# Patient Record
Sex: Female | Born: 1950 | Race: Black or African American | Hispanic: No | Smoking: Former smoker
Health system: Southern US, Community
[De-identification: ages and names within clinical notes are randomized; demographics above are authoritative.]

## PROBLEM LIST (undated history)

## (undated) DIAGNOSIS — J189 Pneumonia, unspecified organism: Secondary | ICD-10-CM

## (undated) DIAGNOSIS — N186 End stage renal disease: Secondary | ICD-10-CM

## (undated) DIAGNOSIS — Z9289 Personal history of other medical treatment: Secondary | ICD-10-CM

## (undated) DIAGNOSIS — D649 Anemia, unspecified: Secondary | ICD-10-CM

## (undated) DIAGNOSIS — N184 Chronic kidney disease, stage 4 (severe): Secondary | ICD-10-CM

## (undated) DIAGNOSIS — K219 Gastro-esophageal reflux disease without esophagitis: Secondary | ICD-10-CM

## (undated) DIAGNOSIS — J449 Chronic obstructive pulmonary disease, unspecified: Secondary | ICD-10-CM

## (undated) DIAGNOSIS — I10 Essential (primary) hypertension: Secondary | ICD-10-CM

## (undated) DIAGNOSIS — M069 Rheumatoid arthritis, unspecified: Secondary | ICD-10-CM

## (undated) HISTORY — PX: VOLVULUS REDUCTION: SHX425

---

## 2017-12-12 DIAGNOSIS — R7989 Other specified abnormal findings of blood chemistry: Secondary | ICD-10-CM | POA: Diagnosis not present

## 2017-12-12 DIAGNOSIS — D649 Anemia, unspecified: Secondary | ICD-10-CM | POA: Diagnosis not present

## 2017-12-12 DIAGNOSIS — Z6822 Body mass index (BMI) 22.0-22.9, adult: Secondary | ICD-10-CM | POA: Diagnosis not present

## 2017-12-12 DIAGNOSIS — M47812 Spondylosis without myelopathy or radiculopathy, cervical region: Secondary | ICD-10-CM | POA: Diagnosis not present

## 2017-12-12 DIAGNOSIS — D691 Qualitative platelet defects: Secondary | ICD-10-CM | POA: Diagnosis not present

## 2017-12-12 DIAGNOSIS — I10 Essential (primary) hypertension: Secondary | ICD-10-CM | POA: Diagnosis not present

## 2017-12-12 DIAGNOSIS — E782 Mixed hyperlipidemia: Secondary | ICD-10-CM | POA: Diagnosis not present

## 2017-12-12 DIAGNOSIS — E611 Iron deficiency: Secondary | ICD-10-CM | POA: Diagnosis not present

## 2017-12-29 DIAGNOSIS — Z6823 Body mass index (BMI) 23.0-23.9, adult: Secondary | ICD-10-CM | POA: Diagnosis not present

## 2017-12-29 DIAGNOSIS — M47812 Spondylosis without myelopathy or radiculopathy, cervical region: Secondary | ICD-10-CM | POA: Diagnosis not present

## 2017-12-29 DIAGNOSIS — I10 Essential (primary) hypertension: Secondary | ICD-10-CM | POA: Diagnosis not present

## 2017-12-29 DIAGNOSIS — M542 Cervicalgia: Secondary | ICD-10-CM | POA: Diagnosis not present

## 2018-01-06 DIAGNOSIS — K8020 Calculus of gallbladder without cholecystitis without obstruction: Secondary | ICD-10-CM | POA: Diagnosis not present

## 2018-01-06 DIAGNOSIS — R945 Abnormal results of liver function studies: Secondary | ICD-10-CM | POA: Diagnosis not present

## 2018-01-06 DIAGNOSIS — N289 Disorder of kidney and ureter, unspecified: Secondary | ICD-10-CM | POA: Diagnosis not present

## 2018-01-12 DIAGNOSIS — E782 Mixed hyperlipidemia: Secondary | ICD-10-CM | POA: Diagnosis not present

## 2018-01-12 DIAGNOSIS — D649 Anemia, unspecified: Secondary | ICD-10-CM | POA: Diagnosis not present

## 2018-01-12 DIAGNOSIS — I10 Essential (primary) hypertension: Secondary | ICD-10-CM | POA: Diagnosis not present

## 2018-01-13 DIAGNOSIS — N281 Cyst of kidney, acquired: Secondary | ICD-10-CM | POA: Diagnosis not present

## 2018-01-13 DIAGNOSIS — N200 Calculus of kidney: Secondary | ICD-10-CM | POA: Diagnosis not present

## 2018-01-13 DIAGNOSIS — N289 Disorder of kidney and ureter, unspecified: Secondary | ICD-10-CM | POA: Diagnosis not present

## 2018-06-14 DIAGNOSIS — I10 Essential (primary) hypertension: Secondary | ICD-10-CM | POA: Diagnosis not present

## 2018-06-14 DIAGNOSIS — N281 Cyst of kidney, acquired: Secondary | ICD-10-CM | POA: Diagnosis not present

## 2018-06-14 DIAGNOSIS — Z6822 Body mass index (BMI) 22.0-22.9, adult: Secondary | ICD-10-CM | POA: Diagnosis not present

## 2018-06-14 DIAGNOSIS — D649 Anemia, unspecified: Secondary | ICD-10-CM | POA: Diagnosis not present

## 2018-06-14 DIAGNOSIS — Z0001 Encounter for general adult medical examination with abnormal findings: Secondary | ICD-10-CM | POA: Diagnosis not present

## 2018-06-14 DIAGNOSIS — Z1212 Encounter for screening for malignant neoplasm of rectum: Secondary | ICD-10-CM | POA: Diagnosis not present

## 2018-06-14 DIAGNOSIS — M47812 Spondylosis without myelopathy or radiculopathy, cervical region: Secondary | ICD-10-CM | POA: Diagnosis not present

## 2018-06-29 DIAGNOSIS — R921 Mammographic calcification found on diagnostic imaging of breast: Secondary | ICD-10-CM | POA: Diagnosis not present

## 2018-06-29 DIAGNOSIS — Z1231 Encounter for screening mammogram for malignant neoplasm of breast: Secondary | ICD-10-CM | POA: Diagnosis not present

## 2018-08-09 DIAGNOSIS — R921 Mammographic calcification found on diagnostic imaging of breast: Secondary | ICD-10-CM | POA: Diagnosis not present

## 2018-08-23 DIAGNOSIS — E7800 Pure hypercholesterolemia, unspecified: Secondary | ICD-10-CM | POA: Diagnosis not present

## 2018-11-01 DIAGNOSIS — E860 Dehydration: Secondary | ICD-10-CM | POA: Diagnosis not present

## 2018-11-01 DIAGNOSIS — A045 Campylobacter enteritis: Secondary | ICD-10-CM | POA: Diagnosis not present

## 2018-11-01 DIAGNOSIS — R197 Diarrhea, unspecified: Secondary | ICD-10-CM | POA: Diagnosis not present

## 2018-11-01 DIAGNOSIS — N200 Calculus of kidney: Secondary | ICD-10-CM | POA: Diagnosis not present

## 2018-11-01 DIAGNOSIS — D649 Anemia, unspecified: Secondary | ICD-10-CM | POA: Diagnosis not present

## 2018-11-01 DIAGNOSIS — D473 Essential (hemorrhagic) thrombocythemia: Secondary | ICD-10-CM | POA: Diagnosis not present

## 2018-11-01 DIAGNOSIS — M545 Low back pain: Secondary | ICD-10-CM | POA: Diagnosis not present

## 2018-11-01 DIAGNOSIS — D72829 Elevated white blood cell count, unspecified: Secondary | ICD-10-CM | POA: Diagnosis not present

## 2018-11-01 DIAGNOSIS — A0472 Enterocolitis due to Clostridium difficile, not specified as recurrent: Secondary | ICD-10-CM | POA: Diagnosis not present

## 2018-11-01 DIAGNOSIS — Z87891 Personal history of nicotine dependence: Secondary | ICD-10-CM | POA: Diagnosis not present

## 2018-11-01 DIAGNOSIS — N281 Cyst of kidney, acquired: Secondary | ICD-10-CM | POA: Diagnosis not present

## 2018-11-01 DIAGNOSIS — M19041 Primary osteoarthritis, right hand: Secondary | ICD-10-CM | POA: Diagnosis not present

## 2018-11-01 DIAGNOSIS — I10 Essential (primary) hypertension: Secondary | ICD-10-CM | POA: Diagnosis not present

## 2018-11-01 DIAGNOSIS — M19042 Primary osteoarthritis, left hand: Secondary | ICD-10-CM | POA: Diagnosis not present

## 2018-11-01 DIAGNOSIS — R748 Abnormal levels of other serum enzymes: Secondary | ICD-10-CM | POA: Diagnosis not present

## 2018-11-01 DIAGNOSIS — E785 Hyperlipidemia, unspecified: Secondary | ICD-10-CM | POA: Diagnosis not present

## 2018-11-02 DIAGNOSIS — A0472 Enterocolitis due to Clostridium difficile, not specified as recurrent: Secondary | ICD-10-CM | POA: Diagnosis not present

## 2018-11-02 DIAGNOSIS — D72829 Elevated white blood cell count, unspecified: Secondary | ICD-10-CM | POA: Diagnosis not present

## 2018-11-03 DIAGNOSIS — A0472 Enterocolitis due to Clostridium difficile, not specified as recurrent: Secondary | ICD-10-CM | POA: Diagnosis not present

## 2018-11-03 DIAGNOSIS — D72829 Elevated white blood cell count, unspecified: Secondary | ICD-10-CM | POA: Diagnosis not present

## 2018-11-15 DIAGNOSIS — D638 Anemia in other chronic diseases classified elsewhere: Secondary | ICD-10-CM | POA: Diagnosis not present

## 2018-11-15 DIAGNOSIS — A0472 Enterocolitis due to Clostridium difficile, not specified as recurrent: Secondary | ICD-10-CM | POA: Diagnosis not present

## 2018-11-15 DIAGNOSIS — Z6820 Body mass index (BMI) 20.0-20.9, adult: Secondary | ICD-10-CM | POA: Diagnosis not present

## 2018-11-24 DIAGNOSIS — R7989 Other specified abnormal findings of blood chemistry: Secondary | ICD-10-CM | POA: Insufficient documentation

## 2018-11-24 DIAGNOSIS — M1990 Unspecified osteoarthritis, unspecified site: Secondary | ICD-10-CM | POA: Diagnosis not present

## 2018-11-24 DIAGNOSIS — D649 Anemia, unspecified: Secondary | ICD-10-CM | POA: Diagnosis not present

## 2018-11-24 DIAGNOSIS — D473 Essential (hemorrhagic) thrombocythemia: Secondary | ICD-10-CM | POA: Diagnosis not present

## 2018-11-24 DIAGNOSIS — D75839 Thrombocytosis, unspecified: Secondary | ICD-10-CM | POA: Insufficient documentation

## 2018-12-03 DIAGNOSIS — A0472 Enterocolitis due to Clostridium difficile, not specified as recurrent: Secondary | ICD-10-CM | POA: Diagnosis not present

## 2018-12-03 DIAGNOSIS — D638 Anemia in other chronic diseases classified elsewhere: Secondary | ICD-10-CM | POA: Diagnosis not present

## 2018-12-08 DIAGNOSIS — D649 Anemia, unspecified: Secondary | ICD-10-CM | POA: Diagnosis not present

## 2018-12-08 DIAGNOSIS — D473 Essential (hemorrhagic) thrombocythemia: Secondary | ICD-10-CM | POA: Diagnosis not present

## 2018-12-08 DIAGNOSIS — R7989 Other specified abnormal findings of blood chemistry: Secondary | ICD-10-CM | POA: Diagnosis not present

## 2018-12-14 DIAGNOSIS — I10 Essential (primary) hypertension: Secondary | ICD-10-CM | POA: Diagnosis not present

## 2018-12-14 DIAGNOSIS — Z1389 Encounter for screening for other disorder: Secondary | ICD-10-CM | POA: Diagnosis not present

## 2018-12-14 DIAGNOSIS — D649 Anemia, unspecified: Secondary | ICD-10-CM | POA: Diagnosis not present

## 2018-12-14 DIAGNOSIS — M47812 Spondylosis without myelopathy or radiculopathy, cervical region: Secondary | ICD-10-CM | POA: Diagnosis not present

## 2018-12-14 DIAGNOSIS — N281 Cyst of kidney, acquired: Secondary | ICD-10-CM | POA: Diagnosis not present

## 2018-12-14 DIAGNOSIS — Z6820 Body mass index (BMI) 20.0-20.9, adult: Secondary | ICD-10-CM | POA: Diagnosis not present

## 2018-12-19 DIAGNOSIS — D473 Essential (hemorrhagic) thrombocythemia: Secondary | ICD-10-CM | POA: Diagnosis not present

## 2018-12-19 DIAGNOSIS — D72829 Elevated white blood cell count, unspecified: Secondary | ICD-10-CM | POA: Diagnosis not present

## 2019-01-03 DIAGNOSIS — D473 Essential (hemorrhagic) thrombocythemia: Secondary | ICD-10-CM | POA: Diagnosis not present

## 2019-01-03 DIAGNOSIS — R7989 Other specified abnormal findings of blood chemistry: Secondary | ICD-10-CM | POA: Diagnosis not present

## 2019-01-03 DIAGNOSIS — M1990 Unspecified osteoarthritis, unspecified site: Secondary | ICD-10-CM | POA: Diagnosis not present

## 2019-01-15 DIAGNOSIS — M2550 Pain in unspecified joint: Secondary | ICD-10-CM | POA: Diagnosis not present

## 2019-01-15 DIAGNOSIS — M064 Inflammatory polyarthropathy: Secondary | ICD-10-CM | POA: Diagnosis not present

## 2019-01-15 DIAGNOSIS — M542 Cervicalgia: Secondary | ICD-10-CM | POA: Diagnosis not present

## 2019-01-15 DIAGNOSIS — Z6823 Body mass index (BMI) 23.0-23.9, adult: Secondary | ICD-10-CM | POA: Diagnosis not present

## 2019-01-15 DIAGNOSIS — R5383 Other fatigue: Secondary | ICD-10-CM | POA: Diagnosis not present

## 2019-02-01 DIAGNOSIS — R197 Diarrhea, unspecified: Secondary | ICD-10-CM | POA: Diagnosis not present

## 2019-02-01 DIAGNOSIS — Z23 Encounter for immunization: Secondary | ICD-10-CM | POA: Diagnosis not present

## 2019-02-02 DIAGNOSIS — R197 Diarrhea, unspecified: Secondary | ICD-10-CM | POA: Diagnosis not present

## 2019-02-12 DIAGNOSIS — R7989 Other specified abnormal findings of blood chemistry: Secondary | ICD-10-CM | POA: Diagnosis not present

## 2019-02-12 DIAGNOSIS — D649 Anemia, unspecified: Secondary | ICD-10-CM | POA: Diagnosis not present

## 2019-02-12 DIAGNOSIS — D473 Essential (hemorrhagic) thrombocythemia: Secondary | ICD-10-CM | POA: Diagnosis not present

## 2019-02-13 DIAGNOSIS — M150 Primary generalized (osteo)arthritis: Secondary | ICD-10-CM | POA: Diagnosis not present

## 2019-02-13 DIAGNOSIS — M0609 Rheumatoid arthritis without rheumatoid factor, multiple sites: Secondary | ICD-10-CM | POA: Diagnosis not present

## 2019-02-13 DIAGNOSIS — M5030 Other cervical disc degeneration, unspecified cervical region: Secondary | ICD-10-CM | POA: Diagnosis not present

## 2019-02-16 DIAGNOSIS — M1990 Unspecified osteoarthritis, unspecified site: Secondary | ICD-10-CM | POA: Diagnosis not present

## 2019-02-16 DIAGNOSIS — K2100 Gastro-esophageal reflux disease with esophagitis, without bleeding: Secondary | ICD-10-CM | POA: Diagnosis not present

## 2019-02-16 DIAGNOSIS — R7989 Other specified abnormal findings of blood chemistry: Secondary | ICD-10-CM | POA: Diagnosis not present

## 2019-02-16 DIAGNOSIS — D473 Essential (hemorrhagic) thrombocythemia: Secondary | ICD-10-CM | POA: Diagnosis not present

## 2019-02-22 DIAGNOSIS — K2100 Gastro-esophageal reflux disease with esophagitis, without bleeding: Secondary | ICD-10-CM | POA: Insufficient documentation

## 2019-04-02 DIAGNOSIS — I10 Essential (primary) hypertension: Secondary | ICD-10-CM | POA: Diagnosis not present

## 2019-04-02 DIAGNOSIS — E7800 Pure hypercholesterolemia, unspecified: Secondary | ICD-10-CM | POA: Diagnosis not present

## 2019-04-16 DIAGNOSIS — I10 Essential (primary) hypertension: Secondary | ICD-10-CM | POA: Diagnosis not present

## 2019-04-16 DIAGNOSIS — D638 Anemia in other chronic diseases classified elsewhere: Secondary | ICD-10-CM | POA: Diagnosis not present

## 2019-04-16 DIAGNOSIS — E782 Mixed hyperlipidemia: Secondary | ICD-10-CM | POA: Diagnosis not present

## 2019-04-16 DIAGNOSIS — E7800 Pure hypercholesterolemia, unspecified: Secondary | ICD-10-CM | POA: Diagnosis not present

## 2019-04-18 DIAGNOSIS — M47812 Spondylosis without myelopathy or radiculopathy, cervical region: Secondary | ICD-10-CM | POA: Diagnosis not present

## 2019-04-18 DIAGNOSIS — Z23 Encounter for immunization: Secondary | ICD-10-CM | POA: Diagnosis not present

## 2019-04-18 DIAGNOSIS — N281 Cyst of kidney, acquired: Secondary | ICD-10-CM | POA: Diagnosis not present

## 2019-04-18 DIAGNOSIS — I10 Essential (primary) hypertension: Secondary | ICD-10-CM | POA: Diagnosis not present

## 2019-04-18 DIAGNOSIS — D649 Anemia, unspecified: Secondary | ICD-10-CM | POA: Diagnosis not present

## 2019-05-03 DIAGNOSIS — E7800 Pure hypercholesterolemia, unspecified: Secondary | ICD-10-CM | POA: Diagnosis not present

## 2019-05-03 DIAGNOSIS — I10 Essential (primary) hypertension: Secondary | ICD-10-CM | POA: Diagnosis not present

## 2019-06-01 DIAGNOSIS — D649 Anemia, unspecified: Secondary | ICD-10-CM | POA: Diagnosis not present

## 2019-06-01 DIAGNOSIS — M47812 Spondylosis without myelopathy or radiculopathy, cervical region: Secondary | ICD-10-CM | POA: Diagnosis not present

## 2019-06-01 DIAGNOSIS — I10 Essential (primary) hypertension: Secondary | ICD-10-CM | POA: Diagnosis not present

## 2019-06-13 DIAGNOSIS — D473 Essential (hemorrhagic) thrombocythemia: Secondary | ICD-10-CM | POA: Diagnosis not present

## 2019-06-13 DIAGNOSIS — R7989 Other specified abnormal findings of blood chemistry: Secondary | ICD-10-CM | POA: Diagnosis not present

## 2019-06-14 DIAGNOSIS — D649 Anemia, unspecified: Secondary | ICD-10-CM | POA: Diagnosis not present

## 2019-06-14 DIAGNOSIS — I10 Essential (primary) hypertension: Secondary | ICD-10-CM | POA: Diagnosis not present

## 2019-06-14 DIAGNOSIS — M47812 Spondylosis without myelopathy or radiculopathy, cervical region: Secondary | ICD-10-CM | POA: Diagnosis not present

## 2019-06-20 DIAGNOSIS — K2100 Gastro-esophageal reflux disease with esophagitis, without bleeding: Secondary | ICD-10-CM | POA: Diagnosis not present

## 2019-06-20 DIAGNOSIS — R7989 Other specified abnormal findings of blood chemistry: Secondary | ICD-10-CM | POA: Diagnosis not present

## 2019-06-20 DIAGNOSIS — M1990 Unspecified osteoarthritis, unspecified site: Secondary | ICD-10-CM | POA: Diagnosis not present

## 2019-06-20 DIAGNOSIS — D473 Essential (hemorrhagic) thrombocythemia: Secondary | ICD-10-CM | POA: Diagnosis not present

## 2019-08-01 DIAGNOSIS — I10 Essential (primary) hypertension: Secondary | ICD-10-CM | POA: Diagnosis not present

## 2019-08-01 DIAGNOSIS — E7849 Other hyperlipidemia: Secondary | ICD-10-CM | POA: Diagnosis not present

## 2019-08-09 DIAGNOSIS — D638 Anemia in other chronic diseases classified elsewhere: Secondary | ICD-10-CM | POA: Diagnosis not present

## 2019-08-09 DIAGNOSIS — N289 Disorder of kidney and ureter, unspecified: Secondary | ICD-10-CM | POA: Diagnosis not present

## 2019-08-09 DIAGNOSIS — I10 Essential (primary) hypertension: Secondary | ICD-10-CM | POA: Diagnosis not present

## 2019-08-09 DIAGNOSIS — E782 Mixed hyperlipidemia: Secondary | ICD-10-CM | POA: Diagnosis not present

## 2019-08-09 DIAGNOSIS — E7800 Pure hypercholesterolemia, unspecified: Secondary | ICD-10-CM | POA: Diagnosis not present

## 2019-08-13 DIAGNOSIS — Z0001 Encounter for general adult medical examination with abnormal findings: Secondary | ICD-10-CM | POA: Diagnosis not present

## 2019-08-13 DIAGNOSIS — N281 Cyst of kidney, acquired: Secondary | ICD-10-CM | POA: Diagnosis not present

## 2019-08-13 DIAGNOSIS — D649 Anemia, unspecified: Secondary | ICD-10-CM | POA: Diagnosis not present

## 2019-08-13 DIAGNOSIS — Z23 Encounter for immunization: Secondary | ICD-10-CM | POA: Diagnosis not present

## 2019-08-13 DIAGNOSIS — Z1212 Encounter for screening for malignant neoplasm of rectum: Secondary | ICD-10-CM | POA: Diagnosis not present

## 2019-08-13 DIAGNOSIS — I10 Essential (primary) hypertension: Secondary | ICD-10-CM | POA: Diagnosis not present

## 2019-08-13 DIAGNOSIS — Z6820 Body mass index (BMI) 20.0-20.9, adult: Secondary | ICD-10-CM | POA: Diagnosis not present

## 2019-08-13 DIAGNOSIS — M47812 Spondylosis without myelopathy or radiculopathy, cervical region: Secondary | ICD-10-CM | POA: Diagnosis not present

## 2019-08-31 DIAGNOSIS — I10 Essential (primary) hypertension: Secondary | ICD-10-CM | POA: Diagnosis not present

## 2019-08-31 DIAGNOSIS — E7849 Other hyperlipidemia: Secondary | ICD-10-CM | POA: Diagnosis not present

## 2019-09-05 DIAGNOSIS — M150 Primary generalized (osteo)arthritis: Secondary | ICD-10-CM | POA: Diagnosis not present

## 2019-09-05 DIAGNOSIS — M0609 Rheumatoid arthritis without rheumatoid factor, multiple sites: Secondary | ICD-10-CM | POA: Diagnosis not present

## 2019-09-05 DIAGNOSIS — M2550 Pain in unspecified joint: Secondary | ICD-10-CM | POA: Diagnosis not present

## 2019-09-05 DIAGNOSIS — Z6821 Body mass index (BMI) 21.0-21.9, adult: Secondary | ICD-10-CM | POA: Diagnosis not present

## 2019-09-05 DIAGNOSIS — D473 Essential (hemorrhagic) thrombocythemia: Secondary | ICD-10-CM | POA: Diagnosis not present

## 2019-10-22 DIAGNOSIS — Z1231 Encounter for screening mammogram for malignant neoplasm of breast: Secondary | ICD-10-CM | POA: Diagnosis not present

## 2019-10-31 DIAGNOSIS — E7849 Other hyperlipidemia: Secondary | ICD-10-CM | POA: Diagnosis not present

## 2019-10-31 DIAGNOSIS — N281 Cyst of kidney, acquired: Secondary | ICD-10-CM | POA: Diagnosis not present

## 2019-10-31 DIAGNOSIS — I10 Essential (primary) hypertension: Secondary | ICD-10-CM | POA: Diagnosis not present

## 2019-11-07 DIAGNOSIS — R921 Mammographic calcification found on diagnostic imaging of breast: Secondary | ICD-10-CM | POA: Diagnosis not present

## 2019-12-06 DIAGNOSIS — E7800 Pure hypercholesterolemia, unspecified: Secondary | ICD-10-CM | POA: Diagnosis not present

## 2019-12-06 DIAGNOSIS — E782 Mixed hyperlipidemia: Secondary | ICD-10-CM | POA: Diagnosis not present

## 2019-12-06 DIAGNOSIS — D638 Anemia in other chronic diseases classified elsewhere: Secondary | ICD-10-CM | POA: Diagnosis not present

## 2019-12-06 DIAGNOSIS — I10 Essential (primary) hypertension: Secondary | ICD-10-CM | POA: Diagnosis not present

## 2019-12-10 DIAGNOSIS — M85851 Other specified disorders of bone density and structure, right thigh: Secondary | ICD-10-CM | POA: Diagnosis not present

## 2019-12-10 DIAGNOSIS — M85852 Other specified disorders of bone density and structure, left thigh: Secondary | ICD-10-CM | POA: Diagnosis not present

## 2019-12-10 DIAGNOSIS — M810 Age-related osteoporosis without current pathological fracture: Secondary | ICD-10-CM | POA: Diagnosis not present

## 2019-12-11 DIAGNOSIS — M150 Primary generalized (osteo)arthritis: Secondary | ICD-10-CM | POA: Diagnosis not present

## 2019-12-11 DIAGNOSIS — Z79899 Other long term (current) drug therapy: Secondary | ICD-10-CM | POA: Diagnosis not present

## 2019-12-11 DIAGNOSIS — Z6823 Body mass index (BMI) 23.0-23.9, adult: Secondary | ICD-10-CM | POA: Diagnosis not present

## 2019-12-11 DIAGNOSIS — M2550 Pain in unspecified joint: Secondary | ICD-10-CM | POA: Diagnosis not present

## 2019-12-11 DIAGNOSIS — M0609 Rheumatoid arthritis without rheumatoid factor, multiple sites: Secondary | ICD-10-CM | POA: Diagnosis not present

## 2019-12-12 DIAGNOSIS — D649 Anemia, unspecified: Secondary | ICD-10-CM | POA: Diagnosis not present

## 2019-12-12 DIAGNOSIS — M47812 Spondylosis without myelopathy or radiculopathy, cervical region: Secondary | ICD-10-CM | POA: Diagnosis not present

## 2019-12-12 DIAGNOSIS — I10 Essential (primary) hypertension: Secondary | ICD-10-CM | POA: Diagnosis not present

## 2019-12-12 DIAGNOSIS — M069 Rheumatoid arthritis, unspecified: Secondary | ICD-10-CM | POA: Diagnosis not present

## 2019-12-12 DIAGNOSIS — Z6821 Body mass index (BMI) 21.0-21.9, adult: Secondary | ICD-10-CM | POA: Diagnosis not present

## 2019-12-12 DIAGNOSIS — N281 Cyst of kidney, acquired: Secondary | ICD-10-CM | POA: Diagnosis not present

## 2019-12-18 DIAGNOSIS — R928 Other abnormal and inconclusive findings on diagnostic imaging of breast: Secondary | ICD-10-CM | POA: Insufficient documentation

## 2019-12-18 DIAGNOSIS — R921 Mammographic calcification found on diagnostic imaging of breast: Secondary | ICD-10-CM | POA: Diagnosis not present

## 2019-12-18 DIAGNOSIS — R920 Mammographic microcalcification found on diagnostic imaging of breast: Secondary | ICD-10-CM | POA: Diagnosis not present

## 2019-12-18 DIAGNOSIS — R7989 Other specified abnormal findings of blood chemistry: Secondary | ICD-10-CM | POA: Diagnosis not present

## 2019-12-18 DIAGNOSIS — D649 Anemia, unspecified: Secondary | ICD-10-CM | POA: Diagnosis not present

## 2019-12-18 DIAGNOSIS — D473 Essential (hemorrhagic) thrombocythemia: Secondary | ICD-10-CM | POA: Diagnosis not present

## 2019-12-24 ENCOUNTER — Other Ambulatory Visit: Payer: Self-pay | Admitting: Oncology

## 2019-12-24 DIAGNOSIS — R928 Other abnormal and inconclusive findings on diagnostic imaging of breast: Secondary | ICD-10-CM

## 2020-01-08 DIAGNOSIS — R920 Mammographic microcalcification found on diagnostic imaging of breast: Secondary | ICD-10-CM | POA: Insufficient documentation

## 2020-01-09 ENCOUNTER — Other Ambulatory Visit: Payer: Self-pay

## 2020-01-09 ENCOUNTER — Ambulatory Visit
Admission: RE | Admit: 2020-01-09 | Discharge: 2020-01-09 | Disposition: A | Discharge status: Home / Self Care | Payer: Medicare Other | Source: Ambulatory Visit | Attending: Oncology | Admitting: Oncology

## 2020-01-09 DIAGNOSIS — R928 Other abnormal and inconclusive findings on diagnostic imaging of breast: Secondary | ICD-10-CM

## 2020-01-09 DIAGNOSIS — R921 Mammographic calcification found on diagnostic imaging of breast: Secondary | ICD-10-CM | POA: Diagnosis not present

## 2020-01-09 DIAGNOSIS — D0512 Intraductal carcinoma in situ of left breast: Secondary | ICD-10-CM | POA: Diagnosis not present

## 2020-01-14 DIAGNOSIS — D0512 Intraductal carcinoma in situ of left breast: Secondary | ICD-10-CM | POA: Diagnosis not present

## 2020-01-14 DIAGNOSIS — Z7189 Other specified counseling: Secondary | ICD-10-CM | POA: Diagnosis not present

## 2020-01-14 DIAGNOSIS — M8589 Other specified disorders of bone density and structure, multiple sites: Secondary | ICD-10-CM | POA: Diagnosis not present

## 2020-01-14 DIAGNOSIS — D473 Essential (hemorrhagic) thrombocythemia: Secondary | ICD-10-CM | POA: Diagnosis not present

## 2020-01-14 DIAGNOSIS — R928 Other abnormal and inconclusive findings on diagnostic imaging of breast: Secondary | ICD-10-CM | POA: Diagnosis not present

## 2020-01-23 DIAGNOSIS — Z7189 Other specified counseling: Secondary | ICD-10-CM | POA: Insufficient documentation

## 2020-01-23 DIAGNOSIS — M8589 Other specified disorders of bone density and structure, multiple sites: Secondary | ICD-10-CM | POA: Insufficient documentation

## 2020-01-31 DIAGNOSIS — E7849 Other hyperlipidemia: Secondary | ICD-10-CM | POA: Diagnosis not present

## 2020-01-31 DIAGNOSIS — N281 Cyst of kidney, acquired: Secondary | ICD-10-CM | POA: Diagnosis not present

## 2020-01-31 DIAGNOSIS — I10 Essential (primary) hypertension: Secondary | ICD-10-CM | POA: Diagnosis not present

## 2020-02-01 DIAGNOSIS — D0512 Intraductal carcinoma in situ of left breast: Secondary | ICD-10-CM | POA: Diagnosis not present

## 2020-02-01 DIAGNOSIS — Z7189 Other specified counseling: Secondary | ICD-10-CM | POA: Diagnosis not present

## 2020-02-05 DIAGNOSIS — D0512 Intraductal carcinoma in situ of left breast: Secondary | ICD-10-CM | POA: Diagnosis not present

## 2020-02-12 DIAGNOSIS — D0512 Intraductal carcinoma in situ of left breast: Secondary | ICD-10-CM | POA: Diagnosis not present

## 2020-02-25 DIAGNOSIS — Z01818 Encounter for other preprocedural examination: Secondary | ICD-10-CM | POA: Diagnosis not present

## 2020-02-27 DIAGNOSIS — Z780 Asymptomatic menopausal state: Secondary | ICD-10-CM | POA: Diagnosis not present

## 2020-02-27 DIAGNOSIS — Z7982 Long term (current) use of aspirin: Secondary | ICD-10-CM | POA: Diagnosis not present

## 2020-02-27 DIAGNOSIS — E785 Hyperlipidemia, unspecified: Secondary | ICD-10-CM | POA: Diagnosis not present

## 2020-02-27 DIAGNOSIS — Z87891 Personal history of nicotine dependence: Secondary | ICD-10-CM | POA: Diagnosis not present

## 2020-02-27 DIAGNOSIS — N62 Hypertrophy of breast: Secondary | ICD-10-CM | POA: Diagnosis not present

## 2020-02-27 DIAGNOSIS — N6320 Unspecified lump in the left breast, unspecified quadrant: Secondary | ICD-10-CM | POA: Diagnosis not present

## 2020-02-27 DIAGNOSIS — I10 Essential (primary) hypertension: Secondary | ICD-10-CM | POA: Diagnosis not present

## 2020-02-27 DIAGNOSIS — Z170 Estrogen receptor positive status [ER+]: Secondary | ICD-10-CM | POA: Diagnosis not present

## 2020-02-27 DIAGNOSIS — Z7952 Long term (current) use of systemic steroids: Secondary | ICD-10-CM | POA: Diagnosis not present

## 2020-02-27 DIAGNOSIS — D0512 Intraductal carcinoma in situ of left breast: Secondary | ICD-10-CM | POA: Diagnosis not present

## 2020-02-27 DIAGNOSIS — Z803 Family history of malignant neoplasm of breast: Secondary | ICD-10-CM | POA: Diagnosis not present

## 2020-03-01 DIAGNOSIS — N281 Cyst of kidney, acquired: Secondary | ICD-10-CM | POA: Diagnosis not present

## 2020-03-01 DIAGNOSIS — I10 Essential (primary) hypertension: Secondary | ICD-10-CM | POA: Diagnosis not present

## 2020-03-01 DIAGNOSIS — E7849 Other hyperlipidemia: Secondary | ICD-10-CM | POA: Diagnosis not present

## 2020-03-17 DIAGNOSIS — Z01818 Encounter for other preprocedural examination: Secondary | ICD-10-CM | POA: Diagnosis not present

## 2020-03-17 DIAGNOSIS — D0512 Intraductal carcinoma in situ of left breast: Secondary | ICD-10-CM | POA: Diagnosis not present

## 2020-03-19 DIAGNOSIS — Z9889 Other specified postprocedural states: Secondary | ICD-10-CM | POA: Diagnosis not present

## 2020-03-19 DIAGNOSIS — R920 Mammographic microcalcification found on diagnostic imaging of breast: Secondary | ICD-10-CM | POA: Diagnosis not present

## 2020-03-19 DIAGNOSIS — I10 Essential (primary) hypertension: Secondary | ICD-10-CM | POA: Diagnosis not present

## 2020-03-19 DIAGNOSIS — Z87891 Personal history of nicotine dependence: Secondary | ICD-10-CM | POA: Diagnosis not present

## 2020-03-19 DIAGNOSIS — M069 Rheumatoid arthritis, unspecified: Secondary | ICD-10-CM | POA: Diagnosis not present

## 2020-03-19 DIAGNOSIS — D0512 Intraductal carcinoma in situ of left breast: Secondary | ICD-10-CM | POA: Diagnosis not present

## 2020-03-19 DIAGNOSIS — E785 Hyperlipidemia, unspecified: Secondary | ICD-10-CM | POA: Diagnosis not present

## 2020-03-19 DIAGNOSIS — Z79899 Other long term (current) drug therapy: Secondary | ICD-10-CM | POA: Diagnosis not present

## 2020-03-19 DIAGNOSIS — Z7952 Long term (current) use of systemic steroids: Secondary | ICD-10-CM | POA: Diagnosis not present

## 2020-03-19 DIAGNOSIS — Z9012 Acquired absence of left breast and nipple: Secondary | ICD-10-CM | POA: Diagnosis not present

## 2020-03-19 DIAGNOSIS — D649 Anemia, unspecified: Secondary | ICD-10-CM | POA: Diagnosis not present

## 2020-03-19 DIAGNOSIS — N61 Mastitis without abscess: Secondary | ICD-10-CM | POA: Diagnosis not present

## 2020-03-19 DIAGNOSIS — R921 Mammographic calcification found on diagnostic imaging of breast: Secondary | ICD-10-CM | POA: Diagnosis not present

## 2020-04-04 DIAGNOSIS — Z9889 Other specified postprocedural states: Secondary | ICD-10-CM | POA: Diagnosis not present

## 2020-04-04 DIAGNOSIS — C50512 Malignant neoplasm of lower-outer quadrant of left female breast: Secondary | ICD-10-CM | POA: Diagnosis not present

## 2020-04-04 DIAGNOSIS — R920 Mammographic microcalcification found on diagnostic imaging of breast: Secondary | ICD-10-CM | POA: Diagnosis not present

## 2020-04-04 DIAGNOSIS — Z170 Estrogen receptor positive status [ER+]: Secondary | ICD-10-CM | POA: Diagnosis not present

## 2020-04-04 DIAGNOSIS — D499 Neoplasm of unspecified behavior of unspecified site: Secondary | ICD-10-CM | POA: Diagnosis not present

## 2020-04-04 DIAGNOSIS — R928 Other abnormal and inconclusive findings on diagnostic imaging of breast: Secondary | ICD-10-CM | POA: Diagnosis not present

## 2020-04-04 DIAGNOSIS — Z7189 Other specified counseling: Secondary | ICD-10-CM | POA: Diagnosis not present

## 2020-04-04 DIAGNOSIS — D0512 Intraductal carcinoma in situ of left breast: Secondary | ICD-10-CM | POA: Diagnosis not present

## 2020-04-14 DIAGNOSIS — D0512 Intraductal carcinoma in situ of left breast: Secondary | ICD-10-CM | POA: Diagnosis not present

## 2020-04-14 DIAGNOSIS — Z510 Encounter for antineoplastic radiation therapy: Secondary | ICD-10-CM | POA: Diagnosis not present

## 2020-04-16 DIAGNOSIS — D499 Neoplasm of unspecified behavior of unspecified site: Secondary | ICD-10-CM | POA: Insufficient documentation

## 2020-04-16 DIAGNOSIS — Z9889 Other specified postprocedural states: Secondary | ICD-10-CM | POA: Insufficient documentation

## 2020-04-17 DIAGNOSIS — Z510 Encounter for antineoplastic radiation therapy: Secondary | ICD-10-CM | POA: Diagnosis not present

## 2020-04-17 DIAGNOSIS — D0512 Intraductal carcinoma in situ of left breast: Secondary | ICD-10-CM | POA: Diagnosis not present

## 2020-04-21 DIAGNOSIS — D0512 Intraductal carcinoma in situ of left breast: Secondary | ICD-10-CM | POA: Diagnosis not present

## 2020-04-21 DIAGNOSIS — Z510 Encounter for antineoplastic radiation therapy: Secondary | ICD-10-CM | POA: Diagnosis not present

## 2020-04-22 DIAGNOSIS — D0512 Intraductal carcinoma in situ of left breast: Secondary | ICD-10-CM | POA: Diagnosis not present

## 2020-04-22 DIAGNOSIS — Z510 Encounter for antineoplastic radiation therapy: Secondary | ICD-10-CM | POA: Diagnosis not present

## 2020-04-23 DIAGNOSIS — Z510 Encounter for antineoplastic radiation therapy: Secondary | ICD-10-CM | POA: Diagnosis not present

## 2020-04-23 DIAGNOSIS — D0512 Intraductal carcinoma in situ of left breast: Secondary | ICD-10-CM | POA: Diagnosis not present

## 2020-04-24 DIAGNOSIS — D0512 Intraductal carcinoma in situ of left breast: Secondary | ICD-10-CM | POA: Diagnosis not present

## 2020-04-24 DIAGNOSIS — Z510 Encounter for antineoplastic radiation therapy: Secondary | ICD-10-CM | POA: Diagnosis not present

## 2020-04-28 DIAGNOSIS — Z510 Encounter for antineoplastic radiation therapy: Secondary | ICD-10-CM | POA: Diagnosis not present

## 2020-04-28 DIAGNOSIS — D0512 Intraductal carcinoma in situ of left breast: Secondary | ICD-10-CM | POA: Diagnosis not present

## 2020-04-29 DIAGNOSIS — D0512 Intraductal carcinoma in situ of left breast: Secondary | ICD-10-CM | POA: Diagnosis not present

## 2020-04-29 DIAGNOSIS — Z510 Encounter for antineoplastic radiation therapy: Secondary | ICD-10-CM | POA: Diagnosis not present

## 2020-04-30 DIAGNOSIS — D0512 Intraductal carcinoma in situ of left breast: Secondary | ICD-10-CM | POA: Diagnosis not present

## 2020-04-30 DIAGNOSIS — Z510 Encounter for antineoplastic radiation therapy: Secondary | ICD-10-CM | POA: Diagnosis not present

## 2020-05-01 DIAGNOSIS — Z510 Encounter for antineoplastic radiation therapy: Secondary | ICD-10-CM | POA: Diagnosis not present

## 2020-05-01 DIAGNOSIS — D0512 Intraductal carcinoma in situ of left breast: Secondary | ICD-10-CM | POA: Diagnosis not present

## 2020-05-02 DIAGNOSIS — N281 Cyst of kidney, acquired: Secondary | ICD-10-CM | POA: Diagnosis not present

## 2020-05-02 DIAGNOSIS — E7849 Other hyperlipidemia: Secondary | ICD-10-CM | POA: Diagnosis not present

## 2020-05-02 DIAGNOSIS — I10 Essential (primary) hypertension: Secondary | ICD-10-CM | POA: Diagnosis not present

## 2020-05-05 DIAGNOSIS — Z510 Encounter for antineoplastic radiation therapy: Secondary | ICD-10-CM | POA: Diagnosis not present

## 2020-05-05 DIAGNOSIS — D0512 Intraductal carcinoma in situ of left breast: Secondary | ICD-10-CM | POA: Diagnosis not present

## 2020-05-06 DIAGNOSIS — D0512 Intraductal carcinoma in situ of left breast: Secondary | ICD-10-CM | POA: Diagnosis not present

## 2020-05-06 DIAGNOSIS — Z510 Encounter for antineoplastic radiation therapy: Secondary | ICD-10-CM | POA: Diagnosis not present

## 2020-05-07 DIAGNOSIS — D0512 Intraductal carcinoma in situ of left breast: Secondary | ICD-10-CM | POA: Diagnosis not present

## 2020-05-07 DIAGNOSIS — Z510 Encounter for antineoplastic radiation therapy: Secondary | ICD-10-CM | POA: Diagnosis not present

## 2020-05-08 DIAGNOSIS — D0512 Intraductal carcinoma in situ of left breast: Secondary | ICD-10-CM | POA: Diagnosis not present

## 2020-05-08 DIAGNOSIS — Z510 Encounter for antineoplastic radiation therapy: Secondary | ICD-10-CM | POA: Diagnosis not present

## 2020-05-09 DIAGNOSIS — I10 Essential (primary) hypertension: Secondary | ICD-10-CM | POA: Diagnosis not present

## 2020-05-09 DIAGNOSIS — D638 Anemia in other chronic diseases classified elsewhere: Secondary | ICD-10-CM | POA: Diagnosis not present

## 2020-05-09 DIAGNOSIS — D0512 Intraductal carcinoma in situ of left breast: Secondary | ICD-10-CM | POA: Diagnosis not present

## 2020-05-09 DIAGNOSIS — E782 Mixed hyperlipidemia: Secondary | ICD-10-CM | POA: Diagnosis not present

## 2020-05-09 DIAGNOSIS — M069 Rheumatoid arthritis, unspecified: Secondary | ICD-10-CM | POA: Diagnosis not present

## 2020-05-09 DIAGNOSIS — Z1329 Encounter for screening for other suspected endocrine disorder: Secondary | ICD-10-CM | POA: Diagnosis not present

## 2020-05-09 DIAGNOSIS — E7800 Pure hypercholesterolemia, unspecified: Secondary | ICD-10-CM | POA: Diagnosis not present

## 2020-05-09 DIAGNOSIS — Z1159 Encounter for screening for other viral diseases: Secondary | ICD-10-CM | POA: Diagnosis not present

## 2020-05-09 DIAGNOSIS — Z510 Encounter for antineoplastic radiation therapy: Secondary | ICD-10-CM | POA: Diagnosis not present

## 2020-05-12 DIAGNOSIS — Z510 Encounter for antineoplastic radiation therapy: Secondary | ICD-10-CM | POA: Diagnosis not present

## 2020-05-12 DIAGNOSIS — D0512 Intraductal carcinoma in situ of left breast: Secondary | ICD-10-CM | POA: Diagnosis not present

## 2020-05-13 DIAGNOSIS — Z510 Encounter for antineoplastic radiation therapy: Secondary | ICD-10-CM | POA: Diagnosis not present

## 2020-05-13 DIAGNOSIS — D0512 Intraductal carcinoma in situ of left breast: Secondary | ICD-10-CM | POA: Diagnosis not present

## 2020-05-14 DIAGNOSIS — I10 Essential (primary) hypertension: Secondary | ICD-10-CM | POA: Diagnosis not present

## 2020-05-14 DIAGNOSIS — D0512 Intraductal carcinoma in situ of left breast: Secondary | ICD-10-CM | POA: Diagnosis not present

## 2020-05-14 DIAGNOSIS — Z23 Encounter for immunization: Secondary | ICD-10-CM | POA: Diagnosis not present

## 2020-05-14 DIAGNOSIS — E782 Mixed hyperlipidemia: Secondary | ICD-10-CM | POA: Diagnosis not present

## 2020-05-14 DIAGNOSIS — N281 Cyst of kidney, acquired: Secondary | ICD-10-CM | POA: Diagnosis not present

## 2020-05-14 DIAGNOSIS — I700 Atherosclerosis of aorta: Secondary | ICD-10-CM | POA: Diagnosis not present

## 2020-05-14 DIAGNOSIS — M47812 Spondylosis without myelopathy or radiculopathy, cervical region: Secondary | ICD-10-CM | POA: Diagnosis not present

## 2020-05-14 DIAGNOSIS — M069 Rheumatoid arthritis, unspecified: Secondary | ICD-10-CM | POA: Diagnosis not present

## 2020-05-15 DIAGNOSIS — D0512 Intraductal carcinoma in situ of left breast: Secondary | ICD-10-CM | POA: Diagnosis not present

## 2020-05-15 DIAGNOSIS — Z510 Encounter for antineoplastic radiation therapy: Secondary | ICD-10-CM | POA: Diagnosis not present

## 2020-05-16 DIAGNOSIS — D0512 Intraductal carcinoma in situ of left breast: Secondary | ICD-10-CM | POA: Diagnosis not present

## 2020-05-16 DIAGNOSIS — Z510 Encounter for antineoplastic radiation therapy: Secondary | ICD-10-CM | POA: Diagnosis not present

## 2020-05-21 DIAGNOSIS — D0512 Intraductal carcinoma in situ of left breast: Secondary | ICD-10-CM | POA: Diagnosis not present

## 2020-05-21 DIAGNOSIS — Z510 Encounter for antineoplastic radiation therapy: Secondary | ICD-10-CM | POA: Diagnosis not present

## 2020-05-22 DIAGNOSIS — D0512 Intraductal carcinoma in situ of left breast: Secondary | ICD-10-CM | POA: Diagnosis not present

## 2020-05-22 DIAGNOSIS — Z510 Encounter for antineoplastic radiation therapy: Secondary | ICD-10-CM | POA: Diagnosis not present

## 2020-05-23 DIAGNOSIS — D0512 Intraductal carcinoma in situ of left breast: Secondary | ICD-10-CM | POA: Diagnosis not present

## 2020-05-23 DIAGNOSIS — Z510 Encounter for antineoplastic radiation therapy: Secondary | ICD-10-CM | POA: Diagnosis not present

## 2020-05-26 DIAGNOSIS — D0512 Intraductal carcinoma in situ of left breast: Secondary | ICD-10-CM | POA: Diagnosis not present

## 2020-05-26 DIAGNOSIS — Z510 Encounter for antineoplastic radiation therapy: Secondary | ICD-10-CM | POA: Diagnosis not present

## 2020-05-27 DIAGNOSIS — D0512 Intraductal carcinoma in situ of left breast: Secondary | ICD-10-CM | POA: Diagnosis not present

## 2020-05-27 DIAGNOSIS — Z510 Encounter for antineoplastic radiation therapy: Secondary | ICD-10-CM | POA: Diagnosis not present

## 2020-05-31 DIAGNOSIS — I10 Essential (primary) hypertension: Secondary | ICD-10-CM | POA: Diagnosis not present

## 2020-05-31 DIAGNOSIS — E7849 Other hyperlipidemia: Secondary | ICD-10-CM | POA: Diagnosis not present

## 2020-05-31 DIAGNOSIS — N281 Cyst of kidney, acquired: Secondary | ICD-10-CM | POA: Diagnosis not present

## 2020-06-02 DIAGNOSIS — Z9889 Other specified postprocedural states: Secondary | ICD-10-CM | POA: Diagnosis not present

## 2020-06-02 DIAGNOSIS — Z923 Personal history of irradiation: Secondary | ICD-10-CM | POA: Diagnosis not present

## 2020-06-02 DIAGNOSIS — Z5181 Encounter for therapeutic drug level monitoring: Secondary | ICD-10-CM | POA: Diagnosis not present

## 2020-06-02 DIAGNOSIS — Z79811 Long term (current) use of aromatase inhibitors: Secondary | ICD-10-CM | POA: Diagnosis not present

## 2020-06-02 DIAGNOSIS — D0512 Intraductal carcinoma in situ of left breast: Secondary | ICD-10-CM | POA: Diagnosis not present

## 2020-06-05 DIAGNOSIS — M5030 Other cervical disc degeneration, unspecified cervical region: Secondary | ICD-10-CM | POA: Diagnosis not present

## 2020-06-05 DIAGNOSIS — M150 Primary generalized (osteo)arthritis: Secondary | ICD-10-CM | POA: Diagnosis not present

## 2020-06-05 DIAGNOSIS — Z79899 Other long term (current) drug therapy: Secondary | ICD-10-CM | POA: Diagnosis not present

## 2020-06-05 DIAGNOSIS — M2550 Pain in unspecified joint: Secondary | ICD-10-CM | POA: Diagnosis not present

## 2020-06-05 DIAGNOSIS — Z6824 Body mass index (BMI) 24.0-24.9, adult: Secondary | ICD-10-CM | POA: Diagnosis not present

## 2020-06-05 DIAGNOSIS — M0609 Rheumatoid arthritis without rheumatoid factor, multiple sites: Secondary | ICD-10-CM | POA: Diagnosis not present

## 2020-06-12 DIAGNOSIS — Z923 Personal history of irradiation: Secondary | ICD-10-CM | POA: Insufficient documentation

## 2020-06-12 DIAGNOSIS — Z5181 Encounter for therapeutic drug level monitoring: Secondary | ICD-10-CM | POA: Insufficient documentation

## 2020-06-24 DIAGNOSIS — D0512 Intraductal carcinoma in situ of left breast: Secondary | ICD-10-CM | POA: Diagnosis not present

## 2020-06-24 DIAGNOSIS — Z510 Encounter for antineoplastic radiation therapy: Secondary | ICD-10-CM | POA: Diagnosis not present

## 2020-06-30 DIAGNOSIS — N281 Cyst of kidney, acquired: Secondary | ICD-10-CM | POA: Diagnosis not present

## 2020-06-30 DIAGNOSIS — E7849 Other hyperlipidemia: Secondary | ICD-10-CM | POA: Diagnosis not present

## 2020-06-30 DIAGNOSIS — I10 Essential (primary) hypertension: Secondary | ICD-10-CM | POA: Diagnosis not present

## 2020-07-14 DIAGNOSIS — Z9889 Other specified postprocedural states: Secondary | ICD-10-CM | POA: Diagnosis not present

## 2020-07-14 DIAGNOSIS — D0512 Intraductal carcinoma in situ of left breast: Secondary | ICD-10-CM | POA: Diagnosis not present

## 2020-07-14 DIAGNOSIS — Z5320 Procedure and treatment not carried out because of patient's decision for unspecified reasons: Secondary | ICD-10-CM | POA: Diagnosis not present

## 2020-07-14 DIAGNOSIS — Z79811 Long term (current) use of aromatase inhibitors: Secondary | ICD-10-CM | POA: Diagnosis not present

## 2020-07-14 DIAGNOSIS — M8589 Other specified disorders of bone density and structure, multiple sites: Secondary | ICD-10-CM | POA: Diagnosis not present

## 2020-07-14 DIAGNOSIS — Z5181 Encounter for therapeutic drug level monitoring: Secondary | ICD-10-CM | POA: Diagnosis not present

## 2020-07-14 DIAGNOSIS — Z923 Personal history of irradiation: Secondary | ICD-10-CM | POA: Diagnosis not present

## 2020-07-28 DIAGNOSIS — Z5320 Procedure and treatment not carried out because of patient's decision for unspecified reasons: Secondary | ICD-10-CM | POA: Insufficient documentation

## 2020-07-30 DIAGNOSIS — E7849 Other hyperlipidemia: Secondary | ICD-10-CM | POA: Diagnosis not present

## 2020-07-30 DIAGNOSIS — I10 Essential (primary) hypertension: Secondary | ICD-10-CM | POA: Diagnosis not present

## 2020-07-30 DIAGNOSIS — N281 Cyst of kidney, acquired: Secondary | ICD-10-CM | POA: Diagnosis not present

## 2020-09-15 DIAGNOSIS — J449 Chronic obstructive pulmonary disease, unspecified: Secondary | ICD-10-CM | POA: Diagnosis not present

## 2020-09-15 DIAGNOSIS — E7849 Other hyperlipidemia: Secondary | ICD-10-CM | POA: Diagnosis not present

## 2020-09-15 DIAGNOSIS — M47812 Spondylosis without myelopathy or radiculopathy, cervical region: Secondary | ICD-10-CM | POA: Diagnosis not present

## 2020-09-15 DIAGNOSIS — Z9189 Other specified personal risk factors, not elsewhere classified: Secondary | ICD-10-CM | POA: Diagnosis not present

## 2020-09-15 DIAGNOSIS — N281 Cyst of kidney, acquired: Secondary | ICD-10-CM | POA: Diagnosis not present

## 2020-09-15 DIAGNOSIS — Z0001 Encounter for general adult medical examination with abnormal findings: Secondary | ICD-10-CM | POA: Diagnosis not present

## 2020-09-15 DIAGNOSIS — I10 Essential (primary) hypertension: Secondary | ICD-10-CM | POA: Diagnosis not present

## 2020-09-15 DIAGNOSIS — M069 Rheumatoid arthritis, unspecified: Secondary | ICD-10-CM | POA: Diagnosis not present

## 2020-09-15 DIAGNOSIS — Z23 Encounter for immunization: Secondary | ICD-10-CM | POA: Diagnosis not present

## 2020-09-15 DIAGNOSIS — D0512 Intraductal carcinoma in situ of left breast: Secondary | ICD-10-CM | POA: Diagnosis not present

## 2020-09-15 DIAGNOSIS — D649 Anemia, unspecified: Secondary | ICD-10-CM | POA: Diagnosis not present

## 2020-09-23 DIAGNOSIS — Z923 Personal history of irradiation: Secondary | ICD-10-CM | POA: Diagnosis not present

## 2020-09-23 DIAGNOSIS — D0512 Intraductal carcinoma in situ of left breast: Secondary | ICD-10-CM | POA: Diagnosis not present

## 2020-09-23 DIAGNOSIS — Z79811 Long term (current) use of aromatase inhibitors: Secondary | ICD-10-CM | POA: Diagnosis not present

## 2020-09-29 DIAGNOSIS — N281 Cyst of kidney, acquired: Secondary | ICD-10-CM | POA: Diagnosis not present

## 2020-09-29 DIAGNOSIS — E7849 Other hyperlipidemia: Secondary | ICD-10-CM | POA: Diagnosis not present

## 2020-09-29 DIAGNOSIS — I10 Essential (primary) hypertension: Secondary | ICD-10-CM | POA: Diagnosis not present

## 2020-10-06 DIAGNOSIS — I10 Essential (primary) hypertension: Secondary | ICD-10-CM | POA: Diagnosis not present

## 2020-10-30 DIAGNOSIS — I10 Essential (primary) hypertension: Secondary | ICD-10-CM | POA: Diagnosis not present

## 2020-10-30 DIAGNOSIS — E7849 Other hyperlipidemia: Secondary | ICD-10-CM | POA: Diagnosis not present

## 2020-10-30 DIAGNOSIS — N281 Cyst of kidney, acquired: Secondary | ICD-10-CM | POA: Diagnosis not present

## 2020-11-30 DIAGNOSIS — E7849 Other hyperlipidemia: Secondary | ICD-10-CM | POA: Diagnosis not present

## 2020-11-30 DIAGNOSIS — N281 Cyst of kidney, acquired: Secondary | ICD-10-CM | POA: Diagnosis not present

## 2020-11-30 DIAGNOSIS — I10 Essential (primary) hypertension: Secondary | ICD-10-CM | POA: Diagnosis not present

## 2020-12-04 DIAGNOSIS — Z79899 Other long term (current) drug therapy: Secondary | ICD-10-CM | POA: Diagnosis not present

## 2020-12-04 DIAGNOSIS — M0609 Rheumatoid arthritis without rheumatoid factor, multiple sites: Secondary | ICD-10-CM | POA: Diagnosis not present

## 2020-12-04 DIAGNOSIS — M2550 Pain in unspecified joint: Secondary | ICD-10-CM | POA: Diagnosis not present

## 2020-12-04 DIAGNOSIS — M5030 Other cervical disc degeneration, unspecified cervical region: Secondary | ICD-10-CM | POA: Diagnosis not present

## 2020-12-04 DIAGNOSIS — M150 Primary generalized (osteo)arthritis: Secondary | ICD-10-CM | POA: Diagnosis not present

## 2020-12-04 DIAGNOSIS — R5383 Other fatigue: Secondary | ICD-10-CM | POA: Diagnosis not present

## 2020-12-04 DIAGNOSIS — Z6821 Body mass index (BMI) 21.0-21.9, adult: Secondary | ICD-10-CM | POA: Diagnosis not present

## 2020-12-10 DIAGNOSIS — Z853 Personal history of malignant neoplasm of breast: Secondary | ICD-10-CM | POA: Diagnosis not present

## 2020-12-10 DIAGNOSIS — R922 Inconclusive mammogram: Secondary | ICD-10-CM | POA: Diagnosis not present

## 2021-01-13 DIAGNOSIS — D649 Anemia, unspecified: Secondary | ICD-10-CM | POA: Diagnosis not present

## 2021-01-13 DIAGNOSIS — I10 Essential (primary) hypertension: Secondary | ICD-10-CM | POA: Diagnosis not present

## 2021-01-13 DIAGNOSIS — E782 Mixed hyperlipidemia: Secondary | ICD-10-CM | POA: Diagnosis not present

## 2021-01-13 DIAGNOSIS — E7800 Pure hypercholesterolemia, unspecified: Secondary | ICD-10-CM | POA: Diagnosis not present

## 2021-01-13 DIAGNOSIS — D519 Vitamin B12 deficiency anemia, unspecified: Secondary | ICD-10-CM | POA: Diagnosis not present

## 2021-01-13 DIAGNOSIS — J449 Chronic obstructive pulmonary disease, unspecified: Secondary | ICD-10-CM | POA: Diagnosis not present

## 2021-01-13 DIAGNOSIS — D529 Folate deficiency anemia, unspecified: Secondary | ICD-10-CM | POA: Diagnosis not present

## 2021-01-16 DIAGNOSIS — Z23 Encounter for immunization: Secondary | ICD-10-CM | POA: Diagnosis not present

## 2021-01-16 DIAGNOSIS — E7849 Other hyperlipidemia: Secondary | ICD-10-CM | POA: Diagnosis not present

## 2021-01-16 DIAGNOSIS — I700 Atherosclerosis of aorta: Secondary | ICD-10-CM | POA: Diagnosis not present

## 2021-01-16 DIAGNOSIS — N281 Cyst of kidney, acquired: Secondary | ICD-10-CM | POA: Diagnosis not present

## 2021-01-16 DIAGNOSIS — M069 Rheumatoid arthritis, unspecified: Secondary | ICD-10-CM | POA: Diagnosis not present

## 2021-01-16 DIAGNOSIS — D0512 Intraductal carcinoma in situ of left breast: Secondary | ICD-10-CM | POA: Diagnosis not present

## 2021-01-16 DIAGNOSIS — J449 Chronic obstructive pulmonary disease, unspecified: Secondary | ICD-10-CM | POA: Diagnosis not present

## 2021-01-16 DIAGNOSIS — I10 Essential (primary) hypertension: Secondary | ICD-10-CM | POA: Diagnosis not present

## 2021-02-27 DIAGNOSIS — Z122 Encounter for screening for malignant neoplasm of respiratory organs: Secondary | ICD-10-CM | POA: Diagnosis not present

## 2021-02-27 DIAGNOSIS — Z87891 Personal history of nicotine dependence: Secondary | ICD-10-CM | POA: Diagnosis not present

## 2021-04-01 DIAGNOSIS — I10 Essential (primary) hypertension: Secondary | ICD-10-CM | POA: Diagnosis not present

## 2021-04-01 DIAGNOSIS — E7849 Other hyperlipidemia: Secondary | ICD-10-CM | POA: Diagnosis not present

## 2021-04-01 DIAGNOSIS — N281 Cyst of kidney, acquired: Secondary | ICD-10-CM | POA: Diagnosis not present

## 2021-05-13 DIAGNOSIS — E7849 Other hyperlipidemia: Secondary | ICD-10-CM | POA: Diagnosis not present

## 2021-05-13 DIAGNOSIS — D638 Anemia in other chronic diseases classified elsewhere: Secondary | ICD-10-CM | POA: Diagnosis not present

## 2021-05-13 DIAGNOSIS — I10 Essential (primary) hypertension: Secondary | ICD-10-CM | POA: Diagnosis not present

## 2021-05-13 DIAGNOSIS — Z1329 Encounter for screening for other suspected endocrine disorder: Secondary | ICD-10-CM | POA: Diagnosis not present

## 2021-05-13 DIAGNOSIS — E782 Mixed hyperlipidemia: Secondary | ICD-10-CM | POA: Diagnosis not present

## 2021-05-13 DIAGNOSIS — J449 Chronic obstructive pulmonary disease, unspecified: Secondary | ICD-10-CM | POA: Diagnosis not present

## 2021-05-18 DIAGNOSIS — M069 Rheumatoid arthritis, unspecified: Secondary | ICD-10-CM | POA: Diagnosis not present

## 2021-05-18 DIAGNOSIS — Z7189 Other specified counseling: Secondary | ICD-10-CM | POA: Diagnosis not present

## 2021-05-18 DIAGNOSIS — N281 Cyst of kidney, acquired: Secondary | ICD-10-CM | POA: Diagnosis not present

## 2021-05-18 DIAGNOSIS — M47812 Spondylosis without myelopathy or radiculopathy, cervical region: Secondary | ICD-10-CM | POA: Diagnosis not present

## 2021-05-18 DIAGNOSIS — D0512 Intraductal carcinoma in situ of left breast: Secondary | ICD-10-CM | POA: Diagnosis not present

## 2021-05-18 DIAGNOSIS — D649 Anemia, unspecified: Secondary | ICD-10-CM | POA: Diagnosis not present

## 2021-05-18 DIAGNOSIS — I10 Essential (primary) hypertension: Secondary | ICD-10-CM | POA: Diagnosis not present

## 2021-05-18 DIAGNOSIS — Z9189 Other specified personal risk factors, not elsewhere classified: Secondary | ICD-10-CM | POA: Diagnosis not present

## 2021-05-18 DIAGNOSIS — J449 Chronic obstructive pulmonary disease, unspecified: Secondary | ICD-10-CM | POA: Diagnosis not present

## 2021-05-18 DIAGNOSIS — I700 Atherosclerosis of aorta: Secondary | ICD-10-CM | POA: Diagnosis not present

## 2021-05-27 DIAGNOSIS — Z23 Encounter for immunization: Secondary | ICD-10-CM | POA: Diagnosis not present

## 2021-06-30 DIAGNOSIS — E7849 Other hyperlipidemia: Secondary | ICD-10-CM | POA: Diagnosis not present

## 2021-06-30 DIAGNOSIS — I10 Essential (primary) hypertension: Secondary | ICD-10-CM | POA: Diagnosis not present

## 2021-08-04 DIAGNOSIS — Z681 Body mass index (BMI) 19 or less, adult: Secondary | ICD-10-CM | POA: Diagnosis not present

## 2021-08-04 DIAGNOSIS — M1991 Primary osteoarthritis, unspecified site: Secondary | ICD-10-CM | POA: Diagnosis not present

## 2021-08-04 DIAGNOSIS — Z79899 Other long term (current) drug therapy: Secondary | ICD-10-CM | POA: Diagnosis not present

## 2021-08-04 DIAGNOSIS — R634 Abnormal weight loss: Secondary | ICD-10-CM | POA: Diagnosis not present

## 2021-08-04 DIAGNOSIS — R5383 Other fatigue: Secondary | ICD-10-CM | POA: Diagnosis not present

## 2021-08-04 DIAGNOSIS — M0609 Rheumatoid arthritis without rheumatoid factor, multiple sites: Secondary | ICD-10-CM | POA: Diagnosis not present

## 2021-08-30 DIAGNOSIS — I10 Essential (primary) hypertension: Secondary | ICD-10-CM | POA: Diagnosis not present

## 2021-08-30 DIAGNOSIS — E7849 Other hyperlipidemia: Secondary | ICD-10-CM | POA: Diagnosis not present

## 2021-08-30 DIAGNOSIS — N281 Cyst of kidney, acquired: Secondary | ICD-10-CM | POA: Diagnosis not present

## 2021-09-30 DIAGNOSIS — I10 Essential (primary) hypertension: Secondary | ICD-10-CM | POA: Diagnosis not present

## 2021-09-30 DIAGNOSIS — E7849 Other hyperlipidemia: Secondary | ICD-10-CM | POA: Diagnosis not present

## 2021-10-30 DIAGNOSIS — E782 Mixed hyperlipidemia: Secondary | ICD-10-CM | POA: Diagnosis not present

## 2021-10-30 DIAGNOSIS — D0512 Intraductal carcinoma in situ of left breast: Secondary | ICD-10-CM | POA: Diagnosis not present

## 2021-10-30 DIAGNOSIS — M069 Rheumatoid arthritis, unspecified: Secondary | ICD-10-CM | POA: Diagnosis not present

## 2021-10-30 DIAGNOSIS — I10 Essential (primary) hypertension: Secondary | ICD-10-CM | POA: Diagnosis not present

## 2021-12-31 DIAGNOSIS — E782 Mixed hyperlipidemia: Secondary | ICD-10-CM | POA: Diagnosis not present

## 2021-12-31 DIAGNOSIS — J449 Chronic obstructive pulmonary disease, unspecified: Secondary | ICD-10-CM | POA: Diagnosis not present

## 2022-01-12 DIAGNOSIS — Z1329 Encounter for screening for other suspected endocrine disorder: Secondary | ICD-10-CM | POA: Diagnosis not present

## 2022-01-12 DIAGNOSIS — Z131 Encounter for screening for diabetes mellitus: Secondary | ICD-10-CM | POA: Diagnosis not present

## 2022-01-12 DIAGNOSIS — D529 Folate deficiency anemia, unspecified: Secondary | ICD-10-CM | POA: Diagnosis not present

## 2022-01-12 DIAGNOSIS — E7849 Other hyperlipidemia: Secondary | ICD-10-CM | POA: Diagnosis not present

## 2022-01-12 DIAGNOSIS — I10 Essential (primary) hypertension: Secondary | ICD-10-CM | POA: Diagnosis not present

## 2022-01-18 DIAGNOSIS — I10 Essential (primary) hypertension: Secondary | ICD-10-CM | POA: Diagnosis not present

## 2022-01-18 DIAGNOSIS — D649 Anemia, unspecified: Secondary | ICD-10-CM | POA: Diagnosis not present

## 2022-01-18 DIAGNOSIS — I700 Atherosclerosis of aorta: Secondary | ICD-10-CM | POA: Diagnosis not present

## 2022-01-18 DIAGNOSIS — M069 Rheumatoid arthritis, unspecified: Secondary | ICD-10-CM | POA: Diagnosis not present

## 2022-01-18 DIAGNOSIS — Z23 Encounter for immunization: Secondary | ICD-10-CM | POA: Diagnosis not present

## 2022-01-18 DIAGNOSIS — D0512 Intraductal carcinoma in situ of left breast: Secondary | ICD-10-CM | POA: Diagnosis not present

## 2022-01-18 DIAGNOSIS — E7849 Other hyperlipidemia: Secondary | ICD-10-CM | POA: Diagnosis not present

## 2022-01-18 DIAGNOSIS — Z681 Body mass index (BMI) 19 or less, adult: Secondary | ICD-10-CM | POA: Diagnosis not present

## 2022-01-18 DIAGNOSIS — M47812 Spondylosis without myelopathy or radiculopathy, cervical region: Secondary | ICD-10-CM | POA: Diagnosis not present

## 2022-01-18 DIAGNOSIS — J449 Chronic obstructive pulmonary disease, unspecified: Secondary | ICD-10-CM | POA: Diagnosis not present

## 2022-01-18 DIAGNOSIS — N281 Cyst of kidney, acquired: Secondary | ICD-10-CM | POA: Diagnosis not present

## 2022-02-01 DIAGNOSIS — N289 Disorder of kidney and ureter, unspecified: Secondary | ICD-10-CM | POA: Diagnosis not present

## 2022-02-10 DIAGNOSIS — Z780 Asymptomatic menopausal state: Secondary | ICD-10-CM | POA: Diagnosis not present

## 2022-02-10 DIAGNOSIS — Z853 Personal history of malignant neoplasm of breast: Secondary | ICD-10-CM | POA: Diagnosis not present

## 2022-02-11 DIAGNOSIS — I10 Essential (primary) hypertension: Secondary | ICD-10-CM | POA: Diagnosis not present

## 2022-02-22 DIAGNOSIS — N19 Unspecified kidney failure: Secondary | ICD-10-CM | POA: Diagnosis not present

## 2022-02-22 DIAGNOSIS — N281 Cyst of kidney, acquired: Secondary | ICD-10-CM | POA: Diagnosis not present

## 2022-02-25 DIAGNOSIS — I10 Essential (primary) hypertension: Secondary | ICD-10-CM | POA: Diagnosis not present

## 2022-03-19 DIAGNOSIS — I10 Essential (primary) hypertension: Secondary | ICD-10-CM | POA: Diagnosis not present

## 2022-03-19 DIAGNOSIS — R899 Unspecified abnormal finding in specimens from other organs, systems and tissues: Secondary | ICD-10-CM | POA: Diagnosis not present

## 2022-03-30 DIAGNOSIS — I10 Essential (primary) hypertension: Secondary | ICD-10-CM | POA: Diagnosis not present

## 2022-03-30 DIAGNOSIS — N19 Unspecified kidney failure: Secondary | ICD-10-CM | POA: Diagnosis not present

## 2022-04-01 DIAGNOSIS — J449 Chronic obstructive pulmonary disease, unspecified: Secondary | ICD-10-CM | POA: Diagnosis not present

## 2022-04-01 DIAGNOSIS — E782 Mixed hyperlipidemia: Secondary | ICD-10-CM | POA: Diagnosis not present

## 2022-04-17 DIAGNOSIS — N270 Small kidney, unilateral: Secondary | ICD-10-CM | POA: Diagnosis not present

## 2022-04-17 DIAGNOSIS — D638 Anemia in other chronic diseases classified elsewhere: Secondary | ICD-10-CM | POA: Diagnosis not present

## 2022-04-17 DIAGNOSIS — I160 Hypertensive urgency: Secondary | ICD-10-CM | POA: Diagnosis not present

## 2022-04-17 DIAGNOSIS — N170 Acute kidney failure with tubular necrosis: Secondary | ICD-10-CM | POA: Diagnosis not present

## 2022-04-17 DIAGNOSIS — E876 Hypokalemia: Secondary | ICD-10-CM | POA: Diagnosis not present

## 2022-04-17 DIAGNOSIS — N281 Cyst of kidney, acquired: Secondary | ICD-10-CM | POA: Diagnosis not present

## 2022-04-17 DIAGNOSIS — R809 Proteinuria, unspecified: Secondary | ICD-10-CM | POA: Diagnosis not present

## 2022-05-02 DIAGNOSIS — E782 Mixed hyperlipidemia: Secondary | ICD-10-CM | POA: Diagnosis not present

## 2022-05-02 DIAGNOSIS — J449 Chronic obstructive pulmonary disease, unspecified: Secondary | ICD-10-CM | POA: Diagnosis not present

## 2022-05-03 DIAGNOSIS — K279 Peptic ulcer, site unspecified, unspecified as acute or chronic, without hemorrhage or perforation: Secondary | ICD-10-CM

## 2022-05-03 HISTORY — DX: Peptic ulcer, site unspecified, unspecified as acute or chronic, without hemorrhage or perforation: K27.9

## 2022-05-07 DIAGNOSIS — R609 Edema, unspecified: Secondary | ICD-10-CM | POA: Diagnosis not present

## 2022-05-07 DIAGNOSIS — R52 Pain, unspecified: Secondary | ICD-10-CM | POA: Diagnosis not present

## 2022-05-07 DIAGNOSIS — K2100 Gastro-esophageal reflux disease with esophagitis, without bleeding: Secondary | ICD-10-CM | POA: Diagnosis not present

## 2022-05-07 DIAGNOSIS — N179 Acute kidney failure, unspecified: Secondary | ICD-10-CM | POA: Insufficient documentation

## 2022-05-07 DIAGNOSIS — E782 Mixed hyperlipidemia: Secondary | ICD-10-CM | POA: Insufficient documentation

## 2022-05-07 DIAGNOSIS — J189 Pneumonia, unspecified organism: Secondary | ICD-10-CM

## 2022-05-07 DIAGNOSIS — J4489 Other specified chronic obstructive pulmonary disease: Secondary | ICD-10-CM | POA: Diagnosis not present

## 2022-05-07 DIAGNOSIS — J440 Chronic obstructive pulmonary disease with acute lower respiratory infection: Secondary | ICD-10-CM | POA: Diagnosis not present

## 2022-05-07 DIAGNOSIS — N184 Chronic kidney disease, stage 4 (severe): Secondary | ICD-10-CM | POA: Diagnosis not present

## 2022-05-07 DIAGNOSIS — J441 Chronic obstructive pulmonary disease with (acute) exacerbation: Secondary | ICD-10-CM | POA: Diagnosis present

## 2022-05-07 DIAGNOSIS — M069 Rheumatoid arthritis, unspecified: Secondary | ICD-10-CM | POA: Diagnosis not present

## 2022-05-07 DIAGNOSIS — J90 Pleural effusion, not elsewhere classified: Secondary | ICD-10-CM | POA: Diagnosis not present

## 2022-05-07 DIAGNOSIS — R069 Unspecified abnormalities of breathing: Secondary | ICD-10-CM | POA: Diagnosis not present

## 2022-05-07 DIAGNOSIS — R059 Cough, unspecified: Secondary | ICD-10-CM | POA: Diagnosis not present

## 2022-05-07 DIAGNOSIS — M06041 Rheumatoid arthritis without rheumatoid factor, right hand: Secondary | ICD-10-CM | POA: Diagnosis not present

## 2022-05-07 DIAGNOSIS — Z87891 Personal history of nicotine dependence: Secondary | ICD-10-CM | POA: Diagnosis not present

## 2022-05-07 DIAGNOSIS — Z79811 Long term (current) use of aromatase inhibitors: Secondary | ICD-10-CM | POA: Diagnosis not present

## 2022-05-07 DIAGNOSIS — I10 Essential (primary) hypertension: Secondary | ICD-10-CM | POA: Diagnosis not present

## 2022-05-07 DIAGNOSIS — I129 Hypertensive chronic kidney disease with stage 1 through stage 4 chronic kidney disease, or unspecified chronic kidney disease: Secondary | ICD-10-CM | POA: Diagnosis not present

## 2022-05-07 DIAGNOSIS — Z20822 Contact with and (suspected) exposure to covid-19: Secondary | ICD-10-CM | POA: Diagnosis not present

## 2022-05-07 DIAGNOSIS — R0600 Dyspnea, unspecified: Secondary | ICD-10-CM | POA: Diagnosis not present

## 2022-05-07 DIAGNOSIS — F331 Major depressive disorder, recurrent, moderate: Secondary | ICD-10-CM | POA: Insufficient documentation

## 2022-05-07 DIAGNOSIS — D631 Anemia in chronic kidney disease: Secondary | ICD-10-CM | POA: Diagnosis not present

## 2022-05-14 DIAGNOSIS — E559 Vitamin D deficiency, unspecified: Secondary | ICD-10-CM | POA: Insufficient documentation

## 2022-05-14 DIAGNOSIS — N2889 Other specified disorders of kidney and ureter: Secondary | ICD-10-CM | POA: Insufficient documentation

## 2022-05-14 DIAGNOSIS — N281 Cyst of kidney, acquired: Secondary | ICD-10-CM | POA: Insufficient documentation

## 2022-05-16 ENCOUNTER — Other Ambulatory Visit (HOSPITAL_COMMUNITY): Payer: Self-pay | Admitting: *Deleted

## 2022-05-16 ENCOUNTER — Inpatient Hospital Stay (HOSPITAL_COMMUNITY)
Admission: EM | Admit: 2022-05-16 | Discharge: 2022-06-05 | DRG: 981 | Disposition: A | Discharge status: Skilled Nursing Facility | Payer: Medicare Other | Attending: Internal Medicine | Admitting: Internal Medicine

## 2022-05-16 ENCOUNTER — Encounter (HOSPITAL_COMMUNITY): Payer: Self-pay | Admitting: Internal Medicine

## 2022-05-16 ENCOUNTER — Other Ambulatory Visit: Payer: Self-pay

## 2022-05-16 ENCOUNTER — Emergency Department (HOSPITAL_COMMUNITY): Payer: Medicare Other

## 2022-05-16 ENCOUNTER — Other Ambulatory Visit (HOSPITAL_COMMUNITY): Payer: Medicare Other

## 2022-05-16 DIAGNOSIS — N179 Acute kidney failure, unspecified: Secondary | ICD-10-CM | POA: Diagnosis present

## 2022-05-16 DIAGNOSIS — J90 Pleural effusion, not elsewhere classified: Secondary | ICD-10-CM | POA: Diagnosis not present

## 2022-05-16 DIAGNOSIS — E876 Hypokalemia: Secondary | ICD-10-CM | POA: Diagnosis not present

## 2022-05-16 DIAGNOSIS — N189 Chronic kidney disease, unspecified: Secondary | ICD-10-CM | POA: Diagnosis not present

## 2022-05-16 DIAGNOSIS — N390 Urinary tract infection, site not specified: Secondary | ICD-10-CM | POA: Diagnosis not present

## 2022-05-16 DIAGNOSIS — K631 Perforation of intestine (nontraumatic): Secondary | ICD-10-CM | POA: Diagnosis not present

## 2022-05-16 DIAGNOSIS — K668 Other specified disorders of peritoneum: Secondary | ICD-10-CM | POA: Insufficient documentation

## 2022-05-16 DIAGNOSIS — R0602 Shortness of breath: Secondary | ICD-10-CM | POA: Diagnosis not present

## 2022-05-16 DIAGNOSIS — N186 End stage renal disease: Secondary | ICD-10-CM | POA: Diagnosis present

## 2022-05-16 DIAGNOSIS — R195 Other fecal abnormalities: Secondary | ICD-10-CM | POA: Diagnosis present

## 2022-05-16 DIAGNOSIS — E871 Hypo-osmolality and hyponatremia: Secondary | ICD-10-CM | POA: Diagnosis not present

## 2022-05-16 DIAGNOSIS — K2970 Gastritis, unspecified, without bleeding: Secondary | ICD-10-CM | POA: Diagnosis not present

## 2022-05-16 DIAGNOSIS — I745 Embolism and thrombosis of iliac artery: Secondary | ICD-10-CM | POA: Diagnosis not present

## 2022-05-16 DIAGNOSIS — K265 Chronic or unspecified duodenal ulcer with perforation: Secondary | ICD-10-CM | POA: Diagnosis not present

## 2022-05-16 DIAGNOSIS — K259 Gastric ulcer, unspecified as acute or chronic, without hemorrhage or perforation: Secondary | ICD-10-CM | POA: Diagnosis not present

## 2022-05-16 DIAGNOSIS — N1832 Chronic kidney disease, stage 3b: Secondary | ICD-10-CM | POA: Diagnosis present

## 2022-05-16 DIAGNOSIS — Z87891 Personal history of nicotine dependence: Secondary | ICD-10-CM

## 2022-05-16 DIAGNOSIS — I959 Hypotension, unspecified: Secondary | ICD-10-CM | POA: Diagnosis not present

## 2022-05-16 DIAGNOSIS — E8729 Other acidosis: Secondary | ICD-10-CM | POA: Diagnosis not present

## 2022-05-16 DIAGNOSIS — K921 Melena: Secondary | ICD-10-CM | POA: Diagnosis not present

## 2022-05-16 DIAGNOSIS — D509 Iron deficiency anemia, unspecified: Secondary | ICD-10-CM | POA: Diagnosis present

## 2022-05-16 DIAGNOSIS — J440 Chronic obstructive pulmonary disease with acute lower respiratory infection: Secondary | ICD-10-CM | POA: Diagnosis present

## 2022-05-16 DIAGNOSIS — Y95 Nosocomial condition: Secondary | ICD-10-CM | POA: Diagnosis present

## 2022-05-16 DIAGNOSIS — T380X5A Adverse effect of glucocorticoids and synthetic analogues, initial encounter: Secondary | ICD-10-CM | POA: Diagnosis not present

## 2022-05-16 DIAGNOSIS — M069 Rheumatoid arthritis, unspecified: Secondary | ICD-10-CM | POA: Diagnosis not present

## 2022-05-16 DIAGNOSIS — A419 Sepsis, unspecified organism: Secondary | ICD-10-CM | POA: Insufficient documentation

## 2022-05-16 DIAGNOSIS — R188 Other ascites: Secondary | ICD-10-CM | POA: Diagnosis not present

## 2022-05-16 DIAGNOSIS — K6389 Other specified diseases of intestine: Secondary | ICD-10-CM | POA: Diagnosis not present

## 2022-05-16 DIAGNOSIS — K659 Peritonitis, unspecified: Secondary | ICD-10-CM | POA: Insufficient documentation

## 2022-05-16 DIAGNOSIS — R2689 Other abnormalities of gait and mobility: Secondary | ICD-10-CM | POA: Diagnosis not present

## 2022-05-16 DIAGNOSIS — E875 Hyperkalemia: Secondary | ICD-10-CM | POA: Diagnosis not present

## 2022-05-16 DIAGNOSIS — Z1611 Resistance to penicillins: Secondary | ICD-10-CM | POA: Diagnosis not present

## 2022-05-16 DIAGNOSIS — K266 Chronic or unspecified duodenal ulcer with both hemorrhage and perforation: Secondary | ICD-10-CM | POA: Diagnosis not present

## 2022-05-16 DIAGNOSIS — D631 Anemia in chronic kidney disease: Secondary | ICD-10-CM | POA: Diagnosis not present

## 2022-05-16 DIAGNOSIS — J9811 Atelectasis: Secondary | ICD-10-CM | POA: Diagnosis not present

## 2022-05-16 DIAGNOSIS — R6889 Other general symptoms and signs: Secondary | ICD-10-CM | POA: Diagnosis not present

## 2022-05-16 DIAGNOSIS — R7989 Other specified abnormal findings of blood chemistry: Secondary | ICD-10-CM | POA: Diagnosis present

## 2022-05-16 DIAGNOSIS — I701 Atherosclerosis of renal artery: Secondary | ICD-10-CM | POA: Diagnosis not present

## 2022-05-16 DIAGNOSIS — R109 Unspecified abdominal pain: Secondary | ICD-10-CM | POA: Diagnosis not present

## 2022-05-16 DIAGNOSIS — D62 Acute posthemorrhagic anemia: Secondary | ICD-10-CM

## 2022-05-16 DIAGNOSIS — K209 Esophagitis, unspecified without bleeding: Secondary | ICD-10-CM | POA: Diagnosis present

## 2022-05-16 DIAGNOSIS — I10 Essential (primary) hypertension: Secondary | ICD-10-CM | POA: Diagnosis not present

## 2022-05-16 DIAGNOSIS — I953 Hypotension of hemodialysis: Secondary | ICD-10-CM | POA: Diagnosis not present

## 2022-05-16 DIAGNOSIS — Z992 Dependence on renal dialysis: Secondary | ICD-10-CM

## 2022-05-16 DIAGNOSIS — R680 Hypothermia, not associated with low environmental temperature: Secondary | ICD-10-CM | POA: Diagnosis present

## 2022-05-16 DIAGNOSIS — R0689 Other abnormalities of breathing: Secondary | ICD-10-CM | POA: Diagnosis not present

## 2022-05-16 DIAGNOSIS — Z4901 Encounter for fitting and adjustment of extracorporeal dialysis catheter: Secondary | ICD-10-CM | POA: Diagnosis not present

## 2022-05-16 DIAGNOSIS — Z79620 Long term (current) use of immunosuppressive biologic: Secondary | ICD-10-CM

## 2022-05-16 DIAGNOSIS — E8809 Other disorders of plasma-protein metabolism, not elsewhere classified: Secondary | ICD-10-CM | POA: Diagnosis present

## 2022-05-16 DIAGNOSIS — D649 Anemia, unspecified: Secondary | ICD-10-CM | POA: Diagnosis not present

## 2022-05-16 DIAGNOSIS — I120 Hypertensive chronic kidney disease with stage 5 chronic kidney disease or end stage renal disease: Secondary | ICD-10-CM | POA: Diagnosis not present

## 2022-05-16 DIAGNOSIS — E46 Unspecified protein-calorie malnutrition: Secondary | ICD-10-CM | POA: Diagnosis not present

## 2022-05-16 DIAGNOSIS — R9431 Abnormal electrocardiogram [ECG] [EKG]: Secondary | ICD-10-CM | POA: Diagnosis present

## 2022-05-16 DIAGNOSIS — K922 Gastrointestinal hemorrhage, unspecified: Secondary | ICD-10-CM

## 2022-05-16 DIAGNOSIS — D500 Iron deficiency anemia secondary to blood loss (chronic): Secondary | ICD-10-CM | POA: Diagnosis not present

## 2022-05-16 DIAGNOSIS — J441 Chronic obstructive pulmonary disease with (acute) exacerbation: Secondary | ICD-10-CM | POA: Diagnosis present

## 2022-05-16 DIAGNOSIS — I272 Pulmonary hypertension, unspecified: Secondary | ICD-10-CM | POA: Diagnosis not present

## 2022-05-16 DIAGNOSIS — R197 Diarrhea, unspecified: Secondary | ICD-10-CM | POA: Diagnosis present

## 2022-05-16 DIAGNOSIS — J449 Chronic obstructive pulmonary disease, unspecified: Secondary | ICD-10-CM | POA: Diagnosis not present

## 2022-05-16 DIAGNOSIS — R918 Other nonspecific abnormal finding of lung field: Secondary | ICD-10-CM | POA: Diagnosis not present

## 2022-05-16 DIAGNOSIS — R062 Wheezing: Secondary | ICD-10-CM | POA: Diagnosis not present

## 2022-05-16 DIAGNOSIS — Z1152 Encounter for screening for COVID-19: Secondary | ICD-10-CM

## 2022-05-16 DIAGNOSIS — T68XXXA Hypothermia, initial encounter: Secondary | ICD-10-CM | POA: Diagnosis not present

## 2022-05-16 DIAGNOSIS — K279 Peptic ulcer, site unspecified, unspecified as acute or chronic, without hemorrhage or perforation: Secondary | ICD-10-CM | POA: Diagnosis not present

## 2022-05-16 DIAGNOSIS — K269 Duodenal ulcer, unspecified as acute or chronic, without hemorrhage or perforation: Secondary | ICD-10-CM | POA: Insufficient documentation

## 2022-05-16 DIAGNOSIS — Z743 Need for continuous supervision: Secondary | ICD-10-CM | POA: Diagnosis not present

## 2022-05-16 DIAGNOSIS — D696 Thrombocytopenia, unspecified: Secondary | ICD-10-CM | POA: Diagnosis not present

## 2022-05-16 DIAGNOSIS — Z9889 Other specified postprocedural states: Secondary | ICD-10-CM | POA: Diagnosis not present

## 2022-05-16 DIAGNOSIS — Z7401 Bed confinement status: Secondary | ICD-10-CM | POA: Diagnosis not present

## 2022-05-16 DIAGNOSIS — D72823 Leukemoid reaction: Secondary | ICD-10-CM | POA: Diagnosis not present

## 2022-05-16 DIAGNOSIS — Z1621 Resistance to vancomycin: Secondary | ICD-10-CM | POA: Diagnosis not present

## 2022-05-16 DIAGNOSIS — M6281 Muscle weakness (generalized): Secondary | ICD-10-CM | POA: Diagnosis not present

## 2022-05-16 DIAGNOSIS — Z79899 Other long term (current) drug therapy: Secondary | ICD-10-CM

## 2022-05-16 DIAGNOSIS — K264 Chronic or unspecified duodenal ulcer with hemorrhage: Secondary | ICD-10-CM | POA: Diagnosis not present

## 2022-05-16 DIAGNOSIS — M898X9 Other specified disorders of bone, unspecified site: Secondary | ICD-10-CM | POA: Diagnosis present

## 2022-05-16 DIAGNOSIS — A4181 Sepsis due to Enterococcus: Secondary | ICD-10-CM | POA: Diagnosis not present

## 2022-05-16 DIAGNOSIS — E8779 Other fluid overload: Secondary | ICD-10-CM | POA: Diagnosis not present

## 2022-05-16 DIAGNOSIS — K625 Hemorrhage of anus and rectum: Secondary | ICD-10-CM | POA: Diagnosis not present

## 2022-05-16 DIAGNOSIS — N185 Chronic kidney disease, stage 5: Secondary | ICD-10-CM | POA: Diagnosis not present

## 2022-05-16 DIAGNOSIS — Z48815 Encounter for surgical aftercare following surgery on the digestive system: Secondary | ICD-10-CM | POA: Diagnosis not present

## 2022-05-16 DIAGNOSIS — N200 Calculus of kidney: Secondary | ICD-10-CM | POA: Diagnosis not present

## 2022-05-16 DIAGNOSIS — J189 Pneumonia, unspecified organism: Principal | ICD-10-CM | POA: Diagnosis present

## 2022-05-16 DIAGNOSIS — J9690 Respiratory failure, unspecified, unspecified whether with hypoxia or hypercapnia: Secondary | ICD-10-CM | POA: Diagnosis not present

## 2022-05-16 DIAGNOSIS — T68XXXD Hypothermia, subsequent encounter: Secondary | ICD-10-CM | POA: Diagnosis not present

## 2022-05-16 DIAGNOSIS — I5A Non-ischemic myocardial injury (non-traumatic): Secondary | ICD-10-CM | POA: Diagnosis present

## 2022-05-16 DIAGNOSIS — E872 Acidosis, unspecified: Secondary | ICD-10-CM | POA: Diagnosis present

## 2022-05-16 DIAGNOSIS — R079 Chest pain, unspecified: Secondary | ICD-10-CM | POA: Diagnosis not present

## 2022-05-16 DIAGNOSIS — K8020 Calculus of gallbladder without cholecystitis without obstruction: Secondary | ICD-10-CM | POA: Diagnosis not present

## 2022-05-16 DIAGNOSIS — N281 Cyst of kidney, acquired: Secondary | ICD-10-CM | POA: Diagnosis not present

## 2022-05-16 HISTORY — DX: Chronic kidney disease, stage 4 (severe): N18.4

## 2022-05-16 HISTORY — DX: Rheumatoid arthritis, unspecified: M06.9

## 2022-05-16 HISTORY — DX: Chronic obstructive pulmonary disease, unspecified: J44.9

## 2022-05-16 LAB — BLOOD GAS, VENOUS
Acid-base deficit: 15.1 mmol/L — ABNORMAL HIGH (ref 0.0–2.0)
Bicarbonate: 9.9 mmol/L — ABNORMAL LOW (ref 20.0–28.0)
Drawn by: 59595
FIO2: 21 %
O2 Saturation: 92.4 %
Patient temperature: 35.3
pCO2, Ven: 20 mmHg — ABNORMAL LOW (ref 44–60)
pH, Ven: 7.28 (ref 7.25–7.43)
pO2, Ven: 58 mmHg — ABNORMAL HIGH (ref 32–45)

## 2022-05-16 LAB — CBC WITH DIFFERENTIAL/PLATELET
Abs Immature Granulocytes: 0.62 10*3/uL — ABNORMAL HIGH (ref 0.00–0.07)
Basophils Absolute: 0.1 10*3/uL (ref 0.0–0.1)
Basophils Relative: 0 %
Eosinophils Absolute: 0 10*3/uL (ref 0.0–0.5)
Eosinophils Relative: 0 %
HCT: 30.6 % — ABNORMAL LOW (ref 36.0–46.0)
Hemoglobin: 10.1 g/dL — ABNORMAL LOW (ref 12.0–15.0)
Immature Granulocytes: 4 %
Lymphocytes Relative: 9 %
Lymphs Abs: 1.4 10*3/uL (ref 0.7–4.0)
MCH: 26.1 pg (ref 26.0–34.0)
MCHC: 33 g/dL (ref 30.0–36.0)
MCV: 79.1 fL — ABNORMAL LOW (ref 80.0–100.0)
Monocytes Absolute: 0.6 10*3/uL (ref 0.1–1.0)
Monocytes Relative: 4 %
Neutro Abs: 12.6 10*3/uL — ABNORMAL HIGH (ref 1.7–7.7)
Neutrophils Relative %: 83 %
Platelets: 254 10*3/uL (ref 150–400)
RBC: 3.87 MIL/uL (ref 3.87–5.11)
RDW: 18.3 % — ABNORMAL HIGH (ref 11.5–15.5)
WBC: 15.3 10*3/uL — ABNORMAL HIGH (ref 4.0–10.5)
nRBC: 0 % (ref 0.0–0.2)

## 2022-05-16 LAB — COMPREHENSIVE METABOLIC PANEL
ALT: 28 U/L (ref 0–44)
AST: 23 U/L (ref 15–41)
Albumin: 2.8 g/dL — ABNORMAL LOW (ref 3.5–5.0)
Alkaline Phosphatase: 163 U/L — ABNORMAL HIGH (ref 38–126)
Anion gap: 16 — ABNORMAL HIGH (ref 5–15)
BUN: 75 mg/dL — ABNORMAL HIGH (ref 8–23)
CO2: 11 mmol/L — ABNORMAL LOW (ref 22–32)
Calcium: 7.4 mg/dL — ABNORMAL LOW (ref 8.9–10.3)
Chloride: 114 mmol/L — ABNORMAL HIGH (ref 98–111)
Creatinine, Ser: 7.5 mg/dL — ABNORMAL HIGH (ref 0.44–1.00)
GFR, Estimated: 5 mL/min — ABNORMAL LOW (ref >60)
Glucose, Bld: 86 mg/dL (ref 70–99)
Potassium: 3.8 mmol/L (ref 3.5–5.1)
Sodium: 141 mmol/L (ref 135–145)
Total Bilirubin: 0.9 mg/dL (ref 0.3–1.2)
Total Protein: 6.4 g/dL — ABNORMAL LOW (ref 6.5–8.1)

## 2022-05-16 LAB — CBG MONITORING, ED: Glucose-Capillary: 91 mg/dL (ref 70–99)

## 2022-05-16 LAB — PROCALCITONIN: Procalcitonin: 0.43 ng/mL

## 2022-05-16 LAB — RESP PANEL BY RT-PCR (RSV, FLU A&B, COVID)  RVPGX2
Influenza A by PCR: NEGATIVE
Influenza B by PCR: NEGATIVE
Resp Syncytial Virus by PCR: NEGATIVE
SARS Coronavirus 2 by RT PCR: NEGATIVE

## 2022-05-16 LAB — IRON AND TIBC
Iron: 73 ug/dL (ref 28–170)
Saturation Ratios: 40 % — ABNORMAL HIGH (ref 10.4–31.8)
TIBC: 181 ug/dL — ABNORMAL LOW (ref 250–450)
UIBC: 108 ug/dL

## 2022-05-16 LAB — C DIFFICILE QUICK SCREEN W PCR REFLEX
C Diff antigen: NEGATIVE
C Diff interpretation: NOT DETECTED
C Diff toxin: NEGATIVE

## 2022-05-16 LAB — MRSA NEXT GEN BY PCR, NASAL: MRSA by PCR Next Gen: NOT DETECTED

## 2022-05-16 LAB — BRAIN NATRIURETIC PEPTIDE: B Natriuretic Peptide: 760 pg/mL — ABNORMAL HIGH (ref 0.0–100.0)

## 2022-05-16 LAB — FERRITIN: Ferritin: 170 ng/mL (ref 11–307)

## 2022-05-16 LAB — LACTIC ACID, PLASMA: Lactic Acid, Venous: 0.5 mmol/L (ref 0.5–1.9)

## 2022-05-16 LAB — PHOSPHORUS: Phosphorus: 11.9 mg/dL — ABNORMAL HIGH (ref 2.5–4.6)

## 2022-05-16 LAB — TROPONIN I (HIGH SENSITIVITY)
Troponin I (High Sensitivity): 33 ng/L — ABNORMAL HIGH (ref <18)
Troponin I (High Sensitivity): 29 ng/L — ABNORMAL HIGH (ref <18)

## 2022-05-16 LAB — MAGNESIUM: Magnesium: 2.6 mg/dL — ABNORMAL HIGH (ref 1.7–2.4)

## 2022-05-16 MED ORDER — ACETAMINOPHEN 325 MG PO TABS: 650.0000 mg | ORAL_TABLET | Freq: Four times a day (QID) | ORAL | Status: DC | PRN

## 2022-05-16 MED ORDER — SODIUM CHLORIDE 0.9 % IV SOLN
1.0000 g | INTRAVENOUS | Status: DC
  Administered 2022-05-17 – 2022-05-20 (×4): 1 g via INTRAVENOUS
  Filled 2022-05-16 (×5): qty 10

## 2022-05-16 MED ORDER — DM-GUAIFENESIN ER 30-600 MG PO TB12
1.0000 | ORAL_TABLET | Freq: Two times a day (BID) | ORAL | Status: DC
  Administered 2022-05-16 – 2022-05-27 (×22): 1 via ORAL
  Filled 2022-05-16 (×26): qty 1

## 2022-05-16 MED ORDER — OXYCODONE HCL 5 MG PO TABS
5.0000 mg | ORAL_TABLET | Freq: Three times a day (TID) | ORAL | Status: DC | PRN
  Administered 2022-05-16 – 2022-05-26 (×4): 5 mg via ORAL
  Filled 2022-05-16 (×4): qty 1

## 2022-05-16 MED ORDER — SODIUM CHLORIDE 0.9 % IV SOLN
2.0000 g | Freq: Once | INTRAVENOUS | Status: AC
  Administered 2022-05-16: 2 g via INTRAVENOUS
  Filled 2022-05-16: qty 12.5

## 2022-05-16 MED ORDER — PROCHLORPERAZINE EDISYLATE 10 MG/2ML IJ SOLN
10.0000 mg | Freq: Four times a day (QID) | INTRAMUSCULAR | Status: DC | PRN
  Administered 2022-05-16 – 2022-05-25 (×7): 10 mg via INTRAVENOUS
  Filled 2022-05-16 (×7): qty 2

## 2022-05-16 MED ORDER — VANCOMYCIN VARIABLE DOSE PER UNSTABLE RENAL FUNCTION (PHARMACIST DOSING): Status: DC

## 2022-05-16 MED ORDER — VANCOMYCIN HCL 1250 MG/250ML IV SOLN
1250.0000 mg | Freq: Once | INTRAVENOUS | Status: AC
  Administered 2022-05-16: 1250 mg via INTRAVENOUS
  Filled 2022-05-16: qty 250

## 2022-05-16 MED ORDER — AMLODIPINE BESYLATE 5 MG PO TABS
10.0000 mg | ORAL_TABLET | Freq: Every day | ORAL | Status: DC
  Administered 2022-05-16 – 2022-05-27 (×10): 10 mg via ORAL
  Filled 2022-05-16 (×12): qty 2

## 2022-05-16 MED ORDER — IPRATROPIUM-ALBUTEROL 0.5-2.5 (3) MG/3ML IN SOLN: 3.0000 mL | RESPIRATORY_TRACT | Status: DC | PRN

## 2022-05-16 MED ORDER — HEPARIN SODIUM (PORCINE) 5000 UNIT/ML IJ SOLN
5000.0000 [IU] | Freq: Three times a day (TID) | INTRAMUSCULAR | Status: DC
  Administered 2022-05-16 – 2022-05-18 (×7): 5000 [IU] via SUBCUTANEOUS
  Filled 2022-05-16 (×7): qty 1

## 2022-05-16 MED ORDER — ACETAMINOPHEN 650 MG RE SUPP: 650.0000 mg | Freq: Four times a day (QID) | RECTAL | Status: DC | PRN

## 2022-05-16 MED ORDER — HYDRALAZINE HCL 20 MG/ML IJ SOLN
20.0000 mg | Freq: Four times a day (QID) | INTRAMUSCULAR | Status: DC | PRN
  Administered 2022-05-16 – 2022-05-18 (×3): 20 mg via INTRAVENOUS
  Filled 2022-05-16 (×3): qty 1

## 2022-05-16 MED ORDER — ENSURE ENLIVE PO LIQD
237.0000 mL | Freq: Two times a day (BID) | ORAL | Status: DC
  Administered 2022-05-16 – 2022-05-23 (×4): 237 mL via ORAL
  Filled 2022-05-16 (×6): qty 237

## 2022-05-16 NOTE — H&P (Signed)
History and Physical    Patient: Sheri Cooper WNU:[id number] DOB: 10-06-1950 DOA: 05/16/2022 DOS: the patient was seen and examined on 05/16/2022 PCP: [facility]  Patient coming from: Home  Chief Complaint:  Chief Complaint  Patient presents with   Shortness of Breath   HPI: Sheri Cooper is a 72 y.o. female with medical history significant of COPD, rheumatoid arthritis, stage IV CKD who presents to the emergency department due to ongoing and worsening shortness of breath which has been ongoing since being discharged from [facility] on 1/10.  Patient was admitted and discharged from [facility] from 1/5 through 05/12/2022 due to community-acquired pneumonia and COPD exacerbation as well as acute kidney injury.  She received antibiotics (ceftriaxone and azithromycin) for pneumonia and she did receive nebulizer treatment and was transferred to [facility] on oxygen prior to being discharged.  Patient was supposed to follow-up with a neurologist [facility]) after the discharge, but she has not been able to do these due to ongoing shortness of breath.  Patient states that she has been at her brother's house since discharge and that the house was very cold.  She states that she has been retaining fluids since she left the hospital and that shortness of breath has worsened.  EMS was activated and patient was taken to the ED for further evaluation and management.  ED Course:  In the emergency department, temperature was 95.5 F, BP was 158/57, but other vital signs were within normal range.  Workup in the ED showed WBC 15.3 with a left shift, hemoglobin 10.0, hematocrit 30.6, MCV 79.1, platelets 254.  BMP showed sodium 141, potassium 3.8, chloride 114, bicarb 11, glucose 86, BUN 75, creatinine 7.50, albumin 2.8, ALP 163, EGFR 5, anion gap 16, BNP 761, troponin x 1-33, lactic acid was normal.  Influenza A, B, SARS coronavirus 2, RSV was negative. Chest x-ray showed persistent  right-sided effusion with slight increase in basilar infiltrate on the right.  CT chest CT chest without contrast was suggestive of multilobar bilateral bronchopneumonia with bilateral parapneumonic pleural effusions Patient was treated with IV cefepime, vancomycin.  Hospitalist was asked to admit patient for further evaluation and management.  Review of Systems: Review of systems as noted in the HPI. All other systems reviewed and are negative.   Social History:  reports that she has quit smoking. Her smoking use included cigarettes. She does not have any smokeless tobacco history on file. She reports that she does not use drugs. No history on file for alcohol use.   Family history: No pertinent family history    Prior to Admission medications   Not on File    Physical Exam: BP (!) 163/49   Pulse 65   Temp (!) 95.5 F (35.3 C) (Rectal)   Resp 15   Ht '4\' 11"'$  (1.499 m)   Wt 57.2 kg   SpO2 93%   BMI 25.45 kg/m   General: 72 y.o. year-old female ill appearing, but in no acute distress.  Alert and oriented x3. HEENT: NCAT, EOMI Neck: Supple, trachea medial Cardiovascular: Regular rate and rhythm with no rubs or gallops.  No thyromegaly or JVD noted. +2 lower extremity edema. 2/4 pulses in all 4 extremities. Respiratory: Decreased breath sounds bilaterally on auscultation Abdomen: Soft, nontender nondistended with normal bowel sounds x4 quadrants. Muskuloskeletal: No cyanosis or clubbing  noted bilaterally Neuro: CN II-XII intact, sensation, reflexes intact Skin: No ulcerative lesions noted or rashes Psychiatry: Judgement and insight appear normal. Mood is  appropriate for condition and setting          Labs on Admission:  Basic Metabolic Panel: Recent Labs  Lab 05/16/22 0309  NA 141  K 3.8  CL 114*  CO2 11*  GLUCOSE 86  BUN 75*  CREATININE 7.50*  CALCIUM 7.4*   Liver Function Tests: Recent Labs  Lab 05/16/22 0309  AST 23  ALT 28  ALKPHOS 163*  BILITOT 0.9   PROT 6.4*  ALBUMIN 2.8*   No results for input(s): "LIPASE", "AMYLASE" in the last 168 hours. No results for input(s): "AMMONIA" in the last 168 hours. CBC: Recent Labs  Lab 05/16/22 0309  WBC 15.3*  NEUTROABS 12.6*  HGB 10.1*  HCT 30.6*  MCV 79.1*  PLT 254   Cardiac Enzymes: No results for input(s): "CKTOTAL", "CKMB", "CKMBINDEX", "TROPONINI" in the last 168 hours.  BNP (last 3 results) Recent Labs    05/16/22 0309  BNP 760.0*    ProBNP (last 3 results) No results for input(s): "PROBNP" in the last 8760 hours.  CBG: Recent Labs  Lab 05/16/22 0322  GLUCAP 91    Radiological Exams on Admission: CT Chest Wo Contrast  Result Date: 05/16/2022 CLINICAL DATA:  72 year old female with history of suspected pneumonia. EXAM: CT CHEST WITHOUT CONTRAST TECHNIQUE: Multidetector CT imaging of the chest was performed following the standard protocol without IV contrast. RADIATION DOSE REDUCTION: This exam was performed according to the departmental dose-optimization program which includes automated exposure control, adjustment of the mA and/or kV according to patient size and/or use of iterative reconstruction technique. COMPARISON:  Chest x-ray 05/16/2022. FINDINGS: Cardiovascular: Heart size is normal. There is no significant pericardial fluid, thickening or pericardial calcification. Aortic atherosclerosis. No definite coronary artery calcifications. Mild calcifications of the aortic valve. Mediastinum/Nodes: No pathologically enlarged mediastinal or hilar lymph nodes. Please note that accurate exclusion of hilar adenopathy is limited on noncontrast CT scans. Esophagus is unremarkable in appearance. No axillary lymphadenopathy. Lungs/Pleura: Moderate to large right and small to moderate left pleural effusions lying dependently associated with areas of passive subsegmental atelectasis in the lower lobes of the lungs bilaterally. In addition, there are mid to upper lung predominant patchy  areas of ground-glass attenuation and nodular appearing consolidative airspace disease, likely to reflect multilobar bilateral bronchopneumonia. The largest of these nodular appearing regions in the apex of the left upper lobe (axial image 32 of series 4) measuring 1.3 x 1.2 cm. Upper Abdomen: Aortic atherosclerosis. Tiny partially calcified gallstones lying dependently in the fundus of the gallbladder. Small volume of ascites. Multiple renal lesions bilaterally, incompletely characterized on today's noncontrast CT examination, most notable for an exophytic intermediate attenuation lesion (25 HU) measuring 1.9 cm in the anterior aspect of the interpolar region of the left kidney. A cluster of nonobstructive calculi is also noted in the posterior aspect of the upper pole collecting system of the right kidney measuring up to 3 mm. Musculoskeletal: There are no aggressive appearing lytic or blastic lesions noted in the visualized portions of the skeleton. IMPRESSION: 1. The appearance the chest is most suggestive of multilobar bilateral bronchopneumonia with bilateral parapneumonic pleural effusions, as above. 2. Given the nodular appearance of some of the areas of apparent airspace consolidation, follow-up noncontrast chest CT is recommended in 3 months to ensure resolution of the nodular areas following treatment for the patient's acute illness, to exclude underlying neoplasm. 3. Multiple renal lesions bilaterally, incompletely characterized on today's noncontrast CT examination, including an indeterminate 1.9 cm lesion in the anterior aspect  of the interpolar region of the left kidney. Follow-up nonemergent outpatient abdominal MRI with and without IV gadolinium is recommended in the near future to definitively evaluate this lesion and exclude neoplasm. 4. Cholelithiasis. 5. Nonobstructive calculi in the right renal collecting system measuring up to 3 mm. 6. Ascites. 7. Aortic atherosclerosis. Aortic Atherosclerosis  (ICD10-I70.0). Electronically Signed   By: Vinnie Langton M.D.   On: 05/16/2022 05:23   DG Chest Port 1 View  Result Date: 05/16/2022 CLINICAL DATA:  Shortness of breath EXAM: PORTABLE CHEST 1 VIEW COMPARISON:  05/07/2022 FINDINGS: Cardiac shadow is enlarged but stable. Small pleural effusion on the right is again noted. Some increased basilar density is noted on the right when compared with the prior study. Postsurgical changes in the left breast are seen. No bony abnormality is noted. IMPRESSION: Persistent right-sided effusion with slight increase in basilar infiltrate on the right. Electronically Signed   By: Inez Catalina M.D.   On: 05/16/2022 03:45    EKG: I independently viewed the EKG done and my findings are as followed: Normal sinus rhythm with rate of 70 bpm with prolonged QTc of 580 ms  Assessment/Plan Present on Admission:  HCAP (healthcare-associated pneumonia)  Principal Problem:   HCAP (healthcare-associated pneumonia) Active Problems:   Hypothermia   Acute kidney injury superimposed on chronic kidney disease (HCC)   Elevated brain natriuretic peptide (BNP) level   Elevated troponin   Increased anion gap metabolic acidosis   Prolonged QT interval   Hypoalbuminemia due to protein-calorie malnutrition (HCC)   Iron deficiency anemia   COPD (chronic obstructive pulmonary disease) ([place])  HCAP CT chest without contrast was suggestive of multilobar bilateral bronchopneumonia with bilateral parapneumonic pleural effusions Patient was recently admitted and treated for pneumonia at [facility] PORT/PSI of 132 points  indicating 27.0-29.2% mortality Patient was started on cefepime and vancomycin, we shall continue same at this time with plan to de-escalate/discontinue based on blood culture, sputum culture, urine Legionella, strep pneumo and procalcitonin Continue Tylenol as needed Continue Mucinex, incentive spirometry, flutter valve   Hypothermia Temperature 95.9F, patient states t  she was staying with her brother since being discharged from the hospital and that the house was cold Continue Bair hugger Due to acute illness of patient, TSH may not be accurate at this time, however this could be done 3 to 4 weeks after patient has recovered from current illness.  Acute kidney injury on CKD 5 BUN 75, creatinine 7.50.  This was 52/6.69 when discharged from [facility] on 1/10 Nephrology will be consulted and we shall await further recommendations  Anion gap metabolic acidosis Anion gap 16, this may be due to patient's current kidney status Nephrology consult was already placed and we shall await further recommendations  Elevated BNP, rule out CHF BNP 760, kidney status may be a contributing factor to the elevated BNP, but it would still be reasonable to rule out CHF Continue total input/output, daily weights and fluid restriction Lasix will be temporarily held pending nephrology recommendation  Continue Cardiac diet  Echocardiogram in the morning   Elevated troponin possibly secondary to type II demand ischemia Troponin x 1 - 33, denies any chest pain, continue to trend troponin  Incidental finding of multiple renal lesions CT angiography of chest showed multiple renal lesions bilaterally, incompletely characterized on today's noncontrast CT examination, including an indeterminate 1.9 cm lesion in the anterior aspect of the interpolar region of the left kidney.  Follow-up nonemergent outpatient abdominal MRI with and without IV gadolinium is  recommended in the near future to definitively evaluate this lesion and exclude neoplasm  Prolonged QT interval QTc 567m Avoid QT prolonging drugs Magnesium level will be checked Repeat EKG in the morning  Iron deficiency anemia Iron studies will be done  Hypoalbuminemia possibly secondary to moderate protein calorie malnutrition Albumin 2.8, protein supplement to be provided  COPD (not in acute exacerbation) Continue DuoNebs  as needed   DVT prophylaxis: Heparin subcu  Code Status: Full code  Family Communication: None at bedside  Consults: Nephrology  Severity of Illness: The appropriate patient status for this patient is INPATIENT. Inpatient status is judged to be reasonable and necessary in order to provide the required intensity of service to ensure the patient's safety. The patient's presenting symptoms, physical exam findings, and initial radiographic and laboratory data in the context of their chronic comorbidities is felt to place them at high risk for further clinical deterioration. Furthermore, it is not anticipated that the patient will be medically stable for discharge from the hospital within 2 midnights of admission.   * I certify that at the point of admission it is my clinical judgment that the patient will require inpatient hospital care spanning beyond 2 midnights from the point of admission due to high intensity of service, high risk for further deterioration and high frequency of surveillance required.*  Author: OBernadette Hoit DO 05/16/2022 7:03 AM  For on call review [email or web address]

## 2022-05-16 NOTE — ED Notes (Signed)
Bair hugger lowered to low setting from high.

## 2022-05-16 NOTE — Progress Notes (Signed)
Pharmacy Antibiotic Note  Sheri Cooper is a 72 y.o. female admitted on 05/16/2022 with SOB/HCAP and AKI.  Pharmacy has been consulted for Vancomycin and Cefepime dosing.  Vancomycin 1250 mg IV given in ED at 0545    Plan: F/U renal function and redose Vancomycin as indicated.  Will need to check level in 3-4 days if SCr does not significantly improve Cefepime 1 g IV q24h  Height: '4\' 11"'$  (149.9 cm) Weight: 57.2 kg (126 lb) IBW/kg (Calculated) : 43.2  Temp (24hrs), Avg:95.5 F (35.3 C), Min:95.5 F (35.3 C), Max:95.5 F (35.3 C)  Recent Labs  Lab 05/16/22 0309 05/16/22 0333  WBC 15.3*  --   CREATININE 7.50*  --   LATICACIDVEN  --  0.5    Estimated Creatinine Clearance: 5.2 mL/min (A) (by C-G formula based on SCr of 7.5 mg/dL (H)).    No Known Allergies    Sheri Cooper 05/16/2022 6:40 AM

## 2022-05-16 NOTE — Progress Notes (Addendum)
Patient seen and evaluated, chart reviewed, please see EMR for updated orders. Please see full H&P dictated by admitting physician Dr. Josephine Cables for same date of service.    Brief Summary:-  72 y.o. female with medical history significant of COPD, rheumatoid arthritis, stage V CKD  Admitted on 05/16/2022 with healthcare associated pneumonia and worsening renal function with anion gap metabolic acidosis -Patient was recently discharged from [facility] on 05/12/2022 after treatment for COPD  A/p 1)HCAP--- recent hospitalization at [facility] as outlined above -COVID, flu and RSV negative -Leukocytosis and elevated procalcitonin noted -c/n Vanc /cefepime combo -.  Bronchodilators and mucolytics as ordered  2) hypothermia--patient believes initiation Varimate she will stay with her brother and did not have much heat in the house -Weaned off [employer]  3)-CKD 5 -Review of records in [place] shows that her current renal function is close to recent baseline -Anion gap metabolic acidosis noted -Nephrology consult requested - patient sees Dr. Theador Hawthorne as outpatient -Probably needs to have an evaluation to prepare for hemodialysis in the near future  renally adjust medications, avoid nephrotoxic agents / dehydration  / hypotension  4) COPD--continue bronchodilators -Hold off on steroids  5) chronic anemia----multifactorial suspect partly related to underlying CKD 5 -Labs pending -No evidence of ongoing blood loss  6)Incidental finding of multiple renal lesions CT angiography of chest showed multiple renal lesions bilaterally, incompletely characterized on today's noncontrast CT examination, including an indeterminate 1.9 cm lesion in the anterior aspect of the interpolar region of the left kidney.  Follow-up nonemergent outpatient abdominal MRI with and without IV gadolinium is recommended in the near future to definitively evaluate this lesion and exclude neoplasm  7)Elevated  BNP--echo from 05/10/2022 at [facility] available in [place] reveals-  The left ventricle is normal in size with normal wall thickness. The left ventricular systolic function is normal, LVEF is visually estimated at 60-65%.  There is normal left ventricular diastolic function.  -There is mild to moderate pulmonary hypertension no significant valvular abnormalities  8)Loose Stools -x 3 days.... Recent hospitalizations at [facility] and recent exposure to antibiotics -Check stool for C. difficile and GI pathogen - Total care time 83 minutes  Roxan Hockey, MD

## 2022-05-16 NOTE — TOC Progression Note (Signed)
Transition of Care [facility]) - Progression Note    Patient Details  Name: THAILY HACKWORTH MRN: [id number] Date of Birth: Feb 25, 1951  Transition of Care [facility]) CM/SW Contact  Salome Arnt, [place] Phone Number: 05/16/2022, 11:25 AM  Clinical Narrative:   Transition of Care (TOC) Screening Note   Patient Details  Name: GEORGIANNA BAND Date of Birth: Apr 14, 1951   Transition of Care [facility]) CM/SW Contact:    Salome Arnt, [place] Phone Number: 05/16/2022, 11:25 AM    Transition of Care Department [facility]) has reviewed patient and no TOC needs have been identified at this time. We will continue to monitor patient advancement through interdisciplinary progression rounds. If new patient transition needs arise, please place a TOC consult.            Expected Discharge Plan and Services                                               Social Determinants of Health (SDOH) Interventions SDOH Screenings   Tobacco Use: Medium Risk (05/16/2022)    Readmission Risk Interventions     No data to display

## 2022-05-16 NOTE — ED Provider Notes (Signed)
[facility] EMERGENCY DEPARTMENT Provider Note   CSN: [id number] Arrival date & time: 05/16/22  0300     History  Chief Complaint  Patient presents with   Shortness of Breath    Sheri Cooper is a 72 y.o. female.  Patient presents to the emergency department for evaluation of shortness of breath.  Patient reports that this has been ongoing for some weeks.  She reports that she was hospitalized at [facility] last week and treated for pneumonia and renal failure.  She has been retaining fluid.  She has a cough.       Home Medications Prior to Admission medications   Not on File      Allergies    Patient has no allergy information on record.    Review of Systems   Review of Systems  Physical Exam Updated Vital Signs BP (!) 158/57   Pulse 62   Temp (!) 95.5 F (35.3 C) (Rectal)   Resp 18   Ht '4\' 11"'$  (1.499 m)   Wt 57.2 kg   SpO2 94%   BMI 25.45 kg/m  Physical Exam Vitals and nursing note reviewed.  Constitutional:      General: She is not in acute distress.    Appearance: She is well-developed.  HENT:     Head: Normocephalic and atraumatic.     Mouth/Throat:     Mouth: Mucous membranes are moist.  Eyes:     General: Vision grossly intact. Gaze aligned appropriately.     Extraocular Movements: Extraocular movements intact.     Conjunctiva/sclera: Conjunctivae normal.  Cardiovascular:     Rate and Rhythm: Normal rate and regular rhythm.     Pulses: Normal pulses.     Heart sounds: Normal heart sounds, S1 normal and S2 normal. No murmur heard.    No friction rub. No gallop.  Pulmonary:     Effort: Tachypnea and accessory muscle usage present.     Breath sounds: Decreased breath sounds present.  Abdominal:     General: Bowel sounds are normal.     Palpations: Abdomen is soft.     Tenderness: There is no abdominal tenderness. There is no guarding or rebound.     Hernia: No hernia is present.  Musculoskeletal:        General: No swelling.      Cervical back: Full passive range of motion without pain, normal range of motion and neck supple. No spinous process tenderness or muscular tenderness. Normal range of motion.     Right lower leg: Edema present.     Left lower leg: Edema present.  Skin:    General: Skin is warm and dry.     Capillary Refill: Capillary refill takes less than 2 seconds.     Findings: No ecchymosis, erythema, rash or wound.  Neurological:     General: No focal deficit present.     Mental Status: She is alert and oriented to person, place, and time.     GCS: GCS eye subscore is 4. GCS verbal subscore is 5. GCS motor subscore is 6.     Cranial Nerves: Cranial nerves 2-12 are intact.     Sensory: Sensation is intact.     Motor: Motor function is intact.     Coordination: Coordination is intact.  Psychiatric:        Attention and Perception: Attention normal.        Mood and Affect: Mood normal.        Speech: Speech  normal.        Behavior: Behavior normal.     ED Results / Procedures / Treatments   Labs (all labs ordered are listed, but only abnormal results are displayed) Labs Reviewed  CBC WITH DIFFERENTIAL/PLATELET - Abnormal; Notable for the following components:      Result Value   WBC 15.3 (*)    Hemoglobin 10.1 (*)    HCT 30.6 (*)    MCV 79.1 (*)    RDW 18.3 (*)    Neutro Abs 12.6 (*)    Abs Immature Granulocytes 0.62 (*)    All other components within normal limits  COMPREHENSIVE METABOLIC PANEL - Abnormal; Notable for the following components:   Chloride 114 (*)    CO2 11 (*)    BUN 75 (*)    Creatinine, Ser 7.50 (*)    Calcium 7.4 (*)    Total Protein 6.4 (*)    Albumin 2.8 (*)    Alkaline Phosphatase 163 (*)    GFR, Estimated 5 (*)    Anion gap 16 (*)    All other components within normal limits  BRAIN NATRIURETIC PEPTIDE - Abnormal; Notable for the following components:   B Natriuretic Peptide 760.0 (*)    All other components within normal limits  BLOOD GAS, VENOUS -  Abnormal; Notable for the following components:   pCO2, Ven 20 (*)    pO2, Ven 58 (*)    Bicarbonate 9.9 (*)    Acid-base deficit 15.1 (*)    All other components within normal limits  TROPONIN I (HIGH SENSITIVITY) - Abnormal; Notable for the following components:   Troponin I (High Sensitivity) 33 (*)    All other components within normal limits  RESP PANEL BY RT-PCR (RSV, FLU A&B, COVID)  [id number]  LACTIC ACID, PLASMA  CBG MONITORING, ED    EKG EKG Interpretation  Date/Time:  Sunday May 16 2022 03:12:11 EST Ventricular Rate:  70 PR Interval:  108 QRS Duration: 101 QT Interval:  537 QTC Calculation: 580 R Axis:   50 Text Interpretation: Sinus rhythm Short PR interval Low voltage, extremity and precordial leads Nonspecific T abnormalities, lateral leads Prolonged QT interval Confirmed by Orpah Greek ([id number]) on 05/16/2022 3:17:34 AM  Radiology DG Chest Port 1 View  Result Date: 05/16/2022 CLINICAL DATA:  Shortness of breath EXAM: PORTABLE CHEST 1 VIEW COMPARISON:  05/07/2022 FINDINGS: Cardiac shadow is enlarged but stable. Small pleural effusion on the right is again noted. Some increased basilar density is noted on the right when compared with the prior study. Postsurgical changes in the left breast are seen. No bony abnormality is noted. IMPRESSION: Persistent right-sided effusion with slight increase in basilar infiltrate on the right. Electronically Signed   By: Inez Catalina M.D.   On: 05/16/2022 03:45    Procedures Procedures    Medications Ordered in ED Medications - No data to display  ED Course/ Medical Decision Making/ A&P                             Medical Decision Making Amount and/or Complexity of Data Reviewed External Data Reviewed: labs, radiology, ECG and notes. Labs: ordered. Decision-making details documented in ED Course. Radiology: ordered and independent interpretation performed. Decision-making details documented in ED  Course. ECG/medicine tests: ordered and independent interpretation performed. Decision-making details documented in ED Course.  Risk Prescription drug management. Decision regarding hospitalization.  Critical Care Total time providing critical care: 33  minutes   Patient presents to the emergency department for evaluation of shortness of breath.  Patient reports that she was hospitalized earlier in the week.  These records were reviewed.  She was admitted to [facility] for treatment of pneumonia.  She was treated with Rocephin and Zithromax.  During that visit, it was noted that her creatinine was elevated above her baseline.  She was ultimately discharged and was to follow-up with nephrology in the office this week but it did not happen.  Patient tachypneic with increase work of breathing at arrival.  Chest x-ray with sided pleural effusion and infiltrate.  Will treat worse today than it was during her most recent hospitalization.  Patient with a slight anion gap acidosis, likely secondary to her renal failure.  She does not have a lactic acidosis or respiratory acidosis.  Patient will require hospitalization for more broad-spectrum antibiotics and monitoring of her kidney function.        Final Clinical Impression(s) / ED Diagnoses Final diagnoses:  Pneumonia of right lower lobe due to infectious organism    Rx / DC Orders ED Discharge Orders     None         Avyana Puffenbarger, Gwenyth Allegra, MD 05/16/22 0425

## 2022-05-16 NOTE — ED Notes (Signed)
Bair hugger on pt.

## 2022-05-16 NOTE — ED Notes (Signed)
Pt transported to CT

## 2022-05-16 NOTE — ED Triage Notes (Addendum)
Pt BIB [facility] from home c/o SOB. Per EMS husband reported that pt has been retaining fluid and was recently dx with COPD and anxiety. Was admitted at [facility] 1/5-1/10 with Pneumonia  and was told to follow up with [facility] due to elevated creatinine (7.91) but did not go to appointment

## 2022-05-17 DIAGNOSIS — J189 Pneumonia, unspecified organism: Secondary | ICD-10-CM | POA: Diagnosis not present

## 2022-05-17 LAB — COMPREHENSIVE METABOLIC PANEL
ALT: 38 U/L (ref 0–44)
AST: 38 U/L (ref 15–41)
Albumin: 2.6 g/dL — ABNORMAL LOW (ref 3.5–5.0)
Alkaline Phosphatase: 260 U/L — ABNORMAL HIGH (ref 38–126)
Anion gap: 20 — ABNORMAL HIGH (ref 5–15)
BUN: 108 mg/dL — ABNORMAL HIGH (ref 8–23)
CO2: 9 mmol/L — ABNORMAL LOW (ref 22–32)
Calcium: 7.2 mg/dL — ABNORMAL LOW (ref 8.9–10.3)
Chloride: 112 mmol/L — ABNORMAL HIGH (ref 98–111)
Creatinine, Ser: 7.51 mg/dL — ABNORMAL HIGH (ref 0.44–1.00)
GFR, Estimated: 5 mL/min — ABNORMAL LOW (ref >60)
Glucose, Bld: 62 mg/dL — ABNORMAL LOW (ref 70–99)
Potassium: 4.2 mmol/L (ref 3.5–5.1)
Sodium: 141 mmol/L (ref 135–145)
Total Bilirubin: 0.8 mg/dL (ref 0.3–1.2)
Total Protein: 5.8 g/dL — ABNORMAL LOW (ref 6.5–8.1)

## 2022-05-17 LAB — GASTROINTESTINAL PANEL BY PCR, STOOL (REPLACES STOOL CULTURE)

## 2022-05-17 LAB — CBC
HCT: 28.2 % — ABNORMAL LOW (ref 36.0–46.0)
Hemoglobin: 9.1 g/dL — ABNORMAL LOW (ref 12.0–15.0)
MCH: 26.5 pg (ref 26.0–34.0)
MCHC: 32.3 g/dL (ref 30.0–36.0)
MCV: 82 fL (ref 80.0–100.0)
Platelets: 200 10*3/uL (ref 150–400)
RBC: 3.44 MIL/uL — ABNORMAL LOW (ref 3.87–5.11)
RDW: 18.7 % — ABNORMAL HIGH (ref 11.5–15.5)
WBC: 19.8 10*3/uL — ABNORMAL HIGH (ref 4.0–10.5)
nRBC: 0.1 % (ref 0.0–0.2)

## 2022-05-17 MED ORDER — SODIUM BICARBONATE 650 MG PO TABS
1300.0000 mg | ORAL_TABLET | Freq: Two times a day (BID) | ORAL | Status: DC
  Administered 2022-05-17 – 2022-05-26 (×18): 1300 mg via ORAL
  Filled 2022-05-17 (×21): qty 2

## 2022-05-17 MED ORDER — CHLORHEXIDINE GLUCONATE CLOTH 2 % EX PADS
6.0000 | MEDICATED_PAD | Freq: Every day | CUTANEOUS | Status: DC
  Administered 2022-05-17 – 2022-05-26 (×10): 6 via TOPICAL

## 2022-05-17 NOTE — Progress Notes (Signed)
PROGRESS NOTE     Sheri Cooper, is a 72 y.o. female, DOB - August 03, 1950, KGY:[id number]  Admit date - 05/16/2022   Admitting Physician Bernadette Hoit, DO  Outpatient Primary MD for the patient is [facility]  LOS - 1  Chief Complaint  Patient presents with   Shortness of Breath      Brief Summary:-  72 y.o. female with medical history significant of COPD, rheumatoid arthritis, stage V CKD  Admitted on 05/16/2022 with healthcare associated pneumonia and worsening renal function with anion gap metabolic acidosis -Patient was recently discharged from [facility] on 05/12/2022 after treatment for COPD   A/p 1)HCAP--- recent hospitalization at [facility] as outlined above -COVID, flu and RSV negative -Leukocytosis and elevated procalcitonin noted -c/n Vanc /cefepime combo -.  Bronchodilators and mucolytics as ordered   2) hypothermia--patient believes initiation Varimate she will stay with her brother and did not have much heat in the house -Weaned off [employer]   3)-CKD 5--very poor urine output, significant metabolic acidosis noted -Review of records in [place] shows that her current renal function is close to recent baseline -Anion gap metabolic acidosis noted -Nephrology consult requested - patient sees Dr. Theador Hawthorne as outpatient---discussed with Dr. Lars Masson is concerned that patient may need hemodialysis this admission -Care Everywhere records from [facility] reviewed - renally adjust medications, avoid nephrotoxic agents / dehydration  / hypotension   4) COPD--continue bronchodilators -Hold off on steroids   5) chronic anemia----multifactorial suspect partly related to underlying CKD 5 -Labs pending -No evidence of ongoing blood loss   6)Incidental finding of multiple renal lesions CT angiography of chest showed multiple renal lesions bilaterally, incompletely characterized on today's noncontrast CT examination, including  an indeterminate 1.9 cm lesion in the anterior aspect of the interpolar region of the left kidney.  Follow-up nonemergent outpatient abdominal MRI with and without IV gadolinium is recommended in the near future to definitively evaluate this lesion and exclude neoplasm   7)Elevated BNP--echo from 05/10/2022 at [facility] available in [place] reveals-  The left ventricle is normal in size with normal wall thickness. The left ventricular systolic function is normal, LVEF is visually estimated at 60-65%.  There is normal left ventricular diastolic function.  -There is mild to moderate pulmonary hypertension no significant valvular abnormalities   8)Loose Stools -x 3 days.... Recent hospitalizations at [facility] and recent exposure to antibiotics -Check stool for C. difficile and GI pathogen  Status is: Inpatient   Disposition: The patient is from: Home              Anticipated d/c is to: Home              Anticipated d/c date is: 2 days              Patient currently is not medically stable to d/c. Barriers: Not Clinically Stable-   Code Status :  -  Code Status: Full Code   Family Communication:    NA (patient is alert, awake and coherent)   DVT Prophylaxis  :   - SCDs   heparin injection 5,000 Units Start: 05/16/22 0645 SCDs Start: 05/16/22 0644   Lab Results  Component Value Date   PLT 200 05/17/2022    Inpatient Medications  Scheduled Meds:  amLODipine  10 mg Oral Daily   Chlorhexidine Gluconate Cloth  6 each Topical Daily   dextromethorphan-guaiFENesin  1 tablet Oral BID   feeding supplement  237 mL Oral BID  BM   heparin  5,000 Units Subcutaneous Q8H   sodium bicarbonate  1,300 mg Oral BID   Continuous Infusions:  ceFEPime (MAXIPIME) IV Stopped (05/17/22 1045)   PRN Meds:.acetaminophen **OR** acetaminophen, hydrALAZINE, ipratropium-albuterol, oxyCODONE, prochlorperazine   Anti-infectives (From admission, onward)    Start     Dose/Rate Route Frequency  Ordered Stop   05/17/22 0800  ceFEPIme (MAXIPIME) 1 g in sodium chloride 0.9 % 100 mL IVPB        1 g 200 mL/hr over 30 Minutes Intravenous Every 24 hours 05/16/22 0641     05/16/22 0641  vancomycin variable dose per unstable renal function (pharmacist dosing)  Status:  Discontinued         Does not apply See admin instructions 05/16/22 0641 05/17/22 1605   05/16/22 0445  vancomycin (VANCOREADY) IVPB 1250 mg/250 mL        1,250 mg 166.7 mL/hr over 90 Minutes Intravenous  Once 05/16/22 0444 05/16/22 0732   05/16/22 0430  ceFEPIme (MAXIPIME) 2 g in sodium chloride 0.9 % 100 mL IVPB        2 g 200 mL/hr over 30 Minutes Intravenous  Once 05/16/22 0426 05/16/22 0534         Subjective: Sheri Cooper today has no fevers, no emesis,  No chest pain,   - Very poor urine output   Objective: Vitals:   05/17/22 1500 05/17/22 1600 05/17/22 1700 05/17/22 1800  BP:  (!) 166/36 (!) 156/36 (!) 170/44  Pulse: 82 79 78 76  Resp: '12 12 12 11  '$ Temp:      TempSrc:      SpO2: 97% 97% 96% 96%  Weight:      Height:        Intake/Output Summary (Last 24 hours) at 05/17/2022 1923 Last data filed at 05/17/2022 1423 Gross per 24 hour  Intake 280 ml  Output 300 ml  Net -20 ml   Filed Weights   05/16/22 0310 05/16/22 1215 05/17/22 0457  Weight: 57.2 kg 56.2 kg 56.4 kg    Physical Exam  Gen:- Awake Alert,  in no apparent distress  HEENT:- [place].AT, No sclera icterus Neck-Supple Neck,No JVD,.  Lungs-  CTAB , fair symmetrical air movement CV- S1, S2 normal, regular  Abd-  +ve B.Sounds, Abd Soft, No tenderness,    Extremity/Skin:-   pedal pulses present  Psych-affect is appropriate, oriented x3 Neuro-no new focal deficits, no tremors  Data Reviewed: I have personally reviewed following labs and imaging studies  CBC: Recent Labs  Lab 05/16/22 0309 05/17/22 0757  WBC 15.3* 19.8*  NEUTROABS 12.6*  --   HGB 10.1* 9.1*  HCT 30.6* 28.2*  MCV 79.1* 82.0  PLT 254 366   Basic Metabolic  Panel: Recent Labs  Lab 05/16/22 0309 05/16/22 0711 05/17/22 0402  NA 141  --  141  K 3.8  --  4.2  CL 114*  --  112*  CO2 11*  --  9*  GLUCOSE 86  --  62*  BUN 75*  --  108*  CREATININE 7.50*  --  7.51*  CALCIUM 7.4*  --  7.2*  MG  --  2.6*  --   PHOS  --  11.9*  --    GFR: Estimated Creatinine Clearance: 5.2 mL/min (A) (by C-G formula based on SCr of 7.51 mg/dL (H)). Liver Function Tests: Recent Labs  Lab 05/16/22 0309 05/17/22 0402  AST 23 38  ALT 28 38  ALKPHOS 163* 260*  BILITOT 0.9  0.8  PROT 6.4* 5.8*  ALBUMIN 2.8* 2.6*   Recent Results (from the past 240 hour(s))  Resp panel by RT-PCR (RSV, Flu A&B, Covid) Anterior Nasal Swab     Status: None   Collection Time: 05/16/22  3:33 AM   Specimen: Anterior Nasal Swab  Result Value Ref Range Status   SARS Coronavirus 2 by RT PCR NEGATIVE NEGATIVE Final    Comment: (NOTE) SARS-CoV-2 target nucleic acids are NOT DETECTED.  The SARS-CoV-2 RNA is generally detectable in upper respiratory specimens during the acute phase of infection. The lowest concentration of SARS-CoV-2 viral copies this assay can detect is 138 copies/mL. A negative result does not preclude SARS-Cov-2 infection and should not be used as the sole basis for treatment or other patient management decisions. A negative result may occur with  improper specimen collection/handling, submission of specimen other than nasopharyngeal swab, presence of viral mutation(s) within the areas targeted by this assay, and inadequate number of viral copies(<138 copies/mL). A negative result must be combined with clinical observations, patient history, and epidemiological information. The expected result is Negative.  Fact Sheet for Patients:  EntrepreneurPulse.com.au  Fact Sheet for Healthcare Providers:  IncredibleEmployment.be  This test is no t yet approved or cleared by the [place] FDA and  has been authorized for  detection and/or diagnosis of SARS-CoV-2 by FDA under an Emergency Use Authorization (EUA). This EUA will remain  in effect (meaning this test can be used) for the duration of the COVID-19 declaration under Section 564(b)(1) of the Act, 21 U.S.C.section 360bbb-3(b)(1), unless the authorization is terminated  or revoked sooner.       Influenza A by PCR NEGATIVE NEGATIVE Final   Influenza B by PCR NEGATIVE NEGATIVE Final    Comment: (NOTE) The Xpert Xpress SARS-CoV-2/FLU/RSV plus assay is intended as an aid in the diagnosis of influenza from Nasopharyngeal swab specimens and should not be used as a sole basis for treatment. Nasal washings and aspirates are unacceptable for Xpert Xpress SARS-CoV-2/FLU/RSV testing.  Fact Sheet for Patients: EntrepreneurPulse.com.au  Fact Sheet for Healthcare Providers: IncredibleEmployment.be  This test is not yet approved or cleared by the [place] FDA and has been authorized for detection and/or diagnosis of SARS-CoV-2 by FDA under an Emergency Use Authorization (EUA). This EUA will remain in effect (meaning this test can be used) for the duration of the COVID-19 declaration under Section 564(b)(1) of the Act, 21 U.S.C. section 360bbb-3(b)(1), unless the authorization is terminated or revoked.     Resp Syncytial Virus by PCR NEGATIVE NEGATIVE Final    Comment: (NOTE) Fact Sheet for Patients: EntrepreneurPulse.com.au  Fact Sheet for Healthcare Providers: IncredibleEmployment.be  This test is not yet approved or cleared by the [place] FDA and has been authorized for detection and/or diagnosis of SARS-CoV-2 by FDA under an Emergency Use Authorization (EUA). This EUA will remain in effect (meaning this test can be used) for the duration of the COVID-19 declaration under Section 564(b)(1) of the Act, 21 U.S.C. section 360bbb-3(b)(1), unless the authorization is  terminated or revoked.  Performed at [facility], [address] [place], [place] [address]   Culture, blood (routine x 2) Call MD if unable to obtain prior to antibiotics being given     Status: None (Preliminary result)   Collection Time: 05/16/22  6:44 AM   Specimen: BLOOD RIGHT FOREARM  Result Value Ref Range Status   Specimen Description   Final    BLOOD RIGHT FOREARM BOTTLES DRAWN AEROBIC AND ANAEROBIC  Special Requests Blood Culture adequate volume  Final   Culture   Final    NO GROWTH < 24 HOURS Performed at [facility], [address], [place], [place] [address]    Report Status PENDING  Incomplete  Culture, blood (routine x 2) Call MD if unable to obtain prior to antibiotics being given     Status: None (Preliminary result)   Collection Time: 05/16/22  7:11 AM   Specimen: BLOOD LEFT ARM  Result Value Ref Range Status   Specimen Description BLOOD LEFT ARM BOTTLES DRAWN AEROBIC AND ANAEROBIC  Final   Special Requests Blood Culture adequate volume  Final   Culture   Final    NO GROWTH < 24 HOURS Performed at [facility], [address], [place], [place] [address]    Report Status PENDING  Incomplete  MRSA Next Gen by PCR, Nasal     Status: None   Collection Time: 05/16/22 12:10 PM   Specimen: Nasal Mucosa; Nasal Swab  Result Value Ref Range Status   MRSA by PCR Next Gen NOT DETECTED NOT DETECTED Final    Comment: (NOTE) The GeneXpert MRSA Assay (FDA approved for NASAL specimens only), is one component of a comprehensive MRSA colonization surveillance program. It is not intended to diagnose MRSA infection nor to guide or monitor treatment for MRSA infections. Test performance is not FDA approved in patients less than 63 years old. Performed at [facility], [address], [place], [place] [address]   C Difficile Quick Screen w PCR reflex     Status: None   Collection Time: 05/16/22 12:10 PM   Specimen: STOOL  Result Value Ref Range Status   C Diff antigen NEGATIVE  NEGATIVE Final   C Diff toxin NEGATIVE NEGATIVE Final   C Diff interpretation No C. difficile detected.  Final    Comment: Performed at [facility], [address] [place], [place] [address]      Radiology Studies: CT Chest Wo Contrast  Result Date: 05/16/2022 CLINICAL DATA:  72 year old female with history of suspected pneumonia. EXAM: CT CHEST WITHOUT CONTRAST TECHNIQUE: Multidetector CT imaging of the chest was performed following the standard protocol without IV contrast. RADIATION DOSE REDUCTION: This exam was performed according to the departmental dose-optimization program which includes automated exposure control, adjustment of the mA and/or kV according to patient size and/or use of iterative reconstruction technique. COMPARISON:  Chest x-ray 05/16/2022. FINDINGS: Cardiovascular: Heart size is normal. There is no significant pericardial fluid, thickening or pericardial calcification. Aortic atherosclerosis. No definite coronary artery calcifications. Mild calcifications of the aortic valve. Mediastinum/Nodes: No pathologically enlarged mediastinal or hilar lymph nodes. Please note that accurate exclusion of hilar adenopathy is limited on noncontrast CT scans. Esophagus is unremarkable in appearance. No axillary lymphadenopathy. Lungs/Pleura: Moderate to large right and small to moderate left pleural effusions lying dependently associated with areas of passive subsegmental atelectasis in the lower lobes of the lungs bilaterally. In addition, there are mid to upper lung predominant patchy areas of ground-glass attenuation and nodular appearing consolidative airspace disease, likely to reflect multilobar bilateral bronchopneumonia. The largest of these nodular appearing regions in the apex of the left upper lobe (axial image 32 of series 4) measuring 1.3 x 1.2 cm. Upper Abdomen: Aortic atherosclerosis. Tiny partially calcified gallstones lying dependently in the fundus of the gallbladder. Small  volume of ascites. Multiple renal lesions bilaterally, incompletely characterized on today's noncontrast CT examination, most notable for an exophytic intermediate attenuation lesion (25 HU) measuring 1.9 cm  in the anterior aspect of the interpolar region of the left kidney. A cluster of nonobstructive calculi is also noted in the posterior aspect of the upper pole collecting system of the right kidney measuring up to 3 mm. Musculoskeletal: There are no aggressive appearing lytic or blastic lesions noted in the visualized portions of the skeleton. IMPRESSION: 1. The appearance the chest is most suggestive of multilobar bilateral bronchopneumonia with bilateral parapneumonic pleural effusions, as above. 2. Given the nodular appearance of some of the areas of apparent airspace consolidation, follow-up noncontrast chest CT is recommended in 3 months to ensure resolution of the nodular areas following treatment for the patient's acute illness, to exclude underlying neoplasm. 3. Multiple renal lesions bilaterally, incompletely characterized on today's noncontrast CT examination, including an indeterminate 1.9 cm lesion in the anterior aspect of the interpolar region of the left kidney. Follow-up nonemergent outpatient abdominal MRI with and without IV gadolinium is recommended in the near future to definitively evaluate this lesion and exclude neoplasm. 4. Cholelithiasis. 5. Nonobstructive calculi in the right renal collecting system measuring up to 3 mm. 6. Ascites. 7. Aortic atherosclerosis. Aortic Atherosclerosis (ICD10-I70.0). Electronically Signed   By: Vinnie Langton M.D.   On: 05/16/2022 05:23   DG Chest Port 1 View  Result Date: 05/16/2022 CLINICAL DATA:  Shortness of breath EXAM: PORTABLE CHEST 1 VIEW COMPARISON:  05/07/2022 FINDINGS: Cardiac shadow is enlarged but stable. Small pleural effusion on the right is again noted. Some increased basilar density is noted on the right when compared with the prior  study. Postsurgical changes in the left breast are seen. No bony abnormality is noted. IMPRESSION: Persistent right-sided effusion with slight increase in basilar infiltrate on the right. Electronically Signed   By: Inez Catalina M.D.   On: 05/16/2022 03:45     Scheduled Meds:  amLODipine  10 mg Oral Daily   Chlorhexidine Gluconate Cloth  6 each Topical Daily   dextromethorphan-guaiFENesin  1 tablet Oral BID   feeding supplement  237 mL Oral BID BM   heparin  5,000 Units Subcutaneous Q8H   sodium bicarbonate  1,300 mg Oral BID   Continuous Infusions:  ceFEPime (MAXIPIME) IV Stopped (05/17/22 1045)     LOS: 1 day    Roxan Hockey M.D on 05/17/2022 at 7:23 PM  Go to [email or web address] - for contact info  [facility] - Office  [phone]  If 7PM-7AM, please contact night-coverage [email or web address] 05/17/2022, 7:23 PM

## 2022-05-17 NOTE — Progress Notes (Signed)
Unable to obtain urine sample as a Nun's hat was placed in the [facility] to just get a urine sample, yet patient had a small BM so that got mixed within. Will try to obtain again when patient needs to urinate again.

## 2022-05-17 NOTE — Plan of Care (Signed)
  Problem: Coping: °Goal: Level of anxiety will decrease °Outcome: Progressing °  °

## 2022-05-18 ENCOUNTER — Inpatient Hospital Stay (HOSPITAL_COMMUNITY): Payer: Medicare Other

## 2022-05-18 DIAGNOSIS — J189 Pneumonia, unspecified organism: Secondary | ICD-10-CM | POA: Diagnosis not present

## 2022-05-18 DIAGNOSIS — N185 Chronic kidney disease, stage 5: Secondary | ICD-10-CM | POA: Diagnosis not present

## 2022-05-18 LAB — RENAL FUNCTION PANEL
Albumin: 2.4 g/dL — ABNORMAL LOW (ref 3.5–5.0)
Anion gap: 22 — ABNORMAL HIGH (ref 5–15)
BUN: 118 mg/dL — ABNORMAL HIGH (ref 8–23)
CO2: 8 mmol/L — ABNORMAL LOW (ref 22–32)
Calcium: 7.2 mg/dL — ABNORMAL LOW (ref 8.9–10.3)
Chloride: 112 mmol/L — ABNORMAL HIGH (ref 98–111)
Creatinine, Ser: 8.12 mg/dL — ABNORMAL HIGH (ref 0.44–1.00)
GFR, Estimated: 5 mL/min — ABNORMAL LOW (ref >60)
Glucose, Bld: 57 mg/dL — ABNORMAL LOW (ref 70–99)
Phosphorus: 12.8 mg/dL — ABNORMAL HIGH (ref 2.5–4.6)
Potassium: 3.9 mmol/L (ref 3.5–5.1)
Sodium: 142 mmol/L (ref 135–145)

## 2022-05-18 LAB — CBC
HCT: 26.2 % — ABNORMAL LOW (ref 36.0–46.0)
Hemoglobin: 8.5 g/dL — ABNORMAL LOW (ref 12.0–15.0)
MCH: 26.4 pg (ref 26.0–34.0)
MCHC: 32.4 g/dL (ref 30.0–36.0)
MCV: 81.4 fL (ref 80.0–100.0)
Platelets: 157 10*3/uL (ref 150–400)
RBC: 3.22 MIL/uL — ABNORMAL LOW (ref 3.87–5.11)
RDW: 18.8 % — ABNORMAL HIGH (ref 11.5–15.5)
WBC: 20.2 10*3/uL — ABNORMAL HIGH (ref 4.0–10.5)
nRBC: 0 % (ref 0.0–0.2)

## 2022-05-18 LAB — URINALYSIS, ROUTINE W REFLEX MICROSCOPIC
Bilirubin Urine: NEGATIVE
Glucose, UA: NEGATIVE mg/dL
Ketones, ur: 5 mg/dL — AB
Nitrite: NEGATIVE
Protein, ur: >=300 mg/dL — AB
RBC / HPF: >50 RBC/hpf — ABNORMAL HIGH (ref 0–5)
Specific Gravity, Urine: 1.014 (ref 1.005–1.030)
Trans Epithel, UA: 2
WBC, UA: >50 WBC/hpf — ABNORMAL HIGH (ref 0–5)
pH: 5 (ref 5.0–8.0)

## 2022-05-18 LAB — GLUCOSE, CAPILLARY
Glucose-Capillary: 64 mg/dL — ABNORMAL LOW (ref 70–99)
Glucose-Capillary: 66 mg/dL — ABNORMAL LOW (ref 70–99)
Glucose-Capillary: 83 mg/dL (ref 70–99)

## 2022-05-18 LAB — PROTEIN / CREATININE RATIO, URINE
Creatinine, Urine: 103.99 mg/dL
Protein Creatinine Ratio: 7.81 mg/mg{Cre} — ABNORMAL HIGH (ref 0.00–0.15)
Total Protein, Urine: 812 mg/dL

## 2022-05-18 LAB — HEPATITIS B CORE ANTIBODY, TOTAL: Hep B Core Total Ab: NONREACTIVE

## 2022-05-18 LAB — SEDIMENTATION RATE: Sed Rate: 76 mm/hr — ABNORMAL HIGH (ref 0–22)

## 2022-05-18 LAB — HEPATITIS B SURFACE ANTIGEN: Hepatitis B Surface Ag: NONREACTIVE

## 2022-05-18 MED ORDER — FENTANYL CITRATE (PF) 100 MCG/2ML IJ SOLN
INTRAMUSCULAR | Status: AC
  Filled 2022-05-18: qty 2

## 2022-05-18 MED ORDER — HEPARIN SODIUM (PORCINE) 1000 UNIT/ML DIALYSIS
1000.0000 [IU] | INTRAMUSCULAR | Status: DC | PRN
  Administered 2022-05-29: 1000 [IU]
  Filled 2022-05-18: qty 1

## 2022-05-18 MED ORDER — HEPARIN SODIUM (PORCINE) 5000 UNIT/ML IJ SOLN
5000.0000 [IU] | Freq: Three times a day (TID) | INTRAMUSCULAR | Status: DC
  Administered 2022-05-20: 5000 [IU] via SUBCUTANEOUS
  Filled 2022-05-18: qty 1

## 2022-05-18 MED ORDER — LIDOCAINE HCL (CARDIAC) PF 100 MG/5ML IV SOSY
PREFILLED_SYRINGE | INTRAVENOUS | Status: AC
  Filled 2022-05-18: qty 5

## 2022-05-18 MED ORDER — LIDOCAINE HCL (PF) 1 % IJ SOLN
INTRAMUSCULAR | Status: AC
  Filled 2022-05-18: qty 5

## 2022-05-18 MED ORDER — DARBEPOETIN ALFA 150 MCG/0.3ML IJ SOSY
150.0000 ug | PREFILLED_SYRINGE | INTRAMUSCULAR | Status: DC
  Administered 2022-05-18: 150 ug via SUBCUTANEOUS
  Filled 2022-05-18 (×2): qty 0.3

## 2022-05-18 MED ORDER — HYDRALAZINE HCL 25 MG PO TABS
50.0000 mg | ORAL_TABLET | Freq: Three times a day (TID) | ORAL | Status: DC
  Administered 2022-05-19 – 2022-05-27 (×21): 50 mg via ORAL
  Filled 2022-05-18 (×25): qty 2

## 2022-05-18 MED ORDER — FUROSEMIDE 10 MG/ML IJ SOLN
80.0000 mg | Freq: Two times a day (BID) | INTRAMUSCULAR | Status: DC
  Administered 2022-05-18 – 2022-05-20 (×5): 80 mg via INTRAVENOUS
  Filled 2022-05-18 (×5): qty 8

## 2022-05-18 MED ORDER — LOPERAMIDE HCL 2 MG PO CAPS
2.0000 mg | ORAL_CAPSULE | Freq: Four times a day (QID) | ORAL | Status: DC | PRN
  Administered 2022-05-19 – 2022-05-21 (×2): 2 mg via ORAL
  Filled 2022-05-18 (×2): qty 1

## 2022-05-18 MED ORDER — ALTEPLASE 2 MG IJ SOLR: 2.0000 mg | Freq: Once | INTRAMUSCULAR | Status: DC | PRN

## 2022-05-18 MED ORDER — CHLORHEXIDINE GLUCONATE CLOTH 2 % EX PADS
6.0000 | MEDICATED_PAD | Freq: Every day | CUTANEOUS | Status: DC
  Administered 2022-05-18 – 2022-05-27 (×10): 6 via TOPICAL

## 2022-05-18 MED ORDER — LABETALOL HCL 5 MG/ML IV SOLN
10.0000 mg | INTRAVENOUS | Status: DC | PRN
  Administered 2022-05-18 – 2022-06-04 (×11): 10 mg via INTRAVENOUS
  Filled 2022-05-18 (×11): qty 4

## 2022-05-18 MED ORDER — SODIUM CHLORIDE 0.9 % IV SOLN
1000.0000 mg | Freq: Every day | INTRAVENOUS | Status: AC
  Administered 2022-05-18 – 2022-05-20 (×3): 1000 mg via INTRAVENOUS
  Filled 2022-05-18 (×3): qty 16

## 2022-05-18 MED ORDER — PANTOPRAZOLE SODIUM 40 MG PO TBEC
40.0000 mg | DELAYED_RELEASE_TABLET | Freq: Every day | ORAL | Status: DC
  Administered 2022-05-18 – 2022-05-21 (×4): 40 mg via ORAL
  Filled 2022-05-18 (×6): qty 1

## 2022-05-18 NOTE — Progress Notes (Signed)
PROGRESS NOTE     Sheri Cooper, is a 72 y.o. female, DOB - 10/15/1950, TTS:[id number]  Admit date - 05/16/2022   Admitting Physician Bernadette Hoit, DO  Outpatient Primary MD for the patient is [facility]  LOS - 2  Chief Complaint  Patient presents with   Shortness of Breath      Brief Summary:-  72 y.o. female with medical history significant of COPD, rheumatoid arthritis, stage V CKD  Admitted on 05/16/2022 with healthcare associated pneumonia and worsening renal function with anion gap metabolic acidosis -Patient was recently discharged from [facility] on 05/12/2022 after treatment for COPD   A/p 1)HCAP--- recent hospitalization at [facility] as outlined above -COVID, flu and RSV negative -Leukocytosis and elevated procalcitonin noted WBC 15.3 >> 19.8 >>20.2 -c/n Vanc /cefepime combo -.  Bronchodilators and mucolytics as ordered   2) Social/Ethics--plan of care and advanced directive discussed with patient and patient's brother Trilby Drummer -Patient is a full code -No limitations to treatment   3)-CKD 5--very poor urine output, significant metabolic acidosis noted, becoming more sleepy -Review of records in [place] shows that her current renal function is close to recent baseline -Anion gap metabolic acidosis noted 0/92/33 - patient sees Dr. Theador Hawthorne as outpatient---discussed with Dr. Lars Masson is concerned that patient may need hemodialysis this admission -Care Everywhere records from [facility] reviewed - 05/18/22 Discussed with Dr. Vanetta Mulders from [facility], official nephrology consult appreciated -Right IJ HD catheter placed -Initiated on hemodialysis on 05/18/2022 -Plans for IR for renal biopsy on 05/19/2022 at [facility] -- renally adjust medications, avoid nephrotoxic agents / dehydration  / hypotension   4)COPD--continue bronchodilators -Hold off on steroids   5) chronic Anemia----multifactorial suspect partly  related to underlying CKD 5 --Ferritin and serum iron are not low -No evidence of ongoing blood loss   6)Incidental finding of multiple renal lesions CT angiography of chest showed multiple renal lesions bilaterally, incompletely characterized on today's noncontrast CT examination, including an indeterminate 1.9 cm lesion in the anterior aspect of the interpolar region of the left kidney.  Follow-up nonemergent outpatient abdominal MRI with and without IV gadolinium is recommended in the near future to definitively evaluate this lesion and exclude neoplasm   7)Elevated BNP--echo from 05/10/2022 at [facility] available in [place] reveals-  The left ventricle is normal in size with normal wall thickness. The left ventricular systolic function is normal, LVEF is visually estimated at 60-65%.  There is normal left ventricular diastolic function.  -There is mild to moderate pulmonary hypertension no significant valvular abnormalities -Use hemodialysis to address volume status   8)Loose Stools -x 3 days.... Recent hospitalizations at [facility] and recent exposure to antibiotics - stool for C. difficile and GI pathogen--negative -Diarrhea improving -Use Imodium as needed  Procedures:- -Right IJ HD catheter placed on 05/18/22 -Initiated on hemodialysis on 05/18/2022 -Plans for  IR for renal biopsy on 05/19/2022 at [facility]  Status is: Inpatient   Disposition: The patient is from: Home              Anticipated d/c is to: Home Vs SNF--patient will need to be clipped and patient will need outpatient hemodialysis chair prior to discharge              Anticipated d/c date is: 2 days              Patient currently is not medically stable to d/c. Barriers: Not Clinically Stable-   Code Status :  -  Code Status: Full Code   Family Communication:    NA (patient is alert, awake and coherent)   DVT Prophylaxis  :   - SCDs   heparin injection 5,000 Units Start: 05/20/22 0600 SCDs Start:  05/16/22 0644   Lab Results  Component Value Date   PLT 157 05/18/2022    Inpatient Medications  Scheduled Meds:  amLODipine  10 mg Oral Daily   Chlorhexidine Gluconate Cloth  6 each Topical Daily   Chlorhexidine Gluconate Cloth  6 each Topical Q0600   darbepoetin (ARANESP) injection - DIALYSIS  150 mcg Subcutaneous Q Tue-1800   dextromethorphan-guaiFENesin  1 tablet Oral BID   feeding supplement  237 mL Oral BID BM   furosemide  80 mg Intravenous Q12H   [START ON 05/20/2022] heparin  5,000 Units Subcutaneous Q8H   [START ON 05/19/2022] hydrALAZINE  50 mg Oral TID   pantoprazole  40 mg Oral Daily   sodium bicarbonate  1,300 mg Oral BID   Continuous Infusions:  ceFEPime (MAXIPIME) IV 1 g (05/18/22 0944)   methylPREDNISolone (SOLU-MEDROL) injection 1,000 mg (05/18/22 1226)   PRN Meds:.acetaminophen **OR** acetaminophen, alteplase, heparin, ipratropium-albuterol, labetalol, oxyCODONE, prochlorperazine   Anti-infectives (From admission, onward)    Start     Dose/Rate Route Frequency Ordered Stop   05/17/22 0800  ceFEPIme (MAXIPIME) 1 g in sodium chloride 0.9 % 100 mL IVPB        1 g 200 mL/hr over 30 Minutes Intravenous Every 24 hours 05/16/22 0641     05/16/22 0641  vancomycin variable dose per unstable renal function (pharmacist dosing)  Status:  Discontinued         Does not apply See admin instructions 05/16/22 0641 05/17/22 1605   05/16/22 0445  vancomycin (VANCOREADY) IVPB 1250 mg/250 mL        1,250 mg 166.7 mL/hr over 90 Minutes Intravenous  Once 05/16/22 0444 05/16/22 0732   05/16/22 0430  ceFEPIme (MAXIPIME) 2 g in sodium chloride 0.9 % 100 mL IVPB        2 g 200 mL/hr over 30 Minutes Intravenous  Once 05/16/22 0426 05/16/22 0534         Subjective: Sheri Cooper today has no fevers, no emesis,  No chest pain,   - Very poor urine output -Becoming somewhat sleepy -Tolerated right IJ HD catheter placement well   Objective: Vitals:   05/18/22 1700  05/18/22 1800 05/18/22 1815 05/18/22 1830  BP: (!) 164/33 (!) 163/33 (!) 170/38 (!) 153/40  Pulse: 78 79    Resp: '11 12 13 12  '$ Temp:  97.8 F (36.6 C)    TempSrc:  Oral    SpO2: 97% 97%    Weight:  56.2 kg    Height:        Intake/Output Summary (Last 24 hours) at 05/18/2022 1845 Last data filed at 05/18/2022 0000 Gross per 24 hour  Intake --  Output 90 ml  Net -90 ml   Filed Weights   05/17/22 0457 05/18/22 0500 05/18/22 1800  Weight: 56.4 kg 55.9 kg 56.2 kg    Physical Exam  Gen:-Somewhat sleepy, in no apparent distress  HEENT:- [place].AT, No sclera icterus Neck-Supple Neck, right IJ hemodialysis catheter Lungs-slightly diminished in bases, no wheezing CV- S1, S2 normal, regular  Abd-  +ve B.Sounds, Abd Soft, No tenderness,    Extremity/Skin:-  pitting edema,  pedal pulses present  Psych-affect is appropriate, oriented x3, somewhat sleepy Neuro-generalized weakness no new focal deficits, no tremors  Data Reviewed:  I have personally reviewed following labs and imaging studies  CBC: Recent Labs  Lab 05/16/22 0309 05/17/22 0757 05/18/22 0300  WBC 15.3* 19.8* 20.2*  NEUTROABS 12.6*  --   --   HGB 10.1* 9.1* 8.5*  HCT 30.6* 28.2* 26.2*  MCV 79.1* 82.0 81.4  PLT 254 200 403   Basic Metabolic Panel: Recent Labs  Lab 05/16/22 0309 05/16/22 0711 05/17/22 0402 05/18/22 0300  NA 141  --  141 142  K 3.8  --  4.2 3.9  CL 114*  --  112* 112*  CO2 11*  --  9* 8*  GLUCOSE 86  --  62* 57*  BUN 75*  --  108* 118*  CREATININE 7.50*  --  7.51* 8.12*  CALCIUM 7.4*  --  7.2* 7.2*  MG  --  2.6*  --   --   PHOS  --  11.9*  --  12.8*   GFR: Estimated Creatinine Clearance: 4.8 mL/min (A) (by C-G formula based on SCr of 8.12 mg/dL (H)). Liver Function Tests: Recent Labs  Lab 05/16/22 0309 05/17/22 0402 05/18/22 0300  AST 23 38  --   ALT 28 38  --   ALKPHOS 163* 260*  --   BILITOT 0.9 0.8  --   PROT 6.4* 5.8*  --   ALBUMIN 2.8* 2.6* 2.4*   Recent Results (from the  past 240 hour(s))  Resp panel by RT-PCR (RSV, Flu A&B, Covid) Anterior Nasal Swab     Status: None   Collection Time: 05/16/22  3:33 AM   Specimen: Anterior Nasal Swab  Result Value Ref Range Status   SARS Coronavirus 2 by RT PCR NEGATIVE NEGATIVE Final    Comment: (NOTE) SARS-CoV-2 target nucleic acids are NOT DETECTED.  The SARS-CoV-2 RNA is generally detectable in upper respiratory specimens during the acute phase of infection. The lowest concentration of SARS-CoV-2 viral copies this assay can detect is 138 copies/mL. A negative result does not preclude SARS-Cov-2 infection and should not be used as the sole basis for treatment or other patient management decisions. A negative result may occur with  improper specimen collection/handling, submission of specimen other than nasopharyngeal swab, presence of viral mutation(s) within the areas targeted by this assay, and inadequate number of viral copies(<138 copies/mL). A negative result must be combined with clinical observations, patient history, and epidemiological information. The expected result is Negative.  Fact Sheet for Patients:  EntrepreneurPulse.com.au  Fact Sheet for Healthcare Providers:  IncredibleEmployment.be  This test is no t yet approved or cleared by the [place] FDA and  has been authorized for detection and/or diagnosis of SARS-CoV-2 by FDA under an Emergency Use Authorization (EUA). This EUA will remain  in effect (meaning this test can be used) for the duration of the COVID-19 declaration under Section 564(b)(1) of the Act, 21 U.S.C.section 360bbb-3(b)(1), unless the authorization is terminated  or revoked sooner.       Influenza A by PCR NEGATIVE NEGATIVE Final   Influenza B by PCR NEGATIVE NEGATIVE Final    Comment: (NOTE) The Xpert Xpress SARS-CoV-2/FLU/RSV plus assay is intended as an aid in the diagnosis of influenza from Nasopharyngeal swab specimens  and should not be used as a sole basis for treatment. Nasal washings and aspirates are unacceptable for Xpert Xpress SARS-CoV-2/FLU/RSV testing.  Fact Sheet for Patients: EntrepreneurPulse.com.au  Fact Sheet for Healthcare Providers: IncredibleEmployment.be  This test is not yet approved or cleared by the [place] FDA and has been authorized for detection  and/or diagnosis of SARS-CoV-2 by FDA under an Emergency Use Authorization (EUA). This EUA will remain in effect (meaning this test can be used) for the duration of the COVID-19 declaration under Section 564(b)(1) of the Act, 21 U.S.C. section 360bbb-3(b)(1), unless the authorization is terminated or revoked.     Resp Syncytial Virus by PCR NEGATIVE NEGATIVE Final    Comment: (NOTE) Fact Sheet for Patients: EntrepreneurPulse.com.au  Fact Sheet for Healthcare Providers: IncredibleEmployment.be  This test is not yet approved or cleared by the [place] FDA and has been authorized for detection and/or diagnosis of SARS-CoV-2 by FDA under an Emergency Use Authorization (EUA). This EUA will remain in effect (meaning this test can be used) for the duration of the COVID-19 declaration under Section 564(b)(1) of the Act, 21 U.S.C. section 360bbb-3(b)(1), unless the authorization is terminated or revoked.  Performed at [facility], [address], [place], [place] [address]   Culture, blood (routine x 2) Call MD if unable to obtain prior to antibiotics being given     Status: None (Preliminary result)   Collection Time: 05/16/22  6:44 AM   Specimen: BLOOD RIGHT FOREARM  Result Value Ref Range Status   Specimen Description   Final    BLOOD RIGHT FOREARM BOTTLES DRAWN AEROBIC AND ANAEROBIC   Special Requests Blood Culture adequate volume  Final   Culture   Final    NO GROWTH 2 DAYS Performed at [facility], [address], [place], [place] [address]     Report Status PENDING  Incomplete  Culture, blood (routine x 2) Call MD if unable to obtain prior to antibiotics being given     Status: None (Preliminary result)   Collection Time: 05/16/22  7:11 AM   Specimen: BLOOD LEFT ARM  Result Value Ref Range Status   Specimen Description BLOOD LEFT ARM BOTTLES DRAWN AEROBIC AND ANAEROBIC  Final   Special Requests Blood Culture adequate volume  Final   Culture   Final    NO GROWTH 2 DAYS Performed at [facility], [address], [place], [place] [address]    Report Status PENDING  Incomplete  MRSA Next Gen by PCR, Nasal     Status: None   Collection Time: 05/16/22 12:10 PM   Specimen: Nasal Mucosa; Nasal Swab  Result Value Ref Range Status   MRSA by PCR Next Gen NOT DETECTED NOT DETECTED Final    Comment: (NOTE) The GeneXpert MRSA Assay (FDA approved for NASAL specimens only), is one component of a comprehensive MRSA colonization surveillance program. It is not intended to diagnose MRSA infection nor to guide or monitor treatment for MRSA infections. Test performance is not FDA approved in patients less than 73 years old. Performed at [facility], [address] [place], [place] [address]   C Difficile Quick Screen w PCR reflex     Status: None   Collection Time: 05/16/22 12:10 PM   Specimen: STOOL  Result Value Ref Range Status   C Diff antigen NEGATIVE NEGATIVE Final   C Diff toxin NEGATIVE NEGATIVE Final   C Diff interpretation No C. difficile detected.  Final    Comment: Performed at [facility], [address], [place],  [address]  Stool culture     Status: None (Preliminary result)   Collection Time: 05/16/22 12:10 PM   Specimen: Stool  Result Value Ref Range Status   Salmonella/Shigella Screen Final report  Final   Campylobacter Culture PENDING  Incomplete   E coli, Shiga toxin Assay  Negative Negative Final    Comment: (NOTE) Performed At: [facility] [place], [place] [id number] [place] Farmer MD ZD:[id number]   Gastrointestinal Panel by PCR , Stool     Status: None   Collection Time: 05/16/22 12:10 PM   Specimen: Stool  Result Value Ref Range Status   Campylobacter species NOT DETECTED NOT DETECTED Final   Plesimonas shigelloides NOT DETECTED NOT DETECTED Final   Salmonella species NOT DETECTED NOT DETECTED Final   Yersinia enterocolitica NOT DETECTED NOT DETECTED Final   Vibrio species NOT DETECTED NOT DETECTED Final   Vibrio cholerae NOT DETECTED NOT DETECTED Final   Enteroaggregative E coli (EAEC) NOT DETECTED NOT DETECTED Final   Enteropathogenic E coli (EPEC) NOT DETECTED NOT DETECTED Final   Enterotoxigenic E coli (ETEC) NOT DETECTED NOT DETECTED Final   Shiga like toxin producing E coli (STEC) NOT DETECTED NOT DETECTED Final   Shigella/Enteroinvasive E coli (EIEC) NOT DETECTED NOT DETECTED Final   Cryptosporidium NOT DETECTED NOT DETECTED Final   Cyclospora cayetanensis NOT DETECTED NOT DETECTED Final   Entamoeba histolytica NOT DETECTED NOT DETECTED Final   Giardia lamblia NOT DETECTED NOT DETECTED Final   Adenovirus F40/41 NOT DETECTED NOT DETECTED Final   Astrovirus NOT DETECTED NOT DETECTED Final   Norovirus GI/GII NOT DETECTED NOT DETECTED Final   Rotavirus A NOT DETECTED NOT DETECTED Final   Sapovirus (I, II, IV, and V) NOT DETECTED NOT DETECTED Final    Comment: Performed at [facility], [address] [place], [place] [address]  STOOL CULTURE REFLEX - RSASHR     Status: None   Collection Time: 05/16/22 12:10 PM  Result Value Ref Range Status   Stool Culture result 1 (RSASHR) Comment  Final    Comment: (NOTE) No Salmonella or Shigella recovered. Performed At: [facility] [place], [place] [id number] Rush Farmer MD JJ:[id number]      Radiology Studies: DG Chest Port 1 View  Result Date: 05/18/2022 CLINICAL DATA:  Central line placement. EXAM: PORTABLE CHEST 1 VIEW COMPARISON:  CT chest and chest x-ray from  2 days ago. FINDINGS: New right internal jugular central venous catheter with tip at the cavoatrial junction. The heart size and mediastinal contours are within normal limits. Mild diffuse interstitial thickening similar. Decreasing small right and unchanged small left pleural effusions. Increasing asymmetric density in the peripheral left upper lobe. Similar atelectasis in the peripheral right middle lobe. Improving right lower lobe atelectasis. No pneumothorax. No acute osseous abnormality. IMPRESSION: 1. New right internal jugular central venous catheter with tip at the cavoatrial junction. No pneumothorax. 2. Similar diffuse interstitial thickening with increasing asymmetric density in the peripheral left upper lobe. Differential considerations include infection or pulmonary edema. 3. Decreased small right and unchanged small left pleural effusions. Electronically Signed   By: Titus Dubin M.D.   On: 05/18/2022 14:06   US RENAL  Result Date: 05/18/2022 CLINICAL DATA:  Chronic kidney disease EXAM: RENAL / URINARY TRACT ULTRASOUND COMPLETE COMPARISON:  Abdomen CT report 01/13/2018 FINDINGS: Right Kidney: Renal measurements: 8.9 x 4.7 x 4.4 = volume: 97 mL. Kidney appears echogenic. No collecting system dilatation. There is a simple cyst along the midportion of the kidney which is anechoic with through transmission measuring 17 mm. Left Kidney: Renal measurements: 7.5 x 3.9 x 3.2 = volume: 49 mL. Small echogenic appearing kidney. No collecting system dilatation. Anechoic rounded structure seen in the mid kidney measuring 2 cm. Through transmission is smoothly marginated. Bladder:  Appears normal for degree of bladder distention. Other: Scattered ascites.  Probable right-sided pleural effusion. IMPRESSION: Echogenic small appearing kidneys. No collecting system dilatation. Bilateral simple appearing renal cysts. Ascites.  Right pleural effusion Electronically Signed   By: Jill Side M.D.   On: 05/18/2022  11:07    Scheduled Meds:  amLODipine  10 mg Oral Daily   Chlorhexidine Gluconate Cloth  6 each Topical Daily   Chlorhexidine Gluconate Cloth  6 each Topical Q0600   darbepoetin (ARANESP) injection - DIALYSIS  150 mcg Subcutaneous Q Tue-1800   dextromethorphan-guaiFENesin  1 tablet Oral BID   feeding supplement  237 mL Oral BID BM   furosemide  80 mg Intravenous Q12H   [START ON 05/20/2022] heparin  5,000 Units Subcutaneous Q8H   [START ON 05/19/2022] hydrALAZINE  50 mg Oral TID   pantoprazole  40 mg Oral Daily   sodium bicarbonate  1,300 mg Oral BID   Continuous Infusions:  ceFEPime (MAXIPIME) IV 1 g (05/18/22 0944)   methylPREDNISolone (SOLU-MEDROL) injection 1,000 mg (05/18/22 1226)    LOS: 2 days   Roxan Hockey M.D on 05/18/2022 at 6:45 PM  Go to [email or web address] - for contact info  [facility] - Office  [phone]  If 7PM-7AM, please contact night-coverage [email or web address] 05/18/2022, 6:45 PM

## 2022-05-18 NOTE — Progress Notes (Signed)
    Pt is scheduled for random renal biopsy in IR 1/17.  See orders.  To be at [facility] by 830 am via ambulance She will return to [facility] after procedure.  BP 177/43 Will need under 142 systolic--- Dr Moshe Cipro aware  Orders in place Spoke to RN Alroy Dust

## 2022-05-18 NOTE — Procedures (Signed)
Procedure Note  05/18/2022   Preoperative Diagnosis: Chronic Kidney Disease requiring hemodialysis   Postoperative Diagnosis: Same   Procedure(s) Performed: Right IJ Hemodialysis catheter placement using ultrasound guidance   Surgeon: Graciella Freer, DO   Assistants: None   Anesthesia: 1% lidocaine    Complications: None    Indications: Sheri Cooper is a 72 y.o. who was admitted to the hospital for pneumonia and found to have worsening chronic kidney disease, requiring hemodialysis. I discussed the risk and benefits of placement of the right internal jugular hemodialysis catheter placement using ultrasound guidance, including but not limited to bleeding, infection, and risk of pneumothorax.  She has given verbal consent for the procedure.    Procedure: The patient placed supine. The right chest and neck was prepped and draped in the usual sterile fashion.  Wearing full gown and gloves, I performed the procedure.  Timeout was performed prior to the beginning of the procedure.  One percent lidocaine was used for local anesthesia. An ultrasound was utilized to assess the jugular vein.  The needle with syringe was advanced into the jugular vein with dark venous return, and a wire was placed using the Seldinger technique without difficulty.  Ectopia was not noted.  The skin was knicked and a dilator was placed, and the three lumen hemodialysis catheter was placed over the wire with continued control of the wire.  There was good draw back of blood from all three lumens and each flushed easily with saline.  The catheter was secured in 2 points with 3-0 nylon and a biopatch and dressing was placed.  The line was then heparin locked with 1000 units/mL of heparin (1.3 cc in each line).  The patient tolerated the procedure well, and the CXR was ordered to confirm position of the central line.   Graciella Freer, DO [facility] [address] [place], [place]  [id number] [phone] (office)

## 2022-05-18 NOTE — Progress Notes (Addendum)
Hypoglycemic Event  CBG: 64   Treatment: 4 oz juice/soda  Symptoms: None  Follow-up CBG: [id number] CBG Result:66   Possible Reasons for Event: Inadequate meal intake  Comments/MD notified:Dr. Adefeso notified  Given another 4 oz of soda Rechecked CBG after 15 minutes= 83  Sheri Cooper

## 2022-05-18 NOTE — Consult Note (Addendum)
[facility] Renal Consultation Note  Requesting MD: Emokpae Indication for Consultation: advanced ckd-  may need to start dialysis   HPI:  Sheri Cooper is a 72 y.o. female with past medical history significant for HTN, COPD, RA on humira-  had developed rapidly progressive AKI from April of 23 to November of 23-  saw Dr. Theador Hawthorne in December of 23-  had high level proteinuria-   work up was started but I do not have access to any of those results.  Then pt was admitted to [facility] from 1/5-1/10 wit COPD exacerbation/PNA-  her kidney function worsened.  She then presented to [facility] on 11/14 with continued SOB-  cxr with pleural effusion and again PNA.  Urine with hematuria and proteinuria-  no imaging done that I can see.  BP has been high -  UOP last 24 hours 390  Creatinine, Ser  Date/Time Value Ref Range Status  05/18/2022 03:00 AM 8.12 (H) 0.44 - 1.00 mg/dL Final  05/17/2022 04:02 AM 7.51 (H) 0.44 - 1.00 mg/dL Final  05/16/2022 03:09 AM 7.50 (H) 0.44 - 1.00 mg/dL Final  05/10/22 6.95 05/08/22 6.77 11/23 3.7 01/2022 1.8 08/2021 1.06 11/21 0.95   PMHx:   Past Medical History:  Diagnosis Date   Chronic kidney disease (CKD), stage IV (severe) (HCC)    COPD (chronic obstructive pulmonary disease) (HCC)    Rheumatoid arthritis (HCC)       Family Hx: No family history on file.  Social History:  reports that she has quit smoking. Her smoking use included cigarettes. She does not have any smokeless tobacco history on file. She reports that she does not use drugs. No history on file for alcohol use.  Allergies: No Known Allergies  Medications: Prior to Admission medications   Medication Sig Start Date End Date Taking? Authorizing Provider  ABRYSVO 120 MCG/0.5ML injection Inject 0.5 mLs into the muscle once. 01/18/22  Yes [provider]  amLODipine (NORVASC) 10 MG tablet Take 10 mg by mouth daily.   Yes [provider]  carvedilol (COREG)  25 MG tablet Take 25 mg by mouth 2 (two) times daily with a meal. 04/17/22  Yes [provider]  hydrALAZINE (APRESOLINE) 100 MG tablet Take 100 mg by mouth 2 (two) times daily. 04/17/22  Yes [provider]  hydrochlorothiazide (HYDRODIURIL) 25 MG tablet Take 25 mg by mouth daily. 01/06/22  Yes [provider]  rosuvastatin (CRESTOR) 20 MG tablet Take 20 mg by mouth daily.   Yes [provider]  sertraline (ZOLOFT) 50 MG tablet Take 1 tablet by mouth daily. 05/12/22 06/11/22 Yes [provider]  Adalimumab 20 MG/0.4ML PSKT Inject into the skin.    [provider]  metoprolol succinate (TOPROL-XL) 50 MG 24 hr tablet Take 50 mg by mouth daily. Patient not taking: Reported on 05/17/2022 03/31/22   [provider]    I have reviewed the patient's current medications.  Labs:  Results for orders placed or performed during the hospital encounter of 05/16/22 (from the past 48 hour(s))  MRSA Next Gen by PCR, Nasal     Status: None   Collection Time: 05/16/22 12:10 PM   Specimen: Nasal Mucosa; Nasal Swab  Result Value Ref Range   MRSA by PCR Next Gen NOT DETECTED NOT DETECTED    Comment: (NOTE) The GeneXpert MRSA Assay (FDA approved for NASAL specimens only), is one component of a comprehensive MRSA colonization surveillance program. It is not intended to diagnose  MRSA infection nor to guide or monitor treatment for MRSA infections. Test performance is not FDA approved in patients less than 50 years old. Performed at [facility], [address], [place], [place] [address]   C Difficile Quick Screen w PCR reflex     Status: None   Collection Time: 05/16/22 12:10 PM   Specimen: STOOL  Result Value Ref Range   C Diff antigen NEGATIVE NEGATIVE   C Diff toxin NEGATIVE NEGATIVE   C Diff interpretation No C. difficile detected.     Comment: Performed at [facility], [address], [place], [place] [address]  Gastrointestinal Panel by PCR ,  Stool     Status: None   Collection Time: 05/16/22 12:10 PM   Specimen: Stool  Result Value Ref Range   Campylobacter species NOT DETECTED NOT DETECTED   Plesimonas shigelloides NOT DETECTED NOT DETECTED   Salmonella species NOT DETECTED NOT DETECTED   Yersinia enterocolitica NOT DETECTED NOT DETECTED   Vibrio species NOT DETECTED NOT DETECTED   Vibrio cholerae NOT DETECTED NOT DETECTED   Enteroaggregative E coli (EAEC) NOT DETECTED NOT DETECTED   Enteropathogenic E coli (EPEC) NOT DETECTED NOT DETECTED   Enterotoxigenic E coli (ETEC) NOT DETECTED NOT DETECTED   Shiga like toxin producing E coli (STEC) NOT DETECTED NOT DETECTED   Shigella/Enteroinvasive E coli (EIEC) NOT DETECTED NOT DETECTED   Cryptosporidium NOT DETECTED NOT DETECTED   Cyclospora cayetanensis NOT DETECTED NOT DETECTED   Entamoeba histolytica NOT DETECTED NOT DETECTED   Giardia lamblia NOT DETECTED NOT DETECTED   Adenovirus F40/41 NOT DETECTED NOT DETECTED   Astrovirus NOT DETECTED NOT DETECTED   Norovirus GI/GII NOT DETECTED NOT DETECTED   Rotavirus A NOT DETECTED NOT DETECTED   Sapovirus (I, II, IV, and V) NOT DETECTED NOT DETECTED    Comment: Performed at [facility], [address] [place], [place] [address]  Comprehensive metabolic panel     Status: Abnormal   Collection Time: 05/17/22  4:02 AM  Result Value Ref Range   Sodium 141 135 - 145 mmol/L   Potassium 4.2 3.5 - 5.1 mmol/L   Chloride 112 (H) 98 - 111 mmol/L   CO2 9 (L) 22 - 32 mmol/L   Glucose, Bld 62 (L) 70 - 99 mg/dL    Comment: Glucose reference range applies only to samples taken after fasting for at least 8 hours.   BUN 108 (H) 8 - 23 mg/dL    Comment: RESULTS CONFIRMED BY MANUAL DILUTION   Creatinine, Ser 7.51 (H) 0.44 - 1.00 mg/dL   Calcium 7.2 (L) 8.9 - 10.3 mg/dL   Total Protein 5.8 (L) 6.5 - 8.1 g/dL   Albumin 2.6 (L) 3.5 - 5.0 g/dL   AST 38 15 - 41 U/L   ALT 38 0 - 44 U/L   Alkaline Phosphatase 260 (H) 38 - 126 U/L    Total Bilirubin 0.8 0.3 - 1.2 mg/dL   GFR, Estimated 5 (L) >60 mL/min    Comment: (NOTE) Calculated using the CKD-EPI Creatinine Equation (2021)    Anion gap 20 (H) 5 - 15    Comment: Performed at [facility], [address], [place], [place] [address]  CBC     Status: Abnormal   Collection Time: 05/17/22  7:57 AM  Result Value Ref Range   WBC 19.8 (H) 4.0 - 10.5 K/uL   RBC 3.44 (L) 3.87 - 5.11 MIL/uL   Hemoglobin 9.1 (L) 12.0 - 15.0 g/dL   HCT  28.2 (L) 36.0 - 46.0 %   MCV 82.0 80.0 - 100.0 fL   MCH 26.5 26.0 - 34.0 pg   MCHC 32.3 30.0 - 36.0 g/dL   RDW 18.7 (H) 11.5 - 15.5 %   Platelets 200 150 - 400 K/uL   nRBC 0.1 0.0 - 0.2 %    Comment: Performed at [facility], [address], [place], [place] [address]  Urinalysis, Routine w reflex microscopic Urine, Clean Catch     Status: Abnormal   Collection Time: 05/17/22 11:40 PM  Result Value Ref Range   Color, Urine YELLOW YELLOW   APPearance CLOUDY (A) CLEAR   Specific Gravity, Urine 1.014 1.005 - 1.030   pH 5.0 5.0 - 8.0   Glucose, UA NEGATIVE NEGATIVE mg/dL   Hgb urine dipstick SMALL (A) NEGATIVE   Bilirubin Urine NEGATIVE NEGATIVE   Ketones, ur 5 (A) NEGATIVE mg/dL   Protein, ur >=300 (A) NEGATIVE mg/dL   Nitrite NEGATIVE NEGATIVE   Leukocytes,Ua LARGE (A) NEGATIVE   RBC / HPF >50 (H) 0 - 5 RBC/hpf   WBC, UA >50 (H) 0 - 5 WBC/hpf   Bacteria, UA RARE (A) NONE SEEN   Squamous Epithelial / HPF 21-50 0 - 5 /HPF   Trans Epithel, UA 2    WBC Clumps PRESENT    Budding Yeast PRESENT     Comment: Performed at [facility], [address], [place], [place] [address]  Renal function panel     Status: Abnormal   Collection Time: 05/18/22  3:00 AM  Result Value Ref Range   Sodium 142 135 - 145 mmol/L   Potassium 3.9 3.5 - 5.1 mmol/L   Chloride 112 (H) 98 - 111 mmol/L   CO2 8 (L) 22 - 32 mmol/L   Glucose, Bld 57 (L) 70 - 99 mg/dL    Comment: Glucose reference range applies only to samples taken after fasting for at least 8 hours.    BUN 118 (H) 8 - 23 mg/dL    Comment: RESULTS CONFIRMED BY MANUAL DILUTION   Creatinine, Ser 8.12 (H) 0.44 - 1.00 mg/dL   Calcium 7.2 (L) 8.9 - 10.3 mg/dL   Phosphorus 12.8 (H) 2.5 - 4.6 mg/dL    Comment: RESULTS CONFIRMED BY MANUAL DILUTION   Albumin 2.4 (L) 3.5 - 5.0 g/dL   GFR, Estimated 5 (L) >60 mL/min    Comment: (NOTE) Calculated using the CKD-EPI Creatinine Equation (2021)    Anion gap 22 (H) 5 - 15    Comment: Electrolytes repeated to confirm. Performed at [facility], [address], [place], [place] [address]   CBC     Status: Abnormal   Collection Time: 05/18/22  3:00 AM  Result Value Ref Range   WBC 20.2 (H) 4.0 - 10.5 K/uL   RBC 3.22 (L) 3.87 - 5.11 MIL/uL   Hemoglobin 8.5 (L) 12.0 - 15.0 g/dL   HCT 26.2 (L) 36.0 - 46.0 %   MCV 81.4 80.0 - 100.0 fL   MCH 26.4 26.0 - 34.0 pg   MCHC 32.4 30.0 - 36.0 g/dL   RDW 18.8 (H) 11.5 - 15.5 %   Platelets 157 150 - 400 K/uL   nRBC 0.0 0.0 - 0.2 %    Comment: Performed at [facility], [address] [place], [place] [address]  Glucose, capillary     Status: Abnormal   Collection Time: 05/18/22  6:04 AM  Result Value Ref Range   Glucose-Capillary 64 (L) 70 - 99 mg/dL  Comment: Glucose reference range applies only to samples taken after fasting for at least 8 hours.  Glucose, capillary     Status: Abnormal   Collection Time: 05/18/22  6:21 AM  Result Value Ref Range   Glucose-Capillary 66 (L) 70 - 99 mg/dL    Comment: Glucose reference range applies only to samples taken after fasting for at least 8 hours.  Glucose, capillary     Status: None   Collection Time: 05/18/22  6:49 AM  Result Value Ref Range   Glucose-Capillary 83 70 - 99 mg/dL    Comment: Glucose reference range applies only to samples taken after fasting for at least 8 hours.     ROS:  Review of systems not obtained due to patient factors. She is very pretty somnolent   Physical Exam: Vitals:   05/18/22 0500 05/18/22 0600  BP: (!) 162/42 (!)  161/31  Pulse: 79 78  Resp: (!) 9 (!) 9  Temp: 97.6 F (36.4 C)   SpO2: 96% 97%     General: small in stature-  somnolent but did perk up later HEENT: PERRLA, EOMI, mucous membranes moist Neck: positive for JVD Heart: RRR Lungs: dec BS at bases Abdomen: soft , non tender Extremities: pitting edema  Skin: warm and dry Neuro: globally slow-  probably due to metabolic derangements  Assessment/Plan: 72 year old female with COPD and RA -  has had a rapid decline of kidney function now 2 months ago-  difficult to tell what work up has been done-  has proteinuria and hematuria-  has had pulmonary issues that have kept her in the hospital the last 11 days 1.Renal- normal kidney function in April of 23-  very abnormal in November of 23 and has just progressively worsened.  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 Theador Hawthorne so dont need to re create the wheel ( I did get to talk to him -  he never had a chance to order anything ) Unfortunately given her scenario today we may be too late to the game to do anything.  Will order the serologies and start her on steroids and look to get a kidney biopsy BUT also likely need to support her with dialysis at this time-  not sure how much patient understands but did understand we would be starting her on dialysis -  she says her brother Trilby Drummer helps her to make decisions but I dont see his number in the chart.  Addendum-  I was able to talk with Trilby Drummer-  told him about dialysis and biopsy as well as risks and what information we hope to obtain from these maneuvers  2. Hypertension/volume  - hypertensive and volume overloaded -  will challenge with lasix until can get dialysis  3. Metabolic acidosis-  due to her AKI/CKD-  on oral bicarb but quite severe, will need dialysis to support 4. Anemia  - not surprising given hosp and scenario-  will give  ESA   Louis Meckel 05/18/2022, 8:44 AM

## 2022-05-18 NOTE — TOC Initial Note (Signed)
Transition of Care [facility]) - Initial/Assessment Note    Patient Details  Name: Sheri Cooper MRN: [id number] Date of Birth: 13-Oct-1950  [facility]) CM/SW Contact:    Boneta Lucks, RN Phone Number: 05/18/2022, 11:54 AM  Clinical Narrative:   Patient getting temporary HD access today. Patient lives [place] and has been seen by Dr Theador Hawthorne. CM spoke with her brother. He states she has a car and drives. He understands she may need help with transportation. His wife has dementia, so he really want her to stay in [facility] for Dialysis. TOC starting the CLIP paper work for [employer] in [place].  Levada Dy dialysis RN updated.                 Expected Discharge Plan: Home/Self Care Barriers to Discharge: Continued Medical Work up   Patient Goals and CMS Choice Patient states their goals for this hospitalization and ongoing recovery are:: to get better CMS Medicare.gov Compare Post Acute Care list provided to:: Patient Represenative (must comment) Choice offered to / list presented to : Sibling     Expected Discharge Plan and Services     Post Acute Care Choice: Dialysis Living arrangements for the past 2 months: Apartment                    Prior Living Arrangements/Services Living arrangements for the past 2 months: Apartment Lives with:: Self Patient language and need for interpreter reviewed:: Yes        Need for Family Participation in Patient Care: Yes (Comment) Care giver support system in place?: Yes (comment)   Criminal Activity/Legal Involvement Pertinent to Current Situation/Hospitalization: No - Comment as needed  Activities of Daily Living Home Assistive Devices/Equipment: None ADL Screening (condition at time of admission) Patient's cognitive ability adequate to safely complete daily activities?: Yes Is the patient deaf or have difficulty hearing?: No Does the patient have difficulty seeing, even when wearing glasses/contacts?: No Does the patient have  difficulty concentrating, remembering, or making decisions?: No Patient able to express need for assistance with ADLs?: Yes Does the patient have difficulty dressing or bathing?: No Independently performs ADLs?: Yes (appropriate for developmental age) Does the patient have difficulty walking or climbing stairs?: No Weakness of Legs: None Weakness of Arms/Hands: None   Emotional Assessment       Orientation: : Oriented to Self, Oriented to Place, Oriented to Situation Alcohol / Substance Use: Not Applicable Psych Involvement: No (comment)  Admission diagnosis:  HCAP (healthcare-associated pneumonia) [J18.9] Pneumonia of right lower lobe due to infectious organism [J18.9] Patient Active Problem List   Diagnosis Date Noted   HCAP (healthcare-associated pneumonia) 05/16/2022   Hypothermia 05/16/2022   Acute kidney injury superimposed on chronic kidney disease ([place]) 05/16/2022   Elevated brain natriuretic peptide (BNP) level 05/16/2022   Elevated troponin 05/16/2022   Increased anion gap metabolic acidosis 99/37/1696   Prolonged QT interval 05/16/2022   Hypoalbuminemia due to protein-calorie malnutrition ([place]) 05/16/2022   Iron deficiency anemia 05/16/2022   COPD (chronic obstructive pulmonary disease) ([place]) 05/16/2022   CKD (chronic kidney disease), stage V ([place]) 05/16/2022   PCP:  [facility]:  No Pharmacies Listed   Social Determinants of Health (SDOH) Social History: SDOH Screenings   Tobacco Use: Medium Risk (05/16/2022)   SDOH Interventions:   Readmission Risk Interventions    05/18/2022   11:51 AM  Readmission Risk Prevention Plan  Transportation Screening Complete  PCP or Specialist Appt within 5-7 Days Not Complete  Home Care Screening Complete  Medication Review (RN CM) Complete

## 2022-05-18 NOTE — Procedures (Signed)
   INITIAL HEMODIALYSIS TREATMENT NOTE (HD#1):  Uneventful first ever HD session completed using newly inserted RIJ non-tunneled catheter.  Cath tolerated prescribed flows with stable pressures. Goal met: 500 ml removed without interruption in UF.  All blood was returned.  Hand-off given to Carlena Hurl, RN.   Rockwell Alexandria, RN AP KDU

## 2022-05-18 NOTE — Consult Note (Addendum)
[facility] Consult  Reason for Consult: Hemodialysis catheter insertion Referring Physician: Dr. Denton Brick  Chief Complaint   Shortness of Breath     HPI: Sheri Cooper is a 72 y.o. female who was admitted with shortness of breath and pneumonia.  She has a history of chronic kidney disease that has progressively worsened over the last year.  She has had poor urine output.  Per nephrology, recommendation is for the patient to be started on hemodialysis.  General surgery is consulted for hemodialysis catheter placement.  She denies any history of vascular surgeries.  She denies use of blood thinning medications.  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  Her past medical history significant for chronic kidney disease, COPD, and rheumatoid arthritis.  She feels that her shortness of breath is slightly improved.  Denies fevers and chills.  Past Medical History:  Diagnosis Date   Chronic kidney disease (CKD), stage IV (severe) (HCC)    COPD (chronic obstructive pulmonary disease) (HCC)    Rheumatoid arthritis ([place])     No family history on file.  Social History   Tobacco Use   Smoking status: Former    Types: Cigarettes  Substance Use Topics   Drug use: Never    Medications: I have reviewed the patient's current medications.  No Known Allergies   ROS:  Pertinent items are noted in HPI.  Blood pressure (!) 174/35, pulse 85, temperature 97.7 F (36.5 C), temperature source Oral, resp. rate 12, height '4\' 11"'$  (1.499 m), weight 55.9 kg, SpO2 97 %. Physical Exam Vitals reviewed.  Constitutional:      Appearance: She is well-developed.  HENT:     Head: Normocephalic and atraumatic.  Eyes:     Extraocular Movements: Extraocular movements intact.     Pupils: Pupils are equal, round, and reactive to light.  Cardiovascular:     Rate and Rhythm: Normal rate.  Pulmonary:      Effort: Pulmonary effort is normal.  Abdominal:     Palpations: Abdomen is soft.     Tenderness: There is no abdominal tenderness.  Musculoskeletal:     Cervical back: Normal range of motion.  Lymphadenopathy:     Cervical: No cervical adenopathy.  Skin:    General: Skin is warm and dry.  Neurological:     General: No focal deficit present.     Mental Status: She is alert and oriented to person, place, and time.  Psychiatric:        Mood and Affect: Mood normal.        Behavior: Behavior normal.     Results: Results for orders placed or performed during the hospital encounter of 05/16/22 (from the past 48 hour(s))  Comprehensive metabolic panel     Status: Abnormal   Collection Time: 05/17/22  4:02 AM  Result Value Ref Range   Sodium 141 135 - 145 mmol/L   Potassium 4.2 3.5 - 5.1 mmol/L   Chloride 112 (H) 98 - 111 mmol/L   CO2 9 (L) 22 - 32 mmol/L   Glucose, Bld 62 (L) 70 - 99 mg/dL    Comment: Glucose reference range applies only to samples taken after fasting for at least 8 hours.   BUN 108 (H) 8 - 23 mg/dL    Comment: RESULTS CONFIRMED BY MANUAL DILUTION   Creatinine, Ser 7.51 (H) 0.44 - 1.00 mg/dL   Calcium 7.2 (L) 8.9 - 10.3  mg/dL   Total Protein 5.8 (L) 6.5 - 8.1 g/dL   Albumin 2.6 (L) 3.5 - 5.0 g/dL   AST 38 15 - 41 U/L   ALT 38 0 - 44 U/L   Alkaline Phosphatase 260 (H) 38 - 126 U/L   Total Bilirubin 0.8 0.3 - 1.2 mg/dL   GFR, Estimated 5 (L) >60 mL/min    Comment: (NOTE) Calculated using the CKD-EPI Creatinine Equation (2021)    Anion gap 20 (H) 5 - 15    Comment: Performed at [facility], [address], [place], [place] [address]  CBC     Status: Abnormal   Collection Time: 05/17/22  7:57 AM  Result Value Ref Range   WBC 19.8 (H) 4.0 - 10.5 K/uL   RBC 3.44 (L) 3.87 - 5.11 MIL/uL   Hemoglobin 9.1 (L) 12.0 - 15.0 g/dL   HCT 28.2 (L) 36.0 - 46.0 %   MCV 82.0 80.0 - 100.0 fL   MCH 26.5 26.0 - 34.0 pg   MCHC 32.3 30.0 - 36.0 g/dL   RDW 18.7 (H) 11.5 - 15.5  %   Platelets 200 150 - 400 K/uL   nRBC 0.1 0.0 - 0.2 %    Comment: Performed at [facility], [address] [place], [place] [address]  Urinalysis, Routine w reflex microscopic Urine, Clean Catch     Status: Abnormal   Collection Time: 05/17/22 11:40 PM  Result Value Ref Range   Color, Urine YELLOW YELLOW   APPearance CLOUDY (A) CLEAR   Specific Gravity, Urine 1.014 1.005 - 1.030   pH 5.0 5.0 - 8.0   Glucose, UA NEGATIVE NEGATIVE mg/dL   Hgb urine dipstick SMALL (A) NEGATIVE   Bilirubin Urine NEGATIVE NEGATIVE   Ketones, ur 5 (A) NEGATIVE mg/dL   Protein, ur >=300 (A) NEGATIVE mg/dL   Nitrite NEGATIVE NEGATIVE   Leukocytes,Ua LARGE (A) NEGATIVE   RBC / HPF >50 (H) 0 - 5 RBC/hpf   WBC, UA >50 (H) 0 - 5 WBC/hpf   Bacteria, UA RARE (A) NONE SEEN   Squamous Epithelial / HPF 21-50 0 - 5 /HPF   Trans Epithel, UA 2    WBC Clumps PRESENT    Budding Yeast PRESENT     Comment: Performed at [facility], [address] [place], [place] [address]  Renal function panel     Status: Abnormal   Collection Time: 05/18/22  3:00 AM  Result Value Ref Range   Sodium 142 135 - 145 mmol/L   Potassium 3.9 3.5 - 5.1 mmol/L   Chloride 112 (H) 98 - 111 mmol/L   CO2 8 (L) 22 - 32 mmol/L   Glucose, Bld 57 (L) 70 - 99 mg/dL    Comment: Glucose reference range applies only to samples taken after fasting for at least 8 hours.   BUN 118 (H) 8 - 23 mg/dL    Comment: RESULTS CONFIRMED BY MANUAL DILUTION   Creatinine, Ser 8.12 (H) 0.44 - 1.00 mg/dL   Calcium 7.2 (L) 8.9 - 10.3 mg/dL   Phosphorus 12.8 (H) 2.5 - 4.6 mg/dL    Comment: RESULTS CONFIRMED BY MANUAL DILUTION   Albumin 2.4 (L) 3.5 - 5.0 g/dL   GFR, Estimated 5 (L) >60 mL/min    Comment: (NOTE) Calculated using the CKD-EPI Creatinine Equation (2021)    Anion gap 22 (H) 5 - 15    Comment: Electrolytes repeated to confirm. Performed at [facility], [address] [place], [place] [address]  CBC     Status: Abnormal   Collection Time:  05/18/22  3:00 AM  Result Value Ref Range   WBC 20.2 (H) 4.0 - 10.5 K/uL   RBC 3.22 (L) 3.87 - 5.11 MIL/uL   Hemoglobin 8.5 (L) 12.0 - 15.0 g/dL   HCT 26.2 (L) 36.0 - 46.0 %   MCV 81.4 80.0 - 100.0 fL   MCH 26.4 26.0 - 34.0 pg   MCHC 32.4 30.0 - 36.0 g/dL   RDW 18.8 (H) 11.5 - 15.5 %   Platelets 157 150 - 400 K/uL   nRBC 0.0 0.0 - 0.2 %    Comment: Performed at [facility], [address], [place], [place] [address]  Glucose, capillary     Status: Abnormal   Collection Time: 05/18/22  6:04 AM  Result Value Ref Range   Glucose-Capillary 64 (L) 70 - 99 mg/dL    Comment: Glucose reference range applies only to samples taken after fasting for at least 8 hours.  Glucose, capillary     Status: Abnormal   Collection Time: 05/18/22  6:21 AM  Result Value Ref Range   Glucose-Capillary 66 (L) 70 - 99 mg/dL    Comment: Glucose reference range applies only to samples taken after fasting for at least 8 hours.  Glucose, capillary     Status: None   Collection Time: 05/18/22  6:49 AM  Result Value Ref Range   Glucose-Capillary 83 70 - 99 mg/dL    Comment: Glucose reference range applies only to samples taken after fasting for at least 8 hours.  Sedimentation rate     Status: Abnormal   Collection Time: 05/18/22  9:59 AM  Result Value Ref Range   Sed Rate 76 (H) 0 - 22 mm/hr    Comment: Performed at [facility], [address], [place], [place] [address]    US RENAL  Result Date: 05/18/2022 CLINICAL DATA:  Chronic kidney disease EXAM: RENAL / URINARY TRACT ULTRASOUND COMPLETE COMPARISON:  Abdomen CT report 01/13/2018 FINDINGS: Right Kidney: Renal measurements: 8.9 x 4.7 x 4.4 = volume: 97 mL. Kidney appears echogenic. No collecting system dilatation. There is a simple cyst along the midportion of the kidney which is anechoic with through transmission measuring 17 mm. Left Kidney: Renal measurements: 7.5 x 3.9 x 3.2 = volume: 49 mL. Small echogenic appearing kidney. No collecting system  dilatation. Anechoic rounded structure seen in the mid kidney measuring 2 cm. Through transmission is smoothly marginated. Bladder: Appears normal for degree of bladder distention. Other: Scattered ascites.  Probable right-sided pleural effusion. IMPRESSION: Echogenic small appearing kidneys. No collecting system dilatation. Bilateral simple appearing renal cysts. Ascites.  Right pleural effusion Electronically Signed   By: Jill Side M.D.   On: 05/18/2022 11:07     Assessment & Plan:  Sheri Cooper is a 72 y.o. female who was admitted with pneumonia and noted to have chronic kidney disease requiring hemodialysis.  General surgery consulted for hemodialysis catheter placement.  Imaging and blood work evaluated by myself.  -The risk and benefits of right IJ hemodialysis catheter placement were discussed including but not limited to bleeding, infection, injury to surrounding structures, pneumothorax, and need for additional procedures.  After careful consideration, Sheri Cooper has decided to proceed with catheter placement.  -Please refer to separate dictation for details of the procedure -Chest x-ray confirms placement of right IJ hemodialysis catheter -Okay for dialysis per nephrology -Care per primary team  All questions were answered to the satisfaction  of the patient.  -- Graciella Freer, DO [facility] [address] [place], [place] [address] [phone] (office)

## 2022-05-19 ENCOUNTER — Ambulatory Visit (HOSPITAL_COMMUNITY)
Admit: 2022-05-19 | Discharge: 2022-05-19 | Disposition: A | Discharge status: Home / Self Care | Payer: Medicare Other | Attending: Nephrology | Admitting: Nephrology

## 2022-05-19 ENCOUNTER — Encounter (HOSPITAL_COMMUNITY): Payer: Self-pay | Admitting: Internal Medicine

## 2022-05-19 DIAGNOSIS — N186 End stage renal disease: Secondary | ICD-10-CM | POA: Insufficient documentation

## 2022-05-19 DIAGNOSIS — N179 Acute kidney failure, unspecified: Secondary | ICD-10-CM | POA: Diagnosis not present

## 2022-05-19 DIAGNOSIS — J189 Pneumonia, unspecified organism: Secondary | ICD-10-CM | POA: Diagnosis not present

## 2022-05-19 DIAGNOSIS — J449 Chronic obstructive pulmonary disease, unspecified: Secondary | ICD-10-CM | POA: Diagnosis not present

## 2022-05-19 DIAGNOSIS — R7989 Other specified abnormal findings of blood chemistry: Secondary | ICD-10-CM | POA: Diagnosis not present

## 2022-05-19 DIAGNOSIS — T68XXXD Hypothermia, subsequent encounter: Secondary | ICD-10-CM

## 2022-05-19 LAB — RENAL FUNCTION PANEL
Albumin: 2.2 g/dL — ABNORMAL LOW (ref 3.5–5.0)
Anion gap: 17 — ABNORMAL HIGH (ref 5–15)
BUN: 85 mg/dL — ABNORMAL HIGH (ref 8–23)
CO2: 17 mmol/L — ABNORMAL LOW (ref 22–32)
Calcium: 7 mg/dL — ABNORMAL LOW (ref 8.9–10.3)
Chloride: 105 mmol/L (ref 98–111)
Creatinine, Ser: 5.57 mg/dL — ABNORMAL HIGH (ref 0.44–1.00)
GFR, Estimated: 8 mL/min — ABNORMAL LOW (ref >60)
Glucose, Bld: 100 mg/dL — ABNORMAL HIGH (ref 70–99)
Phosphorus: 8.4 mg/dL — ABNORMAL HIGH (ref 2.5–4.6)
Potassium: 3.4 mmol/L — ABNORMAL LOW (ref 3.5–5.1)
Sodium: 139 mmol/L (ref 135–145)

## 2022-05-19 LAB — ANA: Anti Nuclear Antibody (ANA): NEGATIVE

## 2022-05-19 LAB — CBC
HCT: 21.3 % — ABNORMAL LOW (ref 36.0–46.0)
Hemoglobin: 7.1 g/dL — ABNORMAL LOW (ref 12.0–15.0)
MCH: 26 pg (ref 26.0–34.0)
MCHC: 33.3 g/dL (ref 30.0–36.0)
MCV: 78 fL — ABNORMAL LOW (ref 80.0–100.0)
Platelets: 135 10*3/uL — ABNORMAL LOW (ref 150–400)
RBC: 2.73 MIL/uL — ABNORMAL LOW (ref 3.87–5.11)
RDW: 17.9 % — ABNORMAL HIGH (ref 11.5–15.5)
WBC: 8.4 10*3/uL (ref 4.0–10.5)
nRBC: 0 % (ref 0.0–0.2)

## 2022-05-19 LAB — ANCA PROFILE
Anti-MPO Antibodies: <0.2 U (ref 0.0–0.9)
Anti-PR3 Antibodies: <0.2 U (ref 0.0–0.9)
Atypical P-ANCA titer: <1:20 {titer}
C-ANCA: <1:20 {titer}
P-ANCA: <1:20 {titer}

## 2022-05-19 LAB — C4 COMPLEMENT: Complement C4, Body Fluid: 33 mg/dL (ref 12–38)

## 2022-05-19 LAB — RAPID HIV SCREEN (HIV 1/2 AB+AG)
HIV 1/2 Antibodies: NONREACTIVE
HIV-1 P24 Antigen - HIV24: NONREACTIVE

## 2022-05-19 LAB — GLOMERULAR BASEMENT MEMBRANE ANTIBODIES: GBM Ab: <0.2 U (ref 0.0–0.9)

## 2022-05-19 LAB — C3 COMPLEMENT: C3 Complement: 115 mg/dL (ref 82–167)

## 2022-05-19 LAB — PROTIME-INR
INR: 1.2 (ref 0.8–1.2)
Prothrombin Time: 15.1 seconds (ref 11.4–15.2)

## 2022-05-19 LAB — HEPATITIS C ANTIBODY: HCV Ab: NONREACTIVE

## 2022-05-19 MED ORDER — MIDAZOLAM HCL 2 MG/2ML IJ SOLN
INTRAMUSCULAR | Status: AC | PRN
  Administered 2022-05-19: .5 mg via INTRAVENOUS

## 2022-05-19 MED ORDER — LIDOCAINE HCL (PF) 1 % IJ SOLN
INTRAMUSCULAR | Status: AC
  Filled 2022-05-19: qty 30

## 2022-05-19 MED ORDER — LIDOCAINE HCL (PF) 1 % IJ SOLN
10.0000 mL | Freq: Once | INTRAMUSCULAR | Status: AC
  Administered 2022-05-19: 10 mL via INTRADERMAL

## 2022-05-19 MED ORDER — FENTANYL CITRATE (PF) 100 MCG/2ML IJ SOLN
INTRAMUSCULAR | Status: AC | PRN
  Administered 2022-05-19: 25 ug via INTRAVENOUS

## 2022-05-19 MED ORDER — FENTANYL CITRATE (PF) 100 MCG/2ML IJ SOLN
INTRAMUSCULAR | Status: AC
  Filled 2022-05-19: qty 4

## 2022-05-19 MED ORDER — MIDAZOLAM HCL 2 MG/2ML IJ SOLN
INTRAMUSCULAR | Status: AC
  Filled 2022-05-19: qty 4

## 2022-05-19 MED ORDER — CALCIUM ACETATE (PHOS BINDER) 667 MG PO CAPS
667.0000 mg | ORAL_CAPSULE | Freq: Three times a day (TID) | ORAL | Status: DC
  Administered 2022-05-19 – 2022-05-27 (×16): 667 mg via ORAL
  Filled 2022-05-19 (×19): qty 1

## 2022-05-19 MED ORDER — GELATIN ABSORBABLE 12-7 MM EX MISC
1.0000 | Freq: Once | CUTANEOUS | Status: AC
  Administered 2022-05-19: 1 via TOPICAL

## 2022-05-19 MED ORDER — CLONIDINE HCL 0.1 MG PO TABS
0.1000 mg | ORAL_TABLET | Freq: Once | ORAL | Status: AC
  Administered 2022-05-19: 0.1 mg via ORAL
  Filled 2022-05-19: qty 1

## 2022-05-19 MED ORDER — GELATIN ABSORBABLE 12-7 MM EX MISC
CUTANEOUS | Status: AC
  Filled 2022-05-19: qty 1

## 2022-05-19 NOTE — Progress Notes (Signed)
PROGRESS NOTE     Sheri Cooper, is a 72 y.o. female, DOB - Dec 21, 1950, KLK:[id number]  Admit date - 05/16/2022   Admitting Physician Sheri Hoit, DO  Outpatient Primary MD for the patient is Practice, [facility]  LOS - 3  Chief Complaint  Patient presents with   Shortness of Breath      Brief Summary:-  72 y.o. female with medical history significant of COPD, rheumatoid arthritis, stage V CKD  Admitted on 05/16/2022 with healthcare associated pneumonia and worsening renal function with anion gap metabolic acidosis -Patient was recently discharged from [facility] on 05/12/2022 after treatment for COPD   A/p HCAP--- recent hospitalization at [facility] as outlined above -COVID, flu and RSV negative -Leukocytosis and elevated procalcitonin noted WBC 15.3 >> 19.8 >>20.2 -c/n Vanc /cefepime combo -Bronchodilators and mucolytics as ordered   Social/Ethics--plan of care and advanced directive discussed with patient and patient's brother Sheri Cooper -Patient is a full code   AKI on CKD 5--very poor urine output, significant metabolic acidosis noted, becoming more sleepy -Review of records in [place] shows that her current renal function is close to recent baseline -Anion gap metabolic acidosis noted - patient sees Dr. Theador Cooper as outpatient---discussed with Dr. Lars Cooper is concerned that patient may need hemodialysis this admission -Care Everywhere [facility] reviewed -Right IJ HD catheter placed -Initiated on hemodialysis on 05/18/2022 -completed IR renal biopsy on 05/19/2022 at [facility] -- renally adjust medications, avoid nephrotoxic agents / dehydration  / hypotension   COPD--continue bronchodilators -Hold off on steroids   chronic Anemia----multifactorial suspect partly related to underlying CKD 5 --Ferritin and serum iron are not low -No evidence of ongoing blood loss   Incidental finding of multiple renal lesions CT angiography  of chest showed multiple renal lesions bilaterally, incompletely characterized on today's noncontrast CT examination, including an indeterminate 1.9 cm lesion in the anterior aspect of the interpolar region of the left kidney.  Follow-up nonemergent outpatient abdominal MRI with and without IV gadolinium is recommended in the near future to definitively evaluate this lesion and exclude neoplasm   Elevated BNP--echo from 05/10/2022 at [facility] available in [place] reveals-  The left ventricle is normal in size with normal wall thickness. The left ventricular systolic function is normal, LVEF is visually estimated at 60-65%.  There is normal left ventricular diastolic function.  -There is mild to moderate pulmonary hypertension no significant valvular abnormalities -Use hemodialysis to address volume status   Loose Stools -x 3 days.... Recent hospitalizations at [facility] and recent exposure to antibiotics - stool for C. difficile and GI pathogen--negative -Diarrhea improving -Use Imodium as needed  Procedures:- -Right IJ HD catheter placed on 05/18/22 -Initiated on hemodialysis on 05/18/2022 -completed IR renal biopsy on 05/19/2022 at [facility]  Status is: Inpatient   Disposition: The patient is from: Home              Anticipated d/c is to: Home Vs SNF--patient will need to be clipped and patient will need outpatient hemodialysis chair prior to discharge              Anticipated d/c date is: 2 days              Patient currently is not medically stable to d/c. Barriers: Not Clinically Stable-  Code Status :  -  Code Status: Full Code   Family Communication:    NA (patient is alert, awake and coherent)   DVT Prophylaxis  :   -  SCDs   heparin injection 5,000 Units Start: 05/20/22 0600 SCDs Start: 05/16/22 0644   Lab Results  Component Value Date   PLT 135 (L) 05/19/2022   Inpatient Medications  Scheduled Meds:  amLODipine  10 mg Oral Daily   calcium acetate  667 mg Oral  TID WC   Chlorhexidine Gluconate Cloth  6 each Topical Daily   Chlorhexidine Gluconate Cloth  6 each Topical Q0600   darbepoetin (ARANESP) injection - DIALYSIS  150 mcg Subcutaneous Q Tue-1800   dextromethorphan-guaiFENesin  1 tablet Oral BID   feeding supplement  237 mL Oral BID BM   furosemide  80 mg Intravenous Q12H   [START ON 05/20/2022] heparin  5,000 Units Subcutaneous Q8H   hydrALAZINE  50 mg Oral TID   pantoprazole  40 mg Oral Daily   sodium bicarbonate  1,300 mg Oral BID   Continuous Infusions:  ceFEPime (MAXIPIME) IV 1 g (05/19/22 1434)   methylPREDNISolone (SOLU-MEDROL) injection 1,000 mg (05/19/22 1432)   PRN Meds:.acetaminophen **OR** acetaminophen, alteplase, heparin, ipratropium-albuterol, labetalol, loperamide, oxyCODONE, prochlorperazine   Anti-infectives (From admission, onward)    Start     Dose/Rate Route Frequency Ordered Stop   05/17/22 0800  ceFEPIme (MAXIPIME) 1 g in sodium chloride 0.9 % 100 mL IVPB        1 g 200 mL/hr over 30 Minutes Intravenous Every 24 hours 05/16/22 0641     05/16/22 0641  vancomycin variable dose per unstable renal function (pharmacist dosing)  Status:  Discontinued         Does not apply See admin instructions 05/16/22 0641 05/17/22 1605   05/16/22 0445  vancomycin (VANCOREADY) IVPB 1250 mg/250 mL        1,250 mg 166.7 mL/hr over 90 Minutes Intravenous  Once 05/16/22 0444 05/16/22 0732   05/16/22 0430  ceFEPIme (MAXIPIME) 2 g in sodium chloride 0.9 % 100 mL IVPB        2 g 200 mL/hr over 30 Minutes Intravenous  Once 05/16/22 0426 05/16/22 0534         Subjective: Sheri Cooper seen prior to renal biopsy, no specific complaints today; BPs are elevated;   Objective: Vitals:   05/19/22 0500 05/19/22 0501 05/19/22 0747 05/19/22 0837  BP:  (!) 165/56  (!) 177/39  Pulse:   66   Resp:   10   Temp:   (!) 97.2 F (36.2 C)   TempSrc:   Oral   SpO2:   96%   Weight: 53 kg     Height:        Intake/Output Summary (Last 24  hours) at 05/19/2022 1501 Last data filed at 05/19/2022 0336 Gross per 24 hour  Intake 366 ml  Output 500 ml  Net -134 ml   Filed Weights   05/18/22 1800 05/18/22 2015 05/19/22 0500  Weight: 56.2 kg 55.7 kg 53 kg    Physical Exam  Gen:-Somewhat sleepy, but easily arousable, in no apparent distress  HEENT:- [place].AT, No sclera icterus Neck-Supple Neck, right IJ hemodialysis catheter Lungs-slightly diminished in bases, no wheezing CV- S1, S2 normal, regular  Abd-  +ve B.Sounds, Abd Soft, No tenderness,    Extremity/Skin:-  pitting edema,  pedal pulses present  Psych-affect is appropriate, oriented x3, somewhat sleepy Neuro-generalized weakness no new focal deficits, no tremors  Data Reviewed: I have personally reviewed following labs and imaging studies  CBC: Recent Labs  Lab 05/16/22 0309 05/17/22 0757 05/18/22 0300 05/19/22 0433  WBC 15.3* 19.8* 20.2* 8.4  NEUTROABS  12.6*  --   --   --   HGB 10.1* 9.1* 8.5* 7.1*  HCT 30.6* 28.2* 26.2* 21.3*  MCV 79.1* 82.0 81.4 78.0*  PLT 254 200 157 517*   Basic Metabolic Panel: Recent Labs  Lab 05/16/22 0309 05/16/22 0711 05/17/22 0402 05/18/22 0300 05/19/22 0433  NA 141  --  141 142 139  K 3.8  --  4.2 3.9 3.4*  CL 114*  --  112* 112* 105  CO2 11*  --  9* 8* 17*  GLUCOSE 86  --  62* 57* 100*  BUN 75*  --  108* 118* 85*  CREATININE 7.50*  --  7.51* 8.12* 5.57*  CALCIUM 7.4*  --  7.2* 7.2* 7.0*  MG  --  2.6*  --   --   --   PHOS  --  11.9*  --  12.8* 8.4*   GFR: Estimated Creatinine Clearance: 6.8 mL/min (A) (by C-G formula based on SCr of 5.57 mg/dL (H)). Liver Function Tests: Recent Labs  Lab 05/16/22 0309 05/17/22 0402 05/18/22 0300 05/19/22 0433  AST 23 38  --   --   ALT 28 38  --   --   ALKPHOS 163* 260*  --   --   BILITOT 0.9 0.8  --   --   PROT 6.4* 5.8*  --   --   ALBUMIN 2.8* 2.6* 2.4* 2.2*   Recent Results (from the past 240 hour(s))  Resp panel by RT-PCR (RSV, Flu A&B, Covid) Anterior Nasal Swab      Status: None   Collection Time: 05/16/22  3:33 AM   Specimen: Anterior Nasal Swab  Result Value Ref Range Status   SARS Coronavirus 2 by RT PCR NEGATIVE NEGATIVE Final    Comment: (NOTE) SARS-CoV-2 target nucleic acids are NOT DETECTED.  The SARS-CoV-2 RNA is generally detectable in upper respiratory specimens during the acute phase of infection. The lowest concentration of SARS-CoV-2 viral copies this assay can detect is 138 copies/mL. A negative result does not preclude SARS-Cov-2 infection and should not be used as the sole basis for treatment or other patient management decisions. A negative result may occur with  improper specimen collection/handling, submission of specimen other than nasopharyngeal swab, presence of viral mutation(s) within the areas targeted by this assay, and inadequate number of viral copies(<138 copies/mL). A negative result must be combined with clinical observations, patient history, and epidemiological information. The expected result is Negative.  Fact Sheet for Patients:  EntrepreneurPulse.com.au  Fact Sheet for Healthcare Providers:  IncredibleEmployment.be  This test is no t yet approved or cleared by the [place] FDA and  has been authorized for detection and/or diagnosis of SARS-CoV-2 by FDA under an Emergency Use Authorization (EUA). This EUA will remain  in effect (meaning this test can be used) for the duration of the COVID-19 declaration under Section 564(b)(1) of the Act, 21 U.S.C.section 360bbb-3(b)(1), unless the authorization is terminated  or revoked sooner.       Influenza A by PCR NEGATIVE NEGATIVE Final   Influenza B by PCR NEGATIVE NEGATIVE Final    Comment: (NOTE) The Xpert Xpress SARS-CoV-2/FLU/RSV plus assay is intended as an aid in the diagnosis of influenza from Nasopharyngeal swab specimens and should not be used as a sole basis for treatment. Nasal washings and aspirates are  unacceptable for Xpert Xpress SARS-CoV-2/FLU/RSV testing.  Fact Sheet for Patients: EntrepreneurPulse.com.au  Fact Sheet for Healthcare Providers: IncredibleEmployment.be  This test is not yet approved or cleared  by the [place] and has been authorized for detection and/or diagnosis of SARS-CoV-2 by FDA under an Emergency Use Authorization (EUA). This EUA will remain in effect (meaning this test can be used) for the duration of the COVID-19 declaration under Section 564(b)(1) of the Act, 21 U.S.C. section 360bbb-3(b)(1), unless the authorization is terminated or revoked.     Resp Syncytial Virus by PCR NEGATIVE NEGATIVE Final    Comment: (NOTE) Fact Sheet for Patients: EntrepreneurPulse.com.au  Fact Sheet for Healthcare Providers: IncredibleEmployment.be  This test is not yet approved or cleared by the [place] FDA and has been authorized for detection and/or diagnosis of SARS-CoV-2 by FDA under an Emergency Use Authorization (EUA). This EUA will remain in effect (meaning this test can be used) for the duration of the COVID-19 declaration under Section 564(b)(1) of the Act, 21 U.S.C. section 360bbb-3(b)(1), unless the authorization is terminated or revoked.  Performed at [facility], [address], [place], [place] [address]   Culture, blood (routine x 2) Call MD if unable to obtain prior to antibiotics being given     Status: None (Preliminary result)   Collection Time: 05/16/22  6:44 AM   Specimen: BLOOD RIGHT FOREARM  Result Value Ref Range Status   Specimen Description   Final    BLOOD RIGHT FOREARM BOTTLES DRAWN AEROBIC AND ANAEROBIC   Special Requests Blood Culture adequate volume  Final   Culture   Final    NO GROWTH 3 DAYS Performed at [facility], [address], [place], [place] [address]    Report Status PENDING  Incomplete  Culture, blood (routine x 2) Call MD if unable to  obtain prior to antibiotics being given     Status: None (Preliminary result)   Collection Time: 05/16/22  7:11 AM   Specimen: BLOOD LEFT ARM  Result Value Ref Range Status   Specimen Description BLOOD LEFT ARM BOTTLES DRAWN AEROBIC AND ANAEROBIC  Final   Special Requests Blood Culture adequate volume  Final   Culture   Final    NO GROWTH 3 DAYS Performed at [facility], [address] [place], [place] [address]    Report Status PENDING  Incomplete  MRSA Next Gen by PCR, Nasal     Status: None   Collection Time: 05/16/22 12:10 PM   Specimen: Nasal Mucosa; Nasal Swab  Result Value Ref Range Status   MRSA by PCR Next Gen NOT DETECTED NOT DETECTED Final    Comment: (NOTE) The GeneXpert MRSA Assay (FDA approved for NASAL specimens only), is one component of a comprehensive MRSA colonization surveillance program. It is not intended to diagnose MRSA infection nor to guide or monitor treatment for MRSA infections. Test performance is not FDA approved in patients less than 63 years old. Performed at [facility], [address], [place], [place] [address]   C Difficile Quick Screen w PCR reflex     Status: None   Collection Time: 05/16/22 12:10 PM   Specimen: STOOL  Result Value Ref Range Status   C Diff antigen NEGATIVE NEGATIVE Final   C Diff toxin NEGATIVE NEGATIVE Final   C Diff interpretation No C. difficile detected.  Final    Comment: Performed at [facility], [address] [place], [place] [address]  Stool culture     Status: None (Preliminary result)   Collection Time: 05/16/22 12:10 PM   Specimen: Stool  Result Value Ref Range Status   Salmonella/Shigella Screen Final report  Final   Campylobacter  Culture PENDING  Incomplete   E coli, Shiga toxin Assay Negative Negative Final    Comment: (NOTE) Performed At: [facility] [place], [place] [id number] Rush Farmer MD [id number]   Gastrointestinal Panel by PCR , Stool     Status: None    Collection Time: 05/16/22 12:10 PM   Specimen: Stool  Result Value Ref Range Status   Campylobacter species NOT DETECTED NOT DETECTED Final   Plesimonas shigelloides NOT DETECTED NOT DETECTED Final   Salmonella species NOT DETECTED NOT DETECTED Final   Yersinia enterocolitica NOT DETECTED NOT DETECTED Final   Vibrio species NOT DETECTED NOT DETECTED Final   Vibrio cholerae NOT DETECTED NOT DETECTED Final   Enteroaggregative E coli (EAEC) NOT DETECTED NOT DETECTED Final   Enteropathogenic E coli (EPEC) NOT DETECTED NOT DETECTED Final   Enterotoxigenic E coli (ETEC) NOT DETECTED NOT DETECTED Final   Shiga like toxin producing E coli (STEC) NOT DETECTED NOT DETECTED Final   Shigella/Enteroinvasive E coli (EIEC) NOT DETECTED NOT DETECTED Final   Cryptosporidium NOT DETECTED NOT DETECTED Final   Cyclospora cayetanensis NOT DETECTED NOT DETECTED Final   Entamoeba histolytica NOT DETECTED NOT DETECTED Final   Giardia lamblia NOT DETECTED NOT DETECTED Final   Adenovirus F40/41 NOT DETECTED NOT DETECTED Final   Astrovirus NOT DETECTED NOT DETECTED Final   Norovirus GI/GII NOT DETECTED NOT DETECTED Final   Rotavirus A NOT DETECTED NOT DETECTED Final   Sapovirus (I, II, IV, and V) NOT DETECTED NOT DETECTED Final    Comment: Performed at [facility], [address] [place], [place] [address]  STOOL CULTURE REFLEX - RSASHR     Status: None   Collection Time: 05/16/22 12:10 PM  Result Value Ref Range Status   Stool Culture result 1 (RSASHR) Comment  Final    Comment: (NOTE) No Salmonella or Shigella recovered. Performed At: [facility] [place], [place] [id number] Rush Farmer MD HO:[id number]      Radiology Studies: US BIOPSY (KIDNEY)  Result Date: 05/19/2022 INDICATION: 72 year old female with history of end-stage renal disease. EXAM: ULTRASOUND GUIDED RENAL BIOPSY COMPARISON:  None Available. MEDICATIONS: None. ANESTHESIA/SEDATION: Fentanyl 25 mcg IV;  Versed 0.5 mg IV Total Moderate Sedation time: 10 minutes; The patient was continuously monitored during the procedure by the interventional radiology nurse under my direct supervision. COMPLICATIONS: None immediate. PROCEDURE: Informed written consent was obtained from the patient after a discussion of the risks, benefits and alternatives to treatment. The patient understands and consents the procedure. A timeout was performed prior to the initiation of the procedure. Ultrasound scanning was performed of the bilateral flanks. The inferior pole of the left kidney was selected for biopsy due to location and sonographic window. The procedure was planned. The operative site was prepped and draped in the usual sterile fashion. The overlying soft tissues were anesthetized with 1% lidocaine with epinephrine. A 17 gauge coaxial introducer needle was advanced into the inferior cortex of the left kidney and 2 core biopsies were obtained under direct ultrasound guidance with a 16 gauge core biopsy device. Images were saved for documentation purposes. The biopsy device was removed and hemostasis was obtained with manual compression after injection of Gel-Foam slurry along the needle track under ultrasound guidance. Post procedural scanning was negative for significant post procedural hemorrhage or additional complication. A dressing was placed. The patient tolerated the procedure well without immediate post procedural complication. IMPRESSION: Technically successful ultrasound guided left renal biopsy. Ruthann Cancer, MD [facility] Radiology Electronically Signed   By: Ruthann Cancer M.D.   On: 05/19/2022 14:19   DG Chest Port 1 View  Result Date: 05/18/2022 CLINICAL DATA:  Central line placement. EXAM: PORTABLE CHEST 1 VIEW COMPARISON:  CT chest and chest x-ray from 2 days ago. FINDINGS: New right internal jugular central venous catheter with tip at the cavoatrial junction.  The heart size and mediastinal contours are within normal limits. Mild diffuse interstitial thickening similar. Decreasing small right and unchanged small left pleural effusions. Increasing asymmetric density in the peripheral left upper lobe. Similar atelectasis in the peripheral right middle lobe. Improving right lower lobe atelectasis. No pneumothorax. No acute osseous abnormality. IMPRESSION: 1. New right internal jugular central venous catheter with tip at the cavoatrial junction. No pneumothorax. 2. Similar diffuse interstitial thickening with increasing asymmetric density in the peripheral left upper lobe. Differential considerations include infection or pulmonary edema. 3. Decreased small right and unchanged small left pleural effusions. Electronically Signed   By: Titus Dubin M.D.   On: 05/18/2022 14:06   US RENAL  Result Date: 05/18/2022 CLINICAL DATA:  Chronic kidney disease EXAM: RENAL / URINARY TRACT ULTRASOUND COMPLETE COMPARISON:  Abdomen CT report 01/13/2018 FINDINGS: Right Kidney: Renal measurements: 8.9 x 4.7 x 4.4 = volume: 97 mL. Kidney appears echogenic. No collecting system dilatation. There is a simple cyst along the midportion of the kidney which is anechoic with through transmission measuring 17 mm. Left Kidney: Renal measurements: 7.5 x 3.9 x 3.2 = volume: 49 mL. Small echogenic appearing kidney. No collecting system dilatation. Anechoic rounded structure seen in the mid kidney measuring 2 cm. Through transmission is smoothly marginated. Bladder: Appears normal for degree of bladder distention. Other: Scattered ascites.  Probable right-sided pleural effusion. IMPRESSION: Echogenic small appearing kidneys. No collecting system dilatation. Bilateral simple appearing renal cysts. Ascites.  Right pleural effusion Electronically Signed   By: Jill Side M.D.   On: 05/18/2022 11:07    Scheduled Meds:  amLODipine  10 mg Oral Daily   calcium acetate  667 mg Oral TID WC   Chlorhexidine  Gluconate Cloth  6 each Topical Daily   Chlorhexidine Gluconate Cloth  6 each Topical Q0600   darbepoetin (ARANESP) injection - DIALYSIS  150 mcg Subcutaneous Q Tue-1800   dextromethorphan-guaiFENesin  1 tablet Oral BID   feeding supplement  237 mL Oral BID BM   furosemide  80 mg Intravenous Q12H   [START ON 05/20/2022] heparin  5,000 Units Subcutaneous Q8H   hydrALAZINE  50 mg Oral TID   pantoprazole  40 mg Oral Daily   sodium bicarbonate  1,300 mg Oral BID   Continuous Infusions:  ceFEPime (MAXIPIME) IV 1 g (05/19/22 1434)   methylPREDNISolone (SOLU-MEDROL) injection 1,000 mg (05/19/22 1432)    LOS: 3 days   Irwin Brakeman M.D on 05/19/2022 at 3:01 PM  Go to [email or web address] - for contact info  [facility] - Office  [phone]  If 7PM-7AM, please contact night-coverage [email or web address] 05/19/2022, 3:01 PM

## 2022-05-19 NOTE — Progress Notes (Signed)
[facility] Progress Note    Assessment/ Plan:   AKI -worsening since April '23 with hematuria and proteinuria. Renal biopsy planned for today, BP maybe a limiting factor, clonidine 0.'1mg'$  x 1 dose ordered and hopefully with sedation BP will be at goal. If not, will need to revisit biopsy down the road. Purpose of biopsy is not only diagnostic but to see if there is any reversibility given the length of time this is happening -serologies pending. Will check FLC, Hep C, HIV, SPEP. C3 & C4 WNL -empiric steroids started 1/16 (solumedrol 1g x 3 days), will transition to prednisone '60mg'$  daily thereafter -started HD given severity of AKI on 1/16, appreciate assistance from surgery with RIJ temp line placement on 1/16 -plan for HD#2 tomorrow -Avoid nephrotoxic medications including NSAIDs and iodinated intravenous contrast exposure unless the latter is absolutely indicated.  Preferred narcotic agents for pain control are hydromorphone, fentanyl, and methadone. Morphine should not be used. Avoid Baclofen and avoid oral sodium phosphate and magnesium citrate based laxatives / bowel preps. Continue strict Input and Output monitoring. 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 EMS at bedside, taking her to [facility] for renal biopsy. BP 177/38, clonidine 0.'1mg'$  x 1 dose ordered. Patient reports that she did well with HD yesterday. She reports that her breathing is much better. Net uf 0.5L   Objective:   BP (!) 177/39   Pulse 66   Temp (!) 97.2 F (36.2 C) (Oral)   Resp 10   Ht '4\' 11"'$  (1.499 m)   Wt 53 kg   SpO2 96%   BMI 23.60 kg/m    Intake/Output Summary (Last 24 hours) at 05/19/2022 0936 Last data filed at 05/19/2022 1610 Gross per 24 hour  Intake 366 ml  Output 500 ml  Net -134 ml   Weight change: 0.3 kg  Physical Exam: Gen: NAD CVS: RRR Resp: decreased breath sounds bibasilar, bl chest expansion Abd: soft, nt/nd Ext: 1+ pitting edema Neuro: awake, alert  Imaging: DG Chest Port 1 View  Result Date: 05/18/2022 CLINICAL DATA:  Central line placement. EXAM: PORTABLE CHEST 1 VIEW COMPARISON:  CT chest and chest x-ray from 2 days ago. FINDINGS: New right internal jugular central venous catheter with tip at the cavoatrial junction. The heart size and mediastinal contours are within normal limits. Mild diffuse interstitial thickening similar. Decreasing small right and unchanged small left pleural effusions. Increasing asymmetric density in the peripheral left upper lobe. Similar atelectasis in the peripheral right middle lobe. Improving right lower lobe atelectasis. No pneumothorax. No acute osseous abnormality. IMPRESSION: 1. New right internal jugular central venous catheter with tip at the cavoatrial junction. No pneumothorax. 2. Similar diffuse interstitial thickening with increasing asymmetric density in the peripheral left upper lobe. Differential considerations include infection or pulmonary edema. 3. Decreased small right and unchanged small left pleural effusions. Electronically Signed   By: Titus Dubin M.D.   On: 05/18/2022 14:06   US RENAL  Result Date: 05/18/2022 CLINICAL DATA:  Chronic kidney disease EXAM: RENAL / URINARY TRACT ULTRASOUND COMPLETE COMPARISON:  Abdomen CT report 01/13/2018 FINDINGS: Right Kidney: Renal measurements: 8.9 x 4.7 x 4.4 =  volume: 97 mL. Kidney appears echogenic. No collecting system dilatation. There is a simple cyst along the midportion of the kidney which is anechoic with through transmission measuring 17 mm. Left Kidney: Renal measurements: 7.5 x 3.9 x 3.2 = volume: 49 mL.  Small echogenic appearing kidney. No collecting system dilatation. Anechoic rounded structure seen in the mid kidney measuring 2 cm. Through transmission is smoothly marginated. Bladder: Appears normal for degree of bladder distention. Other: Scattered ascites.  Probable right-sided pleural effusion. IMPRESSION: Echogenic small appearing kidneys. No collecting system dilatation. Bilateral simple appearing renal cysts. Ascites.  Right pleural effusion Electronically Signed   By: Jill Side M.D.   On: 05/18/2022 11:07    Labs: BMET Recent Labs  Lab 05/16/22 0309 05/16/22 0711 05/17/22 0402 05/18/22 0300 05/19/22 0433  NA 141  --  141 142 139  K 3.8  --  4.2 3.9 3.4*  CL 114*  --  112* 112* 105  CO2 11*  --  9* 8* 17*  GLUCOSE 86  --  62* 57* 100*  BUN 75*  --  108* 118* 85*  CREATININE 7.50*  --  7.51* 8.12* 5.57*  CALCIUM 7.4*  --  7.2* 7.2* 7.0*  PHOS  --  11.9*  --  12.8* 8.4*   CBC Recent Labs  Lab 05/16/22 0309 05/17/22 0757 05/18/22 0300 05/19/22 0433  WBC 15.3* 19.8* 20.2* 8.4  NEUTROABS 12.6*  --   --   --   HGB 10.1* 9.1* 8.5* 7.1*  HCT 30.6* 28.2* 26.2* 21.3*  MCV 79.1* 82.0 81.4 78.0*  PLT 254 200 157 135*    Medications:     amLODipine  10 mg Oral Daily   Chlorhexidine Gluconate Cloth  6 each Topical Daily   Chlorhexidine Gluconate Cloth  6 each Topical Q0600   darbepoetin (ARANESP) injection - DIALYSIS  150 mcg Subcutaneous Q Tue-1800   dextromethorphan-guaiFENesin  1 tablet Oral BID   feeding supplement  237 mL Oral BID BM   furosemide  80 mg Intravenous Q12H   [START ON 05/20/2022] heparin  5,000 Units Subcutaneous Q8H   hydrALAZINE  50 mg Oral TID   pantoprazole  40 mg Oral Daily   sodium bicarbonate  1,300 mg Oral BID      Gean Quint, MD [facility] 05/19/2022, 9:36 AM

## 2022-05-19 NOTE — Progress Notes (Signed)
Pharmacy Antibiotic Note  Sheri Cooper is a 72 y.o. female admitted on 05/16/2022 with SOB/HCAP and AKI.  Pharmacy has been consulted for  Cefepime dosing.   Patient on D#4 of cefepime for HCAP. AF, WBC WNL. HD catheter placed. Renal bx today  Plan: Continue Cefepime 1 g IV q24h F/U cxs and clinical progress Monitor V/S, labs  Height: '4\' 11"'$  (149.9 cm) Weight: 53 kg (116 lb 13.5 oz) IBW/kg (Calculated) : 43.2  Temp (24hrs), Avg:97.6 F (36.4 C), Min:97.2 F (36.2 C), Max:98.1 F (36.7 C)  Recent Labs  Lab 05/16/22 0309 05/16/22 0333 05/17/22 0402 05/17/22 0757 05/18/22 0300 05/19/22 0433  WBC 15.3*  --   --  19.8* 20.2* 8.4  CREATININE 7.50*  --  7.51*  --  8.12* 5.57*  LATICACIDVEN  --  0.5  --   --   --   --      Estimated Creatinine Clearance: 6.8 mL/min (A) (by C-G formula based on SCr of 5.57 mg/dL (H)).    No Known Allergies ABX: Vanco 1/14 >> 1/15 Cefepime 1/14 >>  Cultures: 1/14 Bcx: ngtd 1/14 MRSA PCR: neg  Isac Sarna, BS Pharm D, BCPS Clinical Pharmacist 05/19/2022 10:55 AM

## 2022-05-19 NOTE — Sedation Documentation (Signed)
Patient placed in prone position for procedure.

## 2022-05-19 NOTE — Consult Note (Signed)
Chief Complaint: Patient was seen in consultation today for random renal biopsy Chief Complaint  Patient presents with   Shortness of Breath   at the request of Dr Shirl Harris  Supervising Physician: Ruthann Cancer  Patient Status: AP IP  History of Present Illness: Sheri Cooper is a 71 y.o. female   HTN: COPD; RA Rapid progressive AKI Saw Dr Theador Hawthorne Dec 2023-- proteinuria; work up never able to move ahead Admitted to [facility] for COPD exacerbation and RSV PNA Jan 5-10, 2024 Renal function worsened Adm to AP with SOB 05/16/21  Renal following pt Started dialysis yesterday Dr Moshe Cipro note 1/16:  rapid decline of kidney function now 2 months ago-  difficult to tell what work up has been done-  has proteinuria and hematuria-  has had pulmonary issues that have kept her in the hospital the last 11 days 1.Renal- normal kidney function in April of 23-  very abnormal in November of 23 and has just progressively worsened.  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  Renal biopsy requested per Dr Moshe Cipro   Past Medical History:  Diagnosis Date   Chronic kidney disease (CKD), stage IV (severe) (HCC)    COPD (chronic obstructive pulmonary disease) (HCC)    Rheumatoid arthritis ([place])     Allergies: Patient has no known allergies.  Medications: Prior to Admission medications   Medication Sig Start Date End Date Taking? Authorizing Provider  ABRYSVO 120 MCG/0.5ML injection Inject 0.5 mLs into the muscle once. 01/18/22  Yes [provider]  amLODipine (NORVASC) 10 MG tablet Take 10 mg by mouth daily.   Yes [provider]  carvedilol (COREG) 25 MG tablet Take 25 mg by mouth 2 (two) times daily with a meal. 04/17/22  Yes [provider]  hydrALAZINE (APRESOLINE) 100 MG tablet Take 100 mg by mouth 2 (two) times daily. 04/17/22  Yes [provider]  hydrochlorothiazide (HYDRODIURIL) 25 MG tablet Take 25 mg by mouth daily. 01/06/22  Yes [provider]  rosuvastatin (CRESTOR) 20 MG tablet Take 20 mg by mouth daily.   Yes [provider]  sertraline (ZOLOFT) 50 MG tablet Take 1 tablet by mouth daily. 05/12/22 06/11/22 Yes [provider]  Adalimumab 20 MG/0.4ML PSKT Inject into the skin.    [provider]  metoprolol succinate (TOPROL-XL) 50 MG 24 hr tablet Take 50 mg by mouth daily. Patient not taking: Reported on 05/17/2022 03/31/22   [provider]     No family history on file.  Social History   Socioeconomic History   Marital status: Unknown    Spouse name: Not on file   Number of children: Not on file   Years of education: Not on file   Highest education level: Not on file  Occupational History   Not on file  Tobacco Use   Smoking status: Former    Types: Cigarettes   Smokeless tobacco: Not on file  Substance and Sexual Activity   Alcohol use: Not on file   Drug use: Never   Sexual activity: Not on file  Other Topics Concern   Not on file  Social History Narrative   Not on file   Social Determinants of Health   Financial Resource Strain: Not  on file  Food Insecurity: Not on file  Transportation Needs: Not on file  Physical Activity: Not on file  Stress: Not on file  Social Connections: Not on file    Review of Systems: A 12 point ROS discussed and pertinent positives are indicated in the HPI above.  All other systems are negative.  Review of Systems  Constitutional:  Positive for activity change, appetite change and fatigue.  Respiratory:  Positive for shortness of breath. Negative for cough.   Cardiovascular:  Negative for chest pain.  Neurological:  Positive for weakness.  Psychiatric/Behavioral:  Negative for behavioral problems  and confusion.     Vital Signs: BP (!) 165/56   Pulse 66   Temp (!) 97.2 F (36.2 C) (Oral)   Resp 10   Ht '4\' 11"'$  (1.499 m)   Wt 116 lb 13.5 oz (53 kg)   SpO2 96%   BMI 23.60 kg/m    Physical Exam Vitals reviewed.  Cardiovascular:     Rate and Rhythm: Normal rate and regular rhythm.  Pulmonary:     Effort: Pulmonary effort is normal.  Skin:    General: Skin is warm.  Neurological:     Mental Status: She is alert and oriented to person, place, and time.  Psychiatric:        Behavior: Behavior normal.     Imaging: DG Chest Port 1 View  Result Date: 05/18/2022 CLINICAL DATA:  Central line placement. EXAM: PORTABLE CHEST 1 VIEW COMPARISON:  CT chest and chest x-ray from 2 days ago. FINDINGS: New right internal jugular central venous catheter with tip at the cavoatrial junction. The heart size and mediastinal contours are within normal limits. Mild diffuse interstitial thickening similar. Decreasing small right and unchanged small left pleural effusions. Increasing asymmetric density in the peripheral left upper lobe. Similar atelectasis in the peripheral right middle lobe. Improving right lower lobe atelectasis. No pneumothorax. No acute osseous abnormality. IMPRESSION: 1. New right internal jugular central venous catheter with tip at the cavoatrial junction. No pneumothorax. 2. Similar diffuse interstitial thickening with increasing asymmetric density in the peripheral left upper lobe. Differential considerations include infection or pulmonary edema. 3. Decreased small right and unchanged small left pleural effusions. Electronically Signed   By: Titus Dubin M.D.   On: 05/18/2022 14:06   US RENAL  Result Date: 05/18/2022 CLINICAL DATA:  Chronic kidney disease EXAM: RENAL / URINARY TRACT ULTRASOUND COMPLETE COMPARISON:  Abdomen CT report 01/13/2018 FINDINGS: Right Kidney: Renal measurements: 8.9 x 4.7 x 4.4 = volume: 97 mL. Kidney appears echogenic. No collecting system  dilatation. There is a simple cyst along the midportion of the kidney which is anechoic with through transmission measuring 17 mm. Left Kidney: Renal measurements: 7.5 x 3.9 x 3.2 = volume: 49 mL. Small echogenic appearing kidney. No collecting system dilatation. Anechoic rounded structure seen in the mid kidney measuring 2 cm. Through transmission is smoothly marginated. Bladder: Appears normal for degree of bladder distention. Other: Scattered ascites.  Probable right-sided pleural effusion. IMPRESSION: Echogenic small appearing kidneys. No collecting system dilatation. Bilateral simple appearing renal cysts. Ascites.  Right pleural effusion Electronically Signed   By: Jill Side M.D.   On: 05/18/2022 11:07   CT Chest Wo Contrast  Result Date: 05/16/2022 CLINICAL DATA:  72 year old female with history of suspected pneumonia. EXAM: CT CHEST WITHOUT CONTRAST TECHNIQUE: Multidetector CT imaging of the chest was performed following the standard protocol without IV contrast. RADIATION DOSE REDUCTION: This exam was performed according to the  departmental dose-optimization program which includes automated exposure control, adjustment of the mA and/or kV according to patient size and/or use of iterative reconstruction technique. COMPARISON:  Chest x-ray 05/16/2022. FINDINGS: Cardiovascular: Heart size is normal. There is no significant pericardial fluid, thickening or pericardial calcification. Aortic atherosclerosis. No definite coronary artery calcifications. Mild calcifications of the aortic valve. Mediastinum/Nodes: No pathologically enlarged mediastinal or hilar lymph nodes. Please note that accurate exclusion of hilar adenopathy is limited on noncontrast CT scans. Esophagus is unremarkable in appearance. No axillary lymphadenopathy. Lungs/Pleura: Moderate to large right and small to moderate left pleural effusions lying dependently associated with areas of passive subsegmental atelectasis in the lower lobes of  the lungs bilaterally. In addition, there are mid to upper lung predominant patchy areas of ground-glass attenuation and nodular appearing consolidative airspace disease, likely to reflect multilobar bilateral bronchopneumonia. The largest of these nodular appearing regions in the apex of the left upper lobe (axial image 32 of series 4) measuring 1.3 x 1.2 cm. Upper Abdomen: Aortic atherosclerosis. Tiny partially calcified gallstones lying dependently in the fundus of the gallbladder. Small volume of ascites. Multiple renal lesions bilaterally, incompletely characterized on today's noncontrast CT examination, most notable for an exophytic intermediate attenuation lesion (25 HU) measuring 1.9 cm in the anterior aspect of the interpolar region of the left kidney. A cluster of nonobstructive calculi is also noted in the posterior aspect of the upper pole collecting system of the right kidney measuring up to 3 mm. Musculoskeletal: There are no aggressive appearing lytic or blastic lesions noted in the visualized portions of the skeleton. IMPRESSION: 1. The appearance the chest is most suggestive of multilobar bilateral bronchopneumonia with bilateral parapneumonic pleural effusions, as above. 2. Given the nodular appearance of some of the areas of apparent airspace consolidation, follow-up noncontrast chest CT is recommended in 3 months to ensure resolution of the nodular areas following treatment for the patient's acute illness, to exclude underlying neoplasm. 3. Multiple renal lesions bilaterally, incompletely characterized on today's noncontrast CT examination, including an indeterminate 1.9 cm lesion in the anterior aspect of the interpolar region of the left kidney. Follow-up nonemergent outpatient abdominal MRI with and without IV gadolinium is recommended in the near future to definitively evaluate this lesion and exclude neoplasm. 4. Cholelithiasis. 5. Nonobstructive calculi in the right renal collecting system  measuring up to 3 mm. 6. Ascites. 7. Aortic atherosclerosis. Aortic Atherosclerosis (ICD10-I70.0). Electronically Signed   By: Vinnie Langton M.D.   On: 05/16/2022 05:23   DG Chest Port 1 View  Result Date: 05/16/2022 CLINICAL DATA:  Shortness of breath EXAM: PORTABLE CHEST 1 VIEW COMPARISON:  05/07/2022 FINDINGS: Cardiac shadow is enlarged but stable. Small pleural effusion on the right is again noted. Some increased basilar density is noted on the right when compared with the prior study. Postsurgical changes in the left breast are seen. No bony abnormality is noted. IMPRESSION: Persistent right-sided effusion with slight increase in basilar infiltrate on the right. Electronically Signed   By: Inez Catalina M.D.   On: 05/16/2022 03:45    Labs:  CBC: Recent Labs    05/16/22 0309 05/17/22 0757 05/18/22 0300 05/19/22 0433  WBC 15.3* 19.8* 20.2* 8.4  HGB 10.1* 9.1* 8.5* 7.1*  HCT 30.6* 28.2* 26.2* 21.3*  PLT 254 200 157 135*    COAGS: Recent Labs    05/19/22 0433  INR 1.2    BMP: Recent Labs    05/16/22 0309 05/17/22 0402 05/18/22 0300 05/19/22 0433  NA 141 141  142 139  K 3.8 4.2 3.9 3.4*  CL 114* 112* 112* 105  CO2 11* 9* 8* 17*  GLUCOSE 86 62* 57* 100*  BUN 75* 108* 118* 85*  CALCIUM 7.4* 7.2* 7.2* 7.0*  CREATININE 7.50* 7.51* 8.12* 5.57*  GFRNONAA 5* 5* 5* 8*    LIVER FUNCTION TESTS: Recent Labs    05/16/22 0309 05/17/22 0402 05/18/22 0300 05/19/22 0433  BILITOT 0.9 0.8  --   --   AST 23 38  --   --   ALT 28 38  --   --   ALKPHOS 163* 260*  --   --   PROT 6.4* 5.8*  --   --   ALBUMIN 2.8* 2.6* 2.4* 2.2*    TUMOR MARKERS: No results for input(s): "AFPTM", "CEA", "CA199", "CHROMGRNA" in the last 8760 hours.  Assessment and Plan:  Rapid progression acute renal injury Random renal biopsy-- scheduled today at [facility] rad BP this am came down to 149/53 after Labetolol--- but now back to 165/39 now Discussing with Dr Moshe Cipro med needs--- IR needs BP  to be under 355 systolic to safely proceed. Risks and benefits of random renal biopsy was discussed with the patient and/or patient's family including, but not limited to bleeding, infection, damage to adjacent structures or low yield requiring additional tests.  All of the questions were answered and there is agreement to proceed.  Consent signed and in chart.  Thank you for this interesting consult.  I greatly enjoyed meeting Sheri Cooper and look forward to participating in their care.  A copy of this report was sent to the requesting provider on this date.  Electronically Signed: Lavonia Drafts, PA-C 05/19/2022, 7:51 AM   I spent a total of 20 Minutes    in face to face in clinical consultation, greater than 50% of which was counseling/coordinating care for random renal biopsy

## 2022-05-19 NOTE — Progress Notes (Signed)
[facility]  Unable to evaluate patient's catheter secondary to her being at [facility] for renal biopsy.  Per nursing staff, no issues with the patient's right IJ HD catheter.  HD ran yesterday without issue.  Please call with any questions or concerns related to the catheter.  Graciella Freer, DO [facility] [address] [place], [place] [address] [phone] (office)

## 2022-05-19 NOTE — Progress Notes (Signed)
Pt transporting to [facility] for Renal biopsy via carelink. Orders stated BP 150 or below. Pt has been hypertensive at 168/39. Given labetalol at 07:36 and BP slowly came down   0745: 166/36 0750: 165/39 0755: 163/38 0800: 159/46 0810 162/37 0815 152/36 0825 168/40 0830 177/39 (departed AP with Care link)  Clonidine 0.'1mg'$  given at 0837 Dr. Candiss Norse put in order for clonidine and stated that with the medication plus the sedation for the procedure BP should come down to 150 or below. " Worst case scenario, we can't do it and she comes back here"  Georgina Peer

## 2022-05-19 NOTE — Procedures (Signed)
Interventional Radiology Procedure Note  Procedure: Ultrasound guided renal biopsy  Findings: Please refer to procedural dictation for full description. 16 ga core x2 from left inferior pole.  Gelfoam slurry needle track embolization.  Complications: None immediate  Estimated Blood Loss: < 5 mL  Recommendations: Strict 3 hour bedrest. Follow up Pathology results.   Ruthann Cancer, MD

## 2022-05-19 NOTE — TOC Progression Note (Signed)
Transition of Care [facility]) - Progression Note    Patient Details  Name: Sheri Cooper MRN: [id number] Date of Birth: Sep 15, 1950  [facility]) CM/SW Contact  Boneta Lucks, RN Phone Number: 05/19/2022, 3:20 PM  Clinical Narrative:   Sheri Cooper is holding a chair for her MWF, second shift 10:45 - 12:15. MD unsure of date of discharge. Biopsy result pending, still has temporary access. TOC to follow.   Expected Discharge Plan: Home/Self Care Barriers to Discharge: Continued Medical Work up  Expected Discharge Plan and Services     Post Acute Care Choice: Dialysis Living arrangements for the past 2 months: Apartment                    Social Determinants of Health (SDOH) Interventions SDOH Screenings   Tobacco Use: Medium Risk (05/16/2022)    Readmission Risk Interventions    05/18/2022   11:51 AM  Readmission Risk Prevention Plan  Transportation Screening Complete  PCP or Specialist Appt within 5-7 Days Not Complete  Home Care Screening Complete  Medication Review (RN CM) Complete

## 2022-05-20 DIAGNOSIS — R7989 Other specified abnormal findings of blood chemistry: Secondary | ICD-10-CM | POA: Diagnosis not present

## 2022-05-20 DIAGNOSIS — T68XXXD Hypothermia, subsequent encounter: Secondary | ICD-10-CM | POA: Diagnosis not present

## 2022-05-20 DIAGNOSIS — J449 Chronic obstructive pulmonary disease, unspecified: Secondary | ICD-10-CM | POA: Diagnosis not present

## 2022-05-20 DIAGNOSIS — J189 Pneumonia, unspecified organism: Secondary | ICD-10-CM | POA: Diagnosis not present

## 2022-05-20 LAB — RENAL FUNCTION PANEL
Albumin: 2.1 g/dL — ABNORMAL LOW (ref 3.5–5.0)
Anion gap: 17 — ABNORMAL HIGH (ref 5–15)
BUN: 113 mg/dL — ABNORMAL HIGH (ref 8–23)
CO2: 15 mmol/L — ABNORMAL LOW (ref 22–32)
Calcium: 6.8 mg/dL — ABNORMAL LOW (ref 8.9–10.3)
Chloride: 106 mmol/L (ref 98–111)
Creatinine, Ser: 5.92 mg/dL — ABNORMAL HIGH (ref 0.44–1.00)
GFR, Estimated: 7 mL/min — ABNORMAL LOW (ref >60)
Glucose, Bld: 141 mg/dL — ABNORMAL HIGH (ref 70–99)
Phosphorus: 8.8 mg/dL — ABNORMAL HIGH (ref 2.5–4.6)
Potassium: 3.2 mmol/L — ABNORMAL LOW (ref 3.5–5.1)
Sodium: 138 mmol/L (ref 135–145)

## 2022-05-20 LAB — CBC
HCT: 18.7 % — ABNORMAL LOW (ref 36.0–46.0)
Hemoglobin: 6.3 g/dL — CL (ref 12.0–15.0)
MCH: 26.1 pg (ref 26.0–34.0)
MCHC: 33.7 g/dL (ref 30.0–36.0)
MCV: 77.6 fL — ABNORMAL LOW (ref 80.0–100.0)
Platelets: 129 10*3/uL — ABNORMAL LOW (ref 150–400)
RBC: 2.41 MIL/uL — ABNORMAL LOW (ref 3.87–5.11)
RDW: 18.2 % — ABNORMAL HIGH (ref 11.5–15.5)
WBC: 13.1 10*3/uL — ABNORMAL HIGH (ref 4.0–10.5)
nRBC: 0.2 % (ref 0.0–0.2)

## 2022-05-20 LAB — STOOL CULTURE: E coli, Shiga toxin Assay: NEGATIVE

## 2022-05-20 LAB — STOOL CULTURE REFLEX - CMPCXR

## 2022-05-20 LAB — KAPPA/LAMBDA LIGHT CHAINS
Kappa free light chain: 49.1 mg/L — ABNORMAL HIGH (ref 3.3–19.4)
Kappa, lambda light chain ratio: 1.52 (ref 0.26–1.65)
Lambda free light chains: 32.2 mg/L — ABNORMAL HIGH (ref 5.7–26.3)

## 2022-05-20 LAB — MAGNESIUM: Magnesium: 2.2 mg/dL (ref 1.7–2.4)

## 2022-05-20 LAB — HEMOGLOBIN AND HEMATOCRIT, BLOOD
HCT: 21.9 % — ABNORMAL LOW (ref 36.0–46.0)
Hemoglobin: 7.6 g/dL — ABNORMAL LOW (ref 12.0–15.0)

## 2022-05-20 LAB — PREPARE RBC (CROSSMATCH)

## 2022-05-20 LAB — ABO/RH: ABO/RH(D): O POS

## 2022-05-20 LAB — STOOL CULTURE REFLEX - RSASHR

## 2022-05-20 LAB — HEPATITIS B SURFACE ANTIBODY, QUANTITATIVE: Hep B S AB Quant (Post): <3.1 m[IU]/mL — ABNORMAL LOW (ref >9.9)

## 2022-05-20 LAB — OCCULT BLOOD X 1 CARD TO LAB, STOOL: Fecal Occult Bld: POSITIVE — AB

## 2022-05-20 MED ORDER — SODIUM CHLORIDE 0.9% IV SOLUTION: Freq: Once | INTRAVENOUS | Status: AC

## 2022-05-20 MED ORDER — HEPARIN SODIUM (PORCINE) 1000 UNIT/ML IJ SOLN
INTRAMUSCULAR | Status: AC
  Administered 2022-05-20: 2600 [IU]
  Filled 2022-05-20: qty 4

## 2022-05-20 NOTE — TOC Progression Note (Signed)
Transition of Care [facility]) - Progression Note    Patient Details  Name: Sheri Cooper MRN: [id number] Date of Birth: [birth year]  Transition of Care [facility]) CM/SW Contact  Boneta Lucks, RN Phone Number: 05/20/2022, 10:50 AM  Clinical Narrative:   Javier Docker called with chair time.   TTS , starts 1/23, arrive at 10:30 am for paper work, needs to bring ID, Ins Card, SS card, normal treatment time will be 11:30am   Added to AVS and explained to family. TOC will follow for DC.   Expected Discharge Plan: Home/Self Care Barriers to Discharge: Continued Medical Work up  Expected Discharge Plan and Services     Post Acute Care Choice: Dialysis Living arrangements for the past 2 months: Apartment                   Social Determinants of Health (SDOH) Interventions SDOH Screenings   Food Insecurity: No Food Insecurity (05/19/2022)  Housing: Low Risk  (05/19/2022)  Transportation Needs: No Transportation Needs (05/19/2022)  Utilities: Not At Risk (05/19/2022)  Tobacco Use: Medium Risk (05/19/2022)    Readmission Risk Interventions    05/18/2022   11:51 AM  Readmission Risk Prevention Plan  Transportation Screening Complete  PCP or Specialist Appt within 5-7 Days Not Complete  Home Care Screening Complete  Medication Review (RN CM) Complete

## 2022-05-20 NOTE — Procedures (Signed)
   HEMODIALYSIS TREATMENT NOTE (HD#2):   Uneventful 2.5 hour heparin-free treatment completed using RIJ catheter.  Cath tolerated prescribed flow with stable pressures.  UF was limited by hypotension; will try Albumin tomorrow.  Net UF 600 ml. One unit pRBCs was transfused.  All blood was returned.   Post HD v/s:  t 97.6,  153/53 (82),  p71,  r11,  spO2 98% (room air), weight 50.1kg     Rockwell Alexandria, RN AP [facility]

## 2022-05-20 NOTE — Progress Notes (Signed)
Patient with lower hemoglobin of 6.3. Order to give 1 unit of blood with dialysis. Dialysis nurse aware. Patient with a large bloody stool. Occult stool card collected. Result positive. Dr. Wynetta Emery made aware.

## 2022-05-20 NOTE — Progress Notes (Signed)
[facility] Progress Note    Assessment/ Plan:   AKI -worsening since April '23 with hematuria and proteinuria. S/p Renal biopsy 1/17. Purpose of biopsy is not only diagnostic but to see if there is any reversibility given the length of time this is happening. Biopsy report pending -ANCA, ANA, anti-GBM neg, C3/C4 WNL. HIV, Hep B neg. SPEP, FLC pending -empiric steroids started 1/16 (solumedrol 1g x 3 days)--3rd dose today, will transition to prednisone '60mg'$  daily starting 1/19 -started HD given severity of AKI on 1/16, appreciate assistance from surgery with RIJ temp line placement on 1/16, will need this to be converted to [facility], will consult IR -plan for HD#2 today, #3 tentatively planned for tomorrow -will need outpatient placement -Avoid nephrotoxic medications including NSAIDs and iodinated intravenous contrast exposure unless the latter is absolutely indicated.  Preferred narcotic agents for pain control are hydromorphone, fentanyl, and methadone. Morphine should not be used. Avoid Baclofen and avoid oral sodium phosphate and magnesium citrate based laxatives / bowel preps. Continue strict Input and Output monitoring. 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 S/p renal biopsy yesterday with IR which she reports that she tolerated. Does report that her breathing is better as compared to yesterday. No UOP recorded.   Objective:   BP (!) 167/37   Pulse 70   Temp 98.3 F (36.8 C) (Oral)   Resp 10   Ht 4'  11" (1.499 m)   Wt 50.7 kg   SpO2 97%   BMI 22.58 kg/m   Intake/Output Summary (Last 24 hours) at 05/20/2022 0943 Last data filed at 05/20/2022 5366 Gross per 24 hour  Intake 264.68 ml  Output --  Net 264.68 ml   Weight change: -5.5 kg  Physical Exam: Gen: NAD, sitting up in bed CVS: RRR Resp: decreased breath sounds bibasilar, bl chest expansion Abd: soft, nt/nd Ext: trace pitting edema b/l LEs Neuro: awake, alert Dialysis access: RIJ temp HD line  Imaging: US BIOPSY (KIDNEY)  Result Date: 05/19/2022 INDICATION: 71 year old female with history of end-stage renal disease. EXAM: ULTRASOUND GUIDED RENAL BIOPSY COMPARISON:  None Available. MEDICATIONS: None. ANESTHESIA/SEDATION: Fentanyl 25 mcg IV; Versed 0.5 mg IV Total Moderate Sedation time: 10 minutes; The patient was continuously monitored during the procedure by the interventional radiology nurse under my direct supervision. COMPLICATIONS: None immediate. PROCEDURE: Informed written consent was obtained from the patient after a discussion of the risks, benefits and alternatives to treatment. The patient understands and consents the procedure. A timeout was performed prior to the initiation of the procedure. Ultrasound scanning was performed of the bilateral flanks. The inferior pole of the left kidney was selected for biopsy due to location and sonographic window. The procedure was planned. The operative site was prepped and draped in the usual sterile fashion. The overlying soft tissues were anesthetized with 1% lidocaine with epinephrine. A 17 gauge coaxial introducer needle was advanced into the inferior cortex of the left kidney and 2 core biopsies were obtained under direct ultrasound guidance with a 16 gauge core biopsy device.  Images were saved for documentation purposes. The biopsy device was removed and hemostasis was obtained with manual compression after injection of Gel-Foam slurry along the needle track under ultrasound  guidance. Post procedural scanning was negative for significant post procedural hemorrhage or additional complication. A dressing was placed. The patient tolerated the procedure well without immediate post procedural complication. IMPRESSION: Technically successful ultrasound guided left renal biopsy. Ruthann Cancer, MD [facility] Radiology Electronically Signed   By: Ruthann Cancer M.D.   On: 05/19/2022 14:19   DG Chest Port 1 View  Result Date: 05/18/2022 CLINICAL DATA:  Central line placement. EXAM: PORTABLE CHEST 1 VIEW COMPARISON:  CT chest and chest x-ray from 2 days ago. FINDINGS: New right internal jugular central venous catheter with tip at the cavoatrial junction. The heart size and mediastinal contours are within normal limits. Mild diffuse interstitial thickening similar. Decreasing small right and unchanged small left pleural effusions. Increasing asymmetric density in the peripheral left upper lobe. Similar atelectasis in the peripheral right middle lobe. Improving right lower lobe atelectasis. No pneumothorax. No acute osseous abnormality. IMPRESSION: 1. New right internal jugular central venous catheter with tip at the cavoatrial junction. No pneumothorax. 2. Similar diffuse interstitial thickening with increasing asymmetric density in the peripheral left upper lobe. Differential considerations include infection or pulmonary edema. 3. Decreased small right and unchanged small left pleural effusions. Electronically Signed   By: Titus Dubin M.D.   On: 05/18/2022 14:06   US RENAL  Result Date: 05/18/2022 CLINICAL DATA:  Chronic kidney disease EXAM: RENAL / URINARY TRACT ULTRASOUND COMPLETE COMPARISON:  Abdomen CT report 01/13/2018 FINDINGS: Right Kidney: Renal measurements: 8.9 x 4.7 x 4.4 = volume: 97 mL. Kidney appears echogenic. No collecting system dilatation. There is a simple cyst along the midportion of the kidney which is anechoic with  through transmission measuring 17 mm. Left Kidney: Renal measurements: 7.5 x 3.9 x 3.2 = volume: 49 mL. Small echogenic appearing kidney. No collecting system dilatation. Anechoic rounded structure seen in the mid kidney measuring 2 cm. Through transmission is smoothly marginated. Bladder: Appears normal for degree of bladder distention. Other: Scattered ascites.  Probable right-sided pleural effusion. IMPRESSION: Echogenic small appearing kidneys. No collecting system dilatation. Bilateral simple appearing renal cysts. Ascites.  Right pleural effusion Electronically Signed   By: Jill Side M.D.   On: 05/18/2022 11:07    Labs: BMET Recent Labs  Lab 05/16/22 0309 05/16/22 0711 05/17/22 0402 05/18/22 0300 05/19/22 0433 05/20/22 0501  NA 141  --  141 142 139 138  K 3.8  --  4.2 3.9 3.4* 3.2*  CL 114*  --  112* 112* 105 106  CO2 11*  --  9* 8* 17* 15*  GLUCOSE 86  --  62* 57* 100* 141*  BUN 75*  --  108* 118* 85* 113*  CREATININE 7.50*  --  7.51* 8.12* 5.57* 5.92*  CALCIUM 7.4*  --  7.2* 7.2* 7.0* 6.8*  PHOS  --  11.9*  --  12.8* 8.4* 8.8*   CBC Recent Labs  Lab 05/16/22 0309 05/17/22 0757 05/18/22 0300 05/19/22 0433 05/20/22 0501  WBC 15.3* 19.8* 20.2* 8.4 13.1*  NEUTROABS 12.6*  --   --   --   --   HGB 10.1* 9.1* 8.5* 7.1* 6.3*  HCT 30.6* 28.2* 26.2* 21.3* 18.7*  MCV 79.1* 82.0 81.4 78.0* 77.6*  PLT 254 200 157 135* 129*    Medications:     sodium chloride  Intravenous Once   amLODipine  10 mg Oral Daily   calcium acetate  667 mg Oral TID WC   Chlorhexidine Gluconate Cloth  6 each Topical Daily   Chlorhexidine Gluconate Cloth  6 each Topical Q0600   darbepoetin (ARANESP) injection - DIALYSIS  150 mcg Subcutaneous Q Tue-1800   dextromethorphan-guaiFENesin  1 tablet Oral BID   feeding supplement  237 mL Oral BID BM   furosemide  80 mg Intravenous Q12H   heparin  5,000 Units Subcutaneous Q8H   hydrALAZINE  50 mg Oral TID   pantoprazole  40 mg Oral Daily   sodium  bicarbonate  1,300 mg Oral BID      Gean Quint, MD [facility] 05/20/2022, 9:43 AM

## 2022-05-20 NOTE — Progress Notes (Signed)
[facility] has been called and patient has been scheduled for a round trip transport to [facility] on Monday 1/22 for insertion of a tunneled dialysis catheter. [facility] wants patient there by 0830. [facility] said they will dispatch out around 0730 so ETA to [facility] will be around 0800. Consent has been obtained and is placed in a packet for [facility] to take with patient.

## 2022-05-20 NOTE — Consult Note (Signed)
Chief Complaint: Patient was seen in consultation today for tunneled dialysis catheter placement Chief Complaint  Patient presents with   Shortness of Breath   at the request of Dr Loyal Gambler  Supervising Physician: Corrie Mckusick  Patient Status: [facility] IP  History of Present Illness: Sheri Cooper is a 72 y.o. female   Known to IR Random renal biopsy 05/19/22 Rapid progressive renal decline Proteinuria  Non tunneled Rt IJ dialysis catheter placed in OR at [facility] 05/18/22 On IP dialysis now in ICU room  Need for ongoing dialysis per Nephrology Request made for tunneled dialysis catheter placement vs conversion of temp Pt is scheduled for procedure 1/22 in IR at [facility]   Past Medical History:  Diagnosis Date   Chronic kidney disease (CKD), stage IV (severe) (HCC)    COPD (chronic obstructive pulmonary disease) ([place])    Rheumatoid arthritis ([place])     History reviewed. No pertinent surgical history.  Allergies: Patient has no known allergies.  Medications: Prior to Admission medications   Medication Sig Start Date End Date Taking? Authorizing Provider  ABRYSVO 120 MCG/0.5ML injection Inject 0.5 mLs into the muscle once. 01/18/22  Yes [provider]  amLODipine (NORVASC) 10 MG tablet Take 10 mg by mouth daily.   Yes [provider]  carvedilol (COREG) 25 MG tablet Take 25 mg by mouth 2 (two) times daily with a meal. 04/17/22  Yes [provider]  hydrALAZINE (APRESOLINE) 100 MG tablet Take 100 mg by mouth 2 (two) times daily. 04/17/22  Yes [provider]  hydrochlorothiazide (HYDRODIURIL) 25 MG tablet Take 25 mg by mouth daily. 01/06/22  Yes [provider]  rosuvastatin (CRESTOR) 20 MG tablet Take 20 mg by mouth daily.   Yes [provider]  sertraline (ZOLOFT) 50 MG tablet Take 1 tablet by mouth daily. 05/12/22 06/11/22 Yes [provider]  Adalimumab 20 MG/0.4ML PSKT Inject into the skin.    [provider]  metoprolol succinate (TOPROL-XL) 50 MG 24 hr tablet Take 50 mg by mouth daily. Patient not taking: Reported on 05/17/2022 03/31/22   [provider]     History reviewed. No pertinent family history.  Social History   Socioeconomic History   Marital status: Unknown    Spouse name: Not on file   Number of children: Not on file   Years of education: Not on file   Highest education level: Not on file  Occupational History   Not on file  Tobacco Use   Smoking status: Former    Types: Cigarettes   Smokeless tobacco: Never  Substance and Sexual Activity   Alcohol use: Not on file   Drug use: Never   Sexual activity: Not on file  Other Topics Concern   Not on file  Social History Narrative   Not on file   Social Determinants of Health   Financial Resource Strain: Not on file  Food Insecurity: No Food Insecurity (05/19/2022)   Hunger Vital Sign    Worried About Running Out of Food in the Last Year: Never true    Ran Out of Food in the Last Year: Never true  Transportation Needs: No Transportation Needs (05/19/2022)   PRAPARE - Hydrologist (Medical): No    Lack of Transportation (Non-Medical): No  Physical Activity: Not on file  Stress: Not on file  Social Connections: Not on file    Review of Systems: A 12 point ROS discussed and pertinent positives are  indicated in the HPI above.  All other systems are negative.  Review of Systems  Constitutional:  Positive for activity change and fatigue.  Respiratory:  Positive for shortness of breath.   Cardiovascular:  Positive for leg swelling.  Neurological:  Positive for weakness.  Psychiatric/Behavioral:  Negative for behavioral problems and confusion.     Vital Signs: BP (!) 95/35   Pulse 85   Temp (!) 97.5 F (36.4 C) (Oral)   Resp 11   Ht '4\' 11"'$  (1.499 m)   Wt 112 lb 3.4 oz (50.9 kg)   SpO2 99%   BMI 22.66 kg/m   Physical Exam Vitals reviewed.  HENT:     Mouth/Throat:      Mouth: Mucous membranes are moist.  Cardiovascular:     Rate and Rhythm: Normal rate and regular rhythm.  Pulmonary:     Breath sounds: Wheezing present.  Musculoskeletal:        General: Normal range of motion.     Right lower leg: Edema present.     Left lower leg: Edema present.     Comments: Pitting edema all extremities  Skin:    General: Skin is warm.  Neurological:     Mental Status: She is alert and oriented to person, place, and time.  Psychiatric:        Behavior: Behavior normal.     Imaging: US BIOPSY (KIDNEY)  Result Date: 05/19/2022 INDICATION: 72 year old female with history of end-stage renal disease. EXAM: ULTRASOUND GUIDED RENAL BIOPSY COMPARISON:  None Available. MEDICATIONS: None. ANESTHESIA/SEDATION: Fentanyl 25 mcg IV; Versed 0.5 mg IV Total Moderate Sedation time: 10 minutes; The patient was continuously monitored during the procedure by the interventional radiology nurse under my direct supervision. COMPLICATIONS: None immediate. PROCEDURE: Informed written consent was obtained from the patient after a discussion of the risks, benefits and alternatives to treatment. The patient understands and consents the procedure. A timeout was performed prior to the initiation of the procedure. Ultrasound scanning was performed of the bilateral flanks. The inferior pole of the left kidney was selected for biopsy due to location and sonographic window. The procedure was planned. The operative site was prepped and draped in the usual sterile fashion. The overlying soft tissues were anesthetized with 1% lidocaine with epinephrine. A 17 gauge coaxial introducer needle was advanced into the inferior cortex of the left kidney and 2 core biopsies were obtained under direct ultrasound guidance with a 16 gauge core biopsy device. Images were saved for documentation purposes. The biopsy device was removed and hemostasis was obtained with manual compression after injection of Gel-Foam slurry  along the needle track under ultrasound guidance. Post procedural scanning was negative for significant post procedural hemorrhage or additional complication. A dressing was placed. The patient tolerated the procedure well without immediate post procedural complication. IMPRESSION: Technically successful ultrasound guided left renal biopsy. Ruthann Cancer, MD [facility] Electronically Signed   By: Ruthann Cancer M.D.   On: 05/19/2022 14:19   DG Chest Port 1 View  Result Date: 05/18/2022 CLINICAL DATA:  Central line placement. EXAM: PORTABLE CHEST 1 VIEW COMPARISON:  CT chest and chest x-ray from 2 days ago. FINDINGS: New right internal jugular central venous catheter with tip at the cavoatrial junction. The heart size and mediastinal contours are within normal limits. Mild diffuse interstitial thickening similar. Decreasing small right and unchanged small left pleural effusions. Increasing asymmetric density in the peripheral left upper lobe. Similar atelectasis in the peripheral right  middle lobe. Improving right lower lobe atelectasis. No pneumothorax. No acute osseous abnormality. IMPRESSION: 1. New right internal jugular central venous catheter with tip at the cavoatrial junction. No pneumothorax. 2. Similar diffuse interstitial thickening with increasing asymmetric density in the peripheral left upper lobe. Differential considerations include infection or pulmonary edema. 3. Decreased small right and unchanged small left pleural effusions. Electronically Signed   By: Titus Dubin M.D.   On: 05/18/2022 14:06   US RENAL  Result Date: 05/18/2022 CLINICAL DATA:  Chronic kidney disease EXAM: RENAL / URINARY TRACT ULTRASOUND COMPLETE COMPARISON:  Abdomen CT report 01/13/2018 FINDINGS: Right Kidney: Renal measurements: 8.9 x 4.7 x 4.4 = volume: 97 mL. Kidney appears echogenic. No collecting system dilatation. There is a simple cyst along the  midportion of the kidney which is anechoic with through transmission measuring 17 mm. Left Kidney: Renal measurements: 7.5 x 3.9 x 3.2 = volume: 49 mL. Small echogenic appearing kidney. No collecting system dilatation. Anechoic rounded structure seen in the mid kidney measuring 2 cm. Through transmission is smoothly marginated. Bladder: Appears normal for degree of bladder distention. Other: Scattered ascites.  Probable right-sided pleural effusion. IMPRESSION: Echogenic small appearing kidneys. No collecting system dilatation. Bilateral simple appearing renal cysts. Ascites.  Right pleural effusion Electronically Signed   By: Jill Side M.D.   On: 05/18/2022 11:07   CT Chest Wo Contrast  Result Date: 05/16/2022 CLINICAL DATA:  72 year old female with history of suspected pneumonia. EXAM: CT CHEST WITHOUT CONTRAST TECHNIQUE: Multidetector CT imaging of the chest was performed following the standard protocol without IV contrast. RADIATION DOSE REDUCTION: This exam was performed according to the departmental dose-optimization program which includes automated exposure control, adjustment of the mA and/or kV according to patient size and/or use of iterative reconstruction technique. COMPARISON:  Chest x-ray 05/16/2022. FINDINGS: Cardiovascular: Heart size is normal. There is no significant pericardial fluid, thickening or pericardial calcification. Aortic atherosclerosis. No definite coronary artery calcifications. Mild calcifications of the aortic valve. Mediastinum/Nodes: No pathologically enlarged mediastinal or hilar lymph nodes. Please note that accurate exclusion of hilar adenopathy is limited on noncontrast CT scans. Esophagus is unremarkable in appearance. No axillary lymphadenopathy. Lungs/Pleura: Moderate to large right and small to moderate left pleural effusions lying dependently associated with areas of passive subsegmental atelectasis in the lower lobes of the lungs bilaterally. In addition, there  are mid to upper lung predominant patchy areas of ground-glass attenuation and nodular appearing consolidative airspace disease, likely to reflect multilobar bilateral bronchopneumonia. The largest of these nodular appearing regions in the apex of the left upper lobe (axial image 32 of series 4) measuring 1.3 x 1.2 cm. Upper Abdomen: Aortic atherosclerosis. Tiny partially calcified gallstones lying dependently in the fundus of the gallbladder. Small volume of ascites. Multiple renal lesions bilaterally, incompletely characterized on today's noncontrast CT examination, most notable for an exophytic intermediate attenuation lesion (25 HU) measuring 1.9 cm in the anterior aspect of the interpolar region of the left kidney. A cluster of nonobstructive calculi is also noted in the posterior aspect of the upper pole collecting system of the right kidney measuring up to 3 mm. Musculoskeletal: There are no aggressive appearing lytic or blastic lesions noted in the visualized portions of the skeleton. IMPRESSION: 1. The appearance the chest is most suggestive of multilobar bilateral bronchopneumonia with bilateral parapneumonic pleural effusions, as above. 2. Given the nodular appearance of some of the areas of apparent airspace consolidation, follow-up noncontrast chest CT is recommended in 3 months to ensure resolution  of the nodular areas following treatment for the patient's acute illness, to exclude underlying neoplasm. 3. Multiple renal lesions bilaterally, incompletely characterized on today's noncontrast CT examination, including an indeterminate 1.9 cm lesion in the anterior aspect of the interpolar region of the left kidney. Follow-up nonemergent outpatient abdominal MRI with and without IV gadolinium is recommended in the near future to definitively evaluate this lesion and exclude neoplasm. 4. Cholelithiasis. 5. Nonobstructive calculi in the right renal collecting system measuring up to 3 mm. 6. Ascites. 7.  Aortic atherosclerosis. Aortic Atherosclerosis (ICD10-I70.0). Electronically Signed   By: Vinnie Langton M.D.   On: 05/16/2022 05:23   DG Chest Port 1 View  Result Date: 05/16/2022 CLINICAL DATA:  Shortness of breath EXAM: PORTABLE CHEST 1 VIEW COMPARISON:  05/07/2022 FINDINGS: Cardiac shadow is enlarged but stable. Small pleural effusion on the right is again noted. Some increased basilar density is noted on the right when compared with the prior study. Postsurgical changes in the left breast are seen. No bony abnormality is noted. IMPRESSION: Persistent right-sided effusion with slight increase in basilar infiltrate on the right. Electronically Signed   By: Inez Catalina M.D.   On: 05/16/2022 03:45    Labs:  CBC: Recent Labs    05/17/22 0757 05/18/22 0300 05/19/22 0433 05/20/22 0501  WBC 19.8* 20.2* 8.4 13.1*  HGB 9.1* 8.5* 7.1* 6.3*  HCT 28.2* 26.2* 21.3* 18.7*  PLT 200 157 135* 129*    COAGS: Recent Labs    05/19/22 0433  INR 1.2    BMP: Recent Labs    05/17/22 0402 05/18/22 0300 05/19/22 0433 05/20/22 0501  NA 141 142 139 138  K 4.2 3.9 3.4* 3.2*  CL 112* 112* 105 106  CO2 9* 8* 17* 15*  GLUCOSE 62* 57* 100* 141*  BUN 108* 118* 85* 113*  CALCIUM 7.2* 7.2* 7.0* 6.8*  CREATININE 7.51* 8.12* 5.57* 5.92*  GFRNONAA 5* 5* 8* 7*    LIVER FUNCTION TESTS: Recent Labs    05/16/22 0309 05/17/22 0402 05/18/22 0300 05/19/22 0433 05/20/22 0501  BILITOT 0.9 0.8  --   --   --   AST 23 38  --   --   --   ALT 28 38  --   --   --   ALKPHOS 163* 260*  --   --   --   PROT 6.4* 5.8*  --   --   --   ALBUMIN 2.8* 2.6* 2.4* 2.2* 2.1*    TUMOR MARKERS: No results for input(s): "AFPTM", "CEA", "CA199", "CHROMGRNA" in the last 8760 hours.  Assessment and Plan:  Worsening renal function On dialysis now using non tunneled access Rt IJ placed by Surgery [facility] 1/16 Scheduled now for tunneled access placement vs conversion Planned at [facility] IR 1/22 Pt to be at [facility] by 830 am  via ambulance To return to [facility] after procedure Risks and benefits discussed with the patient including, but not limited to bleeding, infection, vascular injury, pneumothorax which may require chest tube placement, air embolism or even death  All of the patient's questions were answered, patient is agreeable to proceed. Consent signed and in chart.  Thank you for this interesting consult.  I greatly enjoyed meeting Helyne S Pursifull and look forward to participating in their care.  A copy of this report was sent to the requesting provider on this date.  Electronically Signed: Lavonia Drafts, PA-C 05/20/2022, 1:58 PM   I spent a total of 20 Minutes  in face to face in clinical consultation, greater than 50% of which was counseling/coordinating care for tunneled HD catheter placement

## 2022-05-20 NOTE — Progress Notes (Addendum)
PROGRESS NOTE     Sheri Cooper, is a 72 y.o. female, DOB - Nov 22, 1950, GGY:[id number]  Admit date - 05/16/2022   Admitting Physician Bernadette Hoit, DO  Outpatient Primary MD for the patient is [facility]  LOS - 4  Chief Complaint  Patient presents with   Shortness of Breath      Brief Summary:-  72 y.o. female with medical history significant of COPD, rheumatoid arthritis, stage V CKD  Admitted on 05/16/2022 with healthcare associated pneumonia and worsening renal function with anion gap metabolic acidosis -Patient was recently discharged from [facility] on 05/12/2022 after treatment for COPD.  Pt was started on hemodialysis on 05/18/22 after temporary central line placement and had renal biopsy done on 05/19/22.     A/p HCAP--- recent hospitalization at [facility] as outlined above -COVID, flu and RSV negative -Leukocytosis and elevated procalcitonin noted WBC 15.3 >> 19.8 >>20.2 -completed last dose cefepime 05/20/21 -Bronchodilators and mucolytics as ordered   Social/Ethics--plan of care and advanced directive discussed with patient and patient's brother Trilby Drummer -Patient is a full code   AKI on CKD 5--very poor urine output, significant metabolic acidosis noted, becoming more sleepy -Review of records in [place] shows that her current renal function is close to recent baseline -Anion gap metabolic acidosis noted - patient sees Dr. Theador Hawthorne as outpatient---discussed with Dr. Lars Masson is concerned that patient may need hemodialysis this admission -Care Everywhere [facility] reviewed -Right IJ HD catheter placed by Dr. Okey Dupre; IR for [facility] being arranged by nephrology -Initiated on hemodialysis on 05/18/2022 -completed IR renal biopsy on 05/19/2022 at [facility] -- renally adjust medications, avoid nephrotoxic agents / dehydration  / hypotension   COPD -continue bronchodilators -Pt is on steroids per nephrology for AKI    Anemia in CKD -Ferritin and serum iron are not low -Hg down to 6.3, transfuse 1 unit PRBC with HD today  -recheck Hg in AM   Guaiac Positive Stools -large bloody BM noted by RN guaiac positive -DC subcutaneous heparin -transfusing PRBC with HD and stabilizing  -will ask GI team to eval 1/19 -continue protonix 40 mg daily    Incidental finding of multiple renal lesions CT angiography of chest showed multiple renal lesions bilaterally, incompletely characterized on today's noncontrast CT examination, including an indeterminate 1.9 cm lesion in the anterior aspect of the interpolar region of the left kidney.  Follow-up nonemergent outpatient abdominal MRI with and without IV gadolinium is recommended in the near future to definitively evaluate this lesion and exclude neoplasm   Elevated BNP--echo from 05/10/2022 at [facility] available in [place] reveals-  The left ventricle is normal in size with normal wall thickness. The left ventricular systolic function is normal, LVEF is visually estimated at 60-65%.  There is normal left ventricular diastolic function.  -There is mild to moderate pulmonary hypertension no significant valvular abnormalities -hemodialysis to manage volume status   Loose Stools -x 3 days.... Recent hospitalizations at [facility] and recent exposure to antibiotics - stool for C. difficile and GI pathogen--negative - diarrhea resolved   Procedures:- -Right IJ HD catheter placed on 05/18/22 -Initiated on hemodialysis on 05/18/2022 -completed IR renal biopsy on 05/19/2022 at [facility]  Status is: Inpatient   Disposition: The patient is from: Home              Anticipated d/c is to: Home Vs SNF--clipped to an outpatient HD center  Anticipated d/c date is: 2 days              Patient currently is not medically stable to d/c. Barriers: Not Clinically Stable-  Code Status :  -  Code Status: Full Code   Family Communication:    NA (patient is alert,  awake and coherent)   DVT Prophylaxis  :   - SCDs   SCDs Start: 05/16/22 0644   Lab Results  Component Value Date   PLT 129 (L) 05/20/2022   Inpatient Medications  Scheduled Meds:  sodium chloride   Intravenous Once   amLODipine  10 mg Oral Daily   calcium acetate  667 mg Oral TID WC   Chlorhexidine Gluconate Cloth  6 each Topical Daily   Chlorhexidine Gluconate Cloth  6 each Topical Q0600   darbepoetin (ARANESP) injection - DIALYSIS  150 mcg Subcutaneous Q Tue-1800   dextromethorphan-guaiFENesin  1 tablet Oral BID   feeding supplement  237 mL Oral BID BM   hydrALAZINE  50 mg Oral TID   pantoprazole  40 mg Oral Daily   sodium bicarbonate  1,300 mg Oral BID   Continuous Infusions:   PRN Meds:.acetaminophen **OR** acetaminophen, alteplase, heparin, ipratropium-albuterol, labetalol, loperamide, oxyCODONE, prochlorperazine   Anti-infectives (From admission, onward)    Start     Dose/Rate Route Frequency Ordered Stop   05/17/22 0800  ceFEPIme (MAXIPIME) 1 g in sodium chloride 0.9 % 100 mL IVPB  Status:  Discontinued        1 g 200 mL/hr over 30 Minutes Intravenous Every 24 hours 05/16/22 0641 05/20/22 1012   05/16/22 0641  vancomycin variable dose per unstable renal function (pharmacist dosing)  Status:  Discontinued         Does not apply See admin instructions 05/16/22 0641 05/17/22 1605   05/16/22 0445  vancomycin (VANCOREADY) IVPB 1250 mg/250 mL        1,250 mg 166.7 mL/hr over 90 Minutes Intravenous  Once 05/16/22 0444 05/16/22 0732   05/16/22 0430  ceFEPIme (MAXIPIME) 2 g in sodium chloride 0.9 % 100 mL IVPB        2 g 200 mL/hr over 30 Minutes Intravenous  Once 05/16/22 0426 05/16/22 0534       Subjective: Sheri Cooper reports feeling tired and weak today, no CP, no SOB.  Tolerated renal biopsy yesterday.     Objective: Vitals:   05/20/22 1130 05/20/22 1200 05/20/22 1230 05/20/22 1245  BP:  (!) 153/47 (!) 152/45 (!) 149/37  Pulse: 78 71 69 66  Resp: (!)  8 (!) '9 10 10  '$ Temp:   97.8 F (36.6 C)   TempSrc:   Oral   SpO2: 98% 98% 98% 99%  Weight:   50.9 kg   Height:        Intake/Output Summary (Last 24 hours) at 05/20/2022 1258 Last data filed at 05/20/2022 0942 Gross per 24 hour  Intake 264.68 ml  Output --  Net 264.68 ml   Filed Weights   05/19/22 0500 05/20/22 0500 05/20/22 1230  Weight: 53 kg 50.7 kg 50.9 kg    Physical Exam  Gen:-somnolent but arousable, in no apparent distress  HEENT: NCAT; pERRL Neck-Supple Neck, right IJ hemodialysis catheter in place;  Lungs-BBS fairly clear  CV- normal s1, s2 sounds;   Abd-  normal BS, ND/NT, no HSM   Extremity/Skin:-  1+ edema,  normal pedal pulses present  Psych-flat affect today Neuro-generalized weakness no new focal deficits, no tremors  Data Reviewed:  I have personally reviewed following labs and imaging studies  CBC: Recent Labs  Lab 05/16/22 0309 05/17/22 0757 05/18/22 0300 05/19/22 0433 05/20/22 0501  WBC 15.3* 19.8* 20.2* 8.4 13.1*  NEUTROABS 12.6*  --   --   --   --   HGB 10.1* 9.1* 8.5* 7.1* 6.3*  HCT 30.6* 28.2* 26.2* 21.3* 18.7*  MCV 79.1* 82.0 81.4 78.0* 77.6*  PLT 254 200 157 135* 865*   Basic Metabolic Panel: Recent Labs  Lab 05/16/22 0309 05/16/22 0711 05/17/22 0402 05/18/22 0300 05/19/22 0433 05/20/22 0501  NA 141  --  141 142 139 138  K 3.8  --  4.2 3.9 3.4* 3.2*  CL 114*  --  112* 112* 105 106  CO2 11*  --  9* 8* 17* 15*  GLUCOSE 86  --  62* 57* 100* 141*  BUN 75*  --  108* 118* 85* 113*  CREATININE 7.50*  --  7.51* 8.12* 5.57* 5.92*  CALCIUM 7.4*  --  7.2* 7.2* 7.0* 6.8*  MG  --  2.6*  --   --   --  2.2  PHOS  --  11.9*  --  12.8* 8.4* 8.8*   GFR: Estimated Creatinine Clearance: 5.9 mL/min (A) (by C-G formula based on SCr of 5.92 mg/dL (H)). Liver Function Tests: Recent Labs  Lab 05/16/22 0309 05/17/22 0402 05/18/22 0300 05/19/22 0433 05/20/22 0501  AST 23 38  --   --   --   ALT 28 38  --   --   --   ALKPHOS 163* 260*  --    --   --   BILITOT 0.9 0.8  --   --   --   PROT 6.4* 5.8*  --   --   --   ALBUMIN 2.8* 2.6* 2.4* 2.2* 2.1*   Recent Results (from the past 240 hour(s))  Resp panel by RT-PCR (RSV, Flu A&B, Covid) Anterior Nasal Swab     Status: None   Collection Time: 05/16/22  3:33 AM   Specimen: Anterior Nasal Swab  Result Value Ref Range Status   SARS Coronavirus 2 by RT PCR NEGATIVE NEGATIVE Final    Comment: (NOTE) SARS-CoV-2 target nucleic acids are NOT DETECTED.  The SARS-CoV-2 RNA is generally detectable in upper respiratory specimens during the acute phase of infection. The lowest concentration of SARS-CoV-2 viral copies this assay can detect is 138 copies/mL. A negative result does not preclude SARS-Cov-2 infection and should not be used as the sole basis for treatment or other patient management decisions. A negative result may occur with  improper specimen collection/handling, submission of specimen other than nasopharyngeal swab, presence of viral mutation(s) within the areas targeted by this assay, and inadequate number of viral copies(<138 copies/mL). A negative result must be combined with clinical observations, patient history, and epidemiological information. The expected result is Negative.  Fact Sheet for Patients:  EntrepreneurPulse.com.au  Fact Sheet for Healthcare Providers:  IncredibleEmployment.be  This test is no t yet approved or cleared by the [place] FDA and  has been authorized for detection and/or diagnosis of SARS-CoV-2 by FDA under an Emergency Use Authorization (EUA). This EUA will remain  in effect (meaning this test can be used) for the duration of the COVID-19 declaration under Section 564(b)(1) of the Act, 21 U.S.C.section 360bbb-3(b)(1), unless the authorization is terminated  or revoked sooner.       Influenza A by PCR NEGATIVE NEGATIVE Final   Influenza B by PCR NEGATIVE NEGATIVE  Final    Comment: (NOTE) The  Xpert Xpress SARS-CoV-2/FLU/RSV plus assay is intended as an aid in the diagnosis of influenza from Nasopharyngeal swab specimens and should not be used as a sole basis for treatment. Nasal washings and aspirates are unacceptable for Xpert Xpress SARS-CoV-2/FLU/RSV testing.  Fact Sheet for Patients: EntrepreneurPulse.com.au  Fact Sheet for Healthcare Providers: IncredibleEmployment.be  This test is not yet approved or cleared by the [place] FDA and has been authorized for detection and/or diagnosis of SARS-CoV-2 by FDA under an Emergency Use Authorization (EUA). This EUA will remain in effect (meaning this test can be used) for the duration of the COVID-19 declaration under Section 564(b)(1) of the Act, 21 U.S.C. section 360bbb-3(b)(1), unless the authorization is terminated or revoked.     Resp Syncytial Virus by PCR NEGATIVE NEGATIVE Final    Comment: (NOTE) Fact Sheet for Patients: EntrepreneurPulse.com.au  Fact Sheet for Healthcare Providers: IncredibleEmployment.be  This test is not yet approved or cleared by the [place] FDA and has been authorized for detection and/or diagnosis of SARS-CoV-2 by FDA under an Emergency Use Authorization (EUA). This EUA will remain in effect (meaning this test can be used) for the duration of the COVID-19 declaration under Section 564(b)(1) of the Act, 21 U.S.C. section 360bbb-3(b)(1), unless the authorization is terminated or revoked.  Performed at [facility], [address] [place], [place] [address]   Culture, blood (routine x 2) Call MD if unable to obtain prior to antibiotics being given     Status: None (Preliminary result)   Collection Time: 05/16/22  6:44 AM   Specimen: BLOOD RIGHT FOREARM  Result Value Ref Range Status   Specimen Description   Final    BLOOD RIGHT FOREARM BOTTLES DRAWN AEROBIC AND ANAEROBIC   Special Requests Blood Culture  adequate volume  Final   Culture   Final    NO GROWTH 4 DAYS Performed at [facility], [address], [place], [place] [address]    Report Status PENDING  Incomplete  Culture, blood (routine x 2) Call MD if unable to obtain prior to antibiotics being given     Status: None (Preliminary result)   Collection Time: 05/16/22  7:11 AM   Specimen: BLOOD LEFT ARM  Result Value Ref Range Status   Specimen Description BLOOD LEFT ARM BOTTLES DRAWN AEROBIC AND ANAEROBIC  Final   Special Requests Blood Culture adequate volume  Final   Culture   Final    NO GROWTH 4 DAYS Performed at [facility], [address] [place], [place] [address]    Report Status PENDING  Incomplete  MRSA Next Gen by PCR, Nasal     Status: None   Collection Time: 05/16/22 12:10 PM   Specimen: Nasal Mucosa; Nasal Swab  Result Value Ref Range Status   MRSA by PCR Next Gen NOT DETECTED NOT DETECTED Final    Comment: (NOTE) The GeneXpert MRSA Assay (FDA approved for NASAL specimens only), is one component of a comprehensive MRSA colonization surveillance program. It is not intended to diagnose MRSA infection nor to guide or monitor treatment for MRSA infections. Test performance is not FDA approved in patients less than 74 years old. Performed at [facility], [address], [place], [place] [address]   C Difficile Quick Screen w PCR reflex     Status: None   Collection Time: 05/16/22 12:10 PM   Specimen: STOOL  Result Value Ref Range Status   C Diff antigen NEGATIVE NEGATIVE Final  C Diff toxin NEGATIVE NEGATIVE Final   C Diff interpretation No C. difficile detected.  Final    Comment: Performed at [facility], [address] [place], [place] [address]  Stool culture     Status: None (Preliminary result)   Collection Time: 05/16/22 12:10 PM   Specimen: Stool  Result Value Ref Range Status   Salmonella/Shigella Screen Final report  Final   Campylobacter Culture PENDING  Incomplete   E coli, Shiga toxin Assay  Negative Negative Final    Comment: (NOTE) Performed At: [facility] [address] [place], [place] [id number] Rush Farmer MD QI:[id number]   Gastrointestinal Panel by PCR , Stool     Status: None   Collection Time: 05/16/22 12:10 PM   Specimen: Stool  Result Value Ref Range Status   Campylobacter species NOT DETECTED NOT DETECTED Final   Plesimonas shigelloides NOT DETECTED NOT DETECTED Final   Salmonella species NOT DETECTED NOT DETECTED Final   Yersinia enterocolitica NOT DETECTED NOT DETECTED Final   Vibrio species NOT DETECTED NOT DETECTED Final   Vibrio cholerae NOT DETECTED NOT DETECTED Final   Enteroaggregative E coli (EAEC) NOT DETECTED NOT DETECTED Final   Enteropathogenic E coli (EPEC) NOT DETECTED NOT DETECTED Final   Enterotoxigenic E coli (ETEC) NOT DETECTED NOT DETECTED Final   Shiga like toxin producing E coli (STEC) NOT DETECTED NOT DETECTED Final   Shigella/Enteroinvasive E coli (EIEC) NOT DETECTED NOT DETECTED Final   Cryptosporidium NOT DETECTED NOT DETECTED Final   Cyclospora cayetanensis NOT DETECTED NOT DETECTED Final   Entamoeba histolytica NOT DETECTED NOT DETECTED Final   Giardia lamblia NOT DETECTED NOT DETECTED Final   Adenovirus F40/41 NOT DETECTED NOT DETECTED Final   Astrovirus NOT DETECTED NOT DETECTED Final   Norovirus GI/GII NOT DETECTED NOT DETECTED Final   Rotavirus A NOT DETECTED NOT DETECTED Final   Sapovirus (I, II, IV, and V) NOT DETECTED NOT DETECTED Final    Comment: Performed at [facility], [address] [place], [place] [address]  STOOL CULTURE REFLEX - RSASHR     Status: None   Collection Time: 05/16/22 12:10 PM  Result Value Ref Range Status   Stool Culture result 1 (RSASHR) Comment  Final    Comment: (NOTE) No Salmonella or Shigella recovered. Performed At: [facility] [place], [place] [id number] Rush Farmer MD ZY:[id number]      Radiology Studies: US BIOPSY  (KIDNEY)  Result Date: 05/19/2022 INDICATION: 72 year old female with history of end-stage renal disease. EXAM: ULTRASOUND GUIDED RENAL BIOPSY COMPARISON:  None Available. MEDICATIONS: None. ANESTHESIA/SEDATION: Fentanyl 25 mcg IV; Versed 0.5 mg IV Total Moderate Sedation time: 10 minutes; The patient was continuously monitored during the procedure by the interventional radiology nurse under my direct supervision. COMPLICATIONS: None immediate. PROCEDURE: Informed written consent was obtained from the patient after a discussion of the risks, benefits and alternatives to treatment. The patient understands and consents the procedure. A timeout was performed prior to the initiation of the procedure. Ultrasound scanning was performed of the bilateral flanks. The inferior pole of the left kidney was selected for biopsy due to location and sonographic window. The procedure was planned. The operative site was prepped and draped in the usual sterile fashion. The overlying soft tissues were anesthetized with 1% lidocaine with epinephrine. A 17 gauge coaxial introducer needle was advanced into the inferior cortex of the left kidney and 2 core biopsies were obtained under direct ultrasound guidance with a 16 gauge core biopsy  device. Images were saved for documentation purposes. The biopsy device was removed and hemostasis was obtained with manual compression after injection of Gel-Foam slurry along the needle track under ultrasound guidance. Post procedural scanning was negative for significant post procedural hemorrhage or additional complication. A dressing was placed. The patient tolerated the procedure well without immediate post procedural complication. IMPRESSION: Technically successful ultrasound guided left renal biopsy. Ruthann Cancer, MD [facility] Radiology Electronically Signed   By: Ruthann Cancer M.D.   On: 05/19/2022 14:19   DG Chest Port 1 View  Result Date:  05/18/2022 CLINICAL DATA:  Central line placement. EXAM: PORTABLE CHEST 1 VIEW COMPARISON:  CT chest and chest x-ray from 2 days ago. FINDINGS: New right internal jugular central venous catheter with tip at the cavoatrial junction. The heart size and mediastinal contours are within normal limits. Mild diffuse interstitial thickening similar. Decreasing small right and unchanged small left pleural effusions. Increasing asymmetric density in the peripheral left upper lobe. Similar atelectasis in the peripheral right middle lobe. Improving right lower lobe atelectasis. No pneumothorax. No acute osseous abnormality. IMPRESSION: 1. New right internal jugular central venous catheter with tip at the cavoatrial junction. No pneumothorax. 2. Similar diffuse interstitial thickening with increasing asymmetric density in the peripheral left upper lobe. Differential considerations include infection or pulmonary edema. 3. Decreased small right and unchanged small left pleural effusions. Electronically Signed   By: Titus Dubin M.D.   On: 05/18/2022 14:06    Scheduled Meds:  sodium chloride   Intravenous Once   amLODipine  10 mg Oral Daily   calcium acetate  667 mg Oral TID WC   Chlorhexidine Gluconate Cloth  6 each Topical Daily   Chlorhexidine Gluconate Cloth  6 each Topical Q0600   darbepoetin (ARANESP) injection - DIALYSIS  150 mcg Subcutaneous Q Tue-1800   dextromethorphan-guaiFENesin  1 tablet Oral BID   feeding supplement  237 mL Oral BID BM   hydrALAZINE  50 mg Oral TID   pantoprazole  40 mg Oral Daily   sodium bicarbonate  1,300 mg Oral BID   Continuous Infusions:    LOS: 4 days   Irwin Brakeman M.D on 05/20/2022 at 12:58 PM  Go to [email or web address] - for contact info  [facility] - Office  [phone]  If 7PM-7AM, please contact night-coverage [email or web address] 05/20/2022, 12:58 PM

## 2022-05-20 NOTE — Progress Notes (Signed)
Per nephro note pt on a lasix challenge. Pt has gotten a total of '320mg'$  IV lasix in 48 hours no uop was documented for day shift 05/19/22 and she hasn't voided this shift. Bladder scan has only 217m Lasix may need to be d/c'd since there does not seem to be a meaningful response.   Total measured uop since admission 3959m

## 2022-05-21 ENCOUNTER — Inpatient Hospital Stay (HOSPITAL_COMMUNITY): Payer: Medicare Other

## 2022-05-21 DIAGNOSIS — D62 Acute posthemorrhagic anemia: Secondary | ICD-10-CM | POA: Diagnosis not present

## 2022-05-21 DIAGNOSIS — N185 Chronic kidney disease, stage 5: Secondary | ICD-10-CM

## 2022-05-21 DIAGNOSIS — J189 Pneumonia, unspecified organism: Secondary | ICD-10-CM | POA: Diagnosis not present

## 2022-05-21 LAB — CBC
HCT: 17.5 % — ABNORMAL LOW (ref 36.0–46.0)
Hemoglobin: 6.1 g/dL — CL (ref 12.0–15.0)
MCH: 27.5 pg (ref 26.0–34.0)
MCHC: 34.9 g/dL (ref 30.0–36.0)
MCV: 78.8 fL — ABNORMAL LOW (ref 80.0–100.0)
Platelets: 109 10*3/uL — ABNORMAL LOW (ref 150–400)
RBC: 2.22 MIL/uL — ABNORMAL LOW (ref 3.87–5.11)
RDW: 16.7 % — ABNORMAL HIGH (ref 11.5–15.5)
WBC: 15.4 10*3/uL — ABNORMAL HIGH (ref 4.0–10.5)
nRBC: 0.6 % — ABNORMAL HIGH (ref 0.0–0.2)

## 2022-05-21 LAB — RENAL FUNCTION PANEL
Albumin: 1.9 g/dL — ABNORMAL LOW (ref 3.5–5.0)
Anion gap: 12 (ref 5–15)
BUN: 78 mg/dL — ABNORMAL HIGH (ref 8–23)
CO2: 22 mmol/L (ref 22–32)
Calcium: 6.8 mg/dL — ABNORMAL LOW (ref 8.9–10.3)
Chloride: 101 mmol/L (ref 98–111)
Creatinine, Ser: 4.03 mg/dL — ABNORMAL HIGH (ref 0.44–1.00)
GFR, Estimated: 11 mL/min — ABNORMAL LOW (ref >60)
Glucose, Bld: 120 mg/dL — ABNORMAL HIGH (ref 70–99)
Phosphorus: 5.2 mg/dL — ABNORMAL HIGH (ref 2.5–4.6)
Potassium: 3 mmol/L — ABNORMAL LOW (ref 3.5–5.1)
Sodium: 135 mmol/L (ref 135–145)

## 2022-05-21 LAB — CULTURE, BLOOD (ROUTINE X 2)
Culture: NO GROWTH
Culture: NO GROWTH
Special Requests: ADEQUATE
Special Requests: ADEQUATE

## 2022-05-21 LAB — PROTEIN ELECTROPHORESIS, SERUM
A/G Ratio: 1.2 (ref 0.7–1.7)
Albumin ELP: 2.4 g/dL — ABNORMAL LOW (ref 2.9–4.4)
Alpha-1-Globulin: 0.3 g/dL (ref 0.0–0.4)
Alpha-2-Globulin: 0.6 g/dL (ref 0.4–1.0)
Beta Globulin: 0.8 g/dL (ref 0.7–1.3)
Gamma Globulin: 0.4 g/dL (ref 0.4–1.8)
Globulin, Total: 2 g/dL — ABNORMAL LOW (ref 2.2–3.9)
Total Protein ELP: 4.4 g/dL — ABNORMAL LOW (ref 6.0–8.5)

## 2022-05-21 LAB — PROCALCITONIN: Procalcitonin: 1.45 ng/mL

## 2022-05-21 LAB — HEMOGLOBIN AND HEMATOCRIT, BLOOD
HCT: 29.8 % — ABNORMAL LOW (ref 36.0–46.0)
Hemoglobin: 10.5 g/dL — ABNORMAL LOW (ref 12.0–15.0)

## 2022-05-21 LAB — PREPARE RBC (CROSSMATCH)

## 2022-05-21 MED ORDER — PEG 3350-KCL-NA BICARB-NACL 420 G PO SOLR
4000.0000 mL | Freq: Once | ORAL | Status: AC
  Administered 2022-05-21: 4000 mL via ORAL

## 2022-05-21 MED ORDER — SODIUM CHLORIDE 0.9% IV SOLUTION: Freq: Once | INTRAVENOUS | Status: DC

## 2022-05-21 MED ORDER — BISACODYL 5 MG PO TBEC
10.0000 mg | DELAYED_RELEASE_TABLET | Freq: Once | ORAL | Status: AC
  Administered 2022-05-21: 10 mg via ORAL
  Filled 2022-05-21: qty 2

## 2022-05-21 MED ORDER — PREDNISONE 20 MG PO TABS
60.0000 mg | ORAL_TABLET | Freq: Every day | ORAL | Status: DC
  Administered 2022-05-23 – 2022-05-27 (×3): 60 mg via ORAL
  Filled 2022-05-21 (×5): qty 3

## 2022-05-21 NOTE — Progress Notes (Addendum)
PROGRESS NOTE   Sheri Cooper, is a 72 y.o. female, DOB - April 21, 1951, LXB:[id number]  Admit date - 05/16/2022   Admitting Physician Bernadette Hoit, DO  Outpatient Primary MD for the patient is [facility]  LOS - 5  Chief Complaint  Patient presents with   Shortness of Breath      Brief Summary:-  72 y.o. female with medical history significant of COPD, rheumatoid arthritis, stage V CKD  Admitted on 05/16/2022 with healthcare associated pneumonia and worsening renal function with anion gap metabolic acidosis -Patient was recently discharged from [facility] on 05/12/2022 after treatment for COPD.  Pt was started on hemodialysis on 05/18/22 after temporary central line placement and had renal biopsy done on 05/19/22.     A/p HCAP--- recent hospitalization at [facility] as outlined above -COVID, flu and RSV negative -Leukocytosis and elevated procalcitonin noted WBC 15.3 >> 19.8 >>20.2 -completed last dose cefepime 05/20/21 -Bronchodilators and mucolytics as ordered   Social/Ethics--plan of care and advanced directive discussed with patient and patient's brother Trilby Drummer -Patient is a full code   AKI on CKD 5--very poor urine output, significant metabolic acidosis noted, becoming more sleepy - now transitioned to hemodialysis  -Review of records in [place] shows that her current renal function is close to recent baseline -Anion gap metabolic acidosis noted - patient sees Dr. Theador Hawthorne as outpatient---discussed with Dr. Lars Masson was concerned that patient would need hemodialysis this admission which has now been started -Care Everywhere [facility] -Right IJ HD catheter placed by Dr. Okey Dupre; IR for [facility] being arranged by nephrology - IR planning to place [facility] on Mon 05/24/22 -Initiated on hemodialysis on 05/18/2022 -completed IR renal biopsy on 05/19/2022 at [facility] -renally adjust medications, avoid nephrotoxic agents /  dehydration  / hypotension   COPD -continue bronchodilators -Pt is on steroids per nephrology for AKI  Leukocytosis  - I think this is a leukemoid reaction from high dose steroids that patient is on - we have not found any evidence of infection at this time  - we are following CBC   Acute blood loss Anemia / Anemia in CKD -Ferritin and serum iron are not low -Hg down to 6.3, transfused 1 unit PRBC with HD on 1/18, had large bloody BM, Hg down to 6.1 1/19, transfuse 2 units PRBC with HD on 1/19  -recheck Hg in AM   Guaiac Positive Stools / GI bleeding -large bloody BM noted by RN guaiac positive -DC'd subcutaneous heparin on 1/18 -transfused 1 unit PRBC with HD on 1/18, today Hg down again to 6.1, transfuse 2 unit PRBC 1/19 with HD  -will ask GI team to eval 1/19 -continue protonix 40 mg daily  -transfuse 2 units PRBC with HD 1/19  Thrombocytopenia - we stopped [place] heparin on 1/18 - check HIT antibody labs - recheck CBC in AM  - "pharmacy on HIT alert" order placed   Incidental finding of multiple renal lesions CT angiography of chest showed multiple renal lesions bilaterally, incompletely characterized on today's noncontrast CT examination, including an indeterminate 1.9 cm lesion in the anterior aspect of the interpolar region of the left kidney.  Follow-up nonemergent outpatient abdominal MRI with and without IV gadolinium is recommended in the near future to definitively evaluate this lesion and exclude neoplasm   Elevated BNP--echo from 05/10/2022 at [facility] available in [place] reveals-  The left ventricle is normal in size with normal wall thickness. The left ventricular systolic function is normal,  LVEF is visually estimated at 60-65%.  There is normal left ventricular diastolic function.  -There is mild to moderate pulmonary hypertension no significant valvular abnormalities -hemodialysis to manage volume status   Loose Stools -x 3 days.... Recent  hospitalizations at [facility] and recent exposure to antibiotics - stool for C. difficile and GI pathogen--negative - diarrhea resolved   Procedures:- -Right IJ HD catheter placed on 05/18/22 -Initiated on hemodialysis on 05/18/2022 -completed IR renal biopsy on 05/19/2022 at [facility]  Status is: Inpatient   Disposition: The patient is from: Home              Anticipated d/c is to: Home Vs SNF--clipped to an outpatient HD center              Anticipated d/c date is: 2 days              Patient currently is not medically stable to d/c. Barriers: Not Clinically Stable-  Code Status :  -  Code Status: Full Code   Family Communication:    NA (patient is alert, awake and coherent)   DVT Prophylaxis  :   - SCDs   SCDs Start: 05/16/22 0644   Lab Results  Component Value Date   PLT 109 (L) 05/21/2022   Inpatient Medications  Scheduled Meds:  sodium chloride   Intravenous Once   amLODipine  10 mg Oral Daily   calcium acetate  667 mg Oral TID WC   Chlorhexidine Gluconate Cloth  6 each Topical Daily   Chlorhexidine Gluconate Cloth  6 each Topical Q0600   darbepoetin (ARANESP) injection - DIALYSIS  150 mcg Subcutaneous Q Tue-1800   dextromethorphan-guaiFENesin  1 tablet Oral BID   feeding supplement  237 mL Oral BID BM   hydrALAZINE  50 mg Oral TID   pantoprazole  40 mg Oral Daily   predniSONE  60 mg Oral Q breakfast   sodium bicarbonate  1,300 mg Oral BID   Continuous Infusions:  PRN Meds:.acetaminophen **OR** acetaminophen, alteplase, heparin, ipratropium-albuterol, labetalol, loperamide, oxyCODONE, prochlorperazine   Anti-infectives (From admission, onward)    Start     Dose/Rate Route Frequency Ordered Stop   05/17/22 0800  ceFEPIme (MAXIPIME) 1 g in sodium chloride 0.9 % 100 mL IVPB  Status:  Discontinued        1 g 200 mL/hr over 30 Minutes Intravenous Every 24 hours 05/16/22 0641 05/20/22 1012   05/16/22 0641  vancomycin variable dose per unstable renal function  (pharmacist dosing)  Status:  Discontinued         Does not apply See admin instructions 05/16/22 0641 05/17/22 1605   05/16/22 0445  vancomycin (VANCOREADY) IVPB 1250 mg/250 mL        1,250 mg 166.7 mL/hr over 90 Minutes Intravenous  Once 05/16/22 0444 05/16/22 0732   05/16/22 0430  ceFEPIme (MAXIPIME) 2 g in sodium chloride 0.9 % 100 mL IVPB        2 g 200 mL/hr over 30 Minutes Intravenous  Once 05/16/22 0426 05/16/22 0534       Subjective: Sheri Cooper continues to feel tired; tolerated HD yesterday with no problem, no flank pain or abdominal pain, no cough, no chest congestion, no SOB, no CP;     Objective: Vitals:   05/21/22 1000 05/21/22 1030 05/21/22 1040 05/21/22 1100  BP: (!) 174/52 (!) 151/43  (!) 138/51  Pulse: 75 77  83  Resp: 19 (!) 80    Temp:  97.6 F (36.4 C)  97.6 F (36.4 C)  TempSrc:  Oral  Oral  SpO2: 95% 97%    Weight:   53.3 kg   Height:        Intake/Output Summary (Last 24 hours) at 05/21/2022 1122 Last data filed at 05/20/2022 1528 Gross per 24 hour  Intake 315 ml  Output 600 ml  Net -285 ml   Filed Weights   05/20/22 1230 05/21/22 0500 05/21/22 1040  Weight: 50.9 kg 53.3 kg 53.3 kg    Physical Exam  Gen: awake, alert, cooperative, NAD; appears more pale today  HEENT: NCAT; pERRL Neck-Supple Neck, right IJ hemodialysis catheter in place; no sign of infection seen Lungs-BBS shallow but clear to auscultation   CV- normal s1, s2 sounds;   Abd-  normal BS, ND/NT, no HSM   Extremity/Skin:-  1+ edema,  normal pedal pulses present  Psych-flat affect unchanged Neuro-generalized weakness no new focal deficits, no tremors  Data Reviewed: I have personally reviewed following labs and imaging studies  CBC: Recent Labs  Lab 05/16/22 0309 05/17/22 0757 05/18/22 0300 05/19/22 0433 05/20/22 0501 05/20/22 2000 05/21/22 0456  WBC 15.3* 19.8* 20.2* 8.4 13.1*  --  15.4*  NEUTROABS 12.6*  --   --   --   --   --   --   HGB 10.1* 9.1* 8.5* 7.1*  6.3* 7.6* 6.1*  HCT 30.6* 28.2* 26.2* 21.3* 18.7* 21.9* 17.5*  MCV 79.1* 82.0 81.4 78.0* 77.6*  --  78.8*  PLT 254 200 157 135* 129*  --  580*   Basic Metabolic Panel: Recent Labs  Lab 05/16/22 0711 05/17/22 0402 05/18/22 0300 05/19/22 0433 05/20/22 0501 05/21/22 0456  NA  --  141 142 139 138 135  K  --  4.2 3.9 3.4* 3.2* 3.0*  CL  --  112* 112* 105 106 101  CO2  --  9* 8* 17* 15* 22  GLUCOSE  --  62* 57* 100* 141* 120*  BUN  --  108* 118* 85* 113* 78*  CREATININE  --  7.51* 8.12* 5.57* 5.92* 4.03*  CALCIUM  --  7.2* 7.2* 7.0* 6.8* 6.8*  MG 2.6*  --   --   --  2.2  --   PHOS 11.9*  --  12.8* 8.4* 8.8* 5.2*   GFR: Estimated Creatinine Clearance: 9.4 mL/min (A) (by C-G formula based on SCr of 4.03 mg/dL (H)). Liver Function Tests: Recent Labs  Lab 05/16/22 0309 05/17/22 0402 05/18/22 0300 05/19/22 0433 05/20/22 0501 05/21/22 0456  AST 23 38  --   --   --   --   ALT 28 38  --   --   --   --   ALKPHOS 163* 260*  --   --   --   --   BILITOT 0.9 0.8  --   --   --   --   PROT 6.4* 5.8*  --   --   --   --   ALBUMIN 2.8* 2.6* 2.4* 2.2* 2.1* 1.9*   Recent Results (from the past 240 hour(s))  Resp panel by RT-PCR (RSV, Flu A&B, Covid) Anterior Nasal Swab     Status: None   Collection Time: 05/16/22  3:33 AM   Specimen: Anterior Nasal Swab  Result Value Ref Range Status   SARS Coronavirus 2 by RT PCR NEGATIVE NEGATIVE Final    Comment: (NOTE) SARS-CoV-2 target nucleic acids are NOT DETECTED.  The SARS-CoV-2 RNA is generally detectable in upper respiratory specimens during the  acute phase of infection. The lowest concentration of SARS-CoV-2 viral copies this assay can detect is 138 copies/mL. A negative result does not preclude SARS-Cov-2 infection and should not be used as the sole basis for treatment or other patient management decisions. A negative result may occur with  improper specimen collection/handling, submission of specimen other than nasopharyngeal swab,  presence of viral mutation(s) within the areas targeted by this assay, and inadequate number of viral copies(<138 copies/mL). A negative result must be combined with clinical observations, patient history, and epidemiological information. The expected result is Negative.  Fact Sheet for Patients:  EntrepreneurPulse.com.au  Fact Sheet for Healthcare Providers:  IncredibleEmployment.be  This test is no t yet approved or cleared by the [place] FDA and  has been authorized for detection and/or diagnosis of SARS-CoV-2 by FDA under an Emergency Use Authorization (EUA). This EUA will remain  in effect (meaning this test can be used) for the duration of the COVID-19 declaration under Section 564(b)(1) of the Act, 21 U.S.C.section 360bbb-3(b)(1), unless the authorization is terminated  or revoked sooner.       Influenza A by PCR NEGATIVE NEGATIVE Final   Influenza B by PCR NEGATIVE NEGATIVE Final    Comment: (NOTE) The Xpert Xpress SARS-CoV-2/FLU/RSV plus assay is intended as an aid in the diagnosis of influenza from Nasopharyngeal swab specimens and should not be used as a sole basis for treatment. Nasal washings and aspirates are unacceptable for Xpert Xpress SARS-CoV-2/FLU/RSV testing.  Fact Sheet for Patients: EntrepreneurPulse.com.au  Fact Sheet for Healthcare Providers: IncredibleEmployment.be  This test is not yet approved or cleared by the [place] FDA and has been authorized for detection and/or diagnosis of SARS-CoV-2 by FDA under an Emergency Use Authorization (EUA). This EUA will remain in effect (meaning this test can be used) for the duration of the COVID-19 declaration under Section 564(b)(1) of the Act, 21 U.S.C. section 360bbb-3(b)(1), unless the authorization is terminated or revoked.     Resp Syncytial Virus by PCR NEGATIVE NEGATIVE Final    Comment: (NOTE) Fact Sheet for  Patients: EntrepreneurPulse.com.au  Fact Sheet for Healthcare Providers: IncredibleEmployment.be  This test is not yet approved or cleared by the [place] FDA and has been authorized for detection and/or diagnosis of SARS-CoV-2 by FDA under an Emergency Use Authorization (EUA). This EUA will remain in effect (meaning this test can be used) for the duration of the COVID-19 declaration under Section 564(b)(1) of the Act, 21 U.S.C. section 360bbb-3(b)(1), unless the authorization is terminated or revoked.  Performed at [facility], [address], [place], [place] [address]   Culture, blood (routine x 2) Call MD if unable to obtain prior to antibiotics being given     Status: None   Collection Time: 05/16/22  6:44 AM   Specimen: BLOOD RIGHT FOREARM  Result Value Ref Range Status   Specimen Description   Final    BLOOD RIGHT FOREARM BOTTLES DRAWN AEROBIC AND ANAEROBIC   Special Requests Blood Culture adequate volume  Final   Culture   Final    NO GROWTH 5 DAYS Performed at [facility], [address], [place], [place] [address]    Report Status 05/21/2022 FINAL  Final  Culture, blood (routine x 2) Call MD if unable to obtain prior to antibiotics being given     Status: None   Collection Time: 05/16/22  7:11 AM   Specimen: BLOOD LEFT ARM  Result Value Ref Range Status   Specimen Description BLOOD LEFT ARM BOTTLES  DRAWN AEROBIC AND ANAEROBIC  Final   Special Requests Blood Culture adequate volume  Final   Culture   Final    NO GROWTH 5 DAYS Performed at [facility], [address], [place], [place] [address]    Report Status 05/21/2022 FINAL  Final  MRSA Next Gen by PCR, Nasal     Status: None   Collection Time: 05/16/22 12:10 PM   Specimen: Nasal Mucosa; Nasal Swab  Result Value Ref Range Status   MRSA by PCR Next Gen NOT DETECTED NOT DETECTED Final    Comment: (NOTE) The GeneXpert MRSA Assay (FDA approved for NASAL specimens only), is  one component of a comprehensive MRSA colonization surveillance program. It is not intended to diagnose MRSA infection nor to guide or monitor treatment for MRSA infections. Test performance is not FDA approved in patients less than 53 years old. Performed at [facility], [address] [place], [place] [address]   C Difficile Quick Screen w PCR reflex     Status: None   Collection Time: 05/16/22 12:10 PM   Specimen: STOOL  Result Value Ref Range Status   C Diff antigen NEGATIVE NEGATIVE Final   C Diff toxin NEGATIVE NEGATIVE Final   C Diff interpretation No C. difficile detected.  Final    Comment: Performed at [facility], [address], [place], [place] [address]  Stool culture     Status: None   Collection Time: 05/16/22 12:10 PM   Specimen: Stool  Result Value Ref Range Status   Salmonella/Shigella Screen Final report  Final   Campylobacter Culture Final report  Final   E coli, Shiga toxin Assay Negative Negative Final    Comment: (NOTE) Performed At: [facility] [address] [place], [place] [id number] Rush Farmer MD WU:[id number]   Gastrointestinal Panel by PCR , Stool     Status: None   Collection Time: 05/16/22 12:10 PM   Specimen: Stool  Result Value Ref Range Status   Campylobacter species NOT DETECTED NOT DETECTED Final   Plesimonas shigelloides NOT DETECTED NOT DETECTED Final   Salmonella species NOT DETECTED NOT DETECTED Final   Yersinia enterocolitica NOT DETECTED NOT DETECTED Final   Vibrio species NOT DETECTED NOT DETECTED Final   Vibrio cholerae NOT DETECTED NOT DETECTED Final   Enteroaggregative E coli (EAEC) NOT DETECTED NOT DETECTED Final   Enteropathogenic E coli (EPEC) NOT DETECTED NOT DETECTED Final   Enterotoxigenic E coli (ETEC) NOT DETECTED NOT DETECTED Final   Shiga like toxin producing E coli (STEC) NOT DETECTED NOT DETECTED Final   Shigella/Enteroinvasive E coli (EIEC) NOT DETECTED NOT DETECTED Final   Cryptosporidium NOT DETECTED  NOT DETECTED Final   Cyclospora cayetanensis NOT DETECTED NOT DETECTED Final   Entamoeba histolytica NOT DETECTED NOT DETECTED Final   Giardia lamblia NOT DETECTED NOT DETECTED Final   Adenovirus F40/41 NOT DETECTED NOT DETECTED Final   Astrovirus NOT DETECTED NOT DETECTED Final   Norovirus GI/GII NOT DETECTED NOT DETECTED Final   Rotavirus A NOT DETECTED NOT DETECTED Final   Sapovirus (I, II, IV, and V) NOT DETECTED NOT DETECTED Final    Comment: Performed at [facility], [address] [place],  [address]  STOOL CULTURE REFLEX - RSASHR     Status: None   Collection Time: 05/16/22 12:10 PM  Result Value Ref Range Status   Stool Culture result 1 (RSASHR) Comment  Final    Comment: (NOTE) No Salmonella or Shigella recovered. Performed At: [place] [facility] [place]  [place], [place] [id number] Rush Farmer MD RA:[id number]   STOOL CULTURE Reflex - CMPCXR     Status: None   Collection Time: 05/16/22 12:10 PM  Result Value Ref Range Status   Stool Culture result 1 (CMPCXR) Comment  Final    Comment: (NOTE) No Campylobacter species isolated. Performed At: [facility] [place], [place] [id number] Rush Farmer MD HT:[id number]      Radiology Studies: DG CHEST PORT 1 VIEW  Result Date: 05/21/2022 CLINICAL DATA:  Leukocytosis EXAM: PORTABLE CHEST 1 VIEW COMPARISON:  05/18/2022 FINDINGS: Central venous line tip in distal SVC. Normal cardiac silhouette. Small RIGHT effusion. Mild venous congestion. No pneumothorax. No focal consolidation. Lymphadenectomy clips in the LEFT axilla. No significant interval change. IMPRESSION: 1. No significant interval change. 2. Small RIGHT effusion. 3. Mild venous congestion. Electronically Signed   By: Suzy Bouchard M.D.   On: 05/21/2022 08:22   US BIOPSY (KIDNEY)  Result Date: 05/19/2022 INDICATION: 72 year old female with history of end-stage renal disease. EXAM: ULTRASOUND GUIDED RENAL BIOPSY  COMPARISON:  None Available. MEDICATIONS: None. ANESTHESIA/SEDATION: Fentanyl 25 mcg IV; Versed 0.5 mg IV Total Moderate Sedation time: 10 minutes; The patient was continuously monitored during the procedure by the interventional radiology nurse under my direct supervision. COMPLICATIONS: None immediate. PROCEDURE: Informed written consent was obtained from the patient after a discussion of the risks, benefits and alternatives to treatment. The patient understands and consents the procedure. A timeout was performed prior to the initiation of the procedure. Ultrasound scanning was performed of the bilateral flanks. The inferior pole of the left kidney was selected for biopsy due to location and sonographic window. The procedure was planned. The operative site was prepped and draped in the usual sterile fashion. The overlying soft tissues were anesthetized with 1% lidocaine with epinephrine. A 17 gauge coaxial introducer needle was advanced into the inferior cortex of the left kidney and 2 core biopsies were obtained under direct ultrasound guidance with a 16 gauge core biopsy device. Images were saved for documentation purposes. The biopsy device was removed and hemostasis was obtained with manual compression after injection of Gel-Foam slurry along the needle track under ultrasound guidance. Post procedural scanning was negative for significant post procedural hemorrhage or additional complication. A dressing was placed. The patient tolerated the procedure well without immediate post procedural complication. IMPRESSION: Technically successful ultrasound guided left renal biopsy. Ruthann Cancer, MD [facility] Electronically Signed   By: Ruthann Cancer M.D.   On: 05/19/2022 14:19    Scheduled Meds:  sodium chloride   Intravenous Once   amLODipine  10 mg Oral Daily   calcium acetate  667 mg Oral TID WC   Chlorhexidine Gluconate Cloth  6 each Topical Daily    Chlorhexidine Gluconate Cloth  6 each Topical Q0600   darbepoetin (ARANESP) injection - DIALYSIS  150 mcg Subcutaneous Q Tue-1800   dextromethorphan-guaiFENesin  1 tablet Oral BID   feeding supplement  237 mL Oral BID BM   hydrALAZINE  50 mg Oral TID   pantoprazole  40 mg Oral Daily   predniSONE  60 mg Oral Q breakfast   sodium bicarbonate  1,300 mg Oral BID   Continuous Infusions:  Critical Care Procedure Note Authorized and Performed by: Murvin Natal MD  Total Critical Care time:  50 mins Due to a high probability of clinically significant, life threatening deterioration, the patient required my highest level of preparedness to intervene emergently and I  personally spent this critical care time directly and personally managing the patient.  This critical care time included obtaining a history; examining the patient, pulse oximetry; ordering and review of studies; arranging urgent treatment with development of a management plan; evaluation of patient's response of treatment; frequent reassessment; and discussions with other providers.  This critical care time was performed to assess and manage the high probability of imminent and life threatening deterioration that could result in multi-organ failure.  It was exclusive of separately billable procedures and treating other patients and teaching time.     LOS: 5 days   Irwin Brakeman M.D on 05/21/2022 at 11:22 AM  Go to [email or web address] - for contact info  [facility] - Office  [phone]  If 7PM-7AM, please contact night-coverage [email or web address] 05/21/2022, 11:22 AM

## 2022-05-21 NOTE — Consult Note (Signed)
Gastroenterology Consult   Referring Provider: No ref. provider found Primary Care Physician:  [facility] Primary Gastroenterologist:  Harvel Quale, MD; previously unassigned  Patient ID: Sheri Cooper; [id number]; May 25, 1950   Admit date: 05/16/2022  LOS: 5 days   Date of Consultation: 05/21/2022  Reason for Consultation:  rectal bleeding, anemia  History of Present Illness   Sheri Cooper is a 72 y.o. year old female with history of COPD, RA, CKD stage 4 who was admitted with pneumonia and worsening renal function on 05/16/22 after recent discharge from OSH on 05/12/22. GI consulted for further evaluation of worsening anemia in the setting of rectal bleeding that began yesterday.    No prior colonoscopy on file.   Has transport set up to receive tunneled dialysis catheter placement on Monday 05/24/22.   Presented to the hospital with anemia, Hgb 10.1. Iron panel normal with elevated iron saturation and normal ferritin. Has had steady decline in Hgb since admission and had large bloody BM yesterday 1/18 and Hgb 6.3, received 1u PRBC. Overnight 1/18 two bloody BM reported by nursing.  Patient denies any abdominal pain, constipation, diarrhea, nausea/vomiting.  Denies any other upper GI symptoms.  Denies being on any blood thinners and denies any chronic NSAID use.  States her last colonoscopy was around about 5 years ago and was performed in [place].  States at some point around this time she also had surgery for twisted colon, unable to provide any further details.  She denies any previous melena or hematochezia at home.   Per nursing patient had 1 very large bright red bloody bowel movement yesterday afternoon it was reported to her this morning that she had to smaller bright red stools overnight.  She does endorse some fatigue however states this has been present for a while given her recent worsening kidney function and her pneumonia.  Has been  tolerating dialysis without any issue and states her plan is to still get permanent dialysis catheter placement on Monday.    Past Medical History:  Diagnosis Date   Chronic kidney disease (CKD), stage IV (severe) (HCC)    COPD (chronic obstructive pulmonary disease) (HCC)    Rheumatoid arthritis ([place])     History reviewed. No pertinent surgical history.  Prior to Admission medications   Medication Sig Start Date End Date Taking? Authorizing Provider  ABRYSVO 120 MCG/0.5ML injection Inject 0.5 mLs into the muscle once. 01/18/22  Yes [provider]  amLODipine (NORVASC) 10 MG tablet Take 10 mg by mouth daily.   Yes [provider]  carvedilol (COREG) 25 MG tablet Take 25 mg by mouth 2 (two) times daily with a meal. 04/17/22  Yes [provider]  hydrALAZINE (APRESOLINE) 100 MG tablet Take 100 mg by mouth 2 (two) times daily. 04/17/22  Yes [provider]  hydrochlorothiazide (HYDRODIURIL) 25 MG tablet Take 25 mg by mouth daily. 01/06/22  Yes [provider]  rosuvastatin (CRESTOR) 20 MG tablet Take 20 mg by mouth daily.   Yes [provider]  sertraline (ZOLOFT) 50 MG tablet Take 1 tablet by mouth daily. 05/12/22 06/11/22 Yes [provider]  Adalimumab 20 MG/0.4ML PSKT Inject into the skin.    [provider]  metoprolol succinate (TOPROL-XL) 50 MG 24 hr tablet Take 50 mg by mouth daily. Patient not taking: Reported on 05/17/2022 03/31/22   [provider]    Current Facility-Administered Medications  Medication Dose Route Frequency Provider Last Rate Last Admin   0.9 %  sodium chloride infusion (Manually program via Guardrails IV Fluids)   Intravenous Once Murlean Iba, MD   Held at 05/21/22 7106   acetaminophen (TYLENOL) tablet 650 mg  650 mg Oral Q6H PRN Adefeso, Oladapo, DO       Or   acetaminophen (TYLENOL) suppository 650 mg  650 mg Rectal Q6H PRN Adefeso, Oladapo, DO       alteplase (CATHFLO  ACTIVASE) injection 2 mg  2 mg Intracatheter Once PRN Corliss Parish, MD       amLODipine (NORVASC) tablet 10 mg  10 mg Oral Daily Emokpae, Courage, MD   10 mg at 05/21/22 0842   calcium acetate (PHOSLO) capsule 667 mg  667 mg Oral TID WC Gean Quint, MD   667 mg at 05/21/22 0842   Chlorhexidine Gluconate Cloth 2 % PADS 6 each  6 each Topical Daily Roxan Hockey, MD   6 each at 05/21/22 0843   Chlorhexidine Gluconate Cloth 2 % PADS 6 each  6 each Topical Q0600 Corliss Parish, MD   6 each at 05/21/22 0547   Darbepoetin Alfa (ARANESP) injection 150 mcg  150 mcg Subcutaneous Q Tue-1800 Corliss Parish, MD   150 mcg at 05/18/22 1828   dextromethorphan-guaiFENesin (Jane Lew DM) 30-600 MG per 12 hr tablet 1 tablet  1 tablet Oral BID Adefeso, Oladapo, DO   1 tablet at 05/21/22 0841   feeding supplement (ENSURE ENLIVE / ENSURE PLUS) liquid 237 mL  237 mL Oral BID BM Adefeso, Oladapo, DO   237 mL at 05/18/22 1449   heparin injection 1,000 Units  1,000 Units Intracatheter PRN Corliss Parish, MD   2,600 Units at 05/20/22 1525   hydrALAZINE (APRESOLINE) tablet 50 mg  50 mg Oral TID Roxan Hockey, MD   50 mg at 05/21/22 0841   ipratropium-albuterol (DUONEB) 0.5-2.5 (3) MG/3ML nebulizer solution 3 mL  3 mL Nebulization Q4H PRN Adefeso, Oladapo, DO       labetalol (NORMODYNE) injection 10 mg  10 mg Intravenous Q4H PRN Denton Brick, Courage, MD   10 mg at 05/21/22 0108   loperamide (IMODIUM) capsule 2 mg  2 mg Oral Q6H PRN Roxan Hockey, MD   2 mg at 05/21/22 0545   oxyCODONE (Oxy IR/ROXICODONE) immediate release tablet 5 mg  5 mg Oral Q8H PRN Emokpae, Courage, MD   5 mg at 05/16/22 1429   pantoprazole (PROTONIX) EC tablet 40 mg  40 mg Oral Daily Corliss Parish, MD   40 mg at 05/21/22 0841   prochlorperazine (COMPAZINE) injection 10 mg  10 mg Intravenous Q6H PRN Adefeso, Oladapo, DO   10 mg at 05/18/22 2694   sodium bicarbonate tablet 1,300 mg  1,300 mg Oral BID Roxan Hockey, MD    1,300 mg at 05/21/22 0841    Allergies as of 05/16/2022   (No Known Allergies)    History reviewed. No pertinent family history.  Social History   Socioeconomic History   Marital status: Unknown    Spouse name: Not on file   Number of children: Not on file   Years of education: Not on file   Highest education level: Not on file  Occupational History   Not on file  Tobacco Use   Smoking status: Former    Types: Cigarettes   Smokeless tobacco: Never  Substance and Sexual Activity   Alcohol use: Not on file   Drug use: Never   Sexual activity: Not on file  Other Topics Concern   Not on file  Social History Narrative  Not on file   Social Determinants of Health   Financial Resource Strain: Not on file  Food Insecurity: No Food Insecurity (05/19/2022)   Hunger Vital Sign    Worried About Running Out of Food in the Last Year: Never true    Ran Out of Food in the Last Year: Never true  Transportation Needs: No Transportation Needs (05/19/2022)   PRAPARE - Hydrologist (Medical): No    Lack of Transportation (Non-Medical): No  Physical Activity: Not on file  Stress: Not on file  Social Connections: Not on file  Intimate Partner Violence: Not At Risk (05/19/2022)   Humiliation, Afraid, Rape, and Kick questionnaire    Fear of Current or Ex-Partner: No    Emotionally Abused: No    Physically Abused: No    Sexually Abused: No     Review of Systems   Gen: Denies any fever, chills, loss of appetite, change in weight or weight loss CV: Denies chest pain, heart palpitations, syncope, edema  Resp: Denies shortness of breath with rest, cough, wheezing, coughing up blood, and pleurisy. GI: see HPI GU : Denies urinary burning, blood in urine, urinary frequency, and urinary incontinence. MS: Denies joint pain, limitation of movement, swelling, cramps, and atrophy.  Derm: Denies rash, itching, dry skin, hives. Psych: Denies depression, anxiety,  memory loss, hallucinations, and confusion. Heme: Denies bruising or bleeding Neuro:  Denies any headaches, dizziness, paresthesias, shaking  Physical Exam   Vital Signs in last 24 hours: Temp:  [97.5 F (36.4 C)-97.9 F (36.6 C)] 97.6 F (36.4 C) (01/19 0400) Pulse Rate:  [55-89] 68 (01/19 0800) Resp:  [7-18] 13 (01/19 0800) BP: (95-179)/(30-56) 165/47 (01/19 0800) SpO2:  [96 %-99 %] 97 % (01/19 0800) Weight:  [50.9 kg-53.3 kg] 53.3 kg (01/19 0500) Last BM Date : 05/21/22  General:   Alert,  Well-developed, well-nourished, pleasant and cooperative in NAD Head:  Normocephalic and atraumatic. Eyes:  Sclera clear, no icterus.   Conjunctiva pink. Ears:  Normal auditory acuity. Mouth:  No deformity or lesions, dentition normal. Lungs:  Clear throughout to auscultation.   No wheezes, crackles, or rhonchi. No acute distress. Heart:  Regular rate and rhythm; no murmurs, clicks, rubs,  or gallops. Abdomen:  Soft, nontender and nondistended. No masses, hepatosplenomegaly or hernias noted. Normal bowel sounds, without guarding, and without rebound.   Rectal: deferred   Msk:  Symmetrical without gross deformities. Normal posture. Extremities:  Without clubbing or edema. Neurologic:  Alert and  oriented x4. Skin:  Intact without significant lesions or rashes. Psych:  Alert and cooperative. Normal mood and affect.  Intake/Output from previous day: 01/18 0701 - 01/19 0700 In: 415 [Blood:315; IV Piggyback:100] Out: 600  Intake/Output this shift: No intake/output data recorded.   Labs/Studies   Recent Labs Recent Labs    05/19/22 0433 05/20/22 0501 05/20/22 2000 05/21/22 0456  WBC 8.4 13.1*  --  15.4*  HGB 7.1* 6.3* 7.6* 6.1*  HCT 21.3* 18.7* 21.9* 17.5*  PLT 135* 129*  --  109*   BMET Recent Labs    05/19/22 0433 05/20/22 0501 05/21/22 0456  NA 139 138 135  K 3.4* 3.2* 3.0*  CL 105 106 101  CO2 17* 15* 22  GLUCOSE 100* 141* 120*  BUN 85* 113* 78*  CREATININE 5.57*  5.92* 4.03*  CALCIUM 7.0* 6.8* 6.8*   LFT Recent Labs    05/19/22 0433 05/20/22 0501 05/21/22 0456  ALBUMIN 2.2* 2.1* 1.9*   PT/INR  Recent Labs    05/19/22 0433  LABPROT 15.1  INR 1.2   Hepatitis Panel Recent Labs    05/18/22 1052 05/19/22 0833  HEPBSAG NON REACTIVE  --   HCVAB  --  NON REACTIVE   C-Diff No results for input(s): "CDIFFTOX" in the last 72 hours.  Radiology/Studies DG CHEST PORT 1 VIEW  Result Date: 05/21/2022 CLINICAL DATA:  Leukocytosis EXAM: PORTABLE CHEST 1 VIEW COMPARISON:  05/18/2022 FINDINGS: Central venous line tip in distal SVC. Normal cardiac silhouette. Small RIGHT effusion. Mild venous congestion. No pneumothorax. No focal consolidation. Lymphadenectomy clips in the LEFT axilla. No significant interval change. IMPRESSION: 1. No significant interval change. 2. Small RIGHT effusion. 3. Mild venous congestion. Electronically Signed   By: Suzy Bouchard M.D.   On: 05/21/2022 08:22   US BIOPSY (KIDNEY)  Result Date: 05/19/2022 INDICATION: 72 year old female with history of end-stage renal disease. EXAM: ULTRASOUND GUIDED RENAL BIOPSY COMPARISON:  None Available. MEDICATIONS: None. ANESTHESIA/SEDATION: Fentanyl 25 mcg IV; Versed 0.5 mg IV Total Moderate Sedation time: 10 minutes; The patient was continuously monitored during the procedure by the interventional radiology nurse under my direct supervision. COMPLICATIONS: None immediate. PROCEDURE: Informed written consent was obtained from the patient after a discussion of the risks, benefits and alternatives to treatment. The patient understands and consents the procedure. A timeout was performed prior to the initiation of the procedure. Ultrasound scanning was performed of the bilateral flanks. The inferior pole of the left kidney was selected for biopsy due to location and sonographic window. The procedure was planned. The operative site was prepped and draped in the usual sterile fashion. The overlying  soft tissues were anesthetized with 1% lidocaine with epinephrine. A 17 gauge coaxial introducer needle was advanced into the inferior cortex of the left kidney and 2 core biopsies were obtained under direct ultrasound guidance with a 16 gauge core biopsy device. Images were saved for documentation purposes. The biopsy device was removed and hemostasis was obtained with manual compression after injection of Gel-Foam slurry along the needle track under ultrasound guidance. Post procedural scanning was negative for significant post procedural hemorrhage or additional complication. A dressing was placed. The patient tolerated the procedure well without immediate post procedural complication. IMPRESSION: Technically successful ultrasound guided left renal biopsy. Ruthann Cancer, MD [facility] Radiology Electronically Signed   By: Ruthann Cancer M.D.   On: 05/19/2022 14:19     Assessment   Sheri Cooper is a 72 y.o. year old female history or COPD, RA, CKD stage 4 who was admitted with pneumonia and worsening renal function fn 05/16/22 after recent discharge from OSH on 05/12/22. Has been treated with antibiotics for pneumonia and temporary dialysis catheter placed for dialysis this admission. GI consulted for further evaluation of worsening anemia in the setting of rectal bleeding.   Anemia, rectal bleeding: Presented with Hgb 10.1. Baseline anemia likely in the setting of CKD however began to have bright red blood per rectum 1/18 and drop in Hgb to 6.3. Received 1u PRBC and repeat Hgb this morning 6.1 with 2 reported bloody BM overnight. Will receive 2u PRBC this morning.  Patient reports remote history of colonoscopy about 5 years prior and also states she has not had a prior history of colon resection due to a twisting her colon.  Denies any complications from this.  Denies any melena.  Denies any anticoagulation or frequent NSAID use.  Denies any upper GI  symptoms.  Discussed possibility of performing  colonoscopy to further assess the etiology of her anemia and rectal bleeding including dieulafoy lesion, AVM, polyps. Patient agreeable to proceed with TCS and possible EGD if TCS negative.   Plan / Recommendations   Agree with 2u PRBC Continue to monitor H/H, transfuse Hgb <7 PPI daily Clear liquid diet NPO midnight Colonoscopy tomorrow and possible EGD if negative with Dr. Jenetta Downer.     05/21/2022, 9:06 AM  Venetia Night, MSN, FNP-BC, AGACNP-BC [facility]

## 2022-05-21 NOTE — Progress Notes (Signed)
Pharmacy Heparin Induced Thrombocytopenia (HIT) Note:  Sheri Cooper is an 72 y.o. female being evaluated for HIT. Heparin was started 05/16/22 for dvt px, and baseline platelets were 254.   HIT labs were ordered on 05/21/22 when platelets dropped to 109.   CALCULATE SCORE:  4Ts (see the HIT Algorithm) Score  Thrombocytopenia 2  Timing 0  Thrombosis 0  Other causes of thrombocytopenia 1  Total 3     Recommendations (A or B) are based on available lab results (HIT antibody and/or SRA) and the HIT algorithm    A. HIT antibody result available HIT antibody sent 05/21/22   B. SRA result availability  SRA not available   Name of MD Contacted: [place] (Discussed with provider) Labs ordered:  SRA not needed at this time Heparin allergy:  No allergy documentation needed. Anticoagulation plans:  off heparin due to bleeding  Erin Hearing PharmD., BCPS Clinical Pharmacist 05/21/2022 12:11 PM

## 2022-05-21 NOTE — Progress Notes (Signed)
Received patient in bed to unit.  Alert and oriented.  Informed consent signed and in chart.   Treatment initiated: 1030 Treatment completed: 1330  Patient tolerated well.  Transported back to the room  Alert, without acute distress.  Hand-off given to patient's nurse.   Access used: catheter Access issues: none  Total UF removed: 2 L Medication(s) given: 2 units PRBC Post HD VS: 150/50 P 76 R 16 O2 sat 96 in room air. Post HD weight: 79 KG   [facility]

## 2022-05-21 NOTE — Progress Notes (Signed)
[facility] Progress Note    Assessment/ Plan:   AKI -worsening since April '23 with hematuria and proteinuria. S/p Renal biopsy 1/17. Purpose of biopsy is not only diagnostic but to see if there is any reversibility given the length of time this is happening. Biopsy report pending -ANCA, ANA, anti-GBM neg, C3/C4 WNL. HIV, Hep B neg. FLC within acceptable limits. SPEP pending -empiric steroids started 1/16 (solumedrol 1g x 3 days), transition to prednisone '60mg'$  daily starting 1/19 -started HD given severity of AKI on 1/16, appreciate assistance from surgery with RIJ temp line placement on 1/16 -plan for HD#3 today, HD tomorrow, will keep on TTS schedule thereafter -will need outpatient placement-[facility] to [facility] TTS. Appreciate IR's assistance, TDC planned for 1/22 -Avoid nephrotoxic medications including NSAIDs and iodinated intravenous contrast exposure unless the latter is absolutely indicated.  Preferred narcotic agents for pain control are hydromorphone, fentanyl, and methadone. Morphine should not be used. Avoid Baclofen and avoid oral sodium phosphate and magnesium citrate based laxatives / bowel preps. Continue strict Input and Output monitoring. Will monitor the patient closely with you and intervene or adjust therapy as indicated by changes in clinical status/labs   HCAP, COPD -abx per primary service  HTN -will UF as tolerated with HD  Anemia of chronic disease -iron replete, aranesp started 1/16 -transfuse prn -FOBT positive, GI consulted.   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 PO4 improving  Metabolic acidosis -managing with HD, will d/c nahco3 as bicarb improves  Thrombocytopenia -worsening plt count. Would recommend d/c'ing heparin but will defer to primary service for further work up and management  Subjective:   Patient seen and examined in ICU. No acute events overnight. UF yesterday limited by hypotension therefore net UF 0.6L. will try  albumin today. Hgb down to 6.1, FOBT positive. To receive 2u PRBC   Objective:   BP (!) 174/52   Pulse 75   Temp 97.6 F (36.4 C) (Oral)   Resp 19   Ht '4\' 11"'$  (1.499 m)   Wt 53.3 kg   SpO2 95%   BMI 23.73 kg/m   Intake/Output Summary (Last 24 hours) at 05/21/2022 1016 Last data filed at 05/20/2022 1528 Gross per 24 hour  Intake 315 ml  Output 600 ml  Net -285 ml   Weight change: 0.2 kg  Physical Exam: Gen: NAD, sitting up in bed CVS: RRR Resp: decreased breath sounds bibasilar, bl chest expansion Abd: soft, nt/nd Ext: trace pitting edema b/l LEs Neuro: awake, alert Dialysis access: RIJ temp HD line  Imaging: DG CHEST PORT 1 VIEW  Result Date: 05/21/2022 CLINICAL DATA:  Leukocytosis EXAM: PORTABLE CHEST 1 VIEW COMPARISON:  05/18/2022 FINDINGS: Central venous line tip in distal SVC. Normal cardiac silhouette. Small RIGHT effusion. Mild venous congestion. No pneumothorax. No focal consolidation. Lymphadenectomy clips in the LEFT axilla. No significant interval change. IMPRESSION: 1. No significant interval change. 2. Small RIGHT effusion. 3. Mild venous congestion. Electronically Signed   By: Suzy Bouchard M.D.   On: 05/21/2022 08:22   US BIOPSY (KIDNEY)  Result Date: 05/19/2022 INDICATION: 72 year old female with history of end-stage renal disease. EXAM: ULTRASOUND GUIDED RENAL BIOPSY COMPARISON:  None Available. MEDICATIONS: None. ANESTHESIA/SEDATION: Fentanyl 25 mcg IV; Versed 0.5 mg IV Total Moderate Sedation time: 10 minutes; The patient was continuously monitored during the procedure by the interventional radiology nurse under my direct supervision. COMPLICATIONS: None immediate. PROCEDURE: Informed written consent was obtained from the patient after a discussion of the risks, benefits and  alternatives to treatment. The patient understands and consents the procedure. A timeout was performed prior to the initiation of the procedure. Ultrasound scanning was performed of the  bilateral flanks. The inferior pole of the left kidney was selected for biopsy due to location and sonographic window. The procedure was planned. The operative site was prepped and draped in the usual sterile fashion. The overlying soft tissues were anesthetized with 1% lidocaine with epinephrine. A 17 gauge coaxial introducer needle was advanced into the inferior cortex of the left kidney and 2 core biopsies were obtained under direct ultrasound guidance with a 16 gauge core biopsy device. Images were saved for documentation purposes. The biopsy device was removed and hemostasis was obtained with manual compression after injection of Gel-Foam slurry along the needle track under ultrasound guidance. Post procedural scanning was negative for significant post procedural hemorrhage or additional complication. A dressing was placed. The patient tolerated the procedure well without immediate post procedural complication. IMPRESSION: Technically successful ultrasound guided left renal biopsy. Ruthann Cancer, MD [facility] Radiology Electronically Signed   By: Ruthann Cancer M.D.   On: 05/19/2022 14:19    Labs: BMET Recent Labs  Lab 05/16/22 0309 05/16/22 0711 05/17/22 0402 05/18/22 0300 05/19/22 0433 05/20/22 0501 05/21/22 0456  NA 141  --  141 142 139 138 135  K 3.8  --  4.2 3.9 3.4* 3.2* 3.0*  CL 114*  --  112* 112* 105 106 101  CO2 11*  --  9* 8* 17* 15* 22  GLUCOSE 86  --  62* 57* 100* 141* 120*  BUN 75*  --  108* 118* 85* 113* 78*  CREATININE 7.50*  --  7.51* 8.12* 5.57* 5.92* 4.03*  CALCIUM 7.4*  --  7.2* 7.2* 7.0* 6.8* 6.8*  PHOS  --  11.9*  --  12.8* 8.4* 8.8* 5.2*   CBC Recent Labs  Lab 05/16/22 0309 05/17/22 0757 05/18/22 0300 05/19/22 0433 05/20/22 0501 05/20/22 2000 05/21/22 0456  WBC 15.3*   < > 20.2* 8.4 13.1*  --  15.4*  NEUTROABS 12.6*  --   --   --   --   --   --   HGB 10.1*   < > 8.5* 7.1* 6.3* 7.6* 6.1*  HCT 30.6*   < >  26.2* 21.3* 18.7* 21.9* 17.5*  MCV 79.1*   < > 81.4 78.0* 77.6*  --  78.8*  PLT 254   < > 157 135* 129*  --  109*   < > = values in this interval not displayed.    Medications:     sodium chloride   Intravenous Once   amLODipine  10 mg Oral Daily   calcium acetate  667 mg Oral TID WC   Chlorhexidine Gluconate Cloth  6 each Topical Daily   Chlorhexidine Gluconate Cloth  6 each Topical Q0600   darbepoetin (ARANESP) injection - DIALYSIS  150 mcg Subcutaneous Q Tue-1800   dextromethorphan-guaiFENesin  1 tablet Oral BID   feeding supplement  237 mL Oral BID BM   hydrALAZINE  50 mg Oral TID   pantoprazole  40 mg Oral Daily   sodium bicarbonate  1,300 mg Oral BID      Gean Quint, MD [facility] 05/21/2022, 10:16 AM

## 2022-05-21 NOTE — Care Management Important Message (Signed)
Important Message  Patient Details  Name: Sheri Cooper MRN: [id number] Date of Birth: March 11, 1951   Medicare Important Message Given:  Yes     Tommy Medal 05/21/2022, 4:30 PM

## 2022-05-21 NOTE — Progress Notes (Signed)
Patient agreeable to colonoscopy/esophagogastroduodenoscopy tomorrow. Patient signed consent form. Consent form placed in chart. Bowel prep started.

## 2022-05-21 NOTE — Progress Notes (Signed)
Interventional Radiology Brief Note:  Patient with hemoglobin 6.1 and WBC 15.4. Currently has a temp catheter in place that is functional.   IR will continue to work towards tunneled dialysis catheter placement based on clinical status.   Brynda Greathouse, MS RD PA-C 9:10 AM

## 2022-05-21 NOTE — Progress Notes (Signed)
Patient's hemoglobin this morning is 6.1. Dr. Wynetta Emery aware. Per night shift, patient had 2 bloody bowel movements during the night. Patient had one bloody bowel movement yesterday 1/18 during day shift. Fecal Occult card was done and it was positive. GI consulted. Patient scheduled for hemodialysis today. 2 units of PRBCs ordered to be given during dialysis.

## 2022-05-21 NOTE — Progress Notes (Signed)
Patient didn't have a urine output for a whole shift, did Bladder scan at 5 AM, 233m in the bladder, patient didn't feel an urge to void, will continue to monitor.

## 2022-05-22 ENCOUNTER — Inpatient Hospital Stay (HOSPITAL_COMMUNITY): Payer: Medicare Other | Admitting: Anesthesiology

## 2022-05-22 ENCOUNTER — Inpatient Hospital Stay (HOSPITAL_COMMUNITY): Payer: Medicare Other

## 2022-05-22 ENCOUNTER — Encounter (HOSPITAL_COMMUNITY)
Admission: EM | Disposition: A | Discharge status: Skilled Nursing Facility | Payer: Self-pay | Source: Home / Self Care | Attending: Internal Medicine

## 2022-05-22 DIAGNOSIS — K625 Hemorrhage of anus and rectum: Secondary | ICD-10-CM | POA: Diagnosis not present

## 2022-05-22 DIAGNOSIS — E876 Hypokalemia: Secondary | ICD-10-CM | POA: Diagnosis not present

## 2022-05-22 DIAGNOSIS — K921 Melena: Secondary | ICD-10-CM

## 2022-05-22 DIAGNOSIS — J189 Pneumonia, unspecified organism: Secondary | ICD-10-CM | POA: Diagnosis not present

## 2022-05-22 DIAGNOSIS — K922 Gastrointestinal hemorrhage, unspecified: Secondary | ICD-10-CM

## 2022-05-22 DIAGNOSIS — K264 Chronic or unspecified duodenal ulcer with hemorrhage: Secondary | ICD-10-CM

## 2022-05-22 DIAGNOSIS — K2970 Gastritis, unspecified, without bleeding: Secondary | ICD-10-CM

## 2022-05-22 DIAGNOSIS — K259 Gastric ulcer, unspecified as acute or chronic, without hemorrhage or perforation: Secondary | ICD-10-CM

## 2022-05-22 DIAGNOSIS — D500 Iron deficiency anemia secondary to blood loss (chronic): Secondary | ICD-10-CM | POA: Diagnosis not present

## 2022-05-22 DIAGNOSIS — K209 Esophagitis, unspecified without bleeding: Secondary | ICD-10-CM

## 2022-05-22 HISTORY — PX: COLONOSCOPY WITH PROPOFOL: SHX5780

## 2022-05-22 HISTORY — PX: ESOPHAGOGASTRODUODENOSCOPY (EGD) WITH PROPOFOL: SHX5813

## 2022-05-22 LAB — CBC
HCT: 27.9 % — ABNORMAL LOW (ref 36.0–46.0)
Hemoglobin: 9.8 g/dL — ABNORMAL LOW (ref 12.0–15.0)
MCH: 28.7 pg (ref 26.0–34.0)
MCHC: 35.1 g/dL (ref 30.0–36.0)
MCV: 81.6 fL (ref 80.0–100.0)
Platelets: 134 10*3/uL — ABNORMAL LOW (ref 150–400)
RBC: 3.42 MIL/uL — ABNORMAL LOW (ref 3.87–5.11)
RDW: 15.9 % — ABNORMAL HIGH (ref 11.5–15.5)
WBC: 24 10*3/uL — ABNORMAL HIGH (ref 4.0–10.5)
nRBC: 1.2 % — ABNORMAL HIGH (ref 0.0–0.2)

## 2022-05-22 LAB — RENAL FUNCTION PANEL
Albumin: 2.1 g/dL — ABNORMAL LOW (ref 3.5–5.0)
Anion gap: 11 (ref 5–15)
BUN: 47 mg/dL — ABNORMAL HIGH (ref 8–23)
CO2: 25 mmol/L (ref 22–32)
Calcium: 7.2 mg/dL — ABNORMAL LOW (ref 8.9–10.3)
Chloride: 99 mmol/L (ref 98–111)
Creatinine, Ser: 2.73 mg/dL — ABNORMAL HIGH (ref 0.44–1.00)
GFR, Estimated: 18 mL/min — ABNORMAL LOW (ref >60)
Glucose, Bld: 96 mg/dL (ref 70–99)
Phosphorus: 3.6 mg/dL (ref 2.5–4.6)
Potassium: 2.6 mmol/L — CL (ref 3.5–5.1)
Sodium: 135 mmol/L (ref 135–145)

## 2022-05-22 LAB — POTASSIUM: Potassium: 3 mmol/L — ABNORMAL LOW (ref 3.5–5.1)

## 2022-05-22 LAB — MAGNESIUM: Magnesium: 1.7 mg/dL (ref 1.7–2.4)

## 2022-05-22 SURGERY — COLONOSCOPY WITH PROPOFOL
Anesthesia: General

## 2022-05-22 MED ORDER — PROPOFOL 10 MG/ML IV BOLUS
INTRAVENOUS | Status: AC
  Filled 2022-05-22: qty 20

## 2022-05-22 MED ORDER — POTASSIUM CHLORIDE 10 MEQ/100ML IV SOLN
10.0000 meq | INTRAVENOUS | Status: AC
  Administered 2022-05-22 (×2): 10 meq via INTRAVENOUS
  Filled 2022-05-22 (×2): qty 100

## 2022-05-22 MED ORDER — FENTANYL CITRATE (PF) 100 MCG/2ML IJ SOLN
50.0000 ug | Freq: Once | INTRAMUSCULAR | Status: DC
  Administered 2022-05-22: 50 ug via INTRAVENOUS

## 2022-05-22 MED ORDER — PANTOPRAZOLE SODIUM 40 MG IV SOLR
40.0000 mg | Freq: Two times a day (BID) | INTRAVENOUS | Status: DC
  Administered 2022-05-26 – 2022-06-05 (×21): 40 mg via INTRAVENOUS
  Filled 2022-05-22 (×21): qty 10

## 2022-05-22 MED ORDER — METHYLPREDNISOLONE SODIUM SUCC 40 MG IJ SOLR
40.0000 mg | Freq: Once | INTRAMUSCULAR | Status: AC
  Administered 2022-05-22: 40 mg via INTRAVENOUS
  Filled 2022-05-22: qty 1

## 2022-05-22 MED ORDER — POTASSIUM CHLORIDE 10 MEQ/100ML IV SOLN: 10.0000 meq | Freq: Once | INTRAVENOUS | Status: DC

## 2022-05-22 MED ORDER — EPINEPHRINE 1 MG/10ML IJ SOSY
PREFILLED_SYRINGE | INTRAMUSCULAR | Status: AC
  Filled 2022-05-22: qty 10

## 2022-05-22 MED ORDER — PANTOPRAZOLE 80MG IVPB - SIMPLE MED
80.0000 mg | Freq: Once | INTRAVENOUS | Status: AC
  Administered 2022-05-22: 80 mg via INTRAVENOUS
  Filled 2022-05-22: qty 100

## 2022-05-22 MED ORDER — PROPOFOL 10 MG/ML IV BOLUS
INTRAVENOUS | Status: DC | PRN
  Administered 2022-05-22: 200 mg via INTRAVENOUS
  Administered 2022-05-22: 100 mg via INTRAVENOUS

## 2022-05-22 MED ORDER — FENTANYL CITRATE PF 50 MCG/ML IJ SOSY
PREFILLED_SYRINGE | INTRAMUSCULAR | Status: AC
  Filled 2022-05-22: qty 1

## 2022-05-22 MED ORDER — SODIUM CHLORIDE 0.9 % IV SOLN: INTRAVENOUS | Status: DC | PRN

## 2022-05-22 MED ORDER — GLUCAGON HCL RDNA (DIAGNOSTIC) 1 MG IJ SOLR
INTRAMUSCULAR | Status: AC
  Filled 2022-05-22: qty 1

## 2022-05-22 MED ORDER — POTASSIUM CHLORIDE CRYS ER 20 MEQ PO TBCR
20.0000 meq | EXTENDED_RELEASE_TABLET | Freq: Once | ORAL | Status: AC
  Administered 2022-05-22: 20 meq via ORAL
  Filled 2022-05-22: qty 1

## 2022-05-22 MED ORDER — SUCRALFATE 1 GM/10ML PO SUSP
1.0000 g | Freq: Three times a day (TID) | ORAL | Status: DC
  Administered 2022-05-22 – 2022-05-27 (×17): 1 g via ORAL
  Filled 2022-05-22 (×19): qty 10

## 2022-05-22 MED ORDER — PANTOPRAZOLE INFUSION (NEW) - SIMPLE MED
8.0000 mg/h | INTRAVENOUS | Status: AC
  Administered 2022-05-22 – 2022-05-25 (×7): 8 mg/h via INTRAVENOUS
  Filled 2022-05-22: qty 80
  Filled 2022-05-22 (×3): qty 100
  Filled 2022-05-22: qty 80
  Filled 2022-05-22 (×2): qty 100
  Filled 2022-05-22 (×2): qty 80
  Filled 2022-05-22: qty 100
  Filled 2022-05-22: qty 80

## 2022-05-22 MED ORDER — SODIUM CHLORIDE (PF) 0.9 % IJ SOLN
PREFILLED_SYRINGE | INTRAMUSCULAR | Status: DC | PRN
  Administered 2022-05-22: 11 mL

## 2022-05-22 MED ORDER — GLUCAGON HCL RDNA (DIAGNOSTIC) 1 MG IJ SOLR
INTRAMUSCULAR | Status: DC | PRN
  Administered 2022-05-22: .25 mg via INTRAVENOUS

## 2022-05-22 MED ORDER — LACTATED RINGERS IV SOLN: INTRAVENOUS | Status: DC

## 2022-05-22 MED ORDER — MAGNESIUM SULFATE 2 GM/50ML IV SOLN
2.0000 g | Freq: Once | INTRAVENOUS | Status: AC
  Administered 2022-05-22: 2 g via INTRAVENOUS
  Filled 2022-05-22: qty 50

## 2022-05-22 MED ORDER — FENTANYL CITRATE (PF) 100 MCG/2ML IJ SOLN
50.0000 ug | INTRAMUSCULAR | Status: DC | PRN
  Administered 2022-05-25: 50 ug via INTRAVENOUS
  Filled 2022-05-22: qty 2

## 2022-05-22 MED ORDER — FENTANYL CITRATE (PF) 100 MCG/2ML IJ SOLN: 50.0000 ug | INTRAMUSCULAR | Status: DC | PRN

## 2022-05-22 NOTE — Progress Notes (Signed)
Received call from nephropathology just now. 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 Plan: stay the course with steroids for now, will need prolonged taper course. EM pending. Please call covering nephrologist with any questions/concerns.  Gean Quint, MD [facility] [place]

## 2022-05-22 NOTE — Progress Notes (Signed)
Patient had EGD done today and was sent back with a pink card with instructions to keep the card on person once discharged for the hospital to indicated that she cannot get MRIs. Writer has informed patient about the pink card and answered all the questions. Patient's son called and was connected to patient room. Patient is tolerating a clear liquid diet. LR and Protonix are infusing via pigtail on Trialysis catheter. Will endorse to oncoming RN

## 2022-05-22 NOTE — Transfer of Care (Signed)
Immediate Anesthesia Transfer of Care Note  Patient: Sheri Cooper  Procedure(s) Performed: COLONOSCOPY WITH PROPOFOL ESOPHAGOGASTRODUODENOSCOPY (EGD) WITH PROPOFOL  Patient Location: PACU  Anesthesia Type:General  Level of Consciousness: awake and alert   Airway & Oxygen Therapy: Patient Spontanous Breathing  Post-op Assessment: Report given to RN  Post vital signs: Reviewed and stable  Last Vitals:  Vitals Value Taken Time  BP 139/56 05/22/22 1230  Temp 98   Pulse 76 05/22/22 1231  Resp 16 05/22/22 1231  SpO2 97 % 05/22/22 1231  Vitals shown include unvalidated device data.  Last Pain:  Vitals:   05/22/22 1125  TempSrc: Oral  PainSc: 0-No pain      Patients Stated Pain Goal: 5 (62/[id number])  Complications: No notable events documented.

## 2022-05-22 NOTE — Brief Op Note (Signed)
05/16/2022 - 05/22/2022  11:51 AM  PATIENT:  Lei S Blackston  72 y.o. female  PRE-OPERATIVE DIAGNOSIS:  rectal bleeding, anemia  POST-OPERATIVE DIAGNOSIS:  melena;  PROCEDURE:  Procedure(s): COLONOSCOPY WITH PROPOFOL (N/A) ESOPHAGOGASTRODUODENOSCOPY (EGD) WITH PROPOFOL (N/A)  SURGEON:  Surgeon(s) and Role:    * Harvel Quale, MD - Primary  Patient underwent colonoscopy under propofol sedation.  Tolerated the procedure adequately.   - Preparation of the colon was fair.  - Melena in the terminal ileum.  - Melena in the entire examined colon.  - The distal rectum and anal verge are normal on retroflexion view.   Due to the colonoscopy findings,we proceeded with EGD as previously consented.  FINDINGS: - LA Grade C (one or more mucosal breaks continuous between tops of 2 or more mucosal folds, less than 75% circumference) esophagitis with no bleeding was found at the gastroesophageal junction.  - Diffuse moderate inflammation characterized by congestion (edema) and erythema was found in the gastric antrum.  - Two non-bleeding cratered gastric ulcers with a flat pigmented spot (Forrest Class IIc) were found in the gastric fundus.  The largest lesion was 6 mm in largest dimension.  -Three non-bleeding cratered duodenal ulcers with a nonbleeding visible vessel in the largest ulcer (Forrest Class IIa) were found in the first portion of the duodenum. There was a large adhered clot was large.  The largest lesion was 15 mm in largest dimension.  Area was successfully injected with 6 mL of a 0.1 mg/mL solution of epinephrine for hemostasis. I attempted to remove the large clot to visualize the underlying tissue, there was a large vessel at the base of the clot, which showed ongoing arterial bleeding after removal of the clot. To stop active bleeding at the veseel area, one hemostatic clip was successfully placed.  Clip manufacturer: [employer].  Coagulation for hemostasis using  bipolar probe was successful.  -Five non-bleeding cratered duodenal ulcers with an adherent clot (Forrest Class IIb) were found in the second portion of the duodenum.  The largest lesion was 12 mm in largest dimension.  Area was successfully injected with 5 mL of a 0.1 mg/mL solution of epinephrine for prevention of bleedin after removal of clot. For hemostasis, one hemostatic clip was deployed but fell off the mucosa.  Coagulation for bleeding prevention  of an arterial vessel located below the clot using bipolar probe was successful.    RECOMMENDATIONS - Return patient to ICU for ongoing care.  - Clear liquid diet today.  - Pantoprazole drip for 72 hours, will need to switch to BID upon discharge. - Sucralfate 1 g every 6 hours. - Avoid NSAIDs. - Repeat EGD in 2 months. - If patient presents recurrent clinical bleeding or hemodynamic instability, perform CT Angio bleed protocol STAT - consult IR if acrtive bleeding is identified. - Repeat colonoscopy is not recommended due to current age (23 years or older) for screening purposes.   Maylon Peppers, MD Gastroenterology and Hepatology [facility]

## 2022-05-22 NOTE — Progress Notes (Signed)
Patient complained of abdominal pain in the epigastric area after esophagogastroduodenospy was performed.  Abdomen is soft to palpation but she has tenderness in the epigastric area.  This could be related to interventions and gas distention, she will be given a dose of fentanyl by anesthesia and a KUB will be performed.  Notably, there was no evidence of perforation during the endoscopic evaluation after epinephrine and ablation were performed.

## 2022-05-22 NOTE — Progress Notes (Addendum)
PROGRESS NOTE   Sheri Cooper, is a 72 y.o. female, DOB - 22-Dec-1950, SJG:[id number]  Admit date - 05/16/2022   Admitting Physician Bernadette Hoit, DO  Outpatient Primary MD for the patient is [facility], [facility]  LOS - 6  Chief Complaint  Patient presents with   Shortness of Breath      Brief Summary:-  72 y.o. female with medical history significant of COPD, rheumatoid arthritis, stage V CKD  Admitted on 05/16/2022 with healthcare associated pneumonia and worsening renal function with anion gap metabolic acidosis -Patient was recently discharged from [facility] on 05/12/2022 after treatment for COPD.  Pt was started on hemodialysis on 05/18/22 after temporary central line placement and had renal biopsy done on 05/19/22.     A/p HCAP--- recent hospitalization at [facility] as outlined above -COVID, flu and RSV negative -Leukocytosis and elevated procalcitonin noted WBC 15.3 >> 19.8 >>20.2 -completed last dose cefepime 05/20/21 -Bronchodilators and mucolytics as ordered   Social/Ethics--plan of care and advanced directive discussed with patient and patient's brother Trilby Drummer -Patient is a full code   AKI on CKD 5--very poor urine output, significant metabolic acidosis noted, becoming more sleepy - now transitioned to hemodialysis  -Review of records in [place] shows that her current renal function is close to recent baseline -Anion gap metabolic acidosis noted - patient sees Dr. Theador Hawthorne as outpatient---discussed with Dr. Lars Masson was concerned that patient would need hemodialysis this admission which has now been started -Care Everywhere [facility] -Right IJ HD catheter placed by Dr. Okey Dupre; IR for [facility] being arranged by nephrology - IR planning to place [facility] on Mon 05/24/22 -Initiated on hemodialysis on 05/18/2022 -completed IR renal biopsy on 05/19/2022 at [facility] -renally adjust medications, avoid nephrotoxic agents /  dehydration  / hypotension   COPD -continue bronchodilators -Pt is on high dose steroids per nephrology for AKI  Leukocytosis  - I think this is a leukemoid reaction from high dose steroids that patient is on - we have not found any evidence of infection at this time  - we are following CBC  Hypokalemia -IV replacement ordered 20 meq IV prior to procedures this morning -recheck later today -replacing low magnesium  Acute blood loss Anemia / Anemia in CKD -Ferritin and serum iron are not low -Hg down to 6.3, transfused 1 unit PRBC with HD on 1/18, had large bloody BM, Hg down to 6.1 1/19, transfuse 2 units PRBC with HD on 1/19  -recheck Hg improved to 9.8  Guaiac Positive Stools / GI bleeding -large bloody BM noted by RN guaiac positive -DC'd subcutaneous heparin on 1/18 -transfused 1 unit PRBC with HD on 1/18, today Hg down again to 6.1, transfuse 2 unit PRBC 1/19 with HD  -GI team to eval 1/19 -continue protonix 40 mg daily  -transfused 2 units PRBC with HD 1/19, Hg up to 9.8 -EGD / Colonoscopy planned for 05/22/22 with Dr. Jenetta Downer  Thrombocytopenia - we stopped [place] heparin on 1/18 - check HIT antibody labs - pending - platelets improved to 134 after stopping all heparin  - "pharmacy on HIT alert" order placed   Incidental finding of multiple renal lesions CT angiography of chest showed multiple renal lesions bilaterally, incompletely characterized on today's noncontrast CT examination, including an indeterminate 1.9 cm lesion in the anterior aspect of the interpolar region of the left kidney.  Follow-up nonemergent outpatient abdominal MRI with and without IV gadolinium is recommended in the near future to definitively evaluate this  lesion and exclude neoplasm   Elevated BNP--echo from 05/10/2022 at [facility] available in [place] reveals-  The left ventricle is normal in size with normal wall thickness. The left ventricular systolic function is normal, LVEF is  visually estimated at 60-65%.  There is normal left ventricular diastolic function.  -There is mild to moderate pulmonary hypertension no significant valvular abnormalities -hemodialysis to manage volume status   Loose Stools -x 3 days.... Recent hospitalizations at [facility] and recent exposure to antibiotics - stool for C. difficile and GI pathogen--negative - diarrhea resolved   Procedures:- -Right IJ HD catheter placed on 05/18/22 -Initiated on hemodialysis on 05/18/2022 -completed IR renal biopsy on 05/19/2022 at [facility]  Status is: Inpatient   Disposition: The patient is from: Home              Anticipated d/c is to: Home Vs SNF--clipped to an outpatient HD center              Anticipated d/c date is: 2 days              Patient currently is not medically stable to d/c. Barriers: Not Clinically Stable-  Code Status :  -  Code Status: Full Code   Family Communication:    NA (patient is alert, awake and coherent)   DVT Prophylaxis  :   - SCDs   SCDs Start: 05/16/22 0644   Lab Results  Component Value Date   PLT 134 (L) 05/22/2022   Inpatient Medications  Scheduled Meds:  sodium chloride   Intravenous Once   amLODipine  10 mg Oral Daily   calcium acetate  667 mg Oral TID WC   Chlorhexidine Gluconate Cloth  6 each Topical Daily   Chlorhexidine Gluconate Cloth  6 each Topical Q0600   darbepoetin (ARANESP) injection - DIALYSIS  150 mcg Subcutaneous Q Tue-1800   dextromethorphan-guaiFENesin  1 tablet Oral BID   feeding supplement  237 mL Oral BID BM   hydrALAZINE  50 mg Oral TID   pantoprazole  40 mg Oral Daily   predniSONE  60 mg Oral Q breakfast   sodium bicarbonate  1,300 mg Oral BID   Continuous Infusions:  magnesium sulfate bolus IVPB     PRN Meds:.acetaminophen **OR** acetaminophen, alteplase, heparin, ipratropium-albuterol, labetalol, loperamide, oxyCODONE, prochlorperazine   Anti-infectives (From admission, onward)    Start     Dose/Rate Route Frequency  Ordered Stop   05/17/22 0800  ceFEPIme (MAXIPIME) 1 g in sodium chloride 0.9 % 100 mL IVPB  Status:  Discontinued        1 g 200 mL/hr over 30 Minutes Intravenous Every 24 hours 05/16/22 0641 05/20/22 1012   05/16/22 0641  vancomycin variable dose per unstable renal function (pharmacist dosing)  Status:  Discontinued         Does not apply See admin instructions 05/16/22 0641 05/17/22 1605   05/16/22 0445  vancomycin (VANCOREADY) IVPB 1250 mg/250 mL        1,250 mg 166.7 mL/hr over 90 Minutes Intravenous  Once 05/16/22 0444 05/16/22 0732   05/16/22 0430  ceFEPIme (MAXIPIME) 2 g in sodium chloride 0.9 % 100 mL IVPB        2 g 200 mL/hr over 30 Minutes Intravenous  Once 05/16/22 0426 05/16/22 0534       Subjective: Sheri Cooper agreeable to EGD/colon today, no blood bowel movement reported, tolerated HD treatment yesterday with no acute complaints.      Objective: Vitals:  05/22/22 0830 05/22/22 0900 05/22/22 0930 05/22/22 1000  BP:  (!) 146/74  (!) 155/43  Pulse: 72 66 71 75  Resp: '11 11 20 12  '$ Temp: 98.5 F (36.9 C)     TempSrc: Oral     SpO2: 96% 96% 95% 97%  Weight:      Height:        Intake/Output Summary (Last 24 hours) at 05/22/2022 1112 Last data filed at 05/21/2022 1330 Gross per 24 hour  Intake 1587.5 ml  Output 2000 ml  Net -412.5 ml   Filed Weights   05/21/22 1040 05/21/22 1335 05/22/22 0500  Weight: 53.3 kg 52 kg 50.8 kg    Physical Exam  Gen: awake, alert, cooperative, NAD; appears less pale today  HEENT: NCAT; pERRL Neck-Supple Neck, right IJ hemodialysis catheter in place; no sign of infection seen Lungs-BBS shallow but clear to auscultation   CV- normal s1, s2 sounds;   Abd-  normal BS, ND/NT, no HSM   Extremity/Skin:-  1+ edema,  normal pedal pulses present  Psych-flat affect unchanged Neuro-generalized weakness no new focal deficits, no tremors  Data Reviewed: I have personally reviewed following labs and imaging studies  CBC: Recent  Labs  Lab 05/16/22 0309 05/17/22 0757 05/18/22 0300 05/19/22 0433 05/20/22 0501 05/20/22 2000 05/21/22 0456 05/21/22 1601 05/22/22 0346  WBC 15.3*   < > 20.2* 8.4 13.1*  --  15.4*  --  24.0*  NEUTROABS 12.6*  --   --   --   --   --   --   --   --   HGB 10.1*   < > 8.5* 7.1* 6.3* 7.6* 6.1* 10.5* 9.8*  HCT 30.6*   < > 26.2* 21.3* 18.7* 21.9* 17.5* 29.8* 27.9*  MCV 79.1*   < > 81.4 78.0* 77.6*  --  78.8*  --  81.6  PLT 254   < > 157 135* 129*  --  109*  --  134*   < > = values in this interval not displayed.   Basic Metabolic Panel: Recent Labs  Lab 05/16/22 0711 05/17/22 0402 05/18/22 0300 05/19/22 0433 05/20/22 0501 05/21/22 0456 05/22/22 0346 05/22/22 1039  NA  --    < > 142 139 138 135 135  --   K  --    < > 3.9 3.4* 3.2* 3.0* 2.6*  --   CL  --    < > 112* 105 106 101 99  --   CO2  --    < > 8* 17* 15* 22 25  --   GLUCOSE  --    < > 57* 100* 141* 120* 96  --   BUN  --    < > 118* 85* 113* 78* 47*  --   CREATININE  --    < > 8.12* 5.57* 5.92* 4.03* 2.73*  --   CALCIUM  --    < > 7.2* 7.0* 6.8* 6.8* 7.2*  --   MG 2.6*  --   --   --  2.2  --   --  1.7  PHOS 11.9*  --  12.8* 8.4* 8.8* 5.2* 3.6  --    < > = values in this interval not displayed.   GFR: Estimated Creatinine Clearance: 12.7 mL/min (A) (by C-G formula based on SCr of 2.73 mg/dL (H)). Liver Function Tests: Recent Labs  Lab 05/16/22 0309 05/17/22 0402 05/18/22 0300 05/19/22 0433 05/20/22 0501 05/21/22 0456 05/22/22 0346  AST 23 38  --   --   --   --   --  ALT 28 38  --   --   --   --   --   ALKPHOS 163* 260*  --   --   --   --   --   BILITOT 0.9 0.8  --   --   --   --   --   PROT 6.4* 5.8*  --   --   --   --   --   ALBUMIN 2.8* 2.6* 2.4* 2.2* 2.1* 1.9* 2.1*   Recent Results (from the past 240 hour(s))  Resp panel by RT-PCR (RSV, Flu A&B, Covid) Anterior Nasal Swab     Status: None   Collection Time: 05/16/22  3:33 AM   Specimen: Anterior Nasal Swab  Result Value Ref Range Status   SARS  Coronavirus 2 by RT PCR NEGATIVE NEGATIVE Final    Comment: (NOTE) SARS-CoV-2 target nucleic acids are NOT DETECTED.  The SARS-CoV-2 RNA is generally detectable in upper respiratory specimens during the acute phase of infection. The lowest concentration of SARS-CoV-2 viral copies this assay can detect is 138 copies/mL. A negative result does not preclude SARS-Cov-2 infection and should not be used as the sole basis for treatment or other patient management decisions. A negative result may occur with  improper specimen collection/handling, submission of specimen other than nasopharyngeal swab, presence of viral mutation(s) within the areas targeted by this assay, and inadequate number of viral copies(<138 copies/mL). A negative result must be combined with clinical observations, patient history, and epidemiological information. The expected result is Negative.  Fact Sheet for Patients:  EntrepreneurPulse.com.au  Fact Sheet for Healthcare Providers:  IncredibleEmployment.be  This test is no t yet approved or cleared by the [place] FDA and  has been authorized for detection and/or diagnosis of SARS-CoV-2 by FDA under an Emergency Use Authorization (EUA). This EUA will remain  in effect (meaning this test can be used) for the duration of the COVID-19 declaration under Section 564(b)(1) of the Act, 21 U.S.C.section 360bbb-3(b)(1), unless the authorization is terminated  or revoked sooner.       Influenza A by PCR NEGATIVE NEGATIVE Final   Influenza B by PCR NEGATIVE NEGATIVE Final    Comment: (NOTE) The Xpert Xpress SARS-CoV-2/FLU/RSV plus assay is intended as an aid in the diagnosis of influenza from Nasopharyngeal swab specimens and should not be used as a sole basis for treatment. Nasal washings and aspirates are unacceptable for Xpert Xpress SARS-CoV-2/FLU/RSV testing.  Fact Sheet for  Patients: EntrepreneurPulse.com.au  Fact Sheet for Healthcare Providers: IncredibleEmployment.be  This test is not yet approved or cleared by the [place] FDA and has been authorized for detection and/or diagnosis of SARS-CoV-2 by FDA under an Emergency Use Authorization (EUA). This EUA will remain in effect (meaning this test can be used) for the duration of the COVID-19 declaration under Section 564(b)(1) of the Act, 21 U.S.C. section 360bbb-3(b)(1), unless the authorization is terminated or revoked.     Resp Syncytial Virus by PCR NEGATIVE NEGATIVE Final    Comment: (NOTE) Fact Sheet for Patients: EntrepreneurPulse.com.au  Fact Sheet for Healthcare Providers: IncredibleEmployment.be  This test is not yet approved or cleared by the [place] FDA and has been authorized for detection and/or diagnosis of SARS-CoV-2 by FDA under an Emergency Use Authorization (EUA). This EUA will remain in effect (meaning this test can be used) for the duration of the COVID-19 declaration under Section 564(b)(1) of the Act, 21 U.S.C. section 360bbb-3(b)(1), unless the authorization is terminated or revoked.  Performed at [facility], [address], [place], [place] [address]   Culture, blood (routine x 2) Call MD if unable to obtain prior to antibiotics being given     Status: None   Collection Time: 05/16/22  6:44 AM   Specimen: BLOOD RIGHT FOREARM  Result Value Ref Range Status   Specimen Description   Final    BLOOD RIGHT FOREARM BOTTLES DRAWN AEROBIC AND ANAEROBIC   Special Requests Blood Culture adequate volume  Final   Culture   Final    NO GROWTH 5 DAYS Performed at [facility], [address], [place], [place] [address]    Report Status 05/21/2022 FINAL  Final  Culture, blood (routine x 2) Call MD if unable to obtain prior to antibiotics being given     Status: None   Collection Time: 05/16/22  7:11 AM    Specimen: BLOOD LEFT ARM  Result Value Ref Range Status   Specimen Description BLOOD LEFT ARM BOTTLES DRAWN AEROBIC AND ANAEROBIC  Final   Special Requests Blood Culture adequate volume  Final   Culture   Final    NO GROWTH 5 DAYS Performed at [facility], [address] [place], [place] [address]    Report Status 05/21/2022 FINAL  Final  MRSA Next Gen by PCR, Nasal     Status: None   Collection Time: 05/16/22 12:10 PM   Specimen: Nasal Mucosa; Nasal Swab  Result Value Ref Range Status   MRSA by PCR Next Gen NOT DETECTED NOT DETECTED Final    Comment: (NOTE) The GeneXpert MRSA Assay (FDA approved for NASAL specimens only), is one component of a comprehensive MRSA colonization surveillance program. It is not intended to diagnose MRSA infection nor to guide or monitor treatment for MRSA infections. Test performance is not FDA approved in patients less than 73 years old. Performed at [facility], [address] [place], [place] [address]   C Difficile Quick Screen w PCR reflex     Status: None   Collection Time: 05/16/22 12:10 PM   Specimen: STOOL  Result Value Ref Range Status   C Diff antigen NEGATIVE NEGATIVE Final   C Diff toxin NEGATIVE NEGATIVE Final   C Diff interpretation No C. difficile detected.  Final    Comment: Performed at [facility], [address] [place],  [address]  Stool culture     Status: None   Collection Time: 05/16/22 12:10 PM   Specimen: Stool  Result Value Ref Range Status   Salmonella/Shigella Screen Final report  Final   Campylobacter Culture Final report  Final   E coli, Shiga toxin Assay Negative Negative Final    Comment: (NOTE) Performed At: [facility] [place], [place] [id number] Rush Farmer MD [id number]   Gastrointestinal Panel by PCR , Stool     Status: None   Collection Time: 05/16/22 12:10 PM   Specimen: Stool  Result Value Ref Range Status   Campylobacter species NOT DETECTED NOT DETECTED  Final   Plesimonas shigelloides NOT DETECTED NOT DETECTED Final   Salmonella species NOT DETECTED NOT DETECTED Final   Yersinia enterocolitica NOT DETECTED NOT DETECTED Final   Vibrio species NOT DETECTED NOT DETECTED Final   Vibrio cholerae NOT DETECTED NOT DETECTED Final   Enteroaggregative E coli (EAEC) NOT DETECTED NOT DETECTED Final   Enteropathogenic E coli (EPEC) NOT DETECTED NOT DETECTED Final   Enterotoxigenic E coli (ETEC) NOT DETECTED NOT DETECTED Final   Shiga like toxin producing E  coli (STEC) NOT DETECTED NOT DETECTED Final   Shigella/Enteroinvasive E coli (EIEC) NOT DETECTED NOT DETECTED Final   Cryptosporidium NOT DETECTED NOT DETECTED Final   Cyclospora cayetanensis NOT DETECTED NOT DETECTED Final   Entamoeba histolytica NOT DETECTED NOT DETECTED Final   Giardia lamblia NOT DETECTED NOT DETECTED Final   Adenovirus F40/41 NOT DETECTED NOT DETECTED Final   Astrovirus NOT DETECTED NOT DETECTED Final   Norovirus GI/GII NOT DETECTED NOT DETECTED Final   Rotavirus A NOT DETECTED NOT DETECTED Final   Sapovirus (I, II, IV, and V) NOT DETECTED NOT DETECTED Final    Comment: Performed at [facility], [place] [place], [place] [address]  STOOL CULTURE REFLEX - RSASHR     Status: None   Collection Time: 05/16/22 12:10 PM  Result Value Ref Range Status   Stool Culture result 1 (RSASHR) Comment  Final    Comment: (NOTE) No Salmonella or Shigella recovered. Performed At: [facility] [address] [place], [place] [id number] Rush Farmer MD [id number]   STOOL CULTURE Reflex - CMPCXR     Status: None   Collection Time: 05/16/22 12:10 PM  Result Value Ref Range Status   Stool Culture result 1 (CMPCXR) Comment  Final    Comment: (NOTE) No Campylobacter species isolated. Performed At: [facility] [place], [place] [id number] Rush Farmer MD [id number]      Radiology Studies: DG CHEST PORT 1 VIEW  Result Date:  05/21/2022 CLINICAL DATA:  Leukocytosis EXAM: PORTABLE CHEST 1 VIEW COMPARISON:  05/18/2022 FINDINGS: Central venous line tip in distal SVC. Normal cardiac silhouette. Small RIGHT effusion. Mild venous congestion. No pneumothorax. No focal consolidation. Lymphadenectomy clips in the LEFT axilla. No significant interval change. IMPRESSION: 1. No significant interval change. 2. Small RIGHT effusion. 3. Mild venous congestion. Electronically Signed   By: Suzy Bouchard M.D.   On: 05/21/2022 08:22    Scheduled Meds:  sodium chloride   Intravenous Once   amLODipine  10 mg Oral Daily   calcium acetate  667 mg Oral TID WC   Chlorhexidine Gluconate Cloth  6 each Topical Daily   Chlorhexidine Gluconate Cloth  6 each Topical Q0600   darbepoetin (ARANESP) injection - DIALYSIS  150 mcg Subcutaneous Q Tue-1800   dextromethorphan-guaiFENesin  1 tablet Oral BID   feeding supplement  237 mL Oral BID BM   hydrALAZINE  50 mg Oral TID   pantoprazole  40 mg Oral Daily   predniSONE  60 mg Oral Q breakfast   sodium bicarbonate  1,300 mg Oral BID   Continuous Infusions:  magnesium sulfate bolus IVPB     Critical Care Procedure Note Authorized and Performed by: Murvin Natal MD  Total Critical Care time:  44 mins Due to a high probability of clinically significant, life threatening deterioration, the patient required my highest level of preparedness to intervene emergently and I personally spent this critical care time directly and personally managing the patient.  This critical care time included obtaining a history; examining the patient, pulse oximetry; ordering and review of studies; arranging urgent treatment with development of a management plan; evaluation of patient's response of treatment; frequent reassessment; and discussions with other providers.  This critical care time was performed to assess and manage the high probability of imminent and life threatening deterioration that could result in multi-organ  failure.  It was exclusive of separately billable procedures and treating other patients and teaching time.     LOS: 6 days  Irwin Brakeman M.D on 05/22/2022 at 11:12 AM  Go to [email or web address] - for contact info  [facility] - Office  [phone]  If 7PM-7AM, please contact night-coverage [email or web address] 05/22/2022, 11:12 AM

## 2022-05-22 NOTE — Progress Notes (Signed)
We will proceed with colonoscopy and possible EGD as scheduled.  I thoroughly discussed with the patient the procedure, including the risks involved. Patient understands what the procedure involves including the benefits and any risks. Patient understands alternatives to the proposed procedure. Risks including (but not limited to) bleeding, tearing of the lining (perforation), rupture of adjacent organs, problems with heart and lung function, infection, and medication reactions. A small percentage of complications may require surgery, hospitalization, repeat endoscopic procedure, and/or transfusion.  Patient understood and agreed.  Maylon Peppers, MD Gastroenterology and Hepatology [facility]

## 2022-05-22 NOTE — Progress Notes (Signed)
   Nephrology Nursing Note:  Pt had HD on Tuesday, Thursday, and Friday this week.  BUN 47, Cr 2.73, K 3.0, Hgb 9.8.  She is  s/p EGD.  Case discussed with Dr. Madelon Lips.  High inpatient HD census today. We will r/s today's session for tomorrow to better transition to TTS schedule starting next week.  Rockwell Alexandria, RN AP KDU

## 2022-05-22 NOTE — Op Note (Signed)
[facility] Patient Name: Sheri Cooper Procedure Date: 05/22/2022 11:18 AM MRN: [id number] Date of Birth: 12-01-1950 Attending MD: Maylon Peppers , , [id number] CSN: [id number] Age: 72 Admit Type: Inpatient Procedure:                Upper GI endoscopy Indications:              Melena Providers:                Maylon Peppers, Lurline Del, RN, Casimer Bilis, Technician Referring MD:              Medicines:                Monitored Anesthesia Care Complications:            No immediate complications. Estimated Blood Loss:     Estimated blood loss: none. Procedure:                Pre-Anesthesia Assessment:                           - Prior to the procedure, a History and Physical                            was performed, and patient medications, allergies                            and sensitivities were reviewed. The patient's                            tolerance of previous anesthesia was reviewed.                           - The risks and benefits of the procedure and the                            sedation options and risks were discussed with the                            patient. All questions were answered and informed                            consent was obtained.                           - ASA Grade Assessment: III - A patient with severe                            systemic disease.                           After obtaining informed consent, the endoscope was                            passed under direct vision. Throughout the  procedure, the patient's blood pressure, pulse, and                            oxygen saturations were monitored continuously. The                            [id number]-[id number] ([id number]) scope was introduced through the                            mouth, and advanced to the second part of duodenum.                            The upper GI endoscopy was accomplished without                             difficulty. The patient tolerated the procedure                            well. Scope In: 11:55:31 AM Scope Out: 12:21:19 PM Total Procedure Duration: 0 hours 25 minutes 48 seconds  Findings:      LA Grade C (one or more mucosal breaks continuous between tops of 2 or       more mucosal folds, less than 75% circumference) esophagitis with no       bleeding was found at the gastroesophageal junction.      Diffuse moderate inflammation characterized by congestion (edema) and       erythema was found in the gastric antrum.      Two non-bleeding cratered gastric ulcers with a flat pigmented spot       (Forrest Class IIc) were found in the gastric fundus. The largest lesion       was 6 mm in largest dimension.      Three non-bleeding cratered duodenal ulcers with a nonbleeding visible       vessel in the largest ulcer (Forrest Class IIa) were found in the first       portion of the duodenum. There was a large adhered clot was large. The       largest lesion was 15 mm in largest dimension. Area was successfully       injected with 6 mL of a 0.1 mg/mL solution of epinephrine for       hemostasis. I attempted to remove the large clot to visualize the       underlying tissue, there was a large vessel at the base of the clot,       which showed ongoing arterial bleeding after removal of the clot. To       stop active bleeding at the veseel area, one hemostatic clip was       successfully placed. Clip manufacturer: [employer]. Coagulation       for hemostasis using bipolar probe was successful. c      Five non-bleeding cratered duodenal ulcers with an adherent clot       (Forrest Class IIb) were found in the second portion of the duodenum.       The largest lesion was 12 mm in largest dimension. Area was successfully       injected with 5 mL of a 0.1 mg/mL solution of epinephrine for prevention  of bleedin after removal of clot. For hemostasis, one hemostatic clip       was  deployed but fell off the mucosa. Coagulation for bleeding       prevention of an arterial vessel located below the clot using bipolar       probe was successful.      NOTE: colonoscopy pictures are attached to this report due to a problem       with picture migration Impression:               - LA Grade C esophagitis with no bleeding.                           - Gastritis.                           - Non-bleeding gastric ulcers with a flat pigmented                            spot (Forrest Class IIc).                           - Non-bleeding duodenal ulcers with a nonbleeding                            visible vessel x1 (Forrest Class IIa). Injected.                            Clip was placed. Clip manufacturer: [employer]. Treated with bipolar cautery.                           - Non-bleeding duodenal ulcers with an adherent                            clot x1 (Forrest Class IIb). Injected. Clip was                            deployef but fell off. Treated with bipolar cautery.                           - No specimens collected. Moderate Sedation:      Per Anesthesia Care Recommendation:           - Return patient to ICU for ongoing care.                           - Clear liquid diet today.                           - Pantoprazole drip for 72 hours, will need to                            switch to BID upon discharge.                           -  Sucralfate 1 g every 6 hours.                           - Avoid NSAIDs.                           - Repeat EGD in 2 months.                           - If patient presents recurrent clinical bleeding                            or hemodynamic instability, perform CT Angio bleed                            protocol STAT - consult IR if acrtive bleeding is                            identified. Procedure Code(s):        --- Professional ---                           [id number], Esophagogastroduodenoscopy, flexible,                             transoral; with control of bleeding, any method Diagnosis Code(s):        --- Professional ---                           K20.90, Esophagitis, unspecified without bleeding                           K29.70, Gastritis, unspecified, without bleeding                           K25.9, Gastric ulcer, unspecified as acute or                            chronic, without hemorrhage or perforation                           K26.4, Chronic or unspecified duodenal ulcer with                            hemorrhage                           K92.1, Melena (includes Hematochezia) CPT copyright 2022 American Medical Association. All rights reserved. The codes documented in this report are preliminary and upon coder review may  be revised to meet current compliance requirements. Maylon Peppers, MD Maylon Peppers,  05/22/2022 12:42:46 PM This report has been signed electronically. Number of Addenda: 0

## 2022-05-22 NOTE — Anesthesia Preprocedure Evaluation (Signed)
Anesthesia Evaluation  Patient identified by MRN, date of birth, ID band Patient awake    Reviewed: Allergy & Precautions, H&P , NPO status , Patient's Chart, lab work & pertinent test results, reviewed documented beta blocker date and time   Airway Mallampati: II  TM Distance: >3 FB Neck ROM: full    Dental no notable dental hx.    Pulmonary neg pulmonary ROS, pneumonia, COPD, former smoker   Pulmonary exam normal breath sounds clear to auscultation       Cardiovascular Exercise Tolerance: Good negative cardio ROS  Rhythm:regular Rate:Normal     Neuro/Psych negative neurological ROS  negative psych ROS   GI/Hepatic negative GI ROS, Neg liver ROS,,,  Endo/Other  negative endocrine ROS    Renal/GU ESRF and DialysisRenal diseasenegative Renal ROS  negative genitourinary   Musculoskeletal   Abdominal   Peds  Hematology negative hematology ROS (+) Blood dyscrasia, anemia   Anesthesia Other Findings   Reproductive/Obstetrics negative OB ROS                             Anesthesia Physical Anesthesia Plan  ASA: 4 and emergent  Anesthesia Plan: General   Post-op Pain Management:    Induction:   PONV Risk Score and Plan: Propofol infusion  Airway Management Planned:   Additional Equipment:   Intra-op Plan:   Post-operative Plan:   Informed Consent: I have reviewed the patients History and Physical, chart, labs and discussed the procedure including the risks, benefits and alternatives for the proposed anesthesia with the patient or authorized representative who has indicated his/her understanding and acceptance.     Dental Advisory Given  Plan Discussed with: CRNA  Anesthesia Plan Comments:        Anesthesia Quick Evaluation

## 2022-05-22 NOTE — Op Note (Addendum)
[facility] Patient Name: Sheri Cooper Procedure Date: 05/22/2022 11:15 AM MRN: [id number] Date of Birth: Sep 16, 1950 Attending MD: Maylon Peppers , , [id number] CSN: [id number] Age: 72 Admit Type: Outpatient Procedure:                Colonoscopy Indications:              Melena, Rectal bleeding Providers:                Maylon Peppers, Lurline Del, RN Referring MD:              Medicines:                Monitored Anesthesia Care Complications:            No immediate complications. Estimated Blood Loss:     Estimated blood loss: none. Procedure:                Pre-Anesthesia Assessment:                           - Prior to the procedure, a History and Physical                            was performed, and patient medications, allergies                            and sensitivities were reviewed. The patient's                            tolerance of previous anesthesia was reviewed.                           - The risks and benefits of the procedure and the                            sedation options and risks were discussed with the                            patient. All questions were answered and informed                            consent was obtained.                           - ASA Grade Assessment: III - A patient with severe                            systemic disease.                           After obtaining informed consent, the colonoscope                            was passed under direct vision. Throughout the                            procedure, the patient's blood pressure, pulse, and  oxygen saturations were monitored continuously. The                            [id number]-HQ190L ([id number]) scope was introduced through                            the anus and advanced to the the terminal ileum.                            The colonoscopy was performed without difficulty.                            The patient tolerated the procedure well.  The                            quality of the bowel preparation was fair. Findings:      The perianal and digital rectal examinations were normal.      The terminal ileum contained melena (altered blood/coffee-ground-like       material). No fresh blood was seen.      Melena (altered blood/coffee-ground-like material) was found in the       entire colon. I attempted to do a thorough evaluation of the colon as       thorough as possible but given bowel prep there are areas that could       have small polyps or lesions. No large masses or overt bleeding was seen.      The retroflexed view of the distal rectum and anal verge was normal and       showed no anal or rectal abnormalities. Impression:               - Preparation of the colon was fair.                           - Blood in the terminal ileum.                           - Blood in the entire examined colon.                           - The distal rectum and anal verge are normal on                            retroflexion view.                           - No specimens collected. Moderate Sedation:      Per Anesthesia Care Recommendation:           - Proceed with EGD.                           - Repeat colonoscopy is not recommended due to                            current age (63 years or older) for screening  purposes. Procedure Code(s):        --- Professional ---                           [phone], Colonoscopy, flexible; diagnostic, including                            collection of specimen(s) by brushing or washing,                            when performed (separate procedure) Diagnosis Code(s):        --- Professional ---                           K92.2, Gastrointestinal hemorrhage, unspecified                           K92.1, Melena (includes Hematochezia)                           K62.5, Hemorrhage of anus and rectum CPT copyright 2022 American Medical Association. All rights reserved. The codes  documented in this report are preliminary and upon coder review may  be revised to meet current compliance requirements. Maylon Peppers, MD Maylon Peppers,  05/22/2022 11:52:58 AM This report has been signed electronically. Number of Addenda: 0

## 2022-05-22 NOTE — Anesthesia Postprocedure Evaluation (Signed)
Anesthesia Post Note  Patient: Sheri Cooper  Procedure(s) Performed: COLONOSCOPY WITH PROPOFOL ESOPHAGOGASTRODUODENOSCOPY (EGD) WITH PROPOFOL  Patient location during evaluation: PACU Anesthesia Type: General Level of consciousness: awake and alert Pain management: pain level controlled Vital Signs Assessment: post-procedure vital signs reviewed and stable Respiratory status: spontaneous breathing, nonlabored ventilation, respiratory function stable and patient connected to nasal cannula oxygen Cardiovascular status: blood pressure returned to baseline and stable Postop Assessment: no apparent nausea or vomiting Anesthetic complications: no   No notable events documented.   Last Vitals:  Vitals:   05/22/22 1230 05/22/22 1245  BP: (!) 139/56 (!) 154/62  Pulse: 79 64  Resp: (!) 9   Temp:    SpO2: 97% 96%    Last Pain:  Vitals:   05/22/22 1245  TempSrc:   PainSc: 10-Worst pain ever                 Louann Sjogren

## 2022-05-23 DIAGNOSIS — K921 Melena: Secondary | ICD-10-CM | POA: Diagnosis not present

## 2022-05-23 DIAGNOSIS — T68XXXD Hypothermia, subsequent encounter: Secondary | ICD-10-CM | POA: Diagnosis not present

## 2022-05-23 DIAGNOSIS — J189 Pneumonia, unspecified organism: Secondary | ICD-10-CM | POA: Diagnosis not present

## 2022-05-23 DIAGNOSIS — K625 Hemorrhage of anus and rectum: Secondary | ICD-10-CM | POA: Diagnosis not present

## 2022-05-23 DIAGNOSIS — D500 Iron deficiency anemia secondary to blood loss (chronic): Secondary | ICD-10-CM | POA: Diagnosis not present

## 2022-05-23 DIAGNOSIS — N185 Chronic kidney disease, stage 5: Secondary | ICD-10-CM | POA: Diagnosis not present

## 2022-05-23 DIAGNOSIS — K922 Gastrointestinal hemorrhage, unspecified: Secondary | ICD-10-CM | POA: Diagnosis not present

## 2022-05-23 LAB — MAGNESIUM: Magnesium: 2.2 mg/dL (ref 1.7–2.4)

## 2022-05-23 LAB — RENAL FUNCTION PANEL
Albumin: 2.1 g/dL — ABNORMAL LOW (ref 3.5–5.0)
Anion gap: 10 (ref 5–15)
BUN: 69 mg/dL — ABNORMAL HIGH (ref 8–23)
CO2: 24 mmol/L (ref 22–32)
Calcium: 7.4 mg/dL — ABNORMAL LOW (ref 8.9–10.3)
Chloride: 101 mmol/L (ref 98–111)
Creatinine, Ser: 3.54 mg/dL — ABNORMAL HIGH (ref 0.44–1.00)
GFR, Estimated: 13 mL/min — ABNORMAL LOW (ref >60)
Glucose, Bld: 108 mg/dL — ABNORMAL HIGH (ref 70–99)
Phosphorus: 4.5 mg/dL (ref 2.5–4.6)
Potassium: 3.6 mmol/L (ref 3.5–5.1)
Sodium: 135 mmol/L (ref 135–145)

## 2022-05-23 LAB — CBC
HCT: 25.4 % — ABNORMAL LOW (ref 36.0–46.0)
Hemoglobin: 8.7 g/dL — ABNORMAL LOW (ref 12.0–15.0)
MCH: 28.3 pg (ref 26.0–34.0)
MCHC: 34.3 g/dL (ref 30.0–36.0)
MCV: 82.7 fL (ref 80.0–100.0)
Platelets: 145 10*3/uL — ABNORMAL LOW (ref 150–400)
RBC: 3.07 MIL/uL — ABNORMAL LOW (ref 3.87–5.11)
RDW: 16.6 % — ABNORMAL HIGH (ref 11.5–15.5)
WBC: 20.1 10*3/uL — ABNORMAL HIGH (ref 4.0–10.5)
nRBC: 0.4 % — ABNORMAL HIGH (ref 0.0–0.2)

## 2022-05-23 LAB — HEPARIN INDUCED PLATELET AB (HIT ANTIBODY): Heparin Induced Plt Ab: 0.072 OD (ref 0.000–0.400)

## 2022-05-23 MED ORDER — ALBUMIN HUMAN 25 % IV SOLN: 25.0000 g | INTRAVENOUS | Status: DC | PRN

## 2022-05-23 NOTE — Progress Notes (Addendum)
Sheri Cooper, M.D. Gastroenterology & Hepatology   Interval History:  Patient reports feeling well and denies having any complaints.  States that she still has some epigastric pain but is much better compared to yesterday.  Was able to tolerate a renal soft diet today in the morning. Had 1 episode of melena yesterday night but no nausea, vomiting, hematochezia or hematemesis. Chest x-ray and abdominal x-ray were negative for viscus perforation  Inpatient Medications:  Current Facility-Administered Medications:    0.9 %  sodium chloride infusion (Manually program via Guardrails IV Fluids), , Intravenous, Once, Murlean Iba, MD, Held at 05/21/22 3532   acetaminophen (TYLENOL) tablet 650 mg, 650 mg, Oral, Q6H PRN **OR** acetaminophen (TYLENOL) suppository 650 mg, 650 mg, Rectal, Q6H PRN, Adefeso, Oladapo, DO   alteplase (CATHFLO ACTIVASE) injection 2 mg, 2 mg, Intracatheter, Once PRN, Corliss Parish, MD   amLODipine (NORVASC) tablet 10 mg, 10 mg, Oral, Daily, Emokpae, Courage, MD, 10 mg at 05/21/22 0842   calcium acetate (PHOSLO) capsule 667 mg, 667 mg, Oral, TID WC, Gean Quint, MD, 667 mg at 05/23/22 9924   Chlorhexidine Gluconate Cloth 2 % PADS 6 each, 6 each, Topical, Daily, Emokpae, Courage, MD, 6 each at 05/22/22 1519   Chlorhexidine Gluconate Cloth 2 % PADS 6 each, 6 each, Topical, Q0600, Corliss Parish, MD, 6 each at 05/23/22 0535   Darbepoetin Alfa (ARANESP) injection 150 mcg, 150 mcg, Subcutaneous, Q Tue-1800, Corliss Parish, MD, 150 mcg at 05/18/22 1828   dextromethorphan-guaiFENesin ([place] DM) 30-600 MG per 12 hr tablet 1 tablet, 1 tablet, Oral, BID, Adefeso, Oladapo, DO, 1 tablet at 05/22/22 2121   feeding supplement (ENSURE ENLIVE / ENSURE PLUS) liquid 237 mL, 237 mL, Oral, BID BM, Adefeso, Oladapo, DO, 237 mL at 05/18/22 1449   fentaNYL (SUBLIMAZE) injection 50 mcg, 50 mcg, Intravenous, PRN, Johnson, Clanford L, MD   heparin injection 1,000 Units,  1,000 Units, Intracatheter, PRN, Corliss Parish, MD, 2,600 Units at 05/20/22 1525   hydrALAZINE (APRESOLINE) tablet 50 mg, 50 mg, Oral, TID, Emokpae, Courage, MD, 50 mg at 05/22/22 2121   ipratropium-albuterol (DUONEB) 0.5-2.5 (3) MG/3ML nebulizer solution 3 mL, 3 mL, Nebulization, Q4H PRN, Adefeso, Oladapo, DO   labetalol (NORMODYNE) injection 10 mg, 10 mg, Intravenous, Q4H PRN, Emokpae, Courage, MD, 10 mg at 05/23/22 0111   lactated ringers infusion, , Intravenous, Continuous, Kiel, Coralie Keens, MD, Last Rate: 10 mL/hr at 05/22/22 1703, New Bag at 05/22/22 1703   loperamide (IMODIUM) capsule 2 mg, 2 mg, Oral, Q6H PRN, Denton Brick, Courage, MD, 2 mg at 05/21/22 0545   oxyCODONE (Oxy IR/ROXICODONE) immediate release tablet 5 mg, 5 mg, Oral, Q8H PRN, Emokpae, Courage, MD, 5 mg at 05/23/22 0823   [START ON 05/26/2022] pantoprazole (PROTONIX) injection 40 mg, 40 mg, Intravenous, Q12H, Montez Morita, Jamala Kohen, MD   pantoprozole (PROTONIX) 80 mg /NS 100 mL infusion, 8 mg/hr, Intravenous, Continuous, Montez Morita, Braelyn Jenson, MD, Last Rate: 10 mL/hr at 05/23/22 0325, 8 mg/hr at 05/23/22 0325   predniSONE (DELTASONE) tablet 60 mg, 60 mg, Oral, Q breakfast, Gean Quint, MD, 60 mg at 05/23/22 2683   prochlorperazine (COMPAZINE) injection 10 mg, 10 mg, Intravenous, Q6H PRN, Adefeso, Oladapo, DO, 10 mg at 05/18/22 4196   sodium bicarbonate tablet 1,300 mg, 1,300 mg, Oral, BID, Emokpae, Courage, MD, 1,300 mg at 05/22/22 2122   sucralfate (CARAFATE) 1 GM/10ML suspension 1 g, 1 g, Oral, TID WC & HS, Harvel Quale, MD, 1 g at 05/23/22 2229   I/O  Intake/Output Summary (Last 24 hours) at 05/23/2022 0931 Last data filed at 05/23/2022 0745 Gross per 24 hour  Intake 864 ml  Output 0 ml  Net 864 ml     Physical Exam: Temp:  [97.6 F (36.4 C)-98.6 F (37 C)] 98.4 F (36.9 C) (01/21 0740) Pulse Rate:  [64-87] 79 (01/21 0740) Resp:  [9-22] 17 (01/21 0425) BP: (139-189)/(35-97) 143/37 (01/21  0700) SpO2:  [93 %-98 %] 96 % (01/21 0740)  Temp (24hrs), Avg:98.1 F (36.7 C), Min:97.6 F (36.4 C), Max:98.6 F (37 C) GENERAL: The patient is AO x3, in no acute distress. HEENT: Head is normocephalic and atraumatic. EOMI are intact. Mouth is well hydrated and without lesions. NECK: Supple. No masses.  Has a dialysis catheter in the right side of her neck. LUNGS: Clear to auscultation. No presence of rhonchi/wheezing/rales. Adequate chest expansion HEART: RRR, normal s1 and s2. ABDOMEN: Soft, nontender, no guarding, no peritoneal signs, and nondistended. BS +. No masses. EXTREMITIES: Without any cyanosis, clubbing, rash, lesions or edema. NEUROLOGIC: AOx3, no focal motor deficit. SKIN: no jaundice, no rashes  Laboratory Data: CBC:     Component Value Date/Time   WBC 20.1 (H) 05/23/2022 0414   RBC 3.07 (L) 05/23/2022 0414   HGB 8.7 (L) 05/23/2022 0414   HCT 25.4 (L) 05/23/2022 0414   PLT 145 (L) 05/23/2022 0414   MCV 82.7 05/23/2022 0414   MCH 28.3 05/23/2022 0414   MCHC 34.3 05/23/2022 0414   RDW 16.6 (H) 05/23/2022 0414   LYMPHSABS 1.4 05/16/2022 0309   MONOABS 0.6 05/16/2022 0309   EOSABS 0.0 05/16/2022 0309   BASOSABS 0.1 05/16/2022 0309   COAG:  Lab Results  Component Value Date   INR 1.2 05/19/2022    BMP:     Latest Ref Rng & Units 05/23/2022    4:14 AM 05/22/2022    2:40 PM 05/22/2022    3:46 AM  BMP  Glucose 70 - 99 mg/dL 108   96   BUN 8 - 23 mg/dL 69   47   Creatinine 0.44 - 1.00 mg/dL 3.54   2.73   Sodium 135 - 145 mmol/L 135   135   Potassium 3.5 - 5.1 mmol/L 3.6  3.0  2.6   Chloride 98 - 111 mmol/L 101   99   CO2 22 - 32 mmol/L 24   25   Calcium 8.9 - 10.3 mg/dL 7.4   7.2     HEPATIC:     Latest Ref Rng & Units 05/23/2022    4:14 AM 05/22/2022    3:46 AM 05/21/2022    4:56 AM  Hepatic Function  Albumin 3.5 - 5.0 g/dL 2.1  2.1  1.9     CARDIAC: No results found for: "CKTOTAL", "CKMB", "CKMBINDEX", "TROPONINI"    Imaging: I personally reviewed  and interpreted the available labs, imaging and endoscopic files.   Assessment/Plan: 72 year old female with past medical history of COPD, RA, CKD stage 4, admitted to the hospital after presenting pneumonia and AKI on CKD. Gastroenterology was consulted for evaluation of rectal bleeding and worsening anemia.  The patient had several episodes of rectal bleeding which was followed by melena.  This was initially concerning for a lower gastrointestinal bleeding for which the patient was prepped for a colonoscopy.  Underwent a colonoscopy yesterday which showed presence of melena in the terminal ileum and colon but no large lesions were found.  Due to this, an EGD was performed which showed grade C esophagitis and  gastritis with some small nonbleeding ulcers in the fundus, there was presence of multiple duodenal ulcers with 2 of the ulcers presenting large clots adhered to them.  These were removed and 2 vessels were found.  One of the vessels presented bleeding which had to be treated with clip x 1, epinephrine injection and coagulation.  The second vessel could not be adequately clipped, it was injected with epinephrine and coagulated.  Since then, the patient has presented hemodynamic stability, although her hemoglobin dropped down to 8.7 today.  We will monitor her blood count for the next 24 hours but if she presents worsening anemia with persistent clinical bleeding, we will need to proceed with a CT angio bleed protocol.  For now we will continue with a PPI drip for the next 1 to 2 hours and she will also need to continue with Carafate 1 g every 6 hours.  -Repeat H&H every day, transfuse if hemoglobin is less than 7 g/dL. -Renal soft diet - Pantoprazole drip for 72 hours, will need to switch to BID upon discharge. - Sucralfate 1 g every 6 hours. - Avoid NSAIDs. - Repeat EGD in 2 months. - If patient presents recurrent clinical bleeding or hemodynamic instability, perform CT Angio bleed protocol STAT -  consult IR if acrtive bleeding is identified.  Sheri Peppers, MD Gastroenterology and Hepatology [facility]astroenter[facility]

## 2022-05-23 NOTE — Procedures (Signed)
   HEMODIALYSIS TREATMENT NOTE (HD#4):  Uneventful 3.5 hour heparin-free treatment completed using RIJ TDC.  Max Qb 300 for AP >-240.  Goal met: 2.2 liters removed without interruption in UF.  All blood was returned.   Rockwell Alexandria, RN AP KDU

## 2022-05-23 NOTE — Progress Notes (Signed)
Patient has x1 loose black stool. GI provider informs that black stool are to be expected for a some days. Patient was dialyzed today without any reported issues. Safey maintained. Patient mobilized at highest level of mobility. C/o pain x1 this morning. PRN oxycodone administered with positive effects noted. The call bell kept within reach. Will continue to monitor and endorse to oncoming nurse.

## 2022-05-23 NOTE — Progress Notes (Signed)
PROGRESS NOTE   Sheri Cooper, is a 72 y.o. female, DOB - 06/10/1950, KYH:[id number]  Admit date - 05/16/2022   Admitting Physician Bernadette Hoit, DO  Outpatient Primary MD for the patient is [facility], [facility]  LOS - 7  Chief Complaint  Patient presents with   Shortness of Breath      Brief Summary:-  72 y.o. female with medical history significant of COPD, rheumatoid arthritis, stage V CKD  Admitted on 05/16/2022 with healthcare associated pneumonia and worsening renal function with anion gap metabolic acidosis -Patient was recently discharged from [facility] on 05/12/2022 after treatment for COPD.  Pt was started on hemodialysis on 05/18/22 after temporary central line placement and had renal biopsy done on 05/19/22.     A/p HCAP-- TREATED  - recent hospitalization at [facility] as outlined above -COVID, flu and RSV negative -Leukocytosis and elevated procalcitonin noted WBC 15.3 >> 19.8 >>20.2 -completed last dose cefepime 05/20/21 -Bronchodilators and mucolytics as ordered    AKI on CKD 5--very poor urine output, significant metabolic acidosis noted, becoming more sleepy - now transitioned to hemodialysis  -Anion gap metabolic acidosis noted and sodium bicarbonate -patient sees Dr. Theador Hawthorne as outpatient---discussed with Dr. Lars Masson was concerned that patient would need hemodialysis this admission which has now been started -Care [facility] -Right IJ HD catheter placed by Dr. 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   -per renal team, continue steroids for now   COPD -continue bronchodilators -Pt is on high dose steroids per nephrology  for AKI  Leukocytosis  - I think this is a leukemoid reaction from high dose steroids that patient is on - we have not found any evidence of infection at this time  - we are following CBC  Hypokalemia -repleted -repleted low magnesium  Acute blood loss Anemia / Anemia in CKD -Ferritin and serum iron are not low -Hg down to 6.3, transfused 1 unit PRBC with HD on 1/18, had large bloody BM, Hg down to 6.1 1/19, transfuse 2 units PRBC with HD on 1/19  -recheck Hg improved to 9.8, now trended down to 8.7 after EGD -follow CBC daily; if recurrent GI bleed or signs of hemodynamic instability GI team recommends getting a CT angio bleed protocol STAT and to consult IR if active bleeding identified  Guaiac Positive Stools / GI bleeding Bleeding duodenal ulcers s/p clip x1, epinephrine and coagulation treatments Esophagitis and Gastritis  -large bloody BM noted by RN guaiac positive on 1/18 -DC'd subcutaneous heparin on 1/18 -transfused 1 unit PRBC with HD on 1/18, Hg down again to 6.1, transfused 2 unit PRBC 1/19 with HD  -GI team consulted to eval 1/19 -transfused 2 units PRBC with HD 1/19, Hg up to 9.8 -EGD / Colonoscopy planned for 05/22/22 with Dr. Jenetta Downer - with findings of grade C esophagitis and gastritis with ulcers, multiple duodenal ulcers with blood clots, bleeding vessel clip x 1, epinephrine injection given and coagulation.   Thrombocytopenia - resolved now - we stopped [place] heparin on 1/18 - checking HIT antibody labs - pending results - platelets improved to 145 after stopping all heparins  - "pharmacy on HIT alert" order placed    Incidental finding of multiple renal lesions CT  angiography of chest showed multiple renal lesions bilaterally, incompletely characterized on today's noncontrast CT examination, including an indeterminate 1.9 cm lesion in the anterior aspect of the interpolar region of the left kidney.  Follow-up nonemergent outpatient abdominal MRI with and without  IV gadolinium is recommended in the near future to definitively evaluate this lesion and exclude neoplasm   Elevated BNP--echo from 05/10/20[facility] at [facility] available in [place] reveals-  The left ventricle is normal in size with normal wall thickness. The left ventricular systolic function is normal, LVEF is visually estimated at 60-65%.  There is normal left ventricular diastolic function.  -There is mild to moderate pulmonary hypertension no significant valvular abnormalities -hemodialysis to manage volume status   Diarrhea - resolved now;  - c diff testing was negative   Procedures:- -Right IJ HD catheter placed on 05/18/22 -Initiated on hemodialysis on 05/18/2022 -completed IR renal biopsy on 05/19/2022 at [facility] -EGD/colonoscopy completed 05/22/2022   Status is: Inpatient   Disposition: The patient is from: Home              Anticipated d/c is to: Home --clipped to an outpatient HD center              Anticipated d/c date is: 3 days              Patient currently is not medically stable to d/c. Barriers: Not Clinically Stable-  Code Status :  -  Code Status: Full Code   Family Communication:    NA (patient is alert, awake and coherent)   DVT Prophylaxis  :   - SCDs   SCDs Start: 05/16/22 0644   Lab Results  Component Value Date   PLT 145 (L) 05/23/2022   Inpatient Medications  Scheduled Meds:  sodium chloride   Intravenous Once   amLODipine  10 mg Oral Daily   calcium acetate  667 mg Oral TID WC   Chlorhexidine Gluconate Cloth  6 each Topical Daily   Chlorhexidine Gluconate Cloth  6 each Topical Q0600   darbepoetin (ARANESP) injection - DIALYSIS  150 mcg Subcutaneous Q Tue-1800   dextromethorphan-guaiFENesin  1 tablet Oral BID   feeding supplement  237 mL Oral BID BM   hydrALAZINE  50 mg Oral TID   [START ON 05/26/2022] pantoprazole  40 mg Intravenous Q12H   predniSONE  60 mg Oral Q breakfast   sodium bicarbonate  1,300 mg Oral BID   sucralfate  1 g Oral TID  WC & HS   Continuous Infusions:  lactated ringers 10 mL/hr at 05/22/22 1703   pantoprazole 8 mg/hr (05/23/22 0325)   PRN Meds:.acetaminophen **OR** acetaminophen, alteplase, fentaNYL (SUBLIMAZE) injection, heparin, ipratropium-albuterol, labetalol, loperamide, oxyCODONE, prochlorperazine   Anti-infectives (From admission, onward)    Start     Dose/Rate Route Frequency Ordered Stop   05/17/22 0800  ceFEPIme (MAXIPIME) 1 g in sodium chloride 0.9 % 100 mL IVPB  Status:  Discontinued        1 g 200 mL/hr over 30 Minutes Intravenous Every 24 hours 05/16/22 0641 05/20/22 1012   05/16/22 0641  vancomycin variable dose per unstable renal function (pharmacist dosing)  Status:  Discontinued         Does not apply See admin instructions 05/16/22 0641 05/17/22 1605   05/16/22 0445  vancomycin (VANCOREADY) IVPB 1250 mg/250 mL        1,250 mg 166.7 mL/hr over 90 Minutes Intravenous  Once 05/16/22 0444 05/16/22 0732   05/16/22 0430  ceFEPIme (MAXIPIME) 2 g in sodium chloride 0.9 % 100 mL IVPB        2 g 200 mL/hr over 30 Minutes Intravenous  Once 05/16/22 0426 05/16/22 0534       Subjective: Sheri Cooper wanting to advance diet today. Had a black stool.       Objective: Vitals:   05/23/22 0730 05/23/22 0740 05/23/22 1040 05/23/22 1210  BP:   (!) 165/59 (!) 173/47  Pulse: 70 79  72  Resp:    10  Temp:  98.4 F (36.9 C)  98.2 F (36.8 C)  TempSrc:    Oral  SpO2: 95% 96%  96%  Weight:      Height:        Intake/Output Summary (Last 24 hours) at 05/23/2022 1229 Last data filed at 05/23/2022 0745 Gross per 24 hour  Intake 664 ml  Output 0 ml  Net 664 ml   Filed Weights   05/21/22 1040 05/21/22 1335 05/22/22 0500  Weight: 53.3 kg 52 kg 50.8 kg    Physical Exam  Gen: awake, alert, cooperative, NAD; appears less pale today  HEENT: NCAT; pERRL, sclera white.  Neck-Supple Neck, right IJ hemodialysis catheter in place; no sign of infection seen Lungs-BBS shallow but clear to  auscultation   CV- normal s1, s2 sounds;   Abd-  normal BS, ND/NT, no HSM   Extremity/Skin:-  1+ edema,  normal pedal pulses present  Psych-flat affect unchanged Neuro-generalized weakness no new focal deficits, no tremors  Data Reviewed: I have personally reviewed following labs and imaging studies  CBC: Recent Labs  Lab 05/19/22 0433 05/20/22 0501 05/20/22 2000 05/21/22 0456 05/21/22 1601 05/22/22 0346 05/23/22 0414  WBC 8.4 13.1*  --  15.4*  --  24.0* 20.1*  HGB 7.1* 6.3* 7.6* 6.1* 10.5* 9.8* 8.7*  HCT 21.3* 18.7* 21.9* 17.5* 29.8* 27.9* 25.4*  MCV 78.0* 77.6*  --  78.8*  --  81.6 82.7  PLT 135* 129*  --  109*  --  134* 956*   Basic Metabolic Panel: Recent Labs  Lab 05/19/22 0433 05/20/22 0501 05/21/22 0456 05/22/22 0346 05/22/22 1039 05/22/22 1440 05/23/22 0414  NA 139 138 135 135  --   --  135  K 3.4* 3.2* 3.0* 2.6*  --  3.0* 3.6  CL 105 106 101 99  --   --  101  CO2 17* 15* 22 25  --   --  24  GLUCOSE 100* 141* 120* 96  --   --  108*  BUN 85* 113* 78* 47*  --   --  69*  CREATININE 5.57* 5.92* 4.03* 2.73*  --   --  3.54*  CALCIUM 7.0* 6.8* 6.8* 7.2*  --   --  7.4*  MG  --  2.2  --   --  1.7  --  2.2  PHOS 8.4* 8.8* 5.2* 3.6  --   --  4.5   GFR: Estimated Creatinine Clearance: 9.8 mL/min (A) (by C-G formula based on SCr of 3.54 mg/dL (H)). Liver Function Tests: Recent Labs  Lab 05/17/22 0402 05/18/22 0300 05/19/22 0433 05/20/22 0501 05/21/22 0456 05/22/22 0346 05/23/22 0414  AST 38  --   --   --   --   --   --   ALT 38  --   --   --   --   --   --   ALKPHOS 260*  --   --   --   --   --   --  BILITOT 0.8  --   --   --   --   --   --   PROT 5.8*  --   --   --   --   --   --   ALBUMIN 2.6*   < > 2.2* 2.1* 1.9* 2.1* 2.1*   < > = values in this interval not displayed.   Recent Results (from the past 240 hour(s))  Resp panel by RT-PCR (RSV, Flu A&B, Covid) Anterior Nasal Swab     Status: None   Collection Time: 05/16/22  3:33 AM   Specimen: Anterior  Nasal Swab  Result Value Ref Range Status   SARS Coronavirus 2 by RT PCR NEGATIVE NEGATIVE Final    Comment: (NOTE) SARS-CoV-2 target nucleic acids are NOT DETECTED.  The SARS-CoV-2 RNA is generally detectable in upper respiratory specimens during the acute phase of infection. The lowest concentration of SARS-CoV-2 viral copies this assay can detect is 138 copies/mL. A negative result does not preclude SARS-Cov-2 infection and should not be used as the sole basis for treatment or other patient management decisions. A negative result may occur with  improper specimen collection/handling, submission of specimen other than nasopharyngeal swab, presence of viral mutation(s) within the areas targeted by this assay, and inadequate number of viral copies(<138 copies/mL). A negative result must be combined with clinical observations, patient history, and epidemiological information. The expected result is Negative.  Fact Sheet for Patients:  EntrepreneurPulse.com.au  Fact Sheet for Healthcare Providers:  IncredibleEmployment.be  This test is no t yet approved or cleared by the [place] FDA and  has been authorized for detection and/or diagnosis of SARS-CoV-2 by FDA under an Emergency Use Authorization (EUA). This EUA will remain  in effect (meaning this test can be used) for the duration of the COVID-19 declaration under Section 564(b)(1) of the Act, 21 U.S.C.section 360bbb-3(b)(1), unless the authorization is terminated  or revoked sooner.       Influenza A by PCR NEGATIVE NEGATIVE Final   Influenza B by PCR NEGATIVE NEGATIVE Final    Comment: (NOTE) The Xpert Xpress SARS-CoV-2/FLU/RSV plus assay is intended as an aid in the diagnosis of influenza from Nasopharyngeal swab specimens and should not be used as a sole basis for treatment. Nasal washings and aspirates are unacceptable for Xpert Xpress SARS-CoV-2/FLU/RSV testing.  Fact Sheet for  Patients: EntrepreneurPulse.com.au  Fact Sheet for Healthcare Providers: IncredibleEmployment.be  This test is not yet approved or cleared by the [place] FDA and has been authorized for detection and/or diagnosis of SARS-CoV-2 by FDA under an Emergency Use Authorization (EUA). This EUA will remain in effect (meaning this test can be used) for the duration of the COVID-19 declaration under Section 564(b)(1) of the Act, 21 U.S.C. section 360bbb-3(b)(1), unless the authorization is terminated or revoked.     Resp Syncytial Virus by PCR NEGATIVE NEGATIVE Final    Comment: (NOTE) Fact Sheet for Patients: EntrepreneurPulse.com.au  Fact Sheet for Healthcare Providers: IncredibleEmployment.be  This test is not yet approved or cleared by the [place] FDA and has been authorized for detection and/or diagnosis of SARS-CoV-2 by FDA under an Emergency Use Authorization (EUA). This EUA will remain in effect (meaning this test can be used) for the duration of the COVID-19 declaration under Section 564(b)(1) of the Act, 21 U.S.C. section 360bbb-3(b)(1), unless the authorization is terminated or revoked.  Performed at [facility], [address], [place], [place] [address]   Culture, blood (routine x 2) Call MD  if unable to obtain prior to antibiotics being given     Status: None   Collection Time: 05/16/22  6:44 AM   Specimen: BLOOD RIGHT FOREARM  Result Value Ref Range Status   Specimen Description   Final    BLOOD RIGHT FOREARM BOTTLES DRAWN AEROBIC AND ANAEROBIC   Special Requests Blood Culture adequate volume  Final   Culture   Final    NO GROWTH 5 DAYS Performed at [facility], [address] [place], [place] [address]    Report Status 05/21/2022 FINAL  Final  Culture, blood (routine x 2) Call MD if unable to obtain prior to antibiotics being given     Status: None   Collection Time: 05/16/22  7:11 AM    Specimen: BLOOD LEFT ARM  Result Value Ref Range Status   Specimen Description BLOOD LEFT ARM BOTTLES DRAWN AEROBIC AND ANAEROBIC  Final   Special Requests Blood Culture adequate volume  Final   Culture   Final    NO GROWTH 5 DAYS Performed at [facility], [address], [place], [place] [address]    Report Status 05/21/2022 FINAL  Final  MRSA Next Gen by PCR, Nasal     Status: None   Collection Time: 05/16/22 12:10 PM   Specimen: Nasal Mucosa; Nasal Swab  Result Value Ref Range Status   MRSA by PCR Next Gen NOT DETECTED NOT DETECTED Final    Comment: (NOTE) The GeneXpert MRSA Assay (FDA approved for NASAL specimens only), is one component of a comprehensive MRSA colonization surveillance program. It is not intended to diagnose MRSA infection nor to guide or monitor treatment for MRSA infections. Test performance is not FDA approved in patients less than 56 years old. Performed at [facility], [address] [place], [place] [address]   C Difficile Quick Screen w PCR reflex     Status: None   Collection Time: 05/16/22 12:10 PM   Specimen: STOOL  Result Value Ref Range Status   C Diff antigen NEGATIVE NEGATIVE Final   C Diff toxin NEGATIVE NEGATIVE Final   C Diff interpretation No C. difficile detected.  Final    Comment: Performed at [facility], [address] [place], [place] [address]  Stool culture     Status: None   Collection Time: 05/16/22 12:10 PM   Specimen: Stool  Result Value Ref Range Status   Salmonella/Shigella Screen Final report  Final   Campylobacter Culture Final report  Final   E coli, Shiga toxin Assay Negative Negative Final    Comment: (NOTE) Performed At: [facility] [place], [place] [id number] Rush Farmer MD [id number]   Gastrointestinal Panel by PCR , Stool     Status: None   Collection Time: 05/16/22 12:10 PM   Specimen: Stool  Result Value Ref Range Status   Campylobacter species NOT DETECTED NOT DETECTED  Final   Plesimonas shigelloides NOT DETECTED NOT DETECTED Final   Salmonella species NOT DETECTED NOT DETECTED Final   Yersinia enterocolitica NOT DETECTED NOT DETECTED Final   Vibrio species NOT DETECTED NOT DETECTED Final   Vibrio cholerae NOT DETECTED NOT DETECTED Final   Enteroaggregative E coli (EAEC) NOT DETECTED NOT DETECTED Final   Enteropathogenic E coli (EPEC) NOT DETECTED NOT DETECTED Final   Enterotoxigenic E coli (ETEC) NOT DETECTED NOT DETECTED Final   Shiga like toxin producing E coli (STEC) NOT DETECTED NOT DETECTED Final   Shigella/Enteroinvasive E coli (EIEC) NOT DETECTED NOT DETECTED Final  Cryptosporidium NOT DETECTED NOT DETECTED Final   Cyclospora cayetanensis NOT DETECTED NOT DETECTED Final   Entamoeba histolytica NOT DETECTED NOT DETECTED Final   Giardia lamblia NOT DETECTED NOT DETECTED Final   Adenovirus F40/41 NOT DETECTED NOT DETECTED Final   Astrovirus NOT DETECTED NOT DETECTED Final   Norovirus GI/GII NOT DETECTED NOT DETECTED Final   Rotavirus A NOT DETECTED NOT DETECTED Final   Sapovirus (I, II, IV, and V) NOT DETECTED NOT DETECTED Final    Comment: Performed at [facility], [address] [place], [place] [address]  STOOL CULTURE REFLEX - RSASHR     Status: None   Collection Time: 05/16/22 12:10 PM  Result Value Ref Range Status   Stool Culture result 1 (RSASHR) Comment  Final    Comment: (NOTE) No Salmonella or Shigella recovered. Performed At: [facility] [address] [place], [place] [id number] Rush Farmer MD [id number]   STOOL CULTURE Reflex - CMPCXR     Status: None   Collection Time: 05/16/22 12:10 PM  Result Value Ref Range Status   Stool Culture result 1 (CMPCXR) Comment  Final    Comment: (NOTE) No Campylobacter species isolated. Performed At: [facility] [place], [place] [id number] Rush Farmer MD [id number]      Radiology Studies: DG CHEST PORT 1 VIEW  Result Date:  05/22/2022 CLINICAL DATA:  Evaluate for intra-abdominal free air. EXAM: PORTABLE CHEST 1 VIEW COMPARISON:  Abdominal radiograph 05/22/2022 FINDINGS: Central venous catheter tip projects over the superior vena cava. Stable cardiac and mediastinal contours. Small layering right pleural effusion with underlying consolidation. No pneumothorax. No definite free air no definite gas identified underlying the hemidiaphragms. IMPRESSION: 1. Small layering right pleural effusion with underlying consolidation. 2. No definite free air identified underlying the hemidiaphragms. Consider further evaluation with decubitus imaging of the abdomen if there is persistent clinical concern given the limitations of this portable semi-erect chest radiograph. Electronically Signed   By: Lovey Newcomer M.D.   On: 05/22/2022 16:02   DG Abd 1 View  Result Date: 05/22/2022 CLINICAL DATA:  Postoperative abdominal pain. EXAM: ABDOMEN - 1 VIEW COMPARISON:  None Available. FINDINGS: Gas is demonstrated within nondilated loops of large and small bowel in a nonobstructed pattern. Supine evaluation limited for the detection of free intraperitoneal air. Lumbar spine degenerative changes. Calcific density projecting over the pelvis, nonspecific, potentially calcified uterine fibroid. IMPRESSION: Nonobstructed bowel-gas pattern. Electronically Signed   By: Lovey Newcomer M.D.   On: 05/22/2022 14:08    Scheduled Meds:  sodium chloride   Intravenous Once   amLODipine  10 mg Oral Daily   calcium acetate  667 mg Oral TID WC   Chlorhexidine Gluconate Cloth  6 each Topical Daily   Chlorhexidine Gluconate Cloth  6 each Topical Q0600   darbepoetin (ARANESP) injection - DIALYSIS  150 mcg Subcutaneous Q Tue-1800   dextromethorphan-guaiFENesin  1 tablet Oral BID   feeding supplement  237 mL Oral BID BM   hydrALAZINE  50 mg Oral TID   [START ON 05/26/2022] pantoprazole  40 mg Intravenous Q12H   predniSONE  60 mg Oral Q breakfast   sodium bicarbonate   1,300 mg Oral BID   sucralfate  1 g Oral TID WC & HS   Continuous Infusions:  lactated ringers 10 mL/hr at 05/22/22 1703   pantoprazole 8 mg/hr (05/23/22 0325)   Critical Care Procedure Note Authorized and Performed by: Murvin Natal MD  Total Critical Care time:  42 mins  Due to a high probability of clinically significant, life threatening deterioration, the patient required my highest level of preparedness to intervene emergently and I personally spent this critical care time directly and personally managing the patient.  This critical care time included obtaining a history; examining the patient, pulse oximetry; ordering and review of studies; arranging urgent treatment with development of a management plan; evaluation of patient's response of treatment; frequent reassessment; and discussions with other providers.  This critical care time was performed to assess and manage the high probability of imminent and life threatening deterioration that could result in multi-organ failure.  It was exclusive of separately billable procedures and treating other patients and teaching time.     LOS: 7 days   Irwin Brakeman M.D on 05/23/2022 at 12:29 PM  Go to [email or web address] - for contact info  [facility] - Office  [phone]  If 7PM-7AM, please contact night-coverage [email or web address] 05/23/2022, 12:29 PM

## 2022-05-24 ENCOUNTER — Ambulatory Visit (HOSPITAL_COMMUNITY)
Admission: RE | Admit: 2022-05-24 | Discharge: 2022-05-24 | Disposition: A | Discharge status: Home / Self Care | Payer: Medicare Other | Source: Ambulatory Visit | Attending: Nephrology | Admitting: Nephrology

## 2022-05-24 DIAGNOSIS — N185 Chronic kidney disease, stage 5: Secondary | ICD-10-CM | POA: Insufficient documentation

## 2022-05-24 DIAGNOSIS — D62 Acute posthemorrhagic anemia: Secondary | ICD-10-CM | POA: Diagnosis not present

## 2022-05-24 DIAGNOSIS — K922 Gastrointestinal hemorrhage, unspecified: Secondary | ICD-10-CM | POA: Diagnosis not present

## 2022-05-24 DIAGNOSIS — D500 Iron deficiency anemia secondary to blood loss (chronic): Secondary | ICD-10-CM | POA: Diagnosis not present

## 2022-05-24 DIAGNOSIS — I120 Hypertensive chronic kidney disease with stage 5 chronic kidney disease or end stage renal disease: Secondary | ICD-10-CM | POA: Insufficient documentation

## 2022-05-24 DIAGNOSIS — K279 Peptic ulcer, site unspecified, unspecified as acute or chronic, without hemorrhage or perforation: Secondary | ICD-10-CM | POA: Diagnosis not present

## 2022-05-24 DIAGNOSIS — K269 Duodenal ulcer, unspecified as acute or chronic, without hemorrhage or perforation: Secondary | ICD-10-CM | POA: Insufficient documentation

## 2022-05-24 DIAGNOSIS — Z4901 Encounter for fitting and adjustment of extracorporeal dialysis catheter: Secondary | ICD-10-CM | POA: Diagnosis not present

## 2022-05-24 DIAGNOSIS — J189 Pneumonia, unspecified organism: Secondary | ICD-10-CM | POA: Diagnosis not present

## 2022-05-24 HISTORY — PX: IR FLUORO GUIDE CV LINE RIGHT: IMG2283

## 2022-05-24 HISTORY — PX: IR US GUIDE VASC ACCESS RIGHT: IMG2390

## 2022-05-24 LAB — TYPE AND SCREEN
ABO/RH(D): O POS
Antibody Screen: NEGATIVE
Unit division: 0
Unit division: 0
Unit division: 0
Unit division: 0
Unit division: 0

## 2022-05-24 LAB — BPAM RBC
Blood Product Expiration Date: 202401262359
Blood Product Expiration Date: 202402012359
Blood Product Expiration Date: 202402172359
Blood Product Expiration Date: 202402192359
Blood Product Expiration Date: 202402192359
ISSUE DATE / TIME: 202401181322
ISSUE DATE / TIME: 202401191042
ISSUE DATE / TIME: 202401191155
Unit Type and Rh: 5100
Unit Type and Rh: 5100
Unit Type and Rh: 5100
Unit Type and Rh: 5100
Unit Type and Rh: 5100

## 2022-05-24 LAB — CBC
HCT: 24.3 % — ABNORMAL LOW (ref 36.0–46.0)
Hemoglobin: 8.2 g/dL — ABNORMAL LOW (ref 12.0–15.0)
MCH: 29 pg (ref 26.0–34.0)
MCHC: 33.7 g/dL (ref 30.0–36.0)
MCV: 85.9 fL (ref 80.0–100.0)
Platelets: 140 K/uL — ABNORMAL LOW (ref 150–400)
RBC: 2.83 MIL/uL — ABNORMAL LOW (ref 3.87–5.11)
RDW: 17.5 % — ABNORMAL HIGH (ref 11.5–15.5)
WBC: 29.7 K/uL — ABNORMAL HIGH (ref 4.0–10.5)
nRBC: 0.1 % (ref 0.0–0.2)

## 2022-05-24 LAB — RENAL FUNCTION PANEL
Albumin: 2.1 g/dL — ABNORMAL LOW (ref 3.5–5.0)
Anion gap: 10 (ref 5–15)
BUN: 35 mg/dL — ABNORMAL HIGH (ref 8–23)
CO2: 24 mmol/L (ref 22–32)
Calcium: 7.1 mg/dL — ABNORMAL LOW (ref 8.9–10.3)
Chloride: 99 mmol/L (ref 98–111)
Creatinine, Ser: 2.54 mg/dL — ABNORMAL HIGH (ref 0.44–1.00)
GFR, Estimated: 20 mL/min — ABNORMAL LOW (ref >60)
Glucose, Bld: 78 mg/dL (ref 70–99)
Phosphorus: 2.8 mg/dL (ref 2.5–4.6)
Potassium: 2.9 mmol/L — ABNORMAL LOW (ref 3.5–5.1)
Sodium: 133 mmol/L — ABNORMAL LOW (ref 135–145)

## 2022-05-24 LAB — MAGNESIUM: Magnesium: 2 mg/dL (ref 1.7–2.4)

## 2022-05-24 MED ORDER — FENTANYL CITRATE (PF) 100 MCG/2ML IJ SOLN
INTRAMUSCULAR | Status: AC
  Filled 2022-05-24: qty 2

## 2022-05-24 MED ORDER — CEFAZOLIN SODIUM-DEXTROSE 2-4 GM/100ML-% IV SOLN
2.0000 g | Freq: Once | INTRAVENOUS | Status: AC
  Administered 2022-05-24: 2 g via INTRAVENOUS
  Filled 2022-05-24: qty 100

## 2022-05-24 MED ORDER — FENTANYL CITRATE (PF) 100 MCG/2ML IJ SOLN
INTRAMUSCULAR | Status: AC | PRN
  Administered 2022-05-24: 25 ug via INTRAVENOUS

## 2022-05-24 MED ORDER — MIDAZOLAM HCL 2 MG/2ML IJ SOLN
INTRAMUSCULAR | Status: AC
  Filled 2022-05-24: qty 2

## 2022-05-24 MED ORDER — CEFAZOLIN SODIUM-DEXTROSE 2-4 GM/100ML-% IV SOLN
INTRAVENOUS | Status: AC | PRN
  Administered 2022-05-24: 2 g via INTRAVENOUS

## 2022-05-24 MED ORDER — HEPARIN SODIUM (PORCINE) 1000 UNIT/ML IJ SOLN
INTRAMUSCULAR | Status: AC
  Administered 2022-05-24: 3.2 mL
  Filled 2022-05-24: qty 10

## 2022-05-24 MED ORDER — POTASSIUM CHLORIDE 20 MEQ PO PACK
40.0000 meq | PACK | Freq: Two times a day (BID) | ORAL | Status: DC
  Administered 2022-05-25 – 2022-05-26 (×4): 40 meq via ORAL
  Filled 2022-05-24 (×5): qty 2

## 2022-05-24 MED ORDER — LIDOCAINE-EPINEPHRINE 1 %-1:100000 IJ SOLN
INTRAMUSCULAR | Status: AC
  Administered 2022-05-24: 15 mL
  Filled 2022-05-24: qty 1

## 2022-05-24 MED ORDER — MIDAZOLAM HCL 2 MG/2ML IJ SOLN
INTRAMUSCULAR | Status: AC | PRN
  Administered 2022-05-24: 1 mg via INTRAVENOUS

## 2022-05-24 MED ORDER — CEFAZOLIN SODIUM-DEXTROSE 2-4 GM/100ML-% IV SOLN
INTRAVENOUS | Status: AC
  Filled 2022-05-24: qty 100

## 2022-05-24 MED ORDER — POTASSIUM CHLORIDE CRYS ER 20 MEQ PO TBCR
40.0000 meq | EXTENDED_RELEASE_TABLET | Freq: Two times a day (BID) | ORAL | Status: DC
  Administered 2022-05-24: 40 meq via ORAL
  Filled 2022-05-24 (×3): qty 2

## 2022-05-24 NOTE — Progress Notes (Signed)
Pt seen leaving the dept with [facility] enroute to [facility] IR for dialysis catheter placement. Report given to [place], RN in IR.

## 2022-05-24 NOTE — Progress Notes (Signed)
Upon patient transfer to 300, ICU RN handed this RN 3  bags of protonix. This RN placed 3 bags of Protonix in refrigerator bin 306. Bryson Corona Edd Fabian

## 2022-05-24 NOTE — TOC Progression Note (Signed)
Transition of Care [facility]) - Progression Note    Patient Details  Name: LAURETTE VILLESCAS MRN: [id number] Date of Birth: [birth year]  Transition of [facility]) CM/SW Contact  Boneta Lucks, RN Phone Number: 05/24/2022, 9:57 AM  Clinical Narrative:   Continuing workup, getting tunnel cath today. [facility] Chair time updated with Natalia in Admissions - TTS , starts 1/30 arrive at 10:30 am for paper work, needs to bring ID, Ins Card, SS card, normal treatment time will be 11:30am - Updated AVS.     Expected Discharge Plan: Home/Self Care Barriers to Discharge: Continued Medical Work up  Expected Discharge Plan and Services     Post Acute Care Choice: Dialysis Living arrangements for the past 2 months: Apartment                  Social Determinants of Health (SDOH) Interventions SDOH Screenings   Food Insecurity: No Food Insecurity (05/19/2022)  Housing: Low Risk  (05/19/2022)  Transportation Needs: No Transportation Needs (05/19/2022)  Utilities: Not At Risk (05/19/2022)  Tobacco Use: Medium Risk (05/19/2022)    Readmission Risk Interventions    05/18/2022   11:51 AM  Readmission Risk Prevention Plan  Transportation Screening Complete  PCP or Specialist Appt within 5-7 Days Not Complete  Home Care Screening Complete  Medication Review (RN CM) Complete

## 2022-05-24 NOTE — Progress Notes (Signed)
Gastroenterology Progress Note    Primary Care Physician:  [facility] Primary Gastroenterologist:  Dr. Jenetta Downer   Patient ID: Sheri Cooper; [id number]; 10-26-1950    Subjective   Returned from IR s/p dialysis catheter placement. She ate jello and notes some mild abdominal discomfort now. Feels like she needs to pass gas. No overt GI bleeding.    Objective   Vital signs in last 24 hours Temp:  [98 F (36.7 C)-98.3 F (36.8 C)] 98 F (36.7 C) (01/22 1138) Pulse Rate:  [70-90] 71 (01/22 1120) Resp:  [7-20] 13 (01/22 1120) BP: (104-187)/(31-55) 151/48 (01/22 1120) SpO2:  [92 %-100 %] 95 % (01/22 1120) Last BM Date : 05/24/22  Physical Exam General:   Alert and oriented, pleasant Head:  Normocephalic and atraumatic. Abdomen:  Bowel sounds present, distended but soft, mild TTP epigastric without rebound or guarding Neurologic:  Alert and  oriented x4   Intake/Output from previous day: 01/21 0701 - 01/22 0700 In: 1249.9 [P.O.:840; I.V.:409.9] Out: 2201 [Stool:1] Intake/Output this shift: No intake/output data recorded.  Lab Results  Recent Labs    05/22/22 0346 05/23/22 0414 05/24/22 0439  WBC 24.0* 20.1* 29.7*  HGB 9.8* 8.7* 8.2*  HCT 27.9* 25.4* 24.3*  PLT 134* 145* 140*   BMET Recent Labs    05/22/22 0346 05/22/22 1440 05/23/22 0414 05/24/22 0439  NA 135  --  135 133*  K 2.6* 3.0* 3.6 2.9*  CL 99  --  101 99  CO2 25  --  24 24  GLUCOSE 96  --  108* 78  BUN 47*  --  69* 35*  CREATININE 2.73*  --  3.54* 2.54*  CALCIUM 7.2*  --  7.4* 7.1*   LFT Recent Labs    05/22/22 0346 05/23/22 0414 05/24/22 0439  ALBUMIN 2.1* 2.1* 2.1*   Studies/Results DG CHEST PORT 1 VIEW  Result Date: 05/22/2022 CLINICAL DATA:  Evaluate for intra-abdominal free air. EXAM: PORTABLE CHEST 1 VIEW COMPARISON:  Abdominal radiograph 05/22/2022 FINDINGS: Central venous catheter tip projects over the superior vena cava. Stable cardiac and mediastinal  contours. Small layering right pleural effusion with underlying consolidation. No pneumothorax. No definite free air no definite gas identified underlying the hemidiaphragms. IMPRESSION: 1. Small layering right pleural effusion with underlying consolidation. 2. No definite free air identified underlying the hemidiaphragms. Consider further evaluation with decubitus imaging of the abdomen if there is persistent clinical concern given the limitations of this portable semi-erect chest radiograph. Electronically Signed   By: Lovey Newcomer M.D.   On: 05/22/2022 16:02   DG Abd 1 View  Result Date: 05/22/2022 CLINICAL DATA:  Postoperative abdominal pain. EXAM: ABDOMEN - 1 VIEW COMPARISON:  None Available. FINDINGS: Gas is demonstrated within nondilated loops of large and small bowel in a nonobstructed pattern. Supine evaluation limited for the detection of free intraperitoneal air. Lumbar spine degenerative changes. Calcific density projecting over the pelvis, nonspecific, potentially calcified uterine fibroid. IMPRESSION: Nonobstructed bowel-gas pattern. Electronically Signed   By: Lovey Newcomer M.D.   On: 05/22/2022 14:08   DG CHEST PORT 1 VIEW  Result Date: 05/21/2022 CLINICAL DATA:  Leukocytosis EXAM: PORTABLE CHEST 1 VIEW COMPARISON:  05/18/2022 FINDINGS: Central venous line tip in distal SVC. Normal cardiac silhouette. Small RIGHT effusion. Mild venous congestion. No pneumothorax. No focal consolidation. Lymphadenectomy clips in the LEFT axilla. No significant interval change. IMPRESSION: 1. No significant interval change. 2. Small RIGHT effusion. 3. Mild venous congestion. Electronically Signed   By: Nicole Kindred  Leonia Reeves M.D.   On: 05/21/2022 08:22   US BIOPSY (KIDNEY)  Result Date: 05/19/2022 INDICATION: 72 year old female with history of end-stage renal disease. EXAM: ULTRASOUND GUIDED RENAL BIOPSY COMPARISON:  None Available. MEDICATIONS: None. ANESTHESIA/SEDATION: Fentanyl 25 mcg IV; Versed 0.5 mg IV Total  Moderate Sedation time: 10 minutes; The patient was continuously monitored during the procedure by the interventional radiology nurse under my direct supervision. COMPLICATIONS: None immediate. PROCEDURE: Informed written consent was obtained from the patient after a discussion of the risks, benefits and alternatives to treatment. The patient understands and consents the procedure. A timeout was performed prior to the initiation of the procedure. Ultrasound scanning was performed of the bilateral flanks. The inferior pole of the left kidney was selected for biopsy due to location and sonographic window. The procedure was planned. The operative site was prepped and draped in the usual sterile fashion. The overlying soft tissues were anesthetized with 1% lidocaine with epinephrine. A 17 gauge coaxial introducer needle was advanced into the inferior cortex of the left kidney and 2 core biopsies were obtained under direct ultrasound guidance with a 16 gauge core biopsy device. Images were saved for documentation purposes. The biopsy device was removed and hemostasis was obtained with manual compression after injection of Gel-Foam slurry along the needle track under ultrasound guidance. Post procedural scanning was negative for significant post procedural hemorrhage or additional complication. A dressing was placed. The patient tolerated the procedure well without immediate post procedural complication. IMPRESSION: Technically successful ultrasound guided left renal biopsy. Ruthann Cancer, MD [facility] Radiology Electronically Signed   By: Ruthann Cancer M.D.   On: 05/19/2022 14:19   DG Chest Port 1 View  Result Date: 05/18/2022 CLINICAL DATA:  Central line placement. EXAM: PORTABLE CHEST 1 VIEW COMPARISON:  CT chest and chest x-ray from 2 days ago. FINDINGS: New right internal jugular central venous catheter with tip at the cavoatrial junction. The heart size and  mediastinal contours are within normal limits. Mild diffuse interstitial thickening similar. Decreasing small right and unchanged small left pleural effusions. Increasing asymmetric density in the peripheral left upper lobe. Similar atelectasis in the peripheral right middle lobe. Improving right lower lobe atelectasis. No pneumothorax. No acute osseous abnormality. IMPRESSION: 1. New right internal jugular central venous catheter with tip at the cavoatrial junction. No pneumothorax. 2. Similar diffuse interstitial thickening with increasing asymmetric density in the peripheral left upper lobe. Differential considerations include infection or pulmonary edema. 3. Decreased small right and unchanged small left pleural effusions. Electronically Signed   By: Titus Dubin M.D.   On: 05/18/2022 14:06   US RENAL  Result Date: 05/18/2022 CLINICAL DATA:  Chronic kidney disease EXAM: RENAL / URINARY TRACT ULTRASOUND COMPLETE COMPARISON:  Abdomen CT report 01/13/2018 FINDINGS: Right Kidney: Renal measurements: 8.9 x 4.7 x 4.4 = volume: 97 mL. Kidney appears echogenic. No collecting system dilatation. There is a simple cyst along the midportion of the kidney which is anechoic with through transmission measuring 17 mm. Left Kidney: Renal measurements: 7.5 x 3.9 x 3.2 = volume: 49 mL. Small echogenic appearing kidney. No collecting system dilatation. Anechoic rounded structure seen in the mid kidney measuring 2 cm. Through transmission is smoothly marginated. Bladder: Appears normal for degree of bladder distention. Other: Scattered ascites.  Probable right-sided pleural effusion. IMPRESSION: Echogenic small appearing kidneys. No collecting system dilatation. Bilateral simple appearing renal cysts. Ascites.  Right pleural effusion Electronically Signed   By: Jill Side M.D.   On:  05/18/2022 11:07   CT Chest Wo Contrast  Result Date: 05/16/2022 CLINICAL DATA:  72 year old female with history of suspected pneumonia.  EXAM: CT CHEST WITHOUT CONTRAST TECHNIQUE: Multidetector CT imaging of the chest was performed following the standard protocol without IV contrast. RADIATION DOSE REDUCTION: This exam was performed according to the departmental dose-optimization program which includes automated exposure control, adjustment of the mA and/or kV according to patient size and/or use of iterative reconstruction technique. COMPARISON:  Chest x-ray 05/16/2022. FINDINGS: Cardiovascular: Heart size is normal. There is no significant pericardial fluid, thickening or pericardial calcification. Aortic atherosclerosis. No definite coronary artery calcifications. Mild calcifications of the aortic valve. Mediastinum/Nodes: No pathologically enlarged mediastinal or hilar lymph nodes. Please note that accurate exclusion of hilar adenopathy is limited on noncontrast CT scans. Esophagus is unremarkable in appearance. No axillary lymphadenopathy. Lungs/Pleura: Moderate to large right and small to moderate left pleural effusions lying dependently associated with areas of passive subsegmental atelectasis in the lower lobes of the lungs bilaterally. In addition, there are mid to upper lung predominant patchy areas of ground-glass attenuation and nodular appearing consolidative airspace disease, likely to reflect multilobar bilateral bronchopneumonia. The largest of these nodular appearing regions in the apex of the left upper lobe (axial image 32 of series 4) measuring 1.3 x 1.2 cm. Upper Abdomen: Aortic atherosclerosis. Tiny partially calcified gallstones lying dependently in the fundus of the gallbladder. Small volume of ascites. Multiple renal lesions bilaterally, incompletely characterized on today's noncontrast CT examination, most notable for an exophytic intermediate attenuation lesion (25 HU) measuring 1.9 cm in the anterior aspect of the interpolar region of the left kidney. A cluster of nonobstructive calculi is also noted in the posterior  aspect of the upper pole collecting system of the right kidney measuring up to 3 mm. Musculoskeletal: There are no aggressive appearing lytic or blastic lesions noted in the visualized portions of the skeleton. IMPRESSION: 1. The appearance the chest is most suggestive of multilobar bilateral bronchopneumonia with bilateral parapneumonic pleural effusions, as above. 2. Given the nodular appearance of some of the areas of apparent airspace consolidation, follow-up noncontrast chest CT is recommended in 3 months to ensure resolution of the nodular areas following treatment for the patient's acute illness, to exclude underlying neoplasm. 3. Multiple renal lesions bilaterally, incompletely characterized on today's noncontrast CT examination, including an indeterminate 1.9 cm lesion in the anterior aspect of the interpolar region of the left kidney. Follow-up nonemergent outpatient abdominal MRI with and without IV gadolinium is recommended in the near future to definitively evaluate this lesion and exclude neoplasm. 4. Cholelithiasis. 5. Nonobstructive calculi in the right renal collecting system measuring up to 3 mm. 6. Ascites. 7. Aortic atherosclerosis. Aortic Atherosclerosis (ICD10-I70.0). Electronically Signed   By: Vinnie Langton M.D.   On: 05/16/2022 05:23   DG Chest Port 1 View  Result Date: 05/16/2022 CLINICAL DATA:  Shortness of breath EXAM: PORTABLE CHEST 1 VIEW COMPARISON:  05/07/2022 FINDINGS: Cardiac shadow is enlarged but stable. Small pleural effusion on the right is again noted. Some increased basilar density is noted on the right when compared with the prior study. Postsurgical changes in the left breast are seen. No bony abnormality is noted. IMPRESSION: Persistent right-sided effusion with slight increase in basilar infiltrate on the right. Electronically Signed   By: Inez Catalina M.D.   On: 05/16/2022 03:45    Assessment   Sheri Cooper is a 72 y.o. year old female with past medical  history of COPD, RA, CKD  stage 4, admitted to the hospital after presenting pneumonia and AKI on CKD. Gastroenterology was consulted for evaluation of rectal bleeding and worsening anemia.   Colonoscopy undertaken with melena in TI and colon, followed by EGD with Grade C esophagitis, gastritis, small non-bleeding ulcers in fundus, and multiple duodenal ulcers with 2 of the ulcers having large adherent clots s/p removal. 2 vessels underlying the clots with one actively bleeding s/p bleeding control therapy. Mild discomfort after jello this morning. Abdominal pain unchanged from yesterday.   Anemia: Hgb down to 6.3 this admission, s/p total of 3 units PRBCs.  Hgb 8.2 this morning, decreased from 8.7 yesterday. One loose black stool yesterday evening, which is not unexpected in light of EGD findings. Suspect may see remnants of old blood. Monitor for significant bleeding or notable drops in H/H.     Plan / Recommendations  Pantoprazole infusion for total of 72 hours, then BID at discharge Carafate every 6 hours Avoidance of NSAIDs Will need repeat EGD in 2 months If recurrent bleeding, hemodynamically unstable, needs stat CTA with consult to IR if active bleeding noted.     LOS: 8 days    05/24/2022, 12:40 PM  Annitta Needs, PhD, [facility] Gastroenterology

## 2022-05-24 NOTE — Progress Notes (Signed)
[facility] Chair time updated with Sheri Cooper in Admissions - TTS , starts 1/30 arrive at 10:30 am for paper work, needs to bring ID, Ins Card, SS card, normal treatment time will be 11:30am

## 2022-05-24 NOTE — Progress Notes (Signed)
Patient ID: Sheri Cooper, female   DOB: 12/23/1950, 72 y.o.   MRN: [id number] HPI:  Pt with PMH significant for HTN, COPD, RA on humira who presented to [facility] on 05/16/22 with SOB and PNA.  She was also having rapidly progressive AKI from April to November 2023 with Scrclimbing from 1.8 to 3.7 and found to have Scr of 6.95 on 05/10/22 at outside hospital.  In the [facility] ED, Scr was 7.5 and continued to climb to 8.12 on 05/18/22 which prompted our consultation.   UA with blood and 4+ proteinuria.  She underwent her first HD session on 05/18/22 via temp RIJ HD catheter and started on IV solumedrol x 3.  She underwent US-guided kidney biopsy on 05/19/22.  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 Plan: stay the course with prednisone 60 mg daily for now, will need prolonged taper course. EM pending.  O:BP (!) 148/31   Pulse 75   Temp 98.3 F (36.8 C) (Oral)   Resp 16   Ht '4\' 11"'$  (1.499 m)   Wt 51.3 kg   SpO2 92%   BMI 22.84 kg/m    Intake/Output: I/O last 3 completed shifts: In: 1725.4 [P.O.:1080; I.V.:645.4] Out: 2201 [Other:2200; Stool:1]  Intake/Output this shift:  No intake/output data recorded. Weight change:  Pt was at [facility] IR for [facility] placement so exam could not be performed.  Recent Labs  Lab 05/18/22 0300 05/19/22 0433 05/20/22 0501 05/21/22 0456 05/22/22 0346 05/22/22 1440 05/23/22 0414 05/24/22 0439  NA 142 139 138 135 135  --  135 133*  K 3.9 3.4* 3.2* 3.0* 2.6* 3.0* 3.6 2.9*  CL 112* 105 106 101 99  --  101 99  CO2 8* 17* 15* 22 25  --  24 24  GLUCOSE 57* 100* 141* 120* 96  --  108* 78  BUN 118* 85* 113* 78* 47*  --  69* 35*  CREATININE 8.12* 5.57* 5.92* 4.03* 2.73*  --  3.54* 2.54*  ALBUMIN 2.4* 2.2* 2.1* 1.9* 2.1*  --  2.1* 2.1*  CALCIUM 7.2* 7.0* 6.8* 6.8* 7.2*  --  7.4* 7.1*  PHOS 12.8* 8.4* 8.8* 5.2* 3.6  --  4.5 2.8   Liver Function Tests: Recent Labs  Lab 05/22/22 0346 05/23/22 0414 05/24/22 0439  ALBUMIN 2.1* 2.1* 2.1*   No results  for input(s): "LIPASE", "AMYLASE" in the last 168 hours. No results for input(s): "AMMONIA" in the last 168 hours. CBC: Recent Labs  Lab 05/20/22 0501 05/20/22 2000 05/21/22 0456 05/21/22 1601 05/22/22 0346 05/23/22 0414 05/24/22 0439  WBC 13.1*  --  15.4*  --  24.0* 20.1* 29.7*  HGB 6.3*   < > 6.1*   < > 9.8* 8.7* 8.2*  HCT 18.7*   < > 17.5*   < > 27.9* 25.4* 24.3*  MCV 77.6*  --  78.8*  --  81.6 82.7 85.9  PLT 129*  --  109*  --  134* 145* 140*   < > = values in this interval not displayed.   Cardiac Enzymes: No results for input(s): "CKTOTAL", "CKMB", "CKMBINDEX", "TROPONINI" in the last 168 hours. CBG: Recent Labs  Lab 05/18/22 0604 05/18/22 0621 05/18/22 0649  GLUCAP 64* 66* 83    Iron Studies: No results for input(s): "IRON", "TIBC", "TRANSFERRIN", "FERRITIN" in the last 72 hours. Studies/Results: DG CHEST PORT 1 VIEW  Result Date: 05/22/2022 CLINICAL DATA:  Evaluate for intra-abdominal free air. EXAM: PORTABLE CHEST 1 VIEW COMPARISON:  Abdominal radiograph 05/22/2022 FINDINGS:  Central venous catheter tip projects over the superior vena cava. Stable cardiac and mediastinal contours. Small layering right pleural effusion with underlying consolidation. No pneumothorax. No definite free air no definite gas identified underlying the hemidiaphragms. IMPRESSION: 1. Small layering right pleural effusion with underlying consolidation. 2. No definite free air identified underlying the hemidiaphragms. Consider further evaluation with decubitus imaging of the abdomen if there is persistent clinical concern given the limitations of this portable semi-erect chest radiograph. Electronically Signed   By: Lovey Newcomer M.D.   On: 05/22/2022 16:02   DG Abd 1 View  Result Date: 05/22/2022 CLINICAL DATA:  Postoperative abdominal pain. EXAM: ABDOMEN - 1 VIEW COMPARISON:  None Available. FINDINGS: Gas is demonstrated within nondilated loops of large and small bowel in a nonobstructed pattern.  Supine evaluation limited for the detection of free intraperitoneal air. Lumbar spine degenerative changes. Calcific density projecting over the pelvis, nonspecific, potentially calcified uterine fibroid. IMPRESSION: Nonobstructed bowel-gas pattern. Electronically Signed   By: Lovey Newcomer M.D.   On: 05/22/2022 14:08    sodium chloride   Intravenous Once   amLODipine  10 mg Oral Daily   calcium acetate  667 mg Oral TID WC   Chlorhexidine Gluconate Cloth  6 each Topical Daily   Chlorhexidine Gluconate Cloth  6 each Topical Q0600   darbepoetin (ARANESP) injection - DIALYSIS  150 mcg Subcutaneous Q Tue-1800   dextromethorphan-guaiFENesin  1 tablet Oral BID   feeding supplement  237 mL Oral BID BM   fentaNYL       hydrALAZINE  50 mg Oral TID   midazolam       [START ON 05/26/2022] pantoprazole  40 mg Intravenous Q12H   potassium chloride  40 mEq Oral BID   predniSONE  60 mg Oral Q breakfast   sodium bicarbonate  1,300 mg Oral BID   sucralfate  1 g Oral TID WC & HS    BMET    Component Value Date/Time   NA 133 (L) 05/24/2022 0439   K 2.9 (L) 05/24/2022 0439   CL 99 05/24/2022 0439   CO2 24 05/24/2022 0439   GLUCOSE 78 05/24/2022 0439   BUN 35 (H) 05/24/2022 0439   CREATININE 2.54 (H) 05/24/2022 0439   CALCIUM 7.1 (L) 05/24/2022 0439   GFRNONAA 20 (L) 05/24/2022 0439   CBC    Component Value Date/Time   WBC 29.7 (H) 05/24/2022 0439   RBC 2.83 (L) 05/24/2022 0439   HGB 8.2 (L) 05/24/2022 0439   HCT 24.3 (L) 05/24/2022 0439   PLT 140 (L) 05/24/2022 0439   MCV 85.9 05/24/2022 0439   MCH 29.0 05/24/2022 0439   MCHC 33.7 05/24/2022 0439   RDW 17.5 (H) 05/24/2022 0439   LYMPHSABS 1.4 05/16/2022 0309   MONOABS 0.6 05/16/2022 0309   EOSABS 0.0 05/16/2022 0309   BASOSABS 0.1 05/16/2022 0309     Assessment/Plan:  AKI/CKD stage IIIb - rapidly progressive.  ANCA, ANA, anti-GBM neg, C3/C4 WNL. HIV, Hep B neg. FLC within acceptable limits. SPEP pending.  She underwent her first HD  session on 05/18/22 via temp RIJ HD catheter and started on IV solumedrol x 3.  She underwent US-guided kidney biopsy on 05/19/22.  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 Plan: stay the course with prednisone 60 mg daily for now, will need prolonged taper course. EM pending.   For [facility] placement by IR today at [facility].  Will continue with IHD on TTS schedule for now.  She has  a chair at [facility] TTS pm shift.  Will need to be there at 10:30 am on 06/01/22.  Avoid nephrotoxic medications including NSAIDs and iodinated intravenous contrast exposure unless the latter is absolutely indicated.   Preferred narcotic agents for pain control are hydromorphone, fentanyl, and methadone. Morphine should not be used.  Avoid Baclofen and avoid oral sodium phosphate and magnesium citrate based laxatives / bowel preps.  Continue strict Input and Output monitoring. Will monitor the patient closely with you and intervene or adjust therapy as indicated by changes in clinical status/labs   HCAP and COPD exacerbation - abx per primary. Anemia of CKD stage V - started on ESA and transfuse prn.  FOBT +, GI consulted.  EGD with grade C esophagitis and gastritis with small nonbleeding ulcers in fundus.  2 ulcers in the duodenum with large adherent clots with underlying bleeding vessels treated with epi and coagulation.  One vessel treated also with a clip, however second vessel could not be clipped.  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  No heparin. Hypokalemia - replete orally and follow.  Will use 4K bath with HD tomorrow.  Disposition - has outpatient spot on 06/01/22 at [facility].   Donetta Potts, MD [facility]

## 2022-05-24 NOTE — Procedures (Signed)
Interventional Radiology Procedure Note  Procedure: Placement of a 19 cm Palindrome TDC. Cath tip in the RA and ready for use.  Complications: None  Estimated Blood Loss: None  Recommendations: - Routine line care   Signed,  Criselda Peaches, MD

## 2022-05-24 NOTE — Plan of Care (Signed)

## 2022-05-24 NOTE — Progress Notes (Signed)
PROGRESS NOTE   Sheri Cooper, is a 72 y.o. female, DOB - 04-27-1951, OAC:[id number]  Admit date - 05/16/2022   Admitting Physician Bernadette Hoit, DO  Outpatient Primary MD for the patient is [facility]  LOS - 8  Chief Complaint  Patient presents with   Shortness of Breath      Brief Summary:-  72 y.o. female with medical history significant of COPD, rheumatoid arthritis, stage V CKD  Admitted on 05/16/2022 with healthcare associated pneumonia and worsening renal function with anion gap metabolic acidosis -Patient was recently discharged from [facility] on 05/12/2022 after treatment for COPD.  Pt was started on hemodialysis on 05/18/22 after temporary central line placement and had renal biopsy done on 05/19/22.     A/p HCAP-- TREATED  - recent hospitalization at [facility] as outlined above -COVID, flu and RSV negative -Leukocytosis and elevated procalcitonin noted WBC 15.3 >> 19.8 >>20.2 -completed last dose cefepime 05/20/21 -Bronchodilators and mucolytics as ordered    AKI on CKD 5--very poor urine output, significant metabolic acidosis noted, becoming more sleepy - now transitioned to hemodialysis  -Anion gap metabolic acidosis noted and sodium bicarbonate -patient sees Dr. Theador Hawthorne as outpatient---discussed with Dr. Lars Masson was concerned that patient would need hemodialysis this admission which has now been started -Care Everywhere [facility] reviewed -Right IJ HD catheter placed by Dr. 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   -per renal team, continue steroids for now   COPD - stable  -continue bronchodilators -Pt is on high dose steroids per  nephrology for AKI  Leukocytosis  - I think this is a leukemoid reaction from high dose steroids that patient is on - we have not found any evidence of infection at this time  - we are following CBC  Hypokalemia -repleted orally  -recheck in AM  Acute blood loss Anemia / Anemia in CKD -Ferritin and serum iron are not low -Hg down to 6.3, transfused 1 unit PRBC with HD on 1/18, had large bloody BM, Hg down to 6.1 1/19, transfuse 2 units PRBC with HD on 1/19  -recheck Hg improved to 9.8, now trended down to 8.7 after EGD -follow CBC daily; if recurrent GI bleed or signs of hemodynamic instability GI team recommends getting a CT angio bleed protocol STAT and to consult IR if active bleeding identified  Guaiac Positive Stools / GI bleeding Bleeding duodenal ulcers s/p clip x1, epinephrine and coagulation treatments Esophagitis and Gastritis  -large bloody BM noted by RN guaiac positive on 1/18 -DC'd subcutaneous heparin on 1/18 -transfused 1 unit PRBC with HD on 1/18, Hg down again to 6.1, transfused 2 unit PRBC 1/19 with HD  -GI team consulted to eval 1/19 -transfused 2 units PRBC with HD 1/19, Hg up to 9.8 -EGD / Colonoscopy planned for 05/22/22 with Dr. Jenetta Downer - with findings of grade C esophagitis and gastritis with ulcers, multiple duodenal ulcers with blood clots, bleeding vessel clip x 1, epinephrine injection given and coagulation.   Thrombocytopenia - resolved now - we stopped [place] heparin on 1/18 - checking HIT antibody labs - pending results - platelets improved to 145 after stopping all heparins  - "pharmacy on HIT alert" order placed    Incidental finding  of multiple renal lesions CT angiography of chest showed multiple renal lesions bilaterally, incompletely characterized on today's noncontrast CT examination, including an indeterminate 1.9 cm lesion in the anterior aspect of the interpolar region of the left kidney.  Follow-up nonemergent outpatient abdominal MRI with  and without IV gadolinium is recommended in the near future to definitively evaluate this lesion and exclude neoplasm   Elevated BNP--echo from 05/10/2022 at [facility] available in [place] reveals-  The left ventricle is normal in size with normal wall thickness. The left ventricular systolic function is normal, LVEF is visually estimated at 60-65%.  There is normal left ventricular diastolic function.  -There is mild to moderate pulmonary hypertension no significant valvular abnormalities -hemodialysis to manage volume status   Diarrhea - resolved now;  - c diff testing was negative   Procedures:- -Right IJ HD catheter placed on 05/18/22 -Initiated on hemodialysis on 05/18/2022 -completed IR renal biopsy on 05/19/2022 at [facility] -EGD/colonoscopy completed 05/22/2022   Status is: Inpatient   Disposition: The patient is from: Home              Anticipated d/c is to: Home --clipped to an outpatient HD center TTS starting 06/01/22 [facility]              Anticipated d/c date is: > 3 days              Patient currently is not medically stable to d/c. Barriers: Not Clinically Stable-  Code Status :  -  Code Status: Full Code   Family Communication:    NA (patient is alert, awake and coherent)   DVT Prophylaxis  :   - SCDs   SCDs Start: 05/16/22 0644   Lab Results  Component Value Date   PLT 140 (L) 05/24/2022   Inpatient Medications  Scheduled Meds:  sodium chloride   Intravenous Once   amLODipine  10 mg Oral Daily   calcium acetate  667 mg Oral TID WC   Chlorhexidine Gluconate Cloth  6 each Topical Daily   Chlorhexidine Gluconate Cloth  6 each Topical Q0600   darbepoetin (ARANESP) injection - DIALYSIS  150 mcg Subcutaneous Q Tue-1800   dextromethorphan-guaiFENesin  1 tablet Oral BID   feeding supplement  237 mL Oral BID BM   fentaNYL       hydrALAZINE  50 mg Oral TID   midazolam       [START ON 05/26/2022] pantoprazole  40 mg Intravenous Q12H   potassium chloride   40 mEq Oral BID   predniSONE  60 mg Oral Q breakfast   sodium bicarbonate  1,300 mg Oral BID   sucralfate  1 g Oral TID WC & HS   Continuous Infusions:  albumin human     ceFAZolin     lactated ringers 10 mL/hr at 05/24/22 1156   pantoprazole 8 mg/hr (05/24/22 1309)   PRN Meds:.acetaminophen **OR** acetaminophen, albumin human, alteplase, ceFAZolin, fentaNYL, fentaNYL (SUBLIMAZE) injection, heparin, ipratropium-albuterol, labetalol, loperamide, midazolam, oxyCODONE, prochlorperazine   Anti-infectives (From admission, onward)    Start     Dose/Rate Route Frequency Ordered Stop   05/24/22 1230  ceFAZolin (ANCEF) IVPB 2g/100 mL premix        2 g 200 mL/hr over 30 Minutes Intravenous  Once 05/24/22 0916 05/24/22 1227   05/24/22 0920  ceFAZolin (ANCEF) 2-4 GM/100ML-% IVPB       Note to Pharmacy: Bonnielee Haff C: cabinet override      05/24/22 0920 05/24/22 2129  05/17/22 0800  ceFEPIme (MAXIPIME) 1 g in sodium chloride 0.9 % 100 mL IVPB  Status:  Discontinued        1 g 200 mL/hr over 30 Minutes Intravenous Every 24 hours 05/16/22 0641 05/20/22 1012   05/16/22 0641  vancomycin variable dose per unstable renal function (pharmacist dosing)  Status:  Discontinued         Does not apply See admin instructions 05/16/22 0641 05/17/22 1605   05/16/22 0445  vancomycin (VANCOREADY) IVPB 1250 mg/250 mL        1,250 mg 166.7 mL/hr over 90 Minutes Intravenous  Once 05/16/22 0444 05/16/22 0732   05/16/22 0430  ceFEPIme (MAXIPIME) 2 g in sodium chloride 0.9 % 100 mL IVPB        2 g 200 mL/hr over 30 Minutes Intravenous  Once 05/16/22 0426 05/16/22 0534       Subjective: Sheri Cooper seen before [facility] placement today.  Tolerating diet so far.        Objective: Vitals:   05/24/22 1138 05/24/22 1300 05/24/22 1400 05/24/22 1500  BP:  (!) 147/39 (!) 124/32 (!) 124/35  Pulse:  70 69 73  Resp:  (!) '21 12 18  '$ Temp: 98 F (36.7 C)     TempSrc: Oral     SpO2:  95% 95% 94%  Weight:       Height:        Intake/Output Summary (Last 24 hours) at 05/24/2022 1538 Last data filed at 05/24/2022 1215 Gross per 24 hour  Intake 1232.38 ml  Output 2201 ml  Net -968.62 ml   Filed Weights   05/21/22 1335 05/22/22 0500 05/23/22 1210  Weight: 52 kg 50.8 kg 51.3 kg    Physical Exam  Gen: awake, alert, cooperative, NAD; appears less pale today  HEENT: NCAT; pERRL, sclera white.  Neck-Supple Neck, right IJ hemodialysis catheter in place; no sign of infection seen Lungs-BBS shallow but clear to auscultation   CV- normal s1, s2 sounds;   Abd-  normal BS, ND/NT, no HSM   Extremity/Skin:-  1+ edema,  normal pedal pulses present  Psych-flat affect unchanged Neuro-generalized weakness no new focal deficits, no tremors  Data Reviewed: I have personally reviewed following labs and imaging studies  CBC: Recent Labs  Lab 05/20/22 0501 05/20/22 2000 05/21/22 0456 05/21/22 1601 05/22/22 0346 05/23/22 0414 05/24/22 0439  WBC 13.1*  --  15.4*  --  24.0* 20.1* 29.7*  HGB 6.3*   < > 6.1* 10.5* 9.8* 8.7* 8.2*  HCT 18.7*   < > 17.5* 29.8* 27.9* 25.4* 24.3*  MCV 77.6*  --  78.8*  --  81.6 82.7 85.9  PLT 129*  --  109*  --  134* 145* 140*   < > = values in this interval not displayed.   Basic Metabolic Panel: Recent Labs  Lab 05/20/22 0501 05/21/22 0456 05/22/22 0346 05/22/22 1039 05/22/22 1440 05/23/22 0414 05/24/22 0439  NA 138 135 135  --   --  135 133*  K 3.2* 3.0* 2.6*  --  3.0* 3.6 2.9*  CL 106 101 99  --   --  101 99  CO2 15* 22 25  --   --  24 24  GLUCOSE 141* 120* 96  --   --  108* 78  BUN 113* 78* 47*  --   --  69* 35*  CREATININE 5.92* 4.03* 2.73*  --   --  3.54* 2.54*  CALCIUM 6.8* 6.8* 7.2*  --   --  7.4* 7.1*  MG 2.2  --   --  1.7  --  2.2 2.0  PHOS 8.8* 5.2* 3.6  --   --  4.5 2.8   GFR: Estimated Creatinine Clearance: 13.7 mL/min (A) (by C-G formula based on SCr of 2.54 mg/dL (H)). Liver Function Tests: Recent Labs  Lab 05/20/22 0501 05/21/22 0456  05/22/22 0346 05/23/22 0414 05/24/22 0439  ALBUMIN 2.1* 1.9* 2.1* 2.1* 2.1*   Recent Results (from the past 240 hour(s))  Resp panel by RT-PCR (RSV, Flu A&B, Covid) Anterior Nasal Swab     Status: None   Collection Time: 05/16/22  3:33 AM   Specimen: Anterior Nasal Swab  Result Value Ref Range Status   SARS Coronavirus 2 by RT PCR NEGATIVE NEGATIVE Final    Comment: (NOTE) SARS-CoV-2 target nucleic acids are NOT DETECTED.  The SARS-CoV-2 RNA is generally detectable in upper respiratory specimens during the acute phase of infection. The lowest concentration of SARS-CoV-2 viral copies this assay can detect is 138 copies/mL. A negative result does not preclude SARS-Cov-2 infection and should not be used as the sole basis for treatment or other patient management decisions. A negative result may occur with  improper specimen collection/handling, submission of specimen other than nasopharyngeal swab, presence of viral mutation(s) within the areas targeted by this assay, and inadequate number of viral copies(<138 copies/mL). A negative result must be combined with clinical observations, patient history, and epidemiological information. The expected result is Negative.  Fact Sheet for Patients:  EntrepreneurPulse.com.au  Fact Sheet for Healthcare Providers:  IncredibleEmployment.be  This test is no t yet approved or cleared by the [place] FDA and  has been authorized for detection and/or diagnosis of SARS-CoV-2 by FDA under an Emergency Use Authorization (EUA). This EUA will remain  in effect (meaning this test can be used) for the duration of the COVID-19 declaration under Section 564(b)(1) of the Act, 21 U.S.C.section 360bbb-3(b)(1), unless the authorization is terminated  or revoked sooner.       Influenza A by PCR NEGATIVE NEGATIVE Final   Influenza B by PCR NEGATIVE NEGATIVE Final    Comment: (NOTE) The Xpert Xpress  SARS-CoV-2/FLU/RSV plus assay is intended as an aid in the diagnosis of influenza from Nasopharyngeal swab specimens and should not be used as a sole basis for treatment. Nasal washings and aspirates are unacceptable for Xpert Xpress SARS-CoV-2/FLU/RSV testing.  Fact Sheet for Patients: EntrepreneurPulse.com.au  Fact Sheet for Healthcare Providers: IncredibleEmployment.be  This test is not yet approved or cleared by the [place] FDA and has been authorized for detection and/or diagnosis of SARS-CoV-2 by FDA under an Emergency Use Authorization (EUA). This EUA will remain in effect (meaning this test can be used) for the duration of the COVID-19 declaration under Section 564(b)(1) of the Act, 21 U.S.C. section 360bbb-3(b)(1), unless the authorization is terminated or revoked.     Resp Syncytial Virus by PCR NEGATIVE NEGATIVE Final    Comment: (NOTE) Fact Sheet for Patients: EntrepreneurPulse.com.au  Fact Sheet for Healthcare Providers: IncredibleEmployment.be  This test is not yet approved or cleared by the [place] FDA and has been authorized for detection and/or diagnosis of SARS-CoV-2 by FDA under an Emergency Use Authorization (EUA). This EUA will remain in effect (meaning this test can be used) for the duration of the COVID-19 declaration under Section 564(b)(1) of the Act, 21 U.S.C. section 360bbb-3(b)(1), unless the authorization is terminated or revoked.  Performed at [facility], [address], [place], [place] [address]  Culture, blood (routine x 2) Call MD if unable to obtain prior to antibiotics being given     Status: None   Collection Time: 05/16/22  6:44 AM   Specimen: BLOOD RIGHT FOREARM  Result Value Ref Range Status   Specimen Description   Final    BLOOD RIGHT FOREARM BOTTLES DRAWN AEROBIC AND ANAEROBIC   Special Requests Blood Culture adequate volume  Final   Culture    Final    NO GROWTH 5 DAYS Performed at [facility], [address] [place], [place] [address]    Report Status 05/21/2022 FINAL  Final  Culture, blood (routine x 2) Call MD if unable to obtain prior to antibiotics being given     Status: None   Collection Time: 05/16/22  7:11 AM   Specimen: BLOOD LEFT ARM  Result Value Ref Range Status   Specimen Description BLOOD LEFT ARM BOTTLES DRAWN AEROBIC AND ANAEROBIC  Final   Special Requests Blood Culture adequate volume  Final   Culture   Final    NO GROWTH 5 DAYS Performed at [facility], [address] [place], [place] [address]    Report Status 05/21/2022 FINAL  Final  MRSA Next Gen by PCR, Nasal     Status: None   Collection Time: 05/16/22 12:10 PM   Specimen: Nasal Mucosa; Nasal Swab  Result Value Ref Range Status   MRSA by PCR Next Gen NOT DETECTED NOT DETECTED Final    Comment: (NOTE) The GeneXpert MRSA Assay (FDA approved for NASAL specimens only), is one component of a comprehensive MRSA colonization surveillance program. It is not intended to diagnose MRSA infection nor to guide or monitor treatment for MRSA infections. Test performance is not FDA approved in patients less than 41 years old. Performed at [facility], [address], [place], [place] [address]   C Difficile Quick Screen w PCR reflex     Status: None   Collection Time: 05/16/22 12:10 PM   Specimen: STOOL  Result Value Ref Range Status   C Diff antigen NEGATIVE NEGATIVE Final   C Diff toxin NEGATIVE NEGATIVE Final   C Diff interpretation No C. difficile detected.  Final    Comment: Performed at [facility], [address] [place], [place] [address]  Stool culture     Status: None   Collection Time: 05/16/22 12:10 PM   Specimen: Stool  Result Value Ref Range Status   Salmonella/Shigella Screen Final report  Final   Campylobacter Culture Final report  Final   E coli, Shiga toxin Assay Negative Negative Final    Comment: (NOTE) Performed At: [facility] [place], [place] [id number] Rush Farmer MD [id number]   Gastrointestinal Panel by PCR , Stool     Status: None   Collection Time: 05/16/22 12:10 PM   Specimen: Stool  Result Value Ref Range Status   Campylobacter species NOT DETECTED NOT DETECTED Final   Plesimonas shigelloides NOT DETECTED NOT DETECTED Final   Salmonella species NOT DETECTED NOT DETECTED Final   Yersinia enterocolitica NOT DETECTED NOT DETECTED Final   Vibrio species NOT DETECTED NOT DETECTED Final   Vibrio cholerae NOT DETECTED NOT DETECTED Final   Enteroaggregative E coli (EAEC) NOT DETECTED NOT DETECTED Final   Enteropathogenic E coli (EPEC) NOT DETECTED NOT DETECTED Final   Enterotoxigenic E coli (ETEC) NOT DETECTED NOT DETECTED Final   Shiga like toxin producing E coli (STEC) NOT DETECTED NOT DETECTED Final   Shigella/Enteroinvasive E coli (EIEC)  NOT DETECTED NOT DETECTED Final   Cryptosporidium NOT DETECTED NOT DETECTED Final   Cyclospora cayetanensis NOT DETECTED NOT DETECTED Final   Entamoeba histolytica NOT DETECTED NOT DETECTED Final   Giardia lamblia NOT DETECTED NOT DETECTED Final   Adenovirus F40/41 NOT DETECTED NOT DETECTED Final   Astrovirus NOT DETECTED NOT DETECTED Final   Norovirus GI/GII NOT DETECTED NOT DETECTED Final   Rotavirus A NOT DETECTED NOT DETECTED Final   Sapovirus (I, II, IV, and V) NOT DETECTED NOT DETECTED Final    Comment: Performed at [facility], [address] [place], [place] [address]  STOOL CULTURE REFLEX - RSASHR     Status: None   Collection Time: 05/16/22 12:10 PM  Result Value Ref Range Status   Stool Culture result 1 (RSASHR) Comment  Final    Comment: (NOTE) No Salmonella or Shigella recovered. Performed At: [facility] [address] [place], [place] [id number] Rush Farmer MD [id number]   STOOL CULTURE Reflex - CMPCXR     Status: None   Collection Time: 05/16/22 12:10 PM  Result Value Ref Range Status    Stool Culture result 1 (CMPCXR) Comment  Final    Comment: (NOTE) No Campylobacter species isolated. Performed At: [facility] [place], [place] [id number] Rush Farmer MD [id number]      Radiology Studies: IR Fluoro Guide CV Line Right  Result Date: 05/24/2022 INDICATION: 72 year old female in need of durable dialysis access. She presents for removal temporary dialysis catheter in placement of a tunneled dialysis catheter. EXAM: TUNNELED CENTRAL VENOUS HEMODIALYSIS CATHETER PLACEMENT WITH ULTRASOUND AND FLUOROSCOPIC GUIDANCE MEDICATIONS: 2 g Ancef. The antibiotic was given in an appropriate time interval prior to skin puncture. ANESTHESIA/SEDATION: Moderate (conscious) sedation was employed during this procedure. A total of Versed 1 mg and Fentanyl 25 mcg was administered intravenously. Moderate Sedation Time: 11 minutes. The patient's level of consciousness and vital signs were monitored continuously by radiology nursing throughout the procedure under my direct supervision. FLUOROSCOPY TIME:  Radiation exposure index: 1 mGy reference air kerma COMPLICATIONS: None immediate. PROCEDURE: Informed written consent was obtained from the patient after a discussion of the risks, benefits, and alternatives to treatment. Questions regarding the procedure were encouraged and answered. The right neck and chest were prepped with chlorhexidine in a sterile fashion, and a sterile drape was applied covering the operative field. Maximum barrier sterile technique with sterile gowns and gloves were used for the procedure. A timeout was performed prior to the initiation of the procedure. After creating a small venotomy incision, a micropuncture kit was utilized to access the right internal jugular vein under direct, real-time ultrasound guidance after the overlying soft tissues were anesthetized with 1% lidocaine with epinephrine. Ultrasound image documentation was performed. The microwire was  kinked to measure appropriate catheter length. A stiff Glidewire was advanced to the level of the IVC and the micropuncture sheath was exchanged for a peel-away sheath. A palindrome tunneled hemodialysis catheter measuring 19 cm from tip to cuff was tunneled in a retrograde fashion from the anterior chest wall to the venotomy incision. The catheter was then placed through the peel-away sheath with tips ultimately positioned within the superior aspect of the right atrium. Final catheter positioning was confirmed and documented with a spot radiographic image. The catheter aspirates and flushes normally. The catheter was flushed with appropriate volume heparin dwells. The catheter exit site was secured with a 0-Prolene retention suture. The venotomy incision was closed with an interrupted 4-0 Vicryl, Dermabond  and Steri-strips. Dressings were applied. The patient tolerated the procedure well without immediate post procedural complication. IMPRESSION: Successful placement of 19 cm tip to cuff tunneled hemodialysis catheter via the right internal jugular vein with tips terminating within the superior aspect of the right atrium. The catheter is ready for immediate use. Electronically Signed   By: Jacqulynn Cadet M.D.   On: 05/24/2022 12:39   IR US Guide Vasc Access Right  Result Date: 05/24/2022 INDICATION: 72 year old female in need of durable dialysis access. She presents for removal temporary dialysis catheter in placement of a tunneled dialysis catheter. EXAM: TUNNELED CENTRAL VENOUS HEMODIALYSIS CATHETER PLACEMENT WITH ULTRASOUND AND FLUOROSCOPIC GUIDANCE MEDICATIONS: 2 g Ancef. The antibiotic was given in an appropriate time interval prior to skin puncture. ANESTHESIA/SEDATION: Moderate (conscious) sedation was employed during this procedure. A total of Versed 1 mg and Fentanyl 25 mcg was administered intravenously. Moderate Sedation Time: 11 minutes. The patient's level of consciousness and vital signs were  monitored continuously by radiology nursing throughout the procedure under my direct supervision. FLUOROSCOPY TIME:  Radiation exposure index: 1 mGy reference air kerma COMPLICATIONS: None immediate. PROCEDURE: Informed written consent was obtained from the patient after a discussion of the risks, benefits, and alternatives to treatment. Questions regarding the procedure were encouraged and answered. The right neck and chest were prepped with chlorhexidine in a sterile fashion, and a sterile drape was applied covering the operative field. Maximum barrier sterile technique with sterile gowns and gloves were used for the procedure. A timeout was performed prior to the initiation of the procedure. After creating a small venotomy incision, a micropuncture kit was utilized to access the right internal jugular vein under direct, real-time ultrasound guidance after the overlying soft tissues were anesthetized with 1% lidocaine with epinephrine. Ultrasound image documentation was performed. The microwire was kinked to measure appropriate catheter length. A stiff Glidewire was advanced to the level of the IVC and the micropuncture sheath was exchanged for a peel-away sheath. A palindrome tunneled hemodialysis catheter measuring 19 cm from tip to cuff was tunneled in a retrograde fashion from the anterior chest wall to the venotomy incision. The catheter was then placed through the peel-away sheath with tips ultimately positioned within the superior aspect of the right atrium. Final catheter positioning was confirmed and documented with a spot radiographic image. The catheter aspirates and flushes normally. The catheter was flushed with appropriate volume heparin dwells. The catheter exit site was secured with a 0-Prolene retention suture. The venotomy incision was closed with an interrupted 4-0 Vicryl, Dermabond and Steri-strips. Dressings were applied. The patient tolerated the procedure well without immediate post  procedural complication. IMPRESSION: Successful placement of 19 cm tip to cuff tunneled hemodialysis catheter via the right internal jugular vein with tips terminating within the superior aspect of the right atrium. The catheter is ready for immediate use. Electronically Signed   By: Jacqulynn Cadet M.D.   On: 05/24/2022 12:39   DG CHEST PORT 1 VIEW  Result Date: 05/22/2022 CLINICAL DATA:  Evaluate for intra-abdominal free air. EXAM: PORTABLE CHEST 1 VIEW COMPARISON:  Abdominal radiograph 05/22/2022 FINDINGS: Central venous catheter tip projects over the superior vena cava. Stable cardiac and mediastinal contours. Small layering right pleural effusion with underlying consolidation. No pneumothorax. No definite free air no definite gas identified underlying the hemidiaphragms. IMPRESSION: 1. Small layering right pleural effusion with underlying consolidation. 2. No definite free air identified underlying the hemidiaphragms. Consider further evaluation with decubitus imaging of the abdomen if there is  persistent clinical concern given the limitations of this portable semi-erect chest radiograph. Electronically Signed   By: Lovey Newcomer M.D.   On: 05/22/2022 16:02    Scheduled Meds:  sodium chloride   Intravenous Once   amLODipine  10 mg Oral Daily   calcium acetate  667 mg Oral TID WC   Chlorhexidine Gluconate Cloth  6 each Topical Daily   Chlorhexidine Gluconate Cloth  6 each Topical Q0600   darbepoetin (ARANESP) injection - DIALYSIS  150 mcg Subcutaneous Q Tue-1800   dextromethorphan-guaiFENesin  1 tablet Oral BID   feeding supplement  237 mL Oral BID BM   fentaNYL       hydrALAZINE  50 mg Oral TID   midazolam       [START ON 05/26/2022] pantoprazole  40 mg Intravenous Q12H   potassium chloride  40 mEq Oral BID   predniSONE  60 mg Oral Q breakfast   sodium bicarbonate  1,300 mg Oral BID   sucralfate  1 g Oral TID WC & HS   Continuous Infusions:  albumin human     ceFAZolin     lactated  ringers 10 mL/hr at 05/24/22 1156   pantoprazole 8 mg/hr (05/24/22 1309)   Time Spent: 37 mins    LOS: 8 days   Irwin Brakeman M.D on 05/24/2022 at 3:38 PM  Go to [email or web address] - for contact info  [facility] - Office  [phone]  If 7PM-7AM, please contact night-coverage [email or web address] 05/24/2022, 3:38 PM

## 2022-05-24 NOTE — Progress Notes (Signed)
New Dialysis Start    Patient identified as new dialysis start. Kidney Education packet assembled and given. Discussed the following items with patient:     Current medications and possible changes once started:  Discussed that patient's medications may change over time.  Ex; hypertension medications and diabetes medication.  Nephrologists will adjust as needed.   Fluid restrictions reviewed:  32 oz daily goal:  All liquids count; soups, ice, jello, fruits.   Phosphorus and potassium: Handout given showing high potassium and phosphorus foods.  Alternative food and drink options given.   Family support:  none at bedside, patient states that she has a supportive family.   Outpatient Clinic Resources:  Discussed roles of Outpatient clinic staff and advised to make a list of needs, if any, to talk with outpatient staff if needed.   Care plan schedule: Informed patient of Care Plans in outpatient setting and to participate in the care plan.  An invitation would be given from outpatient clinic.    Dialysis Access Options:  Reviewed access options with patients. Discussed in detail about care at home with new AVG & AVF. Reviewed checking bruit and thrill. If dialysis catheter present, educated that patient could not take showers.  Catheter dressing changes were to be done by outpatient clinic staff only   Home therapy options:  Educated patient about home therapy options:  PD vs home hemo.     Patient verbalized understanding. Will continue to round on patient during admission.  Patient will be going to [facility] at discharge.  Aurther Loft Dialysis Nurse Coordinator [phone]

## 2022-05-25 ENCOUNTER — Inpatient Hospital Stay (HOSPITAL_COMMUNITY): Payer: Medicare Other

## 2022-05-25 ENCOUNTER — Telehealth (INDEPENDENT_AMBULATORY_CARE_PROVIDER_SITE_OTHER): Payer: Self-pay | Admitting: Gastroenterology

## 2022-05-25 ENCOUNTER — Encounter (HOSPITAL_COMMUNITY): Payer: Self-pay | Admitting: Gastroenterology

## 2022-05-25 DIAGNOSIS — K921 Melena: Secondary | ICD-10-CM | POA: Diagnosis not present

## 2022-05-25 DIAGNOSIS — K625 Hemorrhage of anus and rectum: Secondary | ICD-10-CM | POA: Diagnosis not present

## 2022-05-25 DIAGNOSIS — K922 Gastrointestinal hemorrhage, unspecified: Secondary | ICD-10-CM | POA: Diagnosis not present

## 2022-05-25 DIAGNOSIS — D62 Acute posthemorrhagic anemia: Secondary | ICD-10-CM | POA: Diagnosis not present

## 2022-05-25 DIAGNOSIS — J189 Pneumonia, unspecified organism: Secondary | ICD-10-CM | POA: Diagnosis not present

## 2022-05-25 DIAGNOSIS — D500 Iron deficiency anemia secondary to blood loss (chronic): Secondary | ICD-10-CM | POA: Diagnosis not present

## 2022-05-25 LAB — CBC
HCT: 27.9 % — ABNORMAL LOW (ref 36.0–46.0)
Hemoglobin: 9.1 g/dL — ABNORMAL LOW (ref 12.0–15.0)
MCH: 28.3 pg (ref 26.0–34.0)
MCHC: 32.6 g/dL (ref 30.0–36.0)
MCV: 86.9 fL (ref 80.0–100.0)
Platelets: 146 10*3/uL — ABNORMAL LOW (ref 150–400)
RBC: 3.21 MIL/uL — ABNORMAL LOW (ref 3.87–5.11)
RDW: 18.3 % — ABNORMAL HIGH (ref 11.5–15.5)
WBC: 20.4 10*3/uL — ABNORMAL HIGH (ref 4.0–10.5)
nRBC: 0 % (ref 0.0–0.2)

## 2022-05-25 LAB — RENAL FUNCTION PANEL
Albumin: 2.1 g/dL — ABNORMAL LOW (ref 3.5–5.0)
Anion gap: 11 (ref 5–15)
BUN: 37 mg/dL — ABNORMAL HIGH (ref 8–23)
CO2: 23 mmol/L (ref 22–32)
Calcium: 7.2 mg/dL — ABNORMAL LOW (ref 8.9–10.3)
Chloride: 100 mmol/L (ref 98–111)
Creatinine, Ser: 3.1 mg/dL — ABNORMAL HIGH (ref 0.44–1.00)
GFR, Estimated: 15 mL/min — ABNORMAL LOW (ref >60)
Glucose, Bld: 70 mg/dL (ref 70–99)
Phosphorus: 3.5 mg/dL (ref 2.5–4.6)
Potassium: 3.5 mmol/L (ref 3.5–5.1)
Sodium: 134 mmol/L — ABNORMAL LOW (ref 135–145)

## 2022-05-25 LAB — LIPASE, BLOOD: Lipase: 41 U/L (ref 11–51)

## 2022-05-25 MED ORDER — PROSOURCE PLUS PO LIQD
30.0000 mL | Freq: Three times a day (TID) | ORAL | Status: DC
  Administered 2022-05-26 – 2022-05-27 (×3): 30 mL via ORAL
  Filled 2022-05-25 (×7): qty 30

## 2022-05-25 MED ORDER — BOOST / RESOURCE BREEZE PO LIQD CUSTOM
1.0000 | Freq: Three times a day (TID) | ORAL | Status: DC
  Administered 2022-05-25 – 2022-05-27 (×3): 1 via ORAL

## 2022-05-25 MED ORDER — MORPHINE SULFATE (PF) 2 MG/ML IV SOLN
2.0000 mg | Freq: Once | INTRAVENOUS | Status: AC
  Administered 2022-05-25: 2 mg via INTRAVENOUS
  Filled 2022-05-25: qty 1

## 2022-05-25 MED ORDER — HEPARIN SODIUM (PORCINE) 1000 UNIT/ML IJ SOLN
INTRAMUSCULAR | Status: AC
  Administered 2022-05-25: 3200 [IU]
  Filled 2022-05-25: qty 4

## 2022-05-25 MED ORDER — FENTANYL CITRATE PF 50 MCG/ML IJ SOSY
50.0000 ug | PREFILLED_SYRINGE | INTRAMUSCULAR | Status: DC | PRN
  Filled 2022-05-25: qty 1

## 2022-05-25 MED ORDER — DARBEPOETIN ALFA 150 MCG/0.3ML IJ SOSY
PREFILLED_SYRINGE | INTRAMUSCULAR | Status: AC
  Administered 2022-05-25: 150 ug via SUBCUTANEOUS
  Filled 2022-05-25: qty 0.3

## 2022-05-25 MED ORDER — FENTANYL CITRATE PF 50 MCG/ML IJ SOSY
50.0000 ug | PREFILLED_SYRINGE | INTRAMUSCULAR | Status: DC | PRN
  Administered 2022-05-25: 50 ug via INTRAVENOUS

## 2022-05-25 NOTE — Progress Notes (Signed)
Around 0535 patient started to c/o of a sudden stabbing pain in her epigastric region. She denies chest pain, n/v, and states that the pain does not spread. Notified Dr. Clearence Ped and CN Steele Sizer, RN. See new orders.

## 2022-05-25 NOTE — Progress Notes (Addendum)
Initial Nutrition Assessment  DOCUMENTATION CODES:      INTERVENTION:  Handouts: Nutrition for People on Dialysis   Sodium and Kidney disease   Healthy eating with Kidney disease  Boost Breeze TID and Protein Modular TID while on clears  Renal vitamin  NUTRITION DIAGNOSIS:   Food and nutrition related knowledge deficit related to limited prior education as evidenced by  (New diaylsis). -addressed by providing education  GOAL:   (Patient will comply with Kidney diet)- once diet is advanced   MONITOR:  Labs, I & O's, Supplement acceptance  REASON FOR ASSESSMENT:   Consult Diet education (New Diaylsis)  ASSESSMENT: Patient is a 72 yo female with hx of COPD, RA, anemia, ESRD- new HD. Presented with shortness of breath on 1/14 (Chest x-ray- PE) and PNA. Hospitalized at [facility] 1/5->1/10 COPD exacerbation, HCAP, AKI.   Procedures: 1/16 Hemodialysis right IJ HD catheter insertion 1/17 [facility] for renal biopsy 1/22 [facility]-IR Placement of 19 cm Palindrome TDC   Per Chart: 1/19 Hemoglobin 6.1 and fecal occult -positive. Patient received 3 units PRBC's.   Patient has been on NPO or clear liquids most of this hospitalization LOS day 9. Nausea and epigastric pain this morning. Patient had EKG done and given Fentanyl for pain.   She is not feeling well during RD visit to provide kidney diet education. Her eyes were closed most of the time but says, "I'm listening." Handouts provided noted above and RD contact provided if she has questions when she is feeling more like participating.    If patient continues to be unable to advance diet consider alternate nutrition support measures.  Patient on admission 57.2 kg. Currently 52.8 kg. First hemodialysis treatment on 05/18/22.   Medications: reviewed. Phoslo, Prednisone, sodium bicarbonate, sucralfate with meals. Albumin prn     Latest Ref Rng & Units 05/25/2022    4:04 AM 05/24/2022    4:39 AM 05/23/2022    4:14 AM  BMP  Glucose 70 - 99  mg/dL 70  78  108   BUN 8 - 23 mg/dL 37  35  69   Creatinine 0.44 - 1.00 mg/dL 3.10  2.54  3.54   Sodium 135 - 145 mmol/L 134  133  135   Potassium 3.5 - 5.1 mmol/L 3.5  2.9  3.6   Chloride 98 - 111 mmol/L 100  99  101   CO2 22 - 32 mmol/L '23  24  24   '$ Calcium 8.9 - 10.3 mg/dL 7.2  7.1  7.4       Diet Order:   Diet Order             Diet clear liquid Room service appropriate? Yes; Fluid consistency: Thin  Diet effective now                   EDUCATION NEEDS:  Education needs have been addressed  Skin:  Skin Assessment: Reviewed RN Assessment  Last BM:  1/22  Height:   Ht Readings from Last 1 Encounters:  05/16/22 '4\' 11"'$  (1.499 m)    Weight:   Wt Readings from Last 1 Encounters:  05/25/22 52.8 kg    Ideal Body Weight:   45 kg  BMI:  Body mass index is 23.51 kg/m.  Estimated Nutritional Needs:   Kcal:  1800-1900  Protein:  90-98 gr  Fluid:  1000 ml plus urine output   Colman Cater MS,RD,CSG,LDN Contact: AMION

## 2022-05-25 NOTE — Progress Notes (Signed)
Patient ID: Sheri Cooper, female   DOB: 05-Feb-1951, 72 y.o.   MRN: [id number] S: She was complaining of nausea and epigastric pain this morning and did not take meds.  Had [facility] placed at [facility] IR yesterday. O:BP (!) 151/51 (BP Location: Left Arm)   Pulse 81   Temp 98.7 F (37.1 C)   Resp 18   Ht '4\' 11"'$  (1.499 m)   Wt 52.8 kg   SpO2 94%   BMI 23.51 kg/m   Intake/Output Summary (Last 24 hours) at 05/25/2022 1000 Last data filed at 05/25/2022 0300 Gross per 24 hour  Intake 391.11 ml  Output --  Net 391.11 ml   Intake/Output: I/O last 3 completed shifts: In: 751.1 [P.O.:360; I.V.:391.1] Out: 0   Intake/Output this shift:  No intake/output data recorded. Weight change: 1.5 kg Gen: NAD CVS: RRR Resp: CTA Abd: +BS, soft, NT/ND Ext: trace pretibial edema bilaterally  Recent Labs  Lab 05/19/22 0433 05/20/22 0501 05/21/22 0456 05/22/22 0346 05/22/22 1440 05/23/22 0414 05/24/22 0439 05/25/22 0404  NA 139 138 135 135  --  135 133* 134*  K 3.4* 3.2* 3.0* 2.6* 3.0* 3.6 2.9* 3.5  CL 105 106 101 99  --  101 99 100  CO2 17* 15* 22 25  --  '24 24 23  '$ GLUCOSE 100* 141* 120* 96  --  108* 78 70  BUN 85* 113* 78* 47*  --  69* 35* 37*  CREATININE 5.57* 5.92* 4.03* 2.73*  --  3.54* 2.54* 3.10*  ALBUMIN 2.2* 2.1* 1.9* 2.1*  --  2.1* 2.1* 2.1*  CALCIUM 7.0* 6.8* 6.8* 7.2*  --  7.4* 7.1* 7.2*  PHOS 8.4* 8.8* 5.2* 3.6  --  4.5 2.8 3.5   Liver Function Tests: Recent Labs  Lab 05/23/22 0414 05/24/22 0439 05/25/22 0404  ALBUMIN 2.1* 2.1* 2.1*   Recent Labs  Lab 05/25/22 0404  LIPASE 41   No results for input(s): "AMMONIA" in the last 168 hours. CBC: Recent Labs  Lab 05/21/22 0456 05/21/22 1601 05/22/22 0346 05/23/22 0414 05/24/22 0439 05/25/22 0404  WBC 15.4*  --  24.0* 20.1* 29.7* 20.4*  HGB 6.1*   < > 9.8* 8.7* 8.2* 9.1*  HCT 17.5*   < > 27.9* 25.4* 24.3* 27.9*  MCV 78.8*  --  81.6 82.7 85.9 86.9  PLT 109*  --  134* 145* 140* 146*   < > = values in this interval  not displayed.   Cardiac Enzymes: No results for input(s): "CKTOTAL", "CKMB", "CKMBINDEX", "TROPONINI" in the last 168 hours. CBG: No results for input(s): "GLUCAP" in the last 168 hours.  Iron Studies: No results for input(s): "IRON", "TIBC", "TRANSFERRIN", "FERRITIN" in the last 72 hours. Studies/Results: DG Abd 1 View  Result Date: 05/25/2022 CLINICAL DATA:  Abdominal pain EXAM: ABDOMEN - 1 VIEW COMPARISON:  05/22/2022 FINDINGS: The bowel gas pattern is normal. No radio-opaque calculi or other significant radiographic abnormality are seen. Surgical clips overlie the pelvis and right upper quadrant. Calcification consistent with fibroid noted. IMPRESSION: Unremarkable bowel gas pattern. Calcified fibroid incidentally noted Electronically Signed   By: Sammie Bench M.D.   On: 05/25/2022 08:22   IR Fluoro Guide CV Line Right  Result Date: 05/24/2022 INDICATION: 72 year old female in need of durable dialysis access. She presents for removal temporary dialysis catheter in placement of a tunneled dialysis catheter. EXAM: TUNNELED CENTRAL VENOUS HEMODIALYSIS CATHETER PLACEMENT WITH ULTRASOUND AND FLUOROSCOPIC GUIDANCE MEDICATIONS: 2 g Ancef. The antibiotic was given in an appropriate time interval  prior to skin puncture. ANESTHESIA/SEDATION: Moderate (conscious) sedation was employed during this procedure. A total of Versed 1 mg and Fentanyl 25 mcg was administered intravenously. Moderate Sedation Time: 11 minutes. The patient's level of consciousness and vital signs were monitored continuously by radiology nursing throughout the procedure under my direct supervision. FLUOROSCOPY TIME:  Radiation exposure index: 1 mGy reference air kerma COMPLICATIONS: None immediate. PROCEDURE: Informed written consent was obtained from the patient after a discussion of the risks, benefits, and alternatives to treatment. Questions regarding the procedure were encouraged and answered. The right neck and chest were  prepped with chlorhexidine in a sterile fashion, and a sterile drape was applied covering the operative field. Maximum barrier sterile technique with sterile gowns and gloves were used for the procedure. A timeout was performed prior to the initiation of the procedure. After creating a small venotomy incision, a micropuncture kit was utilized to access the right internal jugular vein under direct, real-time ultrasound guidance after the overlying soft tissues were anesthetized with 1% lidocaine with epinephrine. Ultrasound image documentation was performed. The microwire was kinked to measure appropriate catheter length. A stiff Glidewire was advanced to the level of the IVC and the micropuncture sheath was exchanged for a peel-away sheath. A palindrome tunneled hemodialysis catheter measuring 19 cm from tip to cuff was tunneled in a retrograde fashion from the anterior chest wall to the venotomy incision. The catheter was then placed through the peel-away sheath with tips ultimately positioned within the superior aspect of the right atrium. Final catheter positioning was confirmed and documented with a spot radiographic image. The catheter aspirates and flushes normally. The catheter was flushed with appropriate volume heparin dwells. The catheter exit site was secured with a 0-Prolene retention suture. The venotomy incision was closed with an interrupted 4-0 Vicryl, Dermabond and Steri-strips. Dressings were applied. The patient tolerated the procedure well without immediate post procedural complication. IMPRESSION: Successful placement of 19 cm tip to cuff tunneled hemodialysis catheter via the right internal jugular vein with tips terminating within the superior aspect of the right atrium. The catheter is ready for immediate use. Electronically Signed   By: Jacqulynn Cadet M.D.   On: 05/24/2022 12:39   IR US Guide Vasc Access Right  Result Date: 05/24/2022 INDICATION: 72 year old female in need of durable  dialysis access. She presents for removal temporary dialysis catheter in placement of a tunneled dialysis catheter. EXAM: TUNNELED CENTRAL VENOUS HEMODIALYSIS CATHETER PLACEMENT WITH ULTRASOUND AND FLUOROSCOPIC GUIDANCE MEDICATIONS: 2 g Ancef. The antibiotic was given in an appropriate time interval prior to skin puncture. ANESTHESIA/SEDATION: Moderate (conscious) sedation was employed during this procedure. A total of Versed 1 mg and Fentanyl 25 mcg was administered intravenously. Moderate Sedation Time: 11 minutes. The patient's level of consciousness and vital signs were monitored continuously by radiology nursing throughout the procedure under my direct supervision. FLUOROSCOPY TIME:  Radiation exposure index: 1 mGy reference air kerma COMPLICATIONS: None immediate. PROCEDURE: Informed written consent was obtained from the patient after a discussion of the risks, benefits, and alternatives to treatment. Questions regarding the procedure were encouraged and answered. The right neck and chest were prepped with chlorhexidine in a sterile fashion, and a sterile drape was applied covering the operative field. Maximum barrier sterile technique with sterile gowns and gloves were used for the procedure. A timeout was performed prior to the initiation of the procedure. After creating a small venotomy incision, a micropuncture kit was utilized to access the right internal jugular vein under direct, real-time  ultrasound guidance after the overlying soft tissues were anesthetized with 1% lidocaine with epinephrine. Ultrasound image documentation was performed. The microwire was kinked to measure appropriate catheter length. A stiff Glidewire was advanced to the level of the IVC and the micropuncture sheath was exchanged for a peel-away sheath. A palindrome tunneled hemodialysis catheter measuring 19 cm from tip to cuff was tunneled in a retrograde fashion from the anterior chest wall to the venotomy incision. The catheter  was then placed through the peel-away sheath with tips ultimately positioned within the superior aspect of the right atrium. Final catheter positioning was confirmed and documented with a spot radiographic image. The catheter aspirates and flushes normally. The catheter was flushed with appropriate volume heparin dwells. The catheter exit site was secured with a 0-Prolene retention suture. The venotomy incision was closed with an interrupted 4-0 Vicryl, Dermabond and Steri-strips. Dressings were applied. The patient tolerated the procedure well without immediate post procedural complication. IMPRESSION: Successful placement of 19 cm tip to cuff tunneled hemodialysis catheter via the right internal jugular vein with tips terminating within the superior aspect of the right atrium. The catheter is ready for immediate use. Electronically Signed   By: Jacqulynn Cadet M.D.   On: 05/24/2022 12:39    amLODipine  10 mg Oral Daily   calcium acetate  667 mg Oral TID WC   Chlorhexidine Gluconate Cloth  6 each Topical Daily   Chlorhexidine Gluconate Cloth  6 each Topical Q0600   darbepoetin (ARANESP) injection - DIALYSIS  150 mcg Subcutaneous Q Tue-1800   dextromethorphan-guaiFENesin  1 tablet Oral BID   feeding supplement  237 mL Oral BID BM   hydrALAZINE  50 mg Oral TID   [START ON 05/26/2022] pantoprazole  40 mg Intravenous Q12H   potassium chloride  40 mEq Oral BID   predniSONE  60 mg Oral Q breakfast   sodium bicarbonate  1,300 mg Oral BID   sucralfate  1 g Oral TID WC & HS    BMET    Component Value Date/Time   NA 134 (L) 05/25/2022 0404   K 3.5 05/25/2022 0404   CL 100 05/25/2022 0404   CO2 23 05/25/2022 0404   GLUCOSE 70 05/25/2022 0404   BUN 37 (H) 05/25/2022 0404   CREATININE 3.10 (H) 05/25/2022 0404   CALCIUM 7.2 (L) 05/25/2022 0404   GFRNONAA 15 (L) 05/25/2022 0404   CBC    Component Value Date/Time   WBC 20.4 (H) 05/25/2022 0404   RBC 3.21 (L) 05/25/2022 0404   HGB 9.1 (L)  05/25/2022 0404   HCT 27.9 (L) 05/25/2022 0404   PLT 146 (L) 05/25/2022 0404   MCV 86.9 05/25/2022 0404   MCH 28.3 05/25/2022 0404   MCHC 32.6 05/25/2022 0404   RDW 18.3 (H) 05/25/2022 0404   LYMPHSABS 1.4 05/16/2022 0309   MONOABS 0.6 05/16/2022 0309   EOSABS 0.0 05/16/2022 0309   BASOSABS 0.1 05/16/2022 0309    Assessment/Plan:   AKI/CKD stage IIIb - rapidly progressive.  ANCA, ANA, anti-GBM neg, C3/C4 WNL. HIV, Hep B neg. FLC within acceptable limits. SPEP pending.  She underwent her first HD session on 05/18/22 via temp RIJ HD catheter and started on IV solumedrol x 3.  She underwent US-guided kidney biopsy on 05/19/22.  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 Plan: stay the course with prednisone 60 mg daily for now, will need prolonged taper course. EM pending.   S/P TDC placement by IR 05/24/22 at [facility].  Will continue with IHD on TTS schedule for now.  She has a chair at [employer] TTS pm shift.  Will need to be there at 10:30 am on 06/01/22.  Avoid nephrotoxic medications including NSAIDs and iodinated intravenous contrast exposure unless the latter is absolutely indicated.   Preferred narcotic agents for pain control are hydromorphone, fentanyl, and methadone. Morphine should not be used.  Avoid Baclofen and avoid oral sodium phosphate and magnesium citrate based laxatives / bowel preps.  Continue strict Input and Output monitoring. Will monitor the patient closely with you and intervene or adjust therapy as indicated by changes in clinical status/labs   HCAP and COPD exacerbation - abx per primary. Anemia of CKD stage V - started on ESA and transfuse prn.  FOBT +, GI consulted.  EGD with grade C esophagitis and gastritis with small nonbleeding ulcers in fundus.  2 ulcers in the duodenum with large adherent clots with underlying bleeding vessels treated with epi and coagulation.  One vessel treated also with a clip, however second vessel could not be clipped.  Currently on PPI.  Hgb stable at 9.1.  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  No heparin. Hypokalemia - replete orally and follow.  Will use 4K bath with HD tomorrow.  Disposition - has outpatient spot on 06/01/22 at [facility].   Donetta Potts, MD [facility]

## 2022-05-25 NOTE — Progress Notes (Addendum)
PROGRESS NOTE   Sheri Cooper, is a 72 y.o. female, DOB - 08/16/1950, UKG:[id number]  Admit date - 05/16/2022   Admitting Physician Bernadette Hoit, DO  Outpatient Primary MD for the patient is [facility]  LOS - 9  Chief Complaint  Patient presents with   Shortness of Breath      Brief Summary:-  72 y.o. female with medical history significant of COPD, rheumatoid arthritis, stage V CKD. Admitted on 05/16/2022 with healthcare associated pneumonia and worsening renal function with anion gap metabolic acidosis.  -Patient was recently discharged from [facility] on 05/12/2022 after treatment for COPD.  Pt was started on hemodialysis on 05/18/22 after temporary central line placement and had renal biopsy done on 05/19/22.     A/p HCAP-- TREATED  - recent hospitalization at [facility] as outlined above -COVID, flu and RSV negative -Leukocytosis and elevated procalcitonin noted WBC 15.3 >> 19.8 >>20.2 -completed last dose cefepime 05/20/21 -Bronchodilators and mucolytics as ordered    AKI on CKD 5--very poor urine output, significant metabolic acidosis noted, becoming more sleepy - now transitioned to hemodialysis  -Anion gap metabolic acidosis noted and sodium bicarbonate -patient sees Dr. Theador Hawthorne as outpatient---discussed with Dr. Lars Masson was concerned that patient would need hemodialysis this admission which has now been started -Care Everywhere [facility] from [facility] reviewed -Right IJ HD catheter placed by Dr. 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   -per renal team, continue steroids for now   COPD - stable  -continue bronchodilators -Pt is on high dose steroids per  nephrology for AKI  Leukocytosis  - I think this is a leukemoid reaction from high dose steroids that patient is on - we have not found any evidence of infection at this time  - we are following CBC as WBC trending down  Hypokalemia -repleted orally  -recheck in AM  Nausea and epigastric pain -discussed with GI - this is likely coming from the severe ulceration she has in her stomach and duodenum which required sclerotherapy, clipping and ablation  -continue compazine for nausea; held off on giving phenergan and zofran due to prolonged Qtc.   -IV pain medication as needed  -continue protonix infusion to hopefully heal ulcerations  Acute blood loss Anemia / Anemia in CKD -Ferritin and serum iron are not low -Hg down to 6.3, transfused 1 unit PRBC with HD on 1/18, had large bloody BM, Hg down to 6.1 1/19, transfuse 2 units PRBC with HD on 1/19  -recheck Hg improved to 9.8, now trended down to 8.7 after EGD -follow CBC daily; if recurrent GI bleed or signs of hemodynamic instability GI team recommends getting a CT angio bleed protocol STAT and to consult IR if active bleeding identified  Guaiac Positive Stools / GI bleeding Bleeding duodenal ulcers s/p clip x1, epinephrine and coagulation treatments Esophagitis and Gastritis  -large bloody BM noted by RN guaiac positive on 1/18 -DC'd subcutaneous heparin on 1/18 -transfused 1 unit PRBC with HD on 1/18, Hg down again to 6.1, transfused 2 unit PRBC 1/19 with HD  -GI team consulted to eval 1/19 -transfused 2 units PRBC with HD 1/19, Hg up to 9.8 -EGD / Colonoscopy planned for 05/22/22 with Dr. Jenetta Downer - with findings of grade  C esophagitis and gastritis with ulcers, multiple duodenal ulcers with blood clots, bleeding vessel clip x 1, epinephrine injection given and coagulation.   Thrombocytopenia - resolved now - we stopped [place] heparin on 1/18 - checking HIT antibody labs - pending results - platelets improved to 145 after stopping all  heparins  - "pharmacy on HIT alert" order placed    Incidental finding of multiple renal lesions CT angiography of chest showed multiple renal lesions bilaterally, incompletely characterized on today's noncontrast CT examination, including an indeterminate 1.9 cm lesion in the anterior aspect of the interpolar region of the left kidney.  Follow-up nonemergent outpatient abdominal MRI with and without IV gadolinium is recommended in the near future to definitively evaluate this lesion and exclude neoplasm   Elevated BNP--echo from 05/10/2022 at [facility] available in [place] reveals-  The left ventricle is normal in size with normal wall thickness. The left ventricular systolic function is normal, LVEF is visually estimated at 60-65%.  There is normal left ventricular diastolic function.  -There is mild to moderate pulmonary hypertension no significant valvular abnormalities -hemodialysis to manage volume status   Diarrhea - resolved now;  - c diff testing was negative   Procedures:- -Right IJ HD catheter placed on 05/18/22 -Initiated on hemodialysis on 05/18/2022 -completed IR renal biopsy on 05/19/2022 at [facility] -EGD/colonoscopy completed 05/22/2022   Status is: Inpatient   Disposition: The patient is from: Home              Anticipated d/c is to: Home --clipped to an outpatient HD center TTS starting 06/01/22 [facility]              Anticipated d/c date is: > 3 days              Patient currently is not medically stable to d/c. Barriers: Not Clinically Stable-  Code Status :  -  Code Status: Full Code   Family Communication:    NA (patient is alert, awake and coherent)   DVT Prophylaxis  :   - SCDs   SCDs Start: 05/16/22 0644   Lab Results  Component Value Date   PLT 146 (L) 05/25/2022   Inpatient Medications  Scheduled Meds:  (feeding supplement) PROSource Plus  30 mL Oral TID BM   amLODipine  10 mg Oral Daily   calcium acetate  667 mg Oral TID WC    Chlorhexidine Gluconate Cloth  6 each Topical Daily   Chlorhexidine Gluconate Cloth  6 each Topical Q0600   darbepoetin (ARANESP) injection - DIALYSIS  150 mcg Subcutaneous Q Tue-1800   dextromethorphan-guaiFENesin  1 tablet Oral BID   feeding supplement  1 Container Oral TID BM   hydrALAZINE  50 mg Oral TID   [START ON 05/26/2022] pantoprazole  40 mg Intravenous Q12H   potassium chloride  40 mEq Oral BID   predniSONE  60 mg Oral Q breakfast   sodium bicarbonate  1,300 mg Oral BID   sucralfate  1 g Oral TID WC & HS   Continuous Infusions:  albumin human     pantoprazole 8 mg/hr (05/25/22 1006)   PRN Meds:.acetaminophen **OR** acetaminophen, albumin human, alteplase, fentaNYL (SUBLIMAZE) injection, heparin, ipratropium-albuterol, labetalol, loperamide, oxyCODONE, prochlorperazine   Anti-infectives (From admission, onward)    Start     Dose/Rate Route Frequency Ordered Stop   05/24/22 1230  ceFAZolin (ANCEF) IVPB 2g/100 mL premix        2 g 200 mL/hr over 30 Minutes Intravenous  Once  05/24/22 0916 05/24/22 1227   05/24/22 0920  ceFAZolin (ANCEF) 2-4 GM/100ML-% IVPB       Note to Pharmacy: Bonnielee Haff C: cabinet override      05/24/22 0920 05/24/22 2129   05/17/22 0800  ceFEPIme (MAXIPIME) 1 g in sodium chloride 0.9 % 100 mL IVPB  Status:  Discontinued        1 g 200 mL/hr over 30 Minutes Intravenous Every 24 hours 05/16/22 0641 05/20/22 1012   05/16/22 0641  vancomycin variable dose per unstable renal function (pharmacist dosing)  Status:  Discontinued         Does not apply See admin instructions 05/16/22 0641 05/17/22 1605   05/16/22 0445  vancomycin (VANCOREADY) IVPB 1250 mg/250 mL        1,250 mg 166.7 mL/hr over 90 Minutes Intravenous  Once 05/16/22 0444 05/16/22 0732   05/16/22 0430  ceFEPIme (MAXIPIME) 2 g in sodium chloride 0.9 % 100 mL IVPB        2 g 200 mL/hr over 30 Minutes Intravenous  Once 05/16/22 0426 05/16/22 0534       Subjective: Dietrich Lince having  nausea and epigastric pains today.        Objective: Vitals:   05/25/22 0419 05/25/22 0500 05/25/22 0542 05/25/22 1121  BP: (!) 151/51  (!) 151/51 (!) 151/59  Pulse: 73  81 81  Resp: 18   18  Temp: 98.7 F (37.1 C)   98.4 F (36.9 C)  TempSrc:    Oral  SpO2: 96%  94% 99%  Weight:  52.8 kg    Height:        Intake/Output Summary (Last 24 hours) at 05/25/2022 1401 Last data filed at 05/25/2022 0300 Gross per 24 hour  Intake 151.11 ml  Output --  Net 151.11 ml   Filed Weights   05/22/22 0500 05/23/22 1210 05/25/22 0500  Weight: 50.8 kg 51.3 kg 52.8 kg    Physical Exam  Gen: awake, alert, cooperative, NAD; appears less pale today  HEENT: NCAT; pERRL, sclera white.  Neck-Supple Neck, right TDC in place; Lungs-BBS shallow but clear to auscultation   CV- normal s1, s2 sounds;   Abd-  normal BS, ND/NT, no HSM   Extremity/Skin:-  1+ edema,  normal pedal pulses present  Psych-flat affect unchanged Neuro-generalized weakness no new focal deficits, no tremors  Data Reviewed: I have personally reviewed following labs and imaging studies  CBC: Recent Labs  Lab 05/21/22 0456 05/21/22 1601 05/22/22 0346 05/23/22 0414 05/24/22 0439 05/25/22 0404  WBC 15.4*  --  24.0* 20.1* 29.7* 20.4*  HGB 6.1* 10.5* 9.8* 8.7* 8.2* 9.1*  HCT 17.5* 29.8* 27.9* 25.4* 24.3* 27.9*  MCV 78.8*  --  81.6 82.7 85.9 86.9  PLT 109*  --  134* 145* 140* 130*   Basic Metabolic Panel: Recent Labs  Lab 05/20/22 0501 05/21/22 0456 05/22/22 0346 05/22/22 1039 05/22/22 1440 05/23/22 0414 05/24/22 0439 05/25/22 0404  NA 138 135 135  --   --  135 133* 134*  K 3.2* 3.0* 2.6*  --  3.0* 3.6 2.9* 3.5  CL 106 101 99  --   --  101 99 100  CO2 15* 22 25  --   --  '24 24 23  '$ GLUCOSE 141* 120* 96  --   --  108* 78 70  BUN 113* 78* 47*  --   --  69* 35* 37*  CREATININE 5.92* 4.03* 2.73*  --   --  3.54* 2.54*  3.10*  CALCIUM 6.8* 6.8* 7.2*  --   --  7.4* 7.1* 7.2*  MG 2.2  --   --  1.7  --  2.2 2.0  --    PHOS 8.8* 5.2* 3.6  --   --  4.5 2.8 3.5   GFR: Estimated Creatinine Clearance: 12.2 mL/min (A) (by C-G formula based on SCr of 3.1 mg/dL (H)). Liver Function Tests: Recent Labs  Lab 05/21/22 0456 05/22/22 0346 05/23/22 0414 05/24/22 0439 05/25/22 0404  ALBUMIN 1.9* 2.1* 2.1* 2.1* 2.1*   Recent Results (from the past 240 hour(s))  Resp panel by RT-PCR (RSV, Flu A&B, Covid) Anterior Nasal Swab     Status: None   Collection Time: 05/16/22  3:33 AM   Specimen: Anterior Nasal Swab  Result Value Ref Range Status   SARS Coronavirus 2 by RT PCR NEGATIVE NEGATIVE Final    Comment: (NOTE) SARS-CoV-2 target nucleic acids are NOT DETECTED.  The SARS-CoV-2 RNA is generally detectable in upper respiratory specimens during the acute phase of infection. The lowest concentration of SARS-CoV-2 viral copies this assay can detect is 138 copies/mL. A negative result does not preclude SARS-Cov-2 infection and should not be used as the sole basis for treatment or other patient management decisions. A negative result may occur with  improper specimen collection/handling, submission of specimen other than nasopharyngeal swab, presence of viral mutation(s) within the areas targeted by this assay, and inadequate number of viral copies(<138 copies/mL). A negative result must be combined with clinical observations, patient history, and epidemiological information. The expected result is Negative.  Fact Sheet for Patients:  EntrepreneurPulse.com.au  Fact Sheet for Healthcare Providers:  IncredibleEmployment.be  This test is no t yet approved or cleared by the [place] FDA and  has been authorized for detection and/or diagnosis of SARS-CoV-2 by FDA under an Emergency Use Authorization (EUA). This EUA will remain  in effect (meaning this test can be used) for the duration of the COVID-19 declaration under Section 564(b)(1) of the Act, 21 U.S.C.section  360bbb-3(b)(1), unless the authorization is terminated  or revoked sooner.       Influenza A by PCR NEGATIVE NEGATIVE Final   Influenza B by PCR NEGATIVE NEGATIVE Final    Comment: (NOTE) The Xpert Xpress SARS-CoV-2/FLU/RSV plus assay is intended as an aid in the diagnosis of influenza from Nasopharyngeal swab specimens and should not be used as a sole basis for treatment. Nasal washings and aspirates are unacceptable for Xpert Xpress SARS-CoV-2/FLU/RSV testing.  Fact Sheet for Patients: EntrepreneurPulse.com.au  Fact Sheet for Healthcare Providers: IncredibleEmployment.be  This test is not yet approved or cleared by the [place] FDA and has been authorized for detection and/or diagnosis of SARS-CoV-2 by FDA under an Emergency Use Authorization (EUA). This EUA will remain in effect (meaning this test can be used) for the duration of the COVID-19 declaration under Section 564(b)(1) of the Act, 21 U.S.C. section 360bbb-3(b)(1), unless the authorization is terminated or revoked.     Resp Syncytial Virus by PCR NEGATIVE NEGATIVE Final    Comment: (NOTE) Fact Sheet for Patients: EntrepreneurPulse.com.au  Fact Sheet for Healthcare Providers: IncredibleEmployment.be  This test is not yet approved or cleared by the [place] FDA and has been authorized for detection and/or diagnosis of SARS-CoV-2 by FDA under an Emergency Use Authorization (EUA). This EUA will remain in effect (meaning this test can be used) for the duration of the COVID-19 declaration under Section 564(b)(1) of the Act, 21 U.S.C. section 360bbb-3(b)(1), unless the  authorization is terminated or revoked.  Performed at [facility], [address], [place], [place] [address]   Culture, blood (routine x 2) Call MD if unable to obtain prior to antibiotics being given     Status: None   Collection Time: 05/16/22  6:44 AM   Specimen:  BLOOD RIGHT FOREARM  Result Value Ref Range Status   Specimen Description   Final    BLOOD RIGHT FOREARM BOTTLES DRAWN AEROBIC AND ANAEROBIC   Special Requests Blood Culture adequate volume  Final   Culture   Final    NO GROWTH 5 DAYS Performed at [facility], [address], [place], [place] [address]    Report Status 05/21/2022 FINAL  Final  Culture, blood (routine x 2) Call MD if unable to obtain prior to antibiotics being given     Status: None   Collection Time: 05/16/22  7:11 AM   Specimen: BLOOD LEFT ARM  Result Value Ref Range Status   Specimen Description BLOOD LEFT ARM BOTTLES DRAWN AEROBIC AND ANAEROBIC  Final   Special Requests Blood Culture adequate volume  Final   Culture   Final    NO GROWTH 5 DAYS Performed at [facility], [address] [place], [place] [address]    Report Status 05/21/2022 FINAL  Final  MRSA Next Gen by PCR, Nasal     Status: None   Collection Time: 05/16/22 12:10 PM   Specimen: Nasal Mucosa; Nasal Swab  Result Value Ref Range Status   MRSA by PCR Next Gen NOT DETECTED NOT DETECTED Final    Comment: (NOTE) The GeneXpert MRSA Assay (FDA approved for NASAL specimens only), is one component of a comprehensive MRSA colonization surveillance program. It is not intended to diagnose MRSA infection nor to guide or monitor treatment for MRSA infections. Test performance is not FDA approved in patients less than 23 years old. Performed at [facility], [address] [place], [place] [address]   C Difficile Quick Screen w PCR reflex     Status: None   Collection Time: 05/16/22 12:10 PM   Specimen: STOOL  Result Value Ref Range Status   C Diff antigen NEGATIVE NEGATIVE Final   C Diff toxin NEGATIVE NEGATIVE Final   C Diff interpretation No C. difficile detected.  Final    Comment: Performed at [facility], [address], [place], [place] [address]  Stool culture     Status: None   Collection Time: 05/16/22 12:10 PM   Specimen: Stool  Result Value  Ref Range Status   Salmonella/Shigella Screen Final report  Final   Campylobacter Culture Final report  Final   E coli, Shiga toxin Assay Negative Negative Final    Comment: (NOTE) Performed At: [facility] [place], [place] [id number] Rush Farmer MD JK:[id number]   Gastrointestinal Panel by PCR , Stool     Status: None   Collection Time: 05/16/22 12:10 PM   Specimen: Stool  Result Value Ref Range Status   Campylobacter species NOT DETECTED NOT DETECTED Final   Plesimonas shigelloides NOT DETECTED NOT DETECTED Final   Salmonella species NOT DETECTED NOT DETECTED Final   Yersinia enterocolitica NOT DETECTED NOT DETECTED Final   Vibrio species NOT DETECTED NOT DETECTED Final   Vibrio cholerae NOT DETECTED NOT DETECTED Final   Enteroaggregative E coli (EAEC) NOT DETECTED NOT DETECTED Final   Enteropathogenic E coli (EPEC) NOT DETECTED NOT DETECTED Final   Enterotoxigenic E coli (ETEC) NOT DETECTED NOT DETECTED Final  Shiga like toxin producing E coli (STEC) NOT DETECTED NOT DETECTED Final   Shigella/Enteroinvasive E coli (EIEC) NOT DETECTED NOT DETECTED Final   Cryptosporidium NOT DETECTED NOT DETECTED Final   Cyclospora cayetanensis NOT DETECTED NOT DETECTED Final   Entamoeba histolytica NOT DETECTED NOT DETECTED Final   Giardia lamblia NOT DETECTED NOT DETECTED Final   Adenovirus F40/41 NOT DETECTED NOT DETECTED Final   Astrovirus NOT DETECTED NOT DETECTED Final   Norovirus GI/GII NOT DETECTED NOT DETECTED Final   Rotavirus A NOT DETECTED NOT DETECTED Final   Sapovirus (I, II, IV, and V) NOT DETECTED NOT DETECTED Final    Comment: Performed at [facility], [address] [place], [place] [address]  STOOL CULTURE REFLEX - RSASHR     Status: None   Collection Time: 05/16/22 12:10 PM  Result Value Ref Range Status   Stool Culture result 1 (RSASHR) Comment  Final    Comment: (NOTE) No Salmonella or Shigella recovered. Performed At: [facility] [address] [place], [place] [id number] Rush Farmer MD XN:[id number]   STOOL CULTURE Reflex - CMPCXR     Status: None   Collection Time: 05/16/22 12:10 PM  Result Value Ref Range Status   Stool Culture result 1 (CMPCXR) Comment  Final    Comment: (NOTE) No Campylobacter species isolated. Performed At: [facility] [place], [place] [id number] Rush Farmer MD GY:[id number]      Radiology Studies: DG CHEST PORT 1 VIEW  Result Date: 05/25/2022 CLINICAL DATA:  Chest pain EXAM: PORTABLE CHEST 1 VIEW COMPARISON:  05/22/2022 FINDINGS: Right internal jugular approach hemodialysis catheter terminates at the level of the right atrium. Stable heart size. Small right pleural effusion with associated right basilar opacity, similar to prior. Similar prominent interstitial markings bilaterally. No pneumothorax. IMPRESSION: 1. Small right pleural effusion with associated right basilar opacity, similar to prior. 2. Similar prominent interstitial markings bilaterally, likely mild edema. Electronically Signed   By: Davina Poke D.O.   On: 05/25/2022 11:39   DG Abd 1 View  Result Date: 05/25/2022 CLINICAL DATA:  Abdominal pain EXAM: ABDOMEN - 1 VIEW COMPARISON:  05/22/2022 FINDINGS: The bowel gas pattern is normal. No radio-opaque calculi or other significant radiographic abnormality are seen. Surgical clips overlie the pelvis and right upper quadrant. Calcification consistent with fibroid noted. IMPRESSION: Unremarkable bowel gas pattern. Calcified fibroid incidentally noted Electronically Signed   By: Sammie Bench M.D.   On: 05/25/2022 08:22   IR Fluoro Guide CV Line Right  Result Date: 05/24/2022 INDICATION: 72 year old female in need of durable dialysis access. She presents for removal temporary dialysis catheter in placement of a tunneled dialysis catheter. EXAM: TUNNELED CENTRAL VENOUS HEMODIALYSIS CATHETER PLACEMENT WITH ULTRASOUND AND FLUOROSCOPIC  GUIDANCE MEDICATIONS: 2 g Ancef. The antibiotic was given in an appropriate time interval prior to skin puncture. ANESTHESIA/SEDATION: Moderate (conscious) sedation was employed during this procedure. A total of Versed 1 mg and Fentanyl 25 mcg was administered intravenously. Moderate Sedation Time: 11 minutes. The patient's level of consciousness and vital signs were monitored continuously by radiology nursing throughout the procedure under my direct supervision. FLUOROSCOPY TIME:  Radiation exposure index: 1 mGy reference air kerma COMPLICATIONS: None immediate. PROCEDURE: Informed written consent was obtained from the patient after a discussion of the risks, benefits, and alternatives to treatment. Questions regarding the procedure were encouraged and answered. The right neck and chest were prepped with chlorhexidine in a sterile fashion, and a sterile drape was applied covering the operative  field. Maximum barrier sterile technique with sterile gowns and gloves were used for the procedure. A timeout was performed prior to the initiation of the procedure. After creating a small venotomy incision, a micropuncture kit was utilized to access the right internal jugular vein under direct, real-time ultrasound guidance after the overlying soft tissues were anesthetized with 1% lidocaine with epinephrine. Ultrasound image documentation was performed. The microwire was kinked to measure appropriate catheter length. A stiff Glidewire was advanced to the level of the IVC and the micropuncture sheath was exchanged for a peel-away sheath. A palindrome tunneled hemodialysis catheter measuring 19 cm from tip to cuff was tunneled in a retrograde fashion from the anterior chest wall to the venotomy incision. The catheter was then placed through the peel-away sheath with tips ultimately positioned within the superior aspect of the right atrium. Final catheter positioning was confirmed and documented with a spot radiographic image.  The catheter aspirates and flushes normally. The catheter was flushed with appropriate volume heparin dwells. The catheter exit site was secured with a 0-Prolene retention suture. The venotomy incision was closed with an interrupted 4-0 Vicryl, Dermabond and Steri-strips. Dressings were applied. The patient tolerated the procedure well without immediate post procedural complication. IMPRESSION: Successful placement of 19 cm tip to cuff tunneled hemodialysis catheter via the right internal jugular vein with tips terminating within the superior aspect of the right atrium. The catheter is ready for immediate use. Electronically Signed   By: Jacqulynn Cadet M.D.   On: 05/24/2022 12:39   IR US Guide Vasc Access Right  Result Date: 05/24/2022 INDICATION: 72 year old female in need of durable dialysis access. She presents for removal temporary dialysis catheter in placement of a tunneled dialysis catheter. EXAM: TUNNELED CENTRAL VENOUS HEMODIALYSIS CATHETER PLACEMENT WITH ULTRASOUND AND FLUOROSCOPIC GUIDANCE MEDICATIONS: 2 g Ancef. The antibiotic was given in an appropriate time interval prior to skin puncture. ANESTHESIA/SEDATION: Moderate (conscious) sedation was employed during this procedure. A total of Versed 1 mg and Fentanyl 25 mcg was administered intravenously. Moderate Sedation Time: 11 minutes. The patient's level of consciousness and vital signs were monitored continuously by radiology nursing throughout the procedure under my direct supervision. FLUOROSCOPY TIME:  Radiation exposure index: 1 mGy reference air kerma COMPLICATIONS: None immediate. PROCEDURE: Informed written consent was obtained from the patient after a discussion of the risks, benefits, and alternatives to treatment. Questions regarding the procedure were encouraged and answered. The right neck and chest were prepped with chlorhexidine in a sterile fashion, and a sterile drape was applied covering the operative field. Maximum barrier  sterile technique with sterile gowns and gloves were used for the procedure. A timeout was performed prior to the initiation of the procedure. After creating a small venotomy incision, a micropuncture kit was utilized to access the right internal jugular vein under direct, real-time ultrasound guidance after the overlying soft tissues were anesthetized with 1% lidocaine with epinephrine. Ultrasound image documentation was performed. The microwire was kinked to measure appropriate catheter length. A stiff Glidewire was advanced to the level of the IVC and the micropuncture sheath was exchanged for a peel-away sheath. A palindrome tunneled hemodialysis catheter measuring 19 cm from tip to cuff was tunneled in a retrograde fashion from the anterior chest wall to the venotomy incision. The catheter was then placed through the peel-away sheath with tips ultimately positioned within the superior aspect of the right atrium. Final catheter positioning was confirmed and documented with a spot radiographic image. The catheter aspirates and flushes normally. The  catheter was flushed with appropriate volume heparin dwells. The catheter exit site was secured with a 0-Prolene retention suture. The venotomy incision was closed with an interrupted 4-0 Vicryl, Dermabond and Steri-strips. Dressings were applied. The patient tolerated the procedure well without immediate post procedural complication. IMPRESSION: Successful placement of 19 cm tip to cuff tunneled hemodialysis catheter via the right internal jugular vein with tips terminating within the superior aspect of the right atrium. The catheter is ready for immediate use. Electronically Signed   By: Jacqulynn Cadet M.D.   On: 05/24/2022 12:39    Scheduled Meds:  (feeding supplement) PROSource Plus  30 mL Oral TID BM   amLODipine  10 mg Oral Daily   calcium acetate  667 mg Oral TID WC   Chlorhexidine Gluconate Cloth  6 each Topical Daily   Chlorhexidine Gluconate Cloth   6 each Topical Q0600   darbepoetin (ARANESP) injection - DIALYSIS  150 mcg Subcutaneous Q Tue-1800   dextromethorphan-guaiFENesin  1 tablet Oral BID   feeding supplement  1 Container Oral TID BM   hydrALAZINE  50 mg Oral TID   [START ON 05/26/2022] pantoprazole  40 mg Intravenous Q12H   potassium chloride  40 mEq Oral BID   predniSONE  60 mg Oral Q breakfast   sodium bicarbonate  1,300 mg Oral BID   sucralfate  1 g Oral TID WC & HS   Continuous Infusions:  albumin human     pantoprazole 8 mg/hr (05/25/22 1006)   Time Spent: 35 mins    LOS: 9 days   Karron Alvizo M.D on 05/25/2022 at 2:01 PM  Go to [email or web address] - for contact info  [facility] - Office  [phone]  If 7PM-7AM, please contact night-coverage [email or web address] 05/25/2022, 2:01 PM

## 2022-05-25 NOTE — Progress Notes (Signed)
   HEMODIALYSIS TREATMENT NOTE:   3.5 hour heparin-free treatment completed using RIJ TDC.  Dressing was changed; exit site is unremarkable. Goal NOT met.  BP unable to tolerate removal of 2-2.5 liters as ordered.  Pt reported nausea with hypotension.  Compazine given.  Net UF 1 liter.  Darbepoetin 123mg given.  All blood was returned.  Pt was transferred back to 306.  Hand-off given to SToma Copier RN.  Post HD vitals:   05/25/22 1927  Vital Signs  Temp 98.2 F (36.8 C)  Temp Source Oral  Pulse Rate 68  Pulse Rate Source Monitor  Resp 18  BP (!) 130/46  BP Location Right Arm  BP Method Automatic  Patient Position (if appropriate) Lying  Oxygen Therapy  SpO2 97 %  O2 Device Room Air  Dialysis Weight  Weight 51.8 kg  Type of Weight Post-Dialysis  Post Treatment  Dialyzer Clearance Lightly streaked  Duration of HD Treatment -hour(s) 3.5 hour(s)  Hemodialysis Intake (mL) 200 mL  Liters Processed 84.1  Fluid Removed (mL) 1000 mL  Tolerated HD Treatment Yes  Post-Hemodialysis Comments 2L goal was lowered d/t hypotension with nausea.  Net UF 1 liter  Education / Care Plan  Dialysis Education Provided Yes  Documented Education in Care Plan Yes  Outpatient Plan of Care Reviewed and on Chart Yes     ARockwell Alexandria RN AP KDU

## 2022-05-25 NOTE — Progress Notes (Signed)
Patient refused medication . States she feels too sick

## 2022-05-25 NOTE — Progress Notes (Signed)
Patient c/o substernal pain 12 lead EKG done placed in medical chart. Given PRN fentanyl 31mg , CXR ordered for patient

## 2022-05-25 NOTE — Progress Notes (Signed)
Patient arrived to room 306 around 2000. She is alert and oriented x4. Educated patient to unit and call bell. She has no complaints during this shift. She takes her meds crushed in water.

## 2022-05-25 NOTE — Progress Notes (Signed)
Subjective: Patient reports she is feeling terrible. Ongoing epigastric pain. Some nausea earlier. Pain described as stabbing, last BM was yesterday. Reports not doing well on liquid diet though denies worsening of pain or nausea with it. Appetite is minimal.   Objective: Vital signs in last 24 hours: Temp:  [97.5 F (36.4 C)-98.7 F (37.1 C)] 98.7 F (37.1 C) (01/23 0419) Pulse Rate:  [69-90] 81 (01/23 0542) Resp:  [12-21] 18 (01/23 0419) BP: (124-165)/(32-59) 151/51 (01/23 0542) SpO2:  [93 %-100 %] 94 % (01/23 0542) Weight:  [52.8 kg] 52.8 kg (01/23 0500) Last BM Date : 05/24/22 General:   Alert and oriented, pleasant Head:  Normocephalic and atraumatic. Eyes:  No icterus, sclera clear. Conjuctiva pink.  Mouth:  Without lesions, mucosa pink and moist.  Heart:  S1, S2 present, no murmurs noted.  Lungs: Clear to auscultation bilaterally, without wheezing, rales, or rhonchi.  Abdomen:  Bowel sounds present, soft, generalized TTP, worse in LUQ. non-distended. No HSM or hernias noted. No rebound or guarding. No masses appreciated  Msk:  Symmetrical without gross deformities. Normal posture. Pulses:  Normal pulses noted. Extremities:  Without clubbing or edema. Neurologic:  Alert and  oriented x4;  grossly normal neurologically. Skin:  Warm and dry, intact without significant lesions.  Psych:  Alert and cooperative. Normal mood and affect.  Intake/Output from previous day: 01/22 0701 - 01/23 0700 In: 391.1 [P.O.:240; I.V.:151.1] Out: -  Intake/Output this shift: No intake/output data recorded.  Lab Results: Recent Labs    05/23/22 0414 05/24/22 0439 05/25/22 0404  WBC 20.1* 29.7* 20.4*  HGB 8.7* 8.2* 9.1*  HCT 25.4* 24.3* 27.9*  PLT 145* 140* 146*   BMET Recent Labs    05/23/22 0414 05/24/22 0439 05/25/22 0404  NA 135 133* 134*  K 3.6 2.9* 3.5  CL 101 99 100  CO2 '24 24 23  '$ GLUCOSE 108* 78 70  BUN 69* 35* 37*  CREATININE 3.54* 2.54* 3.10*  CALCIUM 7.4* 7.1*  7.2*   LFT Recent Labs    05/23/22 0414 05/24/22 0439 05/25/22 0404  ALBUMIN 2.1* 2.1* 2.1*    Studies/Results: DG Abd 1 View  Result Date: 05/25/2022 CLINICAL DATA:  Abdominal pain EXAM: ABDOMEN - 1 VIEW COMPARISON:  05/22/2022 FINDINGS: The bowel gas pattern is normal. No radio-opaque calculi or other significant radiographic abnormality are seen. Surgical clips overlie the pelvis and right upper quadrant. Calcification consistent with fibroid noted. IMPRESSION: Unremarkable bowel gas pattern. Calcified fibroid incidentally noted Electronically Signed   By: Sammie Bench M.D.   On: 05/25/2022 08:22   IR Fluoro Guide CV Line Right  Result Date: 05/24/2022 INDICATION: 72 year old female in need of durable dialysis access. She presents for removal temporary dialysis catheter in placement of a tunneled dialysis catheter. EXAM: TUNNELED CENTRAL VENOUS HEMODIALYSIS CATHETER PLACEMENT WITH ULTRASOUND AND FLUOROSCOPIC GUIDANCE MEDICATIONS: 2 g Ancef. The antibiotic was given in an appropriate time interval prior to skin puncture. ANESTHESIA/SEDATION: Moderate (conscious) sedation was employed during this procedure. A total of Versed 1 mg and Fentanyl 25 mcg was administered intravenously. Moderate Sedation Time: 11 minutes. The patient's level of consciousness and vital signs were monitored continuously by radiology nursing throughout the procedure under my direct supervision. FLUOROSCOPY TIME:  Radiation exposure index: 1 mGy reference air kerma COMPLICATIONS: None immediate. PROCEDURE: Informed written consent was obtained from the patient after a discussion of the risks, benefits, and alternatives to treatment. Questions regarding the procedure were encouraged and answered. The right neck and chest were prepped  with chlorhexidine in a sterile fashion, and a sterile drape was applied covering the operative field. Maximum barrier sterile technique with sterile gowns and gloves were used for the  procedure. A timeout was performed prior to the initiation of the procedure. After creating a small venotomy incision, a micropuncture kit was utilized to access the right internal jugular vein under direct, real-time ultrasound guidance after the overlying soft tissues were anesthetized with 1% lidocaine with epinephrine. Ultrasound image documentation was performed. The microwire was kinked to measure appropriate catheter length. A stiff Glidewire was advanced to the level of the IVC and the micropuncture sheath was exchanged for a peel-away sheath. A palindrome tunneled hemodialysis catheter measuring 19 cm from tip to cuff was tunneled in a retrograde fashion from the anterior chest wall to the venotomy incision. The catheter was then placed through the peel-away sheath with tips ultimately positioned within the superior aspect of the right atrium. Final catheter positioning was confirmed and documented with a spot radiographic image. The catheter aspirates and flushes normally. The catheter was flushed with appropriate volume heparin dwells. The catheter exit site was secured with a 0-Prolene retention suture. The venotomy incision was closed with an interrupted 4-0 Vicryl, Dermabond and Steri-strips. Dressings were applied. The patient tolerated the procedure well without immediate post procedural complication. IMPRESSION: Successful placement of 19 cm tip to cuff tunneled hemodialysis catheter via the right internal jugular vein with tips terminating within the superior aspect of the right atrium. The catheter is ready for immediate use. Electronically Signed   By: Jacqulynn Cadet M.D.   On: 05/24/2022 12:39   IR US Guide Vasc Access Right  Result Date: 05/24/2022 INDICATION: 72 year old female in need of durable dialysis access. She presents for removal temporary dialysis catheter in placement of a tunneled dialysis catheter. EXAM: TUNNELED CENTRAL VENOUS HEMODIALYSIS CATHETER PLACEMENT WITH ULTRASOUND  AND FLUOROSCOPIC GUIDANCE MEDICATIONS: 2 g Ancef. The antibiotic was given in an appropriate time interval prior to skin puncture. ANESTHESIA/SEDATION: Moderate (conscious) sedation was employed during this procedure. A total of Versed 1 mg and Fentanyl 25 mcg was administered intravenously. Moderate Sedation Time: 11 minutes. The patient's level of consciousness and vital signs were monitored continuously by radiology nursing throughout the procedure under my direct supervision. FLUOROSCOPY TIME:  Radiation exposure index: 1 mGy reference air kerma COMPLICATIONS: None immediate. PROCEDURE: Informed written consent was obtained from the patient after a discussion of the risks, benefits, and alternatives to treatment. Questions regarding the procedure were encouraged and answered. The right neck and chest were prepped with chlorhexidine in a sterile fashion, and a sterile drape was applied covering the operative field. Maximum barrier sterile technique with sterile gowns and gloves were used for the procedure. A timeout was performed prior to the initiation of the procedure. After creating a small venotomy incision, a micropuncture kit was utilized to access the right internal jugular vein under direct, real-time ultrasound guidance after the overlying soft tissues were anesthetized with 1% lidocaine with epinephrine. Ultrasound image documentation was performed. The microwire was kinked to measure appropriate catheter length. A stiff Glidewire was advanced to the level of the IVC and the micropuncture sheath was exchanged for a peel-away sheath. A palindrome tunneled hemodialysis catheter measuring 19 cm from tip to cuff was tunneled in a retrograde fashion from the anterior chest wall to the venotomy incision. The catheter was then placed through the peel-away sheath with tips ultimately positioned within the superior aspect of the right atrium. Final catheter positioning was  confirmed and documented with a spot  radiographic image. The catheter aspirates and flushes normally. The catheter was flushed with appropriate volume heparin dwells. The catheter exit site was secured with a 0-Prolene retention suture. The venotomy incision was closed with an interrupted 4-0 Vicryl, Dermabond and Steri-strips. Dressings were applied. The patient tolerated the procedure well without immediate post procedural complication. IMPRESSION: Successful placement of 19 cm tip to cuff tunneled hemodialysis catheter via the right internal jugular vein with tips terminating within the superior aspect of the right atrium. The catheter is ready for immediate use. Electronically Signed   By: Jacqulynn Cadet M.D.   On: 05/24/2022 12:39    Assessment: Glenola Wheat Gutierres is a 72 year old female with past medical history of COPD, RA, CKD stage 4, admitted to the hospital after presenting pneumonia and AKI on CKD. GI was consulted for evaluation of rectal bleeding and worsening anemia.   Anemia: Colonoscopy 1/20 with melena in TI and colon, followed by EGD with Grade C esophagitis, gastritis, small non-bleeding ulcers in fundus, and multiple duodenal ulcers with 2 of the ulcers having large adherent clots s/p removal. 2 vessels underlying the clots, one actively bleeding s/p bleeding control therapy. Hgb down to 6.3 this admission, s/p total of 3 units PRBCs.  Hgb 9.1 this morning, up from 8.2 yesterday. Reporting epigastric pain, intermittent nausea, states pain began today. Last BM yesterday with some melena still, no BRBPR    Plan: Repeat EGD in 2 months Continue carafate 1g QID Avoid nsaids Protonix drip to end this afternoon Continue PPI BID  Trend h&h, monitor for overt GI bleeding    LOS: 9 days    05/25/2022, 9:22 AM  Messi Twedt L. Alver Sorrow, MSN, APRN, AGNP-C Adult-Gerontology Nurse Practitioner [facility]

## 2022-05-25 NOTE — Progress Notes (Signed)
Patient refused boost breeze and pro- source . Will notify Dr. Wynetta Emery

## 2022-05-25 NOTE — Progress Notes (Signed)
   05/25/22 1121  Vitals  Temp 98.4 F (36.9 C)  Temp Source Oral  BP (!) 151/59  MAP (mmHg) 82  BP Method Automatic  Pulse Rate 81  Resp 18  Level of Consciousness  Level of Consciousness Alert  Oxygen Therapy  SpO2 99 %  O2 Device Room Air

## 2022-05-26 DIAGNOSIS — K922 Gastrointestinal hemorrhage, unspecified: Secondary | ICD-10-CM | POA: Diagnosis not present

## 2022-05-26 DIAGNOSIS — J189 Pneumonia, unspecified organism: Secondary | ICD-10-CM | POA: Diagnosis not present

## 2022-05-26 DIAGNOSIS — R7989 Other specified abnormal findings of blood chemistry: Secondary | ICD-10-CM | POA: Diagnosis not present

## 2022-05-26 LAB — RENAL FUNCTION PANEL
Albumin: 1.9 g/dL — ABNORMAL LOW (ref 3.5–5.0)
Anion gap: 12 (ref 5–15)
BUN: 19 mg/dL (ref 8–23)
CO2: 24 mmol/L (ref 22–32)
Calcium: 7.1 mg/dL — ABNORMAL LOW (ref 8.9–10.3)
Chloride: 99 mmol/L (ref 98–111)
Creatinine, Ser: 2 mg/dL — ABNORMAL HIGH (ref 0.44–1.00)
GFR, Estimated: 26 mL/min — ABNORMAL LOW (ref >60)
Glucose, Bld: 64 mg/dL — ABNORMAL LOW (ref 70–99)
Phosphorus: 2.5 mg/dL (ref 2.5–4.6)
Potassium: 3.6 mmol/L (ref 3.5–5.1)
Sodium: 135 mmol/L (ref 135–145)

## 2022-05-26 LAB — CBC
HCT: 26 % — ABNORMAL LOW (ref 36.0–46.0)
Hemoglobin: 8.4 g/dL — ABNORMAL LOW (ref 12.0–15.0)
MCH: 28.5 pg (ref 26.0–34.0)
MCHC: 32.3 g/dL (ref 30.0–36.0)
MCV: 88.1 fL (ref 80.0–100.0)
Platelets: 165 10*3/uL (ref 150–400)
RBC: 2.95 MIL/uL — ABNORMAL LOW (ref 3.87–5.11)
RDW: 18.4 % — ABNORMAL HIGH (ref 11.5–15.5)
WBC: 28.8 10*3/uL — ABNORMAL HIGH (ref 4.0–10.5)
nRBC: 0 % (ref 0.0–0.2)

## 2022-05-26 LAB — GLUCOSE, CAPILLARY
Glucose-Capillary: 50 mg/dL — ABNORMAL LOW (ref 70–99)
Glucose-Capillary: 95 mg/dL (ref 70–99)
Glucose-Capillary: 107 mg/dL — ABNORMAL HIGH (ref 70–99)

## 2022-05-26 NOTE — Progress Notes (Signed)
Patient BS was '50mg'$ /dl . Given orange juice 8 oz. Patient is alert, notified Dr. Carles Collet, will recheck CBG

## 2022-05-26 NOTE — Progress Notes (Signed)
Patient ID: Sheri Cooper, female   DOB: 02/25/1951, 72 y.o.   MRN: [id number] S: Complaining of abdominal pain this morning. O:BP (!) 148/52   Pulse (!) 101   Temp 98.7 F (37.1 C)   Resp 18   Ht '4\' 11"'$  (1.499 m)   Wt 51.8 kg   SpO2 95%   BMI 23.07 kg/m   Intake/Output Summary (Last 24 hours) at 05/26/2022 1018 Last data filed at 05/26/2022 0646 Gross per 24 hour  Intake 240 ml  Output 1000 ml  Net -760 ml   Intake/Output: I/O last 3 completed shifts: In: 268.3 [P.O.:240; I.V.:28.3] Out: 1000 [Other:1000]  Intake/Output this shift:  No intake/output data recorded. Weight change: 0.1 kg Gen: NAD CVS: tachy Resp: CTA Abd: +BS, soft, + diffuse tenderness to palpation, no guarding or rebound Ext: 1+ pretibial edema bilaterally  Recent Labs  Lab 05/20/22 0501 05/21/22 0456 05/22/22 0346 05/22/22 1440 05/23/22 0414 05/24/22 0439 05/25/22 0404 05/26/22 0410  NA 138 135 135  --  135 133* 134* 135  K 3.2* 3.0* 2.6* 3.0* 3.6 2.9* 3.5 3.6  CL 106 101 99  --  101 99 100 99  CO2 15* 22 25  --  '24 24 23 24  '$ GLUCOSE 141* 120* 96  --  108* 78 70 64*  BUN 113* 78* 47*  --  69* 35* 37* 19  CREATININE 5.92* 4.03* 2.73*  --  3.54* 2.54* 3.10* 2.00*  ALBUMIN 2.1* 1.9* 2.1*  --  2.1* 2.1* 2.1* 1.9*  CALCIUM 6.8* 6.8* 7.2*  --  7.4* 7.1* 7.2* 7.1*  PHOS 8.8* 5.2* 3.6  --  4.5 2.8 3.5 2.5   Liver Function Tests: Recent Labs  Lab 05/24/22 0439 05/25/22 0404 05/26/22 0410  ALBUMIN 2.1* 2.1* 1.9*   Recent Labs  Lab 05/25/22 0404  LIPASE 41   No results for input(s): "AMMONIA" in the last 168 hours. CBC: Recent Labs  Lab 05/22/22 0346 05/23/22 0414 05/24/22 0439 05/25/22 0404 05/26/22 0410  WBC 24.0* 20.1* 29.7* 20.4* 28.8*  HGB 9.8* 8.7* 8.2* 9.1* 8.4*  HCT 27.9* 25.4* 24.3* 27.9* 26.0*  MCV 81.6 82.7 85.9 86.9 88.1  PLT 134* 145* 140* 146* 165   Cardiac Enzymes: No results for input(s): "CKTOTAL", "CKMB", "CKMBINDEX", "TROPONINI" in the last 168  hours. CBG: Recent Labs  Lab 05/26/22 0734 05/26/22 0819  GLUCAP 50* 95    Iron Studies: No results for input(s): "IRON", "TIBC", "TRANSFERRIN", "FERRITIN" in the last 72 hours. Studies/Results: DG CHEST PORT 1 VIEW  Result Date: 05/25/2022 CLINICAL DATA:  Chest pain EXAM: PORTABLE CHEST 1 VIEW COMPARISON:  05/22/2022 FINDINGS: Right internal jugular approach hemodialysis catheter terminates at the level of the right atrium. Stable heart size. Small right pleural effusion with associated right basilar opacity, similar to prior. Similar prominent interstitial markings bilaterally. No pneumothorax. IMPRESSION: 1. Small right pleural effusion with associated right basilar opacity, similar to prior. 2. Similar prominent interstitial markings bilaterally, likely mild edema. Electronically Signed   By: Davina Poke D.O.   On: 05/25/2022 11:39   DG Abd 1 View  Result Date: 05/25/2022 CLINICAL DATA:  Abdominal pain EXAM: ABDOMEN - 1 VIEW COMPARISON:  05/22/2022 FINDINGS: The bowel gas pattern is normal. No radio-opaque calculi or other significant radiographic abnormality are seen. Surgical clips overlie the pelvis and right upper quadrant. Calcification consistent with fibroid noted. IMPRESSION: Unremarkable bowel gas pattern. Calcified fibroid incidentally noted Electronically Signed   By: Sammie Bench M.D.   On: 05/25/2022  08:22    (feeding supplement) PROSource Plus  30 mL Oral TID BM   amLODipine  10 mg Oral Daily   calcium acetate  667 mg Oral TID WC   Chlorhexidine Gluconate Cloth  6 each Topical Daily   Chlorhexidine Gluconate Cloth  6 each Topical Q0600   darbepoetin (ARANESP) injection - DIALYSIS  150 mcg Subcutaneous Q Tue-1800   dextromethorphan-guaiFENesin  1 tablet Oral BID   feeding supplement  1 Container Oral TID BM   hydrALAZINE  50 mg Oral TID   pantoprazole  40 mg Intravenous Q12H   potassium chloride  40 mEq Oral BID   predniSONE  60 mg Oral Q breakfast   sodium  bicarbonate  1,300 mg Oral BID   sucralfate  1 g Oral TID WC & HS    BMET    Component Value Date/Time   NA 135 05/26/2022 0410   K 3.6 05/26/2022 0410   CL 99 05/26/2022 0410   CO2 24 05/26/2022 0410   GLUCOSE 64 (L) 05/26/2022 0410   BUN 19 05/26/2022 0410   CREATININE 2.00 (H) 05/26/2022 0410   CALCIUM 7.1 (L) 05/26/2022 0410   GFRNONAA 26 (L) 05/26/2022 0410   CBC    Component Value Date/Time   WBC 28.8 (H) 05/26/2022 0410   RBC 2.95 (L) 05/26/2022 0410   HGB 8.4 (L) 05/26/2022 0410   HCT 26.0 (L) 05/26/2022 0410   PLT 165 05/26/2022 0410   MCV 88.1 05/26/2022 0410   MCH 28.5 05/26/2022 0410   MCHC 32.3 05/26/2022 0410   RDW 18.4 (H) 05/26/2022 0410   LYMPHSABS 1.4 05/16/2022 0309   MONOABS 0.6 05/16/2022 0309   EOSABS 0.0 05/16/2022 0309   BASOSABS 0.1 05/16/2022 0309    Assessment/Plan:   AKI/CKD stage IIIb - rapidly progressive.  ANCA, ANA, anti-GBM neg, C3/C4 WNL. HIV, Hep B neg. FLC within acceptable limits. SPEP pending.  She underwent her first HD session on 05/18/22 via temp RIJ HD catheter and started on IV solumedrol x 3.  She underwent US-guided kidney biopsy on 05/19/22.  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 Plan: stay the course with prednisone 60 mg daily for now, will need prolonged taper course. EM pending.   S/P TDC placement by IR 05/24/22 at [facility].  Will continue with IHD on TTS schedule for now.  She has a chair at [facility] TTS pm shift.  Will need to be there at 10:30 am on 06/01/22.  Avoid nephrotoxic medications including NSAIDs and iodinated intravenous contrast exposure unless the latter is absolutely indicated.   Preferred narcotic agents for pain control are hydromorphone, fentanyl, and methadone. Morphine should not be used.  Avoid Baclofen and avoid oral sodium phosphate and magnesium citrate based laxatives / bowel preps.  Continue strict Input and Output monitoring. Will monitor the patient closely with you and  intervene or adjust therapy as indicated by changes in clinical status/labs   HCAP and COPD exacerbation - abx per primary. Anemia of CKD stage V - started on ESA and transfuse prn.  FOBT +, GI consulted.  EGD with grade C esophagitis and gastritis with small nonbleeding ulcers in fundus.  2 ulcers in the duodenum with large adherent clots with underlying bleeding vessels treated with epi and coagulation.  One vessel treated also with a clip, however second vessel could not be clipped. Currently on PPI.  Hgb dropped from 9.1 to 8.4 today.  Continue with ESA and transfuse prn.  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  No heparin. Hypokalemia - replete orally and follow.  Will use 4K bath with HD tomorrow.  Disposition - has outpatient spot on 06/01/22 at [facility].  Donetta Potts, MD [facility]

## 2022-05-26 NOTE — Progress Notes (Signed)
PROGRESS NOTE  Sheri Cooper MBW:[id number] DOB: Feb 25, 1951 DOA: 05/16/2022 PCP: [facility]  Brief History:   72 y.o. female with medical history significant of COPD, rheumatoid arthritis, stage V CKD. Admitted on 05/16/2022 with healthcare associated pneumonia and worsening renal function with anion gap metabolic acidosis.  -Patient was recently discharged from [facility] on 05/12/2022 after treatment for COPD.  Nephrology was consulted.  Her hospitalization was prolonged secondary to HCAP and progressive renal failure.  She finished intravenous cefepime on 05/20/2022.  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  She was started on steroids by nephrology.  Pt was started on hemodialysis on 05/18/22 after temporary HD cath placement and had renal biopsy done on 05/19/22.  On 05/24/2022, tunneled dialysis catheter was placed.  An outpatient dialysis center was ultimately found for the patient.  Her first outpatient dialysis will be scheduled on 06/01/2022. Her hospitalization was prolonged secondary to GI bleed.  She was noted to have melena and hematochezia.  She was transfused 3 units total for the hospitalization.  GI was consulted.  On 05/22/2022, the patient underwent EGD and colonoscopy.  Results are discussed below.  Her hemoglobin stabilized. Assessment/Plan:  HCAP-- TREATED  - recent hospitalization at [facility] as outlined above -COVID, flu and RSV negative -Leukocytosis and elevated procalcitonin noted -finished cefepime on 05/20/22 -completed last dose cefepime 05/20/21 -Bronchodilators and mucolytics as ordered    AKI on CKD 3b -ANCA, ANA, anti-GBM neg, C3/C4 WNL. HIV, Hep B neg. FLC within acceptable limits. SPEP pending  -Anion gap metabolic acidosis noted and sodium bicarbonate -patient sees Dr. Theador Hawthorne as outpatient---discussed with Dr. Lars Masson was concerned that patient would need hemodialysis this admission which has now  been started -Care Everywhere [facility] from [facility] reviewed -appreciate [facility] follow up -first HD 05/18/22 -Right IJ HD catheter placed by Dr. 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   -continue steroids for now   COPD  -- stable  -continue bronchodilators -Pt is on high dose steroids per nephrology for AKI   Leukocytosis  - I think this is a leukemoid reaction from high dose steroids that patient is on - she is afebrile and hemodynamically stable - we are following CBC  Hypokalemia -repleted orally  -recheck in AM   Nausea and epigastric pain -discussed with GI - this is likely coming from the severe ulceration she has in her stomach and duodenum which required sclerotherapy, clipping and ablation  -continue compazine for nausea; held off on giving phenergan and zofran due to prolonged Qtc.   -continue prn oxycodone -received 72 hours protonix infusion to hopefully heal ulcerations   Acute blood loss Anemia / Anemia in CKD -Ferritin and serum iron are not low -Hg down to 6.3, transfused 1 unit PRBC with HD on 1/18, had large bloody BM, Hg down to 6.1 1/19, transfuse 2 units PRBC with HD on 1/19  -recheck Hg improved to 9.8, now trended down to 8.7 after EGD -follow CBC daily; if recurrent GI bleed or signs of hemodynamic instability GI team recommends getting a CT angio bleed protocol STAT and to consult IR if active bleeding identified   Guaiac Positive Stools / GI bleeding Bleeding duodenal ulcers s/p clip x1, epinephrine and coagulation  treatments Esophagitis and Gastritis  -large bloody BM noted by RN guaiac positive on 1/18 -DC'd subcutaneous heparin on 1/18 -transfused 1 unit PRBC with HD on 1/18, Hg down again to 6.1,  transfused 2 unit PRBC 1/19 with HD  -GI team consulted to eval 1/19 -transfused 2 units PRBC with HD 1/19, Hg up to 9.8 -EGD / Colonoscopy planned for 05/22/22 with Dr. Jenetta Downer - with findings of grade C esophagitis and gastritis with ulcers, multiple duodenal ulcers with  adherent blood clots, bleeding vessel clip x 1, epinephrine injection given and APC; however second vessel could not be clipped. Currently on PP  -05/22/21 colonoscopy-blood in entire colon  -continue carafate x 1 month, PPI bid   Thrombocytopenia - resolved now - we stopped [place] heparin on 1/18 - checking HIT antibody labs - pending results - platelets improved to 145 after stopping all heparins  - "pharmacy on HIT alert" order placed   Incidental finding of multiple renal lesions CT  of chest showed multiple renal lesions bilaterally, incompletely characterized on today's noncontrast CT examination, including an indeterminate 1.9 cm lesion in the anterior aspect of the interpolar region of the left kidney.  Follow-up nonemergent outpatient abdominal MRI with and without IV gadolinium is recommended in the near future to definitively evaluate this lesion and exclude neoplasm   Elevated BNP--echo from 05/10/2022 at [facility] available in [place] reveals-  The left ventricle is normal in size with normal wall thickness. The left ventricular systolic function is normal, LVEF is visually estimated at 60-65%.  There is normal left ventricular diastolic function.  -There is mild to moderate pulmonary hypertension no significant valvular abnormalities -hemodialysis to manage volume status   Diarrhea - resolved now;  - c diff testing was negative    Procedures:- -Right IJ HD catheter placed on 05/18/22 -Initiated on hemodialysis on 05/18/2022 -completed IR renal biopsy on 05/19/2022 at [facility] -EGD/colonoscopy completed 05/22/2022    Status is: Inpatient    Disposition: The patient is from: Home              Anticipated  d/c is to: Home --clipped to an outpatient HD center TTS starting 06/01/22 [facility]              Anticipated d/c date is: > 3 days              Patient currently is not medically stable to d/c. Barriers: Not Clinically Stable-   Code Status :  -  Code Status: Full Code    Family Communication:    NA (patient is alert, awake and coherent)    DVT Prophylaxis  :   - SCDs   SCDs Start: 05/16/22 0644           Subjective: Patient denies fevers, chills, headache, chest pain, dyspnea, nausea, vomiting, diarrhea, abdominal pain, dysuria, hematuria, hematochezia, and melena.   Objective: Vitals:   05/25/22 2059 05/26/22 0416 05/26/22 0818 05/26/22 1335  BP: (!) 151/36 (!) 146/87 (!) 148/52 (!) 143/51  Pulse: 82 91 (!) 101 (!) 104  Resp: '18 18  17  '$ Temp: 98.7 F (37.1 C) 98.7 F (37.1 C)    TempSrc:      SpO2: 98% 95%  94%  Weight:      Height:        Intake/Output Summary (Last 24 hours) at 05/26/2022 1703 Last data filed at 05/26/2022 1227 Gross per 24 hour  Intake 720 ml  Output 1000 ml  Net -280 ml  Weight change: 0.1 kg Exam:  General:  Pt is alert, follows commands appropriately, not in acute distress HEENT: No icterus, No thrush, No neck mass, [place]/AT Cardiovascular: RRR, S1/S2, no rubs, no gallops Respiratory: bibasilar crackles.  No wheeze Abdomen: Soft/+BS, epigastric tender, non distended, no guarding Extremities: trace LE edema, No lymphangitis, No petechiae, No rashes, no synovitis   Data Reviewed: I have personally reviewed following labs and imaging studies Basic Metabolic Panel: Recent Labs  Lab 05/20/22 0501 05/21/22 0456 05/22/22 0346 05/22/22 1039 05/22/22 1440 05/23/22 0414 05/24/22 0439 05/25/22 0404 05/26/22 0410  NA 138   < > 135  --   --  135 133* 134* 135  K 3.2*   < > 2.6*  --  3.0* 3.6 2.9* 3.5 3.6  CL 106   < > 99  --   --  101 99 100 99  CO2 15*   < > 25  --   --  '24 24 23 24  '$ GLUCOSE 141*   < > 96  --   --  108* 78 70  64*  BUN 113*   < > 47*  --   --  69* 35* 37* 19  CREATININE 5.92*   < > 2.73*  --   --  3.54* 2.54* 3.10* 2.00*  CALCIUM 6.8*   < > 7.2*  --   --  7.4* 7.1* 7.2* 7.1*  MG 2.2  --   --  1.7  --  2.2 2.0  --   --   PHOS 8.8*   < > 3.6  --   --  4.5 2.8 3.5 2.5   < > = values in this interval not displayed.   Liver Function Tests: Recent Labs  Lab 05/22/22 0346 05/23/22 0414 05/24/22 0439 05/25/22 0404 05/26/22 0410  ALBUMIN 2.1* 2.1* 2.1* 2.1* 1.9*   Recent Labs  Lab 05/25/22 0404  LIPASE 41   No results for input(s): "AMMONIA" in the last 168 hours. Coagulation Profile: No results for input(s): "INR", "PROTIME" in the last 168 hours. CBC: Recent Labs  Lab 05/22/22 0346 05/23/22 0414 05/24/22 0439 05/25/22 0404 05/26/22 0410  WBC 24.0* 20.1* 29.7* 20.4* 28.8*  HGB 9.8* 8.7* 8.2* 9.1* 8.4*  HCT 27.9* 25.4* 24.3* 27.9* 26.0*  MCV 81.6 82.7 85.9 86.9 88.1  PLT 134* 145* 140* 146* 165   Cardiac Enzymes: No results for input(s): "CKTOTAL", "CKMB", "CKMBINDEX", "TROPONINI" in the last 168 hours. BNP: Invalid input(s): "POCBNP" CBG: Recent Labs  Lab 05/26/22 0734 05/26/22 0819 05/26/22 1111  GLUCAP 50* 95 107*   HbA1C: No results for input(s): "HGBA1C" in the last 72 hours. Urine analysis:    Component Value Date/Time   COLORURINE YELLOW 05/17/2022 2340   APPEARANCEUR CLOUDY (A) 05/17/2022 2340   LABSPEC 1.014 05/17/2022 2340   PHURINE 5.0 05/17/2022 2340   GLUCOSEU NEGATIVE 05/17/2022 2340   HGBUR SMALL (A) 05/17/2022 2340   BILIRUBINUR NEGATIVE 05/17/2022 2340   KETONESUR 5 (A) 05/17/2022 2340   PROTEINUR >=300 (A) 05/17/2022 2340   NITRITE NEGATIVE 05/17/2022 2340   LEUKOCYTESUR LARGE (A) 05/17/2022 2340   Sepsis Labs: '@LABRCNTIP'$ (procalcitonin:4,lacticidven:4) )No results found for this or any previous visit (from the past 240 hour(s)).   Scheduled Meds:  (feeding supplement) PROSource Plus  30 mL Oral TID BM   amLODipine  10 mg Oral Daily    calcium acetate  667 mg Oral TID WC   Chlorhexidine Gluconate Cloth  6 each Topical Daily   Chlorhexidine Gluconate Cloth  6 each Topical Q0600   darbepoetin (ARANESP) injection - DIALYSIS  150 mcg Subcutaneous Q Tue-1800   dextromethorphan-guaiFENesin  1 tablet Oral BID   feeding supplement  1 Container Oral TID BM   hydrALAZINE  50 mg Oral TID   pantoprazole  40 mg Intravenous Q12H   potassium chloride  40 mEq Oral BID   predniSONE  60 mg Oral Q breakfast   sodium bicarbonate  1,300 mg Oral BID   sucralfate  1 g Oral TID WC & HS   Continuous Infusions:  albumin human      Procedures/Studies: DG CHEST PORT 1 VIEW  Result Date: 05/25/2022 CLINICAL DATA:  Chest pain EXAM: PORTABLE CHEST 1 VIEW COMPARISON:  05/22/2022 FINDINGS: Right internal jugular approach hemodialysis catheter terminates at the level of the right atrium. Stable heart size. Small right pleural effusion with associated right basilar opacity, similar to prior. Similar prominent interstitial markings bilaterally. No pneumothorax. IMPRESSION: 1. Small right pleural effusion with associated right basilar opacity, similar to prior. 2. Similar prominent interstitial markings bilaterally, likely mild edema. Electronically Signed   By: Davina Poke D.O.   On: 05/25/2022 11:39   DG Abd 1 View  Result Date: 05/25/2022 CLINICAL DATA:  Abdominal pain EXAM: ABDOMEN - 1 VIEW COMPARISON:  05/22/2022 FINDINGS: The bowel gas pattern is normal. No radio-opaque calculi or other significant radiographic abnormality are seen. Surgical clips overlie the pelvis and right upper quadrant. Calcification consistent with fibroid noted. IMPRESSION: Unremarkable bowel gas pattern. Calcified fibroid incidentally noted Electronically Signed   By: Sammie Bench M.D.   On: 05/25/2022 08:22   IR Fluoro Guide CV Line Right  Result Date: 05/24/2022 INDICATION: 72 year old female in need of durable dialysis access. She presents for removal temporary  dialysis catheter in placement of a tunneled dialysis catheter. EXAM: TUNNELED CENTRAL VENOUS HEMODIALYSIS CATHETER PLACEMENT WITH ULTRASOUND AND FLUOROSCOPIC GUIDANCE MEDICATIONS: 2 g Ancef. The antibiotic was given in an appropriate time interval prior to skin puncture. ANESTHESIA/SEDATION: Moderate (conscious) sedation was employed during this procedure. A total of Versed 1 mg and Fentanyl 25 mcg was administered intravenously. Moderate Sedation Time: 11 minutes. The patient's level of consciousness and vital signs were monitored continuously by radiology nursing throughout the procedure under my direct supervision. FLUOROSCOPY TIME:  Radiation exposure index: 1 mGy reference air kerma COMPLICATIONS: None immediate. PROCEDURE: Informed written consent was obtained from the patient after a discussion of the risks, benefits, and alternatives to treatment. Questions regarding the procedure were encouraged and answered. The right neck and chest were prepped with chlorhexidine in a sterile fashion, and a sterile drape was applied covering the operative field. Maximum barrier sterile technique with sterile gowns and gloves were used for the procedure. A timeout was performed prior to the initiation of the procedure. After creating a small venotomy incision, a micropuncture kit was utilized to access the right internal jugular vein under direct, real-time ultrasound guidance after the overlying soft tissues were anesthetized with 1% lidocaine with epinephrine. Ultrasound image documentation was performed. The microwire was kinked to measure appropriate catheter length. A stiff Glidewire was advanced to the level of the IVC and the micropuncture sheath was exchanged for a peel-away sheath. A palindrome tunneled hemodialysis catheter measuring 19 cm from tip to cuff was tunneled in a retrograde fashion from the anterior chest wall to the venotomy incision. The catheter was then placed through the peel-away sheath with  tips ultimately positioned within the superior aspect of the right atrium. Final catheter positioning  was confirmed and documented with a spot radiographic image. The catheter aspirates and flushes normally. The catheter was flushed with appropriate volume heparin dwells. The catheter exit site was secured with a 0-Prolene retention suture. The venotomy incision was closed with an interrupted 4-0 Vicryl, Dermabond and Steri-strips. Dressings were applied. The patient tolerated the procedure well without immediate post procedural complication. IMPRESSION: Successful placement of 19 cm tip to cuff tunneled hemodialysis catheter via the right internal jugular vein with tips terminating within the superior aspect of the right atrium. The catheter is ready for immediate use. Electronically Signed   By: Jacqulynn Cadet M.D.   On: 05/24/2022 12:39   IR US Guide Vasc Access Right  Result Date: 05/24/2022 INDICATION: 72 year old female in need of durable dialysis access. She presents for removal temporary dialysis catheter in placement of a tunneled dialysis catheter. EXAM: TUNNELED CENTRAL VENOUS HEMODIALYSIS CATHETER PLACEMENT WITH ULTRASOUND AND FLUOROSCOPIC GUIDANCE MEDICATIONS: 2 g Ancef. The antibiotic was given in an appropriate time interval prior to skin puncture. ANESTHESIA/SEDATION: Moderate (conscious) sedation was employed during this procedure. A total of Versed 1 mg and Fentanyl 25 mcg was administered intravenously. Moderate Sedation Time: 11 minutes. The patient's level of consciousness and vital signs were monitored continuously by radiology nursing throughout the procedure under my direct supervision. FLUOROSCOPY TIME:  Radiation exposure index: 1 mGy reference air kerma COMPLICATIONS: None immediate. PROCEDURE: Informed written consent was obtained from the patient after a discussion of the risks, benefits, and alternatives to treatment. Questions regarding the procedure were encouraged and  answered. The right neck and chest were prepped with chlorhexidine in a sterile fashion, and a sterile drape was applied covering the operative field. Maximum barrier sterile technique with sterile gowns and gloves were used for the procedure. A timeout was performed prior to the initiation of the procedure. After creating a small venotomy incision, a micropuncture kit was utilized to access the right internal jugular vein under direct, real-time ultrasound guidance after the overlying soft tissues were anesthetized with 1% lidocaine with epinephrine. Ultrasound image documentation was performed. The microwire was kinked to measure appropriate catheter length. A stiff Glidewire was advanced to the level of the IVC and the micropuncture sheath was exchanged for a peel-away sheath. A palindrome tunneled hemodialysis catheter measuring 19 cm from tip to cuff was tunneled in a retrograde fashion from the anterior chest wall to the venotomy incision. The catheter was then placed through the peel-away sheath with tips ultimately positioned within the superior aspect of the right atrium. Final catheter positioning was confirmed and documented with a spot radiographic image. The catheter aspirates and flushes normally. The catheter was flushed with appropriate volume heparin dwells. The catheter exit site was secured with a 0-Prolene retention suture. The venotomy incision was closed with an interrupted 4-0 Vicryl, Dermabond and Steri-strips. Dressings were applied. The patient tolerated the procedure well without immediate post procedural complication. IMPRESSION: Successful placement of 19 cm tip to cuff tunneled hemodialysis catheter via the right internal jugular vein with tips terminating within the superior aspect of the right atrium. The catheter is ready for immediate use. Electronically Signed   By: Jacqulynn Cadet M.D.   On: 05/24/2022 12:39   DG CHEST PORT 1 VIEW  Result Date: 05/22/2022 CLINICAL DATA:   Evaluate for intra-abdominal free air. EXAM: PORTABLE CHEST 1 VIEW COMPARISON:  Abdominal radiograph 05/22/2022 FINDINGS: Central venous catheter tip projects over the superior vena cava. Stable cardiac and mediastinal contours. Small layering right pleural effusion with  underlying consolidation. No pneumothorax. No definite free air no definite gas identified underlying the hemidiaphragms. IMPRESSION: 1. Small layering right pleural effusion with underlying consolidation. 2. No definite free air identified underlying the hemidiaphragms. Consider further evaluation with decubitus imaging of the abdomen if there is persistent clinical concern given the limitations of this portable semi-erect chest radiograph. Electronically Signed   By: Lovey Newcomer M.D.   On: 05/22/2022 16:02   DG Abd 1 View  Result Date: 05/22/2022 CLINICAL DATA:  Postoperative abdominal pain. EXAM: ABDOMEN - 1 VIEW COMPARISON:  None Available. FINDINGS: Gas is demonstrated within nondilated loops of large and small bowel in a nonobstructed pattern. Supine evaluation limited for the detection of free intraperitoneal air. Lumbar spine degenerative changes. Calcific density projecting over the pelvis, nonspecific, potentially calcified uterine fibroid. IMPRESSION: Nonobstructed bowel-gas pattern. Electronically Signed   By: Lovey Newcomer M.D.   On: 05/22/2022 14:08   DG CHEST PORT 1 VIEW  Result Date: 05/21/2022 CLINICAL DATA:  Leukocytosis EXAM: PORTABLE CHEST 1 VIEW COMPARISON:  05/18/2022 FINDINGS: Central venous line tip in distal SVC. Normal cardiac silhouette. Small RIGHT effusion. Mild venous congestion. No pneumothorax. No focal consolidation. Lymphadenectomy clips in the LEFT axilla. No significant interval change. IMPRESSION: 1. No significant interval change. 2. Small RIGHT effusion. 3. Mild venous congestion. Electronically Signed   By: Suzy Bouchard M.D.   On: 05/21/2022 08:22   US BIOPSY (KIDNEY)  Result Date:  05/19/2022 INDICATION: 72 year old female with history of end-stage renal disease. EXAM: ULTRASOUND GUIDED RENAL BIOPSY COMPARISON:  None Available. MEDICATIONS: None. ANESTHESIA/SEDATION: Fentanyl 25 mcg IV; Versed 0.5 mg IV Total Moderate Sedation time: 10 minutes; The patient was continuously monitored during the procedure by the interventional radiology nurse under my direct supervision. COMPLICATIONS: None immediate. PROCEDURE: Informed written consent was obtained from the patient after a discussion of the risks, benefits and alternatives to treatment. The patient understands and consents the procedure. A timeout was performed prior to the initiation of the procedure. Ultrasound scanning was performed of the bilateral flanks. The inferior pole of the left kidney was selected for biopsy due to location and sonographic window. The procedure was planned. The operative site was prepped and draped in the usual sterile fashion. The overlying soft tissues were anesthetized with 1% lidocaine with epinephrine. A 17 gauge coaxial introducer needle was advanced into the inferior cortex of the left kidney and 2 core biopsies were obtained under direct ultrasound guidance with a 16 gauge core biopsy device. Images were saved for documentation purposes. The biopsy device was removed and hemostasis was obtained with manual compression after injection of Gel-Foam slurry along the needle track under ultrasound guidance. Post procedural scanning was negative for significant post procedural hemorrhage or additional complication. A dressing was placed. The patient tolerated the procedure well without immediate post procedural complication. IMPRESSION: Technically successful ultrasound guided left renal biopsy. Ruthann Cancer, MD [facility] Electronically Signed   By: Ruthann Cancer M.D.   On: 05/19/2022 14:19   DG Chest Port 1 View  Result Date: 05/18/2022 CLINICAL  DATA:  Central line placement. EXAM: PORTABLE CHEST 1 VIEW COMPARISON:  CT chest and chest x-ray from 2 days ago. FINDINGS: New right internal jugular central venous catheter with tip at the cavoatrial junction. The heart size and mediastinal contours are within normal limits. Mild diffuse interstitial thickening similar. Decreasing small right and unchanged small left pleural effusions. Increasing asymmetric density in the peripheral left upper lobe. Similar atelectasis  in the peripheral right middle lobe. Improving right lower lobe atelectasis. No pneumothorax. No acute osseous abnormality. IMPRESSION: 1. New right internal jugular central venous catheter with tip at the cavoatrial junction. No pneumothorax. 2. Similar diffuse interstitial thickening with increasing asymmetric density in the peripheral left upper lobe. Differential considerations include infection or pulmonary edema. 3. Decreased small right and unchanged small left pleural effusions. Electronically Signed   By: Titus Dubin M.D.   On: 05/18/2022 14:06   US RENAL  Result Date: 05/18/2022 CLINICAL DATA:  Chronic kidney disease EXAM: RENAL / URINARY TRACT ULTRASOUND COMPLETE COMPARISON:  Abdomen CT report 01/13/2018 FINDINGS: Right Kidney: Renal measurements: 8.9 x 4.7 x 4.4 = volume: 97 mL. Kidney appears echogenic. No collecting system dilatation. There is a simple cyst along the midportion of the kidney which is anechoic with through transmission measuring 17 mm. Left Kidney: Renal measurements: 7.5 x 3.9 x 3.2 = volume: 49 mL. Small echogenic appearing kidney. No collecting system dilatation. Anechoic rounded structure seen in the mid kidney measuring 2 cm. Through transmission is smoothly marginated. Bladder: Appears normal for degree of bladder distention. Other: Scattered ascites.  Probable right-sided pleural effusion. IMPRESSION: Echogenic small appearing kidneys. No collecting system dilatation. Bilateral simple appearing renal  cysts. Ascites.  Right pleural effusion Electronically Signed   By: Jill Side M.D.   On: 05/18/2022 11:07   CT Chest Wo Contrast  Result Date: 05/16/2022 CLINICAL DATA:  72 year old female with history of suspected pneumonia. EXAM: CT CHEST WITHOUT CONTRAST TECHNIQUE: Multidetector CT imaging of the chest was performed following the standard protocol without IV contrast. RADIATION DOSE REDUCTION: This exam was performed according to the departmental dose-optimization program which includes automated exposure control, adjustment of the mA and/or kV according to patient size and/or use of iterative reconstruction technique. COMPARISON:  Chest x-ray 05/16/2022. FINDINGS: Cardiovascular: Heart size is normal. There is no significant pericardial fluid, thickening or pericardial calcification. Aortic atherosclerosis. No definite coronary artery calcifications. Mild calcifications of the aortic valve. Mediastinum/Nodes: No pathologically enlarged mediastinal or hilar lymph nodes. Please note that accurate exclusion of hilar adenopathy is limited on noncontrast CT scans. Esophagus is unremarkable in appearance. No axillary lymphadenopathy. Lungs/Pleura: Moderate to large right and small to moderate left pleural effusions lying dependently associated with areas of passive subsegmental atelectasis in the lower lobes of the lungs bilaterally. In addition, there are mid to upper lung predominant patchy areas of ground-glass attenuation and nodular appearing consolidative airspace disease, likely to reflect multilobar bilateral bronchopneumonia. The largest of these nodular appearing regions in the apex of the left upper lobe (axial image 32 of series 4) measuring 1.3 x 1.2 cm. Upper Abdomen: Aortic atherosclerosis. Tiny partially calcified gallstones lying dependently in the fundus of the gallbladder. Small volume of ascites. Multiple renal lesions bilaterally, incompletely characterized on today's noncontrast CT  examination, most notable for an exophytic intermediate attenuation lesion (25 HU) measuring 1.9 cm in the anterior aspect of the interpolar region of the left kidney. A cluster of nonobstructive calculi is also noted in the posterior aspect of the upper pole collecting system of the right kidney measuring up to 3 mm. Musculoskeletal: There are no aggressive appearing lytic or blastic lesions noted in the visualized portions of the skeleton. IMPRESSION: 1. The appearance the chest is most suggestive of multilobar bilateral bronchopneumonia with bilateral parapneumonic pleural effusions, as above. 2. Given the nodular appearance of some of the areas of apparent airspace consolidation, follow-up noncontrast chest CT is recommended in 3  months to ensure resolution of the nodular areas following treatment for the patient's acute illness, to exclude underlying neoplasm. 3. Multiple renal lesions bilaterally, incompletely characterized on today's noncontrast CT examination, including an indeterminate 1.9 cm lesion in the anterior aspect of the interpolar region of the left kidney. Follow-up nonemergent outpatient abdominal MRI with and without IV gadolinium is recommended in the near future to definitively evaluate this lesion and exclude neoplasm. 4. Cholelithiasis. 5. Nonobstructive calculi in the right renal collecting system measuring up to 3 mm. 6. Ascites. 7. Aortic atherosclerosis. Aortic Atherosclerosis (ICD10-I70.0). Electronically Signed   By: Vinnie Langton M.D.   On: 05/16/2022 05:23   DG Chest Port 1 View  Result Date: 05/16/2022 CLINICAL DATA:  Shortness of breath EXAM: PORTABLE CHEST 1 VIEW COMPARISON:  05/07/2022 FINDINGS: Cardiac shadow is enlarged but stable. Small pleural effusion on the right is again noted. Some increased basilar density is noted on the right when compared with the prior study. Postsurgical changes in the left breast are seen. No bony abnormality is noted. IMPRESSION: Persistent  right-sided effusion with slight increase in basilar infiltrate on the right. Electronically Signed   By: Inez Catalina M.D.   On: 05/16/2022 03:45    Orson Eva, DO  [facility]  If 7PM-7AM, please contact night-coverage [email or web address] Password [email or web address] 05/26/2022, 5:03 PM   LOS: 10 days

## 2022-05-26 NOTE — Progress Notes (Signed)
Patient BS rechecked it was 95. Patient eating better this shift. Given PRN oxycodone for c/o epigastric pain. She is able to feed self with tray set up .

## 2022-05-27 ENCOUNTER — Encounter (HOSPITAL_COMMUNITY)
Admission: EM | Disposition: A | Discharge status: Skilled Nursing Facility | Payer: Self-pay | Source: Home / Self Care | Attending: Internal Medicine

## 2022-05-27 ENCOUNTER — Inpatient Hospital Stay (HOSPITAL_COMMUNITY): Payer: Medicare Other

## 2022-05-27 ENCOUNTER — Encounter (HOSPITAL_COMMUNITY): Payer: Self-pay | Admitting: Internal Medicine

## 2022-05-27 DIAGNOSIS — K269 Duodenal ulcer, unspecified as acute or chronic, without hemorrhage or perforation: Secondary | ICD-10-CM | POA: Diagnosis not present

## 2022-05-27 DIAGNOSIS — R7989 Other specified abnormal findings of blood chemistry: Secondary | ICD-10-CM | POA: Diagnosis not present

## 2022-05-27 DIAGNOSIS — J189 Pneumonia, unspecified organism: Principal | ICD-10-CM

## 2022-05-27 DIAGNOSIS — D62 Acute posthemorrhagic anemia: Secondary | ICD-10-CM | POA: Diagnosis not present

## 2022-05-27 DIAGNOSIS — N1832 Chronic kidney disease, stage 3b: Secondary | ICD-10-CM

## 2022-05-27 HISTORY — PX: LAPAROTOMY: SHX154

## 2022-05-27 HISTORY — PX: CENTRAL VENOUS CATHETER INSERTION: SHX401

## 2022-05-27 LAB — HEMOGLOBIN AND HEMATOCRIT, BLOOD
HCT: 13.4 % — ABNORMAL LOW (ref 36.0–46.0)
Hemoglobin: 4.3 g/dL — CL (ref 12.0–15.0)

## 2022-05-27 LAB — PREPARE RBC (CROSSMATCH)

## 2022-05-27 LAB — RENAL FUNCTION PANEL
Albumin: 1.9 g/dL — ABNORMAL LOW (ref 3.5–5.0)
Anion gap: 11 (ref 5–15)
BUN: 33 mg/dL — ABNORMAL HIGH (ref 8–23)
CO2: 26 mmol/L (ref 22–32)
Calcium: 7.4 mg/dL — ABNORMAL LOW (ref 8.9–10.3)
Chloride: 99 mmol/L (ref 98–111)
Creatinine, Ser: 2.98 mg/dL — ABNORMAL HIGH (ref 0.44–1.00)
GFR, Estimated: 16 mL/min — ABNORMAL LOW (ref >60)
Glucose, Bld: 94 mg/dL (ref 70–99)
Phosphorus: 2.6 mg/dL (ref 2.5–4.6)
Potassium: 4.5 mmol/L (ref 3.5–5.1)
Sodium: 136 mmol/L (ref 135–145)

## 2022-05-27 LAB — CBC
HCT: 24.6 % — ABNORMAL LOW (ref 36.0–46.0)
Hemoglobin: 8 g/dL — ABNORMAL LOW (ref 12.0–15.0)
MCH: 28.2 pg (ref 26.0–34.0)
MCHC: 32.5 g/dL (ref 30.0–36.0)
MCV: 86.6 fL (ref 80.0–100.0)
Platelets: 203 10*3/uL (ref 150–400)
RBC: 2.84 MIL/uL — ABNORMAL LOW (ref 3.87–5.11)
RDW: 18 % — ABNORMAL HIGH (ref 11.5–15.5)
WBC: 24.3 10*3/uL — ABNORMAL HIGH (ref 4.0–10.5)
nRBC: 0 % (ref 0.0–0.2)

## 2022-05-27 SURGERY — LAPAROTOMY, EXPLORATORY
Anesthesia: General | Site: Neck

## 2022-05-27 MED ORDER — DEXAMETHASONE SODIUM PHOSPHATE 10 MG/ML IJ SOLN
INTRAMUSCULAR | Status: AC
  Filled 2022-05-27: qty 1

## 2022-05-27 MED ORDER — SODIUM CHLORIDE 0.9% IV SOLUTION: Freq: Once | INTRAVENOUS | Status: AC

## 2022-05-27 MED ORDER — FENTANYL CITRATE (PF) 250 MCG/5ML IJ SOLN
INTRAMUSCULAR | Status: AC
  Filled 2022-05-27: qty 5

## 2022-05-27 MED ORDER — SUCCINYLCHOLINE CHLORIDE 200 MG/10ML IV SOSY
PREFILLED_SYRINGE | INTRAVENOUS | Status: AC
  Filled 2022-05-27: qty 10

## 2022-05-27 MED ORDER — ALBUMIN HUMAN 25 % IV SOLN: 25.0000 g | Freq: Once | INTRAVENOUS | Status: AC

## 2022-05-27 MED ORDER — PHENYLEPHRINE 80 MCG/ML (10ML) SYRINGE FOR IV PUSH (FOR BLOOD PRESSURE SUPPORT)
PREFILLED_SYRINGE | INTRAVENOUS | Status: AC
  Filled 2022-05-27: qty 10

## 2022-05-27 MED ORDER — BUPIVACAINE LIPOSOME 1.3 % IJ SUSP
INTRAMUSCULAR | Status: AC
  Filled 2022-05-27: qty 20

## 2022-05-27 MED ORDER — PHENYLEPHRINE HCL-NACL 20-0.9 MG/250ML-% IV SOLN
INTRAVENOUS | Status: AC
  Filled 2022-05-27: qty 250

## 2022-05-27 MED ORDER — ROCURONIUM BROMIDE 10 MG/ML (PF) SYRINGE
PREFILLED_SYRINGE | INTRAVENOUS | Status: AC
  Filled 2022-05-27: qty 10

## 2022-05-27 MED ORDER — IOHEXOL 350 MG/ML SOLN
75.0000 mL | Freq: Once | INTRAVENOUS | Status: AC | PRN
  Administered 2022-05-27: 75 mL via INTRAVENOUS

## 2022-05-27 MED ORDER — HYDRALAZINE HCL 25 MG PO TABS: 50.0000 mg | ORAL_TABLET | Freq: Two times a day (BID) | ORAL | Status: DC

## 2022-05-27 MED ORDER — LIDOCAINE HCL (PF) 2 % IJ SOLN
INTRAMUSCULAR | Status: AC
  Filled 2022-05-27: qty 5

## 2022-05-27 MED ORDER — HEPARIN SODIUM (PORCINE) 1000 UNIT/ML IJ SOLN
INTRAMUSCULAR | Status: AC
  Administered 2022-05-27: 3200 [IU]
  Filled 2022-05-27: qty 5

## 2022-05-27 MED ORDER — PROPOFOL 10 MG/ML IV BOLUS
INTRAVENOUS | Status: AC
  Filled 2022-05-27: qty 20

## 2022-05-27 MED ORDER — MIDAZOLAM HCL 2 MG/2ML IJ SOLN
INTRAMUSCULAR | Status: AC
  Filled 2022-05-27: qty 2

## 2022-05-27 MED ORDER — DARBEPOETIN ALFA 200 MCG/0.4ML IJ SOSY
200.0000 ug | PREFILLED_SYRINGE | INTRAMUSCULAR | Status: DC
  Administered 2022-06-01: 200 ug via SUBCUTANEOUS
  Filled 2022-05-27: qty 0.4

## 2022-05-27 MED ORDER — ALBUMIN HUMAN 25 % IV SOLN
INTRAVENOUS | Status: AC
  Administered 2022-05-27: 25 g via INTRAVENOUS
  Filled 2022-05-27: qty 100

## 2022-05-27 SURGICAL SUPPLY — 40 items
BIOPATCH WHT 1IN DISK W/4.0 H (GAUZE/BANDAGES/DRESSINGS) IMPLANT
CHLORAPREP W/TINT 26 (MISCELLANEOUS) ×2 IMPLANT
CLOTH BEACON ORANGE TIMEOUT ST (SAFETY) ×2 IMPLANT
COVER LIGHT HANDLE STERIS (MISCELLANEOUS) ×4 IMPLANT
DRAPE WARM FLUID 44X44 (DRAPES) ×2 IMPLANT
DRSG OPSITE POSTOP 4X8 (GAUZE/BANDAGES/DRESSINGS) IMPLANT
ELECT REM PT RETURN 9FT ADLT (ELECTROSURGICAL) ×2
ELECTRODE REM PT RTRN 9FT ADLT (ELECTROSURGICAL) ×2 IMPLANT
EVACUATOR DRAINAGE 10X20 100CC (DRAIN) IMPLANT
EVACUATOR SILICONE 100CC (DRAIN) ×2
GAUZE 4X4 16PLY ~~LOC~~+RFID DBL (SPONGE) IMPLANT
GAUZE SPONGE 4X4 12PLY STRL (GAUZE/BANDAGES/DRESSINGS) IMPLANT
GLOVE BIO SURGEON STRL SZ 6.5 (GLOVE) ×2 IMPLANT
GLOVE BIOGEL PI IND STRL 6.5 (GLOVE) ×2 IMPLANT
GLOVE BIOGEL PI IND STRL 7.0 (GLOVE) ×4 IMPLANT
GLOVE ECLIPSE 6.5 STRL STRAW (GLOVE) IMPLANT
GOWN STRL REUS W/TWL LRG LVL3 (GOWN DISPOSABLE) ×6 IMPLANT
HANDLE SUCTION POOLE (INSTRUMENTS) ×2 IMPLANT
INST SET MAJOR GENERAL (KITS) ×2 IMPLANT
KIT TURNOVER KIT A (KITS) ×2 IMPLANT
MANIFOLD NEPTUNE II (INSTRUMENTS) ×2 IMPLANT
NDL HYPO 21X1.5 SAFETY (NEEDLE) ×2 IMPLANT
NEEDLE HYPO 21X1.5 SAFETY (NEEDLE) ×2 IMPLANT
NS IRRIG 1000ML POUR BTL (IV SOLUTION) ×4 IMPLANT
PACK MAJOR ABDOMINAL (CUSTOM PROCEDURE TRAY) ×2 IMPLANT
PAD ARMBOARD 7.5X6 YLW CONV (MISCELLANEOUS) ×2 IMPLANT
PENCIL SMOKE EVACUATOR COATED (MISCELLANEOUS) ×2 IMPLANT
RETRACTOR WOUND ALXS 18CM MED (MISCELLANEOUS) IMPLANT
RTRCTR WOUND ALEXIS O 18CM MED (MISCELLANEOUS) ×2
SET BASIN LINEN APH (SET/KITS/TRAYS/PACK) ×2 IMPLANT
SPONGE DRAIN TRACH 4X4 STRL 2S (GAUZE/BANDAGES/DRESSINGS) IMPLANT
SPONGE T-LAP 18X18 ~~LOC~~+RFID (SPONGE) ×2 IMPLANT
STAPLER VISISTAT (STAPLE) ×2 IMPLANT
SUCTION POOLE HANDLE (INSTRUMENTS) ×2
SUT ETHILON 3 0 FSL (SUTURE) IMPLANT
SUT PDS AB CT VIOLET #0 27IN (SUTURE) ×4 IMPLANT
SUT SILK 3 0 SH CR/8 (SUTURE) ×2 IMPLANT
SWAB CULTURE ESWAB REG 1ML (MISCELLANEOUS) IMPLANT
SYR 20ML LL LF (SYRINGE) ×4 IMPLANT
TRAY FOLEY MTR SLVR 16FR STAT (SET/KITS/TRAYS/PACK) ×2 IMPLANT

## 2022-05-27 NOTE — Progress Notes (Signed)
Patient ID: Sheri Cooper, female   DOB: April 13, 1951, 72 y.o.   MRN: [id number]  S: one unmeasured urinary occurrence !  Due for HD today -  BUN and crt rising off of HD -  no belly pain-  thinks she is making more urine-  not getting out of bed much , not much appetite   O:BP (!) 153/46 (BP Location: Left Arm)   Pulse 92   Temp 98.8 F (37.1 C)   Resp 18   Ht '4\' 11"'$  (1.499 m)   Wt 54.3 kg   SpO2 93%   BMI 24.18 kg/m   Intake/Output Summary (Last 24 hours) at 05/27/2022 0801 Last data filed at 05/27/2022 0615 Gross per 24 hour  Intake 720 ml  Output --  Net 720 ml   Intake/Output: I/O last 3 completed shifts: In: 960 [P.O.:960] Out: 1000 [Other:1000]  Intake/Output this shift:  No intake/output data recorded. Weight change: 1.4 kg Gen: NAD CVS: tachy Resp: CTA Abd: +BS, soft, + diffuse tenderness to palpation, no guarding or rebound Ext: 1+ pretibial edema bilaterally  Recent Labs  Lab 05/21/22 0456 05/22/22 0346 05/22/22 1440 05/23/22 0414 05/24/22 0439 05/25/22 0404 05/26/22 0410 05/27/22 0420  NA 135 135  --  135 133* 134* 135 136  K 3.0* 2.6* 3.0* 3.6 2.9* 3.5 3.6 4.5  CL 101 99  --  101 99 100 99 99  CO2 22 25  --  '24 24 23 24 26  '$ GLUCOSE 120* 96  --  108* 78 70 64* 94  BUN 78* 47*  --  69* 35* 37* 19 33*  CREATININE 4.03* 2.73*  --  3.54* 2.54* 3.10* 2.00* 2.98*  ALBUMIN 1.9* 2.1*  --  2.1* 2.1* 2.1* 1.9* 1.9*  CALCIUM 6.8* 7.2*  --  7.4* 7.1* 7.2* 7.1* 7.4*  PHOS 5.2* 3.6  --  4.5 2.8 3.5 2.5 2.6   Liver Function Tests: Recent Labs  Lab 05/25/22 0404 05/26/22 0410 05/27/22 0420  ALBUMIN 2.1* 1.9* 1.9*   Recent Labs  Lab 05/25/22 0404  LIPASE 41   No results for input(s): "AMMONIA" in the last 168 hours. CBC: Recent Labs  Lab 05/23/22 0414 05/24/22 0439 05/25/22 0404 05/26/22 0410 05/27/22 0420  WBC 20.1* 29.7* 20.4* 28.8* 24.3*  HGB 8.7* 8.2* 9.1* 8.4* 8.0*  HCT 25.4* 24.3* 27.9* 26.0* 24.6*  MCV 82.7 85.9 86.9 88.1 86.6  PLT  145* 140* 146* 165 203   Cardiac Enzymes: No results for input(s): "CKTOTAL", "CKMB", "CKMBINDEX", "TROPONINI" in the last 168 hours. CBG: Recent Labs  Lab 05/26/22 0734 05/26/22 0819 05/26/22 1111  GLUCAP 50* 95 107*    Iron Studies: No results for input(s): "IRON", "TIBC", "TRANSFERRIN", "FERRITIN" in the last 72 hours. Studies/Results: DG CHEST PORT 1 VIEW  Result Date: 05/25/2022 CLINICAL DATA:  Chest pain EXAM: PORTABLE CHEST 1 VIEW COMPARISON:  05/22/2022 FINDINGS: Right internal jugular approach hemodialysis catheter terminates at the level of the right atrium. Stable heart size. Small right pleural effusion with associated right basilar opacity, similar to prior. Similar prominent interstitial markings bilaterally. No pneumothorax. IMPRESSION: 1. Small right pleural effusion with associated right basilar opacity, similar to prior. 2. Similar prominent interstitial markings bilaterally, likely mild edema. Electronically Signed   By: Davina Poke D.O.   On: 05/25/2022 11:39    (feeding supplement) PROSource Plus  30 mL Oral TID BM   amLODipine  10 mg Oral Daily   calcium acetate  667 mg Oral TID WC  Chlorhexidine Gluconate Cloth  6 each Topical Daily   Chlorhexidine Gluconate Cloth  6 each Topical Q0600   darbepoetin (ARANESP) injection - DIALYSIS  150 mcg Subcutaneous Q Tue-1800   dextromethorphan-guaiFENesin  1 tablet Oral BID   feeding supplement  1 Container Oral TID BM   hydrALAZINE  50 mg Oral TID   pantoprazole  40 mg Intravenous Q12H   potassium chloride  40 mEq Oral BID   predniSONE  60 mg Oral Q breakfast   sodium bicarbonate  1,300 mg Oral BID   sucralfate  1 g Oral TID WC & HS    BMET    Component Value Date/Time   NA 136 05/27/2022 0420   K 4.5 05/27/2022 0420   CL 99 05/27/2022 0420   CO2 26 05/27/2022 0420   GLUCOSE 94 05/27/2022 0420   BUN 33 (H) 05/27/2022 0420   CREATININE 2.98 (H) 05/27/2022 0420   CALCIUM 7.4 (L) 05/27/2022 0420   GFRNONAA  16 (L) 05/27/2022 0420   CBC    Component Value Date/Time   WBC 24.3 (H) 05/27/2022 0420   RBC 2.84 (L) 05/27/2022 0420   HGB 8.0 (L) 05/27/2022 0420   HCT 24.6 (L) 05/27/2022 0420   PLT 203 05/27/2022 0420   MCV 86.6 05/27/2022 0420   MCH 28.2 05/27/2022 0420   MCHC 32.5 05/27/2022 0420   RDW 18.0 (H) 05/27/2022 0420   LYMPHSABS 1.4 05/16/2022 0309   MONOABS 0.6 05/16/2022 0309   EOSABS 0.0 05/16/2022 0309   BASOSABS 0.1 05/16/2022 0309    Assessment/Plan:   AKI/CKD stage IIIb - rapidly progressive.  ANCA, ANA, anti-GBM neg, C3/C4 WNL. HIV, Hep B neg. FLC and SPEP neg.  She had  first HD session on 05/18/22 via temp RIJ HD catheter and started on IV solumedrol x 3.   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 Plan: stay the course with prednisone 60 mg daily for now, will need prolonged taper.   S/P TDC placement by IR 05/24/22 at [facility].  continue with IHD on TTS schedule for now.  She has a chair at [facility] TTS pm shift.  Will need to be there at 10:30 am on 06/01/22.    Continue strict Input and Output monitoring. Will monitor the patient closely with you and intervene or adjust therapy as indicated by changes in clinical status/labs.  Is now making some urine when was making none-  BUN and crt climbing off of HD but pre HD crt trending down  ?    HCAP and COPD exacerbation - abx per primary. Anemia of CKD stage V - started on ESA and transfuse prn.  FOBT +, GI consulted.  EGD with grade C esophagitis and gastritis with small nonbleeding ulcers in fundus.  2 ulcers in the duodenum with large adherent clots with underlying bleeding vessels treated with epi and coagulation.  One vessel treated also with a clip, however second vessel could not be clipped.  on PPI.  Hgb trending down.  Continue with ESA (darbe 200 weekly)and transfuse prn. 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   No heparin. Hypokalemia - replete orally and follow.  Will use 4K bath with HD today-  have stopped repletion and sodium bicarb HTN/volume- BP highish-  ok to continue norvasc and hydralazine-  gentle UF  Disposition - has outpatient spot on 06/01/22 at [facility].  Louis Meckel  [employer]

## 2022-05-27 NOTE — Progress Notes (Signed)
Pt had very large, incontinent, bloody stool while in dialysis.  MD was notified. New orders placed.

## 2022-05-27 NOTE — Progress Notes (Signed)
Patient underwent CT angiography due to large hematochezia and acute blood loss anemia (hemoglobin dropped from 8.0-4.3).  CT angiography showed moderate free intraperitoneal gas and fluid in keeping with visceral perforation.  Dr. Jenetta Downer who was already consulted by Dr. Carles Collet called regarding the findings, patient has no active GI bleed, but ongoing visceral perforation.  I spoke with Dr. Constance Haw who will speak to the family and evaluate patient for the management of acute visceral perforation.  I called Dr. Jenetta Downer back to update him regarding discussion with Dr. Constance Haw.  Patient was taken back to ICU after exploratory laparotomy, omental patch and drain placement due to pneumoperitoneum secondary to duodenal perforation. Patient's temperature was in hypothermic range (94.31F) status post surgery, warm blanket was provided with increase in temperature (currently at 96.1 F).  BP is currently at 155/47 (A- line).  Other vital signs are within normal range.  Please refer to Dr. Constance Haw progress note for details regarding the further care of this patient.

## 2022-05-27 NOTE — Progress Notes (Signed)
PROGRESS NOTE  Sheri Cooper UMP:[id number] DOB: 1950-12-13 DOA: 05/16/2022 PCP: [facility]  Brief History:   72 y.o. female with medical history significant of COPD, rheumatoid arthritis, stage V CKD. Admitted on 05/16/2022 with healthcare associated pneumonia and worsening renal function with anion gap metabolic acidosis.  -Patient was recently discharged from [facility] on 05/12/2022 after treatment for COPD.  Nephrology was consulted.  Her hospitalization was prolonged secondary to HCAP and progressive renal failure.  She finished intravenous cefepime on 05/20/2022.  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  She was started on steroids by nephrology.  Pt was started on hemodialysis on 05/18/22 after temporary HD cath placement and had renal biopsy done on 05/19/22.  On 05/24/2022, tunneled dialysis catheter was placed.  An outpatient dialysis center was ultimately found for the patient.  Her first outpatient dialysis will be scheduled on 06/01/2022. Her hospitalization was prolonged secondary to GI bleed.  She was noted to have melena and hematochezia.  She was transfused 3 units total for the hospitalization.  GI was consulted.  On 05/22/2022, the patient underwent EGD and colonoscopy.  Results are discussed below.  Her hemoglobin stabilized.  On the evening of 05/27/22, pt had another large maroon stool.  Pt remains hemodynamically stable.  Case was discussed with GI, Dr. Jenetta Downer.  Cycle Hgb with plans for repeat EGD.  In the setting of AKI, will try to defer CTA for now.  Will need to discuss with nephrology regarding need for CTA abd with possible need for embolization  Assessment/Plan: HCAP-- TREATED  - recent hospitalization at [facility] as outlined above -COVID, flu and RSV negative -Leukocytosis and elevated procalcitonin noted -finished cefepime on 05/20/22 -completed last dose cefepime 05/20/21 -Bronchodilators and mucolytics as  ordered    AKI on CKD 3b -ANCA, ANA, anti-GBM neg, C3/C4 WNL. HIV, Hep B neg. FLC within acceptable limits. SPEP pending  -Anion gap metabolic acidosis noted and sodium bicarbonate -patient sees Dr. Theador Hawthorne as outpatient---discussed with Dr. Lars Masson was concerned that patient would need hemodialysis this admission which has now been started -Care Everywhere [facility] from [facility] reviewed -appreciate [facility] follow up -first HD 05/18/22 -Right IJ HD catheter placed by Dr. 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   -continue steroids for now   COPD  -- stable  -continue bronchodilators -Pt is on high dose steroids per nephrology for AKI   Leukocytosis  - I think this is a leukemoid reaction from high dose steroids that patient is on - she is afebrile and hemodynamically stable - we are following CBC   Hypokalemia -repleted orally  -recheck in AM   Nausea and epigastric pain -discussed with GI - this is likely coming from the severe ulceration she has in her stomach and duodenum which required sclerotherapy, clipping and ablation  -continue compazine for nausea; held off on giving phenergan and zofran due to prolonged Qtc.   -continue prn oxycodone -received 72 hours protonix infusion to hopefully heal ulcerations   Acute blood loss Anemia / Anemia in CKD -Ferritin and serum iron are not low -Hg down to 6.3, transfused 1 unit PRBC with HD on 1/18, had large bloody BM, Hg down to 6.1 1/19, transfuse 2 units PRBC  with HD on 1/19  -recheck Hg improved to 9.8, now trended down to 8.7 after EGD -follow CBC daily; if recurrent GI bleed or signs of hemodynamic instability GI team recommends getting a CT angio bleed protocol STAT and to consult  IR if active bleeding identified   Guaiac Positive Stools / GI bleeding Bleeding duodenal ulcers s/p clip x1, epinephrine and coagulation treatments Esophagitis and Gastritis  -large bloody BM noted by RN guaiac positive on 1/18 -DC'd subcutaneous heparin on 1/18 -transfused 1 unit PRBC with HD on 1/18, Hg down again to 6.1, transfused 2 unit PRBC 1/19 with HD  -GI team consulted to eval 1/19 -transfused 2 units PRBC with HD 1/19, Hg up to 9.8 -EGD / Colonoscopy planned for 05/22/22 with Dr. Jenetta Downer - with findings of grade C esophagitis and gastritis with ulcers, multiple duodenal ulcers with  adherent blood clots, bleeding vessel clip x 1, epinephrine injection given and APC; however second vessel could not be clipped. Currently on PP  -05/22/21 colonoscopy-blood in entire colon  -continue carafate x 1 month, PPI bid -05/27/22--had another large maroon stool   Thrombocytopenia - resolved now - we stopped [place] heparin on 1/18 - checking HIT antibody labs - pending results - platelets improved to 145 after stopping all heparins  - "pharmacy on HIT alert" order placed   Incidental finding of multiple renal lesions CT  of chest showed multiple renal lesions bilaterally, incompletely characterized on today's noncontrast CT examination, including an indeterminate 1.9 cm lesion in the anterior aspect of the interpolar region of the left kidney.  Follow-up nonemergent outpatient abdominal MRI with and without IV gadolinium is recommended in the near future to definitively evaluate this lesion and exclude neoplasm   Elevated BNP--echo from 05/10/2022 at [facility] available in [place] reveals-  The left ventricle is normal in size with normal wall thickness. The left ventricular systolic function is normal, LVEF is visually estimated at 60-65%.  There is normal left ventricular diastolic function.  -There is mild to moderate pulmonary hypertension no significant valvular  abnormalities -hemodialysis to manage volume status   Diarrhea - resolved now;  - c diff testing was negative    Procedures:- -Right IJ HD catheter placed on 05/18/22 -Initiated on hemodialysis on 05/18/2022 -completed IR renal biopsy on 05/19/2022 at [facility] -EGD/colonoscopy completed 05/22/2022    Status is: Inpatient    Disposition: The patient is from: Home              Anticipated d/c is to: Home --clipped to an outpatient HD center TTS starting 06/01/22 [facility]              Anticipated d/c date is: > 3 days              Patient currently is not medically stable to d/c. Barriers: Not Clinically Stable-   Code Status :  -  Code Status: Full Code    Family Communication:    NA (patient is alert, awake and coherent)    DVT Prophylaxis  :   - SCDs   SCDs Start: 05/16/22 0644         Subjective: Patient denies fevers, chills, headache, chest pain, dyspnea, nausea, vomiting, diarrhea, abdominal pain, dysuria, hematuria, hematochezia, and melena.   Objective: Vitals:   05/27/22 1600 05/27/22 1615 05/27/22 1630 05/27/22 1645  BP: (!) 113/41 (!) 95/47 (!) 106/48 (!) 106/42  Pulse:      Resp:  '18 17 16  '$ Temp:  TempSrc:      SpO2:      Weight:      Height:        Intake/Output Summary (Last 24 hours) at 05/27/2022 1654 Last data filed at 05/27/2022 1300 Gross per 24 hour  Intake 480 ml  Output --  Net 480 ml   Weight change: 1.4 kg Exam:  General:  Pt is alert, follows commands appropriately, not in acute distress HEENT: No icterus, No thrush, No neck mass, [place]/AT Cardiovascular: RRR, S1/S2, no rubs, no gallops Respiratory: bibasilar crackles  no wheeze Abdomen: Soft/+BS, non tender, non distended, no guarding Extremities: trace LE edema, No lymphangitis, No petechiae, No rashes, no synovitis   Data Reviewed: I have personally reviewed following labs and imaging studies Basic Metabolic Panel: Recent Labs  Lab 05/22/22 1039 05/22/22 1440  05/23/22 0414 05/24/22 0439 05/25/22 0404 05/26/22 0410 05/27/22 0420  NA  --   --  135 133* 134* 135 136  K  --    < > 3.6 2.9* 3.5 3.6 4.5  CL  --   --  101 99 100 99 99  CO2  --   --  '24 24 23 24 26  '$ GLUCOSE  --   --  108* 78 70 64* 94  BUN  --   --  69* 35* 37* 19 33*  CREATININE  --   --  3.54* 2.54* 3.10* 2.00* 2.98*  CALCIUM  --   --  7.4* 7.1* 7.2* 7.1* 7.4*  MG 1.7  --  2.2 2.0  --   --   --   PHOS  --   --  4.5 2.8 3.5 2.5 2.6   < > = values in this interval not displayed.   Liver Function Tests: Recent Labs  Lab 05/23/22 0414 05/24/22 0439 05/25/22 0404 05/26/22 0410 05/27/22 0420  ALBUMIN 2.1* 2.1* 2.1* 1.9* 1.9*   Recent Labs  Lab 05/25/22 0404  LIPASE 41   No results for input(s): "AMMONIA" in the last 168 hours. Coagulation Profile: No results for input(s): "INR", "PROTIME" in the last 168 hours. CBC: Recent Labs  Lab 05/23/22 0414 05/24/22 0439 05/25/22 0404 05/26/22 0410 05/27/22 0420  WBC 20.1* 29.7* 20.4* 28.8* 24.3*  HGB 8.7* 8.2* 9.1* 8.4* 8.0*  HCT 25.4* 24.3* 27.9* 26.0* 24.6*  MCV 82.7 85.9 86.9 88.1 86.6  PLT 145* 140* 146* 165 203   Cardiac Enzymes: No results for input(s): "CKTOTAL", "CKMB", "CKMBINDEX", "TROPONINI" in the last 168 hours. BNP: Invalid input(s): "POCBNP" CBG: Recent Labs  Lab 05/26/22 0734 05/26/22 0819 05/26/22 1111  GLUCAP 50* 95 107*   HbA1C: No results for input(s): "HGBA1C" in the last 72 hours. Urine analysis:    Component Value Date/Time   COLORURINE YELLOW 05/17/2022 2340   APPEARANCEUR CLOUDY (A) 05/17/2022 2340   LABSPEC 1.014 05/17/2022 2340   PHURINE 5.0 05/17/2022 2340   GLUCOSEU NEGATIVE 05/17/2022 2340   HGBUR SMALL (A) 05/17/2022 2340   BILIRUBINUR NEGATIVE 05/17/2022 2340   KETONESUR 5 (A) 05/17/2022 2340   PROTEINUR >=300 (A) 05/17/2022 2340   NITRITE NEGATIVE 05/17/2022 2340   LEUKOCYTESUR LARGE (A) 05/17/2022 2340   Sepsis Labs: '@LABRCNTIP'$ (procalcitonin:4,lacticidven:4) )No  results found for this or any previous visit (from the past 240 hour(s)).   Scheduled Meds:  (feeding supplement) PROSource Plus  30 mL Oral TID BM   amLODipine  10 mg Oral Daily   calcium acetate  667 mg Oral TID WC   Chlorhexidine Gluconate Cloth  6 each  Topical Daily   Chlorhexidine Gluconate Cloth  6 each Topical Q0600   [START ON 06/01/2022] darbepoetin (ARANESP) injection - DIALYSIS  200 mcg Subcutaneous Q Tue-1800   dextromethorphan-guaiFENesin  1 tablet Oral BID   feeding supplement  1 Container Oral TID BM   [START ON 05/28/2022] hydrALAZINE  50 mg Oral BID   pantoprazole  40 mg Intravenous Q12H   predniSONE  60 mg Oral Q breakfast   sucralfate  1 g Oral TID WC & HS   Continuous Infusions:  albumin human      Procedures/Studies: DG CHEST PORT 1 VIEW  Result Date: 05/25/2022 CLINICAL DATA:  Chest pain EXAM: PORTABLE CHEST 1 VIEW COMPARISON:  05/22/2022 FINDINGS: Right internal jugular approach hemodialysis catheter terminates at the level of the right atrium. Stable heart size. Small right pleural effusion with associated right basilar opacity, similar to prior. Similar prominent interstitial markings bilaterally. No pneumothorax. IMPRESSION: 1. Small right pleural effusion with associated right basilar opacity, similar to prior. 2. Similar prominent interstitial markings bilaterally, likely mild edema. Electronically Signed   By: Davina Poke D.O.   On: 05/25/2022 11:39   DG Abd 1 View  Result Date: 05/25/2022 CLINICAL DATA:  Abdominal pain EXAM: ABDOMEN - 1 VIEW COMPARISON:  05/22/2022 FINDINGS: The bowel gas pattern is normal. No radio-opaque calculi or other significant radiographic abnormality are seen. Surgical clips overlie the pelvis and right upper quadrant. Calcification consistent with fibroid noted. IMPRESSION: Unremarkable bowel gas pattern. Calcified fibroid incidentally noted Electronically Signed   By: Sammie Bench M.D.   On: 05/25/2022 08:22   IR Fluoro  Guide CV Line Right  Result Date: 05/24/2022 INDICATION: 72 year old female in need of durable dialysis access. She presents for removal temporary dialysis catheter in placement of a tunneled dialysis catheter. EXAM: TUNNELED CENTRAL VENOUS HEMODIALYSIS CATHETER PLACEMENT WITH ULTRASOUND AND FLUOROSCOPIC GUIDANCE MEDICATIONS: 2 g Ancef. The antibiotic was given in an appropriate time interval prior to skin puncture. ANESTHESIA/SEDATION: Moderate (conscious) sedation was employed during this procedure. A total of Versed 1 mg and Fentanyl 25 mcg was administered intravenously. Moderate Sedation Time: 11 minutes. The patient's level of consciousness and vital signs were monitored continuously by radiology nursing throughout the procedure under my direct supervision. FLUOROSCOPY TIME:  Radiation exposure index: 1 mGy reference air kerma COMPLICATIONS: None immediate. PROCEDURE: Informed written consent was obtained from the patient after a discussion of the risks, benefits, and alternatives to treatment. Questions regarding the procedure were encouraged and answered. The right neck and chest were prepped with chlorhexidine in a sterile fashion, and a sterile drape was applied covering the operative field. Maximum barrier sterile technique with sterile gowns and gloves were used for the procedure. A timeout was performed prior to the initiation of the procedure. After creating a small venotomy incision, a micropuncture kit was utilized to access the right internal jugular vein under direct, real-time ultrasound guidance after the overlying soft tissues were anesthetized with 1% lidocaine with epinephrine. Ultrasound image documentation was performed. The microwire was kinked to measure appropriate catheter length. A stiff Glidewire was advanced to the level of the IVC and the micropuncture sheath was exchanged for a peel-away sheath. A palindrome tunneled hemodialysis catheter measuring 19 cm from tip to cuff was  tunneled in a retrograde fashion from the anterior chest wall to the venotomy incision. The catheter was then placed through the peel-away sheath with tips ultimately positioned within the superior aspect of the right atrium. Final catheter positioning was confirmed and documented  with a spot radiographic image. The catheter aspirates and flushes normally. The catheter was flushed with appropriate volume heparin dwells. The catheter exit site was secured with a 0-Prolene retention suture. The venotomy incision was closed with an interrupted 4-0 Vicryl, Dermabond and Steri-strips. Dressings were applied. The patient tolerated the procedure well without immediate post procedural complication. IMPRESSION: Successful placement of 19 cm tip to cuff tunneled hemodialysis catheter via the right internal jugular vein with tips terminating within the superior aspect of the right atrium. The catheter is ready for immediate use. Electronically Signed   By: Jacqulynn Cadet M.D.   On: 05/24/2022 12:39   IR US Guide Vasc Access Right  Result Date: 05/24/2022 INDICATION: 72 year old female in need of durable dialysis access. She presents for removal temporary dialysis catheter in placement of a tunneled dialysis catheter. EXAM: TUNNELED CENTRAL VENOUS HEMODIALYSIS CATHETER PLACEMENT WITH ULTRASOUND AND FLUOROSCOPIC GUIDANCE MEDICATIONS: 2 g Ancef. The antibiotic was given in an appropriate time interval prior to skin puncture. ANESTHESIA/SEDATION: Moderate (conscious) sedation was employed during this procedure. A total of Versed 1 mg and Fentanyl 25 mcg was administered intravenously. Moderate Sedation Time: 11 minutes. The patient's level of consciousness and vital signs were monitored continuously by radiology nursing throughout the procedure under my direct supervision. FLUOROSCOPY TIME:  Radiation exposure index: 1 mGy reference air kerma COMPLICATIONS: None immediate. PROCEDURE: Informed written consent was obtained  from the patient after a discussion of the risks, benefits, and alternatives to treatment. Questions regarding the procedure were encouraged and answered. The right neck and chest were prepped with chlorhexidine in a sterile fashion, and a sterile drape was applied covering the operative field. Maximum barrier sterile technique with sterile gowns and gloves were used for the procedure. A timeout was performed prior to the initiation of the procedure. After creating a small venotomy incision, a micropuncture kit was utilized to access the right internal jugular vein under direct, real-time ultrasound guidance after the overlying soft tissues were anesthetized with 1% lidocaine with epinephrine. Ultrasound image documentation was performed. The microwire was kinked to measure appropriate catheter length. A stiff Glidewire was advanced to the level of the IVC and the micropuncture sheath was exchanged for a peel-away sheath. A palindrome tunneled hemodialysis catheter measuring 19 cm from tip to cuff was tunneled in a retrograde fashion from the anterior chest wall to the venotomy incision. The catheter was then placed through the peel-away sheath with tips ultimately positioned within the superior aspect of the right atrium. Final catheter positioning was confirmed and documented with a spot radiographic image. The catheter aspirates and flushes normally. The catheter was flushed with appropriate volume heparin dwells. The catheter exit site was secured with a 0-Prolene retention suture. The venotomy incision was closed with an interrupted 4-0 Vicryl, Dermabond and Steri-strips. Dressings were applied. The patient tolerated the procedure well without immediate post procedural complication. IMPRESSION: Successful placement of 19 cm tip to cuff tunneled hemodialysis catheter via the right internal jugular vein with tips terminating within the superior aspect of the right atrium. The catheter is ready for immediate use.  Electronically Signed   By: Jacqulynn Cadet M.D.   On: 05/24/2022 12:39   DG CHEST PORT 1 VIEW  Result Date: 05/22/2022 CLINICAL DATA:  Evaluate for intra-abdominal free air. EXAM: PORTABLE CHEST 1 VIEW COMPARISON:  Abdominal radiograph 05/22/2022 FINDINGS: Central venous catheter tip projects over the superior vena cava. Stable cardiac and mediastinal contours. Small layering right pleural effusion with underlying consolidation. No pneumothorax.  No definite free air no definite gas identified underlying the hemidiaphragms. IMPRESSION: 1. Small layering right pleural effusion with underlying consolidation. 2. No definite free air identified underlying the hemidiaphragms. Consider further evaluation with decubitus imaging of the abdomen if there is persistent clinical concern given the limitations of this portable semi-erect chest radiograph. Electronically Signed   By: Lovey Newcomer M.D.   On: 05/22/2022 16:02   DG Abd 1 View  Result Date: 05/22/2022 CLINICAL DATA:  Postoperative abdominal pain. EXAM: ABDOMEN - 1 VIEW COMPARISON:  None Available. FINDINGS: Gas is demonstrated within nondilated loops of large and small bowel in a nonobstructed pattern. Supine evaluation limited for the detection of free intraperitoneal air. Lumbar spine degenerative changes. Calcific density projecting over the pelvis, nonspecific, potentially calcified uterine fibroid. IMPRESSION: Nonobstructed bowel-gas pattern. Electronically Signed   By: Lovey Newcomer M.D.   On: 05/22/2022 14:08   DG CHEST PORT 1 VIEW  Result Date: 05/21/2022 CLINICAL DATA:  Leukocytosis EXAM: PORTABLE CHEST 1 VIEW COMPARISON:  05/18/2022 FINDINGS: Central venous line tip in distal SVC. Normal cardiac silhouette. Small RIGHT effusion. Mild venous congestion. No pneumothorax. No focal consolidation. Lymphadenectomy clips in the LEFT axilla. No significant interval change. IMPRESSION: 1. No significant interval change. 2. Small RIGHT effusion. 3. Mild  venous congestion. Electronically Signed   By: Suzy Bouchard M.D.   On: 05/21/2022 08:22   US BIOPSY (KIDNEY)  Result Date: 05/19/2022 INDICATION: 72 year old female with history of end-stage renal disease. EXAM: ULTRASOUND GUIDED RENAL BIOPSY COMPARISON:  None Available. MEDICATIONS: None. ANESTHESIA/SEDATION: Fentanyl 25 mcg IV; Versed 0.5 mg IV Total Moderate Sedation time: 10 minutes; The patient was continuously monitored during the procedure by the interventional radiology nurse under my direct supervision. COMPLICATIONS: None immediate. PROCEDURE: Informed written consent was obtained from the patient after a discussion of the risks, benefits and alternatives to treatment. The patient understands and consents the procedure. A timeout was performed prior to the initiation of the procedure. Ultrasound scanning was performed of the bilateral flanks. The inferior pole of the left kidney was selected for biopsy due to location and sonographic window. The procedure was planned. The operative site was prepped and draped in the usual sterile fashion. The overlying soft tissues were anesthetized with 1% lidocaine with epinephrine. A 17 gauge coaxial introducer needle was advanced into the inferior cortex of the left kidney and 2 core biopsies were obtained under direct ultrasound guidance with a 16 gauge core biopsy device. Images were saved for documentation purposes. The biopsy device was removed and hemostasis was obtained with manual compression after injection of Gel-Foam slurry along the needle track under ultrasound guidance. Post procedural scanning was negative for significant post procedural hemorrhage or additional complication. A dressing was placed. The patient tolerated the procedure well without immediate post procedural complication. IMPRESSION: Technically successful ultrasound guided left renal biopsy. Ruthann Cancer, MD [facility]  Electronically Signed   By: Ruthann Cancer M.D.   On: 05/19/2022 14:19   DG Chest Port 1 View  Result Date: 05/18/2022 CLINICAL DATA:  Central line placement. EXAM: PORTABLE CHEST 1 VIEW COMPARISON:  CT chest and chest x-ray from 2 days ago. FINDINGS: New right internal jugular central venous catheter with tip at the cavoatrial junction. The heart size and mediastinal contours are within normal limits. Mild diffuse interstitial thickening similar. Decreasing small right and unchanged small left pleural effusions. Increasing asymmetric density in the peripheral left upper lobe. Similar atelectasis in the peripheral right  middle lobe. Improving right lower lobe atelectasis. No pneumothorax. No acute osseous abnormality. IMPRESSION: 1. New right internal jugular central venous catheter with tip at the cavoatrial junction. No pneumothorax. 2. Similar diffuse interstitial thickening with increasing asymmetric density in the peripheral left upper lobe. Differential considerations include infection or pulmonary edema. 3. Decreased small right and unchanged small left pleural effusions. Electronically Signed   By: Titus Dubin M.D.   On: 05/18/2022 14:06   US RENAL  Result Date: 05/18/2022 CLINICAL DATA:  Chronic kidney disease EXAM: RENAL / URINARY TRACT ULTRASOUND COMPLETE COMPARISON:  Abdomen CT report 01/13/2018 FINDINGS: Right Kidney: Renal measurements: 8.9 x 4.7 x 4.4 = volume: 97 mL. Kidney appears echogenic. No collecting system dilatation. There is a simple cyst along the midportion of the kidney which is anechoic with through transmission measuring 17 mm. Left Kidney: Renal measurements: 7.5 x 3.9 x 3.2 = volume: 49 mL. Small echogenic appearing kidney. No collecting system dilatation. Anechoic rounded structure seen in the mid kidney measuring 2 cm. Through transmission is smoothly marginated. Bladder: Appears normal for degree of bladder distention. Other: Scattered ascites.  Probable right-sided  pleural effusion. IMPRESSION: Echogenic small appearing kidneys. No collecting system dilatation. Bilateral simple appearing renal cysts. Ascites.  Right pleural effusion Electronically Signed   By: Jill Side M.D.   On: 05/18/2022 11:07   CT Chest Wo Contrast  Result Date: 05/16/2022 CLINICAL DATA:  72 year old female with history of suspected pneumonia. EXAM: CT CHEST WITHOUT CONTRAST TECHNIQUE: Multidetector CT imaging of the chest was performed following the standard protocol without IV contrast. RADIATION DOSE REDUCTION: This exam was performed according to the departmental dose-optimization program which includes automated exposure control, adjustment of the mA and/or kV according to patient size and/or use of iterative reconstruction technique. COMPARISON:  Chest x-ray 05/16/2022. FINDINGS: Cardiovascular: Heart size is normal. There is no significant pericardial fluid, thickening or pericardial calcification. Aortic atherosclerosis. No definite coronary artery calcifications. Mild calcifications of the aortic valve. Mediastinum/Nodes: No pathologically enlarged mediastinal or hilar lymph nodes. Please note that accurate exclusion of hilar adenopathy is limited on noncontrast CT scans. Esophagus is unremarkable in appearance. No axillary lymphadenopathy. Lungs/Pleura: Moderate to large right and small to moderate left pleural effusions lying dependently associated with areas of passive subsegmental atelectasis in the lower lobes of the lungs bilaterally. In addition, there are mid to upper lung predominant patchy areas of ground-glass attenuation and nodular appearing consolidative airspace disease, likely to reflect multilobar bilateral bronchopneumonia. The largest of these nodular appearing regions in the apex of the left upper lobe (axial image 32 of series 4) measuring 1.3 x 1.2 cm. Upper Abdomen: Aortic atherosclerosis. Tiny partially calcified gallstones lying dependently in the fundus of the  gallbladder. Small volume of ascites. Multiple renal lesions bilaterally, incompletely characterized on today's noncontrast CT examination, most notable for an exophytic intermediate attenuation lesion (25 HU) measuring 1.9 cm in the anterior aspect of the interpolar region of the left kidney. A cluster of nonobstructive calculi is also noted in the posterior aspect of the upper pole collecting system of the right kidney measuring up to 3 mm. Musculoskeletal: There are no aggressive appearing lytic or blastic lesions noted in the visualized portions of the skeleton. IMPRESSION: 1. The appearance the chest is most suggestive of multilobar bilateral bronchopneumonia with bilateral parapneumonic pleural effusions, as above. 2. Given the nodular appearance of some of the areas of apparent airspace consolidation, follow-up noncontrast chest CT is recommended in 3 months to ensure resolution  of the nodular areas following treatment for the patient's acute illness, to exclude underlying neoplasm. 3. Multiple renal lesions bilaterally, incompletely characterized on today's noncontrast CT examination, including an indeterminate 1.9 cm lesion in the anterior aspect of the interpolar region of the left kidney. Follow-up nonemergent outpatient abdominal MRI with and without IV gadolinium is recommended in the near future to definitively evaluate this lesion and exclude neoplasm. 4. Cholelithiasis. 5. Nonobstructive calculi in the right renal collecting system measuring up to 3 mm. 6. Ascites. 7. Aortic atherosclerosis. Aortic Atherosclerosis (ICD10-I70.0). Electronically Signed   By: Vinnie Langton M.D.   On: 05/16/2022 05:23   DG Chest Port 1 View  Result Date: 05/16/2022 CLINICAL DATA:  Shortness of breath EXAM: PORTABLE CHEST 1 VIEW COMPARISON:  05/07/2022 FINDINGS: Cardiac shadow is enlarged but stable. Small pleural effusion on the right is again noted. Some increased basilar density is noted on the right when  compared with the prior study. Postsurgical changes in the left breast are seen. No bony abnormality is noted. IMPRESSION: Persistent right-sided effusion with slight increase in basilar infiltrate on the right. Electronically Signed   By: Inez Catalina M.D.   On: 05/16/2022 03:45    Orson Eva, DO  [facility]  If 7PM-7AM, please contact night-coverage [email or web address] Password [facility] 05/27/2022, 4:54 PM   LOS: 11 days

## 2022-05-27 NOTE — Progress Notes (Signed)
  HEMODIALYSIS TREATMENT NOTE:  3.5 hour heparin-free treatment completed using RIJ TDC. Ultrafiltration was not possible due to hypotension.  Albumin 25g given once. Dr. Carles Collet was made aware of a large volume maroon liquid stool with bloody clots; orders written for H&H q6h.  All blood was returned.  Report called to [facility], RN.  Post-HD data:  05/27/22 1745  Vital Signs  Temp 98.4 F (36.9 C)  Temp Source Oral  Pulse Rate 99  Pulse Rate Source Monitor  Resp 18  BP (!) 130/44  BP Location Right Arm  BP Method Automatic  Patient Position (if appropriate) Lying  Oxygen Therapy  SpO2 95 %  O2 Device Room Air  Dialysis Weight  Weight 52.1 kg  Type of Weight Post-Dialysis  Post Treatment  Dialyzer Clearance Lightly streaked  Duration of HD Treatment -hour(s) 3.5 hour(s)  Hemodialysis Intake (mL) 0 mL  Liters Processed 84.2  Fluid Removed (mL) 0 mL  Tolerated HD Treatment No (Comment)  Post-Hemodialysis Comments UF limited by hypotension and anti-hypertensives given pre-HD.  Albumin given once.  No UF.  Education / Care Plan  Dialysis Education Provided Yes  Documented Education in Care Plan Yes  Outpatient Plan of Care Reviewed and on Chart Yes    Rockwell Alexandria, RN AP [facility]

## 2022-05-27 NOTE — Anesthesia Preprocedure Evaluation (Incomplete)
Anesthesia Evaluation  Patient identified by MRN, date of birth, ID band Patient awake    Reviewed: Allergy & Precautions, H&P , NPO status , Patient's Chart, lab work & pertinent test results  Airway        Dental  (+) Dental Advisory Given   Pulmonary pneumonia, COPD, former smoker   Pulmonary exam normal breath sounds clear to auscultation       Cardiovascular negative cardio ROS Normal cardiovascular exam Rhythm:Regular Rate:Normal     Neuro/Psych negative neurological ROS  negative psych ROS   GI/Hepatic Neg liver ROS, PUD,,,  Endo/Other  negative endocrine ROS    Renal/GU ESRF and DialysisRenal disease  negative genitourinary   Musculoskeletal  (+) Arthritis ,    Abdominal   Peds negative pediatric ROS (+)  Hematology  (+) Blood dyscrasia, anemia   Anesthesia Other Findings   Reproductive/Obstetrics negative OB ROS                              Anesthesia Physical Anesthesia Plan  ASA: 4 and emergent  Anesthesia Plan: General   Post-op Pain Management: Dilaudid IV   Induction: Intravenous, Cricoid pressure planned and Rapid sequence  PONV Risk Score and Plan: 4 or greater and Dexamethasone and Treatment may vary due to age or medical condition  Airway Management Planned: Oral ETT  Additional Equipment:   Intra-op Plan:   Post-operative Plan: Post-operative intubation/ventilation  Informed Consent: I have reviewed the patients History and Physical, chart, labs and discussed the procedure including the risks, benefits and alternatives for the proposed anesthesia with the patient or authorized representative who has indicated his/her understanding and acceptance.     Dental advisory given  Plan Discussed with: CRNA and Surgeon  Anesthesia Plan Comments:          Anesthesia Quick Evaluation

## 2022-05-27 NOTE — Progress Notes (Signed)
SBP elevated, DBP low. Other vitals stable. Pt reported 0/10 pain and slept through the night. Non-pitting edema assessed in bilateral lower extremities. Pt presents with flat affect.

## 2022-05-27 NOTE — Consult Note (Signed)
[facility] Consult  Reason for Consult: Free air related to ulcer disease  Referring Physician: Dr. Jenetta Downer, Dr. Josephine Cables   Chief Complaint   Shortness of Breath     HPI: Sheri Cooper is a 72 y.o. female with CKD now on dialysis, RA COPD who comes in with acute GI bleed that demonstrated duodenal ulcers that were bleeding.  She was started on dialysis while she was here for this admission and today in dialysis she started to bleed again. The RN reports her abdomen was also very distended. A CTA was done to determine if she was still bleeding. This did not show any active bleed but did show a moderate amount of free air. She is awake and oriented. She says she has been having some abdominal pain.  [place]. She says her brother Trilby Drummer know that she is going to have to get surgery but that she does not want anyone calling anyone tonight after surgery. She has two children but one lives in [place] and one in the [place].   Past Medical History:  Diagnosis Date   Chronic kidney disease (CKD), stage IV (severe) (HCC)    COPD (chronic obstructive pulmonary disease) ([place])    Rheumatoid arthritis ([place])     Past Surgical History:  Procedure Laterality Date   COLONOSCOPY WITH PROPOFOL N/A 05/22/2022   Procedure: COLONOSCOPY WITH PROPOFOL;  Surgeon: Harvel Quale, MD;  [facility];  Service: Gastroenterology;  Laterality: N/A;   ESOPHAGOGASTRODUODENOSCOPY (EGD) WITH PROPOFOL N/A 05/22/2022   Procedure: ESOPHAGOGASTRODUODENOSCOPY (EGD) WITH PROPOFOL;  Surgeon: Harvel Quale, MD;  Location[facility];  Service: Gastroenterology;  Laterality: N/A;   IR FLUORO GUIDE CV LINE RIGHT  05/24/2022   IR US GUIDE VASC ACCESS RIGHT  05/24/2022    History reviewed. No pertinent family history.  Social History   Tobacco Use   Smoking status: Former    Types: Cigarettes   Smokeless tobacco: Never   Substance Use Topics   Drug use: Never    Medications: I have reviewed the patient's current medications. Prior to Admission:  Medications Prior to Admission  Medication Sig Dispense Refill Last Dose   ABRYSVO 120 MCG/0.5ML injection Inject 0.5 mLs into the muscle once.      amLODipine (NORVASC) 10 MG tablet Take 10 mg by mouth daily.   Past Week   carvedilol (COREG) 25 MG tablet Take 25 mg by mouth 2 (two) times daily with a meal.   05/15/2022 at unk   hydrALAZINE (APRESOLINE) 100 MG tablet Take 100 mg by mouth 2 (two) times daily.   Past Week   hydrochlorothiazide (HYDRODIURIL) 25 MG tablet Take 25 mg by mouth daily.   Past Week   rosuvastatin (CRESTOR) 20 MG tablet Take 20 mg by mouth daily.      sertraline (ZOLOFT) 50 MG tablet Take 1 tablet by mouth daily.   Past Week   Adalimumab 20 MG/0.4ML PSKT Inject into the skin.      metoprolol succinate (TOPROL-XL) 50 MG 24 hr tablet Take 50 mg by mouth daily. (Patient not taking: Reported on 05/17/2022)   Not Taking at unk   Scheduled:  (feeding supplement) PROSource Plus  30 mL Oral TID BM   sodium chloride   Intravenous Once   sodium chloride   Intravenous Once   calcium acetate  667 mg Oral TID WC   Chlorhexidine Gluconate Cloth  6 each Topical Daily  Chlorhexidine Gluconate Cloth  6 each Topical Q0600   [START ON 06/01/2022] darbepoetin (ARANESP) injection - DIALYSIS  200 mcg Subcutaneous Q Tue-1800   dextromethorphan-guaiFENesin  1 tablet Oral BID   feeding supplement  1 Container Oral TID BM   pantoprazole  40 mg Intravenous Q12H   predniSONE  60 mg Oral Q breakfast   sucralfate  1 g Oral TID WC & HS   Continuous:  albumin human     HUD:JSHFWYOVZCHYI **OR** acetaminophen, albumin human, alteplase, fentaNYL (SUBLIMAZE) injection, heparin, ipratropium-albuterol, labetalol, loperamide, oxyCODONE, prochlorperazine  No Known Allergies   ROS:  A comprehensive review of systems was negative except for: Gastrointestinal: positive  for abdominal pain and GI bleed  Blood pressure (!) 143/44, pulse 95, temperature 97.8 F (36.6 C), temperature source Oral, resp. rate 14, height '4\' 11"'$  (1.499 m), weight 52.1 kg, SpO2 97 %. Physical Exam Vitals reviewed.  HENT:     Head: Normocephalic.  Eyes:     Pupils: Pupils are equal, round, and reactive to light.  Cardiovascular:     Rate and Rhythm: Regular rhythm.  Pulmonary:     Effort: Pulmonary effort is normal.  Abdominal:     Palpations: Abdomen is soft.     Tenderness: There is abdominal tenderness. There is guarding.     Comments: Some guarding in the epigastric region  Musculoskeletal:        General: Normal range of motion.  Skin:    General: Skin is warm.  Neurological:     General: No focal deficit present.     Mental Status: She is alert and oriented to person, place, and time.  Psychiatric:        Mood and Affect: Mood normal.     Results: Results for orders placed or performed during the hospital encounter of 05/16/22 (from the past 48 hour(s))  Renal function panel     Status: Abnormal   Collection Time: 05/26/22  4:10 AM  Result Value Ref Range   Sodium 135 135 - 145 mmol/L   Potassium 3.6 3.5 - 5.1 mmol/L   Chloride 99 98 - 111 mmol/L   CO2 24 22 - 32 mmol/L   Glucose, Bld 64 (L) 70 - 99 mg/dL    Comment: Glucose reference range applies only to samples taken after fasting for at least 8 hours.   BUN 19 8 - 23 mg/dL   Creatinine, Ser 2.00 (H) 0.44 - 1.00 mg/dL    Comment: DELTA CHECK NOTED   Calcium 7.1 (L) 8.9 - 10.3 mg/dL   Phosphorus 2.5 2.5 - 4.6 mg/dL   Albumin 1.9 (L) 3.5 - 5.0 g/dL   GFR, Estimated 26 (L) >60 mL/min    Comment: (NOTE) Calculated using the CKD-EPI Creatinine Equation (2021)    Anion gap 12 5 - 15    Comment: Performed at [facility], [address], [place], [place] [address]  CBC     Status: Abnormal   Collection Time: 05/26/22  4:10 AM  Result Value Ref Range   WBC 28.8 (H) 4.0 - 10.5 K/uL   RBC 2.95 (L) 3.87 -  5.11 MIL/uL   Hemoglobin 8.4 (L) 12.0 - 15.0 g/dL   HCT 26.0 (L) 36.0 - 46.0 %   MCV 88.1 80.0 - 100.0 fL   MCH 28.5 26.0 - 34.0 pg   MCHC 32.3 30.0 - 36.0 g/dL   RDW 18.4 (H) 11.5 - 15.5 %   Platelets 165 150 - 400 K/uL   nRBC 0.0  0.0 - 0.2 %    Comment: Performed at [facility], [address] [place], [place] [address]  Glucose, capillary     Status: Abnormal   Collection Time: 05/26/22  7:34 AM  Result Value Ref Range   Glucose-Capillary 50 (L) 70 - 99 mg/dL    Comment: Glucose reference range applies only to samples taken after fasting for at least 8 hours.  Glucose, capillary     Status: None   Collection Time: 05/26/22  8:19 AM  Result Value Ref Range   Glucose-Capillary 95 70 - 99 mg/dL    Comment: Glucose reference range applies only to samples taken after fasting for at least 8 hours.  Glucose, capillary     Status: Abnormal   Collection Time: 05/26/22 11:11 AM  Result Value Ref Range   Glucose-Capillary 107 (H) 70 - 99 mg/dL    Comment: Glucose reference range applies only to samples taken after fasting for at least 8 hours.  Renal function panel     Status: Abnormal   Collection Time: 05/27/22  4:20 AM  Result Value Ref Range   Sodium 136 135 - 145 mmol/L   Potassium 4.5 3.5 - 5.1 mmol/L    Comment: DELTA CHECK NOTED   Chloride 99 98 - 111 mmol/L   CO2 26 22 - 32 mmol/L   Glucose, Bld 94 70 - 99 mg/dL    Comment: Glucose reference range applies only to samples taken after fasting for at least 8 hours.   BUN 33 (H) 8 - 23 mg/dL   Creatinine, Ser 2.98 (H) 0.44 - 1.00 mg/dL   Calcium 7.4 (L) 8.9 - 10.3 mg/dL   Phosphorus 2.6 2.5 - 4.6 mg/dL   Albumin 1.9 (L) 3.5 - 5.0 g/dL   GFR, Estimated 16 (L) >60 mL/min    Comment: (NOTE) Calculated using the CKD-EPI Creatinine Equation (2021)    Anion gap 11 5 - 15    Comment: Performed at [facility], [address] [place], [place] [address]  CBC     Status: Abnormal   Collection Time: 05/27/22  4:20 AM  Result Value  Ref Range   WBC 24.3 (H) 4.0 - 10.5 K/uL   RBC 2.84 (L) 3.87 - 5.11 MIL/uL   Hemoglobin 8.0 (L) 12.0 - 15.0 g/dL   HCT 24.6 (L) 36.0 - 46.0 %   MCV 86.6 80.0 - 100.0 fL   MCH 28.2 26.0 - 34.0 pg   MCHC 32.5 30.0 - 36.0 g/dL   RDW 18.0 (H) 11.5 - 15.5 %   Platelets 203 150 - 400 K/uL   nRBC 0.0 0.0 - 0.2 %    Comment: Performed at [facility], [address], [place], [place] [address]  Hemoglobin and hematocrit, blood     Status: Abnormal   Collection Time: 05/27/22  6:31 PM  Result Value Ref Range   Hemoglobin 4.3 (LL) 12.0 - 15.0 g/dL    Comment: REPEATED TO VERIFY THIS CRITICAL RESULT HAS VERIFIED AND BEEN CALLED TO H SOCIN RN BY KIRSTENE FORSYTH ON 01 25 2024 AT 1852, AND HAS BEEN READ BACK.     HCT 13.4 (L) 36.0 - 46.0 %    Comment: Performed at [facility], [address], [place], [place] [address]  Type and screen [facility]     Status: None (Preliminary result)   Collection Time: 05/27/22  6:31 PM  Result Value Ref Range   ABO/RH(D) O POS    Antibody Screen NEG  Sample Expiration 05/30/2022,2359    Unit Number [id number]    Blood Component Type RED CELLS,LR    Unit division 00    Status of Unit ISSUED    Transfusion Status OK TO TRANSFUSE    Crossmatch Result      Compatible Performed at [facility], [address] [place], [place] [address]    Unit Number [id number]    Blood Component Type RED CELLS,LR    Unit division 00    Status of Unit ISSUED    Transfusion Status OK TO TRANSFUSE    Crossmatch Result Compatible    Unit Number [id number]    Blood Component Type RED CELLS,LR    Unit division 00    Status of Unit ALLOCATED    Transfusion Status OK TO TRANSFUSE    Crossmatch Result Compatible    Unit Number [id number]    Blood Component Type RED CELLS,LR    Unit division 00    Status of Unit ALLOCATED    Transfusion Status OK TO TRANSFUSE    Crossmatch Result Compatible    Unit Number [id number]    Blood Component Type RED  CELLS,LR    Unit division 00    Status of Unit ALLOCATED    Transfusion Status OK TO TRANSFUSE    Crossmatch Result Compatible   Prepare RBC (crossmatch)     Status: None   Collection Time: 05/27/22  7:30 PM  Result Value Ref Range   Order Confirmation      ORDER PROCESSED BY BLOOD BANK Performed at [facility], [address] [place], [place] [address]   Prepare RBC (crossmatch)     Status: None   Collection Time: 05/27/22 10:54 PM  Result Value Ref Range   Order Confirmation      ORDER PROCESSED BY BLOOD BANK Performed at [facility], [address] [place], [place] [address]    Personally reviewed - moderate free air anteriorly, likely come from perforated ulcer disease at the duodenum, significant free fluid around the liver and duodenum  CT ANGIO GI BLEED  Result Date: 05/27/2022 CLINICAL DATA:  Recurrent gastrointestinal hemorrhage EXAM: CTA ABDOMEN AND PELVIS WITHOUT AND WITH CONTRAST TECHNIQUE: Multidetector CT imaging of the abdomen and pelvis was performed using the standard protocol during bolus administration of intravenous contrast. Multiplanar reconstructed images and MIPs were obtained and reviewed to evaluate the vascular anatomy. RADIATION DOSE REDUCTION: This exam was performed according to the departmental dose-optimization program which includes automated exposure control, adjustment of the mA and/or kV according to patient size and/or use of iterative reconstruction technique. CONTRAST:  36m OMNIPAQUE IOHEXOL 350 MG/ML SOLN COMPARISON:  None Available. FINDINGS: VASCULAR Aorta: Moderate mixed atherosclerotic plaque. No aortic aneurysm or dissection. No periaortic inflammatory change identified. Celiac: Patent without evidence of aneurysm, dissection, vasculitis or significant stenosis. SMA: Patent without evidence of aneurysm, dissection, vasculitis or significant stenosis. Renals: High-grade (near occlusion) stenosis of the a left renal artery origin. Less than 50%  stenosis of the right renal artery origin. Normal vascular morphology. Relatively diminutive caliber of the left renal arterial vasculature distal the stenosis may relate to under filling. No aneurysm or dissection. IMA: Occluded at its origin with reconstitution via the marginal artery. 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 Focal irregular minimum 50% stenosis of the left common iliac artery secondary to a short segment dissection flap within the proximal left common iliac artery. Right internal iliac artery is heavily diseased and occluded at its  origin with subsequent reconstitution. High-grade focal stenosis of the left internal iliac artery at its origin. Proximal Outflow: Bilateral common femoral and visualized portions of the superficial and profunda femoral arteries are patent without evidence of aneurysm, dissection, vasculitis or significant stenosis. Veins: Mesenteric, splenic, portal venous, and hepatic venous structures are patent. Iliofemoral veins are patent. Renal veins and inferior vena cava are patent. Review of the MIP images confirms the above findings. NON-VASCULAR Lower chest: Small left and moderate right pleural effusions are present with bibasilar compressive atelectasis. Hepatobiliary: Cholelithiasis without pericholecystic inflammatory change. Liver unremarkable. No intra or extrahepatic biliary ductal dilation. Pancreas: Unremarkable Spleen: Unremarkable Adrenals/Urinary Tract: The adrenal glands are unremarkable. Moderate left renal cortical atrophy. Right kidney is normal in size. 6 mm plaque-like nonobstructing calculus noted within the upper pole the right kidney. Multiple simple cortical cysts are seen within the right kidney. Hypodense 16 mm lesion is seen within the interpolar region of the right kidney demonstrates no significant enhancement is compatible with a Bosniak class 2 cyst. No follow-up imaging is recommended for these lesions.  No hydronephrosis. The bladder is unremarkable. Stomach/Bowel: There is mild free intraperitoneal gas present. The exact site of visceral perforation is not clearly identified on this examination, however, there is extraluminal gas and debris surrounding the first and proximal second portion of the duodenum raising the question of a perforation in this location. Endoscopic clip noted within the juncture of the first and second portion of the duodenum. Moderate free intraperitoneal fluid. Endoscopic clip laying dependently within the cecum. The stomach, small bowel, and large bowel are otherwise unremarkable. No active gastrointestinal hemorrhage identified. Fluid within the descending colon and rectal vault may present clinically as diarrhea. Lymphatic: No pathologic adenopathy within the abdomen and pelvis. Reproductive: Involuted fibroid within the uterus. The pelvic organs are otherwise unremarkable. Other: Moderate subcutaneous body wall edema noted dependently. Musculoskeletal: No acute bone abnormality. No lytic or blastic bone lesion. IMPRESSION: VASCULAR . 1. 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 2. 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 Focal irregular minimum 50% stenosis of the left common iliac artery secondary to a short segment dissection flap. 3. 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 High-grade focal stenosis of the left internal iliac artery at its origin. 4. 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 NON-VASCULAR . 1. Moderate free intraperitoneal gas and fluid in keeping with visceral perforation. The exact site is not clearly identified on this examination, however, more focal debris and gas adjacent to the juncture of the first and second portion of the duodenum raises  the question of a perforation in this location. 2. No active gastrointestinal hemorrhage identified 3. Cholelithiasis. 4. Mild right nonobstructing nephrolithiasis. 5. Fluid within the descending colon and rectal vault may present clinically as diarrhea. Aortic Atherosclerosis (ICD10-I70.0). Electronically Signed   By: Fidela Salisbury M.D.   On: 05/27/2022 21:51     Assessment & Plan:  LAURINA FISCHL is a 72 y.o. female with free air and fluid. Leukocytosis on labs leading up to this. I do not know when she perforated but it sounds like her abdomen became more distended today. She says she has had some pain and is tender on exam in the epigastric region with some guarding. She is receiving blood now and is on her 2nd unit. Stat H&H  now. Lab will not draw BMP or INR for 1 hour. No obvious hemorrhage on the CTA now.   Patient does not want me to call her brother or children. She is oriented X 4 and says she does not want anyone to worry her family until the Am.    She remains hemodynamically stable right now.  Discussed risk of bleeding, infection, re-bleeding, not being able to take in po due to perforation and ulcer, risk of remaining intubated, risk of needing SNF due to weakness and hospitalization, risk of dying given the bleeding, permanent renal failure and dialysis, possible septic shock that could result.  Discussed arterial line and central line placement, risk of bleeding, infection, pneumothorax, having to have a femoral CVL potentially given her dialysis line.   All questions were answered to the satisfaction of the patient.   Virl Cagey 05/27/2022, 11:34 PM

## 2022-05-27 NOTE — Progress Notes (Signed)
Patient transferred to ICU for close management.   Hemoglobin of 4.3.  Report given and patient transferred.  Patient has had no complaints while on floor. CT had been done prior to ICU and one unit of blood infusion upon transfer to icu.

## 2022-05-27 NOTE — Anesthesia Preprocedure Evaluation (Addendum)
Anesthesia Evaluation  Patient identified by MRN, date of birth, ID band Patient awake    Reviewed: Allergy & Precautions, H&P , NPO status , Patient's Chart, lab work & pertinent test results  Airway Mallampati: II  TM Distance: >3 FB Neck ROM: Full    Dental  (+) Dental Advisory Given, Missing   Pulmonary pneumonia, COPD, former smoker   Pulmonary exam normal breath sounds clear to auscultation       Cardiovascular negative cardio ROS Normal cardiovascular exam Rhythm:Regular Rate:Normal     Neuro/Psych negative neurological ROS  negative psych ROS   GI/Hepatic Neg liver ROS, PUD,,,Perforated viscus   Endo/Other  negative endocrine ROS    Renal/GU ESRF and DialysisRenal disease  negative genitourinary   Musculoskeletal  (+) Arthritis ,    Abdominal   Peds negative pediatric ROS (+)  Hematology  (+) Blood dyscrasia, anemia   Anesthesia Other Findings   Reproductive/Obstetrics negative OB ROS                             Anesthesia Physical Anesthesia Plan  ASA: 4 and emergent  Anesthesia Plan: General   Post-op Pain Management: Dilaudid IV   Induction: Intravenous, Cricoid pressure planned and Rapid sequence  PONV Risk Score and Plan: 4 or greater and Dexamethasone and Treatment may vary due to age or medical condition  Airway Management Planned: Oral ETT  Additional Equipment:   Intra-op Plan:   Post-operative Plan: Post-operative intubation/ventilation  Informed Consent: I have reviewed the patients History and Physical, chart, labs and discussed the procedure including the risks, benefits and alternatives for the proposed anesthesia with the patient or authorized representative who has indicated his/her understanding and acceptance.     Dental advisory given  Plan Discussed with: CRNA and Surgeon  Anesthesia Plan Comments:         Anesthesia Quick  Evaluation

## 2022-05-27 NOTE — Progress Notes (Signed)
Notified by RN Hgb dropped to 4.3 from 8.0 this morning. Pt remains hemodynamically stable with good mentation.   I discussed situation with GI, Dr. Jenetta Downer, nephrology, Dr. Carolin Sicks I also discussed with patient and her brother and explained the need for CTA abd to clarify if pt has active bleed I explained to patient and brother that the contrast load may impede her renal function and may render her dialysis dependent. They both understand the risk of worsening renal function and need for permanent dialysis in order to stop her bleeding  GI and Renal are aware and agree with plan to move forward with CTA  abdomen.   If CTA shows sign of active bleed, IR will need to be consulted for embolization.  Patient case discussed with oncoming provider, Dr. Josephine Cables.  Orson Eva, DO

## 2022-05-27 NOTE — Progress Notes (Signed)
I was reached in the afternoon by Dr. Carles Collet regarding recurrent episode of large hematochezia, recommended perfoming CT angio. Dr. Carles Collet discussed with family and patient, who agreed on proceeding with CT and transfusing given severe anemia in the 4s.  At 22:25 PM I reviewed the imaging performed tonight and read at 21:51. Report showed presence of intraperitoneal gas and fluid concerning for visceral perforation- based on the location likely coming from 1-2nd portion of duodenum, no acute bleeding source was found.  I did not see any note in the medical chart regarding an action regarding this new finding. I called the on call hospitalist to discuss this finding. I spoke with the ICU RN who reported patient had worsening abdominal distention without abdominal pain, was HD stable.  I called the on call surgeon Dr. Constance Haw to discuss findings - she will call family and evaluate patient for management of acute perforated viscus.  Notably, CXR and KUB from 05/25/2022 did not show findings concerning for bowel perforation.  Gastroenterology will keep following the patient.

## 2022-05-28 ENCOUNTER — Inpatient Hospital Stay (HOSPITAL_COMMUNITY): Payer: Medicare Other | Admitting: Anesthesiology

## 2022-05-28 ENCOUNTER — Inpatient Hospital Stay (HOSPITAL_COMMUNITY): Payer: Medicare Other

## 2022-05-28 DIAGNOSIS — K668 Other specified disorders of peritoneum: Secondary | ICD-10-CM | POA: Insufficient documentation

## 2022-05-28 DIAGNOSIS — K631 Perforation of intestine (nontraumatic): Secondary | ICD-10-CM

## 2022-05-28 DIAGNOSIS — K659 Peritonitis, unspecified: Secondary | ICD-10-CM | POA: Diagnosis not present

## 2022-05-28 DIAGNOSIS — K922 Gastrointestinal hemorrhage, unspecified: Secondary | ICD-10-CM | POA: Diagnosis not present

## 2022-05-28 DIAGNOSIS — K265 Chronic or unspecified duodenal ulcer with perforation: Secondary | ICD-10-CM

## 2022-05-28 DIAGNOSIS — D62 Acute posthemorrhagic anemia: Secondary | ICD-10-CM | POA: Diagnosis not present

## 2022-05-28 DIAGNOSIS — Z9889 Other specified postprocedural states: Secondary | ICD-10-CM | POA: Diagnosis not present

## 2022-05-28 DIAGNOSIS — J189 Pneumonia, unspecified organism: Secondary | ICD-10-CM | POA: Diagnosis not present

## 2022-05-28 LAB — COMPREHENSIVE METABOLIC PANEL
ALT: 6 U/L (ref 0–44)
AST: 24 U/L (ref 15–41)
Albumin: 2.1 g/dL — ABNORMAL LOW (ref 3.5–5.0)
Alkaline Phosphatase: 70 U/L (ref 38–126)
Anion gap: 11 (ref 5–15)
BUN: 21 mg/dL (ref 8–23)
CO2: 23 mmol/L (ref 22–32)
Calcium: 6.6 mg/dL — ABNORMAL LOW (ref 8.9–10.3)
Chloride: 101 mmol/L (ref 98–111)
Creatinine, Ser: 1.71 mg/dL — ABNORMAL HIGH (ref 0.44–1.00)
GFR, Estimated: 31 mL/min — ABNORMAL LOW (ref >60)
Glucose, Bld: 97 mg/dL (ref 70–99)
Potassium: 3.5 mmol/L (ref 3.5–5.1)
Sodium: 135 mmol/L (ref 135–145)
Total Bilirubin: 0.6 mg/dL (ref 0.3–1.2)
Total Protein: 4.7 g/dL — ABNORMAL LOW (ref 6.5–8.1)

## 2022-05-28 LAB — CBC
HCT: 31.7 % — ABNORMAL LOW (ref 36.0–46.0)
Hemoglobin: 10.5 g/dL — ABNORMAL LOW (ref 12.0–15.0)
MCH: 26.9 pg (ref 26.0–34.0)
MCHC: 33.1 g/dL (ref 30.0–36.0)
MCV: 81.1 fL (ref 80.0–100.0)
Platelets: 147 10*3/uL — ABNORMAL LOW (ref 150–400)
RBC: 3.91 MIL/uL (ref 3.87–5.11)
RDW: 18.8 % — ABNORMAL HIGH (ref 11.5–15.5)
WBC: 18.6 10*3/uL — ABNORMAL HIGH (ref 4.0–10.5)
nRBC: 0.2 % (ref 0.0–0.2)

## 2022-05-28 LAB — URINALYSIS, ROUTINE W REFLEX MICROSCOPIC
Bilirubin Urine: NEGATIVE
Glucose, UA: NEGATIVE mg/dL
Ketones, ur: NEGATIVE mg/dL
Nitrite: NEGATIVE
Protein, ur: >=300 mg/dL — AB
Specific Gravity, Urine: 1.016 (ref 1.005–1.030)
WBC, UA: >50 WBC/hpf (ref 0–5)
pH: 7 (ref 5.0–8.0)

## 2022-05-28 LAB — GLUCOSE, CAPILLARY
Glucose-Capillary: 101 mg/dL — ABNORMAL HIGH (ref 70–99)
Glucose-Capillary: 127 mg/dL — ABNORMAL HIGH (ref 70–99)
Glucose-Capillary: 110 mg/dL — ABNORMAL HIGH (ref 70–99)
Glucose-Capillary: 118 mg/dL — ABNORMAL HIGH (ref 70–99)
Glucose-Capillary: 121 mg/dL — ABNORMAL HIGH (ref 70–99)

## 2022-05-28 LAB — URINALYSIS, COMPLETE (UACMP) WITH MICROSCOPIC
Bilirubin Urine: NEGATIVE
Glucose, UA: NEGATIVE mg/dL
Ketones, ur: NEGATIVE mg/dL
Nitrite: NEGATIVE
Protein, ur: >=300 mg/dL — AB
RBC / HPF: >50 RBC/hpf (ref 0–5)
Specific Gravity, Urine: 1.018 (ref 1.005–1.030)
WBC, UA: >50 WBC/hpf (ref 0–5)
pH: 7 (ref 5.0–8.0)

## 2022-05-28 LAB — MAGNESIUM: Magnesium: 1.5 mg/dL — ABNORMAL LOW (ref 1.7–2.4)

## 2022-05-28 LAB — RENAL FUNCTION PANEL
Albumin: 2.2 g/dL — ABNORMAL LOW (ref 3.5–5.0)
Anion gap: 10 (ref 5–15)
BUN: 22 mg/dL (ref 8–23)
CO2: 24 mmol/L (ref 22–32)
Calcium: 6.6 mg/dL — ABNORMAL LOW (ref 8.9–10.3)
Chloride: 101 mmol/L (ref 98–111)
Creatinine, Ser: 1.76 mg/dL — ABNORMAL HIGH (ref 0.44–1.00)
GFR, Estimated: 30 mL/min — ABNORMAL LOW (ref >60)
Glucose, Bld: 98 mg/dL (ref 70–99)
Phosphorus: 3.1 mg/dL (ref 2.5–4.6)
Potassium: 3.4 mmol/L — ABNORMAL LOW (ref 3.5–5.1)
Sodium: 135 mmol/L (ref 135–145)

## 2022-05-28 LAB — BLOOD GAS, ARTERIAL
Acid-Base Excess: 0.5 mmol/L (ref 0.0–2.0)
Bicarbonate: 25.4 mmol/L (ref 20.0–28.0)
Drawn by: 21310
FIO2: 100 %
O2 Saturation: 100 %
Patient temperature: 35.2
pCO2 arterial: 38 mmHg (ref 32–48)
pH, Arterial: 7.43 (ref 7.35–7.45)
pO2, Arterial: 238 mmHg — ABNORMAL HIGH (ref 83–108)

## 2022-05-28 LAB — HEMOGLOBIN AND HEMATOCRIT, BLOOD
HCT: 28.4 % — ABNORMAL LOW (ref 36.0–46.0)
HCT: 29.1 % — ABNORMAL LOW (ref 36.0–46.0)
HCT: 30.6 % — ABNORMAL LOW (ref 36.0–46.0)
HCT: 30.9 % — ABNORMAL LOW (ref 36.0–46.0)
Hemoglobin: 10.2 g/dL — ABNORMAL LOW (ref 12.0–15.0)
Hemoglobin: 10.3 g/dL — ABNORMAL LOW (ref 12.0–15.0)
Hemoglobin: 9.5 g/dL — ABNORMAL LOW (ref 12.0–15.0)
Hemoglobin: 9.6 g/dL — ABNORMAL LOW (ref 12.0–15.0)

## 2022-05-28 LAB — TRIGLYCERIDES: Triglycerides: 102 mg/dL (ref <150)

## 2022-05-28 LAB — SURGICAL PATHOLOGY

## 2022-05-28 LAB — PHOSPHORUS: Phosphorus: 3.1 mg/dL (ref 2.5–4.6)

## 2022-05-28 MED ORDER — HYDRALAZINE HCL 20 MG/ML IJ SOLN
5.0000 mg | Freq: Three times a day (TID) | INTRAMUSCULAR | Status: DC
  Administered 2022-05-28 – 2022-06-05 (×25): 5 mg via INTRAVENOUS
  Filled 2022-05-28 (×25): qty 1

## 2022-05-28 MED ORDER — LIDOCAINE HCL (CARDIAC) PF 100 MG/5ML IV SOSY
PREFILLED_SYRINGE | INTRAVENOUS | Status: DC | PRN
  Administered 2022-05-28: 60 mg via INTRATRACHEAL

## 2022-05-28 MED ORDER — PROPOFOL 10 MG/ML IV BOLUS
INTRAVENOUS | Status: DC | PRN
  Administered 2022-05-28: 50 mg via INTRAVENOUS

## 2022-05-28 MED ORDER — METHYLPREDNISOLONE SODIUM SUCC 40 MG IJ SOLR
40.0000 mg | Freq: Every day | INTRAMUSCULAR | Status: DC
  Administered 2022-05-28 – 2022-06-02 (×6): 40 mg via INTRAVENOUS
  Filled 2022-05-28 (×6): qty 1

## 2022-05-28 MED ORDER — TRAVASOL 10 % IV SOLN
INTRAVENOUS | Status: AC
  Filled 2022-05-28: qty 460.8

## 2022-05-28 MED ORDER — FLUCONAZOLE IN SODIUM CHLORIDE 200-0.9 MG/100ML-% IV SOLN
200.0000 mg | INTRAVENOUS | Status: DC
  Administered 2022-05-28 – 2022-06-05 (×9): 200 mg via INTRAVENOUS
  Filled 2022-05-28 (×10): qty 100

## 2022-05-28 MED ORDER — MIDAZOLAM HCL 5 MG/5ML IJ SOLN
INTRAMUSCULAR | Status: DC | PRN
  Administered 2022-05-28: 2 mg via INTRAVENOUS

## 2022-05-28 MED ORDER — POTASSIUM CHLORIDE 10 MEQ/100ML IV SOLN
10.0000 meq | INTRAVENOUS | Status: AC
  Administered 2022-05-28 (×4): 10 meq via INTRAVENOUS
  Filled 2022-05-28 (×4): qty 100

## 2022-05-28 MED ORDER — FENTANYL CITRATE (PF) 100 MCG/2ML IJ SOLN
INTRAMUSCULAR | Status: AC
  Filled 2022-05-28: qty 2

## 2022-05-28 MED ORDER — BUPIVACAINE HCL (PF) 0.5 % IJ SOLN
INTRAMUSCULAR | Status: AC
  Filled 2022-05-28: qty 30

## 2022-05-28 MED ORDER — SODIUM CHLORIDE 0.9 % IR SOLN
Status: DC | PRN
  Administered 2022-05-28 (×2): 1000 mL

## 2022-05-28 MED ORDER — FENTANYL CITRATE (PF) 100 MCG/2ML IJ SOLN
INTRAMUSCULAR | Status: DC | PRN
  Administered 2022-05-28 (×7): 50 ug via INTRAVENOUS

## 2022-05-28 MED ORDER — MAGNESIUM SULFATE 2 GM/50ML IV SOLN
2.0000 g | Freq: Once | INTRAVENOUS | Status: AC
  Administered 2022-05-28: 2 g via INTRAVENOUS
  Filled 2022-05-28: qty 50

## 2022-05-28 MED ORDER — SUCCINYLCHOLINE CHLORIDE 200 MG/10ML IV SOSY
PREFILLED_SYRINGE | INTRAVENOUS | Status: DC | PRN
  Administered 2022-05-28: 100 mg via INTRAVENOUS

## 2022-05-28 MED ORDER — SODIUM CHLORIDE 0.9 % IV SOLN: INTRAVENOUS | Status: DC | PRN

## 2022-05-28 MED ORDER — PHENYLEPHRINE HCL-NACL 20-0.9 MG/250ML-% IV SOLN
INTRAVENOUS | Status: DC | PRN
  Administered 2022-05-28: 20 ug/min via INTRAVENOUS

## 2022-05-28 MED ORDER — HYDROMORPHONE HCL 1 MG/ML IJ SOLN
0.5000 mg | INTRAMUSCULAR | Status: DC | PRN
  Administered 2022-05-28 – 2022-06-05 (×30): 0.5 mg via INTRAVENOUS
  Filled 2022-05-28 (×30): qty 0.5

## 2022-05-28 MED ORDER — DEXAMETHASONE SODIUM PHOSPHATE 10 MG/ML IJ SOLN
INTRAMUSCULAR | Status: DC | PRN
  Administered 2022-05-28: 5 mg via INTRAVENOUS

## 2022-05-28 MED ORDER — CHLORHEXIDINE GLUCONATE CLOTH 2 % EX PADS
6.0000 | MEDICATED_PAD | Freq: Every day | CUTANEOUS | Status: DC
  Administered 2022-05-28 – 2022-06-05 (×8): 6 via TOPICAL

## 2022-05-28 MED ORDER — SODIUM CHLORIDE 0.9 % IV SOLN
INTRAVENOUS | Status: AC
  Filled 2022-05-28: qty 2

## 2022-05-28 MED ORDER — PIPERACILLIN-TAZOBACTAM IN DEX 2-0.25 GM/50ML IV SOLN
2.2500 g | Freq: Three times a day (TID) | INTRAVENOUS | Status: DC
  Administered 2022-05-28 – 2022-05-30 (×7): 2.25 g via INTRAVENOUS
  Filled 2022-05-28 (×11): qty 50

## 2022-05-28 MED ORDER — METHYLPREDNISOLONE SODIUM SUCC 125 MG IJ SOLR: 60.0000 mg | Freq: Every day | INTRAMUSCULAR | Status: DC

## 2022-05-28 MED ORDER — BUPIVACAINE HCL (PF) 0.5 % IJ SOLN
INTRAMUSCULAR | Status: DC | PRN
  Administered 2022-05-28: 20 mL

## 2022-05-28 MED ORDER — ROCURONIUM BROMIDE 10 MG/ML (PF) SYRINGE
PREFILLED_SYRINGE | INTRAVENOUS | Status: DC | PRN
  Administered 2022-05-28: 50 mg via INTRAVENOUS

## 2022-05-28 MED ORDER — SODIUM CHLORIDE 0.9 % IV SOLN
INTRAVENOUS | Status: DC | PRN
  Administered 2022-05-28: 2 g via INTRAVENOUS

## 2022-05-28 MED ORDER — CHLORHEXIDINE GLUCONATE CLOTH 2 % EX PADS
6.0000 | MEDICATED_PAD | Freq: Every day | CUTANEOUS | Status: DC
  Administered 2022-05-28 – 2022-06-05 (×8): 6 via TOPICAL

## 2022-05-28 MED ORDER — MIDAZOLAM HCL 2 MG/2ML IJ SOLN: 0.5000 mg | INTRAMUSCULAR | Status: DC | PRN

## 2022-05-28 MED ORDER — INSULIN ASPART 100 UNIT/ML IJ SOLN: 0.0000 [IU] | Freq: Four times a day (QID) | INTRAMUSCULAR | Status: DC

## 2022-05-28 NOTE — Progress Notes (Signed)
2 units PRBCs transfused. NO reactions or complications. Patient transferred to preop. All vitals within normal limits.

## 2022-05-28 NOTE — Op Note (Addendum)
[facility] Operative Note  05/28/22  Preoperative Diagnosis:  Pneumoperitoneum    Postoperative Diagnosis: Pneumoperitoneum, duodenal perforation    Procedure(s) Performed: Exploratory laparotomy, omental patch and drain placement, central line left jugular vein    Surgeon: Lanell Matar. Constance Haw, MD   Assistants: No qualified resident was available    Anesthesia: General endotracheal   Anesthesiologist: Denese Killings, MD    Specimens: Culture purulent ascites    Estimated Blood Loss: Minimal   Blood Replacement: None    Complications: None   Wound Class: Dirty infected    Operative Indications: Ms. Depasquale is a 72 yo with free air on CT in the setting of bleeding duodenal ulcers. She had a CTA that demonstrated this finding. We discussed exploration, risk of bleeding, infection, NPO status, intubation after surgery, death, and worsening renal disease requiring permanent dialysis.   Findings: Duodenal perforation, sealed, purulent ascites    Procedure: The patient was taken to the operating room and placed supine. General endotracheal anesthesia was induced. Intravenous antibiotics were  administered per protocol.   The left chest and neck was prepped and draped in the usual sterile fashion.  Wearing full gown and gloves, I performed the procedure.  An ultrasound was utilized to assess the jugular vein.  The needle with syringe was advanced into the vein with dark venous return, and a wire was placed using the Seldinger technique without difficulty.  Ectopia was noted and the wire was pulled back  The skin was knicked and a dilator was placed, and the three lumen catheter was placed over the wire with continued control of the wire.  There was good draw back of blood from all three lumens and each flushed easily with saline.  The catheter was secured in 4 points with 2-0 silk and a biopatch and dressing was placed.     A nasogastric tube positioned to  decompress the stomach. A foley was placed. The urine was cloudy and was sent for possible UTI. The abdomen was prepared and draped in the usual sterile fashion.   An upper midline incision was made and carried down to the fascia and  the abdomen was entered with care.  Upon entering the abdomen (organ space), I encountered purulent ascites. This was cloudy green and was sampled for culture. Suction was performed. The duodenum was noted to have fibrinous adherent material and an area of sealed perforation was noted at a site of more woody inflammation.  I did not want to disrupt this more, but investigated the other areas of the duodenum and stomach and could not find any other obvious sources of perforation.  The abdomen was suctioned and irrigated.   An omental patch was secured of the the perforation with 3-0 Silk suture in multiple places. A JP drain was brought through the RUQ and secured with a 3-0 Nylon and placed over the site of the repair. The NG was confirmed.    The midline was closed with 0 PDS. The skin and subcutaneous tissue were irrigated and closed with staples.   Final inspection revealed acceptable hemostasis. A honeycomb dressing was placed as well as drain sponge and tape. All counts were correct at the end of the case. The patient was taken to the ICU intubated.    Curlene Labrum, MD [facility] [address] [place], [place] [address] [phone] (office)

## 2022-05-28 NOTE — Progress Notes (Signed)
[facility] Progress Note  1 Day Post-Op  Subjective: Doing fair. JP with SS fluid. Extubated today. H&H stable.   Objective: Vital signs in last 24 hours: Temp:  [94.8 F (34.9 C)-98.4 F (36.9 C)] 97.6 F (36.4 C) (01/26 1200) Pulse Rate:  [68-99] 81 (01/26 1300) Resp:  [6-20] 6 (01/26 1300) BP: (81-171)/(33-55) 150/41 (01/26 1300) SpO2:  [94 %-100 %] 96 % (01/26 1300) Arterial Line BP: (147-215)/(39-62) 172/45 (01/26 1300) FiO2 (%):  [35 %-100 %] 35 % (01/26 1105) Weight:  [52.1 kg-57.2 kg] 57.2 kg (01/26 0502) Last BM Date : 05/27/22  Intake/Output from previous day: 01/25 0701 - 01/26 0700 In: 2046 [P.O.:240; I.V.:741.1; Blood:964; IV Piggyback:100.9] Out: 300 [Urine:100; Drains:170; Blood:20] Intake/Output this shift: Total I/O In: -  Out: 40 [Drains:40]  General appearance: alert and no distress GI: midline with honeycomb binder in place, Jp with SS fluid   Lab Results:  Recent Labs    05/27/22 0420 05/27/22 1831 05/28/22 0714 05/28/22 1208  WBC 24.3*  --  18.6*  --   HGB 8.0*   < > 10.5* 10.3*  HCT 24.6*   < > 31.7* 30.9*  PLT 203  --  147*  --    < > = values in this interval not displayed.   BMET Recent Labs    05/28/22 0714 05/28/22 0803  NA 135 135  K 3.4* 3.5  CL 101 101  CO2 24 23  GLUCOSE 98 97  BUN 22 21  CREATININE 1.76* 1.71*  CALCIUM 6.6* 6.6*   PT/INR No results for input(s): "LABPROT", "INR" in the last 72 hours.  Studies/Results: DG Chest Port 1 View  Result Date: 05/28/2022 CLINICAL DATA:  Respiratory failure EXAM: PORTABLE CHEST 1 VIEW COMPARISON:  05/25/2022 FINDINGS: Endotracheal tube is seen 3.5 cm above the carina. Nasogastric tube extends into the upper abdomen beyond the margin of the examination. Left internal jugular central venous catheter tip within the superior vena cava. Right internal jugular hemodialysis catheter tip noted within the right atrium. The lungs are symmetrically well expanded.  Partial left lower lobe collapse noted, however, with retrocardiac consolidation. Mild relative opacification the right hemithorax suggest the presence of a posteriorly layering pleural effusion on this supine examination. Superimposed trace perihilar interstitial pulmonary infiltrate is in keeping with trace probable interstitial pulmonary edema. No pneumothorax. Cardiac size is within normal limits. IMPRESSION: 1. Support lines and tubes in appropriate position. 2. Partial left lower lobe collapse. 3. Suspected posteriorly layering right pleural effusion. 4. Trace probable interstitial pulmonary edema. Electronically Signed   By: Fidela Salisbury M.D.   On: 05/28/2022 03:32   CT ANGIO GI BLEED  Result Date: 05/27/2022 CLINICAL DATA:  Recurrent gastrointestinal hemorrhage EXAM: CTA ABDOMEN AND PELVIS WITHOUT AND WITH CONTRAST TECHNIQUE: Multidetector CT imaging of the abdomen and pelvis was performed using the standard protocol during bolus administration of intravenous contrast. Multiplanar reconstructed images and MIPs were obtained and reviewed to evaluate the vascular anatomy. RADIATION DOSE REDUCTION: This exam was performed according to the departmental dose-optimization program which includes automated exposure control, adjustment of the mA and/or kV according to patient size and/or use of iterative reconstruction technique. CONTRAST:  32m OMNIPAQUE IOHEXOL 350 MG/ML SOLN COMPARISON:  None Available. FINDINGS: VASCULAR Aorta: Moderate mixed atherosclerotic plaque. No aortic aneurysm or dissection. No periaortic inflammatory change identified. Celiac: Patent without evidence of aneurysm, dissection, vasculitis or significant stenosis. SMA: Patent without evidence of aneurysm, dissection, vasculitis or significant stenosis. Renals: High-grade (near occlusion) stenosis  of the a left renal artery origin. Less than 50% stenosis of the right renal artery origin. Normal vascular morphology. Relatively diminutive  caliber of the left renal arterial vasculature distal the stenosis may relate to under filling. No aneurysm or dissection. IMA: Occluded at its origin with reconstitution via the marginal artery. 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 Focal irregular minimum 50% stenosis of the left common iliac artery secondary to a short segment dissection flap within the proximal left common iliac artery. Right internal iliac artery is heavily diseased and occluded at its origin with subsequent reconstitution. High-grade focal stenosis of the left internal iliac artery at its origin. Proximal Outflow: Bilateral common femoral and visualized portions of the superficial and profunda femoral arteries are patent without evidence of aneurysm, dissection, vasculitis or significant stenosis. Veins: Mesenteric, splenic, portal venous, and hepatic venous structures are patent. Iliofemoral veins are patent. Renal veins and inferior vena cava are patent. Review of the MIP images confirms the above findings. NON-VASCULAR Lower chest: Small left and moderate right pleural effusions are present with bibasilar compressive atelectasis. Hepatobiliary: Cholelithiasis without pericholecystic inflammatory change. Liver unremarkable. No intra or extrahepatic biliary ductal dilation. Pancreas: Unremarkable Spleen: Unremarkable Adrenals/Urinary Tract: The adrenal glands are unremarkable. Moderate left renal cortical atrophy. Right kidney is normal in size. 6 mm plaque-like nonobstructing calculus noted within the upper pole the right kidney. Multiple simple cortical cysts are seen within the right kidney. Hypodense 16 mm lesion is seen within the interpolar region of the right kidney demonstrates no significant enhancement is compatible with a Bosniak class 2 cyst. No follow-up imaging is recommended for these lesions. No hydronephrosis. The bladder is unremarkable. Stomach/Bowel: There is mild free  intraperitoneal gas present. The exact site of visceral perforation is not clearly identified on this examination, however, there is extraluminal gas and debris surrounding the first and proximal second portion of the duodenum raising the question of a perforation in this location. Endoscopic clip noted within the juncture of the first and second portion of the duodenum. Moderate free intraperitoneal fluid. Endoscopic clip laying dependently within the cecum. The stomach, small bowel, and large bowel are otherwise unremarkable. No active gastrointestinal hemorrhage identified. Fluid within the descending colon and rectal vault may present clinically as diarrhea. Lymphatic: No pathologic adenopathy within the abdomen and pelvis. Reproductive: Involuted fibroid within the uterus. The pelvic organs are otherwise unremarkable. Other: Moderate subcutaneous body wall edema noted dependently. Musculoskeletal: No acute bone abnormality. No lytic or blastic bone lesion. IMPRESSION: VASCULAR . 1. 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 2. 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 Focal irregular minimum 50% stenosis of the left common iliac artery secondary to a short segment dissection flap. 3. 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 High-grade focal stenosis of the left internal iliac artery at its origin. 4. 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 NON-VASCULAR . 1. Moderate free intraperitoneal gas and fluid in keeping with visceral perforation. The exact site is not clearly identified on this examination, however, more focal debris and gas adjacent to the juncture of the first and second portion of the duodenum raises the question of a perforation in this location. 2. No active gastrointestinal  hemorrhage identified 3. Cholelithiasis. 4. Mild right nonobstructing nephrolithiasis. 5. Fluid within the descending colon  and rectal vault may present clinically as diarrhea. Aortic Atherosclerosis (ICD10-I70.0). Electronically Signed   By: Fidela Salisbury M.D.   On: 05/27/2022 21:51    Anti-infectives: Anti-infectives (From admission, onward)    Start     Dose/Rate Route Frequency Ordered Stop   05/28/22 0400  piperacillin-tazobactam (ZOSYN) IVPB 2.25 g        2.25 g 100 mL/hr over 30 Minutes Intravenous Every 8 hours 05/28/22 0241 06/02/22 0559   05/28/22 0330  fluconazole (DIFLUCAN) IVPB 200 mg        200 mg 100 mL/hr over 60 Minutes Intravenous Every 24 hours 05/28/22 0241     05/28/22 0049  sodium chloride 0.9 % with cefoTEtan (CEFOTAN) ADS Med       Note to Pharmacy: Donnamarie Rossetti S: cabinet override      05/28/22 0049 05/28/22 0057   05/24/22 1230  ceFAZolin (ANCEF) IVPB 2g/100 mL premix        2 g 200 mL/hr over 30 Minutes Intravenous  Once 05/24/22 0916 05/24/22 1227   05/24/22 0920  ceFAZolin (ANCEF) 2-4 GM/100ML-% IVPB       Note to Pharmacy: Bonnielee Haff C: cabinet override      05/24/22 0920 05/24/22 2129   05/17/22 0800  ceFEPIme (MAXIPIME) 1 g in sodium chloride 0.9 % 100 mL IVPB  Status:  Discontinued        1 g 200 mL/hr over 30 Minutes Intravenous Every 24 hours 05/16/22 0641 05/20/22 1012   05/16/22 0641  vancomycin variable dose per unstable renal function (pharmacist dosing)  Status:  Discontinued         Does not apply See admin instructions 05/16/22 0641 05/17/22 1605   05/16/22 0445  vancomycin (VANCOREADY) IVPB 1250 mg/250 mL        1,250 mg 166.7 mL/hr over 90 Minutes Intravenous  Once 05/16/22 0444 05/16/22 0732   05/16/22 0430  ceFEPIme (MAXIPIME) 2 g in sodium chloride 0.9 % 100 mL IVPB        2 g 200 mL/hr over 30 Minutes Intravenous  Once 05/16/22 0426 05/16/22 0534       Assessment/Plan: Patient s/p EXPLORATORY LAPAROTOMY, OMENTAL PATH, DRAIN  PLACEMENT INSERTION CENTRAL LINE ADULT for perforated duodenal ulcer. Doing well overall. Extubated.  Npo, NG Zosyn and diflucan for contamination intraabdominal for 5 days  H&H stable UGI 1/31 TPN now Discussed with Dr. Carles Collet.    LOS: 12 days    Virl Cagey 05/28/2022

## 2022-05-28 NOTE — Transfer of Care (Signed)
Immediate Anesthesia Transfer of Care Note  Patient: Sheri Cooper  Procedure(s) Performed: EXPLORATORY LAPAROTOMY, OMENTAL PATH, DRAIN PLACEMENT (Abdomen)  Patient Location: ICU  Anesthesia Type:General  Level of Consciousness: Patient remains intubated per anesthesia plan  Airway & Oxygen Therapy: Patient remains intubated per anesthesia plan  Post-op Assessment: Report given to RN and Post -op Vital signs reviewed and stable  Post vital signs: Reviewed and stable  Last Vitals:  Vitals Value Taken Time  BP 146/68 05/28/22 0233  Temp 95.5    05/28/22 0230  Pulse 85 05/28/22 0235  Resp 18 05/28/22 0235  SpO2 100 % 05/28/22 0235  Vitals shown include unvalidated device data.  Last Pain:  Vitals:   05/27/22 2354  TempSrc: Oral  PainSc:       Patients Stated Pain Goal: 0 (35/24/81 8590)  Complications: No notable events documented.

## 2022-05-28 NOTE — Procedures (Signed)
Extubation Procedure Note Patients mouth and ETT suctioned prior to extubation Patient Details:   Name: Sheri Cooper DOB: Jul 15, 1950 MRN: [id number]   Airway Documentation:    Vent end date: 05/28/22 Vent end time: 1146   Evaluation  O2 sats: stable throughout Complications: No apparent complications Patient did tolerate procedure well. Bilateral Breath Sounds: Diminished   Yes, patient able to speak She was placed on 3 L/m [place] post extubation.  Pete Pelt 05/28/2022, 11:53 AM

## 2022-05-28 NOTE — Anesthesia Postprocedure Evaluation (Signed)
Anesthesia Post Note  Patient: Sheri Cooper  Procedure(s) Performed: EXPLORATORY LAPAROTOMY, OMENTAL PATH, DRAIN PLACEMENT (Abdomen)  Patient location during evaluation: ICU Anesthesia Type: General Level of consciousness: patient remains intubated per anesthesia plan Pain management: pain level controlled Vital Signs Assessment: post-procedure vital signs reviewed and stable Respiratory status: patient remains intubated per anesthesia plan Cardiovascular status: blood pressure returned to baseline and stable Postop Assessment: no apparent nausea or vomiting Anesthetic complications: no  No notable events documented.   Last Vitals:  Vitals:   05/28/22 0000 05/28/22 0225  BP: (!) 153/48   Pulse: 94   Resp: 13   Temp:    SpO2: 95% 100%    Last Pain:  Vitals:   05/27/22 2354  TempSrc: Oral  PainSc:                  Astaria Nanez C Chinwe Lope

## 2022-05-28 NOTE — Progress Notes (Signed)
PROGRESS NOTE  Sheri Cooper FAO:[id number] DOB: Sep 08, 1950 DOA: 05/16/2022 PCP: [facility]  Brief History:   72 y.o. female with medical history significant of COPD, rheumatoid arthritis, stage V CKD. Admitted on 05/16/2022 with healthcare associated pneumonia and worsening renal function with anion gap metabolic acidosis.  -Patient was recently discharged from [facility] on 05/12/2022 after treatment for COPD.  Nephrology was consulted.  Her hospitalization was prolonged secondary to HCAP and progressive renal failure.  She finished intravenous cefepime on 05/20/2022.  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  She was started on steroids by nephrology.  Pt was started on hemodialysis on 05/18/22 after temporary HD cath placement and had renal biopsy done on 05/19/22.  On 05/24/2022, tunneled dialysis catheter was placed.  An outpatient dialysis center was ultimately found for the patient.  Her first outpatient dialysis will be scheduled on 06/01/2022. Her hospitalization was prolonged secondary to GI bleed.  She was noted to have melena and hematochezia.  She was transfused 3 units total for the hospitalization.  GI was consulted.  On 05/22/2022, the patient underwent EGD and colonoscopy.  Results are discussed below.  Her hemoglobin stabilized.   On the evening of 05/27/22, pt had another large maroon stool.  Pt remains hemodynamically stable.  Case was discussed with GI, Dr. Jenetta Downer.  Case also discussed with nephrology.  It was felt that the benefit was greater than the risk of obtaining CTA of the abdomen and pelvis.  CTA showed intraperitoneal gas and fluid concerning for visceral perforation.  Based upon the location it was felt that it was likely coming from the first-second portion of the duodenum.  There is no acute bleeding.  General surgery was consulted.  Dr. Constance Haw took the patient to the OR for exploratory laparotomy.  Omental patch repair  was done.  The patient was left intubated overnight.  Assessment/Plan:  AKI on CKD 3b -ANCA, ANA, anti-GBM neg, C3/C4 WNL. HIV, Hep B neg. FLC within acceptable limits. SPEP pending  -Anion gap metabolic acidosis noted and sodium bicarbonate -patient sees Dr. Theador Hawthorne as outpatient---discussed with Dr. Lars Masson was concerned that patient would need hemodialysis this admission which has now been started -Care Everywhere records from [facility] reviewed -appreciate [facility] follow up -first HD 05/18/22 -Right IJ HD catheter placed by Dr. Okey Dupre; IR for [facility] being arranged by nephrology -completed IR renal biopsy on 05/19/2022 at [facility] -renally adjust medications, avoid nephrotoxic agents / dehydration  / hypotension -Tunneled HD cath 05/24/22 -renal biopsy with FSGS: tip variant with mild-mod IF/TA.   -continue steroids for now>> convert IV Solu-Medrol until able to take p.o.  Pneumoperitoneum/Peritonitis -Noted on CTA abdomen 05/27/2022 -Location likely in the first-second portion of the duodenum -05/27/22 Exploratory laparotomy with omental patch -Remain n.p.o. and start TPN per general surgery -Convert essential medications to IV if possible -continue zosyn and fluconazole  Acute respiratory failure with hypoxia -Patient left intubated postoperatively -Consult pulmonary for management -wean to extubation  Guaiac Positive Stools / GI bleeding/ ABLA Bleeding duodenal ulcers s/p clip x1, epinephrine and coagulation treatments Esophagitis and Gastritis  -large bloody BM noted by RN guaiac positive on 1/18 -DC'd subcutaneous heparin on 1/18 -transfused 1 unit PRBC with HD on 1/18, Hg down again to 6.1, transfused 2 unit PRBC 1/19 with HD  -GI team consulted to eval 1/19 -transfused 2 units PRBC with HD 1/19, Hg up to 9.8 -EGD / Colonoscopy planned  for 05/22/22 with Dr. Jenetta Downer - with findings of grade C esophagitis and gastritis with ulcers, multiple duodenal ulcers  with  adherent blood clots, bleeding vessel clip x 1, epinephrine injection given and APC; however second vessel could not be clipped. Currently on PP  -initially finished 72 hours IV pantoprazole drip>>IV bid -05/22/21 colonoscopy-blood in entire colon  -05/27/22--had another large maroon stool -05/27/2022 CT abdomen--no active GI bleed, but pneumoperitoneum found -GI reconsulted -Transfused 3 units PRBC (6 units total for admission) 1/25-1/26   COPD  -- stable  -continue bronchodilators -Pt is on high dose steroids per nephrology for AKI   Hypokalemia -repleted orally  -recheck in AM   Thrombocytopenia - resolved now - we stopped [place] heparin on 1/18 - checking HIT antibody labs - pending results - platelets improved to 145 after stopping all heparins  - "pharmacy on HIT alert" order placed   Incidental finding of multiple renal lesions CT  of chest showed multiple renal lesions bilaterally, incompletely characterized on today's noncontrast CT examination, including an indeterminate 1.9 cm lesion in the anterior aspect of the interpolar region of the left kidney.  Follow-up nonemergent outpatient abdominal MRI with and without IV gadolinium is recommended in the near future to definitively evaluate this lesion and exclude neoplasm   Elevated BNP--echo from 05/10/2022 at [facility] available in [place] reveals-  The left ventricle is normal in size with normal wall thickness. The left ventricular systolic function is normal, LVEF is visually estimated at 60-65%.  There is normal left ventricular diastolic function.  -There is mild to moderate pulmonary hypertension no significant valvular abnormalities -hemodialysis to manage volume status   Diarrhea - resolved now;  - c diff testing was negative        Family Communication:   brother updated  Consultants:  GI, renal, general surgery  Code Status:  FULL   DVT Prophylaxis:  [place] Heparin / [place]  Lovenox   Procedures: As Listed in Progress Note Above  Antibiotics: Zosyn 1/26>> Fluconzole 1/26>>      Subjective: Intubated on vent.  Awake.  Denies cp, sob, n/v/d.    Objective: Vitals:   05/28/22 0430 05/28/22 0502 05/28/22 0600 05/28/22 0630  BP:   (!) 141/47   Pulse: 70  70 68  Resp: '18  18 18  '$ Temp:  (!) 96.1 F (35.6 C)    TempSrc:  Rectal    SpO2: 99%  99% 99%  Weight:  57.2 kg    Height:        Intake/Output Summary (Last 24 hours) at 05/28/2022 0725 Last data filed at 05/28/2022 0600 Gross per 24 hour  Intake 2045.99 ml  Output 300 ml  Net 1745.99 ml   Weight change: -0.1 kg Exam:  General:  Pt is alert, follows commands appropriately, not in acute distress; intubated HEENT: No icterus, No thrush, No neck mass, [place]/AT Cardiovascular: RRR, S1/S2, no rubs, no gallops Respiratory: bibasilar rales. No wheeze Abdomen: Soft/decreased BS, +tender, non distended, no guarding Extremities: No edema, No lymphangitis, No petechiae, No rashes, no synovitis   Data Reviewed: I have personally reviewed following labs and imaging studies Basic Metabolic Panel: Recent Labs  Lab 05/22/22 1039 05/22/22 1440 05/23/22 0414 05/24/22 0439 05/25/22 0404 05/26/22 0410 05/27/22 0420  NA  --   --  135 133* 134* 135 136  K  --    < > 3.6 2.9* 3.5 3.6 4.5  CL  --   --  101 99 100 99 99  CO2  --   --  '24 24 23 24 26  '$ GLUCOSE  --   --  108* 78 70 64* 94  BUN  --   --  69* 35* 37* 19 33*  CREATININE  --   --  3.54* 2.54* 3.10* 2.00* 2.98*  CALCIUM  --   --  7.4* 7.1* 7.2* 7.1* 7.4*  MG 1.7  --  2.2 2.0  --   --   --   PHOS  --   --  4.5 2.8 3.5 2.5 2.6   < > = values in this interval not displayed.   Liver Function Tests: Recent Labs  Lab 05/23/22 0414 05/24/22 0439 05/25/22 0404 05/26/22 0410 05/27/22 0420  ALBUMIN 2.1* 2.1* 2.1* 1.9* 1.9*   Recent Labs  Lab 05/25/22 0404  LIPASE 41   No results for input(s): "AMMONIA" in the last 168  hours. Coagulation Profile: No results for input(s): "INR", "PROTIME" in the last 168 hours. CBC: Recent Labs  Lab 05/23/22 0414 05/24/22 0439 05/25/22 0404 05/26/22 0410 05/27/22 0420 05/27/22 1831 05/27/22 2350  WBC 20.1* 29.7* 20.4* 28.8* 24.3*  --   --   HGB 8.7* 8.2* 9.1* 8.4* 8.0* 4.3* 9.5*  HCT 25.4* 24.3* 27.9* 26.0* 24.6* 13.4* 28.4*  MCV 82.7 85.9 86.9 88.1 86.6  --   --   PLT 145* 140* 146* 165 203  --   --    Cardiac Enzymes: No results for input(s): "CKTOTAL", "CKMB", "CKMBINDEX", "TROPONINI" in the last 168 hours. BNP: Invalid input(s): "POCBNP" CBG: Recent Labs  Lab 05/26/22 0734 05/26/22 0819 05/26/22 1111 05/28/22 0519  GLUCAP 50* 95 107* 127*   HbA1C: No results for input(s): "HGBA1C" in the last 72 hours. Urine analysis:    Component Value Date/Time   COLORURINE YELLOW 05/28/2022 0131   APPEARANCEUR HAZY (A) 05/28/2022 0131   LABSPEC 1.016 05/28/2022 0131   PHURINE 7.0 05/28/2022 0131   GLUCOSEU NEGATIVE 05/28/2022 0131   HGBUR SMALL (A) 05/28/2022 0131   BILIRUBINUR NEGATIVE 05/28/2022 0131   KETONESUR NEGATIVE 05/28/2022 0131   PROTEINUR >=300 (A) 05/28/2022 0131   NITRITE NEGATIVE 05/28/2022 0131   LEUKOCYTESUR SMALL (A) 05/28/2022 0131   Sepsis Labs: '@LABRCNTIP'$ (procalcitonin:4,lacticidven:4) )No results found for this or any previous visit (from the past 240 hour(s)).   Scheduled Meds:  calcium acetate  667 mg Oral TID WC   Chlorhexidine Gluconate Cloth  6 each Topical Daily   [START ON 06/01/2022] darbepoetin (ARANESP) injection - DIALYSIS  200 mcg Subcutaneous Q Tue-1800   dextromethorphan-guaiFENesin  1 tablet Oral BID   pantoprazole  40 mg Intravenous Q12H   predniSONE  60 mg Oral Q breakfast   Continuous Infusions:  albumin human     fluconazole (DIFLUCAN) IV 100 mL/hr at 05/28/22 0313   piperacillin-tazobactam (ZOSYN)  IV 2.25 g (05/28/22 0551)    Procedures/Studies: DG Chest Port 1 View  Result Date:  05/28/2022 CLINICAL DATA:  Respiratory failure EXAM: PORTABLE CHEST 1 VIEW COMPARISON:  05/25/2022 FINDINGS: Endotracheal tube is seen 3.5 cm above the carina. Nasogastric tube extends into the upper abdomen beyond the margin of the examination. Left internal jugular central venous catheter tip within the superior vena cava. Right internal jugular hemodialysis catheter tip noted within the right atrium. The lungs are symmetrically well expanded. Partial left lower lobe collapse noted, however, with retrocardiac consolidation. Mild relative opacification the right hemithorax suggest the presence of a posteriorly layering pleural effusion on this supine examination. Superimposed trace perihilar interstitial pulmonary  infiltrate is in keeping with trace probable interstitial pulmonary edema. No pneumothorax. Cardiac size is within normal limits. IMPRESSION: 1. Support lines and tubes in appropriate position. 2. Partial left lower lobe collapse. 3. Suspected posteriorly layering right pleural effusion. 4. Trace probable interstitial pulmonary edema. Electronically Signed   By: Fidela Salisbury M.D.   On: 05/28/2022 03:32   CT ANGIO GI BLEED  Result Date: 05/27/2022 CLINICAL DATA:  Recurrent gastrointestinal hemorrhage EXAM: CTA ABDOMEN AND PELVIS WITHOUT AND WITH CONTRAST TECHNIQUE: Multidetector CT imaging of the abdomen and pelvis was performed using the standard protocol during bolus administration of intravenous contrast. Multiplanar reconstructed images and MIPs were obtained and reviewed to evaluate the vascular anatomy. RADIATION DOSE REDUCTION: This exam was performed according to the departmental dose-optimization program which includes automated exposure control, adjustment of the mA and/or kV according to patient size and/or use of iterative reconstruction technique. CONTRAST:  53m OMNIPAQUE IOHEXOL 350 MG/ML SOLN COMPARISON:  None Available. FINDINGS: VASCULAR Aorta: Moderate mixed atherosclerotic plaque.  No aortic aneurysm or dissection. No periaortic inflammatory change identified. Celiac: Patent without evidence of aneurysm, dissection, vasculitis or significant stenosis. SMA: Patent without evidence of aneurysm, dissection, vasculitis or significant stenosis. Renals: High-grade (near occlusion) stenosis of the a left renal artery origin. Less than 50% stenosis of the right renal artery origin. Normal vascular morphology. Relatively diminutive caliber of the left renal arterial vasculature distal the stenosis may relate to under filling. No aneurysm or dissection. IMA: Occluded at its origin with reconstitution via the marginal artery. 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 Focal irregular minimum 50% stenosis of the left common iliac artery secondary to a short segment dissection flap within the proximal left common iliac artery. Right internal iliac artery is heavily diseased and occluded at its origin with subsequent reconstitution. High-grade focal stenosis of the left internal iliac artery at its origin. Proximal Outflow: Bilateral common femoral and visualized portions of the superficial and profunda femoral arteries are patent without evidence of aneurysm, dissection, vasculitis or significant stenosis. Veins: Mesenteric, splenic, portal venous, and hepatic venous structures are patent. Iliofemoral veins are patent. Renal veins and inferior vena cava are patent. Review of the MIP images confirms the above findings. NON-VASCULAR Lower chest: Small left and moderate right pleural effusions are present with bibasilar compressive atelectasis. Hepatobiliary: Cholelithiasis without pericholecystic inflammatory change. Liver unremarkable. No intra or extrahepatic biliary ductal dilation. Pancreas: Unremarkable Spleen: Unremarkable Adrenals/Urinary Tract: The adrenal glands are unremarkable. Moderate left renal cortical atrophy. Right kidney is normal in size. 6 mm  plaque-like nonobstructing calculus noted within the upper pole the right kidney. Multiple simple cortical cysts are seen within the right kidney. Hypodense 16 mm lesion is seen within the interpolar region of the right kidney demonstrates no significant enhancement is compatible with a Bosniak class 2 cyst. No follow-up imaging is recommended for these lesions. No hydronephrosis. The bladder is unremarkable. Stomach/Bowel: There is mild free intraperitoneal gas present. The exact site of visceral perforation is not clearly identified on this examination, however, there is extraluminal gas and debris surrounding the first and proximal second portion of the duodenum raising the question of a perforation in this location. Endoscopic clip noted within the juncture of the first and second portion of the duodenum. Moderate free intraperitoneal fluid. Endoscopic clip laying dependently within the cecum. The stomach, small bowel, and large bowel are otherwise unremarkable. No active gastrointestinal hemorrhage identified. Fluid within the descending colon and rectal vault may  present clinically as diarrhea. Lymphatic: No pathologic adenopathy within the abdomen and pelvis. Reproductive: Involuted fibroid within the uterus. The pelvic organs are otherwise unremarkable. Other: Moderate subcutaneous body wall edema noted dependently. Musculoskeletal: No acute bone abnormality. No lytic or blastic bone lesion. IMPRESSION: VASCULAR . 1. 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 2. 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 Focal irregular minimum 50% stenosis of the left common iliac artery secondary to a short segment dissection flap. 3. 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 High-grade focal stenosis of the left internal iliac artery at its origin. 4. 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 NON-VASCULAR . 1. Moderate free intraperitoneal gas and fluid in keeping with visceral perforation. The exact site is not clearly identified on this examination, however, more focal debris and gas adjacent to the juncture of the first and second portion of the duodenum raises the question of a perforation in this location. 2. No active gastrointestinal hemorrhage identified 3. Cholelithiasis. 4. Mild right nonobstructing nephrolithiasis. 5. Fluid within the descending colon and rectal vault may present clinically as diarrhea. Aortic Atherosclerosis (ICD10-I70.0). Electronically Signed   By: Fidela Salisbury M.D.   On: 05/27/2022 21:51   DG CHEST PORT 1 VIEW  Result Date: 05/25/2022 CLINICAL DATA:  Chest pain EXAM: PORTABLE CHEST 1 VIEW COMPARISON:  05/22/2022 FINDINGS: Right internal jugular approach hemodialysis catheter terminates at the level of the right atrium. Stable heart size. Small right pleural effusion with associated right basilar opacity, similar to prior. Similar prominent interstitial markings bilaterally. No pneumothorax. IMPRESSION: 1. Small right pleural effusion with associated right basilar opacity, similar to prior. 2. Similar prominent interstitial markings bilaterally, likely mild edema. Electronically Signed   By: Davina Poke D.O.   On: 05/25/2022 11:39   DG Abd 1 View  Result Date: 05/25/2022 CLINICAL DATA:  Abdominal pain EXAM: ABDOMEN - 1 VIEW COMPARISON:  05/22/2022 FINDINGS: The bowel gas pattern is normal. No radio-opaque calculi or other significant radiographic abnormality are seen. Surgical clips overlie the pelvis and right upper quadrant. Calcification consistent with fibroid noted. IMPRESSION: Unremarkable bowel gas pattern. Calcified fibroid incidentally noted Electronically Signed   By: Sammie Bench M.D.   On: 05/25/2022 08:22   IR Fluoro Guide CV Line Right  Result Date:  05/24/2022 INDICATION: 72 year old female in need of durable dialysis access. She presents for removal temporary dialysis catheter in placement of a tunneled dialysis catheter. EXAM: TUNNELED CENTRAL VENOUS HEMODIALYSIS CATHETER PLACEMENT WITH ULTRASOUND AND FLUOROSCOPIC GUIDANCE MEDICATIONS: 2 g Ancef. The antibiotic was given in an appropriate time interval prior to skin puncture. ANESTHESIA/SEDATION: Moderate (conscious) sedation was employed during this procedure. A total of Versed 1 mg and Fentanyl 25 mcg was administered intravenously. Moderate Sedation Time: 11 minutes. The patient's level of consciousness and vital signs were monitored continuously by radiology nursing throughout the procedure under my direct supervision. FLUOROSCOPY TIME:  Radiation exposure index: 1 mGy reference air kerma COMPLICATIONS: None immediate. PROCEDURE: Informed written consent was obtained from the patient after a discussion of the risks, benefits, and alternatives to treatment. Questions regarding the procedure were encouraged and answered. The right neck and chest were prepped with chlorhexidine in a sterile fashion, and a sterile drape was applied covering the operative field. Maximum barrier sterile technique with sterile gowns and gloves were used for  the procedure. A timeout was performed prior to the initiation of the procedure. After creating a small venotomy incision, a micropuncture kit was utilized to access the right internal jugular vein under direct, real-time ultrasound guidance after the overlying soft tissues were anesthetized with 1% lidocaine with epinephrine. Ultrasound image documentation was performed. The microwire was kinked to measure appropriate catheter length. A stiff Glidewire was advanced to the level of the IVC and the micropuncture sheath was exchanged for a peel-away sheath. A palindrome tunneled hemodialysis catheter measuring 19 cm from tip to cuff was tunneled in a retrograde fashion from the  anterior chest wall to the venotomy incision. The catheter was then placed through the peel-away sheath with tips ultimately positioned within the superior aspect of the right atrium. Final catheter positioning was confirmed and documented with a spot radiographic image. The catheter aspirates and flushes normally. The catheter was flushed with appropriate volume heparin dwells. The catheter exit site was secured with a 0-Prolene retention suture. The venotomy incision was closed with an interrupted 4-0 Vicryl, Dermabond and Steri-strips. Dressings were applied. The patient tolerated the procedure well without immediate post procedural complication. IMPRESSION: Successful placement of 19 cm tip to cuff tunneled hemodialysis catheter via the right internal jugular vein with tips terminating within the superior aspect of the right atrium. The catheter is ready for immediate use. Electronically Signed   By: Jacqulynn Cadet M.D.   On: 05/24/2022 12:39   IR US Guide Vasc Access Right  Result Date: 05/24/2022 INDICATION: 72 year old female in need of durable dialysis access. She presents for removal temporary dialysis catheter in placement of a tunneled dialysis catheter. EXAM: TUNNELED CENTRAL VENOUS HEMODIALYSIS CATHETER PLACEMENT WITH ULTRASOUND AND FLUOROSCOPIC GUIDANCE MEDICATIONS: 2 g Ancef. The antibiotic was given in an appropriate time interval prior to skin puncture. ANESTHESIA/SEDATION: Moderate (conscious) sedation was employed during this procedure. A total of Versed 1 mg and Fentanyl 25 mcg was administered intravenously. Moderate Sedation Time: 11 minutes. The patient's level of consciousness and vital signs were monitored continuously by radiology nursing throughout the procedure under my direct supervision. FLUOROSCOPY TIME:  Radiation exposure index: 1 mGy reference air kerma COMPLICATIONS: None immediate. PROCEDURE: Informed written consent was obtained from the patient after a discussion of the  risks, benefits, and alternatives to treatment. Questions regarding the procedure were encouraged and answered. The right neck and chest were prepped with chlorhexidine in a sterile fashion, and a sterile drape was applied covering the operative field. Maximum barrier sterile technique with sterile gowns and gloves were used for the procedure. A timeout was performed prior to the initiation of the procedure. After creating a small venotomy incision, a micropuncture kit was utilized to access the right internal jugular vein under direct, real-time ultrasound guidance after the overlying soft tissues were anesthetized with 1% lidocaine with epinephrine. Ultrasound image documentation was performed. The microwire was kinked to measure appropriate catheter length. A stiff Glidewire was advanced to the level of the IVC and the micropuncture sheath was exchanged for a peel-away sheath. A palindrome tunneled hemodialysis catheter measuring 19 cm from tip to cuff was tunneled in a retrograde fashion from the anterior chest wall to the venotomy incision. The catheter was then placed through the peel-away sheath with tips ultimately positioned within the superior aspect of the right atrium. Final catheter positioning was confirmed and documented with a spot radiographic image. The catheter aspirates and flushes normally. The catheter was flushed with appropriate volume heparin dwells. The catheter exit site was  secured with a 0-Prolene retention suture. The venotomy incision was closed with an interrupted 4-0 Vicryl, Dermabond and Steri-strips. Dressings were applied. The patient tolerated the procedure well without immediate post procedural complication. IMPRESSION: Successful placement of 19 cm tip to cuff tunneled hemodialysis catheter via the right internal jugular vein with tips terminating within the superior aspect of the right atrium. The catheter is ready for immediate use. Electronically Signed   By: Jacqulynn Cadet M.D.   On: 05/24/2022 12:39   DG CHEST PORT 1 VIEW  Result Date: 05/22/2022 CLINICAL DATA:  Evaluate for intra-abdominal free air. EXAM: PORTABLE CHEST 1 VIEW COMPARISON:  Abdominal radiograph 05/22/2022 FINDINGS: Central venous catheter tip projects over the superior vena cava. Stable cardiac and mediastinal contours. Small layering right pleural effusion with underlying consolidation. No pneumothorax. No definite free air no definite gas identified underlying the hemidiaphragms. IMPRESSION: 1. Small layering right pleural effusion with underlying consolidation. 2. No definite free air identified underlying the hemidiaphragms. Consider further evaluation with decubitus imaging of the abdomen if there is persistent clinical concern given the limitations of this portable semi-erect chest radiograph. Electronically Signed   By: Lovey Newcomer M.D.   On: 05/22/2022 16:02   DG Abd 1 View  Result Date: 05/22/2022 CLINICAL DATA:  Postoperative abdominal pain. EXAM: ABDOMEN - 1 VIEW COMPARISON:  None Available. FINDINGS: Gas is demonstrated within nondilated loops of large and small bowel in a nonobstructed pattern. Supine evaluation limited for the detection of free intraperitoneal air. Lumbar spine degenerative changes. Calcific density projecting over the pelvis, nonspecific, potentially calcified uterine fibroid. IMPRESSION: Nonobstructed bowel-gas pattern. Electronically Signed   By: Lovey Newcomer M.D.   On: 05/22/2022 14:08   DG CHEST PORT 1 VIEW  Result Date: 05/21/2022 CLINICAL DATA:  Leukocytosis EXAM: PORTABLE CHEST 1 VIEW COMPARISON:  05/18/2022 FINDINGS: Central venous line tip in distal SVC. Normal cardiac silhouette. Small RIGHT effusion. Mild venous congestion. No pneumothorax. No focal consolidation. Lymphadenectomy clips in the LEFT axilla. No significant interval change. IMPRESSION: 1. No significant interval change. 2. Small RIGHT effusion. 3. Mild venous congestion. Electronically  Signed   By: Suzy Bouchard M.D.   On: 05/21/2022 08:22   US BIOPSY (KIDNEY)  Result Date: 05/19/2022 INDICATION: 71 year old female with history of end-stage renal disease. EXAM: ULTRASOUND GUIDED RENAL BIOPSY COMPARISON:  None Available. MEDICATIONS: None. ANESTHESIA/SEDATION: Fentanyl 25 mcg IV; Versed 0.5 mg IV Total Moderate Sedation time: 10 minutes; The patient was continuously monitored during the procedure by the interventional radiology nurse under my direct supervision. COMPLICATIONS: None immediate. PROCEDURE: Informed written consent was obtained from the patient after a discussion of the risks, benefits and alternatives to treatment. The patient understands and consents the procedure. A timeout was performed prior to the initiation of the procedure. Ultrasound scanning was performed of the bilateral flanks. The inferior pole of the left kidney was selected for biopsy due to location and sonographic window. The procedure was planned. The operative site was prepped and draped in the usual sterile fashion. The overlying soft tissues were anesthetized with 1% lidocaine with epinephrine. A 17 gauge coaxial introducer needle was advanced into the inferior cortex of the left kidney and 2 core biopsies were obtained under direct ultrasound guidance with a 16 gauge core biopsy device. Images were saved for documentation purposes. The biopsy device was removed and hemostasis was obtained with manual compression after injection of Gel-Foam slurry along the needle track under ultrasound guidance. Post procedural scanning was negative for significant post  procedural hemorrhage or additional complication. A dressing was placed. The patient tolerated the procedure well without immediate post procedural complication. IMPRESSION: Technically successful ultrasound guided left renal biopsy. Ruthann Cancer, MD [facility] Electronically Signed   By: Ruthann Cancer M.D.   On: 05/19/2022 14:19   DG Chest Port 1 View  Result Date: 05/18/2022 CLINICAL DATA:  Central line placement. EXAM: PORTABLE CHEST 1 VIEW COMPARISON:  CT chest and chest x-ray from 2 days ago. FINDINGS: New right internal jugular central venous catheter with tip at the cavoatrial junction. The heart size and mediastinal contours are within normal limits. Mild diffuse interstitial thickening similar. Decreasing small right and unchanged small left pleural effusions. Increasing asymmetric density in the peripheral left upper lobe. Similar atelectasis in the peripheral right middle lobe. Improving right lower lobe atelectasis. No pneumothorax. No acute osseous abnormality. IMPRESSION: 1. New right internal jugular central venous catheter with tip at the cavoatrial junction. No pneumothorax. 2. Similar diffuse interstitial thickening with increasing asymmetric density in the peripheral left upper lobe. Differential considerations include infection or pulmonary edema. 3. Decreased small right and unchanged small left pleural effusions. Electronically Signed   By: Titus Dubin M.D.   On: 05/18/2022 14:06   US RENAL  Result Date: 05/18/2022 CLINICAL DATA:  Chronic kidney disease EXAM: RENAL / URINARY TRACT ULTRASOUND COMPLETE COMPARISON:  Abdomen CT report 01/13/2018 FINDINGS: Right Kidney: Renal measurements: 8.9 x 4.7 x 4.4 = volume: 97 mL. Kidney appears echogenic. No collecting system dilatation. There is a simple cyst along the midportion of the kidney which is anechoic with through transmission measuring 17 mm. Left Kidney: Renal measurements: 7.5 x 3.9 x 3.2 = volume: 49 mL. Small echogenic appearing kidney. No collecting system dilatation. Anechoic rounded structure seen in the mid kidney measuring 2 cm. Through transmission is smoothly marginated. Bladder: Appears normal for degree of bladder distention. Other: Scattered ascites.  Probable right-sided pleural effusion. IMPRESSION: Echogenic  small appearing kidneys. No collecting system dilatation. Bilateral simple appearing renal cysts. Ascites.  Right pleural effusion Electronically Signed   By: Jill Side M.D.   On: 05/18/2022 11:07   CT Chest Wo Contrast  Result Date: 05/16/2022 CLINICAL DATA:  72 year old female with history of suspected pneumonia. EXAM: CT CHEST WITHOUT CONTRAST TECHNIQUE: Multidetector CT imaging of the chest was performed following the standard protocol without IV contrast. RADIATION DOSE REDUCTION: This exam was performed according to the departmental dose-optimization program which includes automated exposure control, adjustment of the mA and/or kV according to patient size and/or use of iterative reconstruction technique. COMPARISON:  Chest x-ray 05/16/2022. FINDINGS: Cardiovascular: Heart size is normal. There is no significant pericardial fluid, thickening or pericardial calcification. Aortic atherosclerosis. No definite coronary artery calcifications. Mild calcifications of the aortic valve. Mediastinum/Nodes: No pathologically enlarged mediastinal or hilar lymph nodes. Please note that accurate exclusion of hilar adenopathy is limited on noncontrast CT scans. Esophagus is unremarkable in appearance. No axillary lymphadenopathy. Lungs/Pleura: Moderate to large right and small to moderate left pleural effusions lying dependently associated with areas of passive subsegmental atelectasis in the lower lobes of the lungs bilaterally. In addition, there are mid to upper lung predominant patchy areas of ground-glass attenuation and nodular appearing consolidative airspace disease, likely to reflect multilobar bilateral bronchopneumonia. The largest of these nodular appearing regions in the apex of the left upper lobe (axial image 32 of series 4) measuring 1.3 x 1.2 cm. Upper Abdomen: Aortic atherosclerosis. Tiny partially calcified gallstones lying  dependently in the fundus of the gallbladder. Small volume of ascites.  Multiple renal lesions bilaterally, incompletely characterized on today's noncontrast CT examination, most notable for an exophytic intermediate attenuation lesion (25 HU) measuring 1.9 cm in the anterior aspect of the interpolar region of the left kidney. A cluster of nonobstructive calculi is also noted in the posterior aspect of the upper pole collecting system of the right kidney measuring up to 3 mm. Musculoskeletal: There are no aggressive appearing lytic or blastic lesions noted in the visualized portions of the skeleton. IMPRESSION: 1. The appearance the chest is most suggestive of multilobar bilateral bronchopneumonia with bilateral parapneumonic pleural effusions, as above. 2. Given the nodular appearance of some of the areas of apparent airspace consolidation, follow-up noncontrast chest CT is recommended in 3 months to ensure resolution of the nodular areas following treatment for the patient's acute illness, to exclude underlying neoplasm. 3. Multiple renal lesions bilaterally, incompletely characterized on today's noncontrast CT examination, including an indeterminate 1.9 cm lesion in the anterior aspect of the interpolar region of the left kidney. Follow-up nonemergent outpatient abdominal MRI with and without IV gadolinium is recommended in the near future to definitively evaluate this lesion and exclude neoplasm. 4. Cholelithiasis. 5. Nonobstructive calculi in the right renal collecting system measuring up to 3 mm. 6. Ascites. 7. Aortic atherosclerosis. Aortic Atherosclerosis (ICD10-I70.0). Electronically Signed   By: Vinnie Langton M.D.   On: 05/16/2022 05:23   DG Chest Port 1 View  Result Date: 05/16/2022 CLINICAL DATA:  Shortness of breath EXAM: PORTABLE CHEST 1 VIEW COMPARISON:  05/07/2022 FINDINGS: Cardiac shadow is enlarged but stable. Small pleural effusion on the right is again noted. Some increased basilar density is noted on the right when compared with the prior study. Postsurgical  changes in the left breast are seen. No bony abnormality is noted. IMPRESSION: Persistent right-sided effusion with slight increase in basilar infiltrate on the right. Electronically Signed   By: Inez Catalina M.D.   On: 05/16/2022 03:45    Orson Eva, DO  [facility]  If 7PM-7AM, please contact night-coverage [email or web address] Password [email or web address] 05/28/2022, 7:25 AM   LOS: 12 days

## 2022-05-28 NOTE — Anesthesia Procedure Notes (Signed)
Arterial Line Insertion Start/End1/26/2024 1:10 AM, 05/28/2022 1:15 AM Performed by: Denese Killings, MD, anesthesiologist  Preanesthetic checklist: patient identified, IV checked, site marked, risks and benefits discussed, surgical consent, monitors and equipment checked, pre-op evaluation, timeout performed and anesthesia consent radial was placed Catheter size: 20 G Hand hygiene performed  and maximum sterile barriers used   Attempts: 1 Procedure performed without using ultrasound guided technique. Following insertion, Biopatch.

## 2022-05-28 NOTE — Progress Notes (Signed)
Pharmacy Antibiotic Note  Sheri Cooper is a 72 y.o. female admitted on 05/16/2022 with  duodenal perforation with purulent ascites .  Pharmacy has been consulted for Zosyn and Fluconazole dosing. WBC elevated. New to HD, has been doing TTS.   Plan: Zosyn 2.25g IV q8h Fluconazole 200 mg IV q24h Trend WBC, temp F/U infectious work-up  Height: '4\' 11"'$  (149.9 cm) Weight: 52.1 kg (114 lb 13.8 oz) IBW/kg (Calculated) : 43.2  Temp (24hrs), Avg:98.5 F (36.9 C), Min:97.8 F (36.6 C), Max:99.3 F (37.4 C)  Recent Labs  Lab 05/23/22 0414 05/24/22 0439 05/25/22 0404 05/26/22 0410 05/27/22 0420  WBC 20.1* 29.7* 20.4* 28.8* 24.3*  CREATININE 3.54* 2.54* 3.10* 2.00* 2.98*    Estimated Creatinine Clearance: 12.6 mL/min (A) (by C-G formula based on SCr of 2.98 mg/dL (H)).    No Known Allergies  Narda Bonds, PharmD, BCPS Clinical Pharmacist Phone: [phone]

## 2022-05-28 NOTE — TOC Progression Note (Signed)
Transition of Care [facility]) - Progression Note    Patient Details  Name: Sheri Cooper MRN: [id number] Date of Birth: 1950-12-23  Transition of Care [facility]) CM/SW Contact  Boneta Lucks, RN Phone Number: 05/28/2022, 10:32 AM  Clinical Narrative:   Patient had to be moved to ICU. DC plan maybe for Thursday 2/1. TOC update MD chair time is now 1/30. If we cancel they will not start her on a weekend. So first chair time would be 2/6. For now we decided to keep 1/30. She how patient does for the weekend and update [place] Monday.    Expected Discharge Plan: Home/Self Care Barriers to Discharge: Continued Medical Work up  Expected Discharge Plan and Services     Post Acute Care Choice: Dialysis Living arrangements for the past 2 months: Apartment                    Social Determinants of Health (SDOH) Interventions SDOH Screenings   Food Insecurity: No Food Insecurity (05/19/2022)  Housing: Low Risk  (05/19/2022)  Transportation Needs: No Transportation Needs (05/19/2022)  Utilities: Not At Risk (05/19/2022)  Tobacco Use: Medium Risk (05/27/2022)    Readmission Risk Interventions    05/18/2022   11:51 AM  Readmission Risk Prevention Plan  Transportation Screening Complete  PCP or Specialist Appt within 5-7 Days Not Complete  Home Care Screening Complete  Medication Review (RN CM) Complete

## 2022-05-28 NOTE — Anesthesia Procedure Notes (Addendum)
Procedure Name: Intubation Date/Time: 05/28/2022 12:41 AM  Performed by: Denese Killings, MDPre-anesthesia Checklist: Patient identified, Emergency Drugs available, Suction available and Patient being monitored Patient Re-evaluated:Patient Re-evaluated prior to induction Oxygen Delivery Method: Circle system utilized Preoxygenation: Pre-oxygenation with 100% oxygen Induction Type: IV induction, Cricoid Pressure applied and Rapid sequence Ventilation: Mask ventilation without difficulty Laryngoscope Size: Mac and 3 Grade View: Grade II Tube type: Oral Tube size: 7.0 mm Number of attempts: 1 Airway Equipment and Method: Stylet Placement Confirmation: ETT inserted through vocal cords under direct vision, positive ETCO2 and breath sounds checked- equal and bilateral Secured at: 21 cm Tube secured with: Tape Dental Injury: Teeth and Oropharynx as per pre-operative assessment

## 2022-05-28 NOTE — Consult Note (Signed)
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  She underwent CT angiogram that showed intraperitoneal air likely duodenal perforation.  She also developed abdominal distention without pain She underwent exploratory laparotomy with omental patch and drain placement on 1/26 and was brought back to the ICU intubated.  Purulent ascites was noted IntraOp  Pertinent  Medical History  ESRD on HD COPD Rheumatoid arthritis  Significant Hospital Events: Including procedures, antibiotic start and stop dates in addition to other pertinent events     Interim History / Subjective:  Hemodynamically stable Afebrile Critically ill, intubated Hypothermic on warmer  Objective   Blood pressure (!) 150/41, pulse 81, temperature 97.6 F (36.4 C), temperature source Oral, resp. rate (!) 6, height '4\' 11"'$  (1.499 m), weight 57.2 kg, SpO2 96 %.    Vent Mode: CPAP FiO2 (%):  [35 %-100 %] 35 % Set Rate:  [12 bmp-18 bmp] 12 bmp Vt Set:  [350 mL] 350 mL PEEP:  [5 cmH20] 5 cmH20 Pressure Support:  [5 cmH20-15 cmH20] 5 cmH20 Plateau Pressure:  [17 cmH20-18 cmH20] 17 cmH20   Intake/Output Summary (Last 24 hours) at  05/28/2022 1457 Last data filed at 05/28/2022 1327 Gross per 24 hour  Intake 1805.99 ml  Output 340 ml  Net 1465.99 ml   Filed Weights   05/27/22 1745 05/28/22 0316 05/28/22 0502  Weight: 52.1 kg 53.2 kg 57.2 kg    Examination: General: Thin frail woman, orally intubated, no distress, not on any sedation HENT: Mild pallor, no icterus, no JVD, left IJ CVL, right tunneled dialysis catheter Lungs: Clear breath sounds bilateral, no accessory muscle use Cardiovascular: S1-S2 regular, no rub Abdomen: Soft abdomen wrapped, no guarding Extremities: No deformity, no edema Neuro: Somnolent, wakes up easily and follows commands but drifts off to sleep  Labs show mild hypokalemia, magnesium 1.5, stable hemoglobin at 10.3-10.5, leukocytosis  Chest x-ray dependently reviewed shows ET tube in position, questionable retrocardiac consolidation, layering right effusion  Resolved Hospital Problem list     Assessment & Plan:  Postop ventilator management -She is awake and following commands, passed spontaneous breathing trials, placed on pressure support, tidal volumes are low, she was extubated to nasal cannula  Peritonitis -Zosyn/Diflucan for 5 to 7 days Duodenal perforation status post laparotomy -postop management per surgery  Protein calorie malnutrition, moderate = May need TNA especially with prolonged bowel rest plan  Upper GI bleed/duodenal ulcers clipped -PPI twice daily  CKD stage IIIb, 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 -on steroids per renal, may have to decrease/DC in context of bleeding, perforated ulcers Not clear if there is any hope for renal recovery here  Best Practice (right click and "Reselect all SmartList Selections" daily)   Diet/type: NPO DVT prophylaxis: SCD GI prophylaxis: PPI Lines:  Central line and yes and it is still needed Foley:  N/A Code Status:  full code Last date of multidisciplinary goals of care discussion [NA]  Labs   CBC: Recent Labs  Lab 05/24/22 0439  05/25/22 0404 05/26/22 0410 05/27/22 0420 05/27/22 1831 05/27/22 2350 05/28/22 0714 05/28/22 1208  WBC 29.7* 20.4* 28.8* 24.3*  --   --  18.6*  --   HGB 8.2* 9.1* 8.4* 8.0* 4.3* 9.5* 10.5* 10.3*  HCT 24.3* 27.9* 26.0* 24.6* 13.4* 28.4* 31.7* 30.9*  MCV 85.9 86.9 88.1 86.6  --   --  81.1  --   PLT 140* 146* 165 203  --   --  147*  --     Basic Metabolic Panel: Recent Labs  Lab 05/22/22 1039 05/22/22 1440 05/23/22 0414 05/24/22 0439 05/25/22 0404 05/26/22 0410 05/27/22 0420 05/28/22 0714 05/28/22 0803  NA  --   --  135 133* 134* 135 136 135 135  K  --    < > 3.6 2.9* 3.5 3.6 4.5 3.4* 3.5  CL  --   --  101 99 100 99 99 101 101  CO2  --   --  '24 24 23 24 26 24 23  '$ GLUCOSE  --   --  108* 78 70 64* 94 98 97  BUN  --   --  69* 35* 37* 19 33* 22 21  CREATININE  --   --  3.54* 2.54* 3.10* 2.00* 2.98* 1.76* 1.71*  CALCIUM  --   --  7.4* 7.1* 7.2* 7.1* 7.4* 6.6* 6.6*  MG 1.7  --  2.2 2.0  --   --   --   --  1.5*  PHOS  --   --  4.5 2.8 3.5 2.5 2.6 3.1 3.1   < > = values in this interval not displayed.   GFR: Estimated Creatinine Clearance: 22.9 mL/min (A) (by C-G formula based on SCr of 1.71 mg/dL (H)). Recent Labs  Lab 05/25/22 0404 05/26/22 0410 05/27/22 0420 05/28/22 0714  WBC 20.4* 28.8* 24.3* 18.6*    Liver Function Tests: Recent Labs  Lab 05/25/22 0404 05/26/22 0410 05/27/22 0420 05/28/22 0714 05/28/22 0803  AST  --   --   --   --  24  ALT  --   --   --   --  6  ALKPHOS  --   --   --   --  70  BILITOT  --   --   --   --  0.6  PROT  --   --   --   --  4.7*  ALBUMIN 2.1* 1.9* 1.9* 2.2* 2.1*   Recent Labs  Lab 05/25/22 0404  LIPASE 41   No results for input(s): "AMMONIA" in the last 168 hours.  ABG    Component Value Date/Time   PHART 7.43 05/28/2022 0258   PCO2ART 38 05/28/2022 0258   PO2ART 238 (H) 05/28/2022 0258   HCO3 25.4 05/28/2022 0258   ACIDBASEDEF 15.1 (H) 05/16/2022 0333   O2SAT 100 05/28/2022 0258     Coagulation Profile: No  results for input(s): "INR", "PROTIME" in the last 168 hours.  Cardiac Enzymes: No results for input(s): "CKTOTAL", "CKMB", "CKMBINDEX", "TROPONINI" in the last 168 hours.  HbA1C: No results found for: "HGBA1C"  CBG: Recent Labs  Lab 05/26/22 1111 05/28/22 0226 05/28/22 0519 05/28/22 0751 05/28/22 1121  GLUCAP 107* 101* 127* 110* 118*    Review of Systems:   Unable to obtain since  intubated  Past Medical History:  She,  has a past medical history of Chronic kidney disease (CKD), stage IV (severe) ([place]), COPD (chronic obstructive pulmonary disease) ([place]), and Rheumatoid arthritis ([place]).   Surgical History:   Past Surgical History:  Procedure Laterality Date   COLONOSCOPY WITH PROPOFOL N/A 05/22/2022   Procedure: COLONOSCOPY WITH PROPOFOL;  Surgeon: Harvel Quale, MD;  [facility];  Service: Gastroenterology;  Laterality: N/A;   ESOPHAGOGASTRODUODENOSCOPY (EGD) WITH PROPOFOL N/A 05/22/2022   Procedure: ESOPHAGOGASTRODUODENOSCOPY (EGD) WITH PROPOFOL;  Surgeon: Harvel Quale, MD;  Location: [facility];  Service: Gastroenterology;  Laterality: N/A;   IR FLUORO GUIDE CV LINE RIGHT  05/24/2022   IR US GUIDE VASC ACCESS RIGHT  05/24/2022   VOLVULUS REDUCTION     colon resection     Social History:   reports that she has quit smoking. Her smoking use included cigarettes. She has never used smokeless tobacco. She reports that she does not use drugs.   Family History:  Her family history is not on file.   Allergies No Known Allergies   Home Medications  Prior to Admission medications   Medication Sig Start Date End Date Taking? Authorizing Provider  ABRYSVO 120 MCG/0.5ML injection Inject 0.5 mLs into the muscle once. 01/18/22  Yes [provider]  amLODipine (NORVASC) 10 MG tablet Take 10 mg by mouth daily.   Yes [provider]  carvedilol (COREG) 25 MG tablet Take 25 mg by mouth 2 (two) times daily with a meal.  04/17/22  Yes [provider]  hydrALAZINE (APRESOLINE) 100 MG tablet Take 100 mg by mouth 2 (two) times daily. 04/17/22  Yes [provider]  hydrochlorothiazide (HYDRODIURIL) 25 MG tablet Take 25 mg by mouth daily. 01/06/22  Yes [provider]  rosuvastatin (CRESTOR) 20 MG tablet Take 20 mg by mouth daily.   Yes [provider]  sertraline (ZOLOFT) 50 MG tablet Take 1 tablet by mouth daily. 05/12/22 06/11/22 Yes [provider]  Adalimumab 20 MG/0.4ML PSKT Inject into the skin.    [provider]  metoprolol succinate (TOPROL-XL) 50 MG 24 hr tablet Take 50 mg by mouth daily. Patient not taking: Reported on 05/17/2022 03/31/22   [provider]     Critical care time: Superior MD Board Certified in Sleep medicine

## 2022-05-28 NOTE — Progress Notes (Signed)
Patient ID: Sheri Cooper, female   DOB: 06/07/1950, 72 y.o.   MRN: [id number]  S: events noted - had HD-  unable to UF due to hypotension then continued to have maroon stools, hgb as low as 4.3-  CT with contrast had to be done to revealed possible viscous perforation -  had exlap showed duodenal perforation-  repaired -  now in ICU due to continued intubation -  100 of UOP   O:BP (!) 141/47   Pulse 68   Temp 98.4 F (36.9 C) (Rectal)   Resp 18   Ht '4\' 11"'$  (1.499 m)   Wt 57.2 kg   SpO2 99%   BMI 25.47 kg/m   Intake/Output Summary (Last 24 hours) at 05/28/2022 0844 Last data filed at 05/28/2022 0600 Gross per 24 hour  Intake 2045.99 ml  Output 300 ml  Net 1745.99 ml   Intake/Output: I/O last 3 completed shifts: In: 2286 [P.O.:480; I.V.:741.1; Blood:964; IV Piggyback:100.9] Out: 300 [Urine:100; Drains:170; Other:10; Blood:20]  Intake/Output this shift:  No intake/output data recorded. Weight change: -0.1 kg Gen: on vent but arousable  CVS: tachy Resp: CTA Abd: +BS, soft, + diffuse tenderness to palpation, no guarding or rebound Ext: 1+ pretibial edema bilaterally  Recent Labs  Lab 05/23/22 0414 05/24/22 0439 05/25/22 0404 05/26/22 0410 05/27/22 0420 05/28/22 0714 05/28/22 0803  NA 135 133* 134* 135 136 135 135  K 3.6 2.9* 3.5 3.6 4.5 3.4* 3.5  CL 101 99 100 99 99 101 101  CO2 '24 24 23 24 26 24 23  '$ GLUCOSE 108* 78 70 64* 94 98 97  BUN 69* 35* 37* 19 33* 22 21  CREATININE 3.54* 2.54* 3.10* 2.00* 2.98* 1.76* 1.71*  ALBUMIN 2.1* 2.1* 2.1* 1.9* 1.9* 2.2* 2.1*  CALCIUM 7.4* 7.1* 7.2* 7.1* 7.4* 6.6* 6.6*  PHOS 4.5 2.8 3.5 2.5 2.6 3.1 3.1  AST  --   --   --   --   --   --  24  ALT  --   --   --   --   --   --  6   Liver Function Tests: Recent Labs  Lab 05/27/22 0420 05/28/22 0714 05/28/22 0803  AST  --   --  24  ALT  --   --  6  ALKPHOS  --   --  70  BILITOT  --   --  0.6  PROT  --   --  4.7*  ALBUMIN 1.9* 2.2* 2.1*   Recent Labs  Lab 05/25/22 0404   LIPASE 41   No results for input(s): "AMMONIA" in the last 168 hours. CBC: Recent Labs  Lab 05/24/22 0439 05/25/22 0404 05/26/22 0410 05/27/22 0420 05/27/22 1831 05/27/22 2350 05/28/22 0714  WBC 29.7* 20.4* 28.8* 24.3*  --   --  18.6*  HGB 8.2* 9.1* 8.4* 8.0* 4.3* 9.5* 10.5*  HCT 24.3* 27.9* 26.0* 24.6* 13.4* 28.4* 31.7*  MCV 85.9 86.9 88.1 86.6  --   --  81.1  PLT 140* 146* 165 203  --   --  147*   Cardiac Enzymes: No results for input(s): "CKTOTAL", "CKMB", "CKMBINDEX", "TROPONINI" in the last 168 hours. CBG: Recent Labs  Lab 05/26/22 0819 05/26/22 1111 05/28/22 0226 05/28/22 0519 05/28/22 0751  GLUCAP 95 107* 101* 127* 110*    Iron Studies: No results for input(s): "IRON", "TIBC", "TRANSFERRIN", "FERRITIN" in the last 72 hours. Studies/Results: DG Chest Port 1 View  Result Date: 05/28/2022 CLINICAL DATA:  Respiratory failure  EXAM: PORTABLE CHEST 1 VIEW COMPARISON:  05/25/2022 FINDINGS: Endotracheal tube is seen 3.5 cm above the carina. Nasogastric tube extends into the upper abdomen beyond the margin of the examination. Left internal jugular central venous catheter tip within the superior vena cava. Right internal jugular hemodialysis catheter tip noted within the right atrium. The lungs are symmetrically well expanded. Partial left lower lobe collapse noted, however, with retrocardiac consolidation. Mild relative opacification the right hemithorax suggest the presence of a posteriorly layering pleural effusion on this supine examination. Superimposed trace perihilar interstitial pulmonary infiltrate is in keeping with trace probable interstitial pulmonary edema. No pneumothorax. Cardiac size is within normal limits. IMPRESSION: 1. Support lines and tubes in appropriate position. 2. Partial left lower lobe collapse. 3. Suspected posteriorly layering right pleural effusion. 4. Trace probable interstitial pulmonary edema. Electronically Signed   By: Fidela Salisbury M.D.   On:  05/28/2022 03:32   CT ANGIO GI BLEED  Result Date: 05/27/2022 CLINICAL DATA:  Recurrent gastrointestinal hemorrhage EXAM: CTA ABDOMEN AND PELVIS WITHOUT AND WITH CONTRAST TECHNIQUE: Multidetector CT imaging of the abdomen and pelvis was performed using the standard protocol during bolus administration of intravenous contrast. Multiplanar reconstructed images and MIPs were obtained and reviewed to evaluate the vascular anatomy. RADIATION DOSE REDUCTION: This exam was performed according to the departmental dose-optimization program which includes automated exposure control, adjustment of the mA and/or kV according to patient size and/or use of iterative reconstruction technique. CONTRAST:  55m OMNIPAQUE IOHEXOL 350 MG/ML SOLN COMPARISON:  None Available. FINDINGS: VASCULAR Aorta: Moderate mixed atherosclerotic plaque. No aortic aneurysm or dissection. No periaortic inflammatory change identified. Celiac: Patent without evidence of aneurysm, dissection, vasculitis or significant stenosis. SMA: Patent without evidence of aneurysm, dissection, vasculitis or significant stenosis. Renals: High-grade (near occlusion) stenosis of the a left renal artery origin. Less than 50% stenosis of the right renal artery origin. Normal vascular morphology. Relatively diminutive caliber of the left renal arterial vasculature distal the stenosis may relate to under filling. No aneurysm or dissection. IMA: Occluded at its origin with reconstitution via the marginal artery. 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 Focal irregular minimum 50% stenosis of the left common iliac artery secondary to a short segment dissection flap within the proximal left common iliac artery. Right internal iliac artery is heavily diseased and occluded at its origin with subsequent reconstitution. High-grade focal stenosis of the left internal iliac artery at its origin. Proximal Outflow: Bilateral common femoral and  visualized portions of the superficial and profunda femoral arteries are patent without evidence of aneurysm, dissection, vasculitis or significant stenosis. Veins: Mesenteric, splenic, portal venous, and hepatic venous structures are patent. Iliofemoral veins are patent. Renal veins and inferior vena cava are patent. Review of the MIP images confirms the above findings. NON-VASCULAR Lower chest: Small left and moderate right pleural effusions are present with bibasilar compressive atelectasis. Hepatobiliary: Cholelithiasis without pericholecystic inflammatory change. Liver unremarkable. No intra or extrahepatic biliary ductal dilation. Pancreas: Unremarkable Spleen: Unremarkable Adrenals/Urinary Tract: The adrenal glands are unremarkable. Moderate left renal cortical atrophy. Right kidney is normal in size. 6 mm plaque-like nonobstructing calculus noted within the upper pole the right kidney. Multiple simple cortical cysts are seen within the right kidney. Hypodense 16 mm lesion is seen within the interpolar region of the right kidney demonstrates no significant enhancement is compatible with a Bosniak class 2 cyst. No follow-up imaging is recommended for these lesions. No hydronephrosis. The bladder is unremarkable. Stomach/Bowel: There is  mild free intraperitoneal gas present. The exact site of visceral perforation is not clearly identified on this examination, however, there is extraluminal gas and debris surrounding the first and proximal second portion of the duodenum raising the question of a perforation in this location. Endoscopic clip noted within the juncture of the first and second portion of the duodenum. Moderate free intraperitoneal fluid. Endoscopic clip laying dependently within the cecum. The stomach, small bowel, and large bowel are otherwise unremarkable. No active gastrointestinal hemorrhage identified. Fluid within the descending colon and rectal vault may present clinically as diarrhea.  Lymphatic: No pathologic adenopathy within the abdomen and pelvis. Reproductive: Involuted fibroid within the uterus. The pelvic organs are otherwise unremarkable. Other: Moderate subcutaneous body wall edema noted dependently. Musculoskeletal: No acute bone abnormality. No lytic or blastic bone lesion. IMPRESSION: VASCULAR . 1. 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 2. 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 Focal irregular minimum 50% stenosis of the left common iliac artery secondary to a short segment dissection flap. 3. 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 High-grade focal stenosis of the left internal iliac artery at its origin. 4. 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 NON-VASCULAR . 1. Moderate free intraperitoneal gas and fluid in keeping with visceral perforation. The exact site is not clearly identified on this examination, however, more focal debris and gas adjacent to the juncture of the first and second portion of the duodenum raises the question of a perforation in this location. 2. No active gastrointestinal hemorrhage identified 3. Cholelithiasis. 4. Mild right nonobstructing nephrolithiasis. 5. Fluid within the descending colon and rectal vault may present clinically as diarrhea. Aortic Atherosclerosis (ICD10-I70.0). Electronically Signed   By: Fidela Salisbury M.D.   On: 05/27/2022 21:51    calcium acetate  667 mg Oral TID WC   Chlorhexidine Gluconate Cloth  6 each Topical Daily   [START ON 06/01/2022] darbepoetin (ARANESP) injection - DIALYSIS  200 mcg Subcutaneous Q Tue-1800   hydrALAZINE  5 mg Intravenous Q8H   methylPREDNISolone (SOLU-MEDROL) injection  60 mg Intravenous Daily   pantoprazole  40 mg Intravenous Q12H    BMET    Component Value Date/Time    NA 135 05/28/2022 0803   K 3.5 05/28/2022 0803   CL 101 05/28/2022 0803   CO2 23 05/28/2022 0803   GLUCOSE 97 05/28/2022 0803   BUN 21 05/28/2022 0803   CREATININE 1.71 (H) 05/28/2022 0803   CALCIUM 6.6 (L) 05/28/2022 0803   GFRNONAA 31 (L) 05/28/2022 0803   CBC    Component Value Date/Time   WBC 18.6 (H) 05/28/2022 0714   RBC 3.91 05/28/2022 0714   HGB 10.5 (L) 05/28/2022 0714   HCT 31.7 (L) 05/28/2022 0714   PLT 147 (L) 05/28/2022 0714   MCV 81.1 05/28/2022 0714   MCH 26.9 05/28/2022 0714   MCHC 33.1 05/28/2022 0714   RDW 18.8 (H) 05/28/2022 0714   LYMPHSABS 1.4 05/16/2022 0309   MONOABS 0.6 05/16/2022 0309   EOSABS 0.0 05/16/2022 0309   BASOSABS 0.1 05/16/2022 0309    Assessment/Plan:   AKI/CKD stage IIIb - rapidly progressive.  ANCA, ANA, anti-GBM neg, C3/C4 WNL. HIV, Hep B neg. FLC and SPEP neg.  She had  first HD session on 05/18/22 via temp RIJ HD catheter and started on IV solumedrol x 3.  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 Plan: stay the course with prednisone 60 mg daily for now, will need prolonged taper.   S/P TDC placement by IR 05/24/22 at [facility].  continue with IHD on TTS schedule for now.  She has a chair at [facility] TTS pm shift.  Will need to be there at 10:30 am on 06/01/22.    Continue strict Input and Output monitoring. Will monitor the patient closely with you and intervene or adjust therapy as indicated by changes in clinical status/labs.  Is now making some urine when was making none-  BUN and crt climbing off of HD but pre HD crt trending down  ?  no need for HD today but did get lots of fluids with transfusion and OR-  will plan for HD tomorrow.  Concern that steroids doing more harm than good at this point-  now on solumedrol 60-  will wean it and call this a treatment failure HCAP and COPD exacerbation - abx per primary. Anemia of CKD stage V - started on ESA and transfuse prn.  FOBT +, GI consulted.  EGD with grade C  esophagitis and gastritis with small nonbleeding ulcers in fundus.  2 ulcers in the duodenum with large adherent clots with underlying bleeding vessels treated with epi and coagulation.  One vessel treated also with a clip, however second vessel could not be clipped.  on PPI.  Hgb trending down.  Continue with ESA (darbe 200 weekly)and transfuse prn. Iron sat on 1/14 was OK .  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  No heparin. Hypokalemia - replete orally and follow.  Will use 4K bath with HD today-  have stopped repletion and sodium bicarb HTN/volume- BP highish-  ok to continue norvasc and hydralazine-  gentle UF -  got BP meds prior to HD yest-  that was the issue-  not taking PO's at present  Disposition - has outpatient spot on 06/01/22 at [facility].  Pt will not be physically seen over the weekend-  plan as above to wean steroids and continue HD at tts schedule   [place]

## 2022-05-28 NOTE — Progress Notes (Signed)
Gastroenterology Progress Note   Referring Provider: No ref. provider found Primary Care Physician:  [facility] Primary Gastroenterologist:  Dr. Jenetta Downer  Patient ID: Sheri Cooper; [id number]; [birth year]    Subjective   Patient remains drowsy and intubated in the ICU but easily awakens with verbal stimulation. Nods head no to pain.    Objective   Vital signs in last 24 hours Temp:  [94.8 F (34.9 C)-99.3 F (37.4 C)] 98.4 F (36.9 C) (01/26 0700) Pulse Rate:  [68-103] 68 (01/26 0630) Resp:  [13-18] 18 (01/26 0630) BP: (81-171)/(40-55) 141/47 (01/26 0600) SpO2:  [92 %-100 %] 99 % (01/26 0630) Arterial Line BP: (151-215)/(46-62) 151/46 (01/26 0630) FiO2 (%):  [50 %-100 %] 50 % (01/26 0319) Weight:  [52.1 kg-57.2 kg] 57.2 kg (01/26 0502) Last BM Date : 05/27/22  Physical Exam General:   Drowsy, NAD Head:  Normocephalic and atraumatic. Eyes:  No icterus, sclera clear. Conjuctiva pink.  Mouth:  ETT in place Nose: NGT in place with minimal bilious output. No signs of blood/black output.  Abdomen:  Bowel sounds hypoactive, soft, mild ttp to midline . Midline incision, C,D, I.  No masses appreciated  Extremities:  Without clubbing or edema. Neurologic:  Drowsy and  oriented x4 (nods Y/N to questions);  grossly normal neurologically. Follows commands Psych:  Drowsy and cooperative.   Intake/Output from previous day: 01/25 0701 - 01/26 0700 In: 2046 [P.O.:240; I.V.:741.1; Blood:964; IV Piggyback:100.9] Out: 300 [Urine:100; Drains:170; Blood:20] Intake/Output this shift: No intake/output data recorded.  Lab Results  Recent Labs    05/26/22 0410 05/27/22 0420 05/27/22 1831 05/27/22 2350 05/28/22 0714  WBC 28.8* 24.3*  --   --  18.6*  HGB 8.4* 8.0* 4.3* 9.5* 10.5*  HCT 26.0* 24.6* 13.4* 28.4* 31.7*  PLT 165 203  --   --  147*   BMET Recent Labs    05/27/22 0420 05/28/22 0714 05/28/22 0803  NA 136 135 135  K 4.5 3.4* 3.5  CL 99 101 101   CO2 '26 24 23  '$ GLUCOSE 94 98 97  BUN 33* 22 21  CREATININE 2.98* 1.76* 1.71*  CALCIUM 7.4* 6.6* 6.6*   LFT Recent Labs    05/27/22 0420 05/28/22 0714 05/28/22 0803  PROT  --   --  4.7*  ALBUMIN 1.9* 2.2* 2.1*  AST  --   --  24  ALT  --   --  6  ALKPHOS  --   --  70  BILITOT  --   --  0.6   PT/INR No results for input(s): "LABPROT", "INR" in the last 72 hours. Hepatitis Panel No results for input(s): "HEPBSAG", "HCVAB", "HEPAIGM", "HEPBIGM" in the last 72 hours.   Studies/Results DG Chest Port 1 View  Result Date: 05/28/2022 CLINICAL DATA:  Respiratory failure EXAM: PORTABLE CHEST 1 VIEW COMPARISON:  05/25/2022 FINDINGS: Endotracheal tube is seen 3.5 cm above the carina. Nasogastric tube extends into the upper abdomen beyond the margin of the examination. Left internal jugular central venous catheter tip within the superior vena cava. Right internal jugular hemodialysis catheter tip noted within the right atrium. The lungs are symmetrically well expanded. Partial left lower lobe collapse noted, however, with retrocardiac consolidation. Mild relative opacification the right hemithorax suggest the presence of a posteriorly layering pleural effusion on this supine examination. Superimposed trace perihilar interstitial pulmonary infiltrate is in keeping with trace probable interstitial pulmonary edema. No pneumothorax. Cardiac size is within normal limits. IMPRESSION: 1. Support lines and tubes in  appropriate position. 2. Partial left lower lobe collapse. 3. Suspected posteriorly layering right pleural effusion. 4. Trace probable interstitial pulmonary edema. Electronically Signed   By: Fidela Salisbury M.D.   On: 05/28/2022 03:32   CT ANGIO GI BLEED  Result Date: 05/27/2022 CLINICAL DATA:  Recurrent gastrointestinal hemorrhage EXAM: CTA ABDOMEN AND PELVIS WITHOUT AND WITH CONTRAST TECHNIQUE: Multidetector CT imaging of the abdomen and pelvis was performed using the standard protocol during  bolus administration of intravenous contrast. Multiplanar reconstructed images and MIPs were obtained and reviewed to evaluate the vascular anatomy. RADIATION DOSE REDUCTION: This exam was performed according to the departmental dose-optimization program which includes automated exposure control, adjustment of the mA and/or kV according to patient size and/or use of iterative reconstruction technique. CONTRAST:  37m OMNIPAQUE IOHEXOL 350 MG/ML SOLN COMPARISON:  None Available. FINDINGS: VASCULAR Aorta: Moderate mixed atherosclerotic plaque. No aortic aneurysm or dissection. No periaortic inflammatory change identified. Celiac: Patent without evidence of aneurysm, dissection, vasculitis or significant stenosis. SMA: Patent without evidence of aneurysm, dissection, vasculitis or significant stenosis. Renals: High-grade (near occlusion) stenosis of the a left renal artery origin. Less than 50% stenosis of the right renal artery origin. Normal vascular morphology. Relatively diminutive caliber of the left renal arterial vasculature distal the stenosis may relate to under filling. No aneurysm or dissection. IMA: Occluded at its origin with reconstitution via the marginal artery. 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 Focal irregular minimum 50% stenosis of the left common iliac artery secondary to a short segment dissection flap within the proximal left common iliac artery. Right internal iliac artery is heavily diseased and occluded at its origin with subsequent reconstitution. High-grade focal stenosis of the left internal iliac artery at its origin. Proximal Outflow: Bilateral common femoral and visualized portions of the superficial and profunda femoral arteries are patent without evidence of aneurysm, dissection, vasculitis or significant stenosis. Veins: Mesenteric, splenic, portal venous, and hepatic venous structures are patent. Iliofemoral veins are patent. Renal veins  and inferior vena cava are patent. Review of the MIP images confirms the above findings. NON-VASCULAR Lower chest: Small left and moderate right pleural effusions are present with bibasilar compressive atelectasis. Hepatobiliary: Cholelithiasis without pericholecystic inflammatory change. Liver unremarkable. No intra or extrahepatic biliary ductal dilation. Pancreas: Unremarkable Spleen: Unremarkable Adrenals/Urinary Tract: The adrenal glands are unremarkable. Moderate left renal cortical atrophy. Right kidney is normal in size. 6 mm plaque-like nonobstructing calculus noted within the upper pole the right kidney. Multiple simple cortical cysts are seen within the right kidney. Hypodense 16 mm lesion is seen within the interpolar region of the right kidney demonstrates no significant enhancement is compatible with a Bosniak class 2 cyst. No follow-up imaging is recommended for these lesions. No hydronephrosis. The bladder is unremarkable. Stomach/Bowel: There is mild free intraperitoneal gas present. The exact site of visceral perforation is not clearly identified on this examination, however, there is extraluminal gas and debris surrounding the first and proximal second portion of the duodenum raising the question of a perforation in this location. Endoscopic clip noted within the juncture of the first and second portion of the duodenum. Moderate free intraperitoneal fluid. Endoscopic clip laying dependently within the cecum. The stomach, small bowel, and large bowel are otherwise unremarkable. No active gastrointestinal hemorrhage identified. Fluid within the descending colon and rectal vault may present clinically as diarrhea. Lymphatic: No pathologic adenopathy within the abdomen and pelvis. Reproductive: Involuted fibroid within the uterus. The pelvic organs are otherwise unremarkable.  Other: Moderate subcutaneous body wall edema noted dependently. Musculoskeletal: No acute bone abnormality. No lytic or blastic  bone lesion. IMPRESSION: VASCULAR . 1. 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 2. 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 Focal irregular minimum 50% stenosis of the left common iliac artery secondary to a short segment dissection flap. 3. 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 High-grade focal stenosis of the left internal iliac artery at its origin. 4. 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 NON-VASCULAR . 1. Moderate free intraperitoneal gas and fluid in keeping with visceral perforation. The exact site is not clearly identified on this examination, however, more focal debris and gas adjacent to the juncture of the first and second portion of the duodenum raises the question of a perforation in this location. 2. No active gastrointestinal hemorrhage identified 3. Cholelithiasis. 4. Mild right nonobstructing nephrolithiasis. 5. Fluid within the descending colon and rectal vault may present clinically as diarrhea. Aortic Atherosclerosis (ICD10-I70.0). Electronically Signed   By: Fidela Salisbury M.D.   On: 05/27/2022 21:51   DG CHEST PORT 1 VIEW  Result Date: 05/25/2022 CLINICAL DATA:  Chest pain EXAM: PORTABLE CHEST 1 VIEW COMPARISON:  05/22/2022 FINDINGS: Right internal jugular approach hemodialysis catheter terminates at the level of the right atrium. Stable heart size. Small right pleural effusion with associated right basilar opacity, similar to prior. Similar prominent interstitial markings bilaterally. No pneumothorax. IMPRESSION: 1. Small right pleural effusion with associated right basilar opacity, similar to prior. 2. Similar prominent interstitial markings bilaterally, likely mild edema. Electronically Signed   By: Davina Poke D.O.   On: 05/25/2022 11:39   DG Abd 1  View  Result Date: 05/25/2022 CLINICAL DATA:  Abdominal pain EXAM: ABDOMEN - 1 VIEW COMPARISON:  05/22/2022 FINDINGS: The bowel gas pattern is normal. No radio-opaque calculi or other significant radiographic abnormality are seen. Surgical clips overlie the pelvis and right upper quadrant. Calcification consistent with fibroid noted. IMPRESSION: Unremarkable bowel gas pattern. Calcified fibroid incidentally noted Electronically Signed   By: Sammie Bench M.D.   On: 05/25/2022 08:22   IR Fluoro Guide CV Line Right  Result Date: 05/24/2022 INDICATION: 72 year old female in need of durable dialysis access. She presents for removal temporary dialysis catheter in placement of a tunneled dialysis catheter. EXAM: TUNNELED CENTRAL VENOUS HEMODIALYSIS CATHETER PLACEMENT WITH ULTRASOUND AND FLUOROSCOPIC GUIDANCE MEDICATIONS: 2 g Ancef. The antibiotic was given in an appropriate time interval prior to skin puncture. ANESTHESIA/SEDATION: Moderate (conscious) sedation was employed during this procedure. A total of Versed 1 mg and Fentanyl 25 mcg was administered intravenously. Moderate Sedation Time: 11 minutes. The patient's level of consciousness and vital signs were monitored continuously by radiology nursing throughout the procedure under my direct supervision. FLUOROSCOPY TIME:  Radiation exposure index: 1 mGy reference air kerma COMPLICATIONS: None immediate. PROCEDURE: Informed written consent was obtained from the patient after a discussion of the risks, benefits, and alternatives to treatment. Questions regarding the procedure were encouraged and answered. The right neck and chest were prepped with chlorhexidine in a sterile fashion, and a sterile drape was applied covering the operative field. Maximum barrier sterile technique with sterile gowns and gloves were used for the procedure. A timeout was performed prior to the initiation of the procedure. After creating a small venotomy incision, a micropuncture kit  was utilized  to access the right internal jugular vein under direct, real-time ultrasound guidance after the overlying soft tissues were anesthetized with 1% lidocaine with epinephrine. Ultrasound image documentation was performed. The microwire was kinked to measure appropriate catheter length. A stiff Glidewire was advanced to the level of the IVC and the micropuncture sheath was exchanged for a peel-away sheath. A palindrome tunneled hemodialysis catheter measuring 19 cm from tip to cuff was tunneled in a retrograde fashion from the anterior chest wall to the venotomy incision. The catheter was then placed through the peel-away sheath with tips ultimately positioned within the superior aspect of the right atrium. Final catheter positioning was confirmed and documented with a spot radiographic image. The catheter aspirates and flushes normally. The catheter was flushed with appropriate volume heparin dwells. The catheter exit site was secured with a 0-Prolene retention suture. The venotomy incision was closed with an interrupted 4-0 Vicryl, Dermabond and Steri-strips. Dressings were applied. The patient tolerated the procedure well without immediate post procedural complication. IMPRESSION: Successful placement of 19 cm tip to cuff tunneled hemodialysis catheter via the right internal jugular vein with tips terminating within the superior aspect of the right atrium. The catheter is ready for immediate use. Electronically Signed   By: Jacqulynn Cadet M.D.   On: 05/24/2022 12:39   IR US Guide Vasc Access Right  Result Date: 05/24/2022 INDICATION: 72 year old female in need of durable dialysis access. She presents for removal temporary dialysis catheter in placement of a tunneled dialysis catheter. EXAM: TUNNELED CENTRAL VENOUS HEMODIALYSIS CATHETER PLACEMENT WITH ULTRASOUND AND FLUOROSCOPIC GUIDANCE MEDICATIONS: 2 g Ancef. The antibiotic was given in an appropriate time interval prior to skin puncture.  ANESTHESIA/SEDATION: Moderate (conscious) sedation was employed during this procedure. A total of Versed 1 mg and Fentanyl 25 mcg was administered intravenously. Moderate Sedation Time: 11 minutes. The patient's level of consciousness and vital signs were monitored continuously by radiology nursing throughout the procedure under my direct supervision. FLUOROSCOPY TIME:  Radiation exposure index: 1 mGy reference air kerma COMPLICATIONS: None immediate. PROCEDURE: Informed written consent was obtained from the patient after a discussion of the risks, benefits, and alternatives to treatment. Questions regarding the procedure were encouraged and answered. The right neck and chest were prepped with chlorhexidine in a sterile fashion, and a sterile drape was applied covering the operative field. Maximum barrier sterile technique with sterile gowns and gloves were used for the procedure. A timeout was performed prior to the initiation of the procedure. After creating a small venotomy incision, a micropuncture kit was utilized to access the right internal jugular vein under direct, real-time ultrasound guidance after the overlying soft tissues were anesthetized with 1% lidocaine with epinephrine. Ultrasound image documentation was performed. The microwire was kinked to measure appropriate catheter length. A stiff Glidewire was advanced to the level of the IVC and the micropuncture sheath was exchanged for a peel-away sheath. A palindrome tunneled hemodialysis catheter measuring 19 cm from tip to cuff was tunneled in a retrograde fashion from the anterior chest wall to the venotomy incision. The catheter was then placed through the peel-away sheath with tips ultimately positioned within the superior aspect of the right atrium. Final catheter positioning was confirmed and documented with a spot radiographic image. The catheter aspirates and flushes normally. The catheter was flushed with appropriate volume heparin dwells. The  catheter exit site was secured with a 0-Prolene retention suture. The venotomy incision was closed with an interrupted 4-0 Vicryl, Dermabond and Steri-strips. Dressings were applied. The patient  tolerated the procedure well without immediate post procedural complication. IMPRESSION: Successful placement of 19 cm tip to cuff tunneled hemodialysis catheter via the right internal jugular vein with tips terminating within the superior aspect of the right atrium. The catheter is ready for immediate use. Electronically Signed   By: Jacqulynn Cadet M.D.   On: 05/24/2022 12:39   DG CHEST PORT 1 VIEW  Result Date: 05/22/2022 CLINICAL DATA:  Evaluate for intra-abdominal free air. EXAM: PORTABLE CHEST 1 VIEW COMPARISON:  Abdominal radiograph 05/22/2022 FINDINGS: Central venous catheter tip projects over the superior vena cava. Stable cardiac and mediastinal contours. Small layering right pleural effusion with underlying consolidation. No pneumothorax. No definite free air no definite gas identified underlying the hemidiaphragms. IMPRESSION: 1. Small layering right pleural effusion with underlying consolidation. 2. No definite free air identified underlying the hemidiaphragms. Consider further evaluation with decubitus imaging of the abdomen if there is persistent clinical concern given the limitations of this portable semi-erect chest radiograph. Electronically Signed   By: Lovey Newcomer M.D.   On: 05/22/2022 16:02   DG Abd 1 View  Result Date: 05/22/2022 CLINICAL DATA:  Postoperative abdominal pain. EXAM: ABDOMEN - 1 VIEW COMPARISON:  None Available. FINDINGS: Gas is demonstrated within nondilated loops of large and small bowel in a nonobstructed pattern. Supine evaluation limited for the detection of free intraperitoneal air. Lumbar spine degenerative changes. Calcific density projecting over the pelvis, nonspecific, potentially calcified uterine fibroid. IMPRESSION: Nonobstructed bowel-gas pattern. Electronically  Signed   By: Lovey Newcomer M.D.   On: 05/22/2022 14:08   DG CHEST PORT 1 VIEW  Result Date: 05/21/2022 CLINICAL DATA:  Leukocytosis EXAM: PORTABLE CHEST 1 VIEW COMPARISON:  05/18/2022 FINDINGS: Central venous line tip in distal SVC. Normal cardiac silhouette. Small RIGHT effusion. Mild venous congestion. No pneumothorax. No focal consolidation. Lymphadenectomy clips in the LEFT axilla. No significant interval change. IMPRESSION: 1. No significant interval change. 2. Small RIGHT effusion. 3. Mild venous congestion. Electronically Signed   By: Suzy Bouchard M.D.   On: 05/21/2022 08:22   US BIOPSY (KIDNEY)  Result Date: 05/19/2022 INDICATION: 72 year old female with history of end-stage renal disease. EXAM: ULTRASOUND GUIDED RENAL BIOPSY COMPARISON:  None Available. MEDICATIONS: None. ANESTHESIA/SEDATION: Fentanyl 25 mcg IV; Versed 0.5 mg IV Total Moderate Sedation time: 10 minutes; The patient was continuously monitored during the procedure by the interventional radiology nurse under my direct supervision. COMPLICATIONS: None immediate. PROCEDURE: Informed written consent was obtained from the patient after a discussion of the risks, benefits and alternatives to treatment. The patient understands and consents the procedure. A timeout was performed prior to the initiation of the procedure. Ultrasound scanning was performed of the bilateral flanks. The inferior pole of the left kidney was selected for biopsy due to location and sonographic window. The procedure was planned. The operative site was prepped and draped in the usual sterile fashion. The overlying soft tissues were anesthetized with 1% lidocaine with epinephrine. A 17 gauge coaxial introducer needle was advanced into the inferior cortex of the left kidney and 2 core biopsies were obtained under direct ultrasound guidance with a 16 gauge core biopsy device. Images were saved for documentation purposes. The biopsy device was removed and hemostasis was  obtained with manual compression after injection of Gel-Foam slurry along the needle track under ultrasound guidance. Post procedural scanning was negative for significant post procedural hemorrhage or additional complication. A dressing was placed. The patient tolerated the procedure well without immediate post procedural complication. IMPRESSION: Technically successful ultrasound  guided left renal biopsy. Ruthann Cancer, MD [facility] Electronically Signed   By: Ruthann Cancer M.D.   On: 05/19/2022 14:19   DG Chest Port 1 View  Result Date: 05/18/2022 CLINICAL DATA:  Central line placement. EXAM: PORTABLE CHEST 1 VIEW COMPARISON:  CT chest and chest x-ray from 2 days ago. FINDINGS: New right internal jugular central venous catheter with tip at the cavoatrial junction. The heart size and mediastinal contours are within normal limits. Mild diffuse interstitial thickening similar. Decreasing small right and unchanged small left pleural effusions. Increasing asymmetric density in the peripheral left upper lobe. Similar atelectasis in the peripheral right middle lobe. Improving right lower lobe atelectasis. No pneumothorax. No acute osseous abnormality. IMPRESSION: 1. New right internal jugular central venous catheter with tip at the cavoatrial junction. No pneumothorax. 2. Similar diffuse interstitial thickening with increasing asymmetric density in the peripheral left upper lobe. Differential considerations include infection or pulmonary edema. 3. Decreased small right and unchanged small left pleural effusions. Electronically Signed   By: Titus Dubin M.D.   On: 05/18/2022 14:06   US RENAL  Result Date: 05/18/2022 CLINICAL DATA:  Chronic kidney disease EXAM: RENAL / URINARY TRACT ULTRASOUND COMPLETE COMPARISON:  Abdomen CT report 01/13/2018 FINDINGS: Right Kidney: Renal measurements: 8.9 x 4.7 x 4.4 = volume: 97 mL. Kidney appears echogenic. No  collecting system dilatation. There is a simple cyst along the midportion of the kidney which is anechoic with through transmission measuring 17 mm. Left Kidney: Renal measurements: 7.5 x 3.9 x 3.2 = volume: 49 mL. Small echogenic appearing kidney. No collecting system dilatation. Anechoic rounded structure seen in the mid kidney measuring 2 cm. Through transmission is smoothly marginated. Bladder: Appears normal for degree of bladder distention. Other: Scattered ascites.  Probable right-sided pleural effusion. IMPRESSION: Echogenic small appearing kidneys. No collecting system dilatation. Bilateral simple appearing renal cysts. Ascites.  Right pleural effusion Electronically Signed   By: Jill Side M.D.   On: 05/18/2022 11:07   CT Chest Wo Contrast  Result Date: 05/16/2022 CLINICAL DATA:  72 year old female with history of suspected pneumonia. EXAM: CT CHEST WITHOUT CONTRAST TECHNIQUE: Multidetector CT imaging of the chest was performed following the standard protocol without IV contrast. RADIATION DOSE REDUCTION: This exam was performed according to the departmental dose-optimization program which includes automated exposure control, adjustment of the mA and/or kV according to patient size and/or use of iterative reconstruction technique. COMPARISON:  Chest x-ray 05/16/2022. FINDINGS: Cardiovascular: Heart size is normal. There is no significant pericardial fluid, thickening or pericardial calcification. Aortic atherosclerosis. No definite coronary artery calcifications. Mild calcifications of the aortic valve. Mediastinum/Nodes: No pathologically enlarged mediastinal or hilar lymph nodes. Please note that accurate exclusion of hilar adenopathy is limited on noncontrast CT scans. Esophagus is unremarkable in appearance. No axillary lymphadenopathy. Lungs/Pleura: Moderate to large right and small to moderate left pleural effusions lying dependently associated with areas of passive subsegmental atelectasis in  the lower lobes of the lungs bilaterally. In addition, there are mid to upper lung predominant patchy areas of ground-glass attenuation and nodular appearing consolidative airspace disease, likely to reflect multilobar bilateral bronchopneumonia. The largest of these nodular appearing regions in the apex of the left upper lobe (axial image 32 of series 4) measuring 1.3 x 1.2 cm. Upper Abdomen: Aortic atherosclerosis. Tiny partially calcified gallstones lying dependently in the fundus of the gallbladder. Small volume of ascites. Multiple renal lesions bilaterally, incompletely characterized on today's noncontrast CT examination, most notable  for an exophytic intermediate attenuation lesion (25 HU) measuring 1.9 cm in the anterior aspect of the interpolar region of the left kidney. A cluster of nonobstructive calculi is also noted in the posterior aspect of the upper pole collecting system of the right kidney measuring up to 3 mm. Musculoskeletal: There are no aggressive appearing lytic or blastic lesions noted in the visualized portions of the skeleton. IMPRESSION: 1. The appearance the chest is most suggestive of multilobar bilateral bronchopneumonia with bilateral parapneumonic pleural effusions, as above. 2. Given the nodular appearance of some of the areas of apparent airspace consolidation, follow-up noncontrast chest CT is recommended in 3 months to ensure resolution of the nodular areas following treatment for the patient's acute illness, to exclude underlying neoplasm. 3. Multiple renal lesions bilaterally, incompletely characterized on today's noncontrast CT examination, including an indeterminate 1.9 cm lesion in the anterior aspect of the interpolar region of the left kidney. Follow-up nonemergent outpatient abdominal MRI with and without IV gadolinium is recommended in the near future to definitively evaluate this lesion and exclude neoplasm. 4. Cholelithiasis. 5. Nonobstructive calculi in the right renal  collecting system measuring up to 3 mm. 6. Ascites. 7. Aortic atherosclerosis. Aortic Atherosclerosis (ICD10-I70.0). Electronically Signed   By: Vinnie Langton M.D.   On: 05/16/2022 05:23   DG Chest Port 1 View  Result Date: 05/16/2022 CLINICAL DATA:  Shortness of breath EXAM: PORTABLE CHEST 1 VIEW COMPARISON:  05/07/2022 FINDINGS: Cardiac shadow is enlarged but stable. Small pleural effusion on the right is again noted. Some increased basilar density is noted on the right when compared with the prior study. Postsurgical changes in the left breast are seen. No bony abnormality is noted. IMPRESSION: Persistent right-sided effusion with slight increase in basilar infiltrate on the right. Electronically Signed   By: Inez Catalina M.D.   On: 05/16/2022 03:45    Assessment  72 y.o. female with a history of COPD, RA, CKD Stage IV who was admitted with pneumonia and worsening renal function 05/16/22 after recent discharge from OSH on 05/12/22. GI consulted for further evaluation of worsening anemia and rectal bleeding.   Anemia, duodenal ulcer with perforation: Colonoscopy performe d1/20 with melena in the TI and colon therefore EGD performed with small non bleeding ucler in the gastric fundus and multiple duodenal ulcers, 2 of which had adherent clots that were removed and revealed 2 vessels. One vessel with active bleeding s/p control therapy. Hgb dropped to 6.3 during admission and she received 3 units of prbc. Hgb initially improved to 9.1.  Patient began to have pain again 1/23 in her epigastric region with CXR and KUB without findings concerning for perforationand had a large episode of hematochezia yesterday 1/25 with drop in Hgb to 4.3. CTA angio on revealed presence of intraperitoneal gas and fluid concerning for visceral perforation likely coming form first or second portion of duodenum without active source of bleeding. Due to worsening abdominal distention and abdominal pain Dr. Jenetta Downer requested  evaluation by Dr. Constance Haw and overnight she underwent exploratory laparotomy with finding of sealed duodenal perforation with omental patch and drain placement. She remained intubated overnight and transferred to the ICU. She was transfused 3 additional units yesterday and Hgb 10.5 this morning. She remains intubated this morning on exam but will nod Y/N to questions. NGT in place with minimal bilious output. Discussed with patient that she will remain NPO and keep NGT and will receive IV nutrition and will repeat imaging prior to beginning feeds. Patient not on  sedation but drowsy and will follow up with her after extubation to discuss plan again with her.    Plan / Recommendations  Continue to monitor H/H, transfuse Hgb <7 PPI IV BID Keep NPO, NGT to remain in place Agree with general surgery recommendations to tpn and antibiotics Agree with UGI series prior to beginning any feeds    LOS: 12 days    05/28/2022, 8:55 AM   Venetia Night, MSN, FNP-BC, AGACNP-BC [facility]

## 2022-05-28 NOTE — Progress Notes (Signed)
9PHARMACY - TOTAL PARENTERAL NUTRITION CONSULT NOTE   Indication:  perforate ulcer, unable to meet nutritional needs orally/enterally  Patient Measurements: Height: '4\' 11"'$  (149.9 cm) Weight: 57.2 kg (126 lb 1.7 oz) IBW/kg (Calculated) : 43.2 TPN AdjBW (KG): 46.7 Body mass index is 25.47 kg/m. Usual Weight: 57kg  Assessment: 72 year old female originally admitted with pneumonia and AKI on 1/14. Admission complicated with anemia requiring colonoscopy and EGG. Pain returned 1/25, intraperitoneal gas and fluid on CT 1/25 concerning for perforation. Patient went for emergent exlap overnight and omental patch repair was done. Patient now intubated in ICU. Patient will remain npo and likely need TPN for a week per surgery.   Glucose / Insulin: CBGs 100-127, no insulin required Electrolytes: NA 135, K 3.5, Ca 6.6 (corrected ca 8.1) Renal: scr 1.7 stable Hepatic: Tbili 0.6, AST 24, ALT 6 Intake / Output; MIVF: none GI Imaging: ABD [place] 1/23 CT 1/25 GI Surgeries / Procedures:  Colonoscopy 1/20 EGD 1/20 Emergent Ex-lap with omental patch 1/26  Central access: CVC placed 1/26 TPN start date: 1/26  Nutritional Goals: Goal TPN rate is 80 mL/hr (provides 92 g of protein and 1797 kcals per day)  RD Assessment: Estimated Needs Total Energy Estimated Needs: 1800-1900 Total Protein Estimated Needs: 90-98 gr Total Fluid Estimated Needs: 1000 ml plus urine output  Current Nutrition:  NPO  Plan:  Start TPN at 40m/hr at 1800 Electrolytes in TPN: Na 572m/L, K 5089mL, Ca 5mE7m, Mg 5mEq70m and Phos 15mmo30m Cl:Ac 1:1 -Will supplement potassium and magnesium outside of TPN Add standard MVI, folic acid, and trace elements to TPN Initiate Sensitive q8h SSI and adjust as needed  Holding off on MIVF due to patient being ESRD and having difficulty removing fluid due to blood pressure Monitor TPN labs on Mon/Thurs, will follow over weekend given new start.   Giles Currie Erin HearingD., BCPS Clinical  Pharmacist 05/28/2022 7:38 AM

## 2022-05-28 NOTE — Progress Notes (Signed)
[facility]   Patient did not want me to call family post op tonight. She said they could be notified in the AM.   PRN For and sedation Vent, ABG CXR  NPO, NG Binder  JP drain  Protonix Will need UGI before feeds, will wait 5 full days, will be NPO Wednesday 1/31  TPN to start tomorrow Zosyn and diflucan for purulent ascites, cultures obtained Receiving 3rd unit PRBC Labs ordered for after blood Updated team.  Curlene Labrum, MD [facility] [address] [place], [place] [address] [phone] (office)

## 2022-05-28 NOTE — Progress Notes (Signed)
Nutrition Follow up  DOCUMENTATION CODES:     INTERVENTION:  TNA per pharmacy  NUTRITION DIAGNOSIS:  Inadequate oral intake r/t inability to take nutrition by mouth AEB NPO status, rectal bleeding  GOAL:   (Patient will comply with Kidney diet)- once diet is advanced   MONITOR:  Labs, I & O's, Supplement acceptance  REASON FOR ASSESSMENT:  Consult -New TNA/TPN  ASSESSMENT: Patient is a 72 yo female with hx of COPD, RA, anemia, ESRD- new HD. Presented with shortness of breath on 1/14 (Chest x-ray- PE) and PNA. Hospitalized at [facility] 1/5->1/10 COPD exacerbation, HCAP, AKI.   Procedures: 1/16 Hemodialysis right IJ HD catheter insertion 1/17 [facility] for renal biopsy 1/22 [facility]-IR Placement of 19 cm Palindrome TDC   Per Chart: 1/19 Hemoglobin 6.1 and fecal occult -positive. Patient received 3 units PRBC's.   Patient has been on NPO or clear liquids most of this hospitalization LOS day 9. Nausea and epigastric pain this morning. Patient had EKG done and given Fentanyl for pain.   She is not feeling well during RD visit to provide kidney diet education. Her eyes were closed most of the time but says, "I'm listening." Handouts provided noted above and RD contact provided if she has questions when she is feeling more like participating.    If patient continues to be unable to advance diet consider alternate nutrition support measures.  1/26 Patient with nausea and epigastric pain, severe ulceration in stomach per MD. Guaiac positive stools. Esophagitis and gastritis. TPN being initiated due to patient inability to meet nutritional needs orally/enterally.  Patient on admission 57.2 kg. Currently 52.8 kg. First hemodialysis treatment on 05/18/22.   Medications: reviewed. Phoslo, Prednisone, sodium bicarbonate, sucralfate with meals. Albumin prn     Latest Ref Rng & Units 05/28/2022    8:03 AM 05/28/2022    7:14 AM 05/27/2022    4:20 AM  BMP  Glucose 70 - 99 mg/dL 97  98  94   BUN 8 - 23  mg/dL 21  22  33   Creatinine 0.44 - 1.00 mg/dL 1.71  1.76  2.98   Sodium 135 - 145 mmol/L 135  135  136   Potassium 3.5 - 5.1 mmol/L 3.5  3.4  4.5   Chloride 98 - 111 mmol/L 101  101  99   CO2 22 - 32 mmol/L '23  24  26   '$ Calcium 8.9 - 10.3 mg/dL 6.6  6.6  7.4       Diet Order:   Diet Order     None       EDUCATION NEEDS:  Education needs have been addressed  Skin:  Skin Assessment: Reviewed RN Assessment  Last BM:  1/25 diarrhea  Height:   Ht Readings from Last 1 Encounters:  05/16/22 '4\' 11"'$  (1.499 m)    Weight:   Wt Readings from Last 1 Encounters:  05/28/22 57.2 kg    Ideal Body Weight:   45 kg  BMI:  Body mass index is 25.47 kg/m.  Estimated Nutritional Needs:   Kcal:  1800-1900  Protein:  90-98 gr  Fluid:  1000 ml plus urine output   Colman Cater MS,RD,CSG,LDN Contact: AMION

## 2022-05-29 DIAGNOSIS — K659 Peritonitis, unspecified: Secondary | ICD-10-CM | POA: Diagnosis not present

## 2022-05-29 DIAGNOSIS — J189 Pneumonia, unspecified organism: Secondary | ICD-10-CM | POA: Diagnosis not present

## 2022-05-29 DIAGNOSIS — D62 Acute posthemorrhagic anemia: Secondary | ICD-10-CM | POA: Diagnosis not present

## 2022-05-29 DIAGNOSIS — N179 Acute kidney failure, unspecified: Secondary | ICD-10-CM | POA: Diagnosis not present

## 2022-05-29 LAB — RENAL FUNCTION PANEL
Albumin: 2 g/dL — ABNORMAL LOW (ref 3.5–5.0)
Anion gap: 11 (ref 5–15)
BUN: 42 mg/dL — ABNORMAL HIGH (ref 8–23)
CO2: 24 mmol/L (ref 22–32)
Calcium: 7.3 mg/dL — ABNORMAL LOW (ref 8.9–10.3)
Chloride: 100 mmol/L (ref 98–111)
Creatinine, Ser: 2.55 mg/dL — ABNORMAL HIGH (ref 0.44–1.00)
GFR, Estimated: 19 mL/min — ABNORMAL LOW (ref >60)
Glucose, Bld: 141 mg/dL — ABNORMAL HIGH (ref 70–99)
Phosphorus: 3.2 mg/dL (ref 2.5–4.6)
Potassium: 4.5 mmol/L (ref 3.5–5.1)
Sodium: 135 mmol/L (ref 135–145)

## 2022-05-29 LAB — HEMOGLOBIN AND HEMATOCRIT, BLOOD
HCT: 29.5 % — ABNORMAL LOW (ref 36.0–46.0)
HCT: 24.6 % — ABNORMAL LOW (ref 36.0–46.0)
HCT: 20.8 % — ABNORMAL LOW (ref 36.0–46.0)
HCT: 24.1 % — ABNORMAL LOW (ref 36.0–46.0)
Hemoglobin: 9.8 g/dL — ABNORMAL LOW (ref 12.0–15.0)
Hemoglobin: 8.1 g/dL — ABNORMAL LOW (ref 12.0–15.0)
Hemoglobin: 6.8 g/dL — CL (ref 12.0–15.0)
Hemoglobin: 7.7 g/dL — ABNORMAL LOW (ref 12.0–15.0)

## 2022-05-29 LAB — GLUCOSE, CAPILLARY
Glucose-Capillary: 144 mg/dL — ABNORMAL HIGH (ref 70–99)
Glucose-Capillary: 145 mg/dL — ABNORMAL HIGH (ref 70–99)
Glucose-Capillary: 165 mg/dL — ABNORMAL HIGH (ref 70–99)
Glucose-Capillary: 203 mg/dL — ABNORMAL HIGH (ref 70–99)

## 2022-05-29 LAB — MAGNESIUM: Magnesium: 2 mg/dL (ref 1.7–2.4)

## 2022-05-29 LAB — TRIGLYCERIDES: Triglycerides: 137 mg/dL (ref <150)

## 2022-05-29 MED ORDER — INSULIN ASPART 100 UNIT/ML IJ SOLN
0.0000 [IU] | Freq: Three times a day (TID) | INTRAMUSCULAR | Status: DC
  Administered 2022-05-29: 2 [IU] via SUBCUTANEOUS
  Administered 2022-05-30 (×2): 1 [IU] via SUBCUTANEOUS

## 2022-05-29 MED ORDER — TRAVASOL 10 % IV SOLN
INTRAVENOUS | Status: DC
  Filled 2022-05-29: qty 921.6

## 2022-05-29 MED ORDER — SODIUM CHLORIDE 0.9% IV SOLUTION: Freq: Once | INTRAVENOUS | Status: DC

## 2022-05-29 NOTE — Progress Notes (Signed)
Date and time results received: 05/29/22 1809 (use smartphrase ".now" to insert current time)  Test: Hemoglobin Critical Value: 6.8  Name of Provider Notified: Tat, MD.  Orders Received? Or Actions Taken?: Orders Received - See Orders for details

## 2022-05-29 NOTE — Progress Notes (Signed)
[facility]  Extubated yesterday. Doing fair. Dialysis now.   BP (!) 108/43   Pulse (!) 107   Temp 98.6 F (37 C) (Rectal)   Resp (!) 21   Ht '4\' 11"'$  (1.499 m)   Wt 57.9 kg   SpO2 96%   BMI 25.78 kg/m  Midline with honeycomb dressing and no erythema or drainage, small area of dried blood, and JP with SS output.   Patient s/p Ex lap, omental patch, JP drain for perforated duodenal ulcer. H&H drifting down some but no major bloody Bms.  NPO, NG TPN UGI on Wednesday 1/31  Cultures intraabdominal not growing anything yet, zosyn and diflucan for 5 days post op; can end on 1/30   Foley and Arterial line can likely come out, discussing with Dr. Carles Collet.  Updated her brother, Sheri Cooper.  Curlene Labrum, MD [facility] [address] [place], [place] [address] [phone] (office)

## 2022-05-29 NOTE — TOC Progression Note (Signed)
Transition of Care [facility]) - Progression Note    Patient Details  Name: Sheri Cooper MRN: [id number] Date of Birth: 11-04-1950  [facility]) CM/SW Contact  Boneta Lucks, RN Phone Number: 05/29/2022, 2:08 PM  Clinical Narrative:  Patient not ready for discharge.  Will be 4 to 5 days before getting orals.  Called [facility] to cancel Tues 1/30 appointment.  She was reschedule for 2/6 at 10:30. They ask they we call later this week to confirm. TOC following.    Expected Discharge Plan: Home/Self Care Barriers to Discharge: Continued Medical Work up  Expected Discharge Plan and Services     Post Acute Care Choice: Dialysis Living arrangements for the past 2 months: Apartment                   Social Determinants of Health (SDOH) Interventions SDOH Screenings   Food Insecurity: No Food Insecurity (05/19/2022)  Housing: Low Risk  (05/19/2022)  Transportation Needs: No Transportation Needs (05/19/2022)  Utilities: Not At Risk (05/19/2022)  Tobacco Use: Medium Risk (05/27/2022)    Readmission Risk Interventions    05/18/2022   11:51 AM  Readmission Risk Prevention Plan  Transportation Screening Complete  PCP or Specialist Appt within 5-7 Days Not Complete  Home Care Screening Complete  Medication Review (RN CM) Complete

## 2022-05-29 NOTE — Progress Notes (Addendum)
PHARMACY - TOTAL PARENTERAL NUTRITION CONSULT NOTE   Indication:  perforate ulcer, unable to meet nutritional needs orally/enterally  Patient Measurements: Height: '4\' 11"'$  (149.9 cm) Weight: 57.9 kg (127 lb 10.3 oz) IBW/kg (Calculated) : 43.2 TPN AdjBW (KG): 46.7 Body mass index is 25.78 kg/m. Usual Weight: 57kg  Assessment: 72 year old female originally admitted with pneumonia and AKI on 1/14. Admission complicated with anemia requiring colonoscopy and EGG. Pain returned 1/25, intraperitoneal gas and fluid on CT 1/25 concerning for perforation. Patient went for emergent exlap overnight and omental patch repair was done. Patient now intubated in ICU. Patient will remain npo and likely need TPN for a week per surgery.   Glucose / Insulin: CBGs 118-144, no insulin required. Noted on steroids Electrolytes: CoCa = 8.9, Po4 3.2, Mg 2 Renal: on IHD TTS Hepatic: TG wnl Intake / Output; MIVF: 1220/1105, none GI Imaging: ABD [place] 1/23 CT 1/25 GI Surgeries / Procedures:  Colonoscopy 1/20 EGD 1/20 Emergent Ex-lap with omental patch 1/26  Central access: CVC placed 1/26 TPN start date: 1/26  Nutritional Goals: Goal TPN rate is 80 mL/hr (provides 92 g of protein and 1797 kcals per day)  RD Assessment: Estimated Needs Total Energy Estimated Needs: 1800-1900 Total Protein Estimated Needs: 90-98 gr Total Fluid Estimated Needs: 1000 ml plus urine output  Current Nutrition:  NPO  Plan:  Increase TPN to 62m/hr at 1800 Electrolytes in TPN: Na 532m/L, K 5021mL, Ca 5mE19m, Mg 5mEq43m and Phos 15mmo81m Cl:Ac 1:1 -Will supplement potassium and magnesium outside of TPN when needed Add standard MVI, folic acid, and trace elements to TPN Change Sensitive q8h SSI and adjust as needed  Holding off on MIVF due to patient being ESRD and having difficulty removing fluid due to blood pressure Monitor TPN labs on Mon/Thurs  ElviraLorenso CouriermD Clinical Pharmacist 05/29/2022 8:23  AM

## 2022-05-29 NOTE — Procedures (Signed)
Hemodialysis Note:  3.5 hour hemodialysis treatment completed as ordered. Unable to obtain ultrafiltration (fluid removal) goal due to decreased blood pressures. Nephrologist ordered to stop fluid removal for remainder of treatment. Net fluid removed -12m and blood volume processed 84 L. Vitals signs stable post treatment and patient remained asymptomatic throughout treatment. Hemodialysis catheter WNL and antimicrobial disc in place. Arterial and venous lumens flushed, heparin locked with 1.643m clamped, and dead end caps are in place.

## 2022-05-29 NOTE — Progress Notes (Signed)
PROGRESS NOTE  Sheri Cooper FVC:[id number] DOB: November 06, 1950 DOA: 05/16/2022 PCP: [facility]   Brief History:   72 y.o. female with medical history significant of COPD, rheumatoid arthritis, stage V CKD. Admitted on 05/16/2022 with healthcare associated pneumonia and worsening renal function with anion gap metabolic acidosis.  -Patient was recently discharged from [facility] on 05/12/2022 after treatment for COPD.  Nephrology was consulted.  Her hospitalization was prolonged secondary to HCAP and progressive renal failure.  She finished intravenous cefepime on 05/20/2022.  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  She was started on steroids by nephrology.  Pt was started on hemodialysis on 05/18/22 after temporary HD cath placement and had renal biopsy done on 05/19/22.  On 05/24/2022, tunneled dialysis catheter was placed.  An outpatient dialysis center was ultimately found for the patient.  Her first outpatient dialysis will be scheduled on 06/01/2022. Her hospitalization was prolonged secondary to GI bleed.  She was noted to have melena and hematochezia.  She was transfused 3 units total for the hospitalization.  GI was consulted.  On 05/22/2022, the patient underwent EGD and colonoscopy.  Results are discussed below.  Her hemoglobin stabilized.   On the evening of 05/27/22, pt had another large maroon stool.  Pt remains hemodynamically stable.  Case was discussed with GI, Dr. Jenetta Downer.  Case also discussed with nephrology.  It was felt that the benefit was greater than the risk of obtaining CTA of the abdomen and pelvis.   CTA showed intraperitoneal gas and fluid concerning for visceral perforation.  Based upon the location it was felt that it was likely coming from the first-second portion of the duodenum.  There is no acute bleeding.  General surgery was consulted.  Dr. Constance Haw took the patient to the OR for exploratory laparotomy.  Omental patch  repair was done.  The patient was left intubated overnight.   Assessment/Plan:   AKI on CKD 3b -ANCA, ANA, anti-GBM neg, C3/C4 WNL. HIV, Hep B neg. FLC within acceptable limits. SPEP pending  -Anion gap metabolic acidosis noted and sodium bicarbonate -patient sees Dr. Theador Hawthorne as outpatient---discussed with Dr. Lars Masson was concerned that patient would need hemodialysis this admission which has now been started -Care Everywhere [facility] from [facility] reviewed -appreciate [facility] follow up -first HD 05/18/22 -Right IJ HD catheter placed by Dr. 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   -continue steroids for now>> convert IV Solu-Medrol until able to take p.o. -Last HD 05/29/22   Pneumoperitoneum/Peritonitis -Noted on CTA abdomen 05/27/2022 -Location likely in the first-second portion of the duodenum -05/27/22 Exploratory laparotomy with omental patch -Remain n.p.o. and start TPN per general surgery -Convert essential medications to IV if possible -continue zosyn and fluconazole   Acute respiratory failure with hypoxia -Patient left intubated postoperatively -Consult pulmonary for management -extubated 1/27   Guaiac Positive Stools / GI bleeding/ ABLA Bleeding duodenal ulcers s/p clip x1, epinephrine and coagulation treatments Esophagitis and Gastritis  -large bloody BM noted by RN guaiac positive on 1/18 -DC'd subcutaneous heparin on 1/18 -transfused 1 unit PRBC with HD on 1/18, Hg down again to 6.1, transfused 2 unit PRBC 1/19 with HD  -GI team consulted to eval 1/19 -transfused 2 units PRBC with HD  1/19, Hg up to 9.8 -EGD / Colonoscopy planned for 05/22/22 with Dr. Jenetta Downer - with findings of grade C esophagitis and gastritis with ulcers,  multiple duodenal ulcers with  adherent blood clots, bleeding vessel clip x 1, epinephrine injection given and APC; however second vessel could not be clipped. Currently on PP  -initially finished 72 hours IV pantoprazole drip>>IV bid -05/22/21 colonoscopy-blood in entire colon  -05/27/22--had another large maroon stool -05/27/2022 CT abdomen--no active GI bleed, but pneumoperitoneum found -GI reconsulted -Transfused 3 units PRBC (6 units total for admission) 1/25-1/26   COPD  -- stable  -continue bronchodilators -Pt is on high dose steroids per nephrology for AKI   Hypokalemia -repleted orally  -recheck in AM   Thrombocytopenia - resolved now - we stopped [place] heparin on 1/18 - checking HIT antibody labs - pending results - platelets improved to 145 after stopping all heparins  - "pharmacy on HIT alert" order placed   Incidental finding of multiple renal lesions CT  of chest showed multiple renal lesions bilaterally, incompletely characterized on today's noncontrast CT examination, including an indeterminate 1.9 cm lesion in the anterior aspect of the interpolar region of the left kidney.  Follow-up nonemergent outpatient abdominal MRI with and without IV gadolinium is recommended in the near future to definitively evaluate this lesion and exclude neoplasm   Elevated BNP--echo from 05/10/20[facility] at [facility] available in [place] reveals-  The left ventricle is normal in size with normal wall thickness. The left ventricular systolic function is normal, LVEF is visually estimated at 60-65%.  There is normal left ventricular diastolic function.  -There is mild to moderate pulmonary hypertension no significant valvular abnormalities -hemodialysis to manage volume status   Diarrhea - resolved now;  - c diff testing was negative              Family Communication:   brother updated   Consultants:  GI, renal, general surgery   Code Status:  FULL    DVT Prophylaxis:  [place]  Heparin / [place] Lovenox     Procedures: As Listed in Progress Note Above   Antibiotics: Zosyn 1/26>> Fluconzole 1/26>>         Subjective: Pt complains of abd pain.  Denies f/c, cp, sob, vomiting.  No flatus, no BM  Objective: Vitals:   05/29/22 1550 05/29/22 1551 05/29/22 1609 05/29/22 1632  BP:  (!) 168/40    Pulse:  94  97  Resp:  19  19  Temp:  98.7 F (37.1 C) 98.7 F (37.1 C) 98.2 F (36.8 C)  TempSrc:    Oral  SpO2:  95%  95%  Weight: 58 kg     Height:        Intake/Output Summary (Last 24 hours) at 05/29/2022 1727 Last data filed at 05/29/2022 1719 Gross per 24 hour  Intake 1701.58 ml  Output 275 ml  Net 1426.58 ml   Weight change: 5.9 kg Exam:  General:  Pt is alert, follows commands appropriately, not in acute distress HEENT: No icterus, No thrush, No neck mass, [place]/AT Cardiovascular: RRR, S1/S2, no rubs, no gallops Respiratory: bibasilar rales. No wheeze Abdomen: Soft/+BS, diffuse tender, non distended, no guarding Extremities: No edema, No lymphangitis, No petechiae, No rashes, no synovitis   Data Reviewed: I have personally reviewed following labs and imaging studies Basic Metabolic Panel: Recent Labs  Lab 05/23/22 0414 05/24/22 0439 05/25/22 0404 05/26/22 0410 05/27/22 0420 05/28/22 0714 05/28/22 0803 05/29/22 0508  NA 135 133*   < >  135 136 135 135 135  K 3.6 2.9*   < > 3.6 4.5 3.4* 3.5 4.5  CL 101 99   < > 99 99 101 101 100  CO2 24 24   < > '24 26 24 23 24  '$ GLUCOSE 108* 78   < > 64* 94 98 97 141*  BUN 69* 35*   < > 19 33* 22 21 42*  CREATININE 3.54* 2.54*   < > 2.00* 2.98* 1.76* 1.71* 2.55*  CALCIUM 7.4* 7.1*   < > 7.1* 7.4* 6.6* 6.6* 7.3*  MG 2.2 2.0  --   --   --   --  1.5* 2.0  PHOS 4.5 2.8   < > 2.5 2.6 3.1 3.1 3.2   < > = values in this interval not displayed.   Liver Function Tests: Recent Labs  Lab 05/26/22 0410 05/27/22 0420 05/28/22 0714 05/28/22 0803 05/29/22 0508  AST  --   --   --  24  --   ALT  --   --   --  6   --   ALKPHOS  --   --   --  70  --   BILITOT  --   --   --  0.6  --   PROT  --   --   --  4.7*  --   ALBUMIN 1.9* 1.9* 2.2* 2.1* 2.0*   Recent Labs  Lab 05/25/22 0404  LIPASE 41   No results for input(s): "AMMONIA" in the last 168 hours. Coagulation Profile: No results for input(s): "INR", "PROTIME" in the last 168 hours. CBC: Recent Labs  Lab 05/24/22 0439 05/25/22 0404 05/26/22 0410 05/27/22 0420 05/27/22 1831 05/28/22 0714 05/28/22 1208 05/28/22 1746 05/28/22 2258 05/29/22 0508 05/29/22 1117  WBC 29.7* 20.4* 28.8* 24.3*  --  18.6*  --   --   --   --   --   HGB 8.2* 9.1* 8.4* 8.0*   < > 10.5* 10.3* 9.6* 10.2* 9.8* 8.1*  HCT 24.3* 27.9* 26.0* 24.6*   < > 31.7* 30.9* 29.1* 30.6* 29.5* 24.6*  MCV 85.9 86.9 88.1 86.6  --  81.1  --   --   --   --   --   PLT 140* 146* 165 203  --  147*  --   --   --   --   --    < > = values in this interval not displayed.   Cardiac Enzymes: No results for input(s): "CKTOTAL", "CKMB", "CKMBINDEX", "TROPONINI" in the last 168 hours. BNP: Invalid input(s): "POCBNP" CBG: Recent Labs  Lab 05/28/22 1121 05/28/22 2018 05/29/22 0022 05/29/22 0557 05/29/22 1106  GLUCAP 118* 121* 145* 144* 165*   HbA1C: No results for input(s): "HGBA1C" in the last 72 hours. Urine analysis:    Component Value Date/Time   COLORURINE YELLOW 05/28/2022 1005   APPEARANCEUR CLOUDY (A) 05/28/2022 1005   LABSPEC 1.018 05/28/2022 1005   PHURINE 7.0 05/28/2022 1005   GLUCOSEU NEGATIVE 05/28/2022 1005   HGBUR SMALL (A) 05/28/2022 1005   BILIRUBINUR NEGATIVE 05/28/2022 1005   KETONESUR NEGATIVE 05/28/2022 1005   PROTEINUR >=300 (A) 05/28/2022 1005   NITRITE NEGATIVE 05/28/2022 1005   LEUKOCYTESUR MODERATE (A) 05/28/2022 1005   Sepsis Labs: '@LABRCNTIP'$ (procalcitonin:4,lacticidven:4) ) Recent Results (from the past 240 hour(s))  Aerobic/Anaerobic Culture w Gram Stain (surgical/deep wound)     Status: None (Preliminary result)   Collection Time: 05/27/22  11:45 PM   Specimen: PATH Cytology Peritoneal fluid; Body  Fluid  Result Value Ref Range Status   Specimen Description   Final    PERITONEAL FLUID Performed at [facility], [address], [place], [place] [address]    Special Requests   Final    NONE Performed at [facility], [address] [place], [place] [address]    Gram Stain   Final    RARE WBC PRESENT, PREDOMINANTLY PMN NO ORGANISMS SEEN    Culture   Final    FEW ENTEROCOCCUS FAECIUM SUSCEPTIBILITIES TO FOLLOW Performed at [facility], [place] [address] [place], [place] [address]    Report Status PENDING  Incomplete  Urine Culture (for pregnant, neutropenic or urologic patients or patients with an indwelling urinary catheter)     Status: Abnormal (Preliminary result)   Collection Time: 05/28/22 10:05 AM   Specimen: Urine, Catheterized  Result Value Ref Range Status   Specimen Description   Final    URINE, CATHETERIZED Performed at [facility], [address], [place], [place] [address]    Special Requests   Final    NONE Performed at [facility], [address], [place], [place] [address]    Culture (A)  Final    >=100,000 COLONIES/mL ENTEROCOCCUS FAECIUM SUSCEPTIBILITIES TO FOLLOW Performed at [facility] [address], [place], [place] [address]    Report Status PENDING  Incomplete     Scheduled Meds:  Chlorhexidine Gluconate Cloth  6 each Topical Daily   Chlorhexidine Gluconate Cloth  6 each Topical Q0600   [START ON 06/01/2022] darbepoetin (ARANESP) injection - DIALYSIS  200 mcg Subcutaneous Q Tue-1800   hydrALAZINE  5 mg Intravenous Q8H   insulin aspart  0-6 Units Subcutaneous Q8H   methylPREDNISolone (SOLU-MEDROL) injection  40 mg Intravenous Daily   pantoprazole  40 mg Intravenous Q12H   Continuous Infusions:  albumin human     fluconazole (DIFLUCAN) IV Stopped (05/29/22 0439)   piperacillin-tazobactam (ZOSYN)  IV 100 mL/hr at 05/29/22 1719   TPN ADULT (ION) Stopped (05/29/22 1706)    TPN ADULT (ION) 80 mL/hr at 05/29/22 1719    Procedures/Studies: DG Chest Port 1 View  Result Date: 05/28/2022 CLINICAL DATA:  Respiratory failure EXAM: PORTABLE CHEST 1 VIEW COMPARISON:  05/25/2022 FINDINGS: Endotracheal tube is seen 3.5 cm above the carina. Nasogastric tube extends into the upper abdomen beyond the margin of the examination. Left internal jugular central venous catheter tip within the superior vena cava. Right internal jugular hemodialysis catheter tip noted within the right atrium. The lungs are symmetrically well expanded. Partial left lower lobe collapse noted, however, with retrocardiac consolidation. Mild relative opacification the right hemithorax suggest the presence of a posteriorly layering pleural effusion on this supine examination. Superimposed trace perihilar interstitial pulmonary infiltrate is in keeping with trace probable interstitial pulmonary edema. No pneumothorax. Cardiac size is within normal limits. IMPRESSION: 1. Support lines and tubes in appropriate position. 2. Partial left lower lobe collapse. 3. Suspected posteriorly layering right pleural effusion. 4. Trace probable interstitial pulmonary edema. Electronically Signed   By: Fidela Salisbury M.D.   On: 05/28/2022 03:32   CT ANGIO GI BLEED  Result Date: 05/27/2022 CLINICAL DATA:  Recurrent gastrointestinal hemorrhage EXAM: CTA ABDOMEN AND PELVIS WITHOUT AND WITH CONTRAST TECHNIQUE: Multidetector CT imaging of the abdomen and pelvis was performed using the standard protocol during bolus administration of intravenous contrast. Multiplanar reconstructed images and MIPs were obtained and reviewed to evaluate the vascular anatomy. RADIATION DOSE REDUCTION: This exam was performed according to the  departmental dose-optimization program which includes automated exposure control, adjustment of the mA and/or kV according to patient size and/or use of iterative reconstruction technique. CONTRAST:  53m OMNIPAQUE IOHEXOL  350 MG/ML SOLN COMPARISON:  None Available. FINDINGS: VASCULAR Aorta: Moderate mixed atherosclerotic plaque. No aortic aneurysm or dissection. No periaortic inflammatory change identified. Celiac: Patent without evidence of aneurysm, dissection, vasculitis or significant stenosis. SMA: Patent without evidence of aneurysm, dissection, vasculitis or significant stenosis. Renals: High-grade (near occlusion) stenosis of the a left renal artery origin. Less than 50% stenosis of the right renal artery origin. Normal vascular morphology. Relatively diminutive caliber of the left renal arterial vasculature distal the stenosis may relate to under filling. No aneurysm or dissection. IMA: Occluded at its origin with reconstitution via the marginal artery. 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 Focal irregular minimum 50% stenosis of the left common iliac artery secondary to a short segment dissection flap within the proximal left common iliac artery. Right internal iliac artery is heavily diseased and occluded at its origin with subsequent reconstitution. High-grade focal stenosis of the left internal iliac artery at its origin. Proximal Outflow: Bilateral common femoral and visualized portions of the superficial and profunda femoral arteries are patent without evidence of aneurysm, dissection, vasculitis or significant stenosis. Veins: Mesenteric, splenic, portal venous, and hepatic venous structures are patent. Iliofemoral veins are patent. Renal veins and inferior vena cava are patent. Review of the MIP images confirms the above findings. NON-VASCULAR Lower chest: Small left and moderate right pleural effusions are present with bibasilar compressive atelectasis. Hepatobiliary: Cholelithiasis without pericholecystic inflammatory change. Liver unremarkable. No intra or extrahepatic biliary ductal dilation. Pancreas: Unremarkable Spleen: Unremarkable Adrenals/Urinary Tract: The adrenal  glands are unremarkable. Moderate left renal cortical atrophy. Right kidney is normal in size. 6 mm plaque-like nonobstructing calculus noted within the upper pole the right kidney. Multiple simple cortical cysts are seen within the right kidney. Hypodense 16 mm lesion is seen within the interpolar region of the right kidney demonstrates no significant enhancement is compatible with a Bosniak class 2 cyst. No follow-up imaging is recommended for these lesions. No hydronephrosis. The bladder is unremarkable. Stomach/Bowel: There is mild free intraperitoneal gas present. The exact site of visceral perforation is not clearly identified on this examination, however, there is extraluminal gas and debris surrounding the first and proximal second portion of the duodenum raising the question of a perforation in this location. Endoscopic clip noted within the juncture of the first and second portion of the duodenum. Moderate free intraperitoneal fluid. Endoscopic clip laying dependently within the cecum. The stomach, small bowel, and large bowel are otherwise unremarkable. No active gastrointestinal hemorrhage identified. Fluid within the descending colon and rectal vault may present clinically as diarrhea. Lymphatic: No pathologic adenopathy within the abdomen and pelvis. Reproductive: Involuted fibroid within the uterus. The pelvic organs are otherwise unremarkable. Other: Moderate subcutaneous body wall edema noted dependently. Musculoskeletal: No acute bone abnormality. No lytic or blastic bone lesion. IMPRESSION: VASCULAR . 1. 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 2. 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 Focal irregular minimum 50% stenosis of the left common iliac artery secondary to a short segment dissection flap. 3. 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 High-grade focal stenosis of the left internal iliac artery at its origin. 4. 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 NON-VASCULAR . 1. Moderate free intraperitoneal gas and fluid in keeping with visceral perforation. The exact site is not clearly identified on this examination, however, more focal debris and gas adjacent to the juncture of the first and second portion of the duodenum raises the question of a perforation in this location. 2. No active gastrointestinal hemorrhage identified 3. Cholelithiasis. 4. Mild right nonobstructing nephrolithiasis. 5. Fluid within the descending colon and rectal vault may present clinically as diarrhea. Aortic Atherosclerosis (ICD10-I70.0). Electronically Signed   By: Fidela Salisbury M.D.   On: 05/27/2022 21:51   DG CHEST PORT 1 VIEW  Result Date: 05/25/2022 CLINICAL DATA:  Chest pain EXAM: PORTABLE CHEST 1 VIEW COMPARISON:  05/22/2022 FINDINGS: Right internal jugular approach hemodialysis catheter terminates at the level of the right atrium. Stable heart size. Small right pleural effusion with associated right basilar opacity, similar to prior. Similar prominent interstitial markings bilaterally. No pneumothorax. IMPRESSION: 1. Small right pleural effusion with associated right basilar opacity, similar to prior. 2. Similar prominent interstitial markings bilaterally, likely mild edema. Electronically Signed   By: Davina Poke D.O.   On: 05/25/2022 11:39   DG Abd 1 View  Result Date: 05/25/2022 CLINICAL DATA:  Abdominal pain EXAM: ABDOMEN - 1 VIEW COMPARISON:  05/22/2022 FINDINGS: The bowel gas pattern is normal. No radio-opaque calculi or other significant radiographic abnormality are seen. Surgical clips overlie the pelvis and right upper quadrant. Calcification consistent with fibroid noted. IMPRESSION: Unremarkable bowel gas pattern. Calcified fibroid incidentally noted Electronically  Signed   By: Sammie Bench M.D.   On: 05/25/2022 08:22   IR Fluoro Guide CV Line Right  Result Date: 05/24/2022 INDICATION: 72 year old female in need of durable dialysis access. She presents for removal temporary dialysis catheter in placement of a tunneled dialysis catheter. EXAM: TUNNELED CENTRAL VENOUS HEMODIALYSIS CATHETER PLACEMENT WITH ULTRASOUND AND FLUOROSCOPIC GUIDANCE MEDICATIONS: 2 g Ancef. The antibiotic was given in an appropriate time interval prior to skin puncture. ANESTHESIA/SEDATION: Moderate (conscious) sedation was employed during this procedure. A total of Versed 1 mg and Fentanyl 25 mcg was administered intravenously. Moderate Sedation Time: 11 minutes. The patient's level of consciousness and vital signs were monitored continuously by radiology nursing throughout the procedure under my direct supervision. FLUOROSCOPY TIME:  Radiation exposure index: 1 mGy reference air kerma COMPLICATIONS: None immediate. PROCEDURE: Informed written consent was obtained from the patient after a discussion of the risks, benefits, and alternatives to treatment. Questions regarding the procedure were encouraged and answered. The right neck and chest were prepped with chlorhexidine in a sterile fashion, and a sterile drape was applied covering the operative field. Maximum barrier sterile technique with sterile gowns and gloves were used for the procedure. A timeout was performed prior to the initiation of the procedure. After creating a small venotomy incision, a micropuncture kit was utilized to access the right internal jugular vein under direct, real-time ultrasound guidance after the overlying soft tissues were anesthetized with 1% lidocaine with epinephrine. Ultrasound image documentation was performed. The microwire was kinked to measure appropriate catheter length. A stiff Glidewire was advanced to the level of the IVC and the micropuncture sheath was exchanged for a peel-away sheath. A palindrome  tunneled hemodialysis catheter measuring 19 cm from tip to cuff was tunneled in a retrograde fashion from the anterior chest wall to the venotomy incision. The catheter was then placed through the peel-away sheath with tips ultimately positioned within the superior aspect of the  right atrium. Final catheter positioning was confirmed and documented with a spot radiographic image. The catheter aspirates and flushes normally. The catheter was flushed with appropriate volume heparin dwells. The catheter exit site was secured with a 0-Prolene retention suture. The venotomy incision was closed with an interrupted 4-0 Vicryl, Dermabond and Steri-strips. Dressings were applied. The patient tolerated the procedure well without immediate post procedural complication. IMPRESSION: Successful placement of 19 cm tip to cuff tunneled hemodialysis catheter via the right internal jugular vein with tips terminating within the superior aspect of the right atrium. The catheter is ready for immediate use. Electronically Signed   By: Jacqulynn Cadet M.D.   On: 05/24/2022 12:39   IR US Guide Vasc Access Right  Result Date: 05/24/2022 INDICATION: 72 year old female in need of durable dialysis access. She presents for removal temporary dialysis catheter in placement of a tunneled dialysis catheter. EXAM: TUNNELED CENTRAL VENOUS HEMODIALYSIS CATHETER PLACEMENT WITH ULTRASOUND AND FLUOROSCOPIC GUIDANCE MEDICATIONS: 2 g Ancef. The antibiotic was given in an appropriate time interval prior to skin puncture. ANESTHESIA/SEDATION: Moderate (conscious) sedation was employed during this procedure. A total of Versed 1 mg and Fentanyl 25 mcg was administered intravenously. Moderate Sedation Time: 11 minutes. The patient's level of consciousness and vital signs were monitored continuously by radiology nursing throughout the procedure under my direct supervision. FLUOROSCOPY TIME:  Radiation exposure index: 1 mGy reference air kerma COMPLICATIONS:  None immediate. PROCEDURE: Informed written consent was obtained from the patient after a discussion of the risks, benefits, and alternatives to treatment. Questions regarding the procedure were encouraged and answered. The right neck and chest were prepped with chlorhexidine in a sterile fashion, and a sterile drape was applied covering the operative field. Maximum barrier sterile technique with sterile gowns and gloves were used for the procedure. A timeout was performed prior to the initiation of the procedure. After creating a small venotomy incision, a micropuncture kit was utilized to access the right internal jugular vein under direct, real-time ultrasound guidance after the overlying soft tissues were anesthetized with 1% lidocaine with epinephrine. Ultrasound image documentation was performed. The microwire was kinked to measure appropriate catheter length. A stiff Glidewire was advanced to the level of the IVC and the micropuncture sheath was exchanged for a peel-away sheath. A palindrome tunneled hemodialysis catheter measuring 19 cm from tip to cuff was tunneled in a retrograde fashion from the anterior chest wall to the venotomy incision. The catheter was then placed through the peel-away sheath with tips ultimately positioned within the superior aspect of the right atrium. Final catheter positioning was confirmed and documented with a spot radiographic image. The catheter aspirates and flushes normally. The catheter was flushed with appropriate volume heparin dwells. The catheter exit site was secured with a 0-Prolene retention suture. The venotomy incision was closed with an interrupted 4-0 Vicryl, Dermabond and Steri-strips. Dressings were applied. The patient tolerated the procedure well without immediate post procedural complication. IMPRESSION: Successful placement of 19 cm tip to cuff tunneled hemodialysis catheter via the right internal jugular vein with tips terminating within the superior  aspect of the right atrium. The catheter is ready for immediate use. Electronically Signed   By: Jacqulynn Cadet M.D.   On: 05/24/2022 12:39   DG CHEST PORT 1 VIEW  Result Date: 05/22/2022 CLINICAL DATA:  Evaluate for intra-abdominal free air. EXAM: PORTABLE CHEST 1 VIEW COMPARISON:  Abdominal radiograph 05/22/2022 FINDINGS: Central venous catheter tip projects over the superior vena cava. Stable cardiac and mediastinal contours. Small  layering right pleural effusion with underlying consolidation. No pneumothorax. No definite free air no definite gas identified underlying the hemidiaphragms. IMPRESSION: 1. Small layering right pleural effusion with underlying consolidation. 2. No definite free air identified underlying the hemidiaphragms. Consider further evaluation with decubitus imaging of the abdomen if there is persistent clinical concern given the limitations of this portable semi-erect chest radiograph. Electronically Signed   By: Lovey Newcomer M.D.   On: 05/22/2022 16:02   DG Abd 1 View  Result Date: 05/22/2022 CLINICAL DATA:  Postoperative abdominal pain. EXAM: ABDOMEN - 1 VIEW COMPARISON:  None Available. FINDINGS: Gas is demonstrated within nondilated loops of large and small bowel in a nonobstructed pattern. Supine evaluation limited for the detection of free intraperitoneal air. Lumbar spine degenerative changes. Calcific density projecting over the pelvis, nonspecific, potentially calcified uterine fibroid. IMPRESSION: Nonobstructed bowel-gas pattern. Electronically Signed   By: Lovey Newcomer M.D.   On: 05/22/2022 14:08   DG CHEST PORT 1 VIEW  Result Date: 05/21/2022 CLINICAL DATA:  Leukocytosis EXAM: PORTABLE CHEST 1 VIEW COMPARISON:  05/18/2022 FINDINGS: Central venous line tip in distal SVC. Normal cardiac silhouette. Small RIGHT effusion. Mild venous congestion. No pneumothorax. No focal consolidation. Lymphadenectomy clips in the LEFT axilla. No significant interval change. IMPRESSION: 1.  No significant interval change. 2. Small RIGHT effusion. 3. Mild venous congestion. Electronically Signed   By: Suzy Bouchard M.D.   On: 05/21/2022 08:22   US BIOPSY (KIDNEY)  Result Date: 05/19/2022 INDICATION: 72 year old female with history of end-stage renal disease. EXAM: ULTRASOUND GUIDED RENAL BIOPSY COMPARISON:  None Available. MEDICATIONS: None. ANESTHESIA/SEDATION: Fentanyl 25 mcg IV; Versed 0.5 mg IV Total Moderate Sedation time: 10 minutes; The patient was continuously monitored during the procedure by the interventional radiology nurse under my direct supervision. COMPLICATIONS: None immediate. PROCEDURE: Informed written consent was obtained from the patient after a discussion of the risks, benefits and alternatives to treatment. The patient understands and consents the procedure. A timeout was performed prior to the initiation of the procedure. Ultrasound scanning was performed of the bilateral flanks. The inferior pole of the left kidney was selected for biopsy due to location and sonographic window. The procedure was planned. The operative site was prepped and draped in the usual sterile fashion. The overlying soft tissues were anesthetized with 1% lidocaine with epinephrine. A 17 gauge coaxial introducer needle was advanced into the inferior cortex of the left kidney and 2 core biopsies were obtained under direct ultrasound guidance with a 16 gauge core biopsy device. Images were saved for documentation purposes. The biopsy device was removed and hemostasis was obtained with manual compression after injection of Gel-Foam slurry along the needle track under ultrasound guidance. Post procedural scanning was negative for significant post procedural hemorrhage or additional complication. A dressing was placed. The patient tolerated the procedure well without immediate post procedural complication. IMPRESSION: Technically successful ultrasound guided left renal biopsy. Ruthann Cancer, MD [facility] Electronically Signed   By: Ruthann Cancer M.D.   On: 05/19/2022 14:19   DG Chest Port 1 View  Result Date: 05/18/2022 CLINICAL DATA:  Central line placement. EXAM: PORTABLE CHEST 1 VIEW COMPARISON:  CT chest and chest x-ray from 2 days ago. FINDINGS: New right internal jugular central venous catheter with tip at the cavoatrial junction. The heart size and mediastinal contours are within normal limits. Mild diffuse interstitial thickening similar. Decreasing small right and unchanged small left pleural effusions. Increasing asymmetric density in the peripheral  left upper lobe. Similar atelectasis in the peripheral right middle lobe. Improving right lower lobe atelectasis. No pneumothorax. No acute osseous abnormality. IMPRESSION: 1. New right internal jugular central venous catheter with tip at the cavoatrial junction. No pneumothorax. 2. Similar diffuse interstitial thickening with increasing asymmetric density in the peripheral left upper lobe. Differential considerations include infection or pulmonary edema. 3. Decreased small right and unchanged small left pleural effusions. Electronically Signed   By: Titus Dubin M.D.   On: 05/18/2022 14:06   US RENAL  Result Date: 05/18/2022 CLINICAL DATA:  Chronic kidney disease EXAM: RENAL / URINARY TRACT ULTRASOUND COMPLETE COMPARISON:  Abdomen CT report 01/13/2018 FINDINGS: Right Kidney: Renal measurements: 8.9 x 4.7 x 4.4 = volume: 97 mL. Kidney appears echogenic. No collecting system dilatation. There is a simple cyst along the midportion of the kidney which is anechoic with through transmission measuring 17 mm. Left Kidney: Renal measurements: 7.5 x 3.9 x 3.2 = volume: 49 mL. Small echogenic appearing kidney. No collecting system dilatation. Anechoic rounded structure seen in the mid kidney measuring 2 cm. Through transmission is smoothly marginated. Bladder: Appears normal for degree of bladder  distention. Other: Scattered ascites.  Probable right-sided pleural effusion. IMPRESSION: Echogenic small appearing kidneys. No collecting system dilatation. Bilateral simple appearing renal cysts. Ascites.  Right pleural effusion Electronically Signed   By: Jill Side M.D.   On: 05/18/2022 11:07   CT Chest Wo Contrast  Result Date: 05/16/2022 CLINICAL DATA:  71 year old female with history of suspected pneumonia. EXAM: CT CHEST WITHOUT CONTRAST TECHNIQUE: Multidetector CT imaging of the chest was performed following the standard protocol without IV contrast. RADIATION DOSE REDUCTION: This exam was performed according to the departmental dose-optimization program which includes automated exposure control, adjustment of the mA and/or kV according to patient size and/or use of iterative reconstruction technique. COMPARISON:  Chest x-ray 05/16/2022. FINDINGS: Cardiovascular: Heart size is normal. There is no significant pericardial fluid, thickening or pericardial calcification. Aortic atherosclerosis. No definite coronary artery calcifications. Mild calcifications of the aortic valve. Mediastinum/Nodes: No pathologically enlarged mediastinal or hilar lymph nodes. Please note that accurate exclusion of hilar adenopathy is limited on noncontrast CT scans. Esophagus is unremarkable in appearance. No axillary lymphadenopathy. Lungs/Pleura: Moderate to large right and small to moderate left pleural effusions lying dependently associated with areas of passive subsegmental atelectasis in the lower lobes of the lungs bilaterally. In addition, there are mid to upper lung predominant patchy areas of ground-glass attenuation and nodular appearing consolidative airspace disease, likely to reflect multilobar bilateral bronchopneumonia. The largest of these nodular appearing regions in the apex of the left upper lobe (axial image 32 of series 4) measuring 1.3 x 1.2 cm. Upper Abdomen: Aortic atherosclerosis. Tiny partially  calcified gallstones lying dependently in the fundus of the gallbladder. Small volume of ascites. Multiple renal lesions bilaterally, incompletely characterized on today's noncontrast CT examination, most notable for an exophytic intermediate attenuation lesion (25 HU) measuring 1.9 cm in the anterior aspect of the interpolar region of the left kidney. A cluster of nonobstructive calculi is also noted in the posterior aspect of the upper pole collecting system of the right kidney measuring up to 3 mm. Musculoskeletal: There are no aggressive appearing lytic or blastic lesions noted in the visualized portions of the skeleton. IMPRESSION: 1. The appearance the chest is most suggestive of multilobar bilateral bronchopneumonia with bilateral parapneumonic pleural effusions, as above. 2. Given the nodular appearance of some of the areas of apparent airspace consolidation, follow-up noncontrast chest  CT is recommended in 3 months to ensure resolution of the nodular areas following treatment for the patient's acute illness, to exclude underlying neoplasm. 3. Multiple renal lesions bilaterally, incompletely characterized on today's noncontrast CT examination, including an indeterminate 1.9 cm lesion in the anterior aspect of the interpolar region of the left kidney. Follow-up nonemergent outpatient abdominal MRI with and without IV gadolinium is recommended in the near future to definitively evaluate this lesion and exclude neoplasm. 4. Cholelithiasis. 5. Nonobstructive calculi in the right renal collecting system measuring up to 3 mm. 6. Ascites. 7. Aortic atherosclerosis. Aortic Atherosclerosis (ICD10-I70.0). Electronically Signed   By: Vinnie Langton M.D.   On: 05/16/2022 05:23   DG Chest Port 1 View  Result Date: 05/16/2022 CLINICAL DATA:  Shortness of breath EXAM: PORTABLE CHEST 1 VIEW COMPARISON:  05/07/2022 FINDINGS: Cardiac shadow is enlarged but stable. Small pleural effusion on the right is again noted. Some  increased basilar density is noted on the right when compared with the prior study. Postsurgical changes in the left breast are seen. No bony abnormality is noted. IMPRESSION: Persistent right-sided effusion with slight increase in basilar infiltrate on the right. Electronically Signed   By: Inez Catalina M.D.   On: 05/16/2022 03:45    Orson Eva, DO  [facility]  If 7PM-7AM, please contact night-coverage [email or web address] Password [email or web address] 05/29/2022, 5:27 PM   LOS: 13 days

## 2022-05-29 NOTE — Progress Notes (Signed)
Patient has no complaints this morning.  Wants to go home clinically, no further GI bleeding.  Vital signs in last 24 hours: Temp:  [97.6 F (36.4 C)-98.9 F (37.2 C)] 98.9 F (37.2 C) (01/27 0435) Pulse Rate:  [76-93] 85 (01/27 0800) Resp:  [6-20] 18 (01/27 0800) BP: (126-183)/(30-46) 153/36 (01/27 0800) SpO2:  [91 %-99 %] 93 % (01/27 0800) Arterial Line BP: (145-204)/(39-51) 187/50 (01/27 0800) FiO2 (%):  [35 %-40 %] 35 % (01/26 1105) Weight:  [57.9 kg-60.1 kg] 57.9 kg (01/27 0435) Last BM Date : 05/27/22 General:   Alert,    NG in place., pleasant and cooperative in NAD Abdomen:    Abdominal dressing in place.  Intake/Output from previous day: 01/26 0701 - 01/27 0700 In: 1220.8 [I.V.:438.8; IV Piggyback:782.1] Out: 115 [Drains:115] Intake/Output this shift: Total I/O In: 84.4 [I.V.:67.4; IV Piggyback:17] Out: -   Lab Results: Recent Labs    05/27/22 0420 05/27/22 1831 05/28/22 0714 05/28/22 1208 05/28/22 1746 05/28/22 2258 05/29/22 0508  WBC 24.3*  --  18.6*  --   --   --   --   HGB 8.0*   < > 10.5*   < > 9.6* 10.2* 9.8*  HCT 24.6*   < > 31.7*   < > 29.1* 30.6* 29.5*  PLT 203  --  147*  --   --   --   --    < > = values in this interval not displayed.   BMET Recent Labs    05/28/22 0714 05/28/22 0803 05/29/22 0508  NA 135 135 135  K 3.4* 3.5 4.5  CL 101 101 100  CO2 '24 23 24  '$ GLUCOSE 98 97 141*  BUN 22 21 42*  CREATININE 1.76* 1.71* 2.55*  CALCIUM 6.6* 6.6* 7.3*   LFT Recent Labs    05/28/22 0803 05/29/22 0508  PROT 4.7*  --   ALBUMIN 2.1* 2.0*  AST 24  --   ALT 6  --   ALKPHOS 70  --   BILITOT 0.6  --    PT/INR No results for input(s): "LABPROT", "INR" in the last 72 hours. Hepatitis Panel No results for input(s): "HEPBSAG", "HCVAB", "HEPAIGM", "HEPBIGM" in the last 72 hours. C-Diff No results for input(s): "CDIFFTOX" in the last 72 hours.  Studies/Results: DG Chest Port 1 View  Result Date: 05/28/2022 CLINICAL DATA:  Respiratory  failure EXAM: PORTABLE CHEST 1 VIEW COMPARISON:  05/25/2022 FINDINGS: Endotracheal tube is seen 3.5 cm above the carina. Nasogastric tube extends into the upper abdomen beyond the margin of the examination. Left internal jugular central venous catheter tip within the superior vena cava. Right internal jugular hemodialysis catheter tip noted within the right atrium. The lungs are symmetrically well expanded. Partial left lower lobe collapse noted, however, with retrocardiac consolidation. Mild relative opacification the right hemithorax suggest the presence of a posteriorly layering pleural effusion on this supine examination. Superimposed trace perihilar interstitial pulmonary infiltrate is in keeping with trace probable interstitial pulmonary edema. No pneumothorax. Cardiac size is within normal limits. IMPRESSION: 1. Support lines and tubes in appropriate position. 2. Partial left lower lobe collapse. 3. Suspected posteriorly layering right pleural effusion. 4. Trace probable interstitial pulmonary edema. Electronically Signed   By: Fidela Salisbury M.D.   On: 05/28/2022 03:32   CT ANGIO GI BLEED  Result Date: 05/27/2022 CLINICAL DATA:  Recurrent gastrointestinal hemorrhage EXAM: CTA ABDOMEN AND PELVIS WITHOUT AND WITH CONTRAST TECHNIQUE: Multidetector CT imaging of the abdomen and pelvis was performed using the  standard protocol during bolus administration of intravenous contrast. Multiplanar reconstructed images and MIPs were obtained and reviewed to evaluate the vascular anatomy. RADIATION DOSE REDUCTION: This exam was performed according to the departmental dose-optimization program which includes automated exposure control, adjustment of the mA and/or kV according to patient size and/or use of iterative reconstruction technique. CONTRAST:  56m OMNIPAQUE IOHEXOL 350 MG/ML SOLN COMPARISON:  None Available. FINDINGS: VASCULAR Aorta: Moderate mixed atherosclerotic plaque. No aortic aneurysm or dissection. No  periaortic inflammatory change identified. Celiac: Patent without evidence of aneurysm, dissection, vasculitis or significant stenosis. SMA: Patent without evidence of aneurysm, dissection, vasculitis or significant stenosis. Renals: High-grade (near occlusion) stenosis of the a left renal artery origin. Less than 50% stenosis of the right renal artery origin. Normal vascular morphology. Relatively diminutive caliber of the left renal arterial vasculature distal the stenosis may relate to under filling. No aneurysm or dissection. IMA: Occluded at its origin with reconstitution via the marginal artery. 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 Focal irregular minimum 50% stenosis of the left common iliac artery secondary to a short segment dissection flap within the proximal left common iliac artery. Right internal iliac artery is heavily diseased and occluded at its origin with subsequent reconstitution. High-grade focal stenosis of the left internal iliac artery at its origin. Proximal Outflow: Bilateral common femoral and visualized portions of the superficial and profunda femoral arteries are patent without evidence of aneurysm, dissection, vasculitis or significant stenosis. Veins: Mesenteric, splenic, portal venous, and hepatic venous structures are patent. Iliofemoral veins are patent. Renal veins and inferior vena cava are patent. Review of the MIP images confirms the above findings. NON-VASCULAR Lower chest: Small left and moderate right pleural effusions are present with bibasilar compressive atelectasis. Hepatobiliary: Cholelithiasis without pericholecystic inflammatory change. Liver unremarkable. No intra or extrahepatic biliary ductal dilation. Pancreas: Unremarkable Spleen: Unremarkable Adrenals/Urinary Tract: The adrenal glands are unremarkable. Moderate left renal cortical atrophy. Right kidney is normal in size. 6 mm plaque-like nonobstructing calculus noted  within the upper pole the right kidney. Multiple simple cortical cysts are seen within the right kidney. Hypodense 16 mm lesion is seen within the interpolar region of the right kidney demonstrates no significant enhancement is compatible with a Bosniak class 2 cyst. No follow-up imaging is recommended for these lesions. No hydronephrosis. The bladder is unremarkable. Stomach/Bowel: There is mild free intraperitoneal gas present. The exact site of visceral perforation is not clearly identified on this examination, however, there is extraluminal gas and debris surrounding the first and proximal second portion of the duodenum raising the question of a perforation in this location. Endoscopic clip noted within the juncture of the first and second portion of the duodenum. Moderate free intraperitoneal fluid. Endoscopic clip laying dependently within the cecum. The stomach, small bowel, and large bowel are otherwise unremarkable. No active gastrointestinal hemorrhage identified. Fluid within the descending colon and rectal vault may present clinically as diarrhea. Lymphatic: No pathologic adenopathy within the abdomen and pelvis. Reproductive: Involuted fibroid within the uterus. The pelvic organs are otherwise unremarkable. Other: Moderate subcutaneous body wall edema noted dependently. Musculoskeletal: No acute bone abnormality. No lytic or blastic bone lesion. IMPRESSION: VASCULAR . 1. 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 2. 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 Focal irregular minimum 50% stenosis of the left common iliac artery secondary to a short segment dissection flap. 3. 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 High-grade focal stenosis of the left internal iliac artery at its origin. 4. 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 NON-VASCULAR . 1. Moderate free intraperitoneal gas and fluid in keeping with visceral perforation. The exact site is not clearly identified on this examination, however, more focal debris and gas adjacent to the juncture of the first and second portion of the duodenum raises the question of a perforation in this location. 2. No active gastrointestinal hemorrhage identified 3. Cholelithiasis. 4. Mild right nonobstructing nephrolithiasis. 5. Fluid within the descending colon and rectal vault may present clinically as diarrhea. Aortic Atherosclerosis (ICD10-I70.0). Electronically Signed   By: Fidela Salisbury M.D.   On: 05/27/2022 21:51     Impression:   72 year old lady about 2 days out from emergency exploratory laparotomy with omental patch for perforated duodenal ulcer. No further GI bleeding.    Hemoglobin stable. She seems to be stable postop.  TPN initiated yesterday. Bump in creatinine ; dialysis planned for today according to nursing staff.  Recommendations: Agree with nutritional,  Renal support.  Periodic check H&H;  twice daily PPI x 3 months;  decrease to once daily in 3 months We will contemplate a surveillance EGD later in the year. Empiric H. pylori treatment just after discharge  I did discuss the gravity of her situation at the bedside this morning and explained to her that she can anticipate being in the hospital at least several more days.    We will continue to follow peripherally.

## 2022-05-30 ENCOUNTER — Other Ambulatory Visit: Payer: Self-pay

## 2022-05-30 DIAGNOSIS — K631 Perforation of intestine (nontraumatic): Secondary | ICD-10-CM

## 2022-05-30 DIAGNOSIS — A419 Sepsis, unspecified organism: Secondary | ICD-10-CM | POA: Insufficient documentation

## 2022-05-30 DIAGNOSIS — K668 Other specified disorders of peritoneum: Secondary | ICD-10-CM | POA: Diagnosis not present

## 2022-05-30 DIAGNOSIS — J189 Pneumonia, unspecified organism: Secondary | ICD-10-CM | POA: Diagnosis not present

## 2022-05-30 DIAGNOSIS — D62 Acute posthemorrhagic anemia: Secondary | ICD-10-CM | POA: Diagnosis not present

## 2022-05-30 LAB — CBC
HCT: 24.5 % — ABNORMAL LOW (ref 36.0–46.0)
Hemoglobin: 7.7 g/dL — ABNORMAL LOW (ref 12.0–15.0)
MCH: 23.8 pg — ABNORMAL LOW (ref 26.0–34.0)
MCHC: 31.4 g/dL (ref 30.0–36.0)
MCV: 75.9 fL — ABNORMAL LOW (ref 80.0–100.0)
Platelets: 132 10*3/uL — ABNORMAL LOW (ref 150–400)
RBC: 3.23 MIL/uL — ABNORMAL LOW (ref 3.87–5.11)
RDW: 23 % — ABNORMAL HIGH (ref 11.5–15.5)
WBC: 17.8 10*3/uL — ABNORMAL HIGH (ref 4.0–10.5)
nRBC: 0.5 % — ABNORMAL HIGH (ref 0.0–0.2)

## 2022-05-30 LAB — HEMOGLOBIN AND HEMATOCRIT, BLOOD
HCT: 25.1 % — ABNORMAL LOW (ref 36.0–46.0)
HCT: 24.5 % — ABNORMAL LOW (ref 36.0–46.0)
HCT: 22.9 % — ABNORMAL LOW (ref 36.0–46.0)
Hemoglobin: 7.9 g/dL — ABNORMAL LOW (ref 12.0–15.0)
Hemoglobin: 7.8 g/dL — ABNORMAL LOW (ref 12.0–15.0)
Hemoglobin: 7.3 g/dL — ABNORMAL LOW (ref 12.0–15.0)

## 2022-05-30 LAB — GLUCOSE, CAPILLARY
Glucose-Capillary: 109 mg/dL — ABNORMAL HIGH (ref 70–99)
Glucose-Capillary: 152 mg/dL — ABNORMAL HIGH (ref 70–99)
Glucose-Capillary: 168 mg/dL — ABNORMAL HIGH (ref 70–99)
Glucose-Capillary: 224 mg/dL — ABNORMAL HIGH (ref 70–99)
Glucose-Capillary: 224 mg/dL — ABNORMAL HIGH (ref 70–99)

## 2022-05-30 LAB — URINE CULTURE: Culture: >=100000 — AB

## 2022-05-30 LAB — PHOSPHORUS: Phosphorus: 2.3 mg/dL — ABNORMAL LOW (ref 2.5–4.6)

## 2022-05-30 LAB — COMPREHENSIVE METABOLIC PANEL
ALT: 5 U/L (ref 0–44)
AST: 17 U/L (ref 15–41)
Albumin: 1.7 g/dL — ABNORMAL LOW (ref 3.5–5.0)
Alkaline Phosphatase: 61 U/L (ref 38–126)
Anion gap: 11 (ref 5–15)
BUN: 60 mg/dL — ABNORMAL HIGH (ref 8–23)
CO2: 25 mmol/L (ref 22–32)
Calcium: 6.7 mg/dL — ABNORMAL LOW (ref 8.9–10.3)
Chloride: 99 mmol/L (ref 98–111)
Creatinine, Ser: 1.88 mg/dL — ABNORMAL HIGH (ref 0.44–1.00)
GFR, Estimated: 28 mL/min — ABNORMAL LOW (ref >60)
Glucose, Bld: 146 mg/dL — ABNORMAL HIGH (ref 70–99)
Potassium: 4.6 mmol/L (ref 3.5–5.1)
Sodium: 135 mmol/L (ref 135–145)
Total Bilirubin: 0.3 mg/dL (ref 0.3–1.2)
Total Protein: 4 g/dL — ABNORMAL LOW (ref 6.5–8.1)

## 2022-05-30 LAB — TRIGLYCERIDES: Triglycerides: 151 mg/dL — ABNORMAL HIGH (ref <150)

## 2022-05-30 LAB — MAGNESIUM: Magnesium: 1.7 mg/dL (ref 1.7–2.4)

## 2022-05-30 MED ORDER — TRAVASOL 10 % IV SOLN
INTRAVENOUS | Status: AC
  Filled 2022-05-30 (×2): qty 921.6

## 2022-05-30 MED ORDER — LINEZOLID 600 MG/300ML IV SOLN
600.0000 mg | Freq: Two times a day (BID) | INTRAVENOUS | Status: DC
  Administered 2022-05-30 – 2022-06-04 (×12): 600 mg via INTRAVENOUS
  Filled 2022-05-30 (×19): qty 300

## 2022-05-30 MED ORDER — TRAVASOL 10 % IV SOLN
INTRAVENOUS | Status: AC
  Filled 2022-05-30: qty 960

## 2022-05-30 MED ORDER — TRAVASOL 10 % IV SOLN: INTRAVENOUS | Status: DC

## 2022-05-30 MED ORDER — MAGNESIUM SULFATE 2 GM/50ML IV SOLN
2.0000 g | Freq: Once | INTRAVENOUS | Status: AC
  Administered 2022-05-30: 2 g via INTRAVENOUS
  Filled 2022-05-30: qty 50

## 2022-05-30 MED ORDER — INSULIN ASPART 100 UNIT/ML IJ SOLN
0.0000 [IU] | Freq: Three times a day (TID) | INTRAMUSCULAR | Status: DC
  Administered 2022-05-30: 3 [IU] via SUBCUTANEOUS
  Administered 2022-05-31: 2 [IU] via SUBCUTANEOUS
  Administered 2022-05-31: 3 [IU] via SUBCUTANEOUS
  Administered 2022-05-31: 5 [IU] via SUBCUTANEOUS
  Administered 2022-06-01: 2 [IU] via SUBCUTANEOUS
  Administered 2022-06-01 (×2): 3 [IU] via SUBCUTANEOUS
  Administered 2022-06-02 (×2): 2 [IU] via SUBCUTANEOUS
  Administered 2022-06-03: 1 [IU] via SUBCUTANEOUS
  Administered 2022-06-03 (×2): 2 [IU] via SUBCUTANEOUS
  Administered 2022-06-04: 1 [IU] via SUBCUTANEOUS

## 2022-05-30 NOTE — Plan of Care (Signed)
  Problem: Education: Goal: Knowledge of General Education information will improve Description: Including pain rating scale, medication(s)/side effects and non-pharmacologic comfort measures Outcome: Progressing   Problem: Health Behavior/Discharge Planning: Goal: Ability to manage health-related needs will improve Outcome: Progressing   Problem: Clinical Measurements: Goal: Ability to maintain clinical measurements within normal limits will improve Outcome: Progressing Goal: Will remain free from infection Outcome: Progressing Goal: Diagnostic test results will improve Outcome: Progressing Goal: Respiratory complications will improve Outcome: Progressing Goal: Cardiovascular complication will be avoided Outcome: Progressing   Problem: Activity: Goal: Risk for activity intolerance will decrease Outcome: Progressing   Problem: Nutrition: Goal: Adequate nutrition will be maintained Outcome: Progressing   Problem: Coping: Goal: Level of anxiety will decrease Outcome: Progressing   Problem: Elimination: Goal: Will not experience complications related to bowel motility Outcome: Progressing Goal: Will not experience complications related to urinary retention Outcome: Progressing   Problem: Pain Managment: Goal: General experience of comfort will improve Outcome: Progressing   Problem: Safety: Goal: Ability to remain free from injury will improve Outcome: Progressing   Problem: Skin Integrity: Goal: Risk for impaired skin integrity will decrease Outcome: Progressing   Problem: Activity: Goal: Ability to tolerate increased activity will improve Outcome: Progressing   Problem: Clinical Measurements: Goal: Ability to maintain a body temperature in the normal range will improve Outcome: Progressing   Problem: Respiratory: Goal: Ability to maintain adequate ventilation will improve Outcome: Progressing Goal: Ability to maintain a clear airway will improve Outcome:  Progressing   Problem: Education: Goal: Knowledge of disease and its progression will improve Outcome: Progressing Goal: Individualized Educational Video(s) Outcome: Progressing   Problem: Fluid Volume: Goal: Compliance with measures to maintain balanced fluid volume will improve Outcome: Progressing   Problem: Health Behavior/Discharge Planning: Goal: Ability to manage health-related needs will improve Outcome: Progressing   Problem: Nutritional: Goal: Ability to make healthy dietary choices will improve Outcome: Progressing   Problem: Clinical Measurements: Goal: Complications related to the disease process, condition or treatment will be avoided or minimized Outcome: Progressing

## 2022-05-30 NOTE — Progress Notes (Signed)
PROGRESS NOTE  Sheri Cooper OJJ:[id number] DOB: [birth year] DOA: 05/16/2022 PCP: [facility]  Brief History:   72 y.o. female with medical history significant of COPD, rheumatoid arthritis, stage V CKD. Admitted on 05/16/2022 with healthcare associated pneumonia and worsening renal function with anion gap metabolic acidosis.  -Patient was recently discharged from [facility] on 05/12/2022 after treatment for COPD.  Nephrology was consulted.  Her hospitalization was prolonged secondary to HCAP and progressive renal failure.  She finished intravenous cefepime on 05/20/2022.  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  She was started on steroids by nephrology.  Pt was started on hemodialysis on 05/18/22 after temporary HD cath placement and had renal biopsy done on 05/19/22.  On 05/24/2022, tunneled dialysis catheter was placed.  An outpatient dialysis center was ultimately found for the patient.  Her first outpatient dialysis will be scheduled on 06/01/2022. Her hospitalization was prolonged secondary to GI bleed.  She was noted to have melena and hematochezia.  She was transfused 3 units total for the hospitalization.  GI was consulted.  On 05/22/2022, the patient underwent EGD and colonoscopy.  Results are discussed below.  Her hemoglobin stabilized.   On the evening of 05/27/22, pt had another large maroon stool.  Pt remains hemodynamically stable.  Case was discussed with GI, Dr. Jenetta Downer.  Case also discussed with nephrology.  It was felt that the benefit was greater than the risk of obtaining CTA of the abdomen and pelvis.   CTA showed intraperitoneal gas and fluid concerning for visceral perforation.  Based upon the location it was felt that it was likely coming from the first-second portion of the duodenum.  There is no acute bleeding.  General surgery was consulted.  Dr. Constance Haw took the patient to the OR for exploratory laparotomy.  Omental patch repair  was done.  The patient was left intubated overnight. She was extubated on 05/28/22.  She was initially on vasopressors which have been weaned off.  She remained NPO postop, and she was started on TPN.  Assessment/Plan: AKI on CKD 3b -ANCA, ANA, anti-GBM neg, C3/C4 WNL. HIV, Hep B neg. FLC within acceptable limits. SPEP pending  -Anion gap metabolic acidosis noted and sodium bicarbonate -patient sees Dr. Theador Hawthorne as outpatient---discussed with Dr. Lars Masson was concerned that patient would need hemodialysis this admission which has now been started -Care Everywhere [facility] from [facility] reviewed -appreciate [facility] follow up -first HD 05/18/22 -Right IJ HD catheter placed by Dr. 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   -continue steroids for now>> convert IV Solu-Medrol until able to take p.o. -Last HD 05/29/22  Sepsis -secondary to UTI and peritonitis -on vasopressors for brief time>>weaned off -zosyn started for peritonitis -zyvox for VRE in urine   Pneumoperitoneum/Peritonitis -Noted on CTA abdomen 05/27/2022 -Location likely in the first-second portion of the duodenum -05/27/22 Exploratory laparotomy with omental patch -Remain n.p.o. and start TPN per general surgery -Convert essential medications to IV if possible -continue zosyn and fluconazole -E faecium in peritoneal culture>>zyvox added  UTI -UR >50 WBC -urine culture = VRE -zyvox started   Acute respiratory failure with hypoxia -Patient left intubated postoperatively -Consult pulmonary for management -extubated 1/26   Guaiac Positive Stools / GI bleeding/ ABLA  Bleeding duodenal ulcers s/p clip x1, epinephrine and coagulation treatments Esophagitis and Gastritis  -large  bloody BM noted by RN guaiac positive on 1/18 -DC'd subcutaneous heparin on 1/18 -transfused 1 unit PRBC with HD on 1/18, Hg down again to 6.1, transfused 2 unit PRBC 1/19 with HD  -GI team consulted to eval 1/19 -transfused 2 units PRBC with HD 1/19, Hg up to 9.8 -EGD / Colonoscopy planned for 05/22/22 with Dr. Jenetta Downer - with findings of grade C esophagitis and gastritis with ulcers, multiple duodenal ulcers with  adherent blood clots, bleeding vessel clip x 1, epinephrine injection given and APC; however second vessel could not be clipped. Currently on PP  -initially finished 72 hours IV pantoprazole drip>>IV bid -05/22/21 colonoscopy-blood in entire colon  -05/27/22--had another large maroon stool -05/27/2022 CT abdomen--no active GI bleed, but pneumoperitoneum found -GI reconsulted -Transfused 3 units PRBC (6 units total for admission) 1/25-1/26 -1/27 transfuse 1 unit PRBC (7 total)   COPD  -- stable  -continue bronchodilators -Pt is on steroids per nephrology for AKI   Hypokalemia -repleted orally  -improved   Thrombocytopenia  - stopped [place] heparin on 1/18 - checking HIT antibody labs - NEG - likely due to acute medical illness   Incidental finding of multiple renal lesions CT  of chest showed multiple renal lesions bilaterally, incompletely characterized on today's noncontrast CT examination, including an indeterminate 1.9 cm lesion in the anterior aspect of the interpolar region of the left kidney.  Follow-up nonemergent outpatient abdominal MRI with and without IV gadolinium is recommended in the near future to definitively evaluate this lesion and exclude neoplasm   Elevated BNP--echo from [facility]/10/[facility] at [facility] available in [place] reveals-  The left ventricle is normal in size with normal wall thickness. The left ventricular systolic function is normal, LVEF is visually estimated at 60-65%.  There is normal left ventricular diastolic function.  -There is mild to  moderate pulmonary hypertension no significant valvular abnormalities -hemodialysis to manage volume status   Diarrhea - resolved now;  - c diff testing was negative              Family Communication:   brother updated   Consultants:  GI, renal, general surgery   Code Status:  FULL    DVT Prophylaxis:  SCDs     Procedures: As Listed in Progress Note Above   Antibiotics: Zosyn 1/26>> Fluconzole 1/26>> Zyvox 1/28>>   Subjective: Complains of abd pain.  Denies f/c, cp, sob, n/v.  No BM but passing flatus.  Denies headache  Objective: Vitals:   05/30/22 0807 05/30/22 0830 05/30/22 0930 05/30/22 1000  BP:  (!) 169/43 (!) 211/57 (!) 170/39  Pulse: 82 85 94 85  Resp: '12 14 15 15  '$ Temp: 98.5 F (36.9 C)     TempSrc: Rectal     SpO2: 92% 95% 96% 95%  Weight:      Height:        Intake/Output Summary (Last 24 hours) at 05/30/2022 1036 Last data filed at 05/30/2022 2725 Gross per 24 hour  Intake 2429.31 ml  Output 300 ml  Net 2129.31 ml   Weight change: -2.2 kg Exam:  General:  Pt is alert, follows commands appropriately, not in acute distress HEENT: No icterus, No thrush, No neck mass, [place]/AT Cardiovascular: RRR, S1/S2, no rubs, no gallops Respiratory: bibasilar crackles.  No wheeze Abdomen: Soft/+BS, diffuse tender, non distended, no guarding Extremities: No edema, No lymphangitis, No petechiae, No rashes, no synovitis  Data Reviewed: I have personally reviewed following labs and imaging studies Basic Metabolic Panel: Recent Labs  Lab 05/24/22 0439 05/25/22 0404 05/27/22 0420 05/28/22 0714 05/28/22 0803 05/29/22 0508 05/30/22 0500  NA 133*   < > 136 135 135 135 135  K 2.9*   < > 4.5 3.4* 3.5 4.5 4.6  CL 99   < > 99 101 101 100 99  CO2 24   < > '26 24 23 24 25  '$ GLUCOSE 78   < > 94 98 97 141* 146*  BUN 35*   < > 33* 22 21 42* 60*  CREATININE 2.54*   < > 2.98* 1.76* 1.71* 2.55* 1.88*  CALCIUM 7.1*   < > 7.4* 6.6* 6.6* 7.3* 6.7*  MG 2.0  --   --    --  1.5* 2.0 1.7  PHOS 2.8   < > 2.6 3.1 3.1 3.2 2.3*   < > = values in this interval not displayed.   Liver Function Tests: Recent Labs  Lab 05/27/22 0420 05/28/22 0714 05/28/22 0803 05/29/22 0508 05/30/22 0500  AST  --   --  24  --  17  ALT  --   --  6  --  5  ALKPHOS  --   --  70  --  61  BILITOT  --   --  0.6  --  0.3  PROT  --   --  4.7*  --  4.0*  ALBUMIN 1.9* 2.2* 2.1* 2.0* 1.7*   Recent Labs  Lab 05/25/22 0404  LIPASE 41   No results for input(s): "AMMONIA" in the last 168 hours. Coagulation Profile: No results for input(s): "INR", "PROTIME" in the last 168 hours. CBC: Recent Labs  Lab 05/25/22 0404 05/26/22 0410 05/27/22 0420 05/27/22 1831 05/28/22 0714 05/28/22 1208 05/29/22 0508 05/29/22 1117 05/29/22 1744 05/29/22 2306 05/30/22 0751  WBC 20.4* 28.8* 24.3*  --  18.6*  --   --   --   --   --  17.8*  HGB 9.1* 8.4* 8.0*   < > 10.5*   < > 9.8* 8.1* 6.8* 7.7* 7.7*  HCT 27.9* 26.0* 24.6*   < > 31.7*   < > 29.5* 24.6* 20.8* 24.1* 24.5*  MCV 86.9 88.1 86.6  --  81.1  --   --   --   --   --  75.9*  PLT 146* 165 203  --  147*  --   --   --   --   --  132*   < > = values in this interval not displayed.   Cardiac Enzymes: No results for input(s): "CKTOTAL", "CKMB", "CKMBINDEX", "TROPONINI" in the last 168 hours. BNP: Invalid input(s): "POCBNP" CBG: Recent Labs  Lab 05/29/22 1106 05/29/22 1849 05/30/22 0044 05/30/22 0634 05/30/22 0826  GLUCAP 165* 203* 152* 109* 168*   HbA1C: No results for input(s): "HGBA1C" in the last 72 hours. Urine analysis:    Component Value Date/Time   COLORURINE YELLOW 05/28/2022 1005   APPEARANCEUR CLOUDY (A) 05/28/2022 1005   LABSPEC 1.018 05/28/2022 1005   PHURINE 7.0 05/28/2022 1005   GLUCOSEU NEGATIVE 05/28/2022 1005   HGBUR SMALL (A) 05/28/2022 1005   BILIRUBINUR NEGATIVE 05/28/2022 1005   KETONESUR NEGATIVE 05/28/2022 1005   PROTEINUR >=300 (A) 05/28/2022 1005   NITRITE NEGATIVE 05/28/2022 1005   LEUKOCYTESUR  MODERATE (A) 05/28/2022 1005   Sepsis Labs: '@LABRCNTIP'$ (procalcitonin:4,lacticidven:4) ) Recent Results (from the past 240 hour(s))  Aerobic/Anaerobic Culture w Gram Stain (surgical/deep  wound)     Status: None (Preliminary result)   Collection Time: 05/27/22 11:45 PM   Specimen: PATH Cytology Peritoneal fluid; Body Fluid  Result Value Ref Range Status   Specimen Description   Final    PERITONEAL FLUID Performed at [facility], [address], [place], [place] [address]    Special Requests   Final    NONE Performed at [facility], [address] [place], [place] [address]    Gram Stain   Final    RARE WBC PRESENT, PREDOMINANTLY PMN NO ORGANISMS SEEN    Culture   Final    FEW ENTEROCOCCUS FAECIUM SUSCEPTIBILITIES TO FOLLOW Performed at [facility], [place] [address] [place], [place] [address]    Report Status PENDING  Incomplete  Urine Culture (for pregnant, neutropenic or urologic patients or patients with an indwelling urinary catheter)     Status: Abnormal   Collection Time: 05/28/22 10:05 AM   Specimen: Urine, Catheterized  Result Value Ref Range Status   Specimen Description   Final    URINE, CATHETERIZED Performed at [facility], [address], [place], [place] [address]    Special Requests   Final    NONE Performed at [facility], [address] [place], [place] [address]    Culture (A)  Final    >=100,000 COLONIES/mL ENTEROCOCCUS FAECIUM VANCOMYCIN RESISTANT ENTEROCOCCUS ISOLATED    Report Status 05/30/2022 FINAL  Final   Organism ID, Bacteria ENTEROCOCCUS FAECIUM (A)  Final      Susceptibility   Enterococcus faecium - MIC*    AMPICILLIN >=32 RESISTANT Resistant     NITROFURANTOIN 64 INTERMEDIATE Intermediate     VANCOMYCIN >=32 RESISTANT Resistant     LINEZOLID 2 SENSITIVE Sensitive     * >=100,000 COLONIES/mL ENTEROCOCCUS FAECIUM     Scheduled Meds:  sodium chloride   Intravenous Once   Chlorhexidine Gluconate Cloth  6 each Topical Daily    Chlorhexidine Gluconate Cloth  6 each Topical Q0600   [START ON 06/01/2022] darbepoetin (ARANESP) injection - DIALYSIS  200 mcg Subcutaneous Q Tue-1800   hydrALAZINE  5 mg Intravenous Q8H   insulin aspart  0-9 Units Subcutaneous Q8H   methylPREDNISolone (SOLU-MEDROL) injection  40 mg Intravenous Daily   pantoprazole  40 mg Intravenous Q12H   Continuous Infusions:  albumin human     fluconazole (DIFLUCAN) IV 100 mL/hr at 05/30/22 5456   linezolid (ZYVOX) IV     magnesium sulfate bolus IVPB 2 g (05/30/22 1012)   piperacillin-tazobactam (ZOSYN)  IV Stopped (05/30/22 0719)   TPN ADULT (ION)     TPN ADULT (ION)      Procedures/Studies: DG Chest Port 1 View  Result Date: 05/28/2022 CLINICAL DATA:  Respiratory failure EXAM: PORTABLE CHEST 1 VIEW COMPARISON:  05/25/2022 FINDINGS: Endotracheal tube is seen 3.5 cm above the carina. Nasogastric tube extends into the upper abdomen beyond the margin of the examination. Left internal jugular central venous catheter tip within the superior vena cava. Right internal jugular hemodialysis catheter tip noted within the right atrium. The lungs are symmetrically well expanded. Partial left lower lobe collapse noted, however, with retrocardiac consolidation. Mild relative opacification the right hemithorax suggest the presence of a posteriorly layering pleural effusion on this supine examination. Superimposed trace perihilar interstitial pulmonary infiltrate is in keeping with trace probable interstitial pulmonary edema. No pneumothorax. Cardiac size is within normal limits. IMPRESSION: 1. Support lines and tubes in appropriate position. 2. Partial left lower lobe collapse. 3. Suspected posteriorly  layering right pleural effusion. 4. Trace probable interstitial pulmonary edema. Electronically Signed   By: Fidela Salisbury M.D.   On: 05/28/2022 03:32   CT ANGIO GI BLEED  Result Date: 05/27/2022 CLINICAL DATA:  Recurrent gastrointestinal hemorrhage EXAM: CTA ABDOMEN  AND PELVIS WITHOUT AND WITH CONTRAST TECHNIQUE: Multidetector CT imaging of the abdomen and pelvis was performed using the standard protocol during bolus administration of intravenous contrast. Multiplanar reconstructed images and MIPs were obtained and reviewed to evaluate the vascular anatomy. RADIATION DOSE REDUCTION: This exam was performed according to the departmental dose-optimization program which includes automated exposure control, adjustment of the mA and/or kV according to patient size and/or use of iterative reconstruction technique. CONTRAST:  36m OMNIPAQUE IOHEXOL 350 MG/ML SOLN COMPARISON:  None Available. FINDINGS: VASCULAR Aorta: Moderate mixed atherosclerotic plaque. No aortic aneurysm or dissection. No periaortic inflammatory change identified. Celiac: Patent without evidence of aneurysm, dissection, vasculitis or significant stenosis. SMA: Patent without evidence of aneurysm, dissection, vasculitis or significant stenosis. Renals: High-grade (near occlusion) stenosis of the a left renal artery origin. Less than 50% stenosis of the right renal artery origin. Normal vascular morphology. Relatively diminutive caliber of the left renal arterial vasculature distal the stenosis may relate to under filling. No aneurysm or dissection. IMA: Occluded at its origin with reconstitution via the marginal artery. 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 Focal irregular minimum 50% stenosis of the left common iliac artery secondary to a short segment dissection flap within the proximal left common iliac artery. Right internal iliac artery is heavily diseased and occluded at its origin with subsequent reconstitution. High-grade focal stenosis of the left internal iliac artery at its origin. Proximal Outflow: Bilateral common femoral and visualized portions of the superficial and profunda femoral arteries are patent without evidence of aneurysm, dissection, vasculitis or  significant stenosis. Veins: Mesenteric, splenic, portal venous, and hepatic venous structures are patent. Iliofemoral veins are patent. Renal veins and inferior vena cava are patent. Review of the MIP images confirms the above findings. NON-VASCULAR Lower chest: Small left and moderate right pleural effusions are present with bibasilar compressive atelectasis. Hepatobiliary: Cholelithiasis without pericholecystic inflammatory change. Liver unremarkable. No intra or extrahepatic biliary ductal dilation. Pancreas: Unremarkable Spleen: Unremarkable Adrenals/Urinary Tract: The adrenal glands are unremarkable. Moderate left renal cortical atrophy. Right kidney is normal in size. 6 mm plaque-like nonobstructing calculus noted within the upper pole the right kidney. Multiple simple cortical cysts are seen within the right kidney. Hypodense 16 mm lesion is seen within the interpolar region of the right kidney demonstrates no significant enhancement is compatible with a Bosniak class 2 cyst. No follow-up imaging is recommended for these lesions. No hydronephrosis. The bladder is unremarkable. Stomach/Bowel: There is mild free intraperitoneal gas present. The exact site of visceral perforation is not clearly identified on this examination, however, there is extraluminal gas and debris surrounding the first and proximal second portion of the duodenum raising the question of a perforation in this location. Endoscopic clip noted within the juncture of the first and second portion of the duodenum. Moderate free intraperitoneal fluid. Endoscopic clip laying dependently within the cecum. The stomach, small bowel, and large bowel are otherwise unremarkable. No active gastrointestinal hemorrhage identified. Fluid within the descending colon and rectal vault may present clinically as diarrhea. Lymphatic: No pathologic adenopathy within the abdomen and pelvis. Reproductive: Involuted fibroid within the uterus. The pelvic organs are  otherwise unremarkable. Other: Moderate subcutaneous body wall edema noted dependently. Musculoskeletal: No  acute bone abnormality. No lytic or blastic bone lesion. IMPRESSION: VASCULAR . 1. 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 2. 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 Focal irregular minimum 50% stenosis of the left common iliac artery secondary to a short segment dissection flap. 3. 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 High-grade focal stenosis of the left internal iliac artery at its origin. 4. 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 NON-VASCULAR . 1. Moderate free intraperitoneal gas and fluid in keeping with visceral perforation. The exact site is not clearly identified on this examination, however, more focal debris and gas adjacent to the juncture of the first and second portion of the duodenum raises the question of a perforation in this location. 2. No active gastrointestinal hemorrhage identified 3. Cholelithiasis. 4. Mild right nonobstructing nephrolithiasis. 5. Fluid within the descending colon and rectal vault may present clinically as diarrhea. Aortic Atherosclerosis (ICD10-I70.0). Electronically Signed   By: Fidela Salisbury M.D.   On: 05/27/2022 21:51   DG CHEST PORT 1 VIEW  Result Date: 05/25/2022 CLINICAL DATA:  Chest pain EXAM: PORTABLE CHEST 1 VIEW COMPARISON:  05/22/2022 FINDINGS: Right internal jugular approach hemodialysis catheter terminates at the level of the right atrium. Stable heart size. Small right pleural effusion with associated right basilar opacity, similar to prior. Similar prominent interstitial markings bilaterally. No pneumothorax. IMPRESSION: 1. Small right pleural effusion with associated right basilar opacity, similar to prior. 2. Similar  prominent interstitial markings bilaterally, likely mild edema. Electronically Signed   By: Davina Poke D.O.   On: 05/25/2022 11:39   DG Abd 1 View  Result Date: 05/25/2022 CLINICAL DATA:  Abdominal pain EXAM: ABDOMEN - 1 VIEW COMPARISON:  05/22/2022 FINDINGS: The bowel gas pattern is normal. No radio-opaque calculi or other significant radiographic abnormality are seen. Surgical clips overlie the pelvis and right upper quadrant. Calcification consistent with fibroid noted. IMPRESSION: Unremarkable bowel gas pattern. Calcified fibroid incidentally noted Electronically Signed   By: Sammie Bench M.D.   On: 05/25/2022 08:22   IR Fluoro Guide CV Line Right  Result Date: 05/24/2022 INDICATION: 72 year old female in need of durable dialysis access. She presents for removal temporary dialysis catheter in placement of a tunneled dialysis catheter. EXAM: TUNNELED CENTRAL VENOUS HEMODIALYSIS CATHETER PLACEMENT WITH ULTRASOUND AND FLUOROSCOPIC GUIDANCE MEDICATIONS: 2 g Ancef. The antibiotic was given in an appropriate time interval prior to skin puncture. ANESTHESIA/SEDATION: Moderate (conscious) sedation was employed during this procedure. A total of Versed 1 mg and Fentanyl 25 mcg was administered intravenously. Moderate Sedation Time: 11 minutes. The patient's level of consciousness and vital signs were monitored continuously by radiology nursing throughout the procedure under my direct supervision. FLUOROSCOPY TIME:  Radiation exposure index: 1 mGy reference air kerma COMPLICATIONS: None immediate. PROCEDURE: Informed written consent was obtained from the patient after a discussion of the risks, benefits, and alternatives to treatment. Questions regarding the procedure were encouraged and answered. The right neck and chest were prepped with chlorhexidine in a sterile fashion, and a sterile drape was applied covering the operative field. Maximum barrier sterile technique with sterile gowns and gloves were  used for the procedure. A timeout was performed prior to the initiation of the procedure. After creating a small venotomy incision, a micropuncture kit was utilized to access the right internal jugular vein under direct, real-time ultrasound  guidance after the overlying soft tissues were anesthetized with 1% lidocaine with epinephrine. Ultrasound image documentation was performed. The microwire was kinked to measure appropriate catheter length. A stiff Glidewire was advanced to the level of the IVC and the micropuncture sheath was exchanged for a peel-away sheath. A palindrome tunneled hemodialysis catheter measuring 19 cm from tip to cuff was tunneled in a retrograde fashion from the anterior chest wall to the venotomy incision. The catheter was then placed through the peel-away sheath with tips ultimately positioned within the superior aspect of the right atrium. Final catheter positioning was confirmed and documented with a spot radiographic image. The catheter aspirates and flushes normally. The catheter was flushed with appropriate volume heparin dwells. The catheter exit site was secured with a 0-Prolene retention suture. The venotomy incision was closed with an interrupted 4-0 Vicryl, Dermabond and Steri-strips. Dressings were applied. The patient tolerated the procedure well without immediate post procedural complication. IMPRESSION: Successful placement of 19 cm tip to cuff tunneled hemodialysis catheter via the right internal jugular vein with tips terminating within the superior aspect of the right atrium. The catheter is ready for immediate use. Electronically Signed   By: Jacqulynn Cadet M.D.   On: 05/24/2022 12:39   IR US Guide Vasc Access Right  Result Date: 05/24/2022 INDICATION: 72 year old female in need of durable dialysis access. She presents for removal temporary dialysis catheter in placement of a tunneled dialysis catheter. EXAM: TUNNELED CENTRAL VENOUS HEMODIALYSIS CATHETER PLACEMENT  WITH ULTRASOUND AND FLUOROSCOPIC GUIDANCE MEDICATIONS: 2 g Ancef. The antibiotic was given in an appropriate time interval prior to skin puncture. ANESTHESIA/SEDATION: Moderate (conscious) sedation was employed during this procedure. A total of Versed 1 mg and Fentanyl 25 mcg was administered intravenously. Moderate Sedation Time: 11 minutes. The patient's level of consciousness and vital signs were monitored continuously by radiology nursing throughout the procedure under my direct supervision. FLUOROSCOPY TIME:  Radiation exposure index: 1 mGy reference air kerma COMPLICATIONS: None immediate. PROCEDURE: Informed written consent was obtained from the patient after a discussion of the risks, benefits, and alternatives to treatment. Questions regarding the procedure were encouraged and answered. The right neck and chest were prepped with chlorhexidine in a sterile fashion, and a sterile drape was applied covering the operative field. Maximum barrier sterile technique with sterile gowns and gloves were used for the procedure. A timeout was performed prior to the initiation of the procedure. After creating a small venotomy incision, a micropuncture kit was utilized to access the right internal jugular vein under direct, real-time ultrasound guidance after the overlying soft tissues were anesthetized with 1% lidocaine with epinephrine. Ultrasound image documentation was performed. The microwire was kinked to measure appropriate catheter length. A stiff Glidewire was advanced to the level of the IVC and the micropuncture sheath was exchanged for a peel-away sheath. A palindrome tunneled hemodialysis catheter measuring 19 cm from tip to cuff was tunneled in a retrograde fashion from the anterior chest wall to the venotomy incision. The catheter was then placed through the peel-away sheath with tips ultimately positioned within the superior aspect of the right atrium. Final catheter positioning was confirmed and  documented with a spot radiographic image. The catheter aspirates and flushes normally. The catheter was flushed with appropriate volume heparin dwells. The catheter exit site was secured with a 0-Prolene retention suture. The venotomy incision was closed with an interrupted 4-0 Vicryl, Dermabond and Steri-strips. Dressings were applied. The patient tolerated the procedure well without immediate post procedural complication. IMPRESSION: Successful  placement of 19 cm tip to cuff tunneled hemodialysis catheter via the right internal jugular vein with tips terminating within the superior aspect of the right atrium. The catheter is ready for immediate use. Electronically Signed   By: Jacqulynn Cadet M.D.   On: 05/24/2022 12:39   DG CHEST PORT 1 VIEW  Result Date: 05/22/2022 CLINICAL DATA:  Evaluate for intra-abdominal free air. EXAM: PORTABLE CHEST 1 VIEW COMPARISON:  Abdominal radiograph 05/22/2022 FINDINGS: Central venous catheter tip projects over the superior vena cava. Stable cardiac and mediastinal contours. Small layering right pleural effusion with underlying consolidation. No pneumothorax. No definite free air no definite gas identified underlying the hemidiaphragms. IMPRESSION: 1. Small layering right pleural effusion with underlying consolidation. 2. No definite free air identified underlying the hemidiaphragms. Consider further evaluation with decubitus imaging of the abdomen if there is persistent clinical concern given the limitations of this portable semi-erect chest radiograph. Electronically Signed   By: Lovey Newcomer M.D.   On: 05/22/2022 16:02   DG Abd 1 View  Result Date: 05/22/2022 CLINICAL DATA:  Postoperative abdominal pain. EXAM: ABDOMEN - 1 VIEW COMPARISON:  None Available. FINDINGS: Gas is demonstrated within nondilated loops of large and small bowel in a nonobstructed pattern. Supine evaluation limited for the detection of free intraperitoneal air. Lumbar spine degenerative changes.  Calcific density projecting over the pelvis, nonspecific, potentially calcified uterine fibroid. IMPRESSION: Nonobstructed bowel-gas pattern. Electronically Signed   By: Lovey Newcomer M.D.   On: 05/22/2022 14:08   DG CHEST PORT 1 VIEW  Result Date: 05/21/2022 CLINICAL DATA:  Leukocytosis EXAM: PORTABLE CHEST 1 VIEW COMPARISON:  05/18/2022 FINDINGS: Central venous line tip in distal SVC. Normal cardiac silhouette. Small RIGHT effusion. Mild venous congestion. No pneumothorax. No focal consolidation. Lymphadenectomy clips in the LEFT axilla. No significant interval change. IMPRESSION: 1. No significant interval change. 2. Small RIGHT effusion. 3. Mild venous congestion. Electronically Signed   By: Suzy Bouchard M.D.   On: 05/21/2022 08:22   US BIOPSY (KIDNEY)  Result Date: 05/19/2022 INDICATION: 72 year old female with history of end-stage renal disease. EXAM: ULTRASOUND GUIDED RENAL BIOPSY COMPARISON:  None Available. MEDICATIONS: None. ANESTHESIA/SEDATION: Fentanyl 25 mcg IV; Versed 0.5 mg IV Total Moderate Sedation time: 10 minutes; The patient was continuously monitored during the procedure by the interventional radiology nurse under my direct supervision. COMPLICATIONS: None immediate. PROCEDURE: Informed written consent was obtained from the patient after a discussion of the risks, benefits and alternatives to treatment. The patient understands and consents the procedure. A timeout was performed prior to the initiation of the procedure. Ultrasound scanning was performed of the bilateral flanks. The inferior pole of the left kidney was selected for biopsy due to location and sonographic window. The procedure was planned. The operative site was prepped and draped in the usual sterile fashion. The overlying soft tissues were anesthetized with 1% lidocaine with epinephrine. A 17 gauge coaxial introducer needle was advanced into the inferior cortex of the left kidney and 2 core biopsies were obtained under  direct ultrasound guidance with a 16 gauge core biopsy device. Images were saved for documentation purposes. The biopsy device was removed and hemostasis was obtained with manual compression after injection of Gel-Foam slurry along the needle track under ultrasound guidance. Post procedural scanning was negative for significant post procedural hemorrhage or additional complication. A dressing was placed. The patient tolerated the procedure well without immediate post procedural complication. IMPRESSION: Technically successful ultrasound guided left renal biopsy. Ruthann Cancer, MD Vascular and Interventional Radiology  Special[facility] Radiology Electronically Signed   By: Ruthann Cancer M.D.   On: 05/19/2022 14:19   DG Chest Port 1 View  Result Date: 05/18/2022 CLINICAL DATA:  Central line placement. EXAM: PORTABLE CHEST 1 VIEW COMPARISON:  CT chest and chest x-ray from 2 days ago. FINDINGS: New right internal jugular central venous catheter with tip at the cavoatrial junction. The heart size and mediastinal contours are within normal limits. Mild diffuse interstitial thickening similar. Decreasing small right and unchanged small left pleural effusions. Increasing asymmetric density in the peripheral left upper lobe. Similar atelectasis in the peripheral right middle lobe. Improving right lower lobe atelectasis. No pneumothorax. No acute osseous abnormality. IMPRESSION: 1. New right internal jugular central venous catheter with tip at the cavoatrial junction. No pneumothorax. 2. Similar diffuse interstitial thickening with increasing asymmetric density in the peripheral left upper lobe. Differential considerations include infection or pulmonary edema. 3. Decreased small right and unchanged small left pleural effusions. Electronically Signed   By: Titus Dubin M.D.   On: 05/18/2022 14:06   US RENAL  Result Date: 05/18/2022 CLINICAL DATA:  Chronic kidney disease EXAM: RENAL / URINARY TRACT ULTRASOUND  COMPLETE COMPARISON:  Abdomen CT report 01/13/2018 FINDINGS: Right Kidney: Renal measurements: 8.9 x 4.7 x 4.4 = volume: 97 mL. Kidney appears echogenic. No collecting system dilatation. There is a simple cyst along the midportion of the kidney which is anechoic with through transmission measuring 17 mm. Left Kidney: Renal measurements: 7.5 x 3.9 x 3.2 = volume: 49 mL. Small echogenic appearing kidney. No collecting system dilatation. Anechoic rounded structure seen in the mid kidney measuring 2 cm. Through transmission is smoothly marginated. Bladder: Appears normal for degree of bladder distention. Other: Scattered ascites.  Probable right-sided pleural effusion. IMPRESSION: Echogenic small appearing kidneys. No collecting system dilatation. Bilateral simple appearing renal cysts. Ascites.  Right pleural effusion Electronically Signed   By: Jill Side M.D.   On: 05/18/2022 11:07   CT Chest Wo Contrast  Result Date: 05/16/2022 CLINICAL DATA:  72 year old female with history of suspected pneumonia. EXAM: CT CHEST WITHOUT CONTRAST TECHNIQUE: Multidetector CT imaging of the chest was performed following the standard protocol without IV contrast. RADIATION DOSE REDUCTION: This exam was performed according to the departmental dose-optimization program which includes automated exposure control, adjustment of the mA and/or kV according to patient size and/or use of iterative reconstruction technique. COMPARISON:  Chest x-ray 05/16/2022. FINDINGS: Cardiovascular: Heart size is normal. There is no significant pericardial fluid, thickening or pericardial calcification. Aortic atherosclerosis. No definite coronary artery calcifications. Mild calcifications of the aortic valve. Mediastinum/Nodes: No pathologically enlarged mediastinal or hilar lymph nodes. Please note that accurate exclusion of hilar adenopathy is limited on noncontrast CT scans. Esophagus is unremarkable in appearance. No axillary lymphadenopathy.  Lungs/Pleura: Moderate to large right and small to moderate left pleural effusions lying dependently associated with areas of passive subsegmental atelectasis in the lower lobes of the lungs bilaterally. In addition, there are mid to upper lung predominant patchy areas of ground-glass attenuation and nodular appearing consolidative airspace disease, likely to reflect multilobar bilateral bronchopneumonia. The largest of these nodular appearing regions in the apex of the left upper lobe (axial image 32 of series 4) measuring 1.3 x 1.2 cm. Upper Abdomen: Aortic atherosclerosis. Tiny partially calcified gallstones lying dependently in the fundus of the gallbladder. Small volume of ascites. Multiple renal lesions bilaterally, incompletely characterized on today's noncontrast CT examination, most notable for an exophytic intermediate attenuation lesion (25 HU) measuring 1.9 cm  in the anterior aspect of the interpolar region of the left kidney. A cluster of nonobstructive calculi is also noted in the posterior aspect of the upper pole collecting system of the right kidney measuring up to 3 mm. Musculoskeletal: There are no aggressive appearing lytic or blastic lesions noted in the visualized portions of the skeleton. IMPRESSION: 1. The appearance the chest is most suggestive of multilobar bilateral bronchopneumonia with bilateral parapneumonic pleural effusions, as above. 2. Given the nodular appearance of some of the areas of apparent airspace consolidation, follow-up noncontrast chest CT is recommended in 3 months to ensure resolution of the nodular areas following treatment for the patient's acute illness, to exclude underlying neoplasm. 3. Multiple renal lesions bilaterally, incompletely characterized on today's noncontrast CT examination, including an indeterminate 1.9 cm lesion in the anterior aspect of the interpolar region of the left kidney. Follow-up nonemergent outpatient abdominal MRI with and without IV  gadolinium is recommended in the near future to definitively evaluate this lesion and exclude neoplasm. 4. Cholelithiasis. 5. Nonobstructive calculi in the right renal collecting system measuring up to 3 mm. 6. Ascites. 7. Aortic atherosclerosis. Aortic Atherosclerosis (ICD10-I70.0). Electronically Signed   By: Vinnie Langton M.D.   On: 05/16/2022 05:23   DG Chest Port 1 View  Result Date: 05/16/2022 CLINICAL DATA:  Shortness of breath EXAM: PORTABLE CHEST 1 VIEW COMPARISON:  05/07/2022 FINDINGS: Cardiac shadow is enlarged but stable. Small pleural effusion on the right is again noted. Some increased basilar density is noted on the right when compared with the prior study. Postsurgical changes in the left breast are seen. No bony abnormality is noted. IMPRESSION: Persistent right-sided effusion with slight increase in basilar infiltrate on the right. Electronically Signed   By: Inez Catalina M.D.   On: 05/16/2022 03:45    Orson Eva, DO  [facility]  If 7PM-7AM, please contact night-coverage [email or web address] Password [facility] 05/30/2022, 10:36 AM   LOS: 14 days

## 2022-05-30 NOTE — Progress Notes (Signed)
PHARMACY - TOTAL PARENTERAL NUTRITION CONSULT NOTE   Indication:  perforate ulcer, unable to meet nutritional needs orally/enterally  Patient Measurements: Height: '4\' 11"'$  (149.9 cm) Weight: 58.2 kg (128 lb 4.9 oz) IBW/kg (Calculated) : 43.2 TPN AdjBW (KG): 46.7 Body mass index is 25.91 kg/m. Usual Weight: 57kg  Assessment: 72 year old female originally admitted with pneumonia and AKI on 1/14. Admission complicated with anemia requiring colonoscopy and EGG. Pain returned 1/25, intraperitoneal gas and fluid on CT 1/25 concerning for perforation. Patient went for emergent exlap overnight and omental patch repair was done. Patient now intubated in ICU. Patient will remain npo and likely need TPN for a week per surgery.   Glucose / Insulin: CBGs 109-203, 3 units of insulin required yesterday. Noted on steroids Electrolytes: CoCa = 8.5, Po4 2.3, Mg 1.7 Renal: on IHD TTS Hepatic: TG wnl Intake / Output; MIVF: 2161/300, no MIVF GI Imaging: ABD [place] 1/23 CT 1/25 GI Surgeries / Procedures:  Colonoscopy 1/20 EGD 1/20 Emergent Ex-lap with omental patch 1/26  Central access: CVC placed 1/26 TPN start date: 1/26  Nutritional Goals: Goal TPN rate is 80 mL/hr (provides 92 g of protein and 1797 kcals per day)  RD Assessment: Estimated Needs Total Energy Estimated Needs: 1800-1900 Total Protein Estimated Needs: 90-98 gr Total Fluid Estimated Needs: 1000 ml plus urine output  Current Nutrition:  NPO  Plan:  Continue TPN to 18m/hr  Electrolytes in TPN: Na 566m/L, K 5052mL, Ca 5mE45m, Mg 5mEq96m and Phos 20 mmol/L. Cl:Ac 1:1 -Will supplement magnesium outside of TPN today Add standard MVI, folic acid, and trace elements to TPN, will remove chromium due to renal disease Increase to Sensitive q8h SSI and adjust as needed  Holding off on MIVF due to patient being ESRD and having difficulty removing fluid due to blood pressure Monitor TPN labs on Mon/Thurs  FrankErin HearingmD.,  BCPS Clinical Pharmacist 05/30/2022 9:22 AM

## 2022-05-30 NOTE — Progress Notes (Signed)
[facility]  Says she is itching on her back. Otherwise no major issues.  BP (!) 162/45   Pulse 94   Temp 98.5 F (36.9 C) (Rectal)   Resp 14   Ht '4\' 11"'$  (1.499 m)   Wt 58.2 kg   SpO2 94%   BMI 25.91 kg/m  Midline with honeycomb, JP with pinkish ss fluid   Patient s/p duodenal perforation and bleed from ulcer. Doing fair. Enterococcus in the peritoneal fluid that is resistant. Pharmacy already added zyvox for urine with infection and VRE.   PRN For pain NPO, NG TPN UGI 1/31 Zyvox and diflucan for the intraperitoneal contamination  H&H drifting  Curlene Labrum, MD [facility] [place], [place] [address] [phone] (office)

## 2022-05-30 NOTE — Progress Notes (Signed)
Pharmacy Antibiotic Note  Sheri Cooper is a 72 y.o. female admitted on 05/16/2022 with  duodenal perforation with purulent ascites .  Pharmacy has been consulted for Zosyn and Fluconazole dosing.   Patient continues to be afebrile, WBC elevated at 17.8 but trending down. New to HD, has been doing TTS on schedule. Zyvox added by primary team for VRE in urine, all other cxs no growth.   Plan: Zosyn 2.25g IV q8h Fluconazole 200 mg IV q24h Zyvox '600mg'$  IV bid  Trend WBC, temp F/U infectious work-up  Height: '4\' 11"'$  (149.9 cm) Weight: 58.2 kg (128 lb 4.9 oz) IBW/kg (Calculated) : 43.2  Temp (24hrs), Avg:98.5 F (36.9 C), Min:98.2 F (36.8 C), Max:98.7 F (37.1 C)  Recent Labs  Lab 05/25/22 0404 05/26/22 0410 05/27/22 0420 05/28/22 0714 05/28/22 0803 05/29/22 0508 05/30/22 0500 05/30/22 0751  WBC 20.4* 28.8* 24.3* 18.6*  --   --   --  17.8*  CREATININE 3.10* 2.00* 2.98* 1.76* 1.71* 2.55* 1.88*  --      Estimated Creatinine Clearance: 21 mL/min (A) (by C-G formula based on SCr of 1.88 mg/dL (H)).    No Known Allergies  Vanco 1/14 >> 1/15 Cefepime 1/14 >>1/18 Fcz 1/26> Zosyn 1/26> Zyvox 1/28>   1/14 Bcx: ng 1/14 MRSA PCR: neg 1/26 urine: enterococcu faecium - S zyvox 1/25 Peritoneal ngtd  Erin Hearing PharmD., BCPS Clinical Pharmacist 05/30/2022 11:04 AM

## 2022-05-31 DIAGNOSIS — N179 Acute kidney failure, unspecified: Secondary | ICD-10-CM | POA: Diagnosis not present

## 2022-05-31 DIAGNOSIS — J189 Pneumonia, unspecified organism: Secondary | ICD-10-CM | POA: Diagnosis not present

## 2022-05-31 DIAGNOSIS — K631 Perforation of intestine (nontraumatic): Secondary | ICD-10-CM | POA: Diagnosis not present

## 2022-05-31 DIAGNOSIS — A419 Sepsis, unspecified organism: Secondary | ICD-10-CM

## 2022-05-31 LAB — BPAM RBC
Blood Product Expiration Date: 202403022359
Blood Product Expiration Date: 202403022359
Blood Product Expiration Date: 202403022359
Blood Product Expiration Date: 202403022359
Blood Product Expiration Date: 202403022359
ISSUE DATE / TIME: 202401252002
ISSUE DATE / TIME: 202401252303
ISSUE DATE / TIME: 202401260010
ISSUE DATE / TIME: 202401271819
Unit Type and Rh: 5100
Unit Type and Rh: 5100
Unit Type and Rh: 5100
Unit Type and Rh: 5100
Unit Type and Rh: 5100

## 2022-05-31 LAB — HEMOGLOBIN AND HEMATOCRIT, BLOOD
HCT: 22.5 % — ABNORMAL LOW (ref 36.0–46.0)
HCT: 22.5 % — ABNORMAL LOW (ref 36.0–46.0)
Hemoglobin: 7.1 g/dL — ABNORMAL LOW (ref 12.0–15.0)
Hemoglobin: 7.1 g/dL — ABNORMAL LOW (ref 12.0–15.0)

## 2022-05-31 LAB — RENAL FUNCTION PANEL
Albumin: 1.9 g/dL — ABNORMAL LOW (ref 3.5–5.0)
Anion gap: 10 (ref 5–15)
BUN: 87 mg/dL — ABNORMAL HIGH (ref 8–23)
CO2: 22 mmol/L (ref 22–32)
Calcium: 7 mg/dL — ABNORMAL LOW (ref 8.9–10.3)
Chloride: 100 mmol/L (ref 98–111)
Creatinine, Ser: 2.34 mg/dL — ABNORMAL HIGH (ref 0.44–1.00)
GFR, Estimated: 22 mL/min — ABNORMAL LOW (ref >60)
Glucose, Bld: 160 mg/dL — ABNORMAL HIGH (ref 70–99)
Phosphorus: 3.7 mg/dL (ref 2.5–4.6)
Potassium: 5.4 mmol/L — ABNORMAL HIGH (ref 3.5–5.1)
Sodium: 132 mmol/L — ABNORMAL LOW (ref 135–145)

## 2022-05-31 LAB — TYPE AND SCREEN
ABO/RH(D): O POS
Antibody Screen: NEGATIVE
Unit division: 0
Unit division: 0
Unit division: 0
Unit division: 0
Unit division: 0

## 2022-05-31 LAB — GLUCOSE, CAPILLARY
Glucose-Capillary: 233 mg/dL — ABNORMAL HIGH (ref 70–99)
Glucose-Capillary: 252 mg/dL — ABNORMAL HIGH (ref 70–99)

## 2022-05-31 LAB — TRIGLYCERIDES: Triglycerides: 178 mg/dL — ABNORMAL HIGH (ref <150)

## 2022-05-31 LAB — ALKALINE PHOSPHATASE: Alkaline Phosphatase: 77 U/L (ref 38–126)

## 2022-05-31 LAB — PROTEIN, TOTAL: Total Protein: 4.3 g/dL — ABNORMAL LOW (ref 6.5–8.1)

## 2022-05-31 LAB — ALT: ALT: 13 U/L (ref 0–44)

## 2022-05-31 LAB — MAGNESIUM: Magnesium: 2.6 mg/dL — ABNORMAL HIGH (ref 1.7–2.4)

## 2022-05-31 LAB — PHOSPHORUS: Phosphorus: 3.8 mg/dL (ref 2.5–4.6)

## 2022-05-31 LAB — CALCIUM: Calcium: 7.3 mg/dL — ABNORMAL LOW (ref 8.9–10.3)

## 2022-05-31 LAB — AST: AST: 32 U/L (ref 15–41)

## 2022-05-31 MED ORDER — HEPARIN SODIUM (PORCINE) 1000 UNIT/ML IJ SOLN
INTRAMUSCULAR | Status: AC
  Filled 2022-05-31: qty 5

## 2022-05-31 MED ORDER — TRAVASOL 10 % IV SOLN
INTRAVENOUS | Status: AC
  Filled 2022-05-31: qty 960

## 2022-05-31 NOTE — TOC Progression Note (Signed)
Transition of Care [facility]) - Progression Note    Patient Details  Name: MAANSI WIKE MRN: [id number] Date of Birth: [birth year]  Transition of Care [facility]) CM/SW Contact  Shade Flood, LCSW Phone Number: 05/31/2022, 2:08 PM  Clinical Narrative:     TOC following. MD anticipating dc Saturday after HD. PT recommending SNF rehab at dc. Met with pt at bedside today to review above. Pt agreeable to SNF referrals. CMS provider options reviewed and will refer as requested. Will start insurance auth when pt closer to being medically stable for dc.  Will follow.  Expected Discharge Plan: [place] Barriers to Discharge: Continued Medical Work up  Expected Discharge Plan and Services In-house Referral: Clinical Social Work   Post Acute Care Choice: [place] Living arrangements for the past 2 months: Apartment                                       Social Determinants of Health (SDOH) Interventions SDOH Screenings   Food Insecurity: No Food Insecurity (05/19/2022)  Housing: Low Risk  (05/19/2022)  Transportation Needs: No Transportation Needs (05/19/2022)  Utilities: Not At Risk (05/19/2022)  Tobacco Use: Medium Risk (05/27/2022)    Readmission Risk Interventions    05/18/2022   11:51 AM  Readmission Risk Prevention Plan  Transportation Screening Complete  PCP or Specialist Appt within 5-7 Days Not Complete  Home Care Screening Complete  Medication Review (RN CM) Complete

## 2022-05-31 NOTE — Progress Notes (Signed)
  HEMODIALYSIS TREATMENT NOTE:  3 hour heparin-free treatment completed using RIJ TDC.  Dilaudid 0.'5mg'$  given once for pain.  Goal met: 1.5 liters removed without interruption in UF.  All blood was returned.  Hand-off given to Shirlee More, LPN.    06/58/26 0888  Vital Signs  Temp 98 F (36.7 C)  Temp Source Oral  Pulse Rate 83  Pulse Rate Source Monitor  Resp 18  BP (!) 162/53  BP Location Right Arm  BP Method Automatic  Patient Position (if appropriate) Lying  Oxygen Therapy  SpO2 98 %  O2 Device Room Air  Dialysis Weight  Weight 63.8 kg  Type of Weight Post-Dialysis  Post Treatment  Dialyzer Clearance Lightly streaked  Duration of HD Treatment -hour(s) 3 hour(s)  Hemodialysis Intake (mL) 0 mL  Liters Processed 54.3  Fluid Removed (mL) 1500 mL  Tolerated HD Treatment Yes  Post-Hemodialysis Comments Goal met    Rockwell Alexandria, RN AP [facility]

## 2022-05-31 NOTE — Plan of Care (Signed)
  Problem: Acute Rehab PT Goals(only PT should resolve) Goal: Pt Will Go Supine/Side To Sit Outcome: Progressing Flowsheets (Taken 05/31/2022 1500) Pt will go Supine/Side to Sit: with minimal assist Goal: Patient Will Transfer Sit To/From Stand Outcome: Progressing Flowsheets (Taken 05/31/2022 1500) Patient will transfer sit to/from stand:  with minimal assist  with moderate assist Goal: Pt Will Transfer Bed To Chair/Chair To Bed Outcome: Progressing Flowsheets (Taken 05/31/2022 1500) Pt will Transfer Bed to Chair/Chair to Bed:  with min assist  with mod assist Goal: Pt Will Ambulate Outcome: Progressing Flowsheets (Taken 05/31/2022 1500) Pt will Ambulate:  15 feet  with moderate assist  with rolling walker   3:01 PM, 05/31/22 Lonell Grandchild, MPT Physical Therapist with [facility] 336 [phone] office [phone] mobile phone

## 2022-05-31 NOTE — Progress Notes (Signed)
Chart reviewed.    Will need PPI BID for 3 months then decrease to daily. Consider surveillance EGD later this year. Will need empiric H.pylori treatment prescribed at discharge. Hgb 7.1 this morning.   GI signing off. Please let us know if we can be of any further assistance. Will arrange outpatient follow-up.

## 2022-05-31 NOTE — Progress Notes (Signed)
PHARMACY - TOTAL PARENTERAL NUTRITION CONSULT NOTE   Indication:  perforate ulcer, unable to meet nutritional needs orally/enterally  Patient Measurements: Height: '4\' 11"'$  (149.9 cm) Weight: 65.2 kg (143 lb 11.8 oz) IBW/kg (Calculated) : 43.2 TPN AdjBW (KG): 46.7 Body mass index is 29.03 kg/m. Usual Weight: 57kg  Assessment: 72 year old female originally admitted with pneumonia and AKI on 1/14. Admission complicated with anemia requiring colonoscopy and EGG. Pain returned 1/25, intraperitoneal gas and fluid on CT 1/25 concerning for perforation. Patient went for emergent exlap overnight and omental patch repair was done. Patient will remain npo and likely need TPN for a week per surgery. UGI study planned for 1/31.   Will adjust electrolytes, hold potassium and magnesium today. Noted plans for hemodialysis today.   Glucose / Insulin: CBGs 168-224, 7 units of insulin required yesterday. Patient continues on steroids Electrolytes: CoCa = 8.7, Po4 3.8, Mg 2.6 Renal: on IHD TTS prior to admit - off schedule currently Hepatic: TG 178 Intake / Output; MIVF: inaccurate, no MIVF GI Imaging: ABD [place] 1/23 CT 1/25 GI Surgeries / Procedures:  Colonoscopy 1/20 EGD 1/20 Emergent Ex-lap with omental patch 1/26  Central access: CVC placed 1/26 TPN start date: 1/26  Nutritional Goals: Goal TPN rate is 80 mL/hr (provides 92 g of protein and 1812 kcals per day)  RD Assessment: Estimated Needs Total Energy Estimated Needs: 1800-1900 Total Protein Estimated Needs: 90-98 gr Total Fluid Estimated Needs: 1000 ml plus urine output  Current Nutrition:  NPO  Plan:  Continue TPN to 40m/hr  Electrolytes in TPN: Na 570m/L, K 0 mEq/L, Ca 45m47mL, Mg 0 mEq/L, and Phos 15 mmol/L. Cl:Ac 1:1 Add standard MVI, folic acid, and trace elements to TPN, chromium removed due to renal disease Continue Sensitive q8h SSI and adjust as needed  Holding off on MIVF due to patient being ESRD and having difficulty  removing fluid due to blood pressure Monitor TPN labs on Mon/Thurs  FraErin HearingarmD., BCPS Clinical Pharmacist 05/31/2022 8:24 AM

## 2022-05-31 NOTE — Evaluation (Signed)
Physical Therapy Evaluation Patient Details Name: Sheri Cooper MRN: [id number] DOB: 04-May-1950 Today's Date: 05/31/2022  History of Present Illness  Sheri Cooper is a 72 y.o. female with medical history significant of COPD, rheumatoid arthritis, stage IV CKD who presents to the emergency department due to ongoing and worsening shortness of breath which has been ongoing since being discharged from [facility] on 1/10.  Patient was admitted and discharged from [facility] from 1/5 through 05/12/2022 due to community-acquired pneumonia and COPD exacerbation as well as acute kidney injury.  She received antibiotics (ceftriaxone and azithromycin) for pneumonia and she did receive nebulizer treatment and was transferred to [facility] on oxygen prior to being discharged.  Patient was supposed to follow-up with a neurologist [facility]) after the discharge, but she has not been able to do these due to ongoing shortness of breath.  Patient states that she has been at her brother's house since discharge and that the house was very cold.  She states that she has been retaining fluids since she left the hospital and that shortness of breath has worsened.  EMS was activated and patient was taken to the ED for further evaluation and management.   Clinical Impression  Patient demonstrates slow labored movement for sitting up at bedside, unable to stand without AD due to BLE weakness, required use of RW demonstrated unsteady labored movement with shuffling of feet during transfer to chair and unable to step away from bedside due to fall risk.  Patient tolerated sitting up in chair after therapy - nurse notified.  Patient will benefit from continued skilled physical therapy in hospital and recommended venue below to increase strength, balance, endurance for safe ADLs and gait.         Recommendations for follow up therapy are one component of a multi-disciplinary discharge planning process, led by the  attending physician.  Recommendations may be updated based on patient status, additional functional criteria and insurance authorization.  Follow Up Recommendations Skilled nursing-short term rehab (<3 hours/day) Can patient physically be transported by private vehicle: No    Assistance Recommended at Discharge Set up Supervision/Assistance  Patient can return home with the following  A lot of help with bathing/dressing/bathroom;A lot of help with walking and/or transfers;Assistance with cooking/housework;Help with stairs or ramp for entrance    Equipment Recommendations Rolling walker (2 wheels)  Recommendations for Other Services       Functional Status Assessment Patient has had a recent decline in their functional status and demonstrates the ability to make significant improvements in function in a reasonable and predictable amount of time.     Precautions / Restrictions Precautions Precautions: Fall Restrictions Weight Bearing Restrictions: No      Mobility  Bed Mobility Overal bed mobility: Needs Assistance Bed Mobility: Supine to Sit     Supine to sit: Mod assist     General bed mobility comments: increaed time, labored movement    Transfers Overall transfer level: Needs assistance Equipment used: Rolling walker (2 wheels) Transfers: Sit to/from Stand, Bed to chair/wheelchair/BSC Sit to Stand: Mod assist, Max assist   Step pivot transfers: Mod assist, Max assist       General transfer comment: unsteady labored movement with buckling of knees, unable to maintain standing balance without AD, required use of RW    Ambulation/Gait Ambulation/Gait assistance: Max assist Gait Distance (Feet): 3 Feet   Gait Pattern/deviations: Decreased step length - right, Decreased step length - left, Decreased stride length, Trunk flexed, Shuffle Gait  velocity: slow     General Gait Details: limited to a few shuffling side steps at bedside before having to sit due to fall  risk  Stairs            Wheelchair Mobility    Modified Rankin (Stroke Patients Only)       Balance Overall balance assessment: Needs assistance Sitting-balance support: Feet supported, No upper extremity supported Sitting balance-Leahy Scale: Fair Sitting balance - Comments: seated at EOB   Standing balance support: Reliant on assistive device for balance, During functional activity, Bilateral upper extremity supported Standing balance-Leahy Scale: Poor Standing balance comment: using RW                             Pertinent Vitals/Pain Pain Assessment Pain Assessment: No/denies pain    Home Living Family/patient expects to be discharged to:: Private residence Living Arrangements: Alone Available Help at Discharge: Family;Available 24 hours/day Type of Home: Apartment Home Access: Level entry       Home Layout: One level Home Equipment: Cane - single point      Prior Function Prior Level of Function : Independent/Modified Independent             Mobility Comments: Community ambulator without AD, drives ADLs Comments: Independent     Hand Dominance        Extremity/Trunk Assessment   Upper Extremity Assessment Upper Extremity Assessment: Generalized weakness    Lower Extremity Assessment Lower Extremity Assessment: Generalized weakness    Cervical / Trunk Assessment Cervical / Trunk Assessment: Normal  Communication   Communication: No difficulties  Cognition Arousal/Alertness: Awake/alert Behavior During Therapy: WFL for tasks assessed/performed Overall Cognitive Status: Within Functional Limits for tasks assessed                                          General Comments      Exercises     Assessment/Plan    PT Assessment Patient needs continued PT services  PT Problem List Decreased strength;Decreased activity tolerance;Decreased balance;Decreased mobility       PT Treatment Interventions DME  instruction;Gait training;Stair training;Functional mobility training;Therapeutic activities;Therapeutic exercise;Patient/family education;Balance training    PT Goals (Current goals can be found in the Care Plan section)  Acute Rehab PT Goals Patient Stated Goal: return home after rehab PT Goal Formulation: With patient/family Time For Goal Achievement: 06/14/22 Potential to Achieve Goals: Good    Frequency Min 3X/week     Co-evaluation               AM-PAC PT "6 Clicks" Mobility  Outcome Measure Help needed turning from your back to your side while in a flat bed without using bedrails?: A Lot Help needed moving from lying on your back to sitting on the side of a flat bed without using bedrails?: A Lot Help needed moving to and from a bed to a chair (including a wheelchair)?: A Lot Help needed standing up from a chair using your arms (e.g., wheelchair or bedside chair)?: A Lot Help needed to walk in hospital room?: A Lot Help needed climbing 3-5 steps with a railing? : Total 6 Click Score: 11    End of Session   Activity Tolerance: Patient tolerated treatment well;Patient limited by fatigue Patient left: in chair;with call bell/phone within reach;with family/visitor present Nurse Communication: Mobility status PT  Visit Diagnosis: Unsteadiness on feet (R26.81);Other abnormalities of gait and mobility (R26.89);Muscle weakness (generalized) (M62.81)    Time: 4742-5956 PT Time Calculation (min) (ACUTE ONLY): 23 min   Charges:   PT Evaluation $PT Eval Moderate Complexity: 1 Mod PT Treatments $Therapeutic Activity: 23-37 mins        2:59 PM, 05/31/22 Lonell Grandchild, MPT Physical Therapist with [facility] [phone] office [phone] mobile phone

## 2022-05-31 NOTE — Progress Notes (Signed)
PROGRESS NOTE  Sheri Cooper AQT:[id number] DOB: [birth year] DOA: 05/16/2022 PCP: [facility]  Brief History:   72 y.o. female with medical history significant of COPD, rheumatoid arthritis, stage V CKD. Admitted on 05/16/2022 with healthcare associated pneumonia and worsening renal function with anion gap metabolic acidosis.  -Patient was recently discharged from [facility] on 05/12/2022 after treatment for COPD.  Nephrology was consulted.  Her hospitalization was prolonged secondary to HCAP and progressive renal failure.  She finished intravenous cefepime on 05/20/2022.  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  She was started on steroids by nephrology.  Pt was started on hemodialysis on 05/18/22 after temporary HD cath placement and had renal biopsy done on 05/19/22.  On 05/24/2022, tunneled dialysis catheter was placed.  An outpatient dialysis center was ultimately found for the patient.  Her first outpatient dialysis will be scheduled on 06/01/2022. Her hospitalization was prolonged secondary to GI bleed.  She was noted to have melena and hematochezia.  She was transfused 3 units total for the hospitalization.  GI was consulted.  On 05/22/2022, the patient underwent EGD and colonoscopy.  Results are discussed below.  Her hemoglobin stabilized.   On the evening of 05/27/22, pt had another large maroon stool.  Pt remains hemodynamically stable.  Case was discussed with GI, Dr. Jenetta Downer.  Case also discussed with nephrology.  It was felt that the benefit was greater than the risk of obtaining CTA of the abdomen and pelvis.   CTA showed intraperitoneal gas and fluid concerning for visceral perforation.  Based upon the location it was felt that it was likely coming from the first-second portion of the duodenum.  There is no acute bleeding.  General surgery was consulted.  Dr. Constance Haw took the patient to the OR for exploratory laparotomy.  Omental patch repair  was done.  The patient was left intubated overnight. She was extubated on 05/28/22.  She was initially on vasopressors which have been weaned off.  She remained NPO postop, and she was started on TPN. GI was reconsulted but did not feel she had any further active bleeding.  Assessment/Plan: AKI on CKD 3b -ANCA, ANA, anti-GBM neg, C3/C4 WNL. HIV, Hep B neg. FLC within acceptable limits. SPEP pending  -Anion gap metabolic acidosis noted and sodium bicarbonate -patient sees Dr. Theador Hawthorne as outpatient---discussed with Dr. Lars Masson was concerned that patient would need hemodialysis this admission which has now been started -[facility] records from [place] nephrology reviewed -appreciate [facility] follow up -first HD 05/18/22 -Right IJ HD catheter placed by Dr. 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   -continue steroids for now>> convert IV Solu-Medrol until able to take p.o. -Last HD 05/31/22   Sepsis -secondary to UTI and peritonitis -on vasopressors for brief time>>weaned off -zosyn started for peritonitis>>d/c after cultures returnd -zyvox for VRE in urine and peritoneum   Pneumoperitoneum/Peritonitis -Noted on CTA abdomen 05/27/2022 -Location likely in the first-second portion of the duodenum -05/27/22 Exploratory laparotomy with omental patch -Remain n.p.o. and start TPN per general surgery -Convert essential medications to IV if possible -continue zosyn and fluconazole -E faecium in peritoneal culture>>zyvox added   UTI -UR >50 WBC -urine culture = VRE -zyvox started   Acute respiratory failure with  hypoxia -Patient left intubated postoperatively -Consult pulmonary for management -extubated 1/26   Guaiac Positive Stools / GI bleeding/  ABLA Bleeding duodenal ulcers s/p clip x1, epinephrine and coagulation treatments Esophagitis and Gastritis  -large bloody BM noted by RN guaiac positive on 1/18 -DC'd subcutaneous heparin on 1/18 -transfused 1 unit PRBC with HD on 1/18, Hg down again to 6.1, transfused 2 unit PRBC 1/19 with HD  -GI team consulted to eval 1/19 -transfused 2 units PRBC with HD 1/19, Hg up to 9.8 -EGD / Colonoscopy planned for 05/22/22 with Dr. Jenetta Downer - with findings of grade C esophagitis and gastritis with ulcers, multiple duodenal ulcers with  adherent blood clots, bleeding vessel clip x 1, epinephrine injection given and APC; however second vessel could not be clipped. Currently on PP  -initially finished 73 hours IV pantoprazole drip>>IV bid -05/22/21 colonoscopy-blood in entire colon  -05/27/22--had another large maroon stool -05/27/2022 CT abdomen--no active GI bleed, but pneumoperitoneum found -GI reconsulted -Transfused 3 units PRBC (6 units total for admission) 1/25-1/26 -1/27 transfuse 1 unit PRBC (7 total)   COPD  -- stable  -continue bronchodilators -Pt is on steroids per nephrology for AKI   Hypokalemia>>Hyperkalemia -repleted orally  -improved -now on HD   Thrombocytopenia  - stopped [place] heparin on 1/18 - checking HIT antibody labs - NEG - likely due to acute medical illness   Incidental finding of multiple renal lesions CT  of chest showed multiple renal lesions bilaterally, incompletely characterized on today's noncontrast CT examination, including an indeterminate 1.9 cm lesion in the anterior aspect of the interpolar region of the left kidney.  Follow-up nonemergent outpatient abdominal MRI with and without IV gadolinium is recommended in the near future to definitively evaluate this lesion and exclude neoplasm   Elevated BNP--echo from 05/10/2022 at [facility] available in [place] reveals-  The left ventricle is normal in size with normal wall thickness. The left  ventricular systolic function is normal, LVEF is visually estimated at 60-65%.  There is normal left ventricular diastolic function.  -There is mild to moderate pulmonary hypertension no significant valvular abnormalities -hemodialysis to manage volume status   Diarrhea - resolved now;  - c diff testing was negative              Family Communication:   brother updated   Consultants:  GI, renal, general surgery   Code Status:  FULL    DVT Prophylaxis:  SCDs     Procedures: As Listed in Progress Note Above   Antibiotics: Zosyn 1/26>>1/28 Fluconzole 1/26>> Zyvox 1/28>>         Subjective: Patient denies fevers, chills, headache, chest pain, dyspnea, nausea, vomiting, diarrhea, abdominal pain, dysuria, hematuria, hematochezia, and melena.   Objective: Vitals:   05/31/22 0357 05/31/22 0500 05/31/22 0821 05/31/22 1427  BP: (!) 193/53  (!) 183/51 (!) 187/51  Pulse: 98  95 91  Resp: '14  20 20  '$ Temp: 98.5 F (36.9 C)  98.5 F (36.9 C) 98 F (36.7 C)  TempSrc:   Oral Oral  SpO2: 96%  97% 96%  Weight:  65.2 kg    Height:        Intake/Output Summary (Last 24 hours) at 05/31/2022 1754 Last data filed at 05/31/2022 1200 Gross per 24 hour  Intake --  Output 625 ml  Net -625 ml   Weight change: 7.3 kg Exam:  General:  Pt is alert, follows commands appropriately, not in acute distress HEENT: No icterus, No thrush, No  neck mass, [place]/AT Cardiovascular: RRR, S1/S2, no rubs, no gallops Respiratory: bibasilar rales. No wheeze Abdomen: Soft/+BS, non tender, non distended, no guarding Extremities: No edema, No lymphangitis, No petechiae, No rashes, no synovitis   Data Reviewed: I have personally reviewed following labs and imaging studies Basic Metabolic Panel: Recent Labs  Lab 05/28/22 0714 05/28/22 0803 05/29/22 0508 05/30/22 0500 05/31/22 0515  NA 135 135 135 135 132*  K 3.4* 3.5 4.5 4.6 5.4*  CL 101 101 100 99 100  CO2 '24 23 24 25 22  '$ GLUCOSE 98 97  141* 146* 160*  BUN 22 21 42* 60* 87*  CREATININE 1.76* 1.71* 2.55* 1.88* 2.34*  CALCIUM 6.6* 6.6* 7.3* 6.7* 7.3*  7.0*  MG  --  1.5* 2.0 1.7 2.6*  PHOS 3.1 3.1 3.2 2.3* 3.8  3.7   Liver Function Tests: Recent Labs  Lab 05/28/22 0714 05/28/22 0803 05/29/22 0508 05/30/22 0500 05/31/22 0515  AST  --  24  --  17 32  ALT  --  6  --  5 13  ALKPHOS  --  70  --  61 77  BILITOT  --  0.6  --  0.3  --   PROT  --  4.7*  --  4.0* 4.3*  ALBUMIN 2.2* 2.1* 2.0* 1.7* 1.9*   Recent Labs  Lab 05/25/22 0404  LIPASE 41   No results for input(s): "AMMONIA" in the last 168 hours. Coagulation Profile: No results for input(s): "INR", "PROTIME" in the last 168 hours. CBC: Recent Labs  Lab 05/25/22 0404 05/26/22 0410 05/27/22 0420 05/27/22 1831 05/28/22 0714 05/28/22 1208 05/30/22 0751 05/30/22 1117 05/30/22 1729 05/30/22 2312 05/31/22 0514 05/31/22 1133  WBC 20.4* 28.8* 24.3*  --  18.6*  --  17.8*  --   --   --   --   --   HGB 9.1* 8.4* 8.0*   < > 10.5*   < > 7.7* 7.9* 7.8* 7.3* 7.1* 7.1*  HCT 27.9* 26.0* 24.6*   < > 31.7*   < > 24.5* 25.1* 24.5* 22.9* 22.5* 22.5*  MCV 86.9 88.1 86.6  --  81.1  --  75.9*  --   --   --   --   --   PLT 146* 165 203  --  147*  --  132*  --   --   --   --   --    < > = values in this interval not displayed.   Cardiac Enzymes: No results for input(s): "CKTOTAL", "CKMB", "CKMBINDEX", "TROPONINI" in the last 168 hours. BNP: Invalid input(s): "POCBNP" CBG: Recent Labs  Lab 05/30/22 0634 05/30/22 0826 05/30/22 1750 05/30/22 2350 05/31/22 1740  GLUCAP 109* 168* 224* 224* 252*   HbA1C: No results for input(s): "HGBA1C" in the last 72 hours. Urine analysis:    Component Value Date/Time   COLORURINE YELLOW 05/28/2022 1005   APPEARANCEUR CLOUDY (A) 05/28/2022 1005   LABSPEC 1.018 05/28/2022 1005   PHURINE 7.0 05/28/2022 1005   GLUCOSEU NEGATIVE 05/28/2022 1005   HGBUR SMALL (A) 05/28/2022 1005   BILIRUBINUR NEGATIVE 05/28/2022 1005    KETONESUR NEGATIVE 05/28/2022 1005   PROTEINUR >=300 (A) 05/28/2022 1005   NITRITE NEGATIVE 05/28/2022 1005   LEUKOCYTESUR MODERATE (A) 05/28/2022 1005   Sepsis Labs: '@LABRCNTIP'$ (procalcitonin:4,lacticidven:4) ) Recent Results (from the past 240 hour(s))  Aerobic/Anaerobic Culture w Gram Stain (surgical/deep wound)     Status: None (Preliminary result)   Collection Time: 05/27/22 11:45 PM   Specimen: PATH Cytology  Peritoneal fluid; Body Fluid  Result Value Ref Range Status   Specimen Description   Final    PERITONEAL FLUID Performed at [facility], [address] [place], [place] [address]    Special Requests   Final    NONE Performed at [facility], [address], [place], [place] [address]    Gram Stain   Final    RARE WBC PRESENT, PREDOMINANTLY PMN NO ORGANISMS SEEN Performed at [facility], [place] [address] [place], [place] [address]    Culture   Final    FEW ENTEROCOCCUS FAECIUM VANCOMYCIN RESISTANT ENTEROCOCCUS ISOLATED NO ANAEROBES ISOLATED; CULTURE IN PROGRESS FOR 5 DAYS    Report Status PENDING  Incomplete   Organism ID, Bacteria ENTEROCOCCUS FAECIUM  Final      Susceptibility   Enterococcus faecium - MIC*    AMPICILLIN >=32 RESISTANT Resistant     VANCOMYCIN >=32 RESISTANT Resistant     GENTAMICIN SYNERGY SENSITIVE Sensitive     LINEZOLID 2 SENSITIVE Sensitive     * FEW ENTEROCOCCUS FAECIUM  Urine Culture (for pregnant, neutropenic or urologic patients or patients with an indwelling urinary catheter)     Status: Abnormal   Collection Time: 05/28/22 10:05 AM   Specimen: Urine, Catheterized  Result Value Ref Range Status   Specimen Description   Final    URINE, CATHETERIZED Performed at [facility], [address] [place], [place] [address]    Special Requests   Final    NONE Performed at [facility], [address], [place], [place] [address]    Culture (A)  Final    >=100,000 COLONIES/mL ENTEROCOCCUS FAECIUM VANCOMYCIN RESISTANT ENTEROCOCCUS  ISOLATED    Report Status 05/30/2022 FINAL  Final   Organism ID, Bacteria ENTEROCOCCUS FAECIUM (A)  Final      Susceptibility   Enterococcus faecium - MIC*    AMPICILLIN >=32 RESISTANT Resistant     NITROFURANTOIN 64 INTERMEDIATE Intermediate     VANCOMYCIN >=32 RESISTANT Resistant     LINEZOLID 2 SENSITIVE Sensitive     * >=100,000 COLONIES/mL ENTEROCOCCUS FAECIUM     Scheduled Meds:  sodium chloride   Intravenous Once   Chlorhexidine Gluconate Cloth  6 each Topical Daily   Chlorhexidine Gluconate Cloth  6 each Topical Q0600   [START ON 06/01/2022] darbepoetin (ARANESP) injection - DIALYSIS  200 mcg Subcutaneous Q Tue-1800   hydrALAZINE  5 mg Intravenous Q8H   insulin aspart  0-9 Units Subcutaneous Q8H   methylPREDNISolone (SOLU-MEDROL) injection  40 mg Intravenous Daily   pantoprazole  40 mg Intravenous Q12H   Continuous Infusions:  albumin human     fluconazole (DIFLUCAN) IV 200 mg (05/31/22 0336)   linezolid (ZYVOX) IV 600 mg (05/31/22 0958)   TPN ADULT (ION) 80 mL/hr at 05/30/22 1748   TPN ADULT (ION) 80 mL/hr at 05/31/22 1743    Procedures/Studies: DG Chest Port 1 View  Result Date: 05/28/2022 CLINICAL DATA:  Respiratory failure EXAM: PORTABLE CHEST 1 VIEW COMPARISON:  05/25/2022 FINDINGS: Endotracheal tube is seen 3.5 cm above the carina. Nasogastric tube extends into the upper abdomen beyond the margin of the examination. Left internal jugular central venous catheter tip within the superior vena cava. Right internal jugular hemodialysis catheter tip noted within the right atrium. The lungs are symmetrically well expanded. Partial left lower lobe collapse noted, however, with retrocardiac consolidation. Mild relative opacification the right hemithorax suggest the presence of a posteriorly layering pleural effusion on this supine examination. Superimposed trace perihilar interstitial  pulmonary infiltrate is in keeping with trace probable interstitial pulmonary edema. No  pneumothorax. Cardiac size is within normal limits. IMPRESSION: 1. Support lines and tubes in appropriate position. 2. Partial left lower lobe collapse. 3. Suspected posteriorly layering right pleural effusion. 4. Trace probable interstitial pulmonary edema. Electronically Signed   By: Fidela Salisbury M.D.   On: 05/28/2022 03:32   CT ANGIO GI BLEED  Result Date: 05/27/2022 CLINICAL DATA:  Recurrent gastrointestinal hemorrhage EXAM: CTA ABDOMEN AND PELVIS WITHOUT AND WITH CONTRAST TECHNIQUE: Multidetector CT imaging of the abdomen and pelvis was performed using the standard protocol during bolus administration of intravenous contrast. Multiplanar reconstructed images and MIPs were obtained and reviewed to evaluate the vascular anatomy. RADIATION DOSE REDUCTION: This exam was performed according to the departmental dose-optimization program which includes automated exposure control, adjustment of the mA and/or kV according to patient size and/or use of iterative reconstruction technique. CONTRAST:  52m OMNIPAQUE IOHEXOL 350 MG/ML SOLN COMPARISON:  None Available. FINDINGS: VASCULAR Aorta: Moderate mixed atherosclerotic plaque. No aortic aneurysm or dissection. No periaortic inflammatory change identified. Celiac: Patent without evidence of aneurysm, dissection, vasculitis or significant stenosis. SMA: Patent without evidence of aneurysm, dissection, vasculitis or significant stenosis. Renals: High-grade (near occlusion) stenosis of the a left renal artery origin. Less than 50% stenosis of the right renal artery origin. Normal vascular morphology. Relatively diminutive caliber of the left renal arterial vasculature distal the stenosis may relate to under filling. No aneurysm or dissection. IMA: Occluded at its origin with reconstitution via the marginal artery. 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 Focal irregular minimum 50% stenosis of the left common iliac artery  secondary to a short segment dissection flap within the proximal left common iliac artery. Right internal iliac artery is heavily diseased and occluded at its origin with subsequent reconstitution. High-grade focal stenosis of the left internal iliac artery at its origin. Proximal Outflow: Bilateral common femoral and visualized portions of the superficial and profunda femoral arteries are patent without evidence of aneurysm, dissection, vasculitis or significant stenosis. Veins: Mesenteric, splenic, portal venous, and hepatic venous structures are patent. Iliofemoral veins are patent. Renal veins and inferior vena cava are patent. Review of the MIP images confirms the above findings. NON-VASCULAR Lower chest: Small left and moderate right pleural effusions are present with bibasilar compressive atelectasis. Hepatobiliary: Cholelithiasis without pericholecystic inflammatory change. Liver unremarkable. No intra or extrahepatic biliary ductal dilation. Pancreas: Unremarkable Spleen: Unremarkable Adrenals/Urinary Tract: The adrenal glands are unremarkable. Moderate left renal cortical atrophy. Right kidney is normal in size. 6 mm plaque-like nonobstructing calculus noted within the upper pole the right kidney. Multiple simple cortical cysts are seen within the right kidney. Hypodense 16 mm lesion is seen within the interpolar region of the right kidney demonstrates no significant enhancement is compatible with a Bosniak class 2 cyst. No follow-up imaging is recommended for these lesions. No hydronephrosis. The bladder is unremarkable. Stomach/Bowel: There is mild free intraperitoneal gas present. The exact site of visceral perforation is not clearly identified on this examination, however, there is extraluminal gas and debris surrounding the first and proximal second portion of the duodenum raising the question of a perforation in this location. Endoscopic clip noted within the juncture of the first and second portion  of the duodenum. Moderate free intraperitoneal fluid. Endoscopic clip laying dependently within the cecum. The stomach, small bowel, and large bowel are otherwise unremarkable. No active gastrointestinal hemorrhage identified. Fluid within the descending colon and rectal  vault may present clinically as diarrhea. Lymphatic: No pathologic adenopathy within the abdomen and pelvis. Reproductive: Involuted fibroid within the uterus. The pelvic organs are otherwise unremarkable. Other: Moderate subcutaneous body wall edema noted dependently. Musculoskeletal: No acute bone abnormality. No lytic or blastic bone lesion. IMPRESSION: VASCULAR . 1. 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 2. 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 Focal irregular minimum 50% stenosis of the left common iliac artery secondary to a short segment dissection flap. 3. 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 High-grade focal stenosis of the left internal iliac artery at its origin. 4. 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 NON-VASCULAR . 1. Moderate free intraperitoneal gas and fluid in keeping with visceral perforation. The exact site is not clearly identified on this examination, however, more focal debris and gas adjacent to the juncture of the first and second portion of the duodenum raises the question of a perforation in this location. 2. No active gastrointestinal hemorrhage identified 3. Cholelithiasis. 4. Mild right nonobstructing nephrolithiasis. 5. Fluid within the descending colon and rectal vault may present clinically as diarrhea. Aortic Atherosclerosis (ICD10-I70.0). Electronically Signed   By: Fidela Salisbury M.D.   On: 05/27/2022 21:51   DG CHEST PORT 1 VIEW  Result Date: 05/25/2022 CLINICAL DATA:  Chest  pain EXAM: PORTABLE CHEST 1 VIEW COMPARISON:  05/22/2022 FINDINGS: Right internal jugular approach hemodialysis catheter terminates at the level of the right atrium. Stable heart size. Small right pleural effusion with associated right basilar opacity, similar to prior. Similar prominent interstitial markings bilaterally. No pneumothorax. IMPRESSION: 1. Small right pleural effusion with associated right basilar opacity, similar to prior. 2. Similar prominent interstitial markings bilaterally, likely mild edema. Electronically Signed   By: Davina Poke D.O.   On: 05/25/2022 11:39   DG Abd 1 View  Result Date: 05/25/2022 CLINICAL DATA:  Abdominal pain EXAM: ABDOMEN - 1 VIEW COMPARISON:  05/22/2022 FINDINGS: The bowel gas pattern is normal. No radio-opaque calculi or other significant radiographic abnormality are seen. Surgical clips overlie the pelvis and right upper quadrant. Calcification consistent with fibroid noted. IMPRESSION: Unremarkable bowel gas pattern. Calcified fibroid incidentally noted Electronically Signed   By: Sammie Bench M.D.   On: 05/25/2022 08:22   IR Fluoro Guide CV Line Right  Result Date: 05/24/2022 INDICATION: 72 year old female in need of durable dialysis access. She presents for removal temporary dialysis catheter in placement of a tunneled dialysis catheter. EXAM: TUNNELED CENTRAL VENOUS HEMODIALYSIS CATHETER PLACEMENT WITH ULTRASOUND AND FLUOROSCOPIC GUIDANCE MEDICATIONS: 2 g Ancef. The antibiotic was given in an appropriate time interval prior to skin puncture. ANESTHESIA/SEDATION: Moderate (conscious) sedation was employed during this procedure. A total of Versed 1 mg and Fentanyl 25 mcg was administered intravenously. Moderate Sedation Time: 11 minutes. The patient's level of consciousness and vital signs were monitored continuously by radiology nursing throughout the procedure under my direct supervision. FLUOROSCOPY TIME:  Radiation exposure index: 1 mGy reference air  kerma COMPLICATIONS: None immediate. PROCEDURE: Informed written consent was obtained from the patient after a discussion of the risks, benefits, and alternatives to treatment. Questions regarding the procedure were encouraged and answered. The right neck and chest were prepped with chlorhexidine in a sterile fashion, and a sterile drape was applied covering the operative field. Maximum barrier sterile technique with sterile gowns and gloves were  used for the procedure. A timeout was performed prior to the initiation of the procedure. After creating a small venotomy incision, a micropuncture kit was utilized to access the right internal jugular vein under direct, real-time ultrasound guidance after the overlying soft tissues were anesthetized with 1% lidocaine with epinephrine. Ultrasound image documentation was performed. The microwire was kinked to measure appropriate catheter length. A stiff Glidewire was advanced to the level of the IVC and the micropuncture sheath was exchanged for a peel-away sheath. A palindrome tunneled hemodialysis catheter measuring 19 cm from tip to cuff was tunneled in a retrograde fashion from the anterior chest wall to the venotomy incision. The catheter was then placed through the peel-away sheath with tips ultimately positioned within the superior aspect of the right atrium. Final catheter positioning was confirmed and documented with a spot radiographic image. The catheter aspirates and flushes normally. The catheter was flushed with appropriate volume heparin dwells. The catheter exit site was secured with a 0-Prolene retention suture. The venotomy incision was closed with an interrupted 4-0 Vicryl, Dermabond and Steri-strips. Dressings were applied. The patient tolerated the procedure well without immediate post procedural complication. IMPRESSION: Successful placement of 19 cm tip to cuff tunneled hemodialysis catheter via the right internal jugular vein with tips terminating  within the superior aspect of the right atrium. The catheter is ready for immediate use. Electronically Signed   By: Jacqulynn Cadet M.D.   On: 05/24/2022 12:39   IR US Guide Vasc Access Right  Result Date: 05/24/2022 INDICATION: 72 year old female in need of durable dialysis access. She presents for removal temporary dialysis catheter in placement of a tunneled dialysis catheter. EXAM: TUNNELED CENTRAL VENOUS HEMODIALYSIS CATHETER PLACEMENT WITH ULTRASOUND AND FLUOROSCOPIC GUIDANCE MEDICATIONS: 2 g Ancef. The antibiotic was given in an appropriate time interval prior to skin puncture. ANESTHESIA/SEDATION: Moderate (conscious) sedation was employed during this procedure. A total of Versed 1 mg and Fentanyl 25 mcg was administered intravenously. Moderate Sedation Time: 11 minutes. The patient's level of consciousness and vital signs were monitored continuously by radiology nursing throughout the procedure under my direct supervision. FLUOROSCOPY TIME:  Radiation exposure index: 1 mGy reference air kerma COMPLICATIONS: None immediate. PROCEDURE: Informed written consent was obtained from the patient after a discussion of the risks, benefits, and alternatives to treatment. Questions regarding the procedure were encouraged and answered. The right neck and chest were prepped with chlorhexidine in a sterile fashion, and a sterile drape was applied covering the operative field. Maximum barrier sterile technique with sterile gowns and gloves were used for the procedure. A timeout was performed prior to the initiation of the procedure. After creating a small venotomy incision, a micropuncture kit was utilized to access the right internal jugular vein under direct, real-time ultrasound guidance after the overlying soft tissues were anesthetized with 1% lidocaine with epinephrine. Ultrasound image documentation was performed. The microwire was kinked to measure appropriate catheter length. A stiff Glidewire was advanced  to the level of the IVC and the micropuncture sheath was exchanged for a peel-away sheath. A palindrome tunneled hemodialysis catheter measuring 19 cm from tip to cuff was tunneled in a retrograde fashion from the anterior chest wall to the venotomy incision. The catheter was then placed through the peel-away sheath with tips ultimately positioned within the superior aspect of the right atrium. Final catheter positioning was confirmed and documented with a spot radiographic image. The catheter aspirates and flushes normally. The catheter was flushed with appropriate volume heparin dwells. The catheter exit  site was secured with a 0-Prolene retention suture. The venotomy incision was closed with an interrupted 4-0 Vicryl, Dermabond and Steri-strips. Dressings were applied. The patient tolerated the procedure well without immediate post procedural complication. IMPRESSION: Successful placement of 19 cm tip to cuff tunneled hemodialysis catheter via the right internal jugular vein with tips terminating within the superior aspect of the right atrium. The catheter is ready for immediate use. Electronically Signed   By: Jacqulynn Cadet M.D.   On: 05/24/2022 12:39   DG CHEST PORT 1 VIEW  Result Date: 05/22/2022 CLINICAL DATA:  Evaluate for intra-abdominal free air. EXAM: PORTABLE CHEST 1 VIEW COMPARISON:  Abdominal radiograph 05/22/2022 FINDINGS: Central venous catheter tip projects over the superior vena cava. Stable cardiac and mediastinal contours. Small layering right pleural effusion with underlying consolidation. No pneumothorax. No definite free air no definite gas identified underlying the hemidiaphragms. IMPRESSION: 1. Small layering right pleural effusion with underlying consolidation. 2. No definite free air identified underlying the hemidiaphragms. Consider further evaluation with decubitus imaging of the abdomen if there is persistent clinical concern given the limitations of this portable semi-erect  chest radiograph. Electronically Signed   By: Lovey Newcomer M.D.   On: 05/22/2022 16:02   DG Abd 1 View  Result Date: 05/22/2022 CLINICAL DATA:  Postoperative abdominal pain. EXAM: ABDOMEN - 1 VIEW COMPARISON:  None Available. FINDINGS: Gas is demonstrated within nondilated loops of large and small bowel in a nonobstructed pattern. Supine evaluation limited for the detection of free intraperitoneal air. Lumbar spine degenerative changes. Calcific density projecting over the pelvis, nonspecific, potentially calcified uterine fibroid. IMPRESSION: Nonobstructed bowel-gas pattern. Electronically Signed   By: Lovey Newcomer M.D.   On: 05/22/2022 14:08   DG CHEST PORT 1 VIEW  Result Date: 05/21/2022 CLINICAL DATA:  Leukocytosis EXAM: PORTABLE CHEST 1 VIEW COMPARISON:  05/18/2022 FINDINGS: Central venous line tip in distal SVC. Normal cardiac silhouette. Small RIGHT effusion. Mild venous congestion. No pneumothorax. No focal consolidation. Lymphadenectomy clips in the LEFT axilla. No significant interval change. IMPRESSION: 1. No significant interval change. 2. Small RIGHT effusion. 3. Mild venous congestion. Electronically Signed   By: Suzy Bouchard M.D.   On: 05/21/2022 08:22   US BIOPSY (KIDNEY)  Result Date: 05/19/2022 INDICATION: 72 year old female with history of end-stage renal disease. EXAM: ULTRASOUND GUIDED RENAL BIOPSY COMPARISON:  None Available. MEDICATIONS: None. ANESTHESIA/SEDATION: Fentanyl 25 mcg IV; Versed 0.5 mg IV Total Moderate Sedation time: 10 minutes; The patient was continuously monitored during the procedure by the interventional radiology nurse under my direct supervision. COMPLICATIONS: None immediate. PROCEDURE: Informed written consent was obtained from the patient after a discussion of the risks, benefits and alternatives to treatment. The patient understands and consents the procedure. A timeout was performed prior to the initiation of the procedure. Ultrasound scanning was  performed of the bilateral flanks. The inferior pole of the left kidney was selected for biopsy due to location and sonographic window. The procedure was planned. The operative site was prepped and draped in the usual sterile fashion. The overlying soft tissues were anesthetized with 1% lidocaine with epinephrine. A 17 gauge coaxial introducer needle was advanced into the inferior cortex of the left kidney and 2 core biopsies were obtained under direct ultrasound guidance with a 16 gauge core biopsy device. Images were saved for documentation purposes. The biopsy device was removed and hemostasis was obtained with manual compression after injection of Gel-Foam slurry along the needle track under ultrasound guidance. Post procedural scanning was negative for  significant post procedural hemorrhage or additional complication. A dressing was placed. The patient tolerated the procedure well without immediate post procedural complication. IMPRESSION: Technically successful ultrasound guided left renal biopsy. Ruthann Cancer, MD [facility] Radiology Electronically Signed   By: Ruthann Cancer M.D.   On: 05/19/2022 14:19   DG Chest Port 1 View  Result Date: 05/18/2022 CLINICAL DATA:  Central line placement. EXAM: PORTABLE CHEST 1 VIEW COMPARISON:  CT chest and chest x-ray from 2 days ago. FINDINGS: New right internal jugular central venous catheter with tip at the cavoatrial junction. The heart size and mediastinal contours are within normal limits. Mild diffuse interstitial thickening similar. Decreasing small right and unchanged small left pleural effusions. Increasing asymmetric density in the peripheral left upper lobe. Similar atelectasis in the peripheral right middle lobe. Improving right lower lobe atelectasis. No pneumothorax. No acute osseous abnormality. IMPRESSION: 1. New right internal jugular central venous catheter with tip at the cavoatrial junction. No  pneumothorax. 2. Similar diffuse interstitial thickening with increasing asymmetric density in the peripheral left upper lobe. Differential considerations include infection or pulmonary edema. 3. Decreased small right and unchanged small left pleural effusions. Electronically Signed   By: Titus Dubin M.D.   On: 05/18/2022 14:06   US RENAL  Result Date: 05/18/2022 CLINICAL DATA:  Chronic kidney disease EXAM: RENAL / URINARY TRACT ULTRASOUND COMPLETE COMPARISON:  Abdomen CT report 01/13/2018 FINDINGS: Right Kidney: Renal measurements: 8.9 x 4.7 x 4.4 = volume: 97 mL. Kidney appears echogenic. No collecting system dilatation. There is a simple cyst along the midportion of the kidney which is anechoic with through transmission measuring 17 mm. Left Kidney: Renal measurements: 7.5 x 3.9 x 3.2 = volume: 49 mL. Small echogenic appearing kidney. No collecting system dilatation. Anechoic rounded structure seen in the mid kidney measuring 2 cm. Through transmission is smoothly marginated. Bladder: Appears normal for degree of bladder distention. Other: Scattered ascites.  Probable right-sided pleural effusion. IMPRESSION: Echogenic small appearing kidneys. No collecting system dilatation. Bilateral simple appearing renal cysts. Ascites.  Right pleural effusion Electronically Signed   By: Jill Side M.D.   On: 05/18/2022 11:07   CT Chest Wo Contrast  Result Date: 05/16/2022 CLINICAL DATA:  72 year old female with history of suspected pneumonia. EXAM: CT CHEST WITHOUT CONTRAST TECHNIQUE: Multidetector CT imaging of the chest was performed following the standard protocol without IV contrast. RADIATION DOSE REDUCTION: This exam was performed according to the departmental dose-optimization program which includes automated exposure control, adjustment of the mA and/or kV according to patient size and/or use of iterative reconstruction technique. COMPARISON:  Chest x-ray 05/16/2022. FINDINGS: Cardiovascular: Heart size  is normal. There is no significant pericardial fluid, thickening or pericardial calcification. Aortic atherosclerosis. No definite coronary artery calcifications. Mild calcifications of the aortic valve. Mediastinum/Nodes: No pathologically enlarged mediastinal or hilar lymph nodes. Please note that accurate exclusion of hilar adenopathy is limited on noncontrast CT scans. Esophagus is unremarkable in appearance. No axillary lymphadenopathy. Lungs/Pleura: Moderate to large right and small to moderate left pleural effusions lying dependently associated with areas of passive subsegmental atelectasis in the lower lobes of the lungs bilaterally. In addition, there are mid to upper lung predominant patchy areas of ground-glass attenuation and nodular appearing consolidative airspace disease, likely to reflect multilobar bilateral bronchopneumonia. The largest of these nodular appearing regions in the apex of the left upper lobe (axial image 32 of series 4) measuring 1.3 x 1.2 cm. Upper Abdomen: Aortic atherosclerosis. Tiny partially calcified  gallstones lying dependently in the fundus of the gallbladder. Small volume of ascites. Multiple renal lesions bilaterally, incompletely characterized on today's noncontrast CT examination, most notable for an exophytic intermediate attenuation lesion (25 HU) measuring 1.9 cm in the anterior aspect of the interpolar region of the left kidney. A cluster of nonobstructive calculi is also noted in the posterior aspect of the upper pole collecting system of the right kidney measuring up to 3 mm. Musculoskeletal: There are no aggressive appearing lytic or blastic lesions noted in the visualized portions of the skeleton. IMPRESSION: 1. The appearance the chest is most suggestive of multilobar bilateral bronchopneumonia with bilateral parapneumonic pleural effusions, as above. 2. Given the nodular appearance of some of the areas of apparent airspace consolidation, follow-up noncontrast  chest CT is recommended in 3 months to ensure resolution of the nodular areas following treatment for the patient's acute illness, to exclude underlying neoplasm. 3. Multiple renal lesions bilaterally, incompletely characterized on today's noncontrast CT examination, including an indeterminate 1.9 cm lesion in the anterior aspect of the interpolar region of the left kidney. Follow-up nonemergent outpatient abdominal MRI with and without IV gadolinium is recommended in the near future to definitively evaluate this lesion and exclude neoplasm. 4. Cholelithiasis. 5. Nonobstructive calculi in the right renal collecting system measuring up to 3 mm. 6. Ascites. 7. Aortic atherosclerosis. Aortic Atherosclerosis (ICD10-I70.0). Electronically Signed   By: Vinnie Langton M.D.   On: 05/16/2022 05:23   DG Chest Port 1 View  Result Date: 05/16/2022 CLINICAL DATA:  Shortness of breath EXAM: PORTABLE CHEST 1 VIEW COMPARISON:  05/07/2022 FINDINGS: Cardiac shadow is enlarged but stable. Small pleural effusion on the right is again noted. Some increased basilar density is noted on the right when compared with the prior study. Postsurgical changes in the left breast are seen. No bony abnormality is noted. IMPRESSION: Persistent right-sided effusion with slight increase in basilar infiltrate on the right. Electronically Signed   By: Inez Catalina M.D.   On: 05/16/2022 03:45    Orson Eva, DO  [facility]  If 7PM-7AM, please contact night-coverage [email or web address] Password [facility] 05/31/2022, 5:54 PM   LOS: 15 days

## 2022-05-31 NOTE — Progress Notes (Signed)
Patient ID: Sheri Cooper, female   DOB: October 19, 1950, 72 y.o.   MRN: [id number] [facility] Progress Note   Assessment/ Plan:   1. 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  She has been on hemodialysis since 05/18/2022 for severe azotemia with development of uremic symptoms.  Started on treatment with corticosteroids; currently on Solu-Medrol 40 mg daily with plans to transition this to oral prednisone 60 mg daily thereafter (taper as outpatient under the supervision of Dr. Theador Hawthorne while being monitored on hemodialysis).  Urine output poorly charted from overnight with labs this morning showing mild hyperkalemia and azotemia with mild hyponatremia.  Will order for hemodialysis today.  She has been accepted to an outpatient hemodialysis unit-[facility] and can start there as early as 06/01/2022.  She will need a decrease of the potassium content in her TPN by pharmacy (preferably down to 30 mEq/liter). 2.  Healthcare associated pneumonia: Appears to be showing signs of clinical improvement, without supplemental oxygen and currently on Zyvox. 3.  Anemia: Likely secondary to chronic illness and recent GI bleed for which she had emergency exploratory laparotomy with omental patch for perforated DU.  Recommendations per gastroenterology for empiric H. pylori treatment noted. 4.  Sepsis: Secondary to urinary tract infection and peritonitis.  Transiently in ICU for pressors/intubated and now weaned off of pressors and remains on intravenous linezolid.  Hemodynamic stability noted.  Subjective:   Reports to be feeling fair with some abdominal discomfort.  Informs me that she does not like dialysis.   Objective:   BP (!) 193/53 (BP Location: Right Arm)   Pulse 98   Temp 98.5 F (36.9 C)   Resp 14   Ht '4\' 11"'$  (1.499 m)   Wt 65.2 kg   SpO2 96%   BMI 29.03  kg/m   Intake/Output Summary (Last 24 hours) at 05/31/2022 0817 Last data filed at 05/31/2022 0600 Gross per 24 hour  Intake 435.23 ml  Output 835 ml  Net -399.77 ml   Weight change: 7.3 kg  Physical Exam: Gen: Appears comfortable resting in bed, NG tube in place CVS: Pulse regular rhythm, normal rate, S1 and S2 normal Resp: Poor inspiratory effort with decreased breath sounds at the bases, no rales/rhonchi Abd: Soft, JP drain in situ, tenderness to exam Ext: Trace ankle edema  Imaging: No results found.  Labs: BMET Recent Labs  Lab 05/26/22 0410 05/27/22 0420 05/28/22 0714 05/28/22 0803 05/29/22 0508 05/30/22 0500 05/31/22 0515  NA 135 136 135 135 135 135 132*  K 3.6 4.5 3.4* 3.5 4.5 4.6 5.4*  CL 99 99 101 101 100 99 100  CO2 '24 26 24 23 24 25 22  '$ GLUCOSE 64* 94 98 97 141* 146* 160*  BUN 19 33* 22 21 42* 60* 87*  CREATININE 2.00* 2.98* 1.76* 1.71* 2.55* 1.88* 2.34*  CALCIUM 7.1* 7.4* 6.6* 6.6* 7.3* 6.7* 7.0*  PHOS 2.5 2.6 3.1 3.1 3.2 2.3* 3.7   CBC Recent Labs  Lab 05/26/22 0410 05/27/22 0420 05/27/22 1831 05/28/22 0714 05/28/22 1208 05/30/22 0751 05/30/22 1117 05/30/22 1729 05/30/22 2312 05/31/22 0514  WBC 28.8* 24.3*  --  18.6*  --  17.8*  --   --   --   --   HGB 8.4* 8.0*   < > 10.5*   < > 7.7* 7.9* 7.8* 7.3* 7.1*  HCT 26.0* 24.6*   < >  31.7*   < > 24.5* 25.1* 24.5* 22.9* 22.5*  MCV 88.1 86.6  --  81.1  --  75.9*  --   --   --   --   PLT 165 203  --  147*  --  132*  --   --   --   --    < > = values in this interval not displayed.    Medications:     sodium chloride   Intravenous Once   Chlorhexidine Gluconate Cloth  6 each Topical Daily   Chlorhexidine Gluconate Cloth  6 each Topical Q0600   [START ON 06/01/2022] darbepoetin (ARANESP) injection - DIALYSIS  200 mcg Subcutaneous Q Tue-1800   hydrALAZINE  5 mg Intravenous Q8H   insulin aspart  0-9 Units Subcutaneous Q8H   methylPREDNISolone (SOLU-MEDROL) injection  40 mg Intravenous Daily    pantoprazole  40 mg Intravenous Q12H    Elmarie Shiley, MD 05/31/2022, 8:17 AM

## 2022-05-31 NOTE — Progress Notes (Signed)
[facility]  No major issues. Feeling ok.   BP (!) 183/51 (BP Location: Right Arm)   Pulse 95   Temp 98.5 F (36.9 C) (Oral)   Resp 20   Ht '4\' 11"'$  (1.499 m)   Wt 65.2 kg   SpO2 97%   BMI 29.03 kg/m  JP with serous output. Midline c/d/I with honeycomb  Patient s/p perforated duodenal ulcer and graham patch with drain. Doing well. PRN For pain NPO, NG TPN UGI 1/31 Zyvox and diflucan for the intraperitoneal contamination  H&H stabilizing  Curlene Labrum, MD [facility] [place], [place] [address] [phone] (office)

## 2022-06-01 ENCOUNTER — Encounter (HOSPITAL_COMMUNITY): Payer: Self-pay | Admitting: General Surgery

## 2022-06-01 DIAGNOSIS — K659 Peritonitis, unspecified: Secondary | ICD-10-CM | POA: Diagnosis not present

## 2022-06-01 DIAGNOSIS — K269 Duodenal ulcer, unspecified as acute or chronic, without hemorrhage or perforation: Secondary | ICD-10-CM | POA: Diagnosis not present

## 2022-06-01 DIAGNOSIS — K668 Other specified disorders of peritoneum: Secondary | ICD-10-CM | POA: Diagnosis not present

## 2022-06-01 DIAGNOSIS — J189 Pneumonia, unspecified organism: Secondary | ICD-10-CM | POA: Diagnosis not present

## 2022-06-01 LAB — RENAL FUNCTION PANEL
Albumin: 1.8 g/dL — ABNORMAL LOW (ref 3.5–5.0)
Anion gap: 8 (ref 5–15)
BUN: 53 mg/dL — ABNORMAL HIGH (ref 8–23)
CO2: 26 mmol/L (ref 22–32)
Calcium: 6.8 mg/dL — ABNORMAL LOW (ref 8.9–10.3)
Chloride: 98 mmol/L (ref 98–111)
Creatinine, Ser: 1.7 mg/dL — ABNORMAL HIGH (ref 0.44–1.00)
GFR, Estimated: 32 mL/min — ABNORMAL LOW (ref >60)
Glucose, Bld: 194 mg/dL — ABNORMAL HIGH (ref 70–99)
Phosphorus: 3.9 mg/dL (ref 2.5–4.6)
Potassium: 3.7 mmol/L (ref 3.5–5.1)
Sodium: 132 mmol/L — ABNORMAL LOW (ref 135–145)

## 2022-06-01 LAB — CBC
HCT: 20.6 % — ABNORMAL LOW (ref 36.0–46.0)
Hemoglobin: 6.4 g/dL — CL (ref 12.0–15.0)
MCH: 23.8 pg — ABNORMAL LOW (ref 26.0–34.0)
MCHC: 31.1 g/dL (ref 30.0–36.0)
MCV: 76.6 fL — ABNORMAL LOW (ref 80.0–100.0)
Platelets: 126 10*3/uL — ABNORMAL LOW (ref 150–400)
RBC: 2.69 MIL/uL — ABNORMAL LOW (ref 3.87–5.11)
RDW: 22.5 % — ABNORMAL HIGH (ref 11.5–15.5)
WBC: 14.1 10*3/uL — ABNORMAL HIGH (ref 4.0–10.5)
nRBC: 0.3 % — ABNORMAL HIGH (ref 0.0–0.2)

## 2022-06-01 LAB — GLUCOSE, CAPILLARY
Glucose-Capillary: 171 mg/dL — ABNORMAL HIGH (ref 70–99)
Glucose-Capillary: 186 mg/dL — ABNORMAL HIGH (ref 70–99)
Glucose-Capillary: 224 mg/dL — ABNORMAL HIGH (ref 70–99)
Glucose-Capillary: 229 mg/dL — ABNORMAL HIGH (ref 70–99)

## 2022-06-01 LAB — PREPARE RBC (CROSSMATCH)

## 2022-06-01 LAB — HEMOGLOBIN AND HEMATOCRIT, BLOOD
HCT: 17.4 % — ABNORMAL LOW (ref 36.0–46.0)
Hemoglobin: 5.5 g/dL — CL (ref 12.0–15.0)

## 2022-06-01 LAB — MAGNESIUM: Magnesium: 1.8 mg/dL (ref 1.7–2.4)

## 2022-06-01 MED ORDER — TRAVASOL 10 % IV SOLN
INTRAVENOUS | Status: AC
  Filled 2022-06-01: qty 960

## 2022-06-01 MED ORDER — SODIUM CHLORIDE 0.9% IV SOLUTION: Freq: Once | INTRAVENOUS | Status: AC

## 2022-06-01 NOTE — NC FL2 (Signed)
[place] LEVEL OF CARE FORM     IDENTIFICATION  Patient Name: Sheri Cooper Birthdate: [birth year] Sex: female Admission Date (Current Location): 05/16/2022  [facility] and [place] Number:  [employer] and Address:  [place] [address] [place]      Provider Number: [id number]  Attending Physician Name and Address:  Orson Eva, MD  Relative Name and Phone Number:       Current Level of Care: Hospital Recommended Level of Care: [place] Prior Approval Number:    Date Approved/Denied:   PASRR Number: [id number]  Discharge Plan: SNF    Current Diagnoses: Patient Active Problem List   Diagnosis Date Noted   Sepsis due to undetermined organism ([place]) 05/30/2022   Pneumoperitoneum 05/28/2022   Duodenal perforation ([place]) 05/28/2022   Peritonitis ([place]) 05/28/2022   Pneumonia of right lower lobe due to infectious organism 05/27/2022   Acute upper GI bleed 05/24/2022   Multiple duodenal ulcers 05/24/2022   ABLA (acute blood loss anemia) 05/24/2022   Melena 05/22/2022   HCAP (healthcare-associated pneumonia) 05/16/2022   Hypothermia 05/16/2022   Acute renal failure superimposed on stage 3b chronic kidney disease ([place]) 05/16/2022   Elevated brain natriuretic peptide (BNP) level 05/16/2022   Elevated troponin 05/16/2022   Increased anion gap metabolic acidosis 94/49/6759   Prolonged QT interval 05/16/2022   Hypoalbuminemia due to protein-calorie malnutrition (HCC) 05/16/2022   Iron deficiency anemia 05/16/2022   COPD (chronic obstructive pulmonary disease) ([place]) 05/16/2022   CKD (chronic kidney disease), stage V ([place]) 05/16/2022    Orientation RESPIRATION BLADDER Height & Weight     Self, Time, Situation, Place  Normal Continent Weight: 140 lb 10.5 oz (63.8 kg) Height:  '4\' 11"'$  (149.9 cm)  BEHAVIORAL SYMPTOMS/MOOD NEUROLOGICAL BOWEL NUTRITION STATUS      Continent Diet (see dc summary)   AMBULATORY STATUS COMMUNICATION OF NEEDS Skin   Extensive Assist Verbally Surgical wounds                       Personal Care Assistance Level of Assistance  Bathing, Feeding, Dressing Bathing Assistance: Limited assistance Feeding assistance: Independent Dressing Assistance: Limited assistance     Functional Limitations Info  Sight, Hearing, Speech Sight Info: Adequate Hearing Info: Adequate Speech Info: Adequate    SPECIAL CARE FACTORS FREQUENCY  PT (By licensed PT), OT (By licensed OT)     PT Frequency: 5x week OT Frequency: 3x week            Contractures Contractures Info: Not present    Additional Factors Info  Code Status, Allergies Code Status Info: Full Allergies Info: NKA           Current Medications (06/01/2022):  This is the current hospital active medication list Current Facility-Administered Medications  Medication Dose Route Frequency Provider Last Rate Last Admin   0.9 %  sodium chloride infusion (Manually program via Guardrails IV Fluids)   Intravenous Once Tat, David, MD   Stopped at 05/29/22 1847   0.9 %  sodium chloride infusion (Manually program via Guardrails IV Fluids)   Intravenous Once Tat, David, MD       albumin human 25 % solution 25 g  25 g Intravenous Q1H PRN Virl Cagey, MD       alteplase (CATHFLO ACTIVASE) injection 2 mg  2 mg Intracatheter Once PRN Virl Cagey, MD       Chlorhexidine Gluconate Cloth 2 % PADS  6 each  6 each Topical Daily Tat, David, MD   6 each at 05/31/22 0830   Chlorhexidine Gluconate Cloth 2 % PADS 6 each  6 each Topical Q0600 Corliss [place], MD   6 each at 05/31/22 0557   Darbepoetin Alfa (ARANESP) injection 200 mcg  200 mcg Subcutaneous Q Tue-1800 Virl Cagey, MD       fluconazole (DIFLUCAN) IVPB 200 mg  200 mg Intravenous Q24H Erenest Blank, RPH 100 mL/hr at 06/01/22 0334 200 mg at 06/01/22 0334   heparin injection 1,000 Units  1,000 Units Intracatheter PRN Virl Cagey, MD   1,000 Units at 05/29/22 1510   hydrALAZINE (APRESOLINE) injection 5 mg  5 mg Intravenous Franco Collet, MD   5 mg at 06/01/22 2595   HYDROmorphone (DILAUDID) injection 0.5 mg  0.5 mg Intravenous Q3H PRN Virl Cagey, MD   0.5 mg at 06/01/22 0830   insulin aspart (novoLOG) injection 0-9 Units  0-9 Units Subcutaneous Q8H Lyndee Leo, [facility]   2 Units at 06/01/22 0831   ipratropium-albuterol (DUONEB) 0.5-2.5 (3) MG/3ML nebulizer solution 3 mL  3 mL Nebulization Q4H PRN Virl Cagey, MD       labetalol (NORMODYNE) injection 10 mg  10 mg Intravenous Q4H PRN Virl Cagey, MD   10 mg at 05/24/22 0809   linezolid (ZYVOX) IVPB 600 mg  600 mg Intravenous Therisa Doyne, MD 300 mL/hr at 06/01/22 0836 600 mg at 06/01/22 0836   methylPREDNISolone sodium succinate (SOLU-MEDROL) 40 mg/mL injection 40 mg  40 mg Intravenous Daily Corliss [place], MD   40 mg at 06/01/22 0830   midazolam (VERSED) injection 0.5 mg  0.5 mg Intravenous Q2H PRN Virl Cagey, MD       pantoprazole (PROTONIX) injection 40 mg  40 mg Intravenous Q12H Virl Cagey, MD   40 mg at 06/01/22 0830   prochlorperazine (COMPAZINE) injection 10 mg  10 mg Intravenous Q6H PRN Virl Cagey, MD   10 mg at 05/25/22 1820   TPN ADULT (ION)   Intravenous Continuous TPN Lyndee Leo, RPH 80 mL/hr at 06/01/22 6387 Infusion Verify at 06/01/22 0437   TPN ADULT (ION)   Intravenous Continuous TPN Tat, Shanon Brow, MD         Discharge Medications: Please see discharge summary for a list of discharge medications.  Relevant Imaging Results:  Relevant Lab Results:   Additional Information SSN: [place] [facility] 11:30  Shade Flood, LCSW

## 2022-06-01 NOTE — Progress Notes (Signed)
Subjective: Reports she is feeling okay today.  Did not feel yesterday with having dialysis.  She is having for a better day today.  No significant abdominal pain.  Reports occasional intermittent mild sharp pain around the incision site.  No persistent abdominal pain.  No nausea or vomiting.  No bowel movement overnight or today.  She did have bowel movement yesterday.  No BRBPR or melena that she is aware of.  Nursing did not report any GI bleeding either.   Objective: Vital signs in last 24 hours: Temp:  [98 F (36.7 C)-98.8 F (37.1 C)] 98 F (36.7 C) (01/30 1010) Pulse Rate:  [83-98] 90 (01/30 1010) Resp:  [11-20] 20 (01/30 1010) BP: (122-198)/(51-76) 198/76 (01/30 1010) SpO2:  [96 %-100 %] 100 % (01/30 1010) Weight:  [63.8 kg-65.1 kg] 63.8 kg (01/29 2054) Last BM Date : 05/31/22 General:   Alert and oriented, pleasant, NAD, dark fluid in NG collection container, more brown/green in tubing, no red blood.  Head:  Normocephalic and atraumatic. Eyes:  No icterus, sclera clear. Conjuctiva pink.  Abdomen:  Bowel sounds present, soft, non-tender, non-distended. Abdominal binder in place, midline incision with honeycomb dressing in place which has slight amount of dried blood at the bottom. JP drain dressing is dry. JP drain has a small amount of blood mixed in serous output. No rebound or guarding.  Msk:  Symmetrical without gross deformities. Normal posture. Extremities:  Without edema.  SCDs in place. Neurologic:  Alert and  oriented x4;  grossly normal neurologically. Psych:   Normal mood and affect.  Intake/Output from previous day: 01/29 0701 - 01/30 0700 In: 967.2 [I.V.:867.2; IV Piggyback:100] Out: 3000 [Emesis/NG output:550; Drains:950] Intake/Output this shift: Total I/O In: -  Out: 450 [Urine:150; Emesis/NG output:200; Drains:100]  Lab Results: Recent Labs    05/30/22 0751 05/30/22 1117 05/31/22 1133 06/01/22 0404 06/01/22 0700  WBC 17.8*  --   --  14.1*  --    HGB 7.7*   < > 7.1* 6.4* 5.5*  HCT 24.5*   < > 22.5* 20.6* 17.4*  PLT 132*  --   --  126*  --    < > = values in this interval not displayed.   BMET Recent Labs    05/30/22 0500 05/31/22 0515 06/01/22 0404  NA 135 132* 132*  K 4.6 5.4* 3.7  CL 99 100 98  CO2 '25 22 26  '$ GLUCOSE 146* 160* 194*  BUN 60* 87* 53*  CREATININE 1.88* 2.34* 1.70*  CALCIUM 6.7* 7.3*  7.0* 6.8*   LFT Recent Labs    05/30/22 0500 05/31/22 0515 06/01/22 0404  PROT 4.0* 4.3*  --   ALBUMIN 1.7* 1.9* 1.8*  AST 17 32  --   ALT 5 13  --   ALKPHOS 61 77  --   BILITOT 0.3  --   --     Assessment: 72 year old lady with a history of COPD, RA, CKD Stage IV who was admitted with pneumonia and worsening renal function 05/16/22 after recent discharge from OSH on 05/12/22. GI consulted for further evaluation of worsening anemia and rectal bleeding.   Acute blood loss anemia/perforated duodenal ulcer:  In the setting of peptic ulcer disease with 2 nonbleeding cratered gastric ulcers, multiple duodenal ulcers, 2 of which had adherent clots that were removed and revealed 2 vessels.  1 vessel was clipped, cauterized, and treated with epinephrine injection.  Second vessel was clipped and treated with cautery, clip fell off.  She later developed epigastric abdominal pain, found to have perforated duodenal ulcer and underwent emergent exploratory laparotomy on 1/26.  She has received 7 units PRBCs this admission and hemoglobin seems to be stabilizing around 7, but hemoglobin declined as low as 5.5 this morning.  Patient denies any overt GI bleeding.  No significant abdominal pain.  NG tube output is dark in collection container, but brown/green in tubing with no red blood noted.  JP drain does have a small amount of blood mixed in serous drainage.   Unclear why hemoglobin has dropped 1.5 g over the last 24 hours.  Would recommend repeat CT angiography if she has any overt GI bleeding.  No role for repeat endoscopy at this  time.  Agree with transfusing PRBCs today and monitoring.  Surgery will also see patient in follow-up today.  Will defer to them regarding JP drain output.  Regarding PUD, etiology isn't clear at this time as she denied NSAIDs. We will continue PPI BID and plan for empiric H pylori treatment outpatient.  We will also check fasting gastrin level tomorrow morning.   Plan: Agree with transfusing 2 units PRBCs. Continue to monitor H&H and for overt GI bleeding. Check gastrin level tomorrow morning.  Continue Protonix 40 mg twice daily x 3 months, then decrease to once daily. Will need empiric H. pylori treatment after discharge. Consider surveillance EGD later this year. Appreciate general surgery assistance this admission.    LOS: 16 days    06/01/2022, 10:16 AM   Aliene Altes, [facility]

## 2022-06-01 NOTE — Progress Notes (Addendum)
PHARMACY - TOTAL PARENTERAL NUTRITION CONSULT NOTE   Indication:  perforate ulcer, unable to meet nutritional needs orally/enterally  Patient Measurements: Height: '4\' 11"'$  (149.9 cm) Weight: 63.8 kg (140 lb 10.5 oz) IBW/kg (Calculated) : 43.2 TPN AdjBW (KG): 46.7 Body mass index is 28.41 kg/m. Usual Weight: 57kg  Assessment: 72 year old female originally admitted with pneumonia and AKI on 1/14. Admission complicated with anemia requiring colonoscopy and EGG. Pain returned 1/25, intraperitoneal gas and fluid on CT 1/25 concerning for perforation. Patient went for emergent exlap overnight and omental patch repair was done. Patient will remain npo and likely need TPN for a week per surgery. UGI study planned for 1/31.    Glucose / Insulin: CBGs 171-252, 12 units of insulin/24 hours. Patient continues on steroids Electrolytes: CoCa = 8.6, Po4 3.9, Mg 2.6 potassium 3.7  Na 132 Renal: on IHD TTS prior to admit - off schedule currently Hepatic: TG 178 Intake / Output; MIVF: inaccurate, no MIVF 600 ml from IV Zyvox and 100 mL from IV fluconazole every 24 hours.- will monitor  GI Imaging: ABD [place] 1/23 CT 1/25 GI Surgeries / Procedures:  Colonoscopy 1/20 EGD 1/20 Emergent Ex-lap with omental patch 1/26  Central access: CVC placed 1/26 TPN start date: 1/26  Nutritional Goals: Goal TPN rate is 80 mL/hr (provides 92 g of protein and 1812 kcals per day)  RD Assessment: Estimated Needs Total Energy Estimated Needs: 1800-1900 Total Protein Estimated Needs: 90-98 gr Total Fluid Estimated Needs: 1000 ml plus urine output  Current Nutrition:  NPO  Plan:  Continue TPN to 78m/hr  Electrolytes in TPN: Na 612m/L, K 30 mEq/L, Ca 43m33mL, Mg 3 mEq/L, and Phos 10 mmol/L. Cl:Ac 1:1 Add standard MVI, folic acid, and trace elements to TPN, chromium removed due to renal disease Add 10 units insulin  Continue Sensitive q8h SSI and adjust as needed  Monitor TPN labs on Mon/Thurs  SteMargot AblesPharmD Clinical Pharmacist 06/01/2022 8:44 AM

## 2022-06-01 NOTE — Progress Notes (Signed)
   06/01/22 1648  Vitals  Vital Signs Type (Include Temp, Pulse, RR, and B/P) Blood transfusion completion (within 30 minutes)  Temp 98.4 F (36.9 C)  Pulse Rate 97  Resp 20  BP (!) 199/50  Oxygen Therapy  SpO2 97 %  O2 Device Room Air

## 2022-06-01 NOTE — Progress Notes (Signed)
[facility]  H&H drifting, melena. Feels ok. JP is dumping a lot, serous fluid but ~1000L yesterday.   BP (!) 199/50   Pulse 91   Temp 98.7 F (37.1 C) (Oral)   Resp 18   Ht '4\' 11"'$  (1.499 m)   Wt 63.8 kg   SpO2 97%   BMI 28.41 kg/m  Midline c/d/I with honeycomb, JP with serous fluid no bile  Patient s/p perforated duodenal ulcer and graham patch with drain. Doing well but H&H drifting again and melena. Getting blood.   PRN For pain NPO, NG TPN UGI 1/31 Zyvox and diflucan for the intraperitoneal contamination and VRE UTI.    Curlene Labrum, MD [facility] [address] [place], [place] [address] [phone] (office)

## 2022-06-01 NOTE — Progress Notes (Signed)
PROGRESS NOTE  Sheri Cooper YIR:[id number] DOB: 26-May-1950 DOA: 05/16/2022 PCP: [facility]  Brief History:  72 y.o. female with medical history significant of COPD, rheumatoid arthritis, stage V CKD. Admitted on 05/16/2022 with healthcare associated pneumonia and worsening renal function with anion gap metabolic acidosis.  -Patient was recently discharged from [facility] on 05/12/2022 after treatment for COPD.  Nephrology was consulted.  Her hospitalization was prolonged secondary to HCAP and progressive renal failure.  She finished intravenous cefepime on 05/20/2022.  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  She was started on steroids by nephrology.  Pt was started on hemodialysis on 05/18/22 after temporary HD cath placement and had renal biopsy done on 05/19/22.  On 05/24/2022, tunneled dialysis catheter was placed.  An outpatient dialysis center was ultimately found for the patient.  Her first outpatient dialysis will be scheduled on 06/01/2022. Her hospitalization was prolonged secondary to GI bleed.  She was noted to have melena and hematochezia.  She was transfused 3 units total for the hospitalization.  GI was consulted.  On 05/22/2022, the patient underwent EGD and colonoscopy.  Results are discussed below.  Her hemoglobin stabilized.   On the evening of 05/27/22, pt had another large maroon stool.  Pt remains hemodynamically stable.  Case was discussed with GI, Dr. Jenetta Downer.  Case also discussed with nephrology.  It was felt that the benefit was greater than the risk of obtaining CTA of the abdomen and pelvis.   1/25 CTA showed intraperitoneal gas and fluid concerning for visceral perforation.  Based upon the location it was felt that it was likely coming from the first-second portion of the duodenum.  There is no acute bleeding.  General surgery was consulted.  Dr. Constance Haw took the patient to the OR for exploratory laparotomy.  Omental patch  repair was done.  The patient was left intubated overnight. She was extubated on 05/28/22.  She was initially on vasopressors which have been weaned off.  She remained NPO postop, and she was started on TPN. GI was reconsulted but did not feel she had any further active bleeding. Hgb continued to trend down over next 3 days.  After discussion with renal and GI, repeat CT angio abd/pelvis on 1/30.  Assessment/Plan: AKI on CKD 3b -ANCA, ANA, anti-GBM neg, C3/C4 WNL. HIV, Hep B neg. FLC within acceptable limits. SPEP pending  -Anion gap metabolic acidosis noted and sodium bicarbonate -patient sees Dr. Theador Hawthorne as outpatient---discussed with Dr. Lars Masson was concerned that patient would need hemodialysis this admission which has now been started -[facility] records from [place] [facility] reviewed -appreciate [facility] follow up -first HD 05/18/22 -Right IJ HD catheter placed by Dr. 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   -continue steroids for now>> convert IV Solu-Medrol until able to take p.o. -Last HD 05/31/22   Sepsis -secondary to UTI and peritonitis -on vasopressors for brief time>>weaned off -zosyn started for peritonitis>>d/c after cultures returnd -zyvox for VRE in urine and peritoneum   Pneumoperitoneum/Peritonitis -Noted on CTA abdomen 05/27/2022 -Location likely in the first-second portion of the duodenum -05/27/22 Exploratory laparotomy with omental patch -Remain n.p.o. and start TPN per general surgery -Convert essential medications to IV if possible -continue zosyn and fluconazole -E faecium  in peritoneal culture>>zyvox added -plan for UGI study 06/02/22   UTI -UR >50 WBC -urine culture = VRE -zyvox started   Acute respiratory  failure with hypoxia -Patient left intubated postoperatively -Consult pulmonary for management -extubated 1/26   Guaiac Positive Stools / GI bleeding/ ABLA Bleeding duodenal ulcers s/p clip x1, epinephrine and coagulation treatments Esophagitis and Gastritis  -large bloody BM noted by RN guaiac positive on 1/18 -DC'd subcutaneous heparin on 1/18 -transfused 1 unit PRBC with HD on 1/18, Hg down again to 6.1, transfused 2 unit PRBC 1/19 with HD  -GI team consulted to eval 1/19 -transfused 2 units PRBC with HD 1/19, Hg up to 9.8 -EGD / Colonoscopy planned for 05/22/22 with Dr. Jenetta Downer - with findings of grade C esophagitis and gastritis with ulcers, multiple duodenal ulcers with  adherent blood clots, bleeding vessel clip x 1, epinephrine injection given and APC; however second vessel could not be clipped. Currently on PP  -initially finished 72 hours IV pantoprazole drip>>IV bid -05/22/21 colonoscopy-blood in entire colon  -05/27/22--had another large maroon stool -05/27/2022 CT abdomen--no active GI bleed, but pneumoperitoneum found -GI reconsulted -Transfused 3 units PRBC (6 units total for admission) 1/25-1/26 -1/30 transfuse 2 unit PRBC (9 total) -discussed with GI and renal>>due to continued Hgb drifting down>>repeat CTA abd/pelvis   COPD  -- stable  -continue bronchodilators -Pt is on steroids per nephrology for AKI   Hypokalemia>>Hyperkalemia -repleted orally  -improved -now on HD   Thrombocytopenia  - stopped [place] heparin on 1/18 - checking HIT antibody labs - NEG - likely due to acute medical illness   Incidental finding of multiple renal lesions CT  of chest showed multiple renal lesions bilaterally, incompletely characterized on today's noncontrast CT examination, including an indeterminate 1.9 cm lesion in the anterior aspect of the interpolar region of the left kidney.  Follow-up nonemergent outpatient abdominal MRI with and without IV gadolinium is recommended in the  near future to definitively evaluate this lesion and exclude neoplasm   Elevated BNP--echo from 05/10/2022 at [facility] available in [place] reveals-  The left ventricle is normal in size with normal wall thickness. The left ventricular systolic function is normal, LVEF is visually estimated at 60-65%.  There is normal left ventricular diastolic function.  -There is mild to moderate pulmonary hypertension no significant valvular abnormalities -hemodialysis to manage volume status   Diarrhea - resolved now;  - c diff testing was negative              Family Communication:   brother updated   Consultants:  GI, renal, general surgery   Code Status:  FULL    DVT Prophylaxis:  SCDs     Procedures: As Listed in Progress Note Above   Antibiotics: Zosyn 1/26>>1/28 Fluconzole 1/26>> Zyvox 1/28>>               Subjective: Patient denies fevers, chills, headache, chest pain, dyspnea, nausea, vomiting, diarrhea, abdominal pain, dysuria, hematuria, hematochezia, and melena.   Objective: Vitals:   06/01/22 1344 06/01/22 1420 06/01/22 1430 06/01/22 1648  BP: (!) 199/50 (!) 198/43 (!) 198/43 (!) 199/50  Pulse: 91 94 94 97  Resp: '18 20 20 20  '$ Temp: 98.7 F (37.1 C) 98.5 F (36.9 C) 98.5 F (36.9 C) 98.4 F (36.9 C)  TempSrc: Oral Oral Oral   SpO2:  97% 97% 97%  Weight:      Height:        Intake/Output Summary (Last 24 hours) at 06/01/2022  [place] filed at 06/01/2022 1648 Gross per 24 hour  Intake 4186.21 ml  Output 3925 ml  Net 261.21 ml   Weight change: -0.1 kg Exam:  General:  Pt is alert, follows commands appropriately, not in acute distress HEENT: No icterus, No thrush, No neck mass, [place]/AT Cardiovascular: RRR, S1/S2, no rubs, no gallops Respiratory: bibasilar rales. No wheeze Abdomen: Soft/+BS, non tender, non distended, no guarding Extremities: No edema, No lymphangitis, No petechiae, No rashes, no synovitis   Data Reviewed: I  have personally reviewed following labs and imaging studies Basic Metabolic Panel: Recent Labs  Lab 05/28/22 0803 05/29/22 0508 05/30/22 0500 05/31/22 0515 06/01/22 0404 06/01/22 0850  NA 135 135 135 132* 132*  --   K 3.5 4.5 4.6 5.4* 3.7  --   CL 101 100 99 100 98  --   CO2 '23 24 25 22 26  '$ --   GLUCOSE 97 141* 146* 160* 194*  --   BUN 21 42* 60* 87* 53*  --   CREATININE 1.71* 2.55* 1.88* 2.34* 1.70*  --   CALCIUM 6.6* 7.3* 6.7* 7.3*  7.0* 6.8*  --   MG 1.5* 2.0 1.7 2.6*  --  1.8  PHOS 3.1 3.2 2.3* 3.8  3.7 3.9  --    Liver Function Tests: Recent Labs  Lab 05/28/22 0803 05/29/22 0508 05/30/22 0500 05/31/22 0515 06/01/22 0404  AST 24  --  17 32  --   ALT 6  --  5 13  --   ALKPHOS 70  --  61 77  --   BILITOT 0.6  --  0.3  --   --   PROT 4.7*  --  4.0* 4.3*  --   ALBUMIN 2.1* 2.0* 1.7* 1.9* 1.8*   No results for input(s): "LIPASE", "AMYLASE" in the last 168 hours. No results for input(s): "AMMONIA" in the last 168 hours. Coagulation Profile: No results for input(s): "INR", "PROTIME" in the last 168 hours. CBC: Recent Labs  Lab 05/26/22 0410 05/27/22 0420 05/27/22 1831 05/28/22 0714 05/28/22 1208 05/30/22 0751 05/30/22 1117 05/30/22 2312 05/31/22 0514 05/31/22 1133 06/01/22 0404 06/01/22 0700  WBC 28.8* 24.3*  --  18.6*  --  17.8*  --   --   --   --  14.1*  --   HGB 8.4* 8.0*   < > 10.5*   < > 7.7*   < > 7.3* 7.1* 7.1* 6.4* 5.5*  HCT 26.0* 24.6*   < > 31.7*   < > 24.5*   < > 22.9* 22.5* 22.5* 20.6* 17.4*  MCV 88.1 86.6  --  81.1  --  75.9*  --   --   --   --  76.6*  --   PLT 165 203  --  147*  --  132*  --   --   --   --  126*  --    < > = values in this interval not displayed.   Cardiac Enzymes: No results for input(s): "CKTOTAL", "CKMB", "CKMBINDEX", "TROPONINI" in the last 168 hours. BNP: Invalid input(s): "POCBNP" CBG: Recent Labs  Lab 05/31/22 1740 05/31/22 2331 06/01/22 0730 06/01/22 1107 06/01/22 1606  GLUCAP 252* 233* 171* 224* 229*    HbA1C: No results for input(s): "HGBA1C" in the last 72 hours. Urine analysis:    Component Value Date/Time   COLORURINE YELLOW 05/28/2022 1005   APPEARANCEUR CLOUDY (A) 05/28/2022 1005   LABSPEC 1.018 05/28/2022 1005   PHURINE 7.0 05/28/2022 1005  GLUCOSEU NEGATIVE 05/28/2022 1005   HGBUR SMALL (A) 05/28/2022 1005   BILIRUBINUR NEGATIVE 05/28/2022 1005   KETONESUR NEGATIVE 05/28/2022 1005   PROTEINUR >=300 (A) 05/28/2022 1005   NITRITE NEGATIVE 05/28/2022 1005   LEUKOCYTESUR MODERATE (A) 05/28/2022 1005   Sepsis Labs: '@LABRCNTIP'$ (procalcitonin:4,lacticidven:4) ) Recent Results (from the past 240 hour(s))  Aerobic/Anaerobic Culture w Gram Stain (surgical/deep wound)     Status: None (Preliminary result)   Collection Time: 05/27/22 11:45 PM   Specimen: PATH Cytology Peritoneal fluid; Body Fluid  Result Value Ref Range Status   Specimen Description   Final    PERITONEAL FLUID Performed at [facility], [address] [place], [place] [address]    Special Requests   Final    NONE Performed at [facility], [address] [place], [place] [address]    Gram Stain   Final    RARE WBC PRESENT, PREDOMINANTLY PMN NO ORGANISMS SEEN Performed at [facility], [place] [address] [place], [place] [address]    Culture   Final    FEW ENTEROCOCCUS FAECIUM VANCOMYCIN RESISTANT ENTEROCOCCUS ISOLATED NO ANAEROBES ISOLATED; CULTURE IN PROGRESS FOR 5 DAYS    Report Status PENDING  Incomplete   Organism ID, Bacteria ENTEROCOCCUS FAECIUM  Final      Susceptibility   Enterococcus faecium - MIC*    AMPICILLIN >=32 RESISTANT Resistant     VANCOMYCIN >=32 RESISTANT Resistant     GENTAMICIN SYNERGY SENSITIVE Sensitive     LINEZOLID 2 SENSITIVE Sensitive     * FEW ENTEROCOCCUS FAECIUM  Urine Culture (for pregnant, neutropenic or urologic patients or patients with an indwelling urinary catheter)     Status: Abnormal   Collection Time: 05/28/22 10:05 AM   Specimen: Urine, Catheterized   Result Value Ref Range Status   Specimen Description   Final    URINE, CATHETERIZED Performed at [facility], [address] [place], [place] [address]    Special Requests   Final    NONE Performed at [facility], [address] [place],  [address]    Culture (A)  Final    >=100,000 COLONIES/mL ENTEROCOCCUS FAECIUM VANCOMYCIN RESISTANT ENTEROCOCCUS ISOLATED    Report Status 05/30/2022 FINAL  Final   Organism ID, Bacteria ENTEROCOCCUS FAECIUM (A)  Final      Susceptibility   Enterococcus faecium - MIC*    AMPICILLIN >=32 RESISTANT Resistant     NITROFURANTOIN 64 INTERMEDIATE Intermediate     VANCOMYCIN >=32 RESISTANT Resistant     LINEZOLID 2 SENSITIVE Sensitive     * >=100,000 COLONIES/mL ENTEROCOCCUS FAECIUM     Scheduled Meds:  sodium chloride   Intravenous Once   Chlorhexidine Gluconate Cloth  6 each Topical Daily   Chlorhexidine Gluconate Cloth  6 each Topical Q0600   darbepoetin (ARANESP) injection - DIALYSIS  200 mcg Subcutaneous Q Tue-1800   hydrALAZINE  5 mg Intravenous Q8H   insulin aspart  0-9 Units Subcutaneous Q8H   methylPREDNISolone (SOLU-MEDROL) injection  40 mg Intravenous Daily   pantoprazole  40 mg Intravenous Q12H   Continuous Infusions:  albumin human     fluconazole (DIFLUCAN) IV 200 mg (06/01/22 0334)   linezolid (ZYVOX) IV 600 mg (06/01/22 0836)   TPN ADULT (ION) 80 mL/hr at 06/01/22 0437   TPN ADULT (ION)      Procedures/Studies: DG Chest Port 1 View  Result Date: 05/28/2022 CLINICAL DATA:  Respiratory failure EXAM: PORTABLE CHEST 1 VIEW COMPARISON:  05/25/2022 FINDINGS: Endotracheal tube is seen 3.5 cm  above the carina. Nasogastric tube extends into the upper abdomen beyond the margin of the examination. Left internal jugular central venous catheter tip within the superior vena cava. Right internal jugular hemodialysis catheter tip noted within the right atrium. The lungs are symmetrically well expanded. Partial left lower lobe collapse  noted, however, with retrocardiac consolidation. Mild relative opacification the right hemithorax suggest the presence of a posteriorly layering pleural effusion on this supine examination. Superimposed trace perihilar interstitial pulmonary infiltrate is in keeping with trace probable interstitial pulmonary edema. No pneumothorax. Cardiac size is within normal limits. IMPRESSION: 1. Support lines and tubes in appropriate position. 2. Partial left lower lobe collapse. 3. Suspected posteriorly layering right pleural effusion. 4. Trace probable interstitial pulmonary edema. Electronically Signed   By: Fidela Salisbury M.D.   On: 05/28/2022 03:32   CT ANGIO GI BLEED  Result Date: 05/27/2022 CLINICAL DATA:  Recurrent gastrointestinal hemorrhage EXAM: CTA ABDOMEN AND PELVIS WITHOUT AND WITH CONTRAST TECHNIQUE: Multidetector CT imaging of the abdomen and pelvis was performed using the standard protocol during bolus administration of intravenous contrast. Multiplanar reconstructed images and MIPs were obtained and reviewed to evaluate the vascular anatomy. RADIATION DOSE REDUCTION: This exam was performed according to the departmental dose-optimization program which includes automated exposure control, adjustment of the mA and/or kV according to patient size and/or use of iterative reconstruction technique. CONTRAST:  67m OMNIPAQUE IOHEXOL 350 MG/ML SOLN COMPARISON:  None Available. FINDINGS: VASCULAR Aorta: Moderate mixed atherosclerotic plaque. No aortic aneurysm or dissection. No periaortic inflammatory change identified. Celiac: Patent without evidence of aneurysm, dissection, vasculitis or significant stenosis. SMA: Patent without evidence of aneurysm, dissection, vasculitis or significant stenosis. Renals: High-grade (near occlusion) stenosis of the a left renal artery origin. Less than 50% stenosis of the right renal artery origin. Normal vascular morphology. Relatively diminutive caliber of the left renal  arterial vasculature distal the stenosis may relate to under filling. No aneurysm or dissection. IMA: Occluded at its origin with reconstitution via the marginal artery. 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 Focal irregular minimum 50% stenosis of the left common iliac artery secondary to a short segment dissection flap within the proximal left common iliac artery. Right internal iliac artery is heavily diseased and occluded at its origin with subsequent reconstitution. High-grade focal stenosis of the left internal iliac artery at its origin. Proximal Outflow: Bilateral common femoral and visualized portions of the superficial and profunda femoral arteries are patent without evidence of aneurysm, dissection, vasculitis or significant stenosis. Veins: Mesenteric, splenic, portal venous, and hepatic venous structures are patent. Iliofemoral veins are patent. Renal veins and inferior vena cava are patent. Review of the MIP images confirms the above findings. NON-VASCULAR Lower chest: Small left and moderate right pleural effusions are present with bibasilar compressive atelectasis. Hepatobiliary: Cholelithiasis without pericholecystic inflammatory change. Liver unremarkable. No intra or extrahepatic biliary ductal dilation. Pancreas: Unremarkable Spleen: Unremarkable Adrenals/Urinary Tract: The adrenal glands are unremarkable. Moderate left renal cortical atrophy. Right kidney is normal in size. 6 mm plaque-like nonobstructing calculus noted within the upper pole the right kidney. Multiple simple cortical cysts are seen within the right kidney. Hypodense 16 mm lesion is seen within the interpolar region of the right kidney demonstrates no significant enhancement is compatible with a Bosniak class 2 cyst. No follow-up imaging is recommended for these lesions. No hydronephrosis. The bladder is unremarkable. Stomach/Bowel: There is mild free intraperitoneal gas present. The  exact site of visceral perforation is not clearly  identified on this examination, however, there is extraluminal gas and debris surrounding the first and proximal second portion of the duodenum raising the question of a perforation in this location. Endoscopic clip noted within the juncture of the first and second portion of the duodenum. Moderate free intraperitoneal fluid. Endoscopic clip laying dependently within the cecum. The stomach, small bowel, and large bowel are otherwise unremarkable. No active gastrointestinal hemorrhage identified. Fluid within the descending colon and rectal vault may present clinically as diarrhea. Lymphatic: No pathologic adenopathy within the abdomen and pelvis. Reproductive: Involuted fibroid within the uterus. The pelvic organs are otherwise unremarkable. Other: Moderate subcutaneous body wall edema noted dependently. Musculoskeletal: No acute bone abnormality. No lytic or blastic bone lesion. IMPRESSION: VASCULAR . 1. 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 2. 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 Focal irregular minimum 50% stenosis of the left common iliac artery secondary to a short segment dissection flap. 3. 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 High-grade focal stenosis of the left internal iliac artery at its origin. 4. 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 NON-VASCULAR . 1. Moderate free intraperitoneal gas and fluid in keeping with visceral perforation. The exact site is not clearly identified on this examination, however, more focal debris and gas adjacent to the juncture of the first and second portion of the duodenum raises the question of a perforation in this location. 2. No active gastrointestinal hemorrhage identified 3.  Cholelithiasis. 4. Mild right nonobstructing nephrolithiasis. 5. Fluid within the descending colon and rectal vault may present clinically as diarrhea. Aortic Atherosclerosis (ICD10-I70.0). Electronically Signed   By: Fidela Salisbury M.D.   On: 05/27/2022 21:51   DG CHEST PORT 1 VIEW  Result Date: 05/25/2022 CLINICAL DATA:  Chest pain EXAM: PORTABLE CHEST 1 VIEW COMPARISON:  05/22/2022 FINDINGS: Right internal jugular approach hemodialysis catheter terminates at the level of the right atrium. Stable heart size. Small right pleural effusion with associated right basilar opacity, similar to prior. Similar prominent interstitial markings bilaterally. No pneumothorax. IMPRESSION: 1. Small right pleural effusion with associated right basilar opacity, similar to prior. 2. Similar prominent interstitial markings bilaterally, likely mild edema. Electronically Signed   By: Davina Poke D.O.   On: 05/25/2022 11:39   DG Abd 1 View  Result Date: 05/25/2022 CLINICAL DATA:  Abdominal pain EXAM: ABDOMEN - 1 VIEW COMPARISON:  05/22/2022 FINDINGS: The bowel gas pattern is normal. No radio-opaque calculi or other significant radiographic abnormality are seen. Surgical clips overlie the pelvis and right upper quadrant. Calcification consistent with fibroid noted. IMPRESSION: Unremarkable bowel gas pattern. Calcified fibroid incidentally noted Electronically Signed   By: Sammie Bench M.D.   On: 05/25/2022 08:22   IR Fluoro Guide CV Line Right  Result Date: 05/24/2022 INDICATION: 72 year old female in need of durable dialysis access. She presents for removal temporary dialysis catheter in placement of a tunneled dialysis catheter. EXAM: TUNNELED CENTRAL VENOUS HEMODIALYSIS CATHETER PLACEMENT WITH ULTRASOUND AND FLUOROSCOPIC GUIDANCE MEDICATIONS: 2 g Ancef. The antibiotic was given in an appropriate time interval prior to skin puncture. ANESTHESIA/SEDATION: Moderate (conscious) sedation was employed during this  procedure. A total of Versed 1 mg and Fentanyl 25 mcg was administered intravenously. Moderate Sedation Time: 11 minutes. The patient's level of consciousness and vital signs were monitored continuously by radiology nursing throughout the procedure under  my direct supervision. FLUOROSCOPY TIME:  Radiation exposure index: 1 mGy reference air kerma COMPLICATIONS: None immediate. PROCEDURE: Informed written consent was obtained from the patient after a discussion of the risks, benefits, and alternatives to treatment. Questions regarding the procedure were encouraged and answered. The right neck and chest were prepped with chlorhexidine in a sterile fashion, and a sterile drape was applied covering the operative field. Maximum barrier sterile technique with sterile gowns and gloves were used for the procedure. A timeout was performed prior to the initiation of the procedure. After creating a small venotomy incision, a micropuncture kit was utilized to access the right internal jugular vein under direct, real-time ultrasound guidance after the overlying soft tissues were anesthetized with 1% lidocaine with epinephrine. Ultrasound image documentation was performed. The microwire was kinked to measure appropriate catheter length. A stiff Glidewire was advanced to the level of the IVC and the micropuncture sheath was exchanged for a peel-away sheath. A palindrome tunneled hemodialysis catheter measuring 19 cm from tip to cuff was tunneled in a retrograde fashion from the anterior chest wall to the venotomy incision. The catheter was then placed through the peel-away sheath with tips ultimately positioned within the superior aspect of the right atrium. Final catheter positioning was confirmed and documented with a spot radiographic image. The catheter aspirates and flushes normally. The catheter was flushed with appropriate volume heparin dwells. The catheter exit site was secured with a 0-Prolene retention suture. The  venotomy incision was closed with an interrupted 4-0 Vicryl, Dermabond and Steri-strips. Dressings were applied. The patient tolerated the procedure well without immediate post procedural complication. IMPRESSION: Successful placement of 19 cm tip to cuff tunneled hemodialysis catheter via the right internal jugular vein with tips terminating within the superior aspect of the right atrium. The catheter is ready for immediate use. Electronically Signed   By: Jacqulynn Cadet M.D.   On: 05/24/2022 12:39   IR US Guide Vasc Access Right  Result Date: 05/24/2022 INDICATION: 72 year old female in need of durable dialysis access. She presents for removal temporary dialysis catheter in placement of a tunneled dialysis catheter. EXAM: TUNNELED CENTRAL VENOUS HEMODIALYSIS CATHETER PLACEMENT WITH ULTRASOUND AND FLUOROSCOPIC GUIDANCE MEDICATIONS: 2 g Ancef. The antibiotic was given in an appropriate time interval prior to skin puncture. ANESTHESIA/SEDATION: Moderate (conscious) sedation was employed during this procedure. A total of Versed 1 mg and Fentanyl 25 mcg was administered intravenously. Moderate Sedation Time: 11 minutes. The patient's level of consciousness and vital signs were monitored continuously by radiology nursing throughout the procedure under my direct supervision. FLUOROSCOPY TIME:  Radiation exposure index: 1 mGy reference air kerma COMPLICATIONS: None immediate. PROCEDURE: Informed written consent was obtained from the patient after a discussion of the risks, benefits, and alternatives to treatment. Questions regarding the procedure were encouraged and answered. The right neck and chest were prepped with chlorhexidine in a sterile fashion, and a sterile drape was applied covering the operative field. Maximum barrier sterile technique with sterile gowns and gloves were used for the procedure. A timeout was performed prior to the initiation of the procedure. After creating a small venotomy incision, a  micropuncture kit was utilized to access the right internal jugular vein under direct, real-time ultrasound guidance after the overlying soft tissues were anesthetized with 1% lidocaine with epinephrine. Ultrasound image documentation was performed. The microwire was kinked to measure appropriate catheter length. A stiff Glidewire was advanced to the level of the IVC and the micropuncture sheath was exchanged for a peel-away sheath.  A palindrome tunneled hemodialysis catheter measuring 19 cm from tip to cuff was tunneled in a retrograde fashion from the anterior chest wall to the venotomy incision. The catheter was then placed through the peel-away sheath with tips ultimately positioned within the superior aspect of the right atrium. Final catheter positioning was confirmed and documented with a spot radiographic image. The catheter aspirates and flushes normally. The catheter was flushed with appropriate volume heparin dwells. The catheter exit site was secured with a 0-Prolene retention suture. The venotomy incision was closed with an interrupted 4-0 Vicryl, Dermabond and Steri-strips. Dressings were applied. The patient tolerated the procedure well without immediate post procedural complication. IMPRESSION: Successful placement of 19 cm tip to cuff tunneled hemodialysis catheter via the right internal jugular vein with tips terminating within the superior aspect of the right atrium. The catheter is ready for immediate use. Electronically Signed   By: Jacqulynn Cadet M.D.   On: 05/24/2022 12:39   DG CHEST PORT 1 VIEW  Result Date: 05/22/2022 CLINICAL DATA:  Evaluate for intra-abdominal free air. EXAM: PORTABLE CHEST 1 VIEW COMPARISON:  Abdominal radiograph 05/22/2022 FINDINGS: Central venous catheter tip projects over the superior vena cava. Stable cardiac and mediastinal contours. Small layering right pleural effusion with underlying consolidation. No pneumothorax. No definite free air no definite gas  identified underlying the hemidiaphragms. IMPRESSION: 1. Small layering right pleural effusion with underlying consolidation. 2. No definite free air identified underlying the hemidiaphragms. Consider further evaluation with decubitus imaging of the abdomen if there is persistent clinical concern given the limitations of this portable semi-erect chest radiograph. Electronically Signed   By: Lovey Newcomer M.D.   On: 05/22/2022 16:02   DG Abd 1 View  Result Date: 05/22/2022 CLINICAL DATA:  Postoperative abdominal pain. EXAM: ABDOMEN - 1 VIEW COMPARISON:  None Available. FINDINGS: Gas is demonstrated within nondilated loops of large and small bowel in a nonobstructed pattern. Supine evaluation limited for the detection of free intraperitoneal air. Lumbar spine degenerative changes. Calcific density projecting over the pelvis, nonspecific, potentially calcified uterine fibroid. IMPRESSION: Nonobstructed bowel-gas pattern. Electronically Signed   By: Lovey Newcomer M.D.   On: 05/22/2022 14:08   DG CHEST PORT 1 VIEW  Result Date: 05/21/2022 CLINICAL DATA:  Leukocytosis EXAM: PORTABLE CHEST 1 VIEW COMPARISON:  05/18/2022 FINDINGS: Central venous line tip in distal SVC. Normal cardiac silhouette. Small RIGHT effusion. Mild venous congestion. No pneumothorax. No focal consolidation. Lymphadenectomy clips in the LEFT axilla. No significant interval change. IMPRESSION: 1. No significant interval change. 2. Small RIGHT effusion. 3. Mild venous congestion. Electronically Signed   By: Suzy Bouchard M.D.   On: 05/21/2022 08:22   US BIOPSY (KIDNEY)  Result Date: 05/19/2022 INDICATION: 72 year old female with history of end-stage renal disease. EXAM: ULTRASOUND GUIDED RENAL BIOPSY COMPARISON:  None Available. MEDICATIONS: None. ANESTHESIA/SEDATION: Fentanyl 25 mcg IV; Versed 0.5 mg IV Total Moderate Sedation time: 10 minutes; The patient was continuously monitored during the procedure by the interventional radiology nurse  under my direct supervision. COMPLICATIONS: None immediate. PROCEDURE: Informed written consent was obtained from the patient after a discussion of the risks, benefits and alternatives to treatment. The patient understands and consents the procedure. A timeout was performed prior to the initiation of the procedure. Ultrasound scanning was performed of the bilateral flanks. The inferior pole of the left kidney was selected for biopsy due to location and sonographic window. The procedure was planned. The operative site was prepped and draped in the usual sterile fashion. The overlying  soft tissues were anesthetized with 1% lidocaine with epinephrine. A 17 gauge coaxial introducer needle was advanced into the inferior cortex of the left kidney and 2 core biopsies were obtained under direct ultrasound guidance with a 16 gauge core biopsy device. Images were saved for documentation purposes. The biopsy device was removed and hemostasis was obtained with manual compression after injection of Gel-Foam slurry along the needle track under ultrasound guidance. Post procedural scanning was negative for significant post procedural hemorrhage or additional complication. A dressing was placed. The patient tolerated the procedure well without immediate post procedural complication. IMPRESSION: Technically successful ultrasound guided left renal biopsy. Ruthann Cancer, MD [facility] Rad[facility] Electronically Signed   By: Ruthann Cancer M.D.   On: 05/19/2022 14:19   DG Chest Port 1 View  Result Date: 05/18/2022 CLINICAL DATA:  Central line placement. EXAM: PORTABLE CHEST 1 VIEW COMPARISON:  CT chest and chest x-ray from 2 days ago. FINDINGS: New right internal jugular central venous catheter with tip at the cavoatrial junction. The heart size and mediastinal contours are within normal limits. Mild diffuse interstitial thickening similar. Decreasing small right and unchanged small  left pleural effusions. Increasing asymmetric density in the peripheral left upper lobe. Similar atelectasis in the peripheral right middle lobe. Improving right lower lobe atelectasis. No pneumothorax. No acute osseous abnormality. IMPRESSION: 1. New right internal jugular central venous catheter with tip at the cavoatrial junction. No pneumothorax. 2. Similar diffuse interstitial thickening with increasing asymmetric density in the peripheral left upper lobe. Differential considerations include infection or pulmonary edema. 3. Decreased small right and unchanged small left pleural effusions. Electronically Signed   By: Titus Dubin M.D.   On: 05/18/2022 14:06   US RENAL  Result Date: 05/18/2022 CLINICAL DATA:  Chronic kidney disease EXAM: RENAL / URINARY TRACT ULTRASOUND COMPLETE COMPARISON:  Abdomen CT report 01/13/2018 FINDINGS: Right Kidney: Renal measurements: 8.9 x 4.7 x 4.4 = volume: 97 mL. Kidney appears echogenic. No collecting system dilatation. There is a simple cyst along the midportion of the kidney which is anechoic with through transmission measuring 17 mm. Left Kidney: Renal measurements: 7.5 x 3.9 x 3.2 = volume: 49 mL. Small echogenic appearing kidney. No collecting system dilatation. Anechoic rounded structure seen in the mid kidney measuring 2 cm. Through transmission is smoothly marginated. Bladder: Appears normal for degree of bladder distention. Other: Scattered ascites.  Probable right-sided pleural effusion. IMPRESSION: Echogenic small appearing kidneys. No collecting system dilatation. Bilateral simple appearing renal cysts. Ascites.  Right pleural effusion Electronically Signed   By: Jill Side M.D.   On: 05/18/2022 11:07   CT Chest Wo Contrast  Result Date: 05/16/2022 CLINICAL DATA:  72 year old female with history of suspected pneumonia. EXAM: CT CHEST WITHOUT CONTRAST TECHNIQUE: Multidetector CT imaging of the chest was performed following the standard protocol without IV  contrast. RADIATION DOSE REDUCTION: This exam was performed according to the departmental dose-optimization program which includes automated exposure control, adjustment of the mA and/or kV according to patient size and/or use of iterative reconstruction technique. COMPARISON:  Chest x-ray 05/16/2022. FINDINGS: Cardiovascular: Heart size is normal. There is no significant pericardial fluid, thickening or pericardial calcification. Aortic atherosclerosis. No definite coronary artery calcifications. Mild calcifications of the aortic valve. Mediastinum/Nodes: No pathologically enlarged mediastinal or hilar lymph nodes. Please note that accurate exclusion of hilar adenopathy is limited on noncontrast CT scans. Esophagus is unremarkable in appearance. No axillary lymphadenopathy. Lungs/Pleura: Moderate to large right and small to moderate left  pleural effusions lying dependently associated with areas of passive subsegmental atelectasis in the lower lobes of the lungs bilaterally. In addition, there are mid to upper lung predominant patchy areas of ground-glass attenuation and nodular appearing consolidative airspace disease, likely to reflect multilobar bilateral bronchopneumonia. The largest of these nodular appearing regions in the apex of the left upper lobe (axial image 32 of series 4) measuring 1.3 x 1.2 cm. Upper Abdomen: Aortic atherosclerosis. Tiny partially calcified gallstones lying dependently in the fundus of the gallbladder. Small volume of ascites. Multiple renal lesions bilaterally, incompletely characterized on today's noncontrast CT examination, most notable for an exophytic intermediate attenuation lesion (25 HU) measuring 1.9 cm in the anterior aspect of the interpolar region of the left kidney. A cluster of nonobstructive calculi is also noted in the posterior aspect of the upper pole collecting system of the right kidney measuring up to 3 mm. Musculoskeletal: There are no aggressive appearing lytic  or blastic lesions noted in the visualized portions of the skeleton. IMPRESSION: 1. The appearance the chest is most suggestive of multilobar bilateral bronchopneumonia with bilateral parapneumonic pleural effusions, as above. 2. Given the nodular appearance of some of the areas of apparent airspace consolidation, follow-up noncontrast chest CT is recommended in 3 months to ensure resolution of the nodular areas following treatment for the patient's acute illness, to exclude underlying neoplasm. 3. Multiple renal lesions bilaterally, incompletely characterized on today's noncontrast CT examination, including an indeterminate 1.9 cm lesion in the anterior aspect of the interpolar region of the left kidney. Follow-up nonemergent outpatient abdominal MRI with and without IV gadolinium is recommended in the near future to definitively evaluate this lesion and exclude neoplasm. 4. Cholelithiasis. 5. Nonobstructive calculi in the right renal collecting system measuring up to 3 mm. 6. Ascites. 7. Aortic atherosclerosis. Aortic Atherosclerosis (ICD10-I70.0). Electronically Signed   By: Vinnie Langton M.D.   On: 05/16/2022 05:23   DG Chest Port 1 View  Result Date: 05/16/2022 CLINICAL DATA:  Shortness of breath EXAM: PORTABLE CHEST 1 VIEW COMPARISON:  05/07/2022 FINDINGS: Cardiac shadow is enlarged but stable. Small pleural effusion on the right is again noted. Some increased basilar density is noted on the right when compared with the prior study. Postsurgical changes in the left breast are seen. No bony abnormality is noted. IMPRESSION: Persistent right-sided effusion with slight increase in basilar infiltrate on the right. Electronically Signed   By: Inez Catalina M.D.   On: 05/16/2022 03:45    Orson Eva, DO  [facility]  If 7PM-7AM, please contact night-coverage [email or web address] Password [facility] 06/01/2022, 5:31 PM   LOS: 16 days

## 2022-06-01 NOTE — Progress Notes (Signed)
Patient blood pressure is 190/95. Patient given her scheduled hydralzine. Will continue to monitor.

## 2022-06-01 NOTE — Progress Notes (Signed)
Patient ID: Sheri Cooper, female   DOB: Jan 15, 1951, 72 y.o.   MRN: [id number] [facility] Progress Note   Assessment/ Plan:   1. 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  She has been on hemodialysis since 05/18/2022 for severe azotemia with development of uremic symptoms.  Started on treatment with corticosteroids; currently on Solu-Medrol 40 mg daily with plans to transition this to oral prednisone 60 mg daily thereafter (taper as outpatient under the supervision of Dr. Theador Hawthorne while being monitored on hemodialysis).  Last HD 1/29, monitoring daily for clear evidence of GFR recovery, none to date.  Tentative plan for HD tomorrow.  She has been accepted to an outpatient hemodialysis unit-[facility] and can start there as early as 06/01/2022, does not seem medically ready for discharge yet.  Remains on TPN, pharmacy changed electrolytes to 0 mEq K.  . 2.  Healthcare associated pneumonia: per [facility] 3.  Anemia: Likely secondary to chronic illness and recent GI bleed for which she had emergency exploratory laparotomy with omental patch for perforated DU.  Recommendations per gastroenterology for empiric H. pylori treatment noted. Transfuse per [facility]. TSAT was 40% 05/16/22. On ESA qTuesday 4.  Sepsis: Secondary to urinary tract infection and peritonitis from ruptured duodenal ulcer requiring emergent exploratory laparotomy.  Remains on TPN..  Transiently in ICU for pressors/intubated and now weaned off of pressors and remains on intravenous linezolid.  Hemodynamic stability noted.  Subjective:   HD yesterday 1.5L UF No current complaints Remains on TPN Hb 5.5 this AM   Objective:   BP (!) 194/52   Pulse 93   Temp 98.3 F (36.8 C)   Resp 13   Ht '4\' 11"'$  (1.499 m)   Wt 63.8 kg   SpO2 97%   BMI 28.41 kg/m   Intake/Output Summary (Last 24  hours) at 06/01/2022 0924 Last data filed at 06/01/2022 0708 Gross per 24 hour  Intake 967.18 ml  Output 3450 ml  Net -2482.82 ml    Weight change: -0.1 kg  Physical Exam: Gen: Appears comfortable resting in bed, NG tube in place CVS: Pulse regular rhythm, normal rate, S1 and S2 normal Resp: Poor inspiratory effort with decreased breath sounds at the bases, no rales/rhonchi Abd: Soft, JP drain in situ, tenderness to exam Ext: Trace ankle edema  Imaging: No results found.  Labs: BMET Recent Labs  Lab 05/27/22 0420 05/28/22 0714 05/28/22 0803 05/29/22 0508 05/30/22 0500 05/31/22 0515 06/01/22 0404  NA 136 135 135 135 135 132* 132*  K 4.5 3.4* 3.5 4.5 4.6 5.4* 3.7  CL 99 101 101 100 99 100 98  CO2 '26 24 23 24 25 22 26  '$ GLUCOSE 94 98 97 141* 146* 160* 194*  BUN 33* 22 21 42* 60* 87* 53*  CREATININE 2.98* 1.76* 1.71* 2.55* 1.88* 2.34* 1.70*  CALCIUM 7.4* 6.6* 6.6* 7.3* 6.7* 7.3*  7.0* 6.8*  PHOS 2.6 3.1 3.1 3.2 2.3* 3.8  3.7 3.9    CBC Recent Labs  Lab 05/27/22 0420 05/27/22 1831 05/28/22 0714 05/28/22 1208 05/30/22 0751 05/30/22 1117 05/31/22 0514 05/31/22 1133 06/01/22 0404 06/01/22 0700  WBC 24.3*  --  18.6*  --  17.8*  --   --   --  14.1*  --   HGB 8.0*   < > 10.5*   < > 7.7*   < > 7.1* 7.1* 6.4* 5.5*  HCT 24.6*   < > 31.7*   < > 24.5*   < > 22.5* 22.5* 20.6* 17.4*  MCV 86.6  --  81.1  --  75.9*  --   --   --  76.6*  --   PLT 203  --  147*  --  132*  --   --   --  126*  --    < > = values in this interval not displayed.     Medications:     sodium chloride   Intravenous Once   sodium chloride   Intravenous Once   Chlorhexidine Gluconate Cloth  6 each Topical Daily   Chlorhexidine Gluconate Cloth  6 each Topical Q0600   darbepoetin (ARANESP) injection - DIALYSIS  200 mcg Subcutaneous Q Tue-1800   hydrALAZINE  5 mg Intravenous Q8H   insulin aspart  0-9 Units Subcutaneous Q8H   methylPREDNISolone (SOLU-MEDROL) injection  40 mg Intravenous Daily    pantoprazole  40 mg Intravenous Q12H    Rexene Agent, MD  06/01/2022, 9:24 AM

## 2022-06-01 NOTE — Progress Notes (Signed)
Critical result   06/01/22 0551  Provider Notification  Provider Name/Title Dr Josephine Cables  Date Provider Notified 06/01/22  Time Provider Notified [phone]  Method of Notification Page  Notification Reason Critical Result  Test performed and critical result hbg 6.4  Date Critical Result Received 06/01/22  Time Critical Result Received [phone]

## 2022-06-02 ENCOUNTER — Inpatient Hospital Stay (HOSPITAL_COMMUNITY): Payer: Medicare Other

## 2022-06-02 DIAGNOSIS — K922 Gastrointestinal hemorrhage, unspecified: Secondary | ICD-10-CM | POA: Diagnosis not present

## 2022-06-02 DIAGNOSIS — D62 Acute posthemorrhagic anemia: Secondary | ICD-10-CM | POA: Diagnosis not present

## 2022-06-02 DIAGNOSIS — J189 Pneumonia, unspecified organism: Secondary | ICD-10-CM | POA: Diagnosis not present

## 2022-06-02 DIAGNOSIS — K659 Peritonitis, unspecified: Secondary | ICD-10-CM | POA: Diagnosis not present

## 2022-06-02 LAB — CBC
HCT: 34.6 % — ABNORMAL LOW (ref 36.0–46.0)
Hemoglobin: 11 g/dL — ABNORMAL LOW (ref 12.0–15.0)
MCH: 25.6 pg — ABNORMAL LOW (ref 26.0–34.0)
MCHC: 31.8 g/dL (ref 30.0–36.0)
MCV: 80.7 fL (ref 80.0–100.0)
Platelets: 141 10*3/uL — ABNORMAL LOW (ref 150–400)
RBC: 4.29 MIL/uL (ref 3.87–5.11)
RDW: 21.4 % — ABNORMAL HIGH (ref 11.5–15.5)
WBC: 13.4 10*3/uL — ABNORMAL HIGH (ref 4.0–10.5)
nRBC: 0.2 % (ref 0.0–0.2)

## 2022-06-02 LAB — RENAL FUNCTION PANEL
Albumin: 2.1 g/dL — ABNORMAL LOW (ref 3.5–5.0)
Anion gap: 10 (ref 5–15)
BUN: 79 mg/dL — ABNORMAL HIGH (ref 8–23)
CO2: 23 mmol/L (ref 22–32)
Calcium: 7.1 mg/dL — ABNORMAL LOW (ref 8.9–10.3)
Chloride: 99 mmol/L (ref 98–111)
Creatinine, Ser: 2.13 mg/dL — ABNORMAL HIGH (ref 0.44–1.00)
GFR, Estimated: 24 mL/min — ABNORMAL LOW (ref >60)
Glucose, Bld: 134 mg/dL — ABNORMAL HIGH (ref 70–99)
Phosphorus: 4.7 mg/dL — ABNORMAL HIGH (ref 2.5–4.6)
Potassium: 3.4 mmol/L — ABNORMAL LOW (ref 3.5–5.1)
Sodium: 132 mmol/L — ABNORMAL LOW (ref 135–145)

## 2022-06-02 LAB — TYPE AND SCREEN
ABO/RH(D): O POS
Antibody Screen: NEGATIVE
Unit division: 0
Unit division: 0

## 2022-06-02 LAB — AEROBIC/ANAEROBIC CULTURE W GRAM STAIN (SURGICAL/DEEP WOUND)

## 2022-06-02 LAB — BPAM RBC
Blood Product Expiration Date: 202403052359
Blood Product Expiration Date: 202403062359
ISSUE DATE / TIME: 202401301022
ISSUE DATE / TIME: 202401301351
Unit Type and Rh: 5100
Unit Type and Rh: 5100

## 2022-06-02 LAB — GLUCOSE, CAPILLARY
Glucose-Capillary: 118 mg/dL — ABNORMAL HIGH (ref 70–99)
Glucose-Capillary: 127 mg/dL — ABNORMAL HIGH (ref 70–99)
Glucose-Capillary: 156 mg/dL — ABNORMAL HIGH (ref 70–99)
Glucose-Capillary: 187 mg/dL — ABNORMAL HIGH (ref 70–99)

## 2022-06-02 LAB — HEMOGLOBIN AND HEMATOCRIT, BLOOD
HCT: 38.3 % (ref 36.0–46.0)
Hemoglobin: 12.5 g/dL (ref 12.0–15.0)

## 2022-06-02 LAB — BASIC METABOLIC PANEL
Anion gap: 13 (ref 5–15)
BUN: 78 mg/dL — ABNORMAL HIGH (ref 8–23)
CO2: 20 mmol/L — ABNORMAL LOW (ref 22–32)
Calcium: 7 mg/dL — ABNORMAL LOW (ref 8.9–10.3)
Chloride: 97 mmol/L — ABNORMAL LOW (ref 98–111)
Creatinine, Ser: 2.2 mg/dL — ABNORMAL HIGH (ref 0.44–1.00)
GFR, Estimated: 23 mL/min — ABNORMAL LOW (ref >60)
Glucose, Bld: 133 mg/dL — ABNORMAL HIGH (ref 70–99)
Potassium: 3.4 mmol/L — ABNORMAL LOW (ref 3.5–5.1)
Sodium: 130 mmol/L — ABNORMAL LOW (ref 135–145)

## 2022-06-02 LAB — MAGNESIUM: Magnesium: 1.8 mg/dL (ref 1.7–2.4)

## 2022-06-02 MED ORDER — TRACE MINERALS CU-MN-SE-ZN 300-55-60-3000 MCG/ML IV SOLN
INTRAVENOUS | Status: AC
  Filled 2022-06-02: qty 643.2

## 2022-06-02 MED ORDER — METHYLPREDNISOLONE SODIUM SUCC 40 MG IJ SOLR
30.0000 mg | Freq: Every day | INTRAMUSCULAR | Status: DC
  Administered 2022-06-03 – 2022-06-05 (×3): 30 mg via INTRAVENOUS
  Filled 2022-06-02 (×3): qty 1

## 2022-06-02 NOTE — Progress Notes (Signed)
Patient ID: Sheri Cooper, female   DOB: [birth year], 72 y.o.   MRN: [id number] [facility] Progress Note   Assessment/ Plan:   1. 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  She has been on hemodialysis since 05/18/2022 for severe azotemia with development of uremic symptoms.  Started on treatment with corticosteroids.  Note repeat CT angio ordered  - acceptable for CT angio if clinically indicated urgently - we are monitoring for renal recovery but she has known GI blood losses  - currently on Solu-Medrol 40 mg daily.  Will lower this dose to solumedrol 30 mg daily given her sepsis and GI bleed.  Note plans to transition this to oral prednisone 60 mg daily thereafter (taper as outpatient under the supervision of Dr. Theador Hawthorne while being monitored on hemodialysis).   - HD today per MWF schedule as needed - Note that she has been accepted to an outpatient hemodialysis unit-[facility] and can start there as early as 06/01/2022 however, does not seem medically ready for discharge yet.    2. CKD stage 3b - Note that she follows with Dr. Theador Hawthorne as an outpatient   3.  Healthcare associated pneumonia: antibiotics per primary team   4.  Anemia acute blood loss: Likely secondary to chronic illness and recent GI bleed for which she had emergency exploratory laparotomy with omental patch for perforated DU.  Recommendations per gastroenterology for empiric H. pylori treatment noted. Transfuse per primary team. TSAT was 40% 05/16/22. On aranesp 200 mcg every Tuesday.  Note CT angio ordered for GI bleed  5.  Sepsis: Secondary to urinary tract infection and peritonitis from ruptured duodenal ulcer requiring emergent exploratory laparotomy.  Abx per primary team.  Transiently in ICU for pressors/intubated and now weaned off of pressors  6. HTN  -  optimize volume status today with HD   7. Hypokalemia - for 3K bath   Disposition - per primary team    Subjective:   Last HD on 1/29 with 1.5 kg UF.  Note nursing charting with black, tarry stools overnight.  She had 500 mL uop over 1/30.  She had had 1.1 liters emesis/NG output.  I missed her today as she is off the floor for CTA then was taken for upper GI.  (This note is for documentation only, not billing)     Objective:   BP (!) 200/58 (BP Location: Right Arm)   Pulse 94   Temp 98.9 F (37.2 C)   Resp 19   Ht '4\' 11"'$  (1.499 m)   Wt 61.6 kg   SpO2 95%   BMI 27.43 kg/m   Intake/Output Summary (Last 24 hours) at 06/02/2022 0940 Last data filed at 06/02/2022 2671 Gross per 24 hour  Intake 3946.11 ml  Output 1605 ml  Net 2341.11 ml   Weight change: -3.5 kg  Physical Exam:   not examined   Imaging: No results found.  Labs: BMET Recent Labs  Lab 05/28/22 0714 05/28/22 0803 05/29/22 0508 05/30/22 0500 05/31/22 0515 06/01/22 0404 06/02/22 0407  NA 135 135 135 135 132* 132* 132*  130*  K 3.4* 3.5 4.5 4.6 5.4* 3.7 3.4*  3.4*  CL 101 101 100 99 100 98 99  97*  CO2 '24 23 24 25 22 26 23  '$ 20*  GLUCOSE 98 97 141* 146* 160* 194* 134*  133*  BUN  22 21 42* 60* 87* 53* 79*  78*  CREATININE 1.76* 1.71* 2.55* 1.88* 2.34* 1.70* 2.13*  2.20*  CALCIUM 6.6* 6.6* 7.3* 6.7* 7.3*  7.0* 6.8* 7.1*  7.0*  PHOS 3.1 3.1 3.2 2.3* 3.8  3.7 3.9 4.7*   CBC Recent Labs  Lab 05/28/22 0714 05/28/22 1208 05/30/22 0751 05/30/22 1117 05/31/22 1133 06/01/22 0404 06/01/22 0700 06/02/22 0407  WBC 18.6*  --  17.8*  --   --  14.1*  --  13.4*  HGB 10.5*   < > 7.7*   < > 7.1* 6.4* 5.5* 11.0*  HCT 31.7*   < > 24.5*   < > 22.5* 20.6* 17.4* 34.6*  MCV 81.1  --  75.9*  --   --  76.6*  --  80.7  PLT 147*  --  132*  --   --  126*  --  141*   < > = values in this interval not displayed.    Medications:     sodium chloride   Intravenous Once   Chlorhexidine Gluconate Cloth  6  each Topical Daily   Chlorhexidine Gluconate Cloth  6 each Topical Q0600   darbepoetin (ARANESP) injection - DIALYSIS  200 mcg Subcutaneous Q Tue-1800   hydrALAZINE  5 mg Intravenous Q8H   insulin aspart  0-9 Units Subcutaneous Q8H   methylPREDNISolone (SOLU-MEDROL) injection  40 mg Intravenous Daily   pantoprazole  40 mg Intravenous Q12H    Claudia Desanctis, MD  06/02/2022, 12:28 PM

## 2022-06-02 NOTE — Progress Notes (Signed)
Patient able to tolerate clear liquids , she ate all of her blue jello and felt she wanted to take it slow, oral care provided for patient, continues to have pain controlled by PRN dilaudid, call bell and hydration within reach.

## 2022-06-02 NOTE — Progress Notes (Signed)
PT Cancellation Note  Patient Details Name: DAUNA ZISKA MRN: [id number] DOB: 08-12-1950   Cancelled Treatment:    Reason Eval/Treat Not Completed: Patient at procedure or test/unavailable.  Patient receiving dialysis, will check back tomorrow.   3:10 PM, 06/02/22 Lonell Grandchild, MPT Physical Therapist with [facility] 336 [phone] office [phone] mobile phone

## 2022-06-02 NOTE — TOC Progression Note (Signed)
Transition of Care [facility]) - Progression Note    Patient Details  Name: Sheri Cooper MRN: [id number] Date of Birth: Sep 08, 1950  [facility] CM/SW Contact  Salome Arnt, [place] Phone Number: 06/02/2022, 2:36 PM  Clinical Narrative:  Pt received bed offer at [facility] and [facility]. LCSW attempted to discuss with pt several times, but she refused stating she was having a bad day and to try again tomorrow. Will ask CMA to start authorization.      Expected Discharge Plan: [place] Barriers to Discharge: Continued Medical Work up  Expected Discharge Plan and Services In-house Referral: Clinical Social Work   Post Acute Care Choice: [place] Living arrangements for the past 2 months: Apartment                                       Social Determinants of Health (SDOH) Interventions SDOH Screenings   Food Insecurity: No Food Insecurity (05/19/2022)  Housing: Low Risk  (05/19/2022)  Transportation Needs: No Transportation Needs (05/19/2022)  Utilities: Not At Risk (05/19/2022)  Tobacco Use: Medium Risk (06/01/2022)    Readmission Risk Interventions    05/18/2022   11:51 AM  Readmission Risk Prevention Plan  Transportation Screening Complete  PCP or Specialist Appt within 5-7 Days Not Complete  Home Care Screening Complete  Medication Review (RN CM) Complete

## 2022-06-02 NOTE — Progress Notes (Signed)
PHARMACY - TOTAL PARENTERAL NUTRITION CONSULT NOTE   Indication:  perforate ulcer, unable to meet nutritional needs orally/enterally  Patient Measurements: Height: '4\' 11"'$  (149.9 cm) Weight: 61.6 kg (135 lb 12.9 oz) IBW/kg (Calculated) : 43.2 TPN AdjBW (KG): 46.7 Body mass index is 27.43 kg/m. Usual Weight: 57kg  Assessment: 72 year old female originally admitted with pneumonia and AKI on 1/14. Admission complicated with anemia requiring colonoscopy and EGG. Pain returned 1/25, intraperitoneal gas and fluid on CT 1/25 concerning for perforation. Patient went for emergent exlap overnight and omental patch repair was done. Patient will remain npo and likely need TPN for a week per surgery. UGI study planned for 1/31.    Glucose / Insulin: CBGs 134-186, 7 units of insulin/24 hours. Patient continues on solu-medrol- reducing to 30 mg today   Electrolytes: CoCa = 8.6, Po4 3.9, Mg 2.6 potassium 3.7  Na 132 Renal: on IHD TTS prior to admit - off schedule currently Hepatic: TG 178 Intake / Output; MIVF: inaccurate, no MIVF 600 ml from IV Zyvox and 100 mL from IV fluconazole every 24 hours.- will monitor  GI Imaging: ABD [place] 1/23 CT 1/25 GI Surgeries / Procedures:  Colonoscopy 1/20 EGD 1/20 Emergent Ex-lap with omental patch 1/26  Central access: CVC placed 1/26 TPN start date: 1/26  Nutritional Goals: Goal TPN rate is 80 mL/hr (provides 92 g of protein and 1812 kcals per day)  RD Assessment: Estimated Needs Total Energy Estimated Needs: 1800-1900 Total Protein Estimated Needs: 90-98 gr Total Fluid Estimated Needs: 1000 ml plus urine output  Current Nutrition:  NPO  Plan:  Concentrate TPN since patient is ESRD on HD Change TPN to 60 ml/hr using clinasol 15% Electrolytes in TPN: Na 65mq/L, K 40 mEq/L, Ca 564m/L, Mg 4 mEq/L, and Phos 0 mmol/L. Cl:Ac 1:2 Add standard MVI, folic acid, and trace elements to TPN, chromium removed due to renal disease Add 10 units insulin  Continue  Sensitive q8h SSI and adjust as needed  Monitor TPN labs on Mon/Thurs  StMargot AblesPharmD Clinical Pharmacist 06/02/2022 8:33 AM

## 2022-06-02 NOTE — Progress Notes (Signed)
PROGRESS NOTE  Sheri Cooper UYQ:[id number] DOB: November 29, 1950 DOA: 05/16/2022 PCP: [facility]  Brief History:  72 y.o. female with medical history significant of COPD, rheumatoid arthritis, stage V CKD. Admitted on 05/16/2022 with healthcare associated pneumonia and worsening renal function with anion gap metabolic acidosis.  -Patient was recently discharged from [facility] on 05/12/2022 after treatment for COPD.  Nephrology was consulted.  Her hospitalization was prolonged secondary to HCAP and progressive renal failure.  She finished intravenous cefepime on 05/20/2022.  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  She was started on steroids by nephrology.  Pt was started on hemodialysis on 05/18/22 after temporary HD cath placement and had renal biopsy done on 05/19/22.  On 05/24/2022, tunneled dialysis catheter was placed.  An outpatient dialysis center was ultimately found for the patient.  Her first outpatient dialysis will be scheduled on 06/01/2022. Her hospitalization was prolonged secondary to GI bleed.  She was noted to have melena and hematochezia.  She was transfused 3 units total for the hospitalization.  GI was consulted.  On 05/22/2022, the patient underwent EGD and colonoscopy.  Results are discussed below.  Her hemoglobin stabilized.   On the evening of 05/27/22, pt had another large maroon stool.  Pt remains hemodynamically stable.  Case was discussed with GI, Dr. Jenetta Downer.  Case also discussed with nephrology.  It was felt that the benefit was greater than the risk of obtaining CTA of the abdomen and pelvis.   1/25 CTA showed intraperitoneal gas and fluid concerning for visceral perforation.  Based upon the location it was felt that it was likely coming from the first-second portion of the duodenum.  There is no acute bleeding.  General surgery was consulted.  Dr. Constance Haw took the patient to the OR for exploratory laparotomy.  Omental patch  repair was done.  The patient was left intubated overnight. She was extubated on 05/28/22.  She was initially on vasopressors which have been weaned off.  She remained NPO postop, and she was started on TPN. GI was reconsulted but did not feel she had any further active bleeding. Hgb continued to trend down over next 3 days.  After discussion with renal and GI, repeat CT angio abd/pelvis on 1/30.  Assessment/Plan: AKI on CKD 3b -ANCA, ANA, anti-GBM neg, C3/C4 WNL. HIV, Hep B neg. FLC within acceptable limits. SPEP pending  -Anion gap metabolic acidosis noted and sodium bicarbonate -patient sees Dr. Theador Hawthorne as outpatient---discussed with Dr. Lars Masson was concerned that patient would need hemodialysis this admission which has now been started -[place] records from [place] nephrology reviewed -appreciate [facility] follow up -first HD 05/18/22 -Right IJ HD catheter placed by Dr. 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   -continue steroids for now>> convert IV Solu-Medrol until able to take p.o. -Well-tolerated dialysis on 06/02/2022 -Continue to follow recommendations by nephrology service.   Sepsis -secondary to UTI and peritonitis -on vasopressors for brief time>>weaned off -zosyn started for peritonitis>>d/c after cultures returnd -zyvox for VRE in urine and peritoneum   Pneumoperitoneum/Peritonitis -Noted on CTA abdomen 05/27/2022 -Location likely in the first-second portion of the duodenum -05/27/22 Exploratory laparotomy with omental patch -Continue TPN -E faecium in peritoneal culture>> continue treatment with Zyvox and fluconazole. -  UTI appears to be stable; okay to advance diet to clear liquids and discontinue NG tube.   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   Acute respiratory failure with hypoxia -Patient left intubated postoperatively; successfully extubated on 05/28/2022 -Demonstrating good saturation on room air and no using accessory muscle -Continue supportive care.   Guaiac Positive Stools / GI bleeding/ ABLA Bleeding duodenal ulcers s/p clip x1, epinephrine and coagulation treatments -Continue PPI.  Esophagitis and Gastritis  -large bloody BM noted by RN guaiac positive on 1/18 -DC'd subcutaneous heparin on 1/18 -transfused 1 unit PRBC with HD on 1/18, Hg down again to 6.1, transfused 2 unit PRBC 1/19 with HD  -GI team consulted to eval 1/19 -transfused 2 units PRBC with HD 1/19, Hg up to 9.8 -EGD / Colonoscopy planned for 05/22/22 with Dr. Jenetta Downer - with findings of grade C esophagitis and gastritis with ulcers, multiple duodenal ulcers with  adherent blood clots, bleeding vessel clip x 1, epinephrine injection given and APC; however second vessel could not be clipped. Currently on PP  -initially finished 72 hours IV pantoprazole drip>>IV bid -05/22/21 colonoscopy-blood in entire colon  -05/27/22--had another large maroon stool -05/27/2022 CT abdomen--no active GI bleed, but pneumoperitoneum found -GI reconsulted -Transfused a total of 9 units PRBC throughout hospitalization -Hemoglobin currently stable; no signs of ongoing bleeding appreciated at this time. -UGI performed following general surgery recommendations demonstrated no abnormalities and is stable for NG tube removal. -Planning to advance diet to clear liquids. -Continue PPI.   COPD  -- stable  -continue bronchodilators -Pt is on steroids per nephrology for AKI -No wheezing appreciated on exam.   Hypomagnesemia/hypokalemia>>Hyperkalemia -electrolytes overall improved -now on HD. -Continue to follow electrolytes trend and further replete adjust as needed with dialysis.   Thrombocytopenia  - stopped   heparin on 1/18 - checking HIT antibody labs - NEG - likely due to acute medical illness. -Continue to follow platelet count.   Incidental finding of multiple renal lesions CT  of chest showed multiple renal lesions bilaterally, incompletely characterized on today's noncontrast CT examination, including an indeterminate 1.9 cm lesion in the anterior aspect of the interpolar region of the left kidney.  Follow-up nonemergent outpatient abdominal MRI with and without IV gadolinium is recommended in the near future to definitively evaluate this lesion and exclude neoplasm. -Outpatient workup.   Elevated BNP--echo from 05/10/2022 at [facility] available in [place] reveals-  The left ventricle is normal in size with normal wall thickness. The left ventricular systolic function is normal, LVEF is visually estimated at 60-65%. There is normal left ventricular diastolic function.  -There is mild to moderate pulmonary hypertension no significant valvular abnormalities. -hemodialysis to manage volume status -Heart healthy/low-sodium discussed with patient.   Diarrhea - resolved now. - c diff testing was negative  -Continue supportive care.       Family Communication:   brother updated   Consultants:  GI, renal, general surgery   Code Status:  FULL    DVT Prophylaxis:  SCDs     Procedures: As Listed in Progress Note Above   Antibiotics: Zosyn 1/26>>1/28 Fluconzole 1/26>> Zyvox 1/28>>    Subjective: Nursing staff reporting some maroon stools overnight; patient reports no chest pain, no nausea, no vomiting, no fever.   Objective: Vitals:   06/02/22 1430 06/02/22 1500 06/02/22 1530 06/02/22 1600  BP: (!) 188/67 (!) 168/85 (!) 164/82 (!) 170/81  Pulse:      Resp: 12 10  12 14  Temp:    97.9 F (36.6 C)  TempSrc:    Oral  SpO2:      Weight:      Height:        Intake/Output Summary (Last 24 hours) at 06/02/2022 1647 Last data filed at 06/02/2022 1600 Gross per 24 hour   Intake 1055.08 ml  Output 1311.2 ml  Net -256.12 ml   Weight change: -3.5 kg Exam: General exam: Alert, awake, oriented x 3; NG tube in place; no nausea, no vomiting.  Patient is afebrile and reporting no abdominal pain.  Per nursing staff some maroon stools overnight but no frank bleeding appreciated.  Patient expressed to be hungry and would like NG tube removed if possible. Respiratory system: Clear to auscultation. Respiratory effort normal.  Good saturation on room air.  No using accessory muscle. Cardiovascular system:RRR. No rubs or gallops; no JVD. Gastrointestinal system: Abdomen is slightly distended on examination; reports mild discomfort with deep palpation JP drain right upper quadrant with some serosanguineous fluid appreciated.  Decreased but present bowel sounds. Central nervous system: Alert and oriented. No focal neurological deficits. Extremities: No cyanosis or clubbing. Skin: No petechiae. Psychiatry: Judgement and insight appear normal. Mood & affect appropriate.   Data Reviewed: I have personally reviewed following labs and imaging studies  Basic Metabolic Panel: Recent Labs  Lab 05/29/22 0508 05/30/22 0500 05/31/22 0515 06/01/22 0404 06/01/22 0850 06/02/22 0407  NA 135 135 132* 132*  --  132*  130*  K 4.5 4.6 5.4* 3.7  --  3.4*  3.4*  CL 100 99 100 98  --  99  97*  CO2 '24 25 22 26  '$ --  23  20*  GLUCOSE 141* 146* 160* 194*  --  134*  133*  BUN 42* 60* 87* 53*  --  79*  78*  CREATININE 2.55* 1.88* 2.34* 1.70*  --  2.13*  2.20*  CALCIUM 7.3* 6.7* 7.3*  7.0* 6.8*  --  7.1*  7.0*  MG 2.0 1.7 2.6*  --  1.8 1.8  PHOS 3.2 2.3* 3.8  3.7 3.9  --  4.7*   Liver Function Tests: Recent Labs  Lab 05/28/22 0803 05/29/22 0508 05/30/22 0500 05/31/22 0515 06/01/22 0404 06/02/22 0407  AST 24  --  17 32  --   --   ALT 6  --  5 13  --   --   ALKPHOS 70  --  61 77  --   --   BILITOT 0.6  --  0.3  --   --   --   PROT 4.7*  --  4.0* 4.3*  --   --    ALBUMIN 2.1* 2.0* 1.7* 1.9* 1.8* 2.1*   CBC: Recent Labs  Lab 05/27/22 0420 05/27/22 1831 05/28/22 0714 05/28/22 1208 05/30/22 0751 05/30/22 1117 05/31/22 1133 06/01/22 0404 06/01/22 0700 06/02/22 0407 06/02/22 1110  WBC 24.3*  --  18.6*  --  17.8*  --   --  14.1*  --  13.4*  --   HGB 8.0*   < > 10.5*   < > 7.7*   < > 7.1* 6.4* 5.5* 11.0* 12.5  HCT 24.6*   < > 31.7*   < > 24.5*   < > 22.5* 20.6* 17.4* 34.6* 38.3  MCV 86.6  --  81.1  --  75.9*  --   --  76.6*  --  80.7  --   PLT 203  --  147*  --  132*  --   --  126*  --  141*  --    < > = values in this interval not displayed.   CBG: Recent Labs  Lab 06/01/22 1606 06/01/22 2357 06/02/22 0745 06/02/22 1117 06/02/22 1628  GLUCAP 229* 186* 156* 118* 127*   Urine analysis:    Component Value Date/Time   COLORURINE YELLOW 05/28/2022 1005   APPEARANCEUR CLOUDY (A) 05/28/2022 1005   LABSPEC 1.018 05/28/2022 1005   PHURINE 7.0 05/28/2022 1005   GLUCOSEU NEGATIVE 05/28/2022 1005   HGBUR SMALL (A) 05/28/2022 1005   BILIRUBINUR NEGATIVE 05/28/2022 1005   KETONESUR NEGATIVE 05/28/2022 1005   PROTEINUR >=300 (A) 05/28/2022 1005   NITRITE NEGATIVE 05/28/2022 1005   LEUKOCYTESUR MODERATE (A) 05/28/2022 1005   Sepsis Labs: Recent Results (from the past 240 hour(s))  Aerobic/Anaerobic Culture w Gram Stain (surgical/deep wound)     Status: None   Collection Time: 05/27/22 11:45 PM   Specimen: PATH Cytology Peritoneal fluid; Body Fluid  Result Value Ref Range Status   Specimen Description   Final    PERITONEAL FLUID Performed at [facility], [address], [place], [place] [address]    Special Requests   Final    NONE Performed at [facility], [address], [place], [place] [address]    Gram Stain   Final    RARE WBC PRESENT, PREDOMINANTLY PMN NO ORGANISMS SEEN    Culture   Final    FEW ENTEROCOCCUS FAECIUM VANCOMYCIN RESISTANT ENTEROCOCCUS ISOLATED NO ANAEROBES ISOLATED Performed at [facility],  [place] [address] [place], [place] [address]    Report Status 06/02/2022 FINAL  Final   Organism ID, Bacteria ENTEROCOCCUS FAECIUM  Final      Susceptibility   Enterococcus faecium - MIC*    AMPICILLIN >=32 RESISTANT Resistant     VANCOMYCIN >=32 RESISTANT Resistant     GENTAMICIN SYNERGY SENSITIVE Sensitive     LINEZOLID 2 SENSITIVE Sensitive     * FEW ENTEROCOCCUS FAECIUM  Urine Culture (for pregnant, neutropenic or urologic patients or patients with an indwelling urinary catheter)     Status: Abnormal   Collection Time: 05/28/22 10:05 AM   Specimen: Urine, Catheterized  Result Value Ref Range Status   Specimen Description   Final    URINE, CATHETERIZED Performed at [facility], [address] [place], [place] [address]    Special Requests   Final    NONE Performed at [facility], [address], [place], [place] [address]    Culture (A)  Final    >=100,000 COLONIES/mL ENTEROCOCCUS FAECIUM VANCOMYCIN RESISTANT ENTEROCOCCUS ISOLATED    Report Status 05/30/2022 FINAL  Final   Organism ID, Bacteria ENTEROCOCCUS FAECIUM (A)  Final      Susceptibility   Enterococcus faecium - MIC*    AMPICILLIN >=32 RESISTANT Resistant     NITROFURANTOIN 64 INTERMEDIATE Intermediate     VANCOMYCIN >=32 RESISTANT Resistant     LINEZOLID 2 SENSITIVE Sensitive     * >=100,000 COLONIES/mL ENTEROCOCCUS FAECIUM     Scheduled Meds:  sodium chloride   Intravenous Once   Chlorhexidine Gluconate Cloth  6 each Topical Daily   Chlorhexidine Gluconate Cloth  6 each Topical Q0600   darbepoetin (ARANESP) injection - DIALYSIS  200 mcg Subcutaneous Q Tue-1800   hydrALAZINE  5 mg Intravenous Q8H   insulin aspart  0-9 Units Subcutaneous Q8H   [START ON 06/03/2022] methylPREDNISolone (SOLU-MEDROL) injection  30 mg Intravenous Daily   pantoprazole  40 mg Intravenous Q12H   Continuous Infusions:  albumin human     fluconazole (DIFLUCAN) IV Stopped (06/02/22 0428)   linezolid (ZYVOX) IV 600 mg (06/02/22 1212)   TPN  ADULT (ION) 80 mL/hr at 06/01/22 1754   TPN ADULT (ION)      Procedures/Studies: DG UGI W SMALL BOWEL  Result Date: 06/02/2022 CLINICAL DATA:  Post repair of perforated ulcer EXAM: WATER SOLUBLE UPPER GI SERIES TECHNIQUE: Single-column upper GI series was performed using water soluble contrast. Radiation Exposure Index (as provided by the fluoroscopic device): 23.1 mGy Kerma CONTRAST:  100 cc Omnipaque 300 IV COMPARISON:  None available FLUOROSCOPY: Fluoroscopy Time:  1 minute 24 seconds Radiation Exposure Index (if provided by the fluoroscopic device): 23.1 mGy Number of Acquired Spot Images: Multiple spots FINDINGS: Normal gastric distension. Wall thickening at the rugal folds from mid stomach through antrum. Additional wall thickening of the pylorus and duodenal bulb. No contrast extravasation identified to suggest postoperative leak. Contrast opacifies the duodenal sweep with normal position of ligament of Treitz. No extraluminal contrast identified nor evidence of contrast opacification of the indwelling JP drain. IMPRESSION: Wall thickening of mid stomach throughout duodenal bulb consistent with edema related to prior ulcer and subsequent surgery. No evidence of postoperative leak. Electronically Signed   By: Lavonia Dana M.D.   On: 06/02/2022 11:42   DG Chest Port 1 View  Result Date: 05/28/2022 CLINICAL DATA:  Respiratory failure EXAM: PORTABLE CHEST 1 VIEW COMPARISON:  05/25/2022 FINDINGS: Endotracheal tube is seen 3.5 cm above the carina. Nasogastric tube extends into the upper abdomen beyond the margin of the examination. Left internal jugular central venous catheter tip within the superior vena cava. Right internal jugular hemodialysis catheter tip noted within the right atrium. The lungs are symmetrically well expanded. Partial left lower lobe collapse noted, however, with retrocardiac consolidation. Mild relative opacification the right hemithorax suggest the presence of a posteriorly  layering pleural effusion on this supine examination. Superimposed trace perihilar interstitial pulmonary infiltrate is in keeping with trace probable interstitial pulmonary edema. No pneumothorax. Cardiac size is within normal limits. IMPRESSION: 1. Support lines and tubes in appropriate position. 2. Partial left lower lobe collapse. 3. Suspected posteriorly layering right pleural effusion. 4. Trace probable interstitial pulmonary edema. Electronically Signed   By: Fidela Salisbury M.D.   On: 05/28/2022 03:32   CT ANGIO GI BLEED  Result Date: 05/27/2022 CLINICAL DATA:  Recurrent gastrointestinal hemorrhage EXAM: CTA ABDOMEN AND PELVIS WITHOUT AND WITH CONTRAST TECHNIQUE: Multidetector CT imaging of the abdomen and pelvis was performed using the standard protocol during bolus administration of intravenous contrast. Multiplanar reconstructed images and MIPs were obtained and reviewed to evaluate the vascular anatomy. RADIATION DOSE REDUCTION: This exam was performed according to the departmental dose-optimization program which includes automated exposure control, adjustment of the mA and/or kV according to patient size and/or use of iterative reconstruction technique. CONTRAST:  30m OMNIPAQUE IOHEXOL 350 MG/ML SOLN COMPARISON:  None Available. FINDINGS: VASCULAR Aorta: Moderate mixed atherosclerotic plaque. No aortic aneurysm or dissection. No periaortic inflammatory change identified. Celiac: Patent without evidence of aneurysm, dissection, vasculitis or significant stenosis. SMA: Patent without evidence of aneurysm, dissection, vasculitis or significant stenosis. Renals: High-grade (near occlusion) stenosis of the a left renal artery origin. Less than 50% stenosis of the right renal artery origin. Normal vascular morphology. Relatively diminutive caliber of the left renal arterial vasculature distal the stenosis may relate to under filling. No aneurysm or dissection. IMA: Occluded at its origin with  reconstitution via the marginal artery. 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 Focal irregular minimum 50% stenosis of the left common iliac artery secondary to a short segment dissection flap within the proximal left common iliac artery. Right internal iliac artery is heavily diseased and occluded at its origin with subsequent reconstitution. High-grade focal stenosis of the left internal iliac artery at its origin. Proximal Outflow: Bilateral common femoral and visualized portions of the superficial and profunda femoral arteries are patent without evidence of aneurysm, dissection, vasculitis or significant stenosis. Veins: Mesenteric, splenic, portal venous, and hepatic venous structures are patent. Iliofemoral veins are patent. Renal veins and inferior vena cava are patent. Review of the MIP images confirms the above findings. NON-VASCULAR Lower chest: Small left and moderate right pleural effusions are present with bibasilar compressive atelectasis. Hepatobiliary: Cholelithiasis without pericholecystic inflammatory change. Liver unremarkable. No intra or extrahepatic biliary ductal dilation. Pancreas: Unremarkable Spleen: Unremarkable Adrenals/Urinary Tract: The adrenal glands are unremarkable. Moderate left renal cortical atrophy. Right kidney is normal in size. 6 mm plaque-like nonobstructing calculus noted within the upper pole the right kidney. Multiple simple cortical cysts are seen within the right kidney. Hypodense 16 mm lesion is seen within the interpolar region of the right kidney demonstrates no significant enhancement is compatible with a Bosniak class 2 cyst. No follow-up imaging is recommended for these lesions. No hydronephrosis. The bladder is unremarkable. Stomach/Bowel: There is mild free intraperitoneal gas present. The exact site of visceral perforation is not clearly identified on this examination, however, there is extraluminal gas and debris  surrounding the first and proximal second portion of the duodenum raising the question of a perforation in this location. Endoscopic clip noted within the juncture of the first and second portion of the duodenum. Moderate free intraperitoneal fluid. Endoscopic clip laying dependently within the cecum. The stomach, small bowel, and large bowel are otherwise unremarkable. No active gastrointestinal hemorrhage identified. Fluid within the descending colon and rectal vault may present clinically as diarrhea. Lymphatic: No pathologic adenopathy within the abdomen and pelvis. Reproductive: Involuted fibroid within the uterus. The pelvic organs are otherwise unremarkable. Other: Moderate subcutaneous body wall edema noted dependently. Musculoskeletal: No acute bone abnormality. No lytic or blastic bone lesion. IMPRESSION: VASCULAR . 1. 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 2. 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 Focal irregular minimum 50% stenosis of the left common iliac artery secondary to a short segment dissection flap. 3. 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 High-grade focal stenosis of the left internal iliac artery at its origin. 4. 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 NON-VASCULAR . 1. Moderate free intraperitoneal gas and fluid in keeping with visceral perforation. The exact site is not clearly identified on this examination, however, more focal debris and gas adjacent to the juncture of the first and second portion of the duodenum raises the question of a perforation in this location. 2. No active gastrointestinal hemorrhage identified 3. Cholelithiasis. 4. Mild right nonobstructing nephrolithiasis. 5. Fluid within the descending colon and rectal vault may present clinically  as diarrhea. Aortic Atherosclerosis (ICD10-I70.0). Electronically Signed   By: Fidela Salisbury M.D.   On: 05/27/2022 21:51   DG CHEST PORT 1 VIEW  Result Date: 05/25/2022 CLINICAL DATA:  Chest pain EXAM: PORTABLE CHEST 1 VIEW COMPARISON:  05/22/2022 FINDINGS: Right internal jugular approach hemodialysis catheter terminates at the level of the right atrium. Stable heart size. Small right pleural effusion with associated right basilar opacity, similar to prior. Similar prominent interstitial markings bilaterally. No pneumothorax. IMPRESSION: 1. Small right pleural effusion with associated right basilar opacity, similar to prior. 2. Similar prominent interstitial markings bilaterally, likely mild edema. Electronically Signed   By: Davina Poke D.O.   On: 05/25/2022 11:39   DG Abd 1 View  Result Date: 05/25/2022 CLINICAL DATA:  Abdominal pain EXAM: ABDOMEN - 1 VIEW COMPARISON:  05/22/2022 FINDINGS: The bowel gas pattern is normal. No radio-opaque calculi or other significant radiographic abnormality are seen. Surgical clips overlie the pelvis and right upper quadrant. Calcification consistent with fibroid noted. IMPRESSION: Unremarkable bowel gas pattern. Calcified fibroid incidentally noted Electronically Signed   By: Sammie Bench M.D.   On: 05/25/2022 08:22   IR Fluoro Guide CV Line Right  Result Date: 05/24/2022 INDICATION: 72 year old female in need of durable dialysis access. She presents for removal temporary dialysis catheter in placement of a tunneled dialysis catheter. EXAM: TUNNELED CENTRAL VENOUS HEMODIALYSIS CATHETER PLACEMENT WITH ULTRASOUND AND FLUOROSCOPIC GUIDANCE MEDICATIONS: 2 g Ancef. The antibiotic was given in an appropriate time interval prior to skin puncture. ANESTHESIA/SEDATION: Moderate (conscious) sedation was employed during this procedure. A total of Versed 1 mg and Fentanyl 25 mcg was administered intravenously. Moderate Sedation Time: 11 minutes. The patient's level of  consciousness and vital signs were monitored continuously by radiology nursing throughout the procedure under my direct supervision. FLUOROSCOPY TIME:  Radiation exposure index: 1 mGy reference air kerma COMPLICATIONS: None immediate. PROCEDURE: Informed written consent was obtained from the patient after a discussion of the risks, benefits, and alternatives to treatment. Questions regarding the procedure were encouraged and answered. The right neck and chest were prepped with chlorhexidine in a sterile fashion, and a sterile drape was applied covering the operative field. Maximum barrier sterile technique with sterile gowns and gloves were used for the procedure. A timeout was performed prior to the initiation of the procedure. After creating a small venotomy incision, a micropuncture kit was utilized to access the right internal jugular vein under direct, real-time ultrasound guidance after the overlying soft tissues were anesthetized with 1% lidocaine with epinephrine. Ultrasound image documentation was performed. The microwire was kinked to measure appropriate catheter length. A stiff Glidewire was advanced to the level of the IVC and the micropuncture sheath was exchanged for a peel-away sheath. A palindrome tunneled hemodialysis catheter measuring 19 cm from tip to cuff was tunneled in a retrograde fashion from the anterior chest wall to the venotomy incision. The catheter was then placed through the peel-away sheath with tips ultimately positioned within the superior aspect of the right atrium. Final catheter positioning was confirmed and documented with a spot radiographic image. The catheter aspirates and flushes normally. The catheter was flushed with appropriate volume heparin dwells. The catheter exit site was secured with a 0-Prolene retention suture. The venotomy incision was closed with an interrupted 4-0 Vicryl, Dermabond and Steri-strips. Dressings were applied. The patient tolerated the procedure  well without immediate post procedural complication. IMPRESSION: Successful placement of 19 cm tip to cuff tunneled hemodialysis catheter via the right internal jugular vein with tips terminating within the superior aspect of the right atrium. The catheter is ready for immediate use. Electronically Signed   By: Jacqulynn Cadet M.D.   On: 05/24/2022 12:39   IR US Guide Vasc Access Right  Result Date:  05/24/2022 INDICATION: 72 year old female in need of durable dialysis access. She presents for removal temporary dialysis catheter in placement of a tunneled dialysis catheter. EXAM: TUNNELED CENTRAL VENOUS HEMODIALYSIS CATHETER PLACEMENT WITH ULTRASOUND AND FLUOROSCOPIC GUIDANCE MEDICATIONS: 2 g Ancef. The antibiotic was given in an appropriate time interval prior to skin puncture. ANESTHESIA/SEDATION: Moderate (conscious) sedation was employed during this procedure. A total of Versed 1 mg and Fentanyl 25 mcg was administered intravenously. Moderate Sedation Time: 11 minutes. The patient's level of consciousness and vital signs were monitored continuously by radiology nursing throughout the procedure under my direct supervision. FLUOROSCOPY TIME:  Radiation exposure index: 1 mGy reference air kerma COMPLICATIONS: None immediate. PROCEDURE: Informed written consent was obtained from the patient after a discussion of the risks, benefits, and alternatives to treatment. Questions regarding the procedure were encouraged and answered. The right neck and chest were prepped with chlorhexidine in a sterile fashion, and a sterile drape was applied covering the operative field. Maximum barrier sterile technique with sterile gowns and gloves were used for the procedure. A timeout was performed prior to the initiation of the procedure. After creating a small venotomy incision, a micropuncture kit was utilized to access the right internal jugular vein under direct, real-time ultrasound guidance after the overlying soft tissues  were anesthetized with 1% lidocaine with epinephrine. Ultrasound image documentation was performed. The microwire was kinked to measure appropriate catheter length. A stiff Glidewire was advanced to the level of the IVC and the micropuncture sheath was exchanged for a peel-away sheath. A palindrome tunneled hemodialysis catheter measuring 19 cm from tip to cuff was tunneled in a retrograde fashion from the anterior chest wall to the venotomy incision. The catheter was then placed through the peel-away sheath with tips ultimately positioned within the superior aspect of the right atrium. Final catheter positioning was confirmed and documented with a spot radiographic image. The catheter aspirates and flushes normally. The catheter was flushed with appropriate volume heparin dwells. The catheter exit site was secured with a 0-Prolene retention suture. The venotomy incision was closed with an interrupted 4-0 Vicryl, Dermabond and Steri-strips. Dressings were applied. The patient tolerated the procedure well without immediate post procedural complication. IMPRESSION: Successful placement of 19 cm tip to cuff tunneled hemodialysis catheter via the right internal jugular vein with tips terminating within the superior aspect of the right atrium. The catheter is ready for immediate use. Electronically Signed   By: Jacqulynn Cadet M.D.   On: 05/24/2022 12:39   DG CHEST PORT 1 VIEW  Result Date: 05/22/2022 CLINICAL DATA:  Evaluate for intra-abdominal free air. EXAM: PORTABLE CHEST 1 VIEW COMPARISON:  Abdominal radiograph 05/22/2022 FINDINGS: Central venous catheter tip projects over the superior vena cava. Stable cardiac and mediastinal contours. Small layering right pleural effusion with underlying consolidation. No pneumothorax. No definite free air no definite gas identified underlying the hemidiaphragms. IMPRESSION: 1. Small layering right pleural effusion with underlying consolidation. 2. No definite free air  identified underlying the hemidiaphragms. Consider further evaluation with decubitus imaging of the abdomen if there is persistent clinical concern given the limitations of this portable semi-erect chest radiograph. Electronically Signed   By: Lovey Newcomer M.D.   On: 05/22/2022 16:02   DG Abd 1 View  Result Date: 05/22/2022 CLINICAL DATA:  Postoperative abdominal pain. EXAM: ABDOMEN - 1 VIEW COMPARISON:  None Available. FINDINGS: Gas is demonstrated within nondilated loops of large and small bowel in a nonobstructed pattern. Supine evaluation limited for the detection of free intraperitoneal air.  Lumbar spine degenerative changes. Calcific density projecting over the pelvis, nonspecific, potentially calcified uterine fibroid. IMPRESSION: Nonobstructed bowel-gas pattern. Electronically Signed   By: Lovey Newcomer M.D.   On: 05/22/2022 14:08   DG CHEST PORT 1 VIEW  Result Date: 05/21/2022 CLINICAL DATA:  Leukocytosis EXAM: PORTABLE CHEST 1 VIEW COMPARISON:  05/18/2022 FINDINGS: Central venous line tip in distal SVC. Normal cardiac silhouette. Small RIGHT effusion. Mild venous congestion. No pneumothorax. No focal consolidation. Lymphadenectomy clips in the LEFT axilla. No significant interval change. IMPRESSION: 1. No significant interval change. 2. Small RIGHT effusion. 3. Mild venous congestion. Electronically Signed   By: Suzy Bouchard M.D.   On: 05/21/2022 08:22   US BIOPSY (KIDNEY)  Result Date: 05/19/2022 INDICATION: 72 year old female with history of end-stage renal disease. EXAM: ULTRASOUND GUIDED RENAL BIOPSY COMPARISON:  None Available. MEDICATIONS: None. ANESTHESIA/SEDATION: Fentanyl 25 mcg IV; Versed 0.5 mg IV Total Moderate Sedation time: 10 minutes; The patient was continuously monitored during the procedure by the interventional radiology nurse under my direct supervision. COMPLICATIONS: None immediate. PROCEDURE: Informed written consent was obtained from the patient after a discussion of  the risks, benefits and alternatives to treatment. The patient understands and consents the procedure. A timeout was performed prior to the initiation of the procedure. Ultrasound scanning was performed of the bilateral flanks. The inferior pole of the left kidney was selected for biopsy due to location and sonographic window. The procedure was planned. The operative site was prepped and draped in the usual sterile fashion. The overlying soft tissues were anesthetized with 1% lidocaine with epinephrine. A 17 gauge coaxial introducer needle was advanced into the inferior cortex of the left kidney and 2 core biopsies were obtained under direct ultrasound guidance with a 16 gauge core biopsy device. Images were saved for documentation purposes. The biopsy device was removed and hemostasis was obtained with manual compression after injection of Gel-Foam slurry along the needle track under ultrasound guidance. Post procedural scanning was negative for significant post procedural hemorrhage or additional complication. A dressing was placed. The patient tolerated the procedure well without immediate post procedural complication. IMPRESSION: Technically successful ultrasound guided left renal biopsy. Ruthann Cancer, MD [facility] Electronically Signed   By: Ruthann Cancer M.D.   On: 05/19/2022 14:19   DG Chest Port 1 View  Result Date: 05/18/2022 CLINICAL DATA:  Central line placement. EXAM: PORTABLE CHEST 1 VIEW COMPARISON:  CT chest and chest x-ray from 2 days ago. FINDINGS: New right internal jugular central venous catheter with tip at the cavoatrial junction. The heart size and mediastinal contours are within normal limits. Mild diffuse interstitial thickening similar. Decreasing small right and unchanged small left pleural effusions. Increasing asymmetric density in the peripheral left upper lobe. Similar atelectasis in the peripheral right middle lobe.  Improving right lower lobe atelectasis. No pneumothorax. No acute osseous abnormality. IMPRESSION: 1. New right internal jugular central venous catheter with tip at the cavoatrial junction. No pneumothorax. 2. Similar diffuse interstitial thickening with increasing asymmetric density in the peripheral left upper lobe. Differential considerations include infection or pulmonary edema. 3. Decreased small right and unchanged small left pleural effusions. Electronically Signed   By: Titus Dubin M.D.   On: 05/18/2022 14:06   US RENAL  Result Date: 05/18/2022 CLINICAL DATA:  Chronic kidney disease EXAM: RENAL / URINARY TRACT ULTRASOUND COMPLETE COMPARISON:  Abdomen CT report 01/13/2018 FINDINGS: Right Kidney: Renal measurements: 8.9 x 4.7 x 4.4 = volume: 97 mL. Kidney appears  echogenic. No collecting system dilatation. There is a simple cyst along the midportion of the kidney which is anechoic with through transmission measuring 17 mm. Left Kidney: Renal measurements: 7.5 x 3.9 x 3.2 = volume: 49 mL. Small echogenic appearing kidney. No collecting system dilatation. Anechoic rounded structure seen in the mid kidney measuring 2 cm. Through transmission is smoothly marginated. Bladder: Appears normal for degree of bladder distention. Other: Scattered ascites.  Probable right-sided pleural effusion. IMPRESSION: Echogenic small appearing kidneys. No collecting system dilatation. Bilateral simple appearing renal cysts. Ascites.  Right pleural effusion Electronically Signed   By: Jill Side M.D.   On: 05/18/2022 11:07   CT Chest Wo Contrast  Result Date: 05/16/2022 CLINICAL DATA:  72 year old female with history of suspected pneumonia. EXAM: CT CHEST WITHOUT CONTRAST TECHNIQUE: Multidetector CT imaging of the chest was performed following the standard protocol without IV contrast. RADIATION DOSE REDUCTION: This exam was performed according to the departmental dose-optimization program which includes automated  exposure control, adjustment of the mA and/or kV according to patient size and/or use of iterative reconstruction technique. COMPARISON:  Chest x-ray 05/16/2022. FINDINGS: Cardiovascular: Heart size is normal. There is no significant pericardial fluid, thickening or pericardial calcification. Aortic atherosclerosis. No definite coronary artery calcifications. Mild calcifications of the aortic valve. Mediastinum/Nodes: No pathologically enlarged mediastinal or hilar lymph nodes. Please note that accurate exclusion of hilar adenopathy is limited on noncontrast CT scans. Esophagus is unremarkable in appearance. No axillary lymphadenopathy. Lungs/Pleura: Moderate to large right and small to moderate left pleural effusions lying dependently associated with areas of passive subsegmental atelectasis in the lower lobes of the lungs bilaterally. In addition, there are mid to upper lung predominant patchy areas of ground-glass attenuation and nodular appearing consolidative airspace disease, likely to reflect multilobar bilateral bronchopneumonia. The largest of these nodular appearing regions in the apex of the left upper lobe (axial image 32 of series 4) measuring 1.3 x 1.2 cm. Upper Abdomen: Aortic atherosclerosis. Tiny partially calcified gallstones lying dependently in the fundus of the gallbladder. Small volume of ascites. Multiple renal lesions bilaterally, incompletely characterized on today's noncontrast CT examination, most notable for an exophytic intermediate attenuation lesion (25 HU) measuring 1.9 cm in the anterior aspect of the interpolar region of the left kidney. A cluster of nonobstructive calculi is also noted in the posterior aspect of the upper pole collecting system of the right kidney measuring up to 3 mm. Musculoskeletal: There are no aggressive appearing lytic or blastic lesions noted in the visualized portions of the skeleton. IMPRESSION: 1. The appearance the chest is most suggestive of multilobar  bilateral bronchopneumonia with bilateral parapneumonic pleural effusions, as above. 2. Given the nodular appearance of some of the areas of apparent airspace consolidation, follow-up noncontrast chest CT is recommended in 3 months to ensure resolution of the nodular areas following treatment for the patient's acute illness, to exclude underlying neoplasm. 3. Multiple renal lesions bilaterally, incompletely characterized on today's noncontrast CT examination, including an indeterminate 1.9 cm lesion in the anterior aspect of the interpolar region of the left kidney. Follow-up nonemergent outpatient abdominal MRI with and without IV gadolinium is recommended in the near future to definitively evaluate this lesion and exclude neoplasm. 4. Cholelithiasis. 5. Nonobstructive calculi in the right renal collecting system measuring up to 3 mm. 6. Ascites. 7. Aortic atherosclerosis. Aortic Atherosclerosis (ICD10-I70.0). Electronically Signed   By: Vinnie Langton M.D.   On: 05/16/2022 05:23   DG Chest Port 1 View  Result Date: 05/16/2022 CLINICAL  DATA:  Shortness of breath EXAM: PORTABLE CHEST 1 VIEW COMPARISON:  05/07/2022 FINDINGS: Cardiac shadow is enlarged but stable. Small pleural effusion on the right is again noted. Some increased basilar density is noted on the right when compared with the prior study. Postsurgical changes in the left breast are seen. No bony abnormality is noted. IMPRESSION: Persistent right-sided effusion with slight increase in basilar infiltrate on the right. Electronically Signed   By: Inez Catalina M.D.   On: 05/16/2022 03:45    Roque Lias  [facility]  If 7PM-7AM, please contact night-coverage [email or web address] Password [facility] 06/02/2022, 4:47 PM   LOS: 17 days

## 2022-06-02 NOTE — Progress Notes (Signed)
Pharmacy Antibiotic Note  Sheri Cooper is a 72 y.o. female admitted on 05/16/2022 with  duodenal perforation with purulent ascites .  Pharmacy has been consulted for Fluconazole dosing.    Plan: Continue Fluconazole 200 mg IV q24h Continue Zyvox '600mg'$  IV bid  Trend WBC, temp F/U infectious work-up  Height: '4\' 11"'$  (149.9 cm) Weight: 56.9 kg (125 lb 7.1 oz) IBW/kg (Calculated) : 43.2  Temp (24hrs), Avg:98.2 F (36.8 C), Min:97.6 F (36.4 C), Max:98.9 F (37.2 C)  Recent Labs  Lab 05/27/22 0420 05/28/22 0714 05/28/22 0803 05/29/22 0508 05/30/22 0500 05/30/22 0751 05/31/22 0515 06/01/22 0404 06/02/22 0407  WBC 24.3* 18.6*  --   --   --  17.8*  --  14.1* 13.4*  CREATININE 2.98* 1.76*   < > 2.55* 1.88*  --  2.34* 1.70* 2.13*  2.20*   < > = values in this interval not displayed.     Estimated Creatinine Clearance: 18.4 mL/min (A) (by C-G formula based on SCr of 2.13 mg/dL (H)).    No Known Allergies  Vanco 1/14 >> 1/15 Cefepime 1/14 >>1/18 Fcz 1/26> Zosyn 1/26> 1/28 Zyvox 1/28>   1/14 Bcx: ng 1/14 MRSA PCR: neg 1/26 urine: enterococcu faecium - S zyvox 1/25 Peritoneal: VRE  Margot Ables, PharmD Clinical Pharmacist 06/02/2022 5:09 PM

## 2022-06-02 NOTE — Progress Notes (Addendum)
Subjective: Patient states she had a rough night last night with abdominal pain, having multiple black stools over night and continued into this am. She notes continued SOB that she attributes to NG tube. No nausea this morning. She is passing flatus.   Objective: Vital signs in last 24 hours: Temp:  [98 F (36.7 C)-98.9 F (37.2 C)] 98.9 F (37.2 C) (01/31 0549) Pulse Rate:  [86-97] 94 (01/31 0549) Resp:  [18-20] 19 (01/31 0549) BP: (194-200)/(43-76) 200/58 (01/31 0549) SpO2:  [95 %-100 %] 95 % (01/31 0549) Weight:  [61.6 kg] 61.6 kg (01/31 0600) Last BM Date : 06/01/22 General:   Alert and oriented, pleasant Head:  Normocephalic and atraumatic. Eyes:  No icterus, sclera clear. Conjuctiva pink.  Mouth:  Without lesions, mucosa pink and moist.   Heart:  S1, S2 present, no murmurs noted.  Lungs: Clear to auscultation bilaterally, without wheezing, rales, or rhonchi.  Abdomen:  Bowel sounds present, soft, mildly distended, abdominal binder and JP drain in place. No HSM or hernias noted. No rebound or guarding. No masses appreciated  Msk:  Symmetrical without gross deformities. Normal posture. Pulses:  Normal pulses noted. Extremities:  Without clubbing or edema. Neurologic:  Alert and  oriented x4;  grossly normal neurologically. Skin:  Warm and dry, intact without significant lesions.  Psych:  Alert and cooperative. Normal mood and affect.  Intake/Output from previous day: 01/30 0701 - 01/31 0700 In: 3706.1 [I.V.:831.1; Blood:692; IV Piggyback:2183] Out: 2055 [Urine:500; Emesis/NG output:1050; Drains:255] Intake/Output this shift: Total I/O In: 240 [P.O.:240] Out: -   Lab Results: Recent Labs    06/01/22 0404 06/01/22 0700 06/02/22 0407  WBC 14.1*  --  13.4*  HGB 6.4* 5.5* 11.0*  HCT 20.6* 17.4* 34.6*  PLT 126*  --  141*   BMET Recent Labs    05/31/22 0515 06/01/22 0404 06/02/22 0407  NA 132* 132* 132*  130*  K 5.4* 3.7 3.4*  3.4*  CL 100 98 99  97*  CO2  '22 26 23  '$ 20*  GLUCOSE 160* 194* 134*  133*  BUN 87* 53* 79*  78*  CREATININE 2.34* 1.70* 2.13*  2.20*  CALCIUM 7.3*  7.0* 6.8* 7.1*  7.0*   LFT Recent Labs    05/31/22 0515 06/01/22 0404 06/02/22 0407  PROT 4.3*  --   --   ALBUMIN 1.9* 1.8* 2.1*  AST 32  --   --   ALT 13  --   --   ALKPHOS 77  --   --    Assessment: 72 year old lady with a history of COPD, RA, CKD Stage IV who was admitted with pneumonia and worsening renal function 05/16/22 after recent discharge from OSH on 05/12/22. GI consulted for further evaluation of worsening anemia and rectal bleeding.   Acute blood loss anemia/perforated duodenal ulcer:  In setting of PUD with 2 nonbleeding cratered gastric ulcers, multiple duodenal ulcers, 2 of which had adherent clots that were removed and revealed 2 vessels.  1 vessel was clipped, cauterized, and treated with epinephrine injection.  Second vessel was clipped and treated w/ cautery, clip fell off.  She later developed epigastric abdominal pain, found to have perforated duodenal ulcer, underwent emergent exploratory laparotomy with graham patch and JP drain placement on 1/26. has received 7 units PRBCs this admission and hemoglobin declined as low as 5.5 yesterday, up to 11 today/12.5 on repeat s/p 2 units yesterday afternoon, query incorrect value of 5.5 given extreme jump in hgb with only 2  units of blood. She reports some Melena and states this was ongoing overnight, though NG tube output lighter in color. JP drain with serous appearing fluid.  recommend repeat CT angiography if she has any overt GI bleeding.  Surgery saw patient in follow up yesterday, UGI series today was unremarkable for any leaks, recommended removing NG tube and clear liquid diet.    Etiology of PUD unclear, will continue PPI BID and plan for empiric H pylori treatment outpatient.  fasting gastrin level pending.    Plan: Trend h&h, transfuse hgb <7 Monitor for overt GI bleeding, proceed with CTA  if bleeding occurs  Continue protonix '40mg'$  BID x3 months, then daily thereafter Empiric H pylori treatment after d/c Consider surveillance EGD later this year  Follow for fasting gastrin level    Chelsea L. Alver Sorrow, MSN, APRN, AGNP-C Adult-Gerontology Nurse Practitioner [facility]    Attending note:  Patient seen and dialysis.  Events over the past 24 hours noted.  Currently she appears quite stable without overt GI bleeding. Agree with recommendations as outlined above.  Will need  twice daily PPI therapy x 3 months.  No NSAIDs.  Empiric treatment for H. Pylori..  EGD later in the year.

## 2022-06-02 NOTE — Progress Notes (Signed)
NG tube removed patient tolerated well.

## 2022-06-02 NOTE — Progress Notes (Signed)
   HEMODIALYSIS TREATMENT NOTE:   Uneventful 3.5 hour heparin-free treatment completed using RIJ TDC.  Goal met: 1.2 liters removed without interruption in UF.  Dilaudid was given once for pain. NG tube and TPN were discontinued by primary nurse during HD - pt was very happy about that.  SBPs 170s-190s.  No peripheral edema, lungs clear/diminished bases.  Abdomen swollen, JP bulb emptied twice. Primary nurse will give scheduled Hydralazine.  All blood was returned.    POST-DIALYSIS:   06/02/22 1600  Vitals  Temp 97.9 F (36.6 C)  Temp Source Oral  BP (!) 170/81  MAP (mmHg) 116  BP Location Left Arm  BP Method Automatic  Patient Position (if appropriate) Lying  Pulse Rate Source Monitor  ECG Heart Rate 95  Resp 14  Post Treatment  Dialyzer Clearance Lightly streaked  Duration of HD Treatment -hour(s) 3.5 hour(s)  Hemodialysis Intake (mL) 0 mL  Liters Processed 63  Fluid Removed (mL) 1.2 mL  Tolerated HD Treatment Yes  Post-Hemodialysis Comments Goal met  Hemodialysis Catheter Right Internal jugular Double lumen Permanent (Tunneled)  Placement Date/Time: 05/24/22 0953   Serial / Lot #: [id number]  Expiration Date: 02/03/27  Time Out: Correct patient;Correct site;Correct procedure  Maximum sterile barrier precautions: Hand hygiene;Cap;Mask;Sterile gown;Sterile gloves;Large sterile ...  Site Condition No complications  Blue Lumen Status Heparin locked;Dead end cap in place  Red Lumen Status Heparin locked;Dead end cap in place  Purple Lumen Status N/A  Catheter fill solution Heparin 1000 units/ml  Catheter fill volume (Arterial) 1.6 cc  Catheter fill volume (Venous) 1.6  Dressing Type Transparent;Tube stabilization device  Dressing Status Antimicrobial disc in place;Clean, Dry, Intact  Interventions New dressing  Drainage Description None  Dressing Change Due 06/09/22  Post treatment catheter status Capped and Clamped    Patient's care was handed off to Rise Patience,  LPN.   Rockwell Alexandria, RN AP K[facility]

## 2022-06-02 NOTE — Progress Notes (Signed)
Patient has had loose, black, tarry stools x4 this shift. NG tube output noted to be clear tea-Ovadia Lopp colored with a red tint, contents in the tubing are clear and red. Dr. Josephine Cables made aware of these observations. No new orders. Patient denies pain or discomfort at this time.

## 2022-06-02 NOTE — Progress Notes (Signed)
[facility]  Upper gi negative for leak. Clear diet, NG out. H&H is stable to rising at 12.5.   I wonder if the 5.5 was a false read from the CVL.  JP with serous output 255 removed, I think she is mobilizing some fluid from the drain.  Drain to be emptied once every 4 hours max.   Curlene Labrum, MD [facility] [address] [place], [place] [address] [phone] (office)

## 2022-06-03 DIAGNOSIS — D62 Acute posthemorrhagic anemia: Secondary | ICD-10-CM | POA: Diagnosis not present

## 2022-06-03 DIAGNOSIS — K659 Peritonitis, unspecified: Secondary | ICD-10-CM | POA: Diagnosis not present

## 2022-06-03 DIAGNOSIS — K922 Gastrointestinal hemorrhage, unspecified: Secondary | ICD-10-CM | POA: Diagnosis not present

## 2022-06-03 DIAGNOSIS — J189 Pneumonia, unspecified organism: Secondary | ICD-10-CM | POA: Diagnosis not present

## 2022-06-03 LAB — RENAL FUNCTION PANEL
Albumin: 2 g/dL — ABNORMAL LOW (ref 3.5–5.0)
Anion gap: 7 (ref 5–15)
BUN: 57 mg/dL — ABNORMAL HIGH (ref 8–23)
CO2: 26 mmol/L (ref 22–32)
Calcium: 6.9 mg/dL — ABNORMAL LOW (ref 8.9–10.3)
Chloride: 97 mmol/L — ABNORMAL LOW (ref 98–111)
Creatinine, Ser: 1.68 mg/dL — ABNORMAL HIGH (ref 0.44–1.00)
GFR, Estimated: 32 mL/min — ABNORMAL LOW (ref >60)
Glucose, Bld: 140 mg/dL — ABNORMAL HIGH (ref 70–99)
Phosphorus: 2.8 mg/dL (ref 2.5–4.6)
Potassium: 3.4 mmol/L — ABNORMAL LOW (ref 3.5–5.1)
Sodium: 130 mmol/L — ABNORMAL LOW (ref 135–145)

## 2022-06-03 LAB — COMPREHENSIVE METABOLIC PANEL
ALT: 70 U/L — ABNORMAL HIGH (ref 0–44)
AST: 61 U/L — ABNORMAL HIGH (ref 15–41)
Albumin: 1.9 g/dL — ABNORMAL LOW (ref 3.5–5.0)
Alkaline Phosphatase: 138 U/L — ABNORMAL HIGH (ref 38–126)
Anion gap: 9 (ref 5–15)
BUN: 56 mg/dL — ABNORMAL HIGH (ref 8–23)
CO2: 26 mmol/L (ref 22–32)
Calcium: 7.1 mg/dL — ABNORMAL LOW (ref 8.9–10.3)
Chloride: 97 mmol/L — ABNORMAL LOW (ref 98–111)
Creatinine, Ser: 1.72 mg/dL — ABNORMAL HIGH (ref 0.44–1.00)
GFR, Estimated: 31 mL/min — ABNORMAL LOW (ref >60)
Glucose, Bld: 140 mg/dL — ABNORMAL HIGH (ref 70–99)
Potassium: 3.5 mmol/L (ref 3.5–5.1)
Sodium: 132 mmol/L — ABNORMAL LOW (ref 135–145)
Total Bilirubin: 0.4 mg/dL (ref 0.3–1.2)
Total Protein: 4.2 g/dL — ABNORMAL LOW (ref 6.5–8.1)

## 2022-06-03 LAB — PHOSPHORUS: Phosphorus: 2.8 mg/dL (ref 2.5–4.6)

## 2022-06-03 LAB — MAGNESIUM: Magnesium: 1.8 mg/dL (ref 1.7–2.4)

## 2022-06-03 LAB — TRIGLYCERIDES: Triglycerides: 146 mg/dL (ref <150)

## 2022-06-03 LAB — GLUCOSE, CAPILLARY
Glucose-Capillary: 153 mg/dL — ABNORMAL HIGH (ref 70–99)
Glucose-Capillary: 151 mg/dL — ABNORMAL HIGH (ref 70–99)
Glucose-Capillary: 133 mg/dL — ABNORMAL HIGH (ref 70–99)

## 2022-06-03 MED ORDER — NYSTATIN 100000 UNIT/GM EX POWD
Freq: Two times a day (BID) | CUTANEOUS | Status: DC
  Filled 2022-06-03: qty 15

## 2022-06-03 MED ORDER — TRACE MINERALS CU-MN-SE-ZN 300-55-60-3000 MCG/ML IV SOLN
INTRAVENOUS | Status: DC
  Filled 2022-06-03: qty 643.2

## 2022-06-03 NOTE — Progress Notes (Signed)
PROGRESS NOTE  Sheri Cooper Geoffroy JYN:[id number] DOB: 1951/03/16 DOA: 05/16/2022 PCP: [facility], [facility]  Brief History:  72 y.o. female with medical history significant of COPD, rheumatoid arthritis, stage V CKD. Admitted on 05/16/2022 with healthcare associated pneumonia and worsening renal function with anion gap metabolic acidosis.  -Patient was recently discharged from [facility] on 05/12/2022 after treatment for COPD.  Nephrology was consulted.  Her hospitalization was prolonged secondary to HCAP and progressive renal failure.  She finished intravenous cefepime on 05/20/2022.  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  She was started on steroids by nephrology.  Pt was started on hemodialysis on 05/18/22 after temporary HD cath placement and had renal biopsy done on 05/19/22.  On 05/24/2022, tunneled dialysis catheter was placed.  An outpatient dialysis center was ultimately found for the patient.  Her first outpatient dialysis will be scheduled on 06/01/2022. Her hospitalization was prolonged secondary to GI bleed.  She was noted to have melena and hematochezia.  She was transfused 3 units total for the hospitalization.  GI was consulted.  On 05/22/2022, the patient underwent EGD and colonoscopy.  Results are discussed below.  Her hemoglobin stabilized.   On the evening of 05/27/22, pt had another large maroon stool.  Pt remains hemodynamically stable.  Case was discussed with GI, Dr. Jenetta Downer.  Case also discussed with nephrology.  It was felt that the benefit was greater than the risk of obtaining CTA of the abdomen and pelvis.   1/25 CTA showed intraperitoneal gas and fluid concerning for visceral perforation.  Based upon the location it was felt that it was likely coming from the first-second portion of the duodenum.  There is no acute bleeding.  General surgery was consulted.  Dr. Constance Haw took the patient to the OR for exploratory laparotomy.  Omental patch  repair was done.  The patient was left intubated overnight. She was extubated on 05/28/22.  She was initially on vasopressors which have been weaned off.  She remained NPO postop, and she was started on TPN. GI was reconsulted but did not feel she had any further active bleeding. Hgb continued to trend down over next 3 days.  After discussion with renal and GI, repeat CT angio abd/pelvis on 1/30.  Assessment/Plan: AKI on CKD 3b -ANCA, ANA, anti-GBM neg, C3/C4 WNL. HIV, Hep B neg. FLC within acceptable limits. SPEP pending  -Anion gap metabolic acidosis noted and sodium bicarbonate -patient sees Dr. Theador Hawthorne as outpatient---discussed with Dr. Lars Masson was concerned that patient would need hemodialysis this admission which has now been started -[place] records from [facility] reviewed -appreciate [facility] follow up -first HD 05/18/22 -Right IJ HD catheter placed by Dr. 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   -continue steroids for now>> convert IV Solu-Medrol until able to take p.o. -Well-tolerated dialysis on 06/02/2022; next anticipated treatment on 06/05/2022. -Continue to follow recommendations by nephrology service.   Sepsis -secondary to UTI and peritonitis -on vasopressors for brief time>>weaned off -zosyn started for peritonitis>>d/c after cultures returnd -zyvox for VRE in urine and peritoneum   Pneumoperitoneum/Peritonitis -Noted on CTA abdomen 05/27/2022 -Location likely in the first-second portion of the duodenum -05/27/22 Exploratory laparotomy with omental patch -Continue TPN -E faecium in peritoneal culture>> continue  treatment with Zyvox and fluconazole. -UGI appears to be stable; okay to advance diet to clear liquids and  discontinue NG tube.   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 -Planning for 10 days total (last date of antibiotics anticipated 06/10/22)   Acute respiratory failure with hypoxia -Patient left intubated postoperatively; successfully extubated on 05/28/2022 -Demonstrating good saturation on room air and no using accessory muscle -Continue supportive care.   Guaiac Positive Stools / GI bleeding/ ABLA Bleeding duodenal ulcers s/p clip x1, epinephrine and coagulation treatments -Continue PPI.  Esophagitis and Gastritis  -large bloody BM noted by RN guaiac positive on 1/18 -DC'd subcutaneous heparin on 1/18 -EGD / Colonoscopy planned for 05/22/22 with Dr. Jenetta Downer - with findings of grade C esophagitis and gastritis with ulcers, multiple duodenal ulcers with  adherent blood clots, bleeding vessel clip x 1, epinephrine injection given and APC; however second vessel could not be clipped. Currently on PP  -initially finished 72 hours IV pantoprazole drip>>IV bid -05/22/21 colonoscopy-blood in entire colon  -05/27/22--had another large maroon stool -05/27/2022 CT abdomen--no active GI bleed, but pneumoperitoneum found. -GI reconsulted -Transfused a total of 9 units PRBC throughout hospitalization -Hemoglobin currently stable; no signs of ongoing bleeding appreciated at this time. -UGI performed following general surgery recommendations demonstrated no abnormalities and is stable for NG tube removal. -Patient so far tolerating clear liquid diet; planning for continue slowly advancing as tolerated. -Continue PPI twice a day. -Continue to follow-up results of gastrin level -GI planning to repeat endoscopy down the road and to empirically treat for H. pylori as an outpatient. -Hemoglobin stable (12.5 last checked)   COPD  -- stable overall. -continue bronchodilators -Pt is on steroids per nephrology for AKI -No wheezing appreciated on exam.    Hypomagnesemia/hypokalemia>>Hyperkalemia -electrolytes overall improved -now on HD. -Continue to follow electrolytes trend and further replete adjust as needed with dialysis.   Thrombocytopenia  -stopped [place] heparin on 1/18 -checking HIT antibody labs - NEG - likely due to acute medical illness. -Continue to follow platelet count. -No overt bleeding appreciated.   Incidental finding of multiple renal lesions CT  of chest showed multiple renal lesions bilaterally, incompletely characterized on today's noncontrast CT examination, including an indeterminate 1.9 cm lesion in the anterior aspect of the interpolar region of the left kidney.  Follow-up nonemergent outpatient abdominal MRI with and without IV gadolinium is recommended in the near future to definitively evaluate this lesion and exclude neoplasm. -Outpatient workup.   Elevated BNP--echo from 05/10/2022 at [facility] available in [facility] reveals-  The left ventricle is normal in size with normal wall thickness. The left ventricular systolic function is normal, LVEF is visually estimated at 60-65%. There is normal left ventricular diastolic function.  -There is mild to moderate pulmonary hypertension no significant valvular abnormalities. -hemodialysis to manage volume status -Heart healthy/low-sodium discussed with patient.   Diarrhea - resolved now. - c diff testing was negative  -Continue supportive care.      Family Communication:   brother updated   Consultants:  GI, renal, general surgery   Code Status:  FULL    DVT Prophylaxis:  SCDs     Procedures: As Listed in Progress Note Above   Antibiotics: Zosyn 1/26>>1/28 Fluconzole 1/26>> Zyvox 1/28>>    Subjective: Overt bleeding; no chest pain, no nausea, no vomiting.  Tolerating clear liquid diet.  NG tube has been removed.   Objective: Vitals:   06/02/22 2306 06/03/22 0445 06/03/22 0500 06/03/22  1000  BP: (!) 181/60 (!) 174/68  (!) 190/52   Pulse: 94 92  95  Resp: '17 18  20  '$ Temp: 98.8 F (37.1 C) 98.7 F (37.1 C)  98.7 F (37.1 C)  TempSrc:    Oral  SpO2: 97% 96%  97%  Weight:   56.1 kg   Height:        Intake/Output Summary (Last 24 hours) at 06/03/2022 1404 Last data filed at 06/03/2022 0600 Gross per 24 hour  Intake 1054.52 ml  Output 151.2 ml  Net 903.32 ml   Weight change: -4.7 kg Exam: General exam: Alert, awake, oriented x 3; tolerating liquid diet; NG tube removed since 06/02/2022 evening and so far well-tolerated.  No fever, reporting no nausea or vomiting currently.  No overt bleeding. Respiratory system: Clear to auscultation. Respiratory effort normal.  Good saturation on room air. Cardiovascular system:RRR. No rubs or gallops; no JVD. Gastrointestinal system: Abdomen is slightly distended; JP drain in place with ongoing serosanguineous drainage (amount slowly decreasing); positive bowel sounds appreciated. Central nervous system: Alert and oriented. No focal neurological deficits. Extremities: No cyanosis or clubbing; 1+ edema appreciated bilaterally. Skin: No petechiae; positive fungal dermatitis appreciated under her breast. Psychiatry: Judgement and insight appear normal. Mood & affect appropriate.   Data Reviewed: I have personally reviewed following labs and imaging studies  Basic Metabolic Panel: Recent Labs  Lab 05/30/22 0500 05/31/22 0515 06/01/22 0404 06/01/22 0850 06/02/22 0407 06/03/22 0355 06/03/22 0356  NA 135 132* 132*  --  132*  130* 130* 132*  K 4.6 5.4* 3.7  --  3.4*  3.4* 3.4* 3.5  CL 99 100 98  --  99  97* 97* 97*  CO2 '25 22 26  '$ --  23  20* 26 26  GLUCOSE 146* 160* 194*  --  134*  133* 140* 140*  BUN 60* 87* 53*  --  79*  78* 57* 56*  CREATININE 1.88* 2.34* 1.70*  --  2.13*  2.20* 1.68* 1.72*  CALCIUM 6.7* 7.3*  7.0* 6.8*  --  7.1*  7.0* 6.9* 7.1*  MG 1.7 2.6*  --  1.8 1.8  --  1.8  PHOS 2.3* 3.8  3.7 3.9  --  4.7* 2.8 2.8   Liver Function Tests: Recent Labs   Lab 05/28/22 0803 05/29/22 0508 05/30/22 0500 05/31/22 0515 06/01/22 0404 06/02/22 0407 06/03/22 0355 06/03/22 0356  AST 24  --  17 32  --   --   --  61*  ALT 6  --  5 13  --   --   --  70*  ALKPHOS 70  --  61 77  --   --   --  138*  BILITOT 0.6  --  0.3  --   --   --   --  0.4  PROT 4.7*  --  4.0* 4.3*  --   --   --  4.2*  ALBUMIN 2.1*   < > 1.7* 1.9* 1.8* 2.1* 2.0* 1.9*   < > = values in this interval not displayed.   CBC: Recent Labs  Lab 05/28/22 0714 05/28/22 1208 05/30/22 0751 05/30/22 1117 05/31/22 1133 06/01/22 0404 06/01/22 0700 06/02/22 0407 06/02/22 1110  WBC 18.6*  --  17.8*  --   --  14.1*  --  13.4*  --   HGB 10.5*   < > 7.7*   < > 7.1* 6.4* 5.5* 11.0* 12.5  HCT 31.7*   < > 24.5*   < >  22.5* 20.6* 17.4* 34.6* 38.3  MCV 81.1  --  75.9*  --   --  76.6*  --  80.7  --   PLT 147*  --  132*  --   --  126*  --  141*  --    < > = values in this interval not displayed.   CBG: Recent Labs  Lab 06/02/22 1117 06/02/22 1628 06/02/22 2352 06/03/22 0802 06/03/22 1203  GLUCAP 118* 127* 187* 153* 151*   Urine analysis:    Component Value Date/Time   COLORURINE YELLOW 05/28/2022 1005   APPEARANCEUR CLOUDY (A) 05/28/2022 1005   LABSPEC 1.018 05/28/2022 1005   PHURINE 7.0 05/28/2022 1005   GLUCOSEU NEGATIVE 05/28/2022 1005   HGBUR SMALL (A) 05/28/2022 1005   BILIRUBINUR NEGATIVE 05/28/2022 1005   KETONESUR NEGATIVE 05/28/2022 1005   PROTEINUR >=300 (A) 05/28/2022 1005   NITRITE NEGATIVE 05/28/2022 1005   LEUKOCYTESUR MODERATE (A) 05/28/2022 1005   Sepsis Labs: Recent Results (from the past 240 hour(s))  Aerobic/Anaerobic Culture w Gram Stain (surgical/deep wound)     Status: None   Collection Time: 05/27/22 11:45 PM   Specimen: PATH Cytology Peritoneal fluid; Body Fluid  Result Value Ref Range Status   Specimen Description   Final    PERITONEAL FLUID Performed at [facility], [address] [place], [place] [address]    Special Requests   Final     NONE Performed at [facility], [address], [place], [place] [address]    Gram Stain   Final    RARE WBC PRESENT, PREDOMINANTLY PMN NO ORGANISMS SEEN    Culture   Final    FEW ENTEROCOCCUS FAECIUM VANCOMYCIN RESISTANT ENTEROCOCCUS ISOLATED NO ANAEROBES ISOLATED Performed at [facility], [place] [address], [place], [place] [address]    Report Status 06/02/2022 FINAL  Final   Organism ID, Bacteria ENTEROCOCCUS FAECIUM  Final      Susceptibility   Enterococcus faecium - MIC*    AMPICILLIN >=32 RESISTANT Resistant     VANCOMYCIN >=32 RESISTANT Resistant     GENTAMICIN SYNERGY SENSITIVE Sensitive     LINEZOLID 2 SENSITIVE Sensitive     * FEW ENTEROCOCCUS FAECIUM  Urine Culture (for pregnant, neutropenic or urologic patients or patients with an indwelling urinary catheter)     Status: Abnormal   Collection Time: 05/28/22 10:05 AM   Specimen: Urine, Catheterized  Result Value Ref Range Status   Specimen Description   Final    URINE, CATHETERIZED Performed at [facility], [address] [place], [place] [address]    Special Requests   Final    NONE Performed at [facility], [address], [place], [place] [address]    Culture (A)  Final    >=100,000 COLONIES/mL ENTEROCOCCUS FAECIUM VANCOMYCIN RESISTANT ENTEROCOCCUS ISOLATED    Report Status 05/30/2022 FINAL  Final   Organism ID, Bacteria ENTEROCOCCUS FAECIUM (A)  Final      Susceptibility   Enterococcus faecium - MIC*    AMPICILLIN >=32 RESISTANT Resistant     NITROFURANTOIN 64 INTERMEDIATE Intermediate     VANCOMYCIN >=32 RESISTANT Resistant     LINEZOLID 2 SENSITIVE Sensitive     * >=100,000 COLONIES/mL ENTEROCOCCUS FAECIUM     Scheduled Meds:  sodium chloride   Intravenous Once   Chlorhexidine Gluconate Cloth  6 each Topical Daily   Chlorhexidine Gluconate Cloth  6 each Topical Q0600   darbepoetin (ARANESP) injection - DIALYSIS  200 mcg Subcutaneous Q Tue-1800  hydrALAZINE  5 mg Intravenous Q8H   insulin  aspart  0-9 Units Subcutaneous Q8H   methylPREDNISolone (SOLU-MEDROL) injection  30 mg Intravenous Daily   nystatin   Topical BID   pantoprazole  40 mg Intravenous Q12H   Continuous Infusions:  albumin human     fluconazole (DIFLUCAN) IV 200 mg (06/03/22 0302)   linezolid (ZYVOX) IV 600 mg (06/03/22 1157)   TPN ADULT (ION) 60 mL/hr at 06/02/22 1650    Procedures/Studies: DG UGI W SMALL BOWEL  Result Date: 06/02/2022 CLINICAL DATA:  Post repair of perforated ulcer EXAM: WATER SOLUBLE UPPER GI SERIES TECHNIQUE: Single-column upper GI series was performed using water soluble contrast. Radiation Exposure Index (as provided by the fluoroscopic device): 23.1 mGy Kerma CONTRAST:  100 cc Omnipaque 300 IV COMPARISON:  None available FLUOROSCOPY: Fluoroscopy Time:  1 minute 24 seconds Radiation Exposure Index (if provided by the fluoroscopic device): 23.1 mGy Number of Acquired Spot Images: Multiple spots FINDINGS: Normal gastric distension. Wall thickening at the rugal folds from mid stomach through antrum. Additional wall thickening of the pylorus and duodenal bulb. No contrast extravasation identified to suggest postoperative leak. Contrast opacifies the duodenal sweep with normal position of ligament of Treitz. No extraluminal contrast identified nor evidence of contrast opacification of the indwelling JP drain. IMPRESSION: Wall thickening of mid stomach throughout duodenal bulb consistent with edema related to prior ulcer and subsequent surgery. No evidence of postoperative leak. Electronically Signed   By: Lavonia Dana M.D.   On: 06/02/2022 11:42   DG Chest Port 1 View  Result Date: 05/28/2022 CLINICAL DATA:  Respiratory failure EXAM: PORTABLE CHEST 1 VIEW COMPARISON:  05/25/2022 FINDINGS: Endotracheal tube is seen 3.5 cm above the carina. Nasogastric tube extends into the upper abdomen beyond the margin of the examination. Left internal jugular central venous catheter tip within the superior vena  cava. Right internal jugular hemodialysis catheter tip noted within the right atrium. The lungs are symmetrically well expanded. Partial left lower lobe collapse noted, however, with retrocardiac consolidation. Mild relative opacification the right hemithorax suggest the presence of a posteriorly layering pleural effusion on this supine examination. Superimposed trace perihilar interstitial pulmonary infiltrate is in keeping with trace probable interstitial pulmonary edema. No pneumothorax. Cardiac size is within normal limits. IMPRESSION: 1. Support lines and tubes in appropriate position. 2. Partial left lower lobe collapse. 3. Suspected posteriorly layering right pleural effusion. 4. Trace probable interstitial pulmonary edema. Electronically Signed   By: Fidela Salisbury M.D.   On: 05/28/2022 03:32   CT ANGIO GI BLEED  Result Date: 05/27/2022 CLINICAL DATA:  Recurrent gastrointestinal hemorrhage EXAM: CTA ABDOMEN AND PELVIS WITHOUT AND WITH CONTRAST TECHNIQUE: Multidetector CT imaging of the abdomen and pelvis was performed using the standard protocol during bolus administration of intravenous contrast. Multiplanar reconstructed images and MIPs were obtained and reviewed to evaluate the vascular anatomy. RADIATION DOSE REDUCTION: This exam was performed according to the departmental dose-optimization program which includes automated exposure control, adjustment of the mA and/or kV according to patient size and/or use of iterative reconstruction technique. CONTRAST:  67m OMNIPAQUE IOHEXOL 350 MG/ML SOLN COMPARISON:  None Available. FINDINGS: VASCULAR Aorta: Moderate mixed atherosclerotic plaque. No aortic aneurysm or dissection. No periaortic inflammatory change identified. Celiac: Patent without evidence of aneurysm, dissection, vasculitis or significant stenosis. SMA: Patent without evidence of aneurysm, dissection, vasculitis or significant stenosis. Renals: High-grade (near occlusion) stenosis of the a  left renal artery origin. Less than 50% stenosis of the right renal artery origin.  Normal vascular morphology. Relatively diminutive caliber of the left renal arterial vasculature distal the stenosis may relate to under filling. No aneurysm or dissection. IMA: Occluded at its origin with reconstitution via the marginal artery. 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 Focal irregular minimum 50% stenosis of the left common iliac artery secondary to a short segment dissection flap within the proximal left common iliac artery. Right internal iliac artery is heavily diseased and occluded at its origin with subsequent reconstitution. High-grade focal stenosis of the left internal iliac artery at its origin. Proximal Outflow: Bilateral common femoral and visualized portions of the superficial and profunda femoral arteries are patent without evidence of aneurysm, dissection, vasculitis or significant stenosis. Veins: Mesenteric, splenic, portal venous, and hepatic venous structures are patent. Iliofemoral veins are patent. Renal veins and inferior vena cava are patent. Review of the MIP images confirms the above findings. NON-VASCULAR Lower chest: Small left and moderate right pleural effusions are present with bibasilar compressive atelectasis. Hepatobiliary: Cholelithiasis without pericholecystic inflammatory change. Liver unremarkable. No intra or extrahepatic biliary ductal dilation. Pancreas: Unremarkable Spleen: Unremarkable Adrenals/Urinary Tract: The adrenal glands are unremarkable. Moderate left renal cortical atrophy. Right kidney is normal in size. 6 mm plaque-like nonobstructing calculus noted within the upper pole the right kidney. Multiple simple cortical cysts are seen within the right kidney. Hypodense 16 mm lesion is seen within the interpolar region of the right kidney demonstrates no significant enhancement is compatible with a Bosniak class 2 cyst. No follow-up  imaging is recommended for these lesions. No hydronephrosis. The bladder is unremarkable. Stomach/Bowel: There is mild free intraperitoneal gas present. The exact site of visceral perforation is not clearly identified on this examination, however, there is extraluminal gas and debris surrounding the first and proximal second portion of the duodenum raising the question of a perforation in this location. Endoscopic clip noted within the juncture of the first and second portion of the duodenum. Moderate free intraperitoneal fluid. Endoscopic clip laying dependently within the cecum. The stomach, small bowel, and large bowel are otherwise unremarkable. No active gastrointestinal hemorrhage identified. Fluid within the descending colon and rectal vault may present clinically as diarrhea. Lymphatic: No pathologic adenopathy within the abdomen and pelvis. Reproductive: Involuted fibroid within the uterus. The pelvic organs are otherwise unremarkable. Other: Moderate subcutaneous body wall edema noted dependently. Musculoskeletal: No acute bone abnormality. No lytic or blastic bone lesion. IMPRESSION: VASCULAR . 1. 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 2. 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 Focal irregular minimum 50% stenosis of the left common iliac artery secondary to a short segment dissection flap. 3. 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 High-grade focal stenosis of the left internal iliac artery at its origin. 4. 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 NON-VASCULAR . 1. Moderate free intraperitoneal gas and fluid in keeping with visceral perforation. The exact site is not clearly identified on this examination, however, more focal debris and gas adjacent to the juncture of the first  and second portion of the duodenum raises the question of a perforation in this location. 2. No active gastrointestinal hemorrhage identified 3. Cholelithiasis. 4. Mild right nonobstructing nephrolithiasis. 5. Fluid within the descending colon and rectal vault may present clinically as diarrhea. Aortic Atherosclerosis (ICD10-I70.0). Electronically Signed   By: Cassandria Anger  Christa See M.D.   On: 05/27/2022 21:51   DG CHEST PORT 1 VIEW  Result Date: 05/25/2022 CLINICAL DATA:  Chest pain EXAM: PORTABLE CHEST 1 VIEW COMPARISON:  05/22/2022 FINDINGS: Right internal jugular approach hemodialysis catheter terminates at the level of the right atrium. Stable heart size. Small right pleural effusion with associated right basilar opacity, similar to prior. Similar prominent interstitial markings bilaterally. No pneumothorax. IMPRESSION: 1. Small right pleural effusion with associated right basilar opacity, similar to prior. 2. Similar prominent interstitial markings bilaterally, likely mild edema. Electronically Signed   By: Davina Poke D.O.   On: 05/25/2022 11:39   DG Abd 1 View  Result Date: 05/25/2022 CLINICAL DATA:  Abdominal pain EXAM: ABDOMEN - 1 VIEW COMPARISON:  05/22/2022 FINDINGS: The bowel gas pattern is normal. No radio-opaque calculi or other significant radiographic abnormality are seen. Surgical clips overlie the pelvis and right upper quadrant. Calcification consistent with fibroid noted. IMPRESSION: Unremarkable bowel gas pattern. Calcified fibroid incidentally noted Electronically Signed   By: Sammie Bench M.D.   On: 05/25/2022 08:22   IR Fluoro Guide CV Line Right  Result Date: 05/24/2022 INDICATION: 72 year old female in need of durable dialysis access. She presents for removal temporary dialysis catheter in placement of a tunneled dialysis catheter. EXAM: TUNNELED CENTRAL VENOUS HEMODIALYSIS CATHETER PLACEMENT WITH ULTRASOUND AND FLUOROSCOPIC GUIDANCE MEDICATIONS: 2 g Ancef. The antibiotic was  given in an appropriate time interval prior to skin puncture. ANESTHESIA/SEDATION: Moderate (conscious) sedation was employed during this procedure. A total of Versed 1 mg and Fentanyl 25 mcg was administered intravenously. Moderate Sedation Time: 11 minutes. The patient's level of consciousness and vital signs were monitored continuously by radiology nursing throughout the procedure under my direct supervision. FLUOROSCOPY TIME:  Radiation exposure index: 1 mGy reference air kerma COMPLICATIONS: None immediate. PROCEDURE: Informed written consent was obtained from the patient after a discussion of the risks, benefits, and alternatives to treatment. Questions regarding the procedure were encouraged and answered. The right neck and chest were prepped with chlorhexidine in a sterile fashion, and a sterile drape was applied covering the operative field. Maximum barrier sterile technique with sterile gowns and gloves were used for the procedure. A timeout was performed prior to the initiation of the procedure. After creating a small venotomy incision, a micropuncture kit was utilized to access the right internal jugular vein under direct, real-time ultrasound guidance after the overlying soft tissues were anesthetized with 1% lidocaine with epinephrine. Ultrasound image documentation was performed. The microwire was kinked to measure appropriate catheter length. A stiff Glidewire was advanced to the level of the IVC and the micropuncture sheath was exchanged for a peel-away sheath. A palindrome tunneled hemodialysis catheter measuring 19 cm from tip to cuff was tunneled in a retrograde fashion from the anterior chest wall to the venotomy incision. The catheter was then placed through the peel-away sheath with tips ultimately positioned within the superior aspect of the right atrium. Final catheter positioning was confirmed and documented with a spot radiographic image. The catheter aspirates and flushes normally. The  catheter was flushed with appropriate volume heparin dwells. The catheter exit site was secured with a 0-Prolene retention suture. The venotomy incision was closed with an interrupted 4-0 Vicryl, Dermabond and Steri-strips. Dressings were applied. The patient tolerated the procedure well without immediate post procedural complication. IMPRESSION: Successful placement of 19 cm tip to cuff tunneled hemodialysis catheter via the right internal jugular vein with tips terminating within the superior aspect of the right atrium. The catheter  is ready for immediate use. Electronically Signed   By: Jacqulynn Cadet M.D.   On: 05/24/2022 12:39   IR US Guide Vasc Access Right  Result Date: 05/24/2022 INDICATION: 72 year old female in need of durable dialysis access. She presents for removal temporary dialysis catheter in placement of a tunneled dialysis catheter. EXAM: TUNNELED CENTRAL VENOUS HEMODIALYSIS CATHETER PLACEMENT WITH ULTRASOUND AND FLUOROSCOPIC GUIDANCE MEDICATIONS: 2 g Ancef. The antibiotic was given in an appropriate time interval prior to skin puncture. ANESTHESIA/SEDATION: Moderate (conscious) sedation was employed during this procedure. A total of Versed 1 mg and Fentanyl 25 mcg was administered intravenously. Moderate Sedation Time: 11 minutes. The patient's level of consciousness and vital signs were monitored continuously by radiology nursing throughout the procedure under my direct supervision. FLUOROSCOPY TIME:  Radiation exposure index: 1 mGy reference air kerma COMPLICATIONS: None immediate. PROCEDURE: Informed written consent was obtained from the patient after a discussion of the risks, benefits, and alternatives to treatment. Questions regarding the procedure were encouraged and answered. The right neck and chest were prepped with chlorhexidine in a sterile fashion, and a sterile drape was applied covering the operative field. Maximum barrier sterile technique with sterile gowns and gloves were  used for the procedure. A timeout was performed prior to the initiation of the procedure. After creating a small venotomy incision, a micropuncture kit was utilized to access the right internal jugular vein under direct, real-time ultrasound guidance after the overlying soft tissues were anesthetized with 1% lidocaine with epinephrine. Ultrasound image documentation was performed. The microwire was kinked to measure appropriate catheter length. A stiff Glidewire was advanced to the level of the IVC and the micropuncture sheath was exchanged for a peel-away sheath. A palindrome tunneled hemodialysis catheter measuring 19 cm from tip to cuff was tunneled in a retrograde fashion from the anterior chest wall to the venotomy incision. The catheter was then placed through the peel-away sheath with tips ultimately positioned within the superior aspect of the right atrium. Final catheter positioning was confirmed and documented with a spot radiographic image. The catheter aspirates and flushes normally. The catheter was flushed with appropriate volume heparin dwells. The catheter exit site was secured with a 0-Prolene retention suture. The venotomy incision was closed with an interrupted 4-0 Vicryl, Dermabond and Steri-strips. Dressings were applied. The patient tolerated the procedure well without immediate post procedural complication. IMPRESSION: Successful placement of 19 cm tip to cuff tunneled hemodialysis catheter via the right internal jugular vein with tips terminating within the superior aspect of the right atrium. The catheter is ready for immediate use. Electronically Signed   By: Jacqulynn Cadet M.D.   On: 05/24/2022 12:39   DG CHEST PORT 1 VIEW  Result Date: 05/22/2022 CLINICAL DATA:  Evaluate for intra-abdominal free air. EXAM: PORTABLE CHEST 1 VIEW COMPARISON:  Abdominal radiograph 05/22/2022 FINDINGS: Central venous catheter tip projects over the superior vena cava. Stable cardiac and mediastinal  contours. Small layering right pleural effusion with underlying consolidation. No pneumothorax. No definite free air no definite gas identified underlying the hemidiaphragms. IMPRESSION: 1. Small layering right pleural effusion with underlying consolidation. 2. No definite free air identified underlying the hemidiaphragms. Consider further evaluation with decubitus imaging of the abdomen if there is persistent clinical concern given the limitations of this portable semi-erect chest radiograph. Electronically Signed   By: Lovey Newcomer M.D.   On: 05/22/2022 16:02   DG Abd 1 View  Result Date: 05/22/2022 CLINICAL DATA:  Postoperative abdominal pain. EXAM: ABDOMEN - 1 VIEW  COMPARISON:  None Available. FINDINGS: Gas is demonstrated within nondilated loops of large and small bowel in a nonobstructed pattern. Supine evaluation limited for the detection of free intraperitoneal air. Lumbar spine degenerative changes. Calcific density projecting over the pelvis, nonspecific, potentially calcified uterine fibroid. IMPRESSION: Nonobstructed bowel-gas pattern. Electronically Signed   By: Lovey Newcomer M.D.   On: 05/22/2022 14:08   DG CHEST PORT 1 VIEW  Result Date: 05/21/2022 CLINICAL DATA:  Leukocytosis EXAM: PORTABLE CHEST 1 VIEW COMPARISON:  05/18/2022 FINDINGS: Central venous line tip in distal SVC. Normal cardiac silhouette. Small RIGHT effusion. Mild venous congestion. No pneumothorax. No focal consolidation. Lymphadenectomy clips in the LEFT axilla. No significant interval change. IMPRESSION: 1. No significant interval change. 2. Small RIGHT effusion. 3. Mild venous congestion. Electronically Signed   By: Suzy Bouchard M.D.   On: 05/21/2022 08:22   US BIOPSY (KIDNEY)  Result Date: 05/19/2022 INDICATION: 72 year old female with history of end-stage renal disease. EXAM: ULTRASOUND GUIDED RENAL BIOPSY COMPARISON:  None Available. MEDICATIONS: None. ANESTHESIA/SEDATION: Fentanyl 25 mcg IV; Versed 0.5 mg IV Total  Moderate Sedation time: 10 minutes; The patient was continuously monitored during the procedure by the interventional radiology nurse under my direct supervision. COMPLICATIONS: None immediate. PROCEDURE: Informed written consent was obtained from the patient after a discussion of the risks, benefits and alternatives to treatment. The patient understands and consents the procedure. A timeout was performed prior to the initiation of the procedure. Ultrasound scanning was performed of the bilateral flanks. The inferior pole of the left kidney was selected for biopsy due to location and sonographic window. The procedure was planned. The operative site was prepped and draped in the usual sterile fashion. The overlying soft tissues were anesthetized with 1% lidocaine with epinephrine. A 17 gauge coaxial introducer needle was advanced into the inferior cortex of the left kidney and 2 core biopsies were obtained under direct ultrasound guidance with a 16 gauge core biopsy device. Images were saved for documentation purposes. The biopsy device was removed and hemostasis was obtained with manual compression after injection of Gel-Foam slurry along the needle track under ultrasound guidance. Post procedural scanning was negative for significant post procedural hemorrhage or additional complication. A dressing was placed. The patient tolerated the procedure well without immediate post procedural complication. IMPRESSION: Technically successful ultrasound guided left renal biopsy. Ruthann Cancer, MD [facility] Electronically Signed   By: Ruthann Cancer M.D.   On: 05/19/2022 14:19   DG Chest Port 1 View  Result Date: 05/18/2022 CLINICAL DATA:  Central line placement. EXAM: PORTABLE CHEST 1 VIEW COMPARISON:  CT chest and chest x-ray from 2 days ago. FINDINGS: New right internal jugular central venous catheter with tip at the cavoatrial junction. The heart size and  mediastinal contours are within normal limits. Mild diffuse interstitial thickening similar. Decreasing small right and unchanged small left pleural effusions. Increasing asymmetric density in the peripheral left upper lobe. Similar atelectasis in the peripheral right middle lobe. Improving right lower lobe atelectasis. No pneumothorax. No acute osseous abnormality. IMPRESSION: 1. New right internal jugular central venous catheter with tip at the cavoatrial junction. No pneumothorax. 2. Similar diffuse interstitial thickening with increasing asymmetric density in the peripheral left upper lobe. Differential considerations include infection or pulmonary edema. 3. Decreased small right and unchanged small left pleural effusions. Electronically Signed   By: Titus Dubin M.D.   On: 05/18/2022 14:06   US RENAL  Result Date: 05/18/2022 CLINICAL DATA:  Chronic kidney  disease EXAM: RENAL / URINARY TRACT ULTRASOUND COMPLETE COMPARISON:  Abdomen CT report 01/13/2018 FINDINGS: Right Kidney: Renal measurements: 8.9 x 4.7 x 4.4 = volume: 97 mL. Kidney appears echogenic. No collecting system dilatation. There is a simple cyst along the midportion of the kidney which is anechoic with through transmission measuring 17 mm. Left Kidney: Renal measurements: 7.5 x 3.9 x 3.2 = volume: 49 mL. Small echogenic appearing kidney. No collecting system dilatation. Anechoic rounded structure seen in the mid kidney measuring 2 cm. Through transmission is smoothly marginated. Bladder: Appears normal for degree of bladder distention. Other: Scattered ascites.  Probable right-sided pleural effusion. IMPRESSION: Echogenic small appearing kidneys. No collecting system dilatation. Bilateral simple appearing renal cysts. Ascites.  Right pleural effusion Electronically Signed   By: Jill Side M.D.   On: 05/18/2022 11:07   CT Chest Wo Contrast  Result Date: 05/16/2022 CLINICAL DATA:  72 year old female with history of suspected pneumonia.  EXAM: CT CHEST WITHOUT CONTRAST TECHNIQUE: Multidetector CT imaging of the chest was performed following the standard protocol without IV contrast. RADIATION DOSE REDUCTION: This exam was performed according to the departmental dose-optimization program which includes automated exposure control, adjustment of the mA and/or kV according to patient size and/or use of iterative reconstruction technique. COMPARISON:  Chest x-ray 05/16/2022. FINDINGS: Cardiovascular: Heart size is normal. There is no significant pericardial fluid, thickening or pericardial calcification. Aortic atherosclerosis. No definite coronary artery calcifications. Mild calcifications of the aortic valve. Mediastinum/Nodes: No pathologically enlarged mediastinal or hilar lymph nodes. Please note that accurate exclusion of hilar adenopathy is limited on noncontrast CT scans. Esophagus is unremarkable in appearance. No axillary lymphadenopathy. Lungs/Pleura: Moderate to large right and small to moderate left pleural effusions lying dependently associated with areas of passive subsegmental atelectasis in the lower lobes of the lungs bilaterally. In addition, there are mid to upper lung predominant patchy areas of ground-glass attenuation and nodular appearing consolidative airspace disease, likely to reflect multilobar bilateral bronchopneumonia. The largest of these nodular appearing regions in the apex of the left upper lobe (axial image 32 of series 4) measuring 1.3 x 1.2 cm. Upper Abdomen: Aortic atherosclerosis. Tiny partially calcified gallstones lying dependently in the fundus of the gallbladder. Small volume of ascites. Multiple renal lesions bilaterally, incompletely characterized on today's noncontrast CT examination, most notable for an exophytic intermediate attenuation lesion (25 HU) measuring 1.9 cm in the anterior aspect of the interpolar region of the left kidney. A cluster of nonobstructive calculi is also noted in the posterior  aspect of the upper pole collecting system of the right kidney measuring up to 3 mm. Musculoskeletal: There are no aggressive appearing lytic or blastic lesions noted in the visualized portions of the skeleton. IMPRESSION: 1. The appearance the chest is most suggestive of multilobar bilateral bronchopneumonia with bilateral parapneumonic pleural effusions, as above. 2. Given the nodular appearance of some of the areas of apparent airspace consolidation, follow-up noncontrast chest CT is recommended in 3 months to ensure resolution of the nodular areas following treatment for the patient's acute illness, to exclude underlying neoplasm. 3. Multiple renal lesions bilaterally, incompletely characterized on today's noncontrast CT examination, including an indeterminate 1.9 cm lesion in the anterior aspect of the interpolar region of the left kidney. Follow-up nonemergent outpatient abdominal MRI with and without IV gadolinium is recommended in the near future to definitively evaluate this lesion and exclude neoplasm. 4. Cholelithiasis. 5. Nonobstructive calculi in the right renal collecting system measuring up to 3 mm. 6. Ascites. 7. Aortic  atherosclerosis. Aortic Atherosclerosis (ICD10-I70.0). Electronically Signed   By: Vinnie Langton M.D.   On: 05/16/2022 05:23   DG Chest Port 1 View  Result Date: 05/16/2022 CLINICAL DATA:  Shortness of breath EXAM: PORTABLE CHEST 1 VIEW COMPARISON:  05/07/2022 FINDINGS: Cardiac shadow is enlarged but stable. Small pleural effusion on the right is again noted. Some increased basilar density is noted on the right when compared with the prior study. Postsurgical changes in the left breast are seen. No bony abnormality is noted. IMPRESSION: Persistent right-sided effusion with slight increase in basilar infiltrate on the right. Electronically Signed   By: Inez Catalina M.D.   On: 05/16/2022 03:45    Roque Lias  [facility]  If 7PM-7AM, please contact  night-coverage [email or web address] Password [email or web address] 06/03/2022, 2:04 PM   LOS: 18 days

## 2022-06-03 NOTE — Progress Notes (Signed)
Gastroenterology Progress Note   Referring Provider: No ref. provider found Primary Care Physician:  [facility] Primary Gastroenterologist:  Dr. Jenetta Downer  Patient ID: Sheri Cooper; [id number]; 1950-08-21   Subjective:    Feeling better today. Stools are less, lighter in color. Starts clear liquids last night. Ate bites of jello this morning.   Objective:   Vital signs in last 24 hours: Temp:  [97.6 F (36.4 C)-98.8 F (37.1 C)] 98.7 F (37.1 C) (02/01 0445) Pulse Rate:  [92-94] 92 (02/01 0445) Resp:  [10-20] 18 (02/01 0445) BP: (161-209)/(60-92) 174/68 (02/01 0445) SpO2:  [96 %-97 %] 96 % (02/01 0445) Weight:  [56.1 kg-56.9 kg] 56.1 kg (02/01 0500) Last BM Date : 06/01/22 General:   Alert,  Well-developed, well-nourished, pleasant and cooperative in NAD Head:  Normocephalic and atraumatic. Eyes:  Sclera clear, no icterus.   Abdomen:  Soft, mildly distended. JP drain with clear fluid   Extremities:  Without edema. Neurologic:  Alert and  oriented x4;  grossly normal neurologically. Skin:  Intact without significant lesions or rashes. Psych:  Alert and cooperative. Normal mood and affect.  Intake/Output from previous day: 01/31 0701 - 02/01 0700 In: 1294.5 [P.O.:240; I.V.:659.1; IV Piggyback:395.4] Out: 431.2 [Emesis/NG output:200; Drains:80] Intake/Output this shift: No intake/output data recorded.  Lab Results: CBC Recent Labs    06/01/22 0404 06/01/22 0700 06/02/22 0407 06/02/22 1110  WBC 14.1*  --  13.4*  --   HGB 6.4* 5.5* 11.0* 12.5  HCT 20.6* 17.4* 34.6* 38.3  MCV 76.6*  --  80.7  --   PLT 126*  --  141*  --    BMET Recent Labs    06/02/22 0407 06/03/22 0355 06/03/22 0356  NA 132*  130* 130* 132*  K 3.4*  3.4* 3.4* 3.5  CL 99  97* 97* 97*  CO2 23  20* 26 26  GLUCOSE 134*  133* 140* 140*  BUN 79*  78* 57* 56*  CREATININE 2.13*  2.20* 1.68* 1.72*  CALCIUM 7.1*  7.0* 6.9* 7.1*   LFTs Recent Labs     06/02/22 0407 06/03/22 0355 06/03/22 0356  BILITOT  --   --  0.4  ALKPHOS  --   --  138*  AST  --   --  61*  ALT  --   --  70*  PROT  --   --  4.2*  ALBUMIN 2.1* 2.0* 1.9*   No results for input(s): "LIPASE" in the last 72 hours. PT/INR No results for input(s): "LABPROT", "INR" in the last 72 hours.       Imaging Studies: DG UGI W SMALL BOWEL  Result Date: 06/02/2022 CLINICAL DATA:  Post repair of perforated ulcer EXAM: WATER SOLUBLE UPPER GI SERIES TECHNIQUE: Single-column upper GI series was performed using water soluble contrast. Radiation Exposure Index (as provided by the fluoroscopic device): 23.1 mGy Kerma CONTRAST:  100 cc Omnipaque 300 IV COMPARISON:  None available FLUOROSCOPY: Fluoroscopy Time:  1 minute 24 seconds Radiation Exposure Index (if provided by the fluoroscopic device): 23.1 mGy Number of Acquired Spot Images: Multiple spots FINDINGS: Normal gastric distension. Wall thickening at the rugal folds from mid stomach through antrum. Additional wall thickening of the pylorus and duodenal bulb. No contrast extravasation identified to suggest postoperative leak. Contrast opacifies the duodenal sweep with normal position of ligament of Treitz. No extraluminal contrast identified nor evidence of contrast opacification of the indwelling JP drain. IMPRESSION: Wall thickening of mid stomach throughout duodenal bulb consistent with  edema related to prior ulcer and subsequent surgery. No evidence of postoperative leak. Electronically Signed   By: Lavonia Dana M.D.   On: 06/02/2022 11:42   DG Chest Port 1 View  Result Date: 05/28/2022 CLINICAL DATA:  Respiratory failure EXAM: PORTABLE CHEST 1 VIEW COMPARISON:  05/25/2022 FINDINGS: Endotracheal tube is seen 3.5 cm above the carina. Nasogastric tube extends into the upper abdomen beyond the margin of the examination. Left internal jugular central venous catheter tip within the superior vena cava. Right internal jugular hemodialysis  catheter tip noted within the right atrium. The lungs are symmetrically well expanded. Partial left lower lobe collapse noted, however, with retrocardiac consolidation. Mild relative opacification the right hemithorax suggest the presence of a posteriorly layering pleural effusion on this supine examination. Superimposed trace perihilar interstitial pulmonary infiltrate is in keeping with trace probable interstitial pulmonary edema. No pneumothorax. Cardiac size is within normal limits. IMPRESSION: 1. Support lines and tubes in appropriate position. 2. Partial left lower lobe collapse. 3. Suspected posteriorly layering right pleural effusion. 4. Trace probable interstitial pulmonary edema. Electronically Signed   By: Fidela Salisbury M.D.   On: 05/28/2022 03:32   CT ANGIO GI BLEED  Result Date: 05/27/2022 CLINICAL DATA:  Recurrent gastrointestinal hemorrhage EXAM: CTA ABDOMEN AND PELVIS WITHOUT AND WITH CONTRAST TECHNIQUE: Multidetector CT imaging of the abdomen and pelvis was performed using the standard protocol during bolus administration of intravenous contrast. Multiplanar reconstructed images and MIPs were obtained and reviewed to evaluate the vascular anatomy. RADIATION DOSE REDUCTION: This exam was performed according to the departmental dose-optimization program which includes automated exposure control, adjustment of the mA and/or kV according to patient size and/or use of iterative reconstruction technique. CONTRAST:  35m OMNIPAQUE IOHEXOL 350 MG/ML SOLN COMPARISON:  None Available. FINDINGS: VASCULAR Aorta: Moderate mixed atherosclerotic plaque. No aortic aneurysm or dissection. No periaortic inflammatory change identified. Celiac: Patent without evidence of aneurysm, dissection, vasculitis or significant stenosis. SMA: Patent without evidence of aneurysm, dissection, vasculitis or significant stenosis. Renals: High-grade (near occlusion) stenosis of the a left renal artery origin. Less than 50%  stenosis of the right renal artery origin. Normal vascular morphology. Relatively diminutive caliber of the left renal arterial vasculature distal the stenosis may relate to under filling. No aneurysm or dissection. IMA: Occluded at its origin with reconstitution via the marginal artery. 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 Focal irregular minimum 50% stenosis of the left common iliac artery secondary to a short segment dissection flap within the proximal left common iliac artery. Right internal iliac artery is heavily diseased and occluded at its origin with subsequent reconstitution. High-grade focal stenosis of the left internal iliac artery at its origin. Proximal Outflow: Bilateral common femoral and visualized portions of the superficial and profunda femoral arteries are patent without evidence of aneurysm, dissection, vasculitis or significant stenosis. Veins: Mesenteric, splenic, portal venous, and hepatic venous structures are patent. Iliofemoral veins are patent. Renal veins and inferior vena cava are patent. Review of the MIP images confirms the above findings. NON-VASCULAR Lower chest: Small left and moderate right pleural effusions are present with bibasilar compressive atelectasis. Hepatobiliary: Cholelithiasis without pericholecystic inflammatory change. Liver unremarkable. No intra or extrahepatic biliary ductal dilation. Pancreas: Unremarkable Spleen: Unremarkable Adrenals/Urinary Tract: The adrenal glands are unremarkable. Moderate left renal cortical atrophy. Right kidney is normal in size. 6 mm plaque-like nonobstructing calculus noted within the upper pole the right kidney. Multiple simple cortical cysts are seen within the right kidney.  Hypodense 16 mm lesion is seen within the interpolar region of the right kidney demonstrates no significant enhancement is compatible with a Bosniak class 2 cyst. No follow-up imaging is recommended for these lesions.  No hydronephrosis. The bladder is unremarkable. Stomach/Bowel: There is mild free intraperitoneal gas present. The exact site of visceral perforation is not clearly identified on this examination, however, there is extraluminal gas and debris surrounding the first and proximal second portion of the duodenum raising the question of a perforation in this location. Endoscopic clip noted within the juncture of the first and second portion of the duodenum. Moderate free intraperitoneal fluid. Endoscopic clip laying dependently within the cecum. The stomach, small bowel, and large bowel are otherwise unremarkable. No active gastrointestinal hemorrhage identified. Fluid within the descending colon and rectal vault may present clinically as diarrhea. Lymphatic: No pathologic adenopathy within the abdomen and pelvis. Reproductive: Involuted fibroid within the uterus. The pelvic organs are otherwise unremarkable. Other: Moderate subcutaneous body wall edema noted dependently. Musculoskeletal: No acute bone abnormality. No lytic or blastic bone lesion. IMPRESSION: VASCULAR . 1. 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 2. 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 Focal irregular minimum 50% stenosis of the left common iliac artery secondary to a short segment dissection flap. 3. 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 High-grade focal stenosis of the left internal iliac artery at its origin. 4. 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 NON-VASCULAR . 1. Moderate free intraperitoneal gas and fluid in keeping with visceral perforation. The exact site is not clearly identified on this examination, however, more focal debris and gas adjacent to the juncture of the first and second portion of the duodenum raises  the question of a perforation in this location. 2. No active gastrointestinal hemorrhage identified 3. Cholelithiasis. 4. Mild right nonobstructing nephrolithiasis. 5. Fluid within the descending colon and rectal vault may present clinically as diarrhea. Aortic Atherosclerosis (ICD10-I70.0). Electronically Signed   By: Fidela Salisbury M.D.   On: 05/27/2022 21:51   DG CHEST PORT 1 VIEW  Result Date: 05/25/2022 CLINICAL DATA:  Chest pain EXAM: PORTABLE CHEST 1 VIEW COMPARISON:  05/22/2022 FINDINGS: Right internal jugular approach hemodialysis catheter terminates at the level of the right atrium. Stable heart size. Small right pleural effusion with associated right basilar opacity, similar to prior. Similar prominent interstitial markings bilaterally. No pneumothorax. IMPRESSION: 1. Small right pleural effusion with associated right basilar opacity, similar to prior. 2. Similar prominent interstitial markings bilaterally, likely mild edema. Electronically Signed   By: Davina Poke D.O.   On: 05/25/2022 11:39   DG Abd 1 View  Result Date: 05/25/2022 CLINICAL DATA:  Abdominal pain EXAM: ABDOMEN - 1 VIEW COMPARISON:  05/22/2022 FINDINGS: The bowel gas pattern is normal. No radio-opaque calculi or other significant radiographic abnormality are seen. Surgical clips overlie the pelvis and right upper quadrant. Calcification consistent with fibroid noted. IMPRESSION: Unremarkable bowel gas pattern. Calcified fibroid incidentally noted Electronically Signed   By: Sammie Bench M.D.   On: 05/25/2022 08:22   IR Fluoro Guide CV Line Right  Result Date: 05/24/2022 INDICATION: 72 year old female in need of durable dialysis access. She presents for removal temporary dialysis catheter in placement of a tunneled dialysis catheter. EXAM: TUNNELED CENTRAL VENOUS HEMODIALYSIS CATHETER PLACEMENT WITH ULTRASOUND AND FLUOROSCOPIC GUIDANCE MEDICATIONS: 2 g Ancef. The  antibiotic was given in an appropriate time interval  prior to skin puncture. ANESTHESIA/SEDATION: Moderate (conscious) sedation was employed during this procedure. A total of Versed 1 mg and Fentanyl 25 mcg was administered intravenously. Moderate Sedation Time: 11 minutes. The patient's level of consciousness and vital signs were monitored continuously by radiology nursing throughout the procedure under my direct supervision. FLUOROSCOPY TIME:  Radiation exposure index: 1 mGy reference air kerma COMPLICATIONS: None immediate. PROCEDURE: Informed written consent was obtained from the patient after a discussion of the risks, benefits, and alternatives to treatment. Questions regarding the procedure were encouraged and answered. The right neck and chest were prepped with chlorhexidine in a sterile fashion, and a sterile drape was applied covering the operative field. Maximum barrier sterile technique with sterile gowns and gloves were used for the procedure. A timeout was performed prior to the initiation of the procedure. After creating a small venotomy incision, a micropuncture kit was utilized to access the right internal jugular vein under direct, real-time ultrasound guidance after the overlying soft tissues were anesthetized with 1% lidocaine with epinephrine. Ultrasound image documentation was performed. The microwire was kinked to measure appropriate catheter length. A stiff Glidewire was advanced to the level of the IVC and the micropuncture sheath was exchanged for a peel-away sheath. A palindrome tunneled hemodialysis catheter measuring 19 cm from tip to cuff was tunneled in a retrograde fashion from the anterior chest wall to the venotomy incision. The catheter was then placed through the peel-away sheath with tips ultimately positioned within the superior aspect of the right atrium. Final catheter positioning was confirmed and documented with a spot radiographic image. The catheter aspirates and flushes normally. The catheter was flushed with appropriate  volume heparin dwells. The catheter exit site was secured with a 0-Prolene retention suture. The venotomy incision was closed with an interrupted 4-0 Vicryl, Dermabond and Steri-strips. Dressings were applied. The patient tolerated the procedure well without immediate post procedural complication. IMPRESSION: Successful placement of 19 cm tip to cuff tunneled hemodialysis catheter via the right internal jugular vein with tips terminating within the superior aspect of the right atrium. The catheter is ready for immediate use. Electronically Signed   By: Jacqulynn Cadet M.D.   On: 05/24/2022 12:39   IR US Guide Vasc Access Right  Result Date: 05/24/2022 INDICATION: 72 year old female in need of durable dialysis access. She presents for removal temporary dialysis catheter in placement of a tunneled dialysis catheter. EXAM: TUNNELED CENTRAL VENOUS HEMODIALYSIS CATHETER PLACEMENT WITH ULTRASOUND AND FLUOROSCOPIC GUIDANCE MEDICATIONS: 2 g Ancef. The antibiotic was given in an appropriate time interval prior to skin puncture. ANESTHESIA/SEDATION: Moderate (conscious) sedation was employed during this procedure. A total of Versed 1 mg and Fentanyl 25 mcg was administered intravenously. Moderate Sedation Time: 11 minutes. The patient's level of consciousness and vital signs were monitored continuously by radiology nursing throughout the procedure under my direct supervision. FLUOROSCOPY TIME:  Radiation exposure index: 1 mGy reference air kerma COMPLICATIONS: None immediate. PROCEDURE: Informed written consent was obtained from the patient after a discussion of the risks, benefits, and alternatives to treatment. Questions regarding the procedure were encouraged and answered. The right neck and chest were prepped with chlorhexidine in a sterile fashion, and a sterile drape was applied covering the operative field. Maximum barrier sterile technique with sterile gowns and gloves were used for the procedure. A timeout was  performed prior to the initiation of the procedure. After creating a small venotomy incision, a micropuncture kit was utilized to  access the right internal jugular vein under direct, real-time ultrasound guidance after the overlying soft tissues were anesthetized with 1% lidocaine with epinephrine. Ultrasound image documentation was performed. The microwire was kinked to measure appropriate catheter length. A stiff Glidewire was advanced to the level of the IVC and the micropuncture sheath was exchanged for a peel-away sheath. A palindrome tunneled hemodialysis catheter measuring 19 cm from tip to cuff was tunneled in a retrograde fashion from the anterior chest wall to the venotomy incision. The catheter was then placed through the peel-away sheath with tips ultimately positioned within the superior aspect of the right atrium. Final catheter positioning was confirmed and documented with a spot radiographic image. The catheter aspirates and flushes normally. The catheter was flushed with appropriate volume heparin dwells. The catheter exit site was secured with a 0-Prolene retention suture. The venotomy incision was closed with an interrupted 4-0 Vicryl, Dermabond and Steri-strips. Dressings were applied. The patient tolerated the procedure well without immediate post procedural complication. IMPRESSION: Successful placement of 19 cm tip to cuff tunneled hemodialysis catheter via the right internal jugular vein with tips terminating within the superior aspect of the right atrium. The catheter is ready for immediate use. Electronically Signed   By: Jacqulynn Cadet M.D.   On: 05/24/2022 12:39   DG CHEST PORT 1 VIEW  Result Date: 05/22/2022 CLINICAL DATA:  Evaluate for intra-abdominal free air. EXAM: PORTABLE CHEST 1 VIEW COMPARISON:  Abdominal radiograph 05/22/2022 FINDINGS: Central venous catheter tip projects over the superior vena cava. Stable cardiac and mediastinal contours. Small layering right pleural  effusion with underlying consolidation. No pneumothorax. No definite free air no definite gas identified underlying the hemidiaphragms. IMPRESSION: 1. Small layering right pleural effusion with underlying consolidation. 2. No definite free air identified underlying the hemidiaphragms. Consider further evaluation with decubitus imaging of the abdomen if there is persistent clinical concern given the limitations of this portable semi-erect chest radiograph. Electronically Signed   By: Lovey Newcomer M.D.   On: 05/22/2022 16:02   DG Abd 1 View  Result Date: 05/22/2022 CLINICAL DATA:  Postoperative abdominal pain. EXAM: ABDOMEN - 1 VIEW COMPARISON:  None Available. FINDINGS: Gas is demonstrated within nondilated loops of large and small bowel in a nonobstructed pattern. Supine evaluation limited for the detection of free intraperitoneal air. Lumbar spine degenerative changes. Calcific density projecting over the pelvis, nonspecific, potentially calcified uterine fibroid. IMPRESSION: Nonobstructed bowel-gas pattern. Electronically Signed   By: Lovey Newcomer M.D.   On: 05/22/2022 14:08   DG CHEST PORT 1 VIEW  Result Date: 05/21/2022 CLINICAL DATA:  Leukocytosis EXAM: PORTABLE CHEST 1 VIEW COMPARISON:  05/18/2022 FINDINGS: Central venous line tip in distal SVC. Normal cardiac silhouette. Small RIGHT effusion. Mild venous congestion. No pneumothorax. No focal consolidation. Lymphadenectomy clips in the LEFT axilla. No significant interval change. IMPRESSION: 1. No significant interval change. 2. Small RIGHT effusion. 3. Mild venous congestion. Electronically Signed   By: Suzy Bouchard M.D.   On: 05/21/2022 08:22   US BIOPSY (KIDNEY)  Result Date: 05/19/2022 INDICATION: 72 year old female with history of end-stage renal disease. EXAM: ULTRASOUND GUIDED RENAL BIOPSY COMPARISON:  None Available. MEDICATIONS: None. ANESTHESIA/SEDATION: Fentanyl 25 mcg IV; Versed 0.5 mg IV Total Moderate Sedation time: 10 minutes; The  patient was continuously monitored during the procedure by the interventional radiology nurse under my direct supervision. COMPLICATIONS: None immediate. PROCEDURE: Informed written consent was obtained from the patient after a discussion of the risks, benefits and alternatives to treatment. The patient understands  and consents the procedure. A timeout was performed prior to the initiation of the procedure. Ultrasound scanning was performed of the bilateral flanks. The inferior pole of the left kidney was selected for biopsy due to location and sonographic window. The procedure was planned. The operative site was prepped and draped in the usual sterile fashion. The overlying soft tissues were anesthetized with 1% lidocaine with epinephrine. A 17 gauge coaxial introducer needle was advanced into the inferior cortex of the left kidney and 2 core biopsies were obtained under direct ultrasound guidance with a 16 gauge core biopsy device. Images were saved for documentation purposes. The biopsy device was removed and hemostasis was obtained with manual compression after injection of Gel-Foam slurry along the needle track under ultrasound guidance. Post procedural scanning was negative for significant post procedural hemorrhage or additional complication. A dressing was placed. The patient tolerated the procedure well without immediate post procedural complication. IMPRESSION: Technically successful ultrasound guided left renal biopsy. Ruthann Cancer, MD [facility] Electronically Signed   By: Ruthann Cancer M.D.   On: 05/19/2022 14:19   DG Chest Port 1 View  Result Date: 05/18/2022 CLINICAL DATA:  Central line placement. EXAM: PORTABLE CHEST 1 VIEW COMPARISON:  CT chest and chest x-ray from 2 days ago. FINDINGS: New right internal jugular central venous catheter with tip at the cavoatrial junction. The heart size and mediastinal contours are within normal  limits. Mild diffuse interstitial thickening similar. Decreasing small right and unchanged small left pleural effusions. Increasing asymmetric density in the peripheral left upper lobe. Similar atelectasis in the peripheral right middle lobe. Improving right lower lobe atelectasis. No pneumothorax. No acute osseous abnormality. IMPRESSION: 1. New right internal jugular central venous catheter with tip at the cavoatrial junction. No pneumothorax. 2. Similar diffuse interstitial thickening with increasing asymmetric density in the peripheral left upper lobe. Differential considerations include infection or pulmonary edema. 3. Decreased small right and unchanged small left pleural effusions. Electronically Signed   By: Titus Dubin M.D.   On: 05/18/2022 14:06   US RENAL  Result Date: 05/18/2022 CLINICAL DATA:  Chronic kidney disease EXAM: RENAL / URINARY TRACT ULTRASOUND COMPLETE COMPARISON:  Abdomen CT report 01/13/2018 FINDINGS: Right Kidney: Renal measurements: 8.9 x 4.7 x 4.4 = volume: 97 mL. Kidney appears echogenic. No collecting system dilatation. There is a simple cyst along the midportion of the kidney which is anechoic with through transmission measuring 17 mm. Left Kidney: Renal measurements: 7.5 x 3.9 x 3.2 = volume: 49 mL. Small echogenic appearing kidney. No collecting system dilatation. Anechoic rounded structure seen in the mid kidney measuring 2 cm. Through transmission is smoothly marginated. Bladder: Appears normal for degree of bladder distention. Other: Scattered ascites.  Probable right-sided pleural effusion. IMPRESSION: Echogenic small appearing kidneys. No collecting system dilatation. Bilateral simple appearing renal cysts. Ascites.  Right pleural effusion Electronically Signed   By: Jill Side M.D.   On: 05/18/2022 11:07   CT Chest Wo Contrast  Result Date: 05/16/2022 CLINICAL DATA:  72 year old female with history of suspected pneumonia. EXAM: CT CHEST WITHOUT CONTRAST  TECHNIQUE: Multidetector CT imaging of the chest was performed following the standard protocol without IV contrast. RADIATION DOSE REDUCTION: This exam was performed according to the departmental dose-optimization program which includes automated exposure control, adjustment of the mA and/or kV according to patient size and/or use of iterative reconstruction technique. COMPARISON:  Chest x-ray 05/16/2022. FINDINGS: Cardiovascular: Heart size is normal. There is no significant pericardial fluid,  thickening or pericardial calcification. Aortic atherosclerosis. No definite coronary artery calcifications. Mild calcifications of the aortic valve. Mediastinum/Nodes: No pathologically enlarged mediastinal or hilar lymph nodes. Please note that accurate exclusion of hilar adenopathy is limited on noncontrast CT scans. Esophagus is unremarkable in appearance. No axillary lymphadenopathy. Lungs/Pleura: Moderate to large right and small to moderate left pleural effusions lying dependently associated with areas of passive subsegmental atelectasis in the lower lobes of the lungs bilaterally. In addition, there are mid to upper lung predominant patchy areas of ground-glass attenuation and nodular appearing consolidative airspace disease, likely to reflect multilobar bilateral bronchopneumonia. The largest of these nodular appearing regions in the apex of the left upper lobe (axial image 32 of series 4) measuring 1.3 x 1.2 cm. Upper Abdomen: Aortic atherosclerosis. Tiny partially calcified gallstones lying dependently in the fundus of the gallbladder. Small volume of ascites. Multiple renal lesions bilaterally, incompletely characterized on today's noncontrast CT examination, most notable for an exophytic intermediate attenuation lesion (25 HU) measuring 1.9 cm in the anterior aspect of the interpolar region of the left kidney. A cluster of nonobstructive calculi is also noted in the posterior aspect of the upper pole collecting  system of the right kidney measuring up to 3 mm. Musculoskeletal: There are no aggressive appearing lytic or blastic lesions noted in the visualized portions of the skeleton. IMPRESSION: 1. The appearance the chest is most suggestive of multilobar bilateral bronchopneumonia with bilateral parapneumonic pleural effusions, as above. 2. Given the nodular appearance of some of the areas of apparent airspace consolidation, follow-up noncontrast chest CT is recommended in 3 months to ensure resolution of the nodular areas following treatment for the patient's acute illness, to exclude underlying neoplasm. 3. Multiple renal lesions bilaterally, incompletely characterized on today's noncontrast CT examination, including an indeterminate 1.9 cm lesion in the anterior aspect of the interpolar region of the left kidney. Follow-up nonemergent outpatient abdominal MRI with and without IV gadolinium is recommended in the near future to definitively evaluate this lesion and exclude neoplasm. 4. Cholelithiasis. 5. Nonobstructive calculi in the right renal collecting system measuring up to 3 mm. 6. Ascites. 7. Aortic atherosclerosis. Aortic Atherosclerosis (ICD10-I70.0). Electronically Signed   By: Vinnie Langton M.D.   On: 05/16/2022 05:23   DG Chest Port 1 View  Result Date: 05/16/2022 CLINICAL DATA:  Shortness of breath EXAM: PORTABLE CHEST 1 VIEW COMPARISON:  05/07/2022 FINDINGS: Cardiac shadow is enlarged but stable. Small pleural effusion on the right is again noted. Some increased basilar density is noted on the right when compared with the prior study. Postsurgical changes in the left breast are seen. No bony abnormality is noted. IMPRESSION: Persistent right-sided effusion with slight increase in basilar infiltrate on the right. Electronically Signed   By: Inez Catalina M.D.   On: 05/16/2022 03:45  [2 weeks]  Assessment:   72 year old lady with history of COPD, RA, CKD stage IV who was admitted with pneumonia and  worsening renal function May 16, 2022 after recent discharge from outside hospital May 12, 2022.  GI consulted for further evaluation of worsening anemia and rectal bleeding.  Acute blood loss anemia/perforated duodenal ulcer: In the setting of PUD with 2 nonbleeding cratered gastric ulcers, multiple duodenal ulcers, 2 of which had adherent clots that were removed and revealed 2 vessels.  1 vessel was clipped, cauterized, and treated with epinephrine injection.  Second vessel was clipped and treated w/ cautery, clip fell off.  She later developed epigastric abdominal pain, found to have perforated duodenal  ulcer, underwent emergent exploratory laparotomy with graham patch and JP drain placement on 1/26. Had received 7 units PRBCs this admission and hemoglobin declined as low as 5.5 January 30, up to 11 today/12.5 on repeat s/p 2 units, query incorrect value of 5.5. Has received total of 9 unites of prbcs.    She reported some Melena January 30 through the night.  Had a loose black/green stool today reported. Patient feels stools are lightening up. JP drain with serous appearing fluid.   Recommend repeat CT angiography if she has any overt GI bleeding. Surgery saw patient in follow up yesterday, UGI series today was unremarkable for any leaks, recommended removing NG tube and clear liquid diet.    Etiology of PUD unclear, will continue PPI BID and plan for empiric H pylori treatment outpatient.  fasting gastrin level pending.   Elevated LFTs: New findings today. Possibly related to TPN.      Plan:   PPI twice daily for 70-month F/u gastrin level. No NSAIDs. Empiric treatment for H. Pylori as outpatient. EGD later this year. We will follow peripherally.     LOS: 18 days   LLaureen Ochs LBernarda CaffeyRBerkshire Eye LLCGastroenterology Associates [phone]/1/20248:00 AM

## 2022-06-03 NOTE — TOC Progression Note (Addendum)
Transition of Care [facility]) - Progression Note    Patient Details  Name: KSENIA KUNZ MRN: [id number] Date of Birth: 1950/08/12  Transition of Care [facility]) CM/SW Contact  Shade Flood, LCSW Phone Number: 06/03/2022, 2:44 PM  Clinical Narrative:     TOC following. MD anticipating possible dc Saturday.   Met with pt at bedside today to present bed offers again. CMS star rating list given to pt. Pt selected [facility]. Will update insurance atuh. HIPAA compliant message left with [facility] admissions to update. Updated [facility] at [facility].  Will follow up in AM.  Expected Discharge Plan: [place] Barriers to Discharge: Continued Medical Work up  Expected Discharge Plan and Services In-house Referral: Clinical Social Work   Post Acute Care Choice: [place] Living arrangements for the past 2 months: Apartment                                       Social Determinants of Health (SDOH) Interventions SDOH Screenings   Food Insecurity: No Food Insecurity (05/19/2022)  Housing: Low Risk  (05/19/2022)  Transportation Needs: No Transportation Needs (05/19/2022)  Utilities: Not At Risk (05/19/2022)  Tobacco Use: Medium Risk (06/01/2022)    Readmission Risk Interventions    05/18/2022   11:51 AM  Readmission Risk Prevention Plan  Transportation Screening Complete  PCP or Specialist Appt within 5-7 Days Not Complete  Home Care Screening Complete  Medication Review (RN CM) Complete

## 2022-06-03 NOTE — Progress Notes (Signed)
PHARMACY - TOTAL PARENTERAL NUTRITION CONSULT NOTE   Indication:  perforate ulcer, unable to meet nutritional needs orally/enterally  Patient Measurements: Height: '4\' 11"'$  (149.9 cm) Weight: 56.1 kg (123 lb 10.9 oz) IBW/kg (Calculated) : 43.2 TPN AdjBW (KG): 46.7 Body mass index is 24.98 kg/m. Usual Weight: 57kg  Assessment: 72 year old female originally admitted with pneumonia and AKI on 1/14. Admission complicated with anemia requiring colonoscopy and EGG. Pain returned 1/25, intraperitoneal gas and fluid on CT 1/25 concerning for perforation. Patient went for emergent exlap overnight and omental patch repair was done. Patient will remain npo and likely need TPN for a week per surgery. UGI study planned for 1/31.    Glucose / Insulin: CBGs 127-187, 4 units of insulin/24 hours. Patient continues on solu-medrol- 30 mg daily   Electrolytes: CoCa = 8.6, Po4 2.8, Mg 1.8 potassium 3.5 Na 132 Renal: HD MWF Hepatic: TG 146 Intake / Output; MIVF: inaccurate, no MIVF 600 ml from IV Zyvox and 100 mL from IV fluconazole every 24 hours.- will monitor   GI Imaging: ABD [place] 1/23 CT 1/25 GI Surgeries / Procedures:  Colonoscopy 1/20 EGD 1/20 Emergent Ex-lap with omental patch 1/26  Central access: CVC placed 1/26 TPN start date: 1/26  Nutritional Goals: Goal TPN rate is 80 mL/hr (provides 92 g of protein and 1812 kcals per day) >>> concentrating to 60 mL/hr  RD Assessment: Estimated Needs Total Energy Estimated Needs: 1800-1900 Total Protein Estimated Needs: 90-98 gr Total Fluid Estimated Needs: 1000 ml plus urine output  Current Nutrition:  Started on clear liquids  Plan:  Concentrate TPN since patient is ESRD on HD Continue TPN at 60 ml/hr using clinasol 15% Electrolytes in TPN: Na 135mq/L, K 40 mEq/L, Ca 542m/L, Mg 4 mEq/L, and Phos 5 mmol/L. Cl:Ac 1:2 Add standard MVI, folic acid, and trace elements to TPN, chromium removed due to renal disease Add 12 units insulin  Continue  Sensitive q8h SSI and adjust as needed  Monitor TPN labs on Mon/Thurs  StMargot AblesPharmD Clinical Pharmacist 06/03/2022 8:40 AM

## 2022-06-03 NOTE — Progress Notes (Signed)
Patient ID: Sheri Cooper, female   DOB: [birth year], 71 y.o.   MRN: [id number] [facility] Progress Note   Assessment/ Plan:   1. 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  She has been on hemodialysis since 05/18/2022 for severe azotemia with development of uremic symptoms.  Started on treatment with corticosteroids.   --------------------- - currently on Solu-Medrol 40 mg daily.  I lowered this dose to solumedrol 30 mg daily given her sepsis and GI bleed.  Note plans to transition this to oral prednisone 60 mg daily thereafter (taper as outpatient under the supervision of Dr. Theador Hawthorne while being monitored on hemodialysis).   - We will plan on holding dialysis on 2/2 unless clinical status changes.  - I have re-ordered strict ins/outs - Note that she had previously been accepted to an outpatient hemodialysis unit-[facility].  We are now actively watching her off of dialysis and I am hopeful that she might not need it further  2. CKD stage 3b - Note that she follows with Dr. Theador Hawthorne as an outpatient   3.  Healthcare associated pneumonia: antibiotics per primary team   4.  Anemia acute blood loss: Likely secondary to chronic illness and recent GI bleed for which she had emergency exploratory laparotomy with omental patch for perforated DU.  Recommendations per gastroenterology for empiric H. pylori treatment noted. Transfuse per primary team. TSAT was 40% 05/16/22. On aranesp 200 mcg every Tuesday.  Note CT angio ordered for GI bleed but later cancelled  5.  Sepsis: Secondary to urinary tract infection and peritonitis from ruptured duodenal ulcer requiring emergent exploratory laparotomy.  Abx per primary team.  Transiently in ICU for pressors/intubated and now weaned off of pressors  6. HTN  - Would start amlodipine 5 mg daily once  acceptable for PO meds  7. Hypokalemia - stable  Disposition - please continue inpatient monitoring.  We are hoping to get her off of dialysis and holding the 2/2 treatment as above   Subjective:   Last HD on 1/31 with 1.2 kg UF.  Strict ins/outs are not available.  She had two unmeasured urine voids over 1/31.  She feels ok.  Has continued on TPN.  She is excited to hear about kidney function getting better.   Review of systems:  Denies shortness of breath  She has started on clears - going ok but taking it slow  Denies n/v    Objective:   BP (!) 174/68 (BP Location: Right Arm)   Pulse 92   Temp 98.7 F (37.1 C)   Resp 18   Ht '4\' 11"'$  (1.499 m)   Wt 56.1 kg   SpO2 96%   BMI 24.98 kg/m   Intake/Output Summary (Last 24 hours) at 06/03/2022 1048 Last data filed at 06/03/2022 0600 Gross per 24 hour  Intake 1054.52 ml  Output 431.2 ml  Net 623.32 ml   Weight change: -4.7 kg  Physical Exam:   General adult female in bed in no acute distress HEENT normocephalic atraumatic extraocular movements intact sclera anicteric Neck supple trachea midline Lungs clear to auscultation bilaterally normal work of breathing at rest; on room air Heart S1S2 no rub Abdomen soft nontender nondistended Extremities trace edema  Psych normal mood and affect Neuro alert and oriented x 3 provides hx and follows commands  Access RIJ tunn catheter in place  Imaging: DG UGI W SMALL BOWEL  Result Date: 06/02/2022 CLINICAL DATA:  Post repair of perforated ulcer EXAM: WATER SOLUBLE UPPER GI SERIES TECHNIQUE: Single-column upper GI series was performed using water soluble contrast. Radiation Exposure Index (as provided by the fluoroscopic device): 23.1 mGy Kerma CONTRAST:  100 cc Omnipaque 300 IV COMPARISON:  None available FLUOROSCOPY: Fluoroscopy Time:  1 minute 24 seconds Radiation Exposure Index (if provided by the fluoroscopic device): 23.1 mGy Number of Acquired Spot Images: Multiple spots FINDINGS:  Normal gastric distension. Wall thickening at the rugal folds from mid stomach through antrum. Additional wall thickening of the pylorus and duodenal bulb. No contrast extravasation identified to suggest postoperative leak. Contrast opacifies the duodenal sweep with normal position of ligament of Treitz. No extraluminal contrast identified nor evidence of contrast opacification of the indwelling JP drain. IMPRESSION: Wall thickening of mid stomach throughout duodenal bulb consistent with edema related to prior ulcer and subsequent surgery. No evidence of postoperative leak. Electronically Signed   By: Lavonia Dana M.D.   On: 06/02/2022 11:42    Labs: BMET Recent Labs  Lab 05/29/22 0508 05/30/22 0500 05/31/22 0515 06/01/22 0404 06/02/22 0407 06/03/22 0355 06/03/22 0356  NA 135 135 132* 132* 132*  130* 130* 132*  K 4.5 4.6 5.4* 3.7 3.4*  3.4* 3.4* 3.5  CL 100 99 100 98 99  97* 97* 97*  CO2 '24 25 22 26 23  '$ 20* 26 26  GLUCOSE 141* 146* 160* 194* 134*  133* 140* 140*  BUN 42* 60* 87* 53* 79*  78* 57* 56*  CREATININE 2.55* 1.88* 2.34* 1.70* 2.13*  2.20* 1.68* 1.72*  CALCIUM 7.3* 6.7* 7.3*  7.0* 6.8* 7.1*  7.0* 6.9* 7.1*  PHOS 3.2 2.3* 3.8  3.7 3.9 4.7* 2.8 2.8   CBC Recent Labs  Lab 05/28/22 0714 05/28/22 1208 05/30/22 0751 05/30/22 1117 06/01/22 0404 06/01/22 0700 06/02/22 0407 06/02/22 1110  WBC 18.6*  --  17.8*  --  14.1*  --  13.4*  --   HGB 10.5*   < > 7.7*   < > 6.4* 5.5* 11.0* 12.5  HCT 31.7*   < > 24.5*   < > 20.6* 17.4* 34.6* 38.3  MCV 81.1  --  75.9*  --  76.6*  --  80.7  --   PLT 147*  --  132*  --  126*  --  141*  --    < > = values in this interval not displayed.    Medications:     sodium chloride   Intravenous Once   Chlorhexidine Gluconate Cloth  6 each Topical Daily   Chlorhexidine Gluconate Cloth  6 each Topical Q0600   darbepoetin (ARANESP) injection - DIALYSIS  200 mcg Subcutaneous Q Tue-1800   hydrALAZINE  5 mg Intravenous Q8H   insulin aspart   0-9 Units Subcutaneous Q8H   methylPREDNISolone (SOLU-MEDROL) injection  30 mg Intravenous Daily   pantoprazole  40 mg Intravenous Q12H    Claudia Desanctis, MD  06/03/2022, 2:54 PM

## 2022-06-03 NOTE — Progress Notes (Signed)
[facility]  Doing well with the clears. Does not want more. Having Bms.   BP (!) 174/68 (BP Location: Right Arm)   Pulse 92   Temp 98.7 F (37.1 C)   Resp 18   Ht '4\' 11"'$  (1.499 m)   Wt 56.1 kg   SpO2 96%   BMI 24.98 kg/m  JP with serous fluid Midline c/d/I with staples  Patient s/p perforated duodenal ulcer s/p patch and drain. UGI negative. Started diet. Can have full liquids but wants to stay on clears today. Will do order for adv as tolerated. JP to remain in place, output  going down, will remove before dc hopefully  Curlene Labrum, MD [facility] [address] [place], [place] [address] [phone] (office)

## 2022-06-03 NOTE — Progress Notes (Signed)
Physical Therapy Treatment Patient Details Name: Sheri Cooper MRN: [id number] DOB: [birth year] Today's Date: 06/03/2022   History of Present Illness Sheri Cooper is a 72 y.o. female with medical history significant of COPD, rheumatoid arthritis, stage IV CKD who presents to the emergency department due to ongoing and worsening shortness of breath which has been ongoing since being discharged from [facility] on 1/10.  Patient was admitted and discharged from [facility] from 1/5 through 05/12/2022 due to community-acquired pneumonia and COPD exacerbation as well as acute kidney injury.  She received antibiotics (ceftriaxone and azithromycin) for pneumonia and she did receive nebulizer treatment and was transferred to [facility] on oxygen prior to being discharged.  Patient was supposed to follow-up with a neurologist [facility]) after the discharge, but she has not been able to do these due to ongoing shortness of breath.  Patient states that she has been at her brother's house since discharge and that the house was very cold.  She states that she has been retaining fluids since she left the hospital and that shortness of breath has worsened.  EMS was activated and patient was taken to the ED for further evaluation and management.    PT Comments    Patient demonstrates slow labored movement for sitting up at bedside with fair/good return for scooting to EOB after verbal/tactile cueing.  Patient demonstrates fair return for completing BLE ROM/strengthening exercises while seated at EOB with verbal cueing, limited to taking a few side steps at bedside due to BLE weakness with buckling of knees and fall risk.  Patient tolerated sitting up in chair after therapy with nursing staff present in room.  Patient will benefit from continued skilled physical therapy in hospital and recommended venue below to increase strength, balance, endurance for safe ADLs and gait.       Recommendations for  follow up therapy are one component of a multi-disciplinary discharge planning process, led by the attending physician.  Recommendations may be updated based on patient status, additional functional criteria and insurance authorization.  Follow Up Recommendations  Skilled nursing-short term rehab (<3 hours/day) Can patient physically be transported by private vehicle: No   Assistance Recommended at Discharge Set up Supervision/Assistance  Patient can return home with the following A lot of help with bathing/dressing/bathroom;A lot of help with walking and/or transfers;Assistance with cooking/housework;Help with stairs or ramp for entrance   Equipment Recommendations  Rolling walker (2 wheels)    Recommendations for Other Services       Precautions / Restrictions Precautions Precautions: Fall Restrictions Weight Bearing Restrictions: No     Mobility  Bed Mobility Overal bed mobility: Needs Assistance Bed Mobility: Supine to Sit     Supine to sit: Min assist, Mod assist     General bed mobility comments: increased time, labored movement with c/o increasing right side lower abdominal pain at drain site    Transfers Overall transfer level: Needs assistance Equipment used: Rolling walker (2 wheels) Transfers: Sit to/from Stand, Bed to chair/wheelchair/BSC Sit to Stand: Mod assist   Step pivot transfers: Mod assist       General transfer comment: slow labored unsteady movement    Ambulation/Gait Ambulation/Gait assistance: Mod assist, Max assist Gait Distance (Feet): 3 Feet Assistive device: Rolling walker (2 wheels) Gait Pattern/deviations: Decreased step length - right, Decreased step length - left, Decreased stride length, Trunk flexed, Shuffle Gait velocity: slow     General Gait Details: limited to a few slow labored side steps before having  to sit due to fatigue/generalized weakness   Stairs             Wheelchair Mobility    Modified Rankin  (Stroke Patients Only)       Balance Overall balance assessment: Needs assistance Sitting-balance support: Feet supported, No upper extremity supported Sitting balance-Leahy Scale: Fair Sitting balance - Comments: fair/good seated at EOB   Standing balance support: Reliant on assistive device for balance, During functional activity, Bilateral upper extremity supported Standing balance-Leahy Scale: Poor Standing balance comment: using RW                            Cognition Arousal/Alertness: Awake/alert Behavior During Therapy: WFL for tasks assessed/performed Overall Cognitive Status: Within Functional Limits for tasks assessed                                          Exercises General Exercises - Lower Extremity Long Arc Quad: Seated, AROM, Strengthening, Both, 10 reps Hip Flexion/Marching: Seated, AROM, Strengthening, Both, 10 reps Toe Raises: Seated, AROM, Strengthening, Both, 10 reps Heel Raises: Seated, AROM, Strengthening, Both, 10 reps    General Comments        Pertinent Vitals/Pain Pain Assessment Pain Assessment: Faces Faces Pain Scale: Hurts little more Pain Location: right side of stomach at drain insertion Pain Descriptors / Indicators: Sore, Grimacing, Guarding Pain Intervention(s): Limited activity within patient's tolerance, Monitored during session, Repositioned    Home Living                          Prior Function            PT Goals (current goals can now be found in the care plan section) Acute Rehab PT Goals Patient Stated Goal: return home after rehab PT Goal Formulation: With patient/family Time For Goal Achievement: 06/14/22 Potential to Achieve Goals: Good Progress towards PT goals: Progressing toward goals    Frequency    Min 3X/week      PT Plan Current plan remains appropriate    Co-evaluation              AM-PAC PT "6 Clicks" Mobility   Outcome Measure  Help needed turning  from your back to your side while in a flat bed without using bedrails?: A Lot Help needed moving from lying on your back to sitting on the side of a flat bed without using bedrails?: A Lot Help needed moving to and from a bed to a chair (including a wheelchair)?: A Lot Help needed standing up from a chair using your arms (e.g., wheelchair or bedside chair)?: A Lot Help needed to walk in hospital room?: A Lot Help needed climbing 3-5 steps with a railing? : Total 6 Click Score: 11    End of Session   Activity Tolerance: Patient tolerated treatment well;Patient limited by fatigue Patient left: in chair;with call bell/phone within reach;with nursing/sitter in room Nurse Communication: Mobility status PT Visit Diagnosis: Unsteadiness on feet (R26.81);Other abnormalities of gait and mobility (R26.89);Muscle weakness (generalized) (M62.81)     Time: 8921-1941 PT Time Calculation (min) (ACUTE ONLY): 28 min  Charges:  $Therapeutic Exercise: 8-22 mins $Therapeutic Activity: 8-22 mins                     12:38 PM, 06/03/22 Jeneen Rinks  Chrissie Noa, MPT Physical Therapist with [facility] 336 [phone] office [phone] mobile phone

## 2022-06-04 DIAGNOSIS — K922 Gastrointestinal hemorrhage, unspecified: Secondary | ICD-10-CM | POA: Diagnosis not present

## 2022-06-04 DIAGNOSIS — K659 Peritonitis, unspecified: Secondary | ICD-10-CM | POA: Diagnosis not present

## 2022-06-04 DIAGNOSIS — D62 Acute posthemorrhagic anemia: Secondary | ICD-10-CM | POA: Diagnosis not present

## 2022-06-04 DIAGNOSIS — J189 Pneumonia, unspecified organism: Secondary | ICD-10-CM | POA: Diagnosis not present

## 2022-06-04 LAB — GLUCOSE, CAPILLARY
Glucose-Capillary: 110 mg/dL — ABNORMAL HIGH (ref 70–99)
Glucose-Capillary: 150 mg/dL — ABNORMAL HIGH (ref 70–99)
Glucose-Capillary: 89 mg/dL (ref 70–99)

## 2022-06-04 LAB — RENAL FUNCTION PANEL
Albumin: 1.9 g/dL — ABNORMAL LOW (ref 3.5–5.0)
Anion gap: 5 (ref 5–15)
BUN: 72 mg/dL — ABNORMAL HIGH (ref 8–23)
CO2: 26 mmol/L (ref 22–32)
Calcium: 6.9 mg/dL — ABNORMAL LOW (ref 8.9–10.3)
Chloride: 99 mmol/L (ref 98–111)
Creatinine, Ser: 2.31 mg/dL — ABNORMAL HIGH (ref 0.44–1.00)
GFR, Estimated: 22 mL/min — ABNORMAL LOW (ref >60)
Glucose, Bld: 71 mg/dL (ref 70–99)
Phosphorus: 3.5 mg/dL (ref 2.5–4.6)
Potassium: 3.9 mmol/L (ref 3.5–5.1)
Sodium: 130 mmol/L — ABNORMAL LOW (ref 135–145)

## 2022-06-04 LAB — CBC
HCT: 32.1 % — ABNORMAL LOW (ref 36.0–46.0)
Hemoglobin: 10.1 g/dL — ABNORMAL LOW (ref 12.0–15.0)
MCH: 25.8 pg — ABNORMAL LOW (ref 26.0–34.0)
MCHC: 31.5 g/dL (ref 30.0–36.0)
MCV: 81.9 fL (ref 80.0–100.0)
Platelets: 141 10*3/uL — ABNORMAL LOW (ref 150–400)
RBC: 3.92 MIL/uL (ref 3.87–5.11)
RDW: 22.2 % — ABNORMAL HIGH (ref 11.5–15.5)
WBC: 10 10*3/uL (ref 4.0–10.5)
nRBC: 0 % (ref 0.0–0.2)

## 2022-06-04 LAB — GASTRIN: Gastrin: 49 pg/mL (ref 0–115)

## 2022-06-04 MED ORDER — AMLODIPINE BESYLATE 5 MG PO TABS
5.0000 mg | ORAL_TABLET | Freq: Every day | ORAL | Status: DC
  Administered 2022-06-04 – 2022-06-05 (×2): 5 mg via ORAL
  Filled 2022-06-04 (×2): qty 1

## 2022-06-04 MED ORDER — FUROSEMIDE 10 MG/ML IJ SOLN
80.0000 mg | Freq: Once | INTRAMUSCULAR | Status: AC
  Administered 2022-06-04: 80 mg via INTRAVENOUS
  Filled 2022-06-04: qty 8

## 2022-06-04 MED ORDER — DARBEPOETIN ALFA 150 MCG/0.3ML IJ SOSY: 150.0000 ug | PREFILLED_SYRINGE | INTRAMUSCULAR | Status: DC

## 2022-06-04 NOTE — Progress Notes (Addendum)
Patient ID: Sheri Cooper, female   DOB: 11/07/1950, 72 y.o.   MRN: [id number] [facility] Progress Note   Assessment/ Plan:   1. 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  She has been on hemodialysis since 05/18/2022 for severe azotemia with development of uremic symptoms.  Started on treatment with corticosteroids.   --------------------- - Lowered to solumedrol 30 mg daily given her sepsis and GI bleed.  Note plans to transition this to oral prednisone 60 mg daily thereafter (taper as outpatient under the supervision of Dr. Theador Hawthorne) - We will plan on holding dialysis on 2/2 and monitoring for further dialysis needs.  She may need again on 2/5 depending on her labs - strict ins/outs - Note that she had previously been accepted to an outpatient hemodialysis unit-[facility].  We are now actively watching her off of dialysis and I am hopeful that she might not need it further - Lasix 80 mg IV once - wouldn't be aggressive with diuresis   2. CKD stage 3b - Note that she follows with Dr. Theador Hawthorne as an outpatient   3.  Healthcare associated pneumonia: antibiotics per primary team   4.  Anemia acute blood loss: Likely secondary to chronic illness and recent GI bleed for which she had emergency exploratory laparotomy with omental patch for perforated DU.  Recommendations per gastroenterology for empiric H. pylori treatment noted. Transfuse per primary team. TSAT was 40% 05/16/22. Had received aranesp 200 mcg every Tuesday - I am reducing her aranesp to 150 mcg every Tuesday   5.  Sepsis: Secondary to urinary tract infection and peritonitis from ruptured duodenal ulcer requiring emergent exploratory laparotomy.  Abx per primary team.  Transiently in ICU for pressors/intubated and now weaned off of pressors  6. HTN  - Would start amlodipine  5 mg daily once acceptable for PO meds.  (Could always try clonidine patch if she's going to be a while before able to take pills but I see that she has a dysphagia diet ordered so hopefully not needed).  Reached out to primary team   7. Hypokalemia - improved  8. Hypocalcemia  - improves with correction for albumin   Disposition - please continue inpatient monitoring.  We are hoping to get her off of dialysis and holding the 2/2 treatment as above   Subjective:   Last HD on 1/31 with 1.2 kg UF.  She had 500 mL uop over 2/1.  She was just changed to a dysphagia diet.  Her sister is at bedside visiting.  She is excited about holding dialysis today and understands that she may need additional treatments but that we're watching to see what her kidneys can do   Review of systems:   Denies shortness of breath  Denies n/v; had jello yesterday Denies chest pain    Objective:   BP (!) 189/57   Pulse 88   Temp 98.6 F (37 C) (Oral)   Resp 20   Ht '4\' 11"'$  (1.499 m)   Wt 58.3 kg   SpO2 96%   BMI 25.96 kg/m   Intake/Output Summary (Last 24 hours) at 06/04/2022 1108 Last data filed at 06/04/2022 0500 Gross per 24 hour  Intake 949.54 ml  Output 675 ml  Net 274.54 ml   Weight change: 1.4 kg  Physical Exam:    General adult female in bed in  no acute distress HEENT normocephalic atraumatic extraocular movements intact sclera anicteric Neck supple trachea midline Lungs clear to auscultation bilaterally normal work of breathing at rest; on room air Heart S1S2 no rub Abdomen soft nontender nondistended Extremities trace to 1+ edema  Psych normal mood and affect Neuro alert and oriented x 3 provides hx and follows commands  Access RIJ tunn catheter in place  Imaging: No results found.  Labs: BMET Recent Labs  Lab 05/30/22 0500 05/31/22 0515 06/01/22 0404 06/02/22 0407 06/03/22 0355 06/03/22 0356 06/04/22 0354  NA 135 132* 132* 132*  130* 130* 132* 130*  K 4.6 5.4* 3.7 3.4*   3.4* 3.4* 3.5 3.9  CL 99 100 98 99  97* 97* 97* 99  CO2 '25 22 26 23  '$ 20* '26 26 26  '$ GLUCOSE 146* 160* 194* 134*  133* 140* 140* 71  BUN 60* 87* 53* 79*  78* 57* 56* 72*  CREATININE 1.88* 2.34* 1.70* 2.13*  2.20* 1.68* 1.72* 2.31*  CALCIUM 6.7* 7.3*  7.0* 6.8* 7.1*  7.0* 6.9* 7.1* 6.9*  PHOS 2.3* 3.8  3.7 3.9 4.7* 2.8 2.8 3.5   CBC Recent Labs  Lab 05/30/22 0751 05/30/22 1117 06/01/22 0404 06/01/22 0700 06/02/22 0407 06/02/22 1110 06/04/22 0354  WBC 17.8*  --  14.1*  --  13.4*  --  10.0  HGB 7.7*   < > 6.4* 5.5* 11.0* 12.5 10.1*  HCT 24.5*   < > 20.6* 17.4* 34.6* 38.3 32.1*  MCV 75.9*  --  76.6*  --  80.7  --  81.9  PLT 132*  --  126*  --  141*  --  141*   < > = values in this interval not displayed.    Medications:     sodium chloride   Intravenous Once   Chlorhexidine Gluconate Cloth  6 each Topical Daily   Chlorhexidine Gluconate Cloth  6 each Topical Q0600   darbepoetin (ARANESP) injection - DIALYSIS  200 mcg Subcutaneous Q Tue-1800   hydrALAZINE  5 mg Intravenous Q8H   insulin aspart  0-9 Units Subcutaneous Q8H   methylPREDNISolone (SOLU-MEDROL) injection  30 mg Intravenous Daily   nystatin   Topical BID   pantoprazole  40 mg Intravenous Q12H    Claudia Desanctis, MD  06/04/2022, 11:31 AM   Nephrology will monitor her labs over the weekend.  Please do not hesitate to reach out with questions   Claudia Desanctis, MD 11:33 AM 06/04/2022

## 2022-06-04 NOTE — Care Management Important Message (Signed)
Important Message  Patient Details  Name: Sheri Cooper MRN: [id number] Date of Birth: 07/30/1950   Medicare Important Message Given:  Other (see comment) (reviewed by phone at [phone], no additional copy needed)     Tommy Medal 06/04/2022, 3:49 PM

## 2022-06-04 NOTE — Progress Notes (Addendum)
[facility]  Doing well on dysphagia and having Bms. JP Drain with serous fluid but decreasing.   BP (!) 189/57   Pulse 88   Temp 98.6 F (37 C) (Oral)   Resp 20   Ht '4\' 11"'$  (1.499 m)   Wt 58.6 kg   SpO2 96%   BMI 26.09 kg/m  Midline with staples JP with serous fluid   Patient s/p perforated duodenal ulcer s/p patch and drain.   Dysphagia diet for next 2 weeks JP to be removed by Dr. Arnoldo Morale tomorrow.  Will see 2/8 to remove the staples.  Curlene Labrum, MD [facility] [address] [place], [place] [address] [phone] (office)

## 2022-06-04 NOTE — Progress Notes (Signed)
PROGRESS NOTE  Sheri Cooper DGU:[id number] DOB: Mar 15, 1951 DOA: 05/16/2022 PCP: [facility]  Brief History:  72 y.o. female with medical history significant of COPD, rheumatoid arthritis, stage V CKD. Admitted on 05/16/2022 with healthcare associated pneumonia and worsening renal function with anion gap metabolic acidosis.  -Patient was recently discharged from [facility] on 05/12/2022 after treatment for COPD.  Nephrology was consulted.  Her hospitalization was prolonged secondary to HCAP and progressive renal failure.  She finished intravenous cefepime on 05/20/2022.  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  She was started on steroids by nephrology.  Pt was started on hemodialysis on 05/18/22 after temporary HD cath placement and had renal biopsy done on 05/19/22.  On 05/24/2022, tunneled dialysis catheter was placed.  An outpatient dialysis center was ultimately found for the patient.  Her first outpatient dialysis will be scheduled on 06/01/2022. Her hospitalization was prolonged secondary to GI bleed.  She was noted to have melena and hematochezia.  She was transfused 3 units total for the hospitalization.  GI was consulted.  On 05/22/2022, the patient underwent EGD and colonoscopy.  Results are discussed below.  Her hemoglobin stabilized.   On the evening of 05/27/22, pt had another large maroon stool.  Pt remains hemodynamically stable.  Case was discussed with GI, Dr. Jenetta Downer.  Case also discussed with nephrology.  It was felt that the benefit was greater than the risk of obtaining CTA of the abdomen and pelvis.   1/25 CTA showed intraperitoneal gas and fluid concerning for visceral perforation.  Based upon the location it was felt that it was likely coming from the first-second portion of the duodenum.  There is no acute bleeding.  General surgery was consulted.  Dr. Constance Haw took the patient to the OR for exploratory laparotomy.  Omental patch  repair was done.  The patient was left intubated overnight. She was extubated on 05/28/22.  She was initially on vasopressors which have been weaned off.  She remained NPO postop, and she was started on TPN. GI was reconsulted but did not feel she had any further active bleeding. Hgb continued to trend down over next 3 days.  After discussion with renal and GI, repeat CT angio abd/pelvis on 1/30.  Assessment/Plan: AKI on CKD 3b -ANCA, ANA, anti-GBM neg, C3/C4 WNL. HIV, Hep B neg. FLC within acceptable limits. SPEP pending  -Anion gap metabolic acidosis noted and sodium bicarbonate -patient sees Dr. Theador Hawthorne as outpatient---discussed with Dr. Lars Masson was concerned that patient would need hemodialysis this admission which has now been started -[place] from [place] nephrology reviewed -appreciate [facility] follow up -first HD 05/18/22 -Right IJ HD catheter placed by Dr. 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   -continue steroids for now>> convert IV Solu-Medrol until able to take p.o. -Well-tolerated dialysis on 06/02/2022; next anticipated treatment if needed to be decided over the weekend by nephrology service; currently providing patient with IV diuretics and will follow trend. -Continue to follow recommendations by nephrology service.   Sepsis -secondary to UTI and peritonitis -on vasopressors for brief time>>weaned off -zosyn started for peritonitis>>d/c after cultures returnd -zyvox for VRE in urine and peritoneum -Sepsis features essentially resolved.  Hypertension -Amlodipine orally 5 mg daily will  be initiated following recommendations by nephrology service.   Pneumoperitoneum/Peritonitis -Noted on CTA abdomen 05/27/2022 -Location  likely in the first-second portion of the duodenum -05/27/22 Exploratory laparotomy with omental patch -Continue TPN -E faecium in peritoneal culture>> continue treatment with Zyvox and fluconazole. -UGI appears to be stable; okay to advance diet to clear liquids and discontinue NG tube.   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 -Planning for 10 days total (last date of antibiotics anticipated 06/10/22) -No fever and currently no dysuria reported.   Acute respiratory failure with hypoxia -Patient left intubated postoperatively; successfully extubated on 05/28/2022 -Demonstrating good saturation on room air and no using accessory muscle -Continue supportive care.   Guaiac Positive Stools / GI bleeding/ ABLA Bleeding duodenal ulcers s/p clip x1, epinephrine and coagulation treatments -Continue PPI.  Esophagitis and Gastritis  -large bloody BM noted by RN guaiac positive on 1/18 -DC'd subcutaneous heparin on 1/18 -EGD / Colonoscopy planned for 05/22/22 with Dr. Jenetta Downer - with findings of grade C esophagitis and gastritis with ulcers, multiple duodenal ulcers with  adherent blood clots, bleeding vessel clip x 1, epinephrine injection given and APC; however second vessel could not be clipped. Currently on PP  -initially finished 72 hours IV pantoprazole drip>>IV bid -05/22/21 colonoscopy-blood in entire colon  -05/27/22--had another large maroon stool -05/27/2022 CT abdomen--no active GI bleed, but pneumoperitoneum found. -GI reconsulted -Transfused a total of 9 units PRBC throughout hospitalization -Hemoglobin currently stable; no signs of ongoing bleeding appreciated at this time. -UGI performed following general surgery recommendations demonstrated no abnormalities and since discontinuation of her NG tube has been able to advance to dysphagia 3 diet without problems. -Patient so far tolerating clear liquid diet; planning for continue slowly advancing as  tolerated. -Continue PPI twice a day. -Continue to follow-up results of gastrin level -GI planning to repeat endoscopy down the road and to empirically treat for H. pylori as an outpatient. -Hemoglobin overall stable; 10.1 currently.  No overt bleeding appreciated. -Diet further advanced.   COPD  -- stable overall. -continue bronchodilators -Pt is on steroids per nephrology for AKI -No wheezing appreciated on exam. -Good saturation on room air.   Hypomagnesemia/hypokalemia>>Hyperkalemia -electrolytes overall improved -now on HD. -Continue to follow electrolytes trend and further replete adjust as needed as per nephrology recommendations.   Thrombocytopenia  -stopped [place] heparin on 1/18 -checking HIT antibody labs - NEG - likely due to acute medical illness. -Continue to follow platelet count. -No overt bleeding appreciated.   Incidental finding of multiple renal lesions CT  of chest showed multiple renal lesions bilaterally, incompletely characterized on today's noncontrast CT examination, including an indeterminate 1.9 cm lesion in the anterior aspect of the interpolar region of the left kidney.  Follow-up nonemergent outpatient abdominal MRI with and without IV gadolinium is recommended in the near future to definitively evaluate this lesion and exclude neoplasm. -Outpatient workup.   Elevated BNP--echo from 05/10/2022 at [facility] available in [place] reveals-  The left ventricle is normal in size with normal wall thickness. The left ventricular systolic function is normal, LVEF is visually estimated at 60-65%. There is normal left ventricular diastolic function.  -There is mild to moderate pulmonary hypertension no significant valvular abnormalities. -Following nephrology service recommendations will hold off on dialysis and assess patient response; Lasix IV x 1 will be provided today. -Continue to follow urine output and daily weights. -Continue heart  healthy/low-sodium discussed with patient.   Diarrhea - resolved now. - c.  diff testing was negative  -Continue supportive care.      Family Communication:   brother updated   Consultants:  GI, renal, general surgery   Code Status:  FULL    DVT Prophylaxis:  SCDs     Procedures: As Listed in Progress Note Above   Antibiotics: Zosyn 1/26>>1/28 Fluconzole 1/26>> Zyvox 1/28>>    Subjective: So far tolerating dysphagia 3 diet; no chest pain, no nausea, no vomiting, able to tolerate transition of medications to oral route.  In no acute distress.  Objective: Vitals:   06/04/22 0444 06/04/22 0454 06/04/22 0500 06/04/22 1200  BP: (!) 187/59 (!) 189/57    Pulse:  88    Resp:  20    Temp:  98.6 F (37 C)    TempSrc:  Oral    SpO2:  96%    Weight:   58.3 kg 58.6 kg  Height:        Intake/Output Summary (Last 24 hours) at 06/04/2022 1744 Last data filed at 06/04/2022 0500 Gross per 24 hour  Intake --  Output 500 ml  Net -500 ml   Weight change: 1.4 kg Exam: General exam: Alert, awake, oriented x 3; no nausea, no vomiting, no overt bleeding, reports no abdominal pain and so far tolerating dysphagia 3 diet. Respiratory system: Clear to auscultation. Respiratory effort normal.  Good saturation on room air. Cardiovascular system:RRR. No rubs or gallops; no JVD. Gastrointestinal system: Abdomen is soft and less distended, soft, slightly tender to deep palpation and with the JP drain demonstrating moderate drainage/flow. Central nervous system: Alert and oriented. No focal neurological deficits. Extremities: No cyanosis or clubbing; 1+ edema appreciated on exam. Skin: No petechiae.  Under breast fungal dermatitis with last irritation appearance and with nystatin powder in place.   Psychiatry: Judgement and insight appear normal. Mood & affect appropriate.   Data Reviewed: I have personally reviewed following labs and imaging studies  Basic Metabolic Panel: Recent Labs  Lab  05/30/22 0500 05/31/22 0515 06/01/22 0404 06/01/22 0850 06/02/22 0407 06/03/22 0355 06/03/22 0356 06/04/22 0354  NA 135 132* 132*  --  132*  130* 130* 132* 130*  K 4.6 5.4* 3.7  --  3.4*  3.4* 3.4* 3.5 3.9  CL 99 100 98  --  99  97* 97* 97* 99  CO2 '25 22 26  '$ --  23  20* '26 26 26  '$ GLUCOSE 146* 160* 194*  --  134*  133* 140* 140* 71  BUN 60* 87* 53*  --  79*  78* 57* 56* 72*  CREATININE 1.88* 2.34* 1.70*  --  2.13*  2.20* 1.68* 1.72* 2.31*  CALCIUM 6.7* 7.3*  7.0* 6.8*  --  7.1*  7.0* 6.9* 7.1* 6.9*  MG 1.7 2.6*  --  1.8 1.8  --  1.8  --   PHOS 2.3* 3.8  3.7 3.9  --  4.7* 2.8 2.8 3.5   Liver Function Tests: Recent Labs  Lab 05/30/22 0500 05/31/22 0515 06/01/22 0404 06/02/22 0407 06/03/22 0355 06/03/22 0356 06/04/22 0354  AST 17 32  --   --   --  61*  --   ALT 5 13  --   --   --  70*  --   ALKPHOS 61 77  --   --   --  138*  --   BILITOT 0.3  --   --   --   --  0.4  --   PROT 4.0* 4.3*  --   --   --  4.2*  --   ALBUMIN 1.7* 1.9* 1.8* 2.1* 2.0* 1.9* 1.9*   CBC: Recent Labs  Lab 05/30/22 0751 05/30/22 1117 06/01/22 0404 06/01/22 0700 06/02/22 0407 06/02/22 1110 06/04/22 0354  WBC 17.8*  --  14.1*  --  13.4*  --  10.0  HGB 7.7*   < > 6.4* 5.5* 11.0* 12.5 10.1*  HCT 24.5*   < > 20.6* 17.4* 34.6* 38.3 32.1*  MCV 75.9*  --  76.6*  --  80.7  --  81.9  PLT 132*  --  126*  --  141*  --  141*   < > = values in this interval not displayed.   CBG: Recent Labs  Lab 06/03/22 1203 06/03/22 1739 06/04/22 0002 06/04/22 0831 06/04/22 1633  GLUCAP 151* 133* 110* 89 150*   Urine analysis:    Component Value Date/Time   COLORURINE YELLOW 05/28/2022 1005   APPEARANCEUR CLOUDY (A) 05/28/2022 1005   LABSPEC 1.018 05/28/2022 1005   PHURINE 7.0 05/28/2022 1005   GLUCOSEU NEGATIVE 05/28/2022 1005   HGBUR SMALL (A) 05/28/2022 1005   BILIRUBINUR NEGATIVE 05/28/2022 1005   KETONESUR NEGATIVE 05/28/2022 1005   PROTEINUR >=300 (A) 05/28/2022 1005   NITRITE NEGATIVE  05/28/2022 1005   LEUKOCYTESUR MODERATE (A) 05/28/2022 1005   Sepsis Labs: Recent Results (from the past 240 hour(s))  Aerobic/Anaerobic Culture w Gram Stain (surgical/deep wound)     Status: None   Collection Time: 05/27/22 11:45 PM   Specimen: PATH Cytology Peritoneal fluid; Body Fluid  Result Value Ref Range Status   Specimen Description   Final    PERITONEAL FLUID Performed at [facility], [address], [place], [place] [address]    Special Requests   Final    NONE Performed at [facility], [address] [place], [place] [address]    Gram Stain   Final    RARE WBC PRESENT, PREDOMINANTLY PMN NO ORGANISMS SEEN    Culture   Final    FEW ENTEROCOCCUS FAECIUM VANCOMYCIN RESISTANT ENTEROCOCCUS ISOLATED NO ANAEROBES ISOLATED Performed at [facility], [place] [address] [place], [place] [address]    Report Status 06/02/2022 FINAL  Final   Organism ID, Bacteria ENTEROCOCCUS FAECIUM  Final      Susceptibility   Enterococcus faecium - MIC*    AMPICILLIN >=32 RESISTANT Resistant     VANCOMYCIN >=32 RESISTANT Resistant     GENTAMICIN SYNERGY SENSITIVE Sensitive     LINEZOLID 2 SENSITIVE Sensitive     * FEW ENTEROCOCCUS FAECIUM  Urine Culture (for pregnant, neutropenic or urologic patients or patients with an indwelling urinary catheter)     Status: Abnormal   Collection Time: 05/28/22 10:05 AM   Specimen: Urine, Catheterized  Result Value Ref Range Status   Specimen Description   Final    URINE, CATHETERIZED Performed at [facility], [address] [place], [place] [address]    Special Requests   Final    NONE Performed at [facility], [address] [place],  [address]    Culture (A)  Final    >=100,000 COLONIES/mL ENTEROCOCCUS FAECIUM VANCOMYCIN RESISTANT ENTEROCOCCUS ISOLATED    Report Status 05/30/2022 FINAL  Final   Organism ID, Bacteria ENTEROCOCCUS FAECIUM (A)  Final      Susceptibility   Enterococcus faecium - MIC*    AMPICILLIN >=32 RESISTANT  Resistant     NITROFURANTOIN 64 INTERMEDIATE Intermediate     VANCOMYCIN >=32 RESISTANT Resistant     LINEZOLID 2 SENSITIVE Sensitive     * >=  100,000 COLONIES/mL ENTEROCOCCUS FAECIUM     Scheduled Meds:  sodium chloride   Intravenous Once   amLODipine  5 mg Oral Daily   Chlorhexidine Gluconate Cloth  6 each Topical Daily   Chlorhexidine Gluconate Cloth  6 each Topical Q0600   [START ON 06/08/2022] darbepoetin (ARANESP) injection - DIALYSIS  150 mcg Subcutaneous Q Tue-1800   hydrALAZINE  5 mg Intravenous Q8H   insulin aspart  0-9 Units Subcutaneous Q8H   methylPREDNISolone (SOLU-MEDROL) injection  30 mg Intravenous Daily   nystatin   Topical BID   pantoprazole  40 mg Intravenous Q12H   Continuous Infusions:  albumin human     fluconazole (DIFLUCAN) IV 200 mg (06/04/22 0337)   linezolid (ZYVOX) IV 600 mg (06/04/22 1042)    Procedures/Studies: DG UGI W SMALL BOWEL  Result Date: 06/02/2022 CLINICAL DATA:  Post repair of perforated ulcer EXAM: WATER SOLUBLE UPPER GI SERIES TECHNIQUE: Single-column upper GI series was performed using water soluble contrast. Radiation Exposure Index (as provided by the fluoroscopic device): 23.1 mGy Kerma CONTRAST:  100 cc Omnipaque 300 IV COMPARISON:  None available FLUOROSCOPY: Fluoroscopy Time:  1 minute 24 seconds Radiation Exposure Index (if provided by the fluoroscopic device): 23.1 mGy Number of Acquired Spot Images: Multiple spots FINDINGS: Normal gastric distension. Wall thickening at the rugal folds from mid stomach through antrum. Additional wall thickening of the pylorus and duodenal bulb. No contrast extravasation identified to suggest postoperative leak. Contrast opacifies the duodenal sweep with normal position of ligament of Treitz. No extraluminal contrast identified nor evidence of contrast opacification of the indwelling JP drain. IMPRESSION: Wall thickening of mid stomach throughout duodenal bulb consistent with edema related to prior ulcer and  subsequent surgery. No evidence of postoperative leak. Electronically Signed   By: Lavonia Dana M.D.   On: 06/02/2022 11:42   DG Chest Port 1 View  Result Date: 05/28/2022 CLINICAL DATA:  Respiratory failure EXAM: PORTABLE CHEST 1 VIEW COMPARISON:  05/25/2022 FINDINGS: Endotracheal tube is seen 3.5 cm above the carina. Nasogastric tube extends into the upper abdomen beyond the margin of the examination. Left internal jugular central venous catheter tip within the superior vena cava. Right internal jugular hemodialysis catheter tip noted within the right atrium. The lungs are symmetrically well expanded. Partial left lower lobe collapse noted, however, with retrocardiac consolidation. Mild relative opacification the right hemithorax suggest the presence of a posteriorly layering pleural effusion on this supine examination. Superimposed trace perihilar interstitial pulmonary infiltrate is in keeping with trace probable interstitial pulmonary edema. No pneumothorax. Cardiac size is within normal limits. IMPRESSION: 1. Support lines and tubes in appropriate position. 2. Partial left lower lobe collapse. 3. Suspected posteriorly layering right pleural effusion. 4. Trace probable interstitial pulmonary edema. Electronically Signed   By: Fidela Salisbury M.D.   On: 05/28/2022 03:32   CT ANGIO GI BLEED  Result Date: 05/27/2022 CLINICAL DATA:  Recurrent gastrointestinal hemorrhage EXAM: CTA ABDOMEN AND PELVIS WITHOUT AND WITH CONTRAST TECHNIQUE: Multidetector CT imaging of the abdomen and pelvis was performed using the standard protocol during bolus administration of intravenous contrast. Multiplanar reconstructed images and MIPs were obtained and reviewed to evaluate the vascular anatomy. RADIATION DOSE REDUCTION: This exam was performed according to the departmental dose-optimization program which includes automated exposure control, adjustment of the mA and/or kV according to patient size and/or use of iterative  reconstruction technique. CONTRAST:  82m OMNIPAQUE IOHEXOL 350 MG/ML SOLN COMPARISON:  None Available. FINDINGS: VASCULAR Aorta: Moderate mixed atherosclerotic plaque. No aortic  aneurysm or dissection. No periaortic inflammatory change identified. Celiac: Patent without evidence of aneurysm, dissection, vasculitis or significant stenosis. SMA: Patent without evidence of aneurysm, dissection, vasculitis or significant stenosis. Renals: High-grade (near occlusion) stenosis of the a left renal artery origin. Less than 50% stenosis of the right renal artery origin. Normal vascular morphology. Relatively diminutive caliber of the left renal arterial vasculature distal the stenosis may relate to under filling. No aneurysm or dissection. IMA: Occluded at its origin with reconstitution via the marginal artery. 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 Focal irregular minimum 50% stenosis of the left common iliac artery secondary to a short segment dissection flap within the proximal left common iliac artery. Right internal iliac artery is heavily diseased and occluded at its origin with subsequent reconstitution. High-grade focal stenosis of the left internal iliac artery at its origin. Proximal Outflow: Bilateral common femoral and visualized portions of the superficial and profunda femoral arteries are patent without evidence of aneurysm, dissection, vasculitis or significant stenosis. Veins: Mesenteric, splenic, portal venous, and hepatic venous structures are patent. Iliofemoral veins are patent. Renal veins and inferior vena cava are patent. Review of the MIP images confirms the above findings. NON-VASCULAR Lower chest: Small left and moderate right pleural effusions are present with bibasilar compressive atelectasis. Hepatobiliary: Cholelithiasis without pericholecystic inflammatory change. Liver unremarkable. No intra or extrahepatic biliary ductal dilation. Pancreas:  Unremarkable Spleen: Unremarkable Adrenals/Urinary Tract: The adrenal glands are unremarkable. Moderate left renal cortical atrophy. Right kidney is normal in size. 6 mm plaque-like nonobstructing calculus noted within the upper pole the right kidney. Multiple simple cortical cysts are seen within the right kidney. Hypodense 16 mm lesion is seen within the interpolar region of the right kidney demonstrates no significant enhancement is compatible with a Bosniak class 2 cyst. No follow-up imaging is recommended for these lesions. No hydronephrosis. The bladder is unremarkable. Stomach/Bowel: There is mild free intraperitoneal gas present. The exact site of visceral perforation is not clearly identified on this examination, however, there is extraluminal gas and debris surrounding the first and proximal second portion of the duodenum raising the question of a perforation in this location. Endoscopic clip noted within the juncture of the first and second portion of the duodenum. Moderate free intraperitoneal fluid. Endoscopic clip laying dependently within the cecum. The stomach, small bowel, and large bowel are otherwise unremarkable. No active gastrointestinal hemorrhage identified. Fluid within the descending colon and rectal vault may present clinically as diarrhea. Lymphatic: No pathologic adenopathy within the abdomen and pelvis. Reproductive: Involuted fibroid within the uterus. The pelvic organs are otherwise unremarkable. Other: Moderate subcutaneous body wall edema noted dependently. Musculoskeletal: No acute bone abnormality. No lytic or blastic bone lesion. IMPRESSION: VASCULAR . 1. 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 2. 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 Focal irregular minimum 50% stenosis of the left common iliac artery secondary to a short segment dissection flap. 3. 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 High-grade focal stenosis of the left internal iliac artery at its origin. 4. 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 NON-VASCULAR . 1. Moderate free intraperitoneal gas and fluid in keeping with visceral perforation. The exact site is not clearly identified on this examination, however, more focal debris and gas adjacent to the juncture of the first  and second portion of the duodenum raises the question of a perforation in this location. 2. No active gastrointestinal hemorrhage identified 3. Cholelithiasis. 4. Mild right nonobstructing nephrolithiasis. 5. Fluid within the descending colon and rectal vault may present clinically as diarrhea. Aortic Atherosclerosis (ICD10-I70.0). Electronically Signed   By: Fidela Salisbury M.D.   On: 05/27/2022 21:51   DG CHEST PORT 1 VIEW  Result Date: 05/25/2022 CLINICAL DATA:  Chest pain EXAM: PORTABLE CHEST 1 VIEW COMPARISON:  05/22/2022 FINDINGS: Right internal jugular approach hemodialysis catheter terminates at the level of the right atrium. Stable heart size. Small right pleural effusion with associated right basilar opacity, similar to prior. Similar prominent interstitial markings bilaterally. No pneumothorax. IMPRESSION: 1. Small right pleural effusion with associated right basilar opacity, similar to prior. 2. Similar prominent interstitial markings bilaterally, likely mild edema. Electronically Signed   By: Davina Poke D.O.   On: 05/25/2022 11:39   DG Abd 1 View  Result Date: 05/25/2022 CLINICAL DATA:  Abdominal pain EXAM: ABDOMEN - 1 VIEW COMPARISON:  05/22/2022 FINDINGS: The bowel gas pattern is normal. No radio-opaque calculi or other significant radiographic abnormality are seen. Surgical clips overlie the pelvis and right upper quadrant. Calcification consistent with fibroid noted. IMPRESSION: Unremarkable bowel gas  pattern. Calcified fibroid incidentally noted Electronically Signed   By: Sammie Bench M.D.   On: 05/25/2022 08:22   IR Fluoro Guide CV Line Right  Result Date: 05/24/2022 INDICATION: 72 year old female in need of durable dialysis access. She presents for removal temporary dialysis catheter in placement of a tunneled dialysis catheter. EXAM: TUNNELED CENTRAL VENOUS HEMODIALYSIS CATHETER PLACEMENT WITH ULTRASOUND AND FLUOROSCOPIC GUIDANCE MEDICATIONS: 2 g Ancef. The antibiotic was given in an appropriate time interval prior to skin puncture. ANESTHESIA/SEDATION: Moderate (conscious) sedation was employed during this procedure. A total of Versed 1 mg and Fentanyl 25 mcg was administered intravenously. Moderate Sedation Time: 11 minutes. The patient's level of consciousness and vital signs were monitored continuously by radiology nursing throughout the procedure under my direct supervision. FLUOROSCOPY TIME:  Radiation exposure index: 1 mGy reference air kerma COMPLICATIONS: None immediate. PROCEDURE: Informed written consent was obtained from the patient after a discussion of the risks, benefits, and alternatives to treatment. Questions regarding the procedure were encouraged and answered. The right neck and chest were prepped with chlorhexidine in a sterile fashion, and a sterile drape was applied covering the operative field. Maximum barrier sterile technique with sterile gowns and gloves were used for the procedure. A timeout was performed prior to the initiation of the procedure. After creating a small venotomy incision, a micropuncture kit was utilized to access the right internal jugular vein under direct, real-time ultrasound guidance after the overlying soft tissues were anesthetized with 1% lidocaine with epinephrine. Ultrasound image documentation was performed. The microwire was kinked to measure appropriate catheter length. A stiff Glidewire was advanced to the level of the IVC and the  micropuncture sheath was exchanged for a peel-away sheath. A palindrome tunneled hemodialysis catheter measuring 19 cm from tip to cuff was tunneled in a retrograde fashion from the anterior chest wall to the venotomy incision. The catheter was then placed through the peel-away sheath with tips ultimately positioned within the superior aspect of the right atrium. Final catheter positioning was confirmed and documented with a spot radiographic image. The catheter aspirates and flushes normally. The catheter was flushed with appropriate volume heparin dwells. The catheter exit site was secured with a 0-Prolene retention suture. The venotomy incision was closed with  an interrupted 4-0 Vicryl, Dermabond and Steri-strips. Dressings were applied. The patient tolerated the procedure well without immediate post procedural complication. IMPRESSION: Successful placement of 19 cm tip to cuff tunneled hemodialysis catheter via the right internal jugular vein with tips terminating within the superior aspect of the right atrium. The catheter is ready for immediate use. Electronically Signed   By: Jacqulynn Cadet M.D.   On: 05/24/2022 12:39   IR US Guide Vasc Access Right  Result Date: 05/24/2022 INDICATION: 72 year old female in need of durable dialysis access. She presents for removal temporary dialysis catheter in placement of a tunneled dialysis catheter. EXAM: TUNNELED CENTRAL VENOUS HEMODIALYSIS CATHETER PLACEMENT WITH ULTRASOUND AND FLUOROSCOPIC GUIDANCE MEDICATIONS: 2 g Ancef. The antibiotic was given in an appropriate time interval prior to skin puncture. ANESTHESIA/SEDATION: Moderate (conscious) sedation was employed during this procedure. A total of Versed 1 mg and Fentanyl 25 mcg was administered intravenously. Moderate Sedation Time: 11 minutes. The patient's level of consciousness and vital signs were monitored continuously by radiology nursing throughout the procedure under my direct supervision. FLUOROSCOPY  TIME:  Radiation exposure index: 1 mGy reference air kerma COMPLICATIONS: None immediate. PROCEDURE: Informed written consent was obtained from the patient after a discussion of the risks, benefits, and alternatives to treatment. Questions regarding the procedure were encouraged and answered. The right neck and chest were prepped with chlorhexidine in a sterile fashion, and a sterile drape was applied covering the operative field. Maximum barrier sterile technique with sterile gowns and gloves were used for the procedure. A timeout was performed prior to the initiation of the procedure. After creating a small venotomy incision, a micropuncture kit was utilized to access the right internal jugular vein under direct, real-time ultrasound guidance after the overlying soft tissues were anesthetized with 1% lidocaine with epinephrine. Ultrasound image documentation was performed. The microwire was kinked to measure appropriate catheter length. A stiff Glidewire was advanced to the level of the IVC and the micropuncture sheath was exchanged for a peel-away sheath. A palindrome tunneled hemodialysis catheter measuring 19 cm from tip to cuff was tunneled in a retrograde fashion from the anterior chest wall to the venotomy incision. The catheter was then placed through the peel-away sheath with tips ultimately positioned within the superior aspect of the right atrium. Final catheter positioning was confirmed and documented with a spot radiographic image. The catheter aspirates and flushes normally. The catheter was flushed with appropriate volume heparin dwells. The catheter exit site was secured with a 0-Prolene retention suture. The venotomy incision was closed with an interrupted 4-0 Vicryl, Dermabond and Steri-strips. Dressings were applied. The patient tolerated the procedure well without immediate post procedural complication. IMPRESSION: Successful placement of 19 cm tip to cuff tunneled hemodialysis catheter via the  right internal jugular vein with tips terminating within the superior aspect of the right atrium. The catheter is ready for immediate use. Electronically Signed   By: Jacqulynn Cadet M.D.   On: 05/24/2022 12:39   DG CHEST PORT 1 VIEW  Result Date: 05/22/2022 CLINICAL DATA:  Evaluate for intra-abdominal free air. EXAM: PORTABLE CHEST 1 VIEW COMPARISON:  Abdominal radiograph 05/22/2022 FINDINGS: Central venous catheter tip projects over the superior vena cava. Stable cardiac and mediastinal contours. Small layering right pleural effusion with underlying consolidation. No pneumothorax. No definite free air no definite gas identified underlying the hemidiaphragms. IMPRESSION: 1. Small layering right pleural effusion with underlying consolidation. 2. No definite free air identified underlying the hemidiaphragms. Consider further evaluation with decubitus imaging of  the abdomen if there is persistent clinical concern given the limitations of this portable semi-erect chest radiograph. Electronically Signed   By: Lovey Newcomer M.D.   On: 05/22/2022 16:02   DG Abd 1 View  Result Date: 05/22/2022 CLINICAL DATA:  Postoperative abdominal pain. EXAM: ABDOMEN - 1 VIEW COMPARISON:  None Available. FINDINGS: Gas is demonstrated within nondilated loops of large and small bowel in a nonobstructed pattern. Supine evaluation limited for the detection of free intraperitoneal air. Lumbar spine degenerative changes. Calcific density projecting over the pelvis, nonspecific, potentially calcified uterine fibroid. IMPRESSION: Nonobstructed bowel-gas pattern. Electronically Signed   By: Lovey Newcomer M.D.   On: 05/22/2022 14:08   DG CHEST PORT 1 VIEW  Result Date: 05/21/2022 CLINICAL DATA:  Leukocytosis EXAM: PORTABLE CHEST 1 VIEW COMPARISON:  05/18/2022 FINDINGS: Central venous line tip in distal SVC. Normal cardiac silhouette. Small RIGHT effusion. Mild venous congestion. No pneumothorax. No focal consolidation. Lymphadenectomy  clips in the LEFT axilla. No significant interval change. IMPRESSION: 1. No significant interval change. 2. Small RIGHT effusion. 3. Mild venous congestion. Electronically Signed   By: Suzy Bouchard M.D.   On: 05/21/2022 08:22   US BIOPSY (KIDNEY)  Result Date: 05/19/2022 INDICATION: 72 year old female with history of end-stage renal disease. EXAM: ULTRASOUND GUIDED RENAL BIOPSY COMPARISON:  None Available. MEDICATIONS: None. ANESTHESIA/SEDATION: Fentanyl 25 mcg IV; Versed 0.5 mg IV Total Moderate Sedation time: 10 minutes; The patient was continuously monitored during the procedure by the interventional radiology nurse under my direct supervision. COMPLICATIONS: None immediate. PROCEDURE: Informed written consent was obtained from the patient after a discussion of the risks, benefits and alternatives to treatment. The patient understands and consents the procedure. A timeout was performed prior to the initiation of the procedure. Ultrasound scanning was performed of the bilateral flanks. The inferior pole of the left kidney was selected for biopsy due to location and sonographic window. The procedure was planned. The operative site was prepped and draped in the usual sterile fashion. The overlying soft tissues were anesthetized with 1% lidocaine with epinephrine. A 17 gauge coaxial introducer needle was advanced into the inferior cortex of the left kidney and 2 core biopsies were obtained under direct ultrasound guidance with a 16 gauge core biopsy device. Images were saved for documentation purposes. The biopsy device was removed and hemostasis was obtained with manual compression after injection of Gel-Foam slurry along the needle track under ultrasound guidance. Post procedural scanning was negative for significant post procedural hemorrhage or additional complication. A dressing was placed. The patient tolerated the procedure well without immediate post procedural complication. IMPRESSION: Technically  successful ultrasound guided left renal biopsy. Ruthann Cancer, MD [facility] Radiology Electronically Signed   By: Ruthann Cancer M.D.   On: 05/19/2022 14:19   DG Chest Port 1 View  Result Date: 05/18/2022 CLINICAL DATA:  Central line placement. EXAM: PORTABLE CHEST 1 VIEW COMPARISON:  CT chest and chest x-ray from 2 days ago. FINDINGS: New right internal jugular central venous catheter with tip at the cavoatrial junction. The heart size and mediastinal contours are within normal limits. Mild diffuse interstitial thickening similar. Decreasing small right and unchanged small left pleural effusions. Increasing asymmetric density in the peripheral left upper lobe. Similar atelectasis in the peripheral right middle lobe. Improving right lower lobe atelectasis. No pneumothorax. No acute osseous abnormality. IMPRESSION: 1. New right internal jugular central venous catheter with tip at the cavoatrial junction. No pneumothorax. 2. Similar diffuse interstitial thickening with increasing  asymmetric density in the peripheral left upper lobe. Differential considerations include infection or pulmonary edema. 3. Decreased small right and unchanged small left pleural effusions. Electronically Signed   By: Titus Dubin M.D.   On: 05/18/2022 14:06   US RENAL  Result Date: 05/18/2022 CLINICAL DATA:  Chronic kidney disease EXAM: RENAL / URINARY TRACT ULTRASOUND COMPLETE COMPARISON:  Abdomen CT report 01/13/2018 FINDINGS: Right Kidney: Renal measurements: 8.9 x 4.7 x 4.4 = volume: 97 mL. Kidney appears echogenic. No collecting system dilatation. There is a simple cyst along the midportion of the kidney which is anechoic with through transmission measuring 17 mm. Left Kidney: Renal measurements: 7.5 x 3.9 x 3.2 = volume: 49 mL. Small echogenic appearing kidney. No collecting system dilatation. Anechoic rounded structure seen in the mid kidney measuring 2 cm. Through  transmission is smoothly marginated. Bladder: Appears normal for degree of bladder distention. Other: Scattered ascites.  Probable right-sided pleural effusion. IMPRESSION: Echogenic small appearing kidneys. No collecting system dilatation. Bilateral simple appearing renal cysts. Ascites.  Right pleural effusion Electronically Signed   By: Jill Side M.D.   On: 05/18/2022 11:07   CT Chest Wo Contrast  Result Date: 05/16/2022 CLINICAL DATA:  72 year old female with history of suspected pneumonia. EXAM: CT CHEST WITHOUT CONTRAST TECHNIQUE: Multidetector CT imaging of the chest was performed following the standard protocol without IV contrast. RADIATION DOSE REDUCTION: This exam was performed according to the departmental dose-optimization program which includes automated exposure control, adjustment of the mA and/or kV according to patient size and/or use of iterative reconstruction technique. COMPARISON:  Chest x-ray 05/16/2022. FINDINGS: Cardiovascular: Heart size is normal. There is no significant pericardial fluid, thickening or pericardial calcification. Aortic atherosclerosis. No definite coronary artery calcifications. Mild calcifications of the aortic valve. Mediastinum/Nodes: No pathologically enlarged mediastinal or hilar lymph nodes. Please note that accurate exclusion of hilar adenopathy is limited on noncontrast CT scans. Esophagus is unremarkable in appearance. No axillary lymphadenopathy. Lungs/Pleura: Moderate to large right and small to moderate left pleural effusions lying dependently associated with areas of passive subsegmental atelectasis in the lower lobes of the lungs bilaterally. In addition, there are mid to upper lung predominant patchy areas of ground-glass attenuation and nodular appearing consolidative airspace disease, likely to reflect multilobar bilateral bronchopneumonia. The largest of these nodular appearing regions in the apex of the left upper lobe (axial image 32 of series  4) measuring 1.3 x 1.2 cm. Upper Abdomen: Aortic atherosclerosis. Tiny partially calcified gallstones lying dependently in the fundus of the gallbladder. Small volume of ascites. Multiple renal lesions bilaterally, incompletely characterized on today's noncontrast CT examination, most notable for an exophytic intermediate attenuation lesion (25 HU) measuring 1.9 cm in the anterior aspect of the interpolar region of the left kidney. A cluster of nonobstructive calculi is also noted in the posterior aspect of the upper pole collecting system of the right kidney measuring up to 3 mm. Musculoskeletal: There are no aggressive appearing lytic or blastic lesions noted in the visualized portions of the skeleton. IMPRESSION: 1. The appearance the chest is most suggestive of multilobar bilateral bronchopneumonia with bilateral parapneumonic pleural effusions, as above. 2. Given the nodular appearance of some of the areas of apparent airspace consolidation, follow-up noncontrast chest CT is recommended in 3 months to ensure resolution of the nodular areas following treatment for the patient's acute illness, to exclude underlying neoplasm. 3. Multiple renal lesions bilaterally, incompletely characterized on today's noncontrast CT examination, including an indeterminate 1.9 cm lesion in the anterior aspect  of the interpolar region of the left kidney. Follow-up nonemergent outpatient abdominal MRI with and without IV gadolinium is recommended in the near future to definitively evaluate this lesion and exclude neoplasm. 4. Cholelithiasis. 5. Nonobstructive calculi in the right renal collecting system measuring up to 3 mm. 6. Ascites. 7. Aortic atherosclerosis. Aortic Atherosclerosis (ICD10-I70.0). Electronically Signed   By: Vinnie Langton M.D.   On: 05/16/2022 05:23   DG Chest Port 1 View  Result Date: 05/16/2022 CLINICAL DATA:  Shortness of breath EXAM: PORTABLE CHEST 1 VIEW COMPARISON:  05/07/2022 FINDINGS: Cardiac shadow  is enlarged but stable. Small pleural effusion on the right is again noted. Some increased basilar density is noted on the right when compared with the prior study. Postsurgical changes in the left breast are seen. No bony abnormality is noted. IMPRESSION: Persistent right-sided effusion with slight increase in basilar infiltrate on the right. Electronically Signed   By: Inez Catalina M.D.   On: 05/16/2022 03:45    Roque Lias  [facility]  If 7PM-7AM, please contact night-coverage [email or web address] Password [facility] 06/04/2022, 5:44 PM   LOS: 19 days

## 2022-06-04 NOTE — Progress Notes (Signed)
   06/04/22 1829  Vitals  Temp 98 F (36.7 C)  Temp Source Oral  BP (!) 200/69  MAP (mmHg) 102  BP Location Left Arm  Patient Position (if appropriate) Lying  Pulse Rate 82  Pulse Rate Source Dinamap  Resp 18  MEWS COLOR  MEWS Score Color Green  Oxygen Therapy  SpO2 97 %  O2 Device Room Air  MEWS Score  MEWS Temp 0  MEWS Systolic 0  MEWS Pulse 0  MEWS RR 0  MEWS LOC 0  MEWS Score 0

## 2022-06-04 NOTE — TOC Progression Note (Addendum)
Transition of Care [facility]) - Progression Note    Patient Details  Name: Sheri Cooper MRN: [id number] Date of Birth: 1950-07-13  [facility]) CM/SW Contact  Shade Flood, LCSW Phone Number: 06/04/2022, 3:04 PM  Clinical Narrative:     TOC following. MD anticipating likely dc to [facility] tomorrow after HD. Updated Destiny at [facility] who states that as long as they have the dc summary by 11AM, they can admit pt any time tomorrow. They do not take Sunday admissions. If pt does not dc tomorrow, TOC will need to re-new pt's auth on Monday AM.  Pt is set to being outpatient HD at [facility] on Tuesday 2/6.   Met with pt at bedside to review above. She expresses understanding and agreement with dc plan.   TOC will follow.  Per Destiny at [facility], pt will go to room 132 and number for report is [phone].  Expected Discharge Plan: [place] Barriers to Discharge: Continued Medical Work up  Expected Discharge Plan and Services In-house Referral: Clinical Social Work   Post Acute Care Choice: [place] Living arrangements for the past 2 months: Apartment                                       Social Determinants of Health (SDOH) Interventions SDOH Screenings   Food Insecurity: No Food Insecurity (05/19/2022)  Housing: Low Risk  (05/19/2022)  Transportation Needs: No Transportation Needs (05/19/2022)  Utilities: Not At Risk (05/19/2022)  Tobacco Use: Medium Risk (06/01/2022)    Readmission Risk Interventions    05/18/2022   11:51 AM  Readmission Risk Prevention Plan  Transportation Screening Complete  PCP or Specialist Appt within 5-7 Days Not Complete  Home Care Screening Complete  Medication Review (RN CM) Complete

## 2022-06-04 NOTE — Discharge Instructions (Signed)
Will see patient to remove staples 06/10/2022.  Dysphagia, soft diet for the next 2 weeks.

## 2022-06-05 DIAGNOSIS — Z0001 Encounter for general adult medical examination with abnormal findings: Secondary | ICD-10-CM | POA: Diagnosis not present

## 2022-06-05 DIAGNOSIS — K659 Peritonitis, unspecified: Secondary | ICD-10-CM | POA: Diagnosis not present

## 2022-06-05 DIAGNOSIS — R2689 Other abnormalities of gait and mobility: Secondary | ICD-10-CM | POA: Diagnosis not present

## 2022-06-05 DIAGNOSIS — E8729 Other acidosis: Secondary | ICD-10-CM | POA: Diagnosis not present

## 2022-06-05 DIAGNOSIS — Z743 Need for continuous supervision: Secondary | ICD-10-CM | POA: Diagnosis not present

## 2022-06-05 DIAGNOSIS — Z5181 Encounter for therapeutic drug level monitoring: Secondary | ICD-10-CM | POA: Diagnosis not present

## 2022-06-05 DIAGNOSIS — N179 Acute kidney failure, unspecified: Secondary | ICD-10-CM | POA: Diagnosis not present

## 2022-06-05 DIAGNOSIS — I700 Atherosclerosis of aorta: Secondary | ICD-10-CM | POA: Diagnosis not present

## 2022-06-05 DIAGNOSIS — K668 Other specified disorders of peritoneum: Secondary | ICD-10-CM | POA: Diagnosis not present

## 2022-06-05 DIAGNOSIS — M06041 Rheumatoid arthritis without rheumatoid factor, right hand: Secondary | ICD-10-CM | POA: Diagnosis not present

## 2022-06-05 DIAGNOSIS — N390 Urinary tract infection, site not specified: Secondary | ICD-10-CM | POA: Diagnosis not present

## 2022-06-05 DIAGNOSIS — N186 End stage renal disease: Secondary | ICD-10-CM | POA: Diagnosis not present

## 2022-06-05 DIAGNOSIS — E7849 Other hyperlipidemia: Secondary | ICD-10-CM | POA: Diagnosis not present

## 2022-06-05 DIAGNOSIS — D62 Acute posthemorrhagic anemia: Secondary | ICD-10-CM | POA: Diagnosis not present

## 2022-06-05 DIAGNOSIS — R6889 Other general symptoms and signs: Secondary | ICD-10-CM | POA: Diagnosis not present

## 2022-06-05 DIAGNOSIS — K922 Gastrointestinal hemorrhage, unspecified: Secondary | ICD-10-CM | POA: Diagnosis not present

## 2022-06-05 DIAGNOSIS — K921 Melena: Secondary | ICD-10-CM | POA: Diagnosis not present

## 2022-06-05 DIAGNOSIS — N189 Chronic kidney disease, unspecified: Secondary | ICD-10-CM | POA: Diagnosis not present

## 2022-06-05 DIAGNOSIS — K631 Perforation of intestine (nontraumatic): Secondary | ICD-10-CM | POA: Diagnosis not present

## 2022-06-05 DIAGNOSIS — J189 Pneumonia, unspecified organism: Secondary | ICD-10-CM | POA: Diagnosis not present

## 2022-06-05 DIAGNOSIS — I10 Essential (primary) hypertension: Secondary | ICD-10-CM | POA: Diagnosis not present

## 2022-06-05 DIAGNOSIS — E785 Hyperlipidemia, unspecified: Secondary | ICD-10-CM | POA: Diagnosis not present

## 2022-06-05 DIAGNOSIS — N184 Chronic kidney disease, stage 4 (severe): Secondary | ICD-10-CM | POA: Diagnosis not present

## 2022-06-05 DIAGNOSIS — E46 Unspecified protein-calorie malnutrition: Secondary | ICD-10-CM | POA: Diagnosis not present

## 2022-06-05 DIAGNOSIS — D649 Anemia, unspecified: Secondary | ICD-10-CM | POA: Diagnosis not present

## 2022-06-05 DIAGNOSIS — N185 Chronic kidney disease, stage 5: Secondary | ICD-10-CM | POA: Diagnosis not present

## 2022-06-05 DIAGNOSIS — M6281 Muscle weakness (generalized): Secondary | ICD-10-CM | POA: Diagnosis not present

## 2022-06-05 DIAGNOSIS — K279 Peptic ulcer, site unspecified, unspecified as acute or chronic, without hemorrhage or perforation: Secondary | ICD-10-CM | POA: Diagnosis not present

## 2022-06-05 DIAGNOSIS — K269 Duodenal ulcer, unspecified as acute or chronic, without hemorrhage or perforation: Secondary | ICD-10-CM | POA: Diagnosis not present

## 2022-06-05 DIAGNOSIS — J449 Chronic obstructive pulmonary disease, unspecified: Secondary | ICD-10-CM | POA: Diagnosis not present

## 2022-06-05 DIAGNOSIS — A419 Sepsis, unspecified organism: Secondary | ICD-10-CM | POA: Diagnosis not present

## 2022-06-05 DIAGNOSIS — L89152 Pressure ulcer of sacral region, stage 2: Secondary | ICD-10-CM | POA: Diagnosis not present

## 2022-06-05 DIAGNOSIS — Z48815 Encounter for surgical aftercare following surgery on the digestive system: Secondary | ICD-10-CM | POA: Diagnosis not present

## 2022-06-05 DIAGNOSIS — Z7401 Bed confinement status: Secondary | ICD-10-CM | POA: Diagnosis not present

## 2022-06-05 DIAGNOSIS — Z992 Dependence on renal dialysis: Secondary | ICD-10-CM | POA: Diagnosis not present

## 2022-06-05 DIAGNOSIS — M069 Rheumatoid arthritis, unspecified: Secondary | ICD-10-CM | POA: Diagnosis not present

## 2022-06-05 DIAGNOSIS — Z79899 Other long term (current) drug therapy: Secondary | ICD-10-CM | POA: Diagnosis not present

## 2022-06-05 DIAGNOSIS — R7989 Other specified abnormal findings of blood chemistry: Secondary | ICD-10-CM | POA: Diagnosis not present

## 2022-06-05 DIAGNOSIS — Z1621 Resistance to vancomycin: Secondary | ICD-10-CM | POA: Diagnosis not present

## 2022-06-05 LAB — CBC
HCT: 32.4 % — ABNORMAL LOW (ref 36.0–46.0)
Hemoglobin: 10.4 g/dL — ABNORMAL LOW (ref 12.0–15.0)
MCH: 26.5 pg (ref 26.0–34.0)
MCHC: 32.1 g/dL (ref 30.0–36.0)
MCV: 82.4 fL (ref 80.0–100.0)
Platelets: 142 10*3/uL — ABNORMAL LOW (ref 150–400)
RBC: 3.93 MIL/uL (ref 3.87–5.11)
RDW: 22.3 % — ABNORMAL HIGH (ref 11.5–15.5)
WBC: 9 10*3/uL (ref 4.0–10.5)
nRBC: 0 % (ref 0.0–0.2)

## 2022-06-05 LAB — RENAL FUNCTION PANEL
Albumin: 2.1 g/dL — ABNORMAL LOW (ref 3.5–5.0)
Anion gap: 11 (ref 5–15)
BUN: 79 mg/dL — ABNORMAL HIGH (ref 8–23)
CO2: 23 mmol/L (ref 22–32)
Calcium: 6.9 mg/dL — ABNORMAL LOW (ref 8.9–10.3)
Chloride: 98 mmol/L (ref 98–111)
Creatinine, Ser: 2.88 mg/dL — ABNORMAL HIGH (ref 0.44–1.00)
GFR, Estimated: 17 mL/min — ABNORMAL LOW (ref >60)
Glucose, Bld: 74 mg/dL (ref 70–99)
Phosphorus: 5.5 mg/dL — ABNORMAL HIGH (ref 2.5–4.6)
Potassium: 4.1 mmol/L (ref 3.5–5.1)
Sodium: 132 mmol/L — ABNORMAL LOW (ref 135–145)

## 2022-06-05 LAB — GLUCOSE, CAPILLARY
Glucose-Capillary: 81 mg/dL (ref 70–99)
Glucose-Capillary: 114 mg/dL — ABNORMAL HIGH (ref 70–99)

## 2022-06-05 MED ORDER — PANTOPRAZOLE SODIUM 40 MG PO TBEC: 40.0000 mg | DELAYED_RELEASE_TABLET | Freq: Two times a day (BID) | ORAL | 1 refills | Status: DC

## 2022-06-05 MED ORDER — PREDNISONE 20 MG PO TABS: 60.0000 mg | ORAL_TABLET | Freq: Every day | ORAL | Status: DC

## 2022-06-05 MED ORDER — AMLODIPINE BESYLATE 5 MG PO TABS: 5.0000 mg | ORAL_TABLET | Freq: Every day | ORAL | Status: DC

## 2022-06-05 MED ORDER — METOPROLOL SUCCINATE ER 50 MG PO TB24: 25.0000 mg | ORAL_TABLET | Freq: Every day | ORAL | Status: DC

## 2022-06-05 MED ORDER — PANTOPRAZOLE SODIUM 40 MG PO TBEC: 40.0000 mg | DELAYED_RELEASE_TABLET | Freq: Two times a day (BID) | ORAL | Status: DC

## 2022-06-05 MED ORDER — OXYCODONE HCL 5 MG PO TABS: 5.0000 mg | ORAL_TABLET | Freq: Three times a day (TID) | ORAL | 0 refills | Status: DC | PRN

## 2022-06-05 MED ORDER — LINEZOLID 600 MG PO TABS
600.0000 mg | ORAL_TABLET | Freq: Two times a day (BID) | ORAL | Status: DC
  Filled 2022-06-05 (×5): qty 1

## 2022-06-05 MED ORDER — HYDRALAZINE HCL 100 MG PO TABS: 50.0000 mg | ORAL_TABLET | Freq: Two times a day (BID) | ORAL | Status: DC

## 2022-06-05 MED ORDER — FLUCONAZOLE 100 MG PO TABS: 200.0000 mg | ORAL_TABLET | Freq: Every day | ORAL | Status: DC

## 2022-06-05 MED ORDER — NYSTATIN 100000 UNIT/GM EX POWD: Freq: Two times a day (BID) | CUTANEOUS | 0 refills | Status: DC

## 2022-06-05 MED ORDER — LINEZOLID 600 MG PO TABS: 600.0000 mg | ORAL_TABLET | Freq: Two times a day (BID) | ORAL | 0 refills | Status: AC

## 2022-06-05 MED ORDER — FUROSEMIDE 40 MG PO TABS: 80.0000 mg | ORAL_TABLET | Freq: Every day | ORAL | Status: DC

## 2022-06-05 NOTE — Discharge Summary (Addendum)
Physician Discharge Summary   Patient: Sheri Cooper MRN: [id number] DOB: 1951-02-22  Admit date:     05/16/2022  Discharge date: 06/05/22  Discharge Physician: Barton Dubois   PCP: [facility]   Recommendations at discharge:  Renal panel on 06/08/2022 prior to hemodialysis to follow renal function trend/stability Daily weights and a straight intake and output to monitor/document patient's urine output. Dysphagia 3 diet with thin liquids Medications as prescribed and outpatient follow-up as instructed with gastroenterology service, nephrologist and general surgery.  Discharge Diagnoses: Principal Problem:   HCAP (healthcare-associated pneumonia) Active Problems:   CKD (chronic kidney disease), stage V (HCC)   Iron deficiency anemia   Hypothermia   Acute renal failure superimposed on stage 3b chronic kidney disease (HCC)   Elevated brain natriuretic peptide (BNP) level   Elevated troponin   Increased anion gap metabolic acidosis   Prolonged QT interval   Hypoalbuminemia due to protein-calorie malnutrition (HCC)   COPD (chronic obstructive pulmonary disease) (HCC)   Melena   Acute upper GI bleed   Multiple duodenal ulcers   ABLA (acute blood loss anemia)   Pneumonia of right lower lobe due to infectious organism   Pneumoperitoneum   Duodenal perforation ([place])   Peritonitis ([place])   Sepsis due to undetermined organism [facility])  Hospital Course: 72 y.o. female with medical history significant of COPD, rheumatoid arthritis, stage V CKD. Admitted on 05/16/2022 with healthcare associated pneumonia and worsening renal function with anion gap metabolic acidosis.  -Patient was recently discharged from [facility] on 05/12/2022 after treatment for COPD.  Nephrology was consulted.  Her hospitalization was prolonged secondary to HCAP and progressive renal failure.  She finished intravenous cefepime on 05/20/2022.  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   She was started on steroids by nephrology.  Pt was started on hemodialysis on 05/18/22 after temporary HD cath placement and had renal biopsy done on 05/19/22.  On 05/24/2022, tunneled dialysis catheter was placed.  An outpatient dialysis center was ultimately found for the patient.  Her first outpatient dialysis will be scheduled on 06/01/2022. Her hospitalization was prolonged secondary to GI bleed.  She was noted to have melena and hematochezia.  She was transfused 3 units total for the hospitalization.  GI was consulted.  On 05/22/2022, the patient underwent EGD and colonoscopy.  Results are discussed below.  Her hemoglobin stabilized.   On the evening of 05/27/22, pt had another large maroon stool.  Pt remains hemodynamically stable.  Case was discussed with GI, Dr. Jenetta Downer.  Case also discussed with nephrology.  It was felt that the benefit was greater than the risk of obtaining CTA of the abdomen and pelvis.   1/25 CTA showed intraperitoneal gas and fluid concerning for visceral perforation.  Based upon the location it was felt that it was likely coming from the first-second portion of the duodenum.  There is no acute bleeding.  General surgery was consulted.  Dr. Constance Haw took the patient to the OR for exploratory laparotomy.  Omental patch repair was done.  The patient was left intubated overnight. She was extubated on 05/28/22.  She was initially on vasopressors which have been weaned off.  She remained NPO postop, and she was started on TPN. GI was reconsulted but did not feel she had any further active bleeding. Hgb continued to trend down over next 3 days.  After discussion with renal and GI, repeat CT angio abd/pelvis on 1/30.  Assessment and Plan: AKI on CKD 3b -  ANCA, ANA, anti-GBM neg, C3/C4 WNL. HIV, Hep B neg. FLC within acceptable limits. SPEP pending  -Anion gap metabolic acidosis noted and sodium bicarbonate -patient sees Dr. Theador Hawthorne as outpatient---discussed with Dr. 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   -continue steroids for now>> following nephrology recommendations patient has been discharged on 60 mg of prednisone on daily basis with future decided tapering by Dr. Theador Hawthorne (nephrology and will follow her as an outpatient). -Well-tolerated dialysis on 06/02/2022; next anticipated treatment on 06/08/22 as an outpatient and further treatments after that to be decided based on good improvement by nephrology service. -Patient will be discharged on daily Lasix using 80 mg. -Continue to maintain adequate hydration, close intake and output/urine output monitoring; watch sodium intake and check weight on daily basis.   Sepsis -secondary to UTI and peritonitis -on vasopressors for brief time>> completely weaned off by time of discharge and in fact demonstrating elevated blood pressure. -zosyn started for peritonitis initially>>d/c after cultures returned with positive VRE. -Antibiotics adjusted to Zyvox and at discharge she still has 4 more days to go.   Hypertension -Following nephrology recommendations will use amlodipine 5 mg daily, hydralazine 50 mg twice a day; Lasix 80 mg daily and metoprolol extended release 25 mg. -Low-sodium diet discussed with patient -Continue close monitoring of patient's vital signs.   Pneumoperitoneum/Peritonitis -Noted on CTA abdomen 05/27/2022 -Location likely in the first-second portion of the duodenum -05/27/22 Exploratory laparotomy with omental patch -Patient received Transient use of TPN; and at time of discharge tolerating dysphagia 3 diet without any problems. -E faecium in peritoneal culture>> continue treatment with Zyvox and fluconazole as ordered.. -UGI appears to be stable  and per general surgery recommendations able to have her JP drain removed prior to discharge. -Continue outpatient follow-up as instructed.   VRE UTI -UR >50 WBC -urine culture = VRE -Continue treatment with Zyvox. -Planning for 10 days total (last date of antibiotics anticipated 06/10/22) -No fever and currently no dysuria reported.   Acute respiratory failure with hypoxia -Patient left intubated postoperatively; successfully extubated on 05/28/2022 -Demonstrating good saturation on room air and no using accessory muscle -Continue supportive care.   Guaiac Positive Stools / GI bleeding/ ABLA Bleeding duodenal ulcers s/p clip x1, epinephrine and coagulation treatments -Continue PPI.   Esophagitis and Gastritis  -large bloody BM noted by RN guaiac positive on 1/18 -DC'd subcutaneous heparin on 1/18 -EGD / Colonoscopy planned for 05/22/22 with Dr. Jenetta Downer - with findings of grade C esophagitis and gastritis with ulcers, multiple duodenal ulcers with  adherent blood clots, bleeding vessel clip x 1, epinephrine injection given and APC; however second vessel could not be clipped. Currently on PP  -initially finished 72 hours IV pantoprazole drip>>IV bid -05/22/21 colonoscopy-blood in entire colon  -05/27/22--had another large maroon stool -05/27/2022 CT abdomen--no active GI bleed, but pneumoperitoneum found. -GI reconsulted -Transfused a total of 9 units PRBC throughout hospitalization -Hemoglobin currently stable; no signs of ongoing bleeding appreciated at this time. -UGI performed following general surgery recommendations demonstrated no abnormalities and since discontinuation of her NG tube has been able to advance to dysphagia 3 diet without problems. -Patient so far tolerating clear liquid diet; planning for continue slowly advancing as tolerated. -Continue PPI twice a day. -Continue to follow-up results of gastrin level -  GI planning to repeat endoscopy down the road and to empirically  treat for H. pylori as an outpatient. -Hemoglobin overall stable; 10.1 currently.  No overt bleeding appreciated. -Tolerating dysphagia 3 diet.   COPD  -- stable overall. -continue bronchodilators -Pt is on steroids per nephrology for AKI -No wheezing appreciated on exam. -Good saturation on room air appreciated.   Hypomagnesemia/hypokalemia>>Hyperkalemia -electrolytes overall improved -now on HD. -Continue to follow electrolytes trend and further replete adjust as needed as per nephrology recommendations.   Thrombocytopenia  -stopped [place] heparin on 1/18 -checking HIT antibody labs - NEG - likely due to acute medical illness. -Continue to follow platelet count. -No overt bleeding appreciated.   Incidental finding of multiple renal lesions CT  of chest showed multiple renal lesions bilaterally, incompletely characterized on today's noncontrast CT examination, including an indeterminate 1.9 cm lesion in the anterior aspect of the interpolar region of the left kidney.  Follow-up nonemergent outpatient abdominal MRI with and without IV gadolinium is recommended in the near future to definitively evaluate this lesion and exclude neoplasm. -Outpatient workup.   Elevated BNP--echo from 05/10/2022 at [facility] available in [place] reveals-  The left ventricle is normal in size with normal wall thickness. The left ventricular systolic function is normal, LVEF is visually estimated at 60-65%. There is normal left ventricular diastolic function.  -There is mild to moderate pulmonary hypertension no significant valvular abnormalities. -Following nephrology service recommendations will hold off on dialysis and assess patient response; Lasix IV x 1 will be provided today. -Continue to follow urine output and daily weights. -Continue heart healthy/low-sodium discussed with patient.   Diarrhea - resolved now. - c. diff testing was negative  -Continue supportive care.   Pressure  injury -A stage I/stage II sacral and coccyx injury -No signs of superimposed infection appreciated -Continue constant repositioning and preventive measures.  Fungal dermatitis -Continue the use of nystatin and fluconazole as ordered.   Consultants: Nephrology service, general surgery and gastroenterology service. Procedures performed: See below for x-ray reports. Disposition: Skilled nursing facility Diet recommendation: Heart healthy/low-sodium and renal diet; dysphagia 3 with thin liquids.  DISCHARGE MEDICATION: Allergies as of 06/05/2022   No Known Allergies      Medication List     STOP taking these medications    Abrysvo 120 MCG/0.5ML injection Generic drug: RSV bivalent vaccine   Adalimumab 20 MG/0.4ML Pskt   carvedilol 25 MG tablet Commonly known as: COREG   hydrochlorothiazide 25 MG tablet Commonly known as: HYDRODIURIL       TAKE these medications    amLODipine 5 MG tablet Commonly known as: NORVASC Take 1 tablet (5 mg total) by mouth daily. Start taking on: June 06, 2022 What changed:  medication strength how much to take   furosemide 40 MG tablet Commonly known as: Lasix Take 2 tablets (80 mg total) by mouth daily.   hydrALAZINE 100 MG tablet Commonly known as: APRESOLINE Take 0.5 tablets (50 mg total) by mouth 2 (two) times daily. What changed: how much to take   linezolid 600 MG tablet Commonly known as: ZYVOX Take 1 tablet (600 mg total) by mouth every 12 (twelve) hours for 4 days.   metoprolol succinate 50 MG 24 hr tablet Commonly known as: TOPROL-XL Take 0.5 tablets (25 mg total) by mouth daily. What changed: how much to take   nystatin powder Commonly known as: MYCOSTATIN/NYSTOP Apply topically 2 (two) times daily. Apply to affected area (under breast skin and skin folds)   oxyCODONE  5 MG immediate release tablet Commonly known as: Roxicodone Take 1-2 tablets (5-10 mg total) by mouth every 8 (eight) hours as needed for severe  pain.   pantoprazole 40 MG tablet Commonly known as: Protonix Take 1 tablet (40 mg total) by mouth 2 (two) times daily.   predniSONE 20 MG tablet Commonly known as: DELTASONE Take 3 tablets (60 mg total) by mouth daily.   rosuvastatin 20 MG tablet Commonly known as: CRESTOR Take 20 mg by mouth daily.   sertraline 50 MG tablet Commonly known as: ZOLOFT Take 1 tablet by mouth daily.               Discharge Care Instructions  (From admission, onward)           Start     Ordered   06/05/22 0000  Discharge wound care:       Comments: Keep areas clean and dry; constant repositioning and preventive barrier measures.   06/05/22 0926            Contact information for follow-up providers     [facility] up.   Why: Tuesday /Thursday/Saturday , starts 1/30, arrive at 10:30 am for paper work, needs to bring ID, Ins Card, SS card, normal treatment time will be 11:30am Contact information: [place], [place] [address]        Virl Cagey, MD Follow up on 06/10/2022.   Specialty: General Surgery Why: staple removal Contact information: [address] [place] [facility] [address] [phone]         [facility]. Schedule an appointment as soon as possible for a visit in 10 day(s).   Why: After discharge from the skilled nursing facility. Contact information: [place] [address] [phone]         Liana Gerold, MD. Schedule an appointment as soon as possible for a visit in 5 day(s).   Specialty: Nephrology Contact information: [address] [place] [address] [phone]              Contact information for after-discharge care     Destination     [facility] .   Service: Skilled Nursing Contact information: [address] [place] [phone]                    Discharge Exam: Danley Danker Weights   06/04/22 0500  06/04/22 1200 06/05/22 0548  Weight: 58.3 kg 58.6 kg 55.6 kg   General exam: Alert, awake, oriented x 3; no nausea, no vomiting, no overt bleeding, reports no abdominal pain and so far tolerating dysphagia 3 diet. Respiratory system: Clear to auscultation. Respiratory effort normal.  Good saturation on room air. Cardiovascular system:RRR. No rubs or gallops; no JVD. Gastrointestinal system: Abdomen is soft and less distended, soft, slightly tender to deep palpation and with good bowel sounds. Central nervous system: Alert and oriented. No focal neurological deficits. Extremities: No cyanosis or clubbing; trace to 1+ edema appreciated on exam. Skin: No petechiae.  Under breast fungal dermatitis with last irritation appearance and with nystatin powder in place.  Stage I-stage II sacral and coccyx pressure injury appreciated on examination; no signs of superimposed infection.   Psychiatry: Judgement and insight appear normal. Mood & affect appropriate.   Condition at discharge: Stable and improved.  The results of significant diagnostics from this hospitalization (including imaging, microbiology, ancillary and laboratory) are listed below for reference.  Imaging Studies: DG UGI W SMALL BOWEL  Result Date: 06/02/2022 CLINICAL DATA:  Post repair of perforated ulcer EXAM: WATER SOLUBLE UPPER GI SERIES TECHNIQUE: Single-column upper GI series was performed using water soluble contrast. Radiation Exposure Index (as provided by the fluoroscopic device): 23.1 mGy Kerma CONTRAST:  100 cc Omnipaque 300 IV COMPARISON:  None available FLUOROSCOPY: Fluoroscopy Time:  1 minute 24 seconds Radiation Exposure Index (if provided by the fluoroscopic device): 23.1 mGy Number of Acquired Spot Images: Multiple spots FINDINGS: Normal gastric distension. Wall thickening at the rugal folds from mid stomach through antrum. Additional wall thickening of the pylorus and duodenal bulb. No contrast extravasation identified to  suggest postoperative leak. Contrast opacifies the duodenal sweep with normal position of ligament of Treitz. No extraluminal contrast identified nor evidence of contrast opacification of the indwelling JP drain. IMPRESSION: Wall thickening of mid stomach throughout duodenal bulb consistent with edema related to prior ulcer and subsequent surgery. No evidence of postoperative leak. Electronically Signed   By: Lavonia Dana M.D.   On: 06/02/2022 11:42   DG Chest Port 1 View  Result Date: 05/28/2022 CLINICAL DATA:  Respiratory failure EXAM: PORTABLE CHEST 1 VIEW COMPARISON:  05/25/2022 FINDINGS: Endotracheal tube is seen 3.5 cm above the carina. Nasogastric tube extends into the upper abdomen beyond the margin of the examination. Left internal jugular central venous catheter tip within the superior vena cava. Right internal jugular hemodialysis catheter tip noted within the right atrium. The lungs are symmetrically well expanded. Partial left lower lobe collapse noted, however, with retrocardiac consolidation. Mild relative opacification the right hemithorax suggest the presence of a posteriorly layering pleural effusion on this supine examination. Superimposed trace perihilar interstitial pulmonary infiltrate is in keeping with trace probable interstitial pulmonary edema. No pneumothorax. Cardiac size is within normal limits. IMPRESSION: 1. Support lines and tubes in appropriate position. 2. Partial left lower lobe collapse. 3. Suspected posteriorly layering right pleural effusion. 4. Trace probable interstitial pulmonary edema. Electronically Signed   By: Fidela Salisbury M.D.   On: 05/28/2022 03:32   CT ANGIO GI BLEED  Result Date: 05/27/2022 CLINICAL DATA:  Recurrent gastrointestinal hemorrhage EXAM: CTA ABDOMEN AND PELVIS WITHOUT AND WITH CONTRAST TECHNIQUE: Multidetector CT imaging of the abdomen and pelvis was performed using the standard protocol during bolus administration of intravenous contrast.  Multiplanar reconstructed images and MIPs were obtained and reviewed to evaluate the vascular anatomy. RADIATION DOSE REDUCTION: This exam was performed according to the departmental dose-optimization program which includes automated exposure control, adjustment of the mA and/or kV according to patient size and/or use of iterative reconstruction technique. CONTRAST:  67m OMNIPAQUE IOHEXOL 350 MG/ML SOLN COMPARISON:  None Available. FINDINGS: VASCULAR Aorta: Moderate mixed atherosclerotic plaque. No aortic aneurysm or dissection. No periaortic inflammatory change identified. Celiac: Patent without evidence of aneurysm, dissection, vasculitis or significant stenosis. SMA: Patent without evidence of aneurysm, dissection, vasculitis or significant stenosis. Renals: High-grade (near occlusion) stenosis of the a left renal artery origin. Less than 50% stenosis of the right renal artery origin. Normal vascular morphology. Relatively diminutive caliber of the left renal arterial vasculature distal the stenosis may relate to under filling. No aneurysm or dissection. IMA: Occluded at its origin with reconstitution via the marginal artery. 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 Focal irregular minimum 50% stenosis of the left common iliac artery secondary to a short segment dissection flap within the proximal left common iliac artery. Right internal iliac artery is heavily diseased  and occluded at its origin with subsequent reconstitution. High-grade focal stenosis of the left internal iliac artery at its origin. Proximal Outflow: Bilateral common femoral and visualized portions of the superficial and profunda femoral arteries are patent without evidence of aneurysm, dissection, vasculitis or significant stenosis. Veins: Mesenteric, splenic, portal venous, and hepatic venous structures are patent. Iliofemoral veins are patent. Renal veins and inferior vena cava are patent. Review of  the MIP images confirms the above findings. NON-VASCULAR Lower chest: Small left and moderate right pleural effusions are present with bibasilar compressive atelectasis. Hepatobiliary: Cholelithiasis without pericholecystic inflammatory change. Liver unremarkable. No intra or extrahepatic biliary ductal dilation. Pancreas: Unremarkable Spleen: Unremarkable Adrenals/Urinary Tract: The adrenal glands are unremarkable. Moderate left renal cortical atrophy. Right kidney is normal in size. 6 mm plaque-like nonobstructing calculus noted within the upper pole the right kidney. Multiple simple cortical cysts are seen within the right kidney. Hypodense 16 mm lesion is seen within the interpolar region of the right kidney demonstrates no significant enhancement is compatible with a Bosniak class 2 cyst. No follow-up imaging is recommended for these lesions. No hydronephrosis. The bladder is unremarkable. Stomach/Bowel: There is mild free intraperitoneal gas present. The exact site of visceral perforation is not clearly identified on this examination, however, there is extraluminal gas and debris surrounding the first and proximal second portion of the duodenum raising the question of a perforation in this location. Endoscopic clip noted within the juncture of the first and second portion of the duodenum. Moderate free intraperitoneal fluid. Endoscopic clip laying dependently within the cecum. The stomach, small bowel, and large bowel are otherwise unremarkable. No active gastrointestinal hemorrhage identified. Fluid within the descending colon and rectal vault may present clinically as diarrhea. Lymphatic: No pathologic adenopathy within the abdomen and pelvis. Reproductive: Involuted fibroid within the uterus. The pelvic organs are otherwise unremarkable. Other: Moderate subcutaneous body wall edema noted dependently. Musculoskeletal: No acute bone abnormality. No lytic or blastic bone lesion. IMPRESSION: VASCULAR . 1.  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 2. 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 Focal irregular minimum 50% stenosis of the left common iliac artery secondary to a short segment dissection flap. 3. 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 High-grade focal stenosis of the left internal iliac artery at its origin. 4. 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 NON-VASCULAR . 1. Moderate free intraperitoneal gas and fluid in keeping with visceral perforation. The exact site is not clearly identified on this examination, however, more focal debris and gas adjacent to the juncture of the first and second portion of the duodenum raises the question of a perforation in this location. 2. No active gastrointestinal hemorrhage identified 3. Cholelithiasis. 4. Mild right nonobstructing nephrolithiasis. 5. Fluid within the descending colon and rectal vault may present clinically as diarrhea. Aortic Atherosclerosis (ICD10-I70.0). Electronically Signed   By: Fidela Salisbury M.D.   On: 05/27/2022 21:51   DG CHEST PORT 1 VIEW  Result Date: 05/25/2022 CLINICAL DATA:  Chest pain EXAM: PORTABLE CHEST 1 VIEW COMPARISON:  05/22/2022 FINDINGS: Right internal jugular approach hemodialysis catheter terminates at the level of the right atrium. Stable heart size. Small right pleural effusion with associated right basilar opacity, similar to prior. Similar prominent interstitial markings bilaterally. No pneumothorax. IMPRESSION: 1. Small right pleural effusion with associated right basilar opacity, similar  to prior. 2. Similar prominent interstitial markings bilaterally, likely mild edema. Electronically Signed   By: Davina Poke D.O.   On: 05/25/2022 11:39   DG Abd 1 View  Result Date: 05/25/2022 CLINICAL  DATA:  Abdominal pain EXAM: ABDOMEN - 1 VIEW COMPARISON:  05/22/2022 FINDINGS: The bowel gas pattern is normal. No radio-opaque calculi or other significant radiographic abnormality are seen. Surgical clips overlie the pelvis and right upper quadrant. Calcification consistent with fibroid noted. IMPRESSION: Unremarkable bowel gas pattern. Calcified fibroid incidentally noted Electronically Signed   By: Sammie Bench M.D.   On: 05/25/2022 08:22   IR Fluoro Guide CV Line Right  Result Date: 05/24/2022 INDICATION: 72 year old female in need of durable dialysis access. She presents for removal temporary dialysis catheter in placement of a tunneled dialysis catheter. EXAM: TUNNELED CENTRAL VENOUS HEMODIALYSIS CATHETER PLACEMENT WITH ULTRASOUND AND FLUOROSCOPIC GUIDANCE MEDICATIONS: 2 g Ancef. The antibiotic was given in an appropriate time interval prior to skin puncture. ANESTHESIA/SEDATION: Moderate (conscious) sedation was employed during this procedure. A total of Versed 1 mg and Fentanyl 25 mcg was administered intravenously. Moderate Sedation Time: 11 minutes. The patient's level of consciousness and vital signs were monitored continuously by radiology nursing throughout the procedure under my direct supervision. FLUOROSCOPY TIME:  Radiation exposure index: 1 mGy reference air kerma COMPLICATIONS: None immediate. PROCEDURE: Informed written consent was obtained from the patient after a discussion of the risks, benefits, and alternatives to treatment. Questions regarding the procedure were encouraged and answered. The right neck and chest were prepped with chlorhexidine in a sterile fashion, and a sterile drape was applied covering the operative field. Maximum barrier sterile technique with sterile gowns and gloves were used for the procedure. A timeout was performed prior to the initiation of the procedure. After creating a small venotomy incision, a micropuncture kit was utilized to access the right  internal jugular vein under direct, real-time ultrasound guidance after the overlying soft tissues were anesthetized with 1% lidocaine with epinephrine. Ultrasound image documentation was performed. The microwire was kinked to measure appropriate catheter length. A stiff Glidewire was advanced to the level of the IVC and the micropuncture sheath was exchanged for a peel-away sheath. A palindrome tunneled hemodialysis catheter measuring 19 cm from tip to cuff was tunneled in a retrograde fashion from the anterior chest wall to the venotomy incision. The catheter was then placed through the peel-away sheath with tips ultimately positioned within the superior aspect of the right atrium. Final catheter positioning was confirmed and documented with a spot radiographic image. The catheter aspirates and flushes normally. The catheter was flushed with appropriate volume heparin dwells. The catheter exit site was secured with a 0-Prolene retention suture. The venotomy incision was closed with an interrupted 4-0 Vicryl, Dermabond and Steri-strips. Dressings were applied. The patient tolerated the procedure well without immediate post procedural complication. IMPRESSION: Successful placement of 19 cm tip to cuff tunneled hemodialysis catheter via the right internal jugular vein with tips terminating within the superior aspect of the right atrium. The catheter is ready for immediate use. Electronically Signed   By: Jacqulynn Cadet M.D.   On: 05/24/2022 12:39   IR US Guide Vasc Access Right  Result Date: 05/24/2022 INDICATION: 72 year old female in need of durable dialysis access. She presents for removal temporary dialysis catheter in placement of a tunneled dialysis catheter. EXAM: TUNNELED CENTRAL VENOUS HEMODIALYSIS CATHETER PLACEMENT WITH ULTRASOUND AND FLUOROSCOPIC GUIDANCE MEDICATIONS: 2 g Ancef. The antibiotic was given in an appropriate time interval prior  to skin puncture. ANESTHESIA/SEDATION: Moderate  (conscious) sedation was employed during this procedure. A total of Versed 1 mg and Fentanyl 25 mcg was administered intravenously. Moderate Sedation Time: 11 minutes. The patient's level of consciousness and vital signs were monitored continuously by radiology nursing throughout the procedure under my direct supervision. FLUOROSCOPY TIME:  Radiation exposure index: 1 mGy reference air kerma COMPLICATIONS: None immediate. PROCEDURE: Informed written consent was obtained from the patient after a discussion of the risks, benefits, and alternatives to treatment. Questions regarding the procedure were encouraged and answered. The right neck and chest were prepped with chlorhexidine in a sterile fashion, and a sterile drape was applied covering the operative field. Maximum barrier sterile technique with sterile gowns and gloves were used for the procedure. A timeout was performed prior to the initiation of the procedure. After creating a small venotomy incision, a micropuncture kit was utilized to access the right internal jugular vein under direct, real-time ultrasound guidance after the overlying soft tissues were anesthetized with 1% lidocaine with epinephrine. Ultrasound image documentation was performed. The microwire was kinked to measure appropriate catheter length. A stiff Glidewire was advanced to the level of the IVC and the micropuncture sheath was exchanged for a peel-away sheath. A palindrome tunneled hemodialysis catheter measuring 19 cm from tip to cuff was tunneled in a retrograde fashion from the anterior chest wall to the venotomy incision. The catheter was then placed through the peel-away sheath with tips ultimately positioned within the superior aspect of the right atrium. Final catheter positioning was confirmed and documented with a spot radiographic image. The catheter aspirates and flushes normally. The catheter was flushed with appropriate volume heparin dwells. The catheter exit site was  secured with a 0-Prolene retention suture. The venotomy incision was closed with an interrupted 4-0 Vicryl, Dermabond and Steri-strips. Dressings were applied. The patient tolerated the procedure well without immediate post procedural complication. IMPRESSION: Successful placement of 19 cm tip to cuff tunneled hemodialysis catheter via the right internal jugular vein with tips terminating within the superior aspect of the right atrium. The catheter is ready for immediate use. Electronically Signed   By: Jacqulynn Cadet M.D.   On: 05/24/2022 12:39   DG CHEST PORT 1 VIEW  Result Date: 05/22/2022 CLINICAL DATA:  Evaluate for intra-abdominal free air. EXAM: PORTABLE CHEST 1 VIEW COMPARISON:  Abdominal radiograph 05/22/2022 FINDINGS: Central venous catheter tip projects over the superior vena cava. Stable cardiac and mediastinal contours. Small layering right pleural effusion with underlying consolidation. No pneumothorax. No definite free air no definite gas identified underlying the hemidiaphragms. IMPRESSION: 1. Small layering right pleural effusion with underlying consolidation. 2. No definite free air identified underlying the hemidiaphragms. Consider further evaluation with decubitus imaging of the abdomen if there is persistent clinical concern given the limitations of this portable semi-erect chest radiograph. Electronically Signed   By: Lovey Newcomer M.D.   On: 05/22/2022 16:02   DG Abd 1 View  Result Date: 05/22/2022 CLINICAL DATA:  Postoperative abdominal pain. EXAM: ABDOMEN - 1 VIEW COMPARISON:  None Available. FINDINGS: Gas is demonstrated within nondilated loops of large and small bowel in a nonobstructed pattern. Supine evaluation limited for the detection of free intraperitoneal air. Lumbar spine degenerative changes. Calcific density projecting over the pelvis, nonspecific, potentially calcified uterine fibroid. IMPRESSION: Nonobstructed bowel-gas pattern. Electronically Signed   By: Lovey Newcomer  M.D.   On: 05/22/2022 14:08   DG CHEST PORT 1 VIEW  Result Date: 05/21/2022 CLINICAL DATA:  Leukocytosis EXAM:  PORTABLE CHEST 1 VIEW COMPARISON:  05/18/2022 FINDINGS: Central venous line tip in distal SVC. Normal cardiac silhouette. Small RIGHT effusion. Mild venous congestion. No pneumothorax. No focal consolidation. Lymphadenectomy clips in the LEFT axilla. No significant interval change. IMPRESSION: 1. No significant interval change. 2. Small RIGHT effusion. 3. Mild venous congestion. Electronically Signed   By: Suzy Bouchard M.D.   On: 05/21/2022 08:22   US BIOPSY (KIDNEY)  Result Date: 05/19/2022 INDICATION: 72 year old female with history of end-stage renal disease. EXAM: ULTRASOUND GUIDED RENAL BIOPSY COMPARISON:  None Available. MEDICATIONS: None. ANESTHESIA/SEDATION: Fentanyl 25 mcg IV; Versed 0.5 mg IV Total Moderate Sedation time: 10 minutes; The patient was continuously monitored during the procedure by the interventional radiology nurse under my direct supervision. COMPLICATIONS: None immediate. PROCEDURE: Informed written consent was obtained from the patient after a discussion of the risks, benefits and alternatives to treatment. The patient understands and consents the procedure. A timeout was performed prior to the initiation of the procedure. Ultrasound scanning was performed of the bilateral flanks. The inferior pole of the left kidney was selected for biopsy due to location and sonographic window. The procedure was planned. The operative site was prepped and draped in the usual sterile fashion. The overlying soft tissues were anesthetized with 1% lidocaine with epinephrine. A 17 gauge coaxial introducer needle was advanced into the inferior cortex of the left kidney and 2 core biopsies were obtained under direct ultrasound guidance with a 16 gauge core biopsy device. Images were saved for documentation purposes. The biopsy device was removed and hemostasis was obtained with manual  compression after injection of Gel-Foam slurry along the needle track under ultrasound guidance. Post procedural scanning was negative for significant post procedural hemorrhage or additional complication. A dressing was placed. The patient tolerated the procedure well without immediate post procedural complication. IMPRESSION: Technically successful ultrasound guided left renal biopsy. Ruthann Cancer, MD [facility] Radiology Electronically Signed   By: Ruthann Cancer M.D.   On: 05/19/2022 14:19   DG Chest Port 1 View  Result Date: 05/18/2022 CLINICAL DATA:  Central line placement. EXAM: PORTABLE CHEST 1 VIEW COMPARISON:  CT chest and chest x-ray from 2 days ago. FINDINGS: New right internal jugular central venous catheter with tip at the cavoatrial junction. The heart size and mediastinal contours are within normal limits. Mild diffuse interstitial thickening similar. Decreasing small right and unchanged small left pleural effusions. Increasing asymmetric density in the peripheral left upper lobe. Similar atelectasis in the peripheral right middle lobe. Improving right lower lobe atelectasis. No pneumothorax. No acute osseous abnormality. IMPRESSION: 1. New right internal jugular central venous catheter with tip at the cavoatrial junction. No pneumothorax. 2. Similar diffuse interstitial thickening with increasing asymmetric density in the peripheral left upper lobe. Differential considerations include infection or pulmonary edema. 3. Decreased small right and unchanged small left pleural effusions. Electronically Signed   By: Titus Dubin M.D.   On: 05/18/2022 14:06   US RENAL  Result Date: 05/18/2022 CLINICAL DATA:  Chronic kidney disease EXAM: RENAL / URINARY TRACT ULTRASOUND COMPLETE COMPARISON:  Abdomen CT report 01/13/2018 FINDINGS: Right Kidney: Renal measurements: 8.9 x 4.7 x 4.4 = volume: 97 mL. Kidney appears echogenic. No collecting system  dilatation. There is a simple cyst along the midportion of the kidney which is anechoic with through transmission measuring 17 mm. Left Kidney: Renal measurements: 7.5 x 3.9 x 3.2 = volume: 49 mL. Small echogenic appearing kidney. No collecting system dilatation. Anechoic rounded structure  seen in the mid kidney measuring 2 cm. Through transmission is smoothly marginated. Bladder: Appears normal for degree of bladder distention. Other: Scattered ascites.  Probable right-sided pleural effusion. IMPRESSION: Echogenic small appearing kidneys. No collecting system dilatation. Bilateral simple appearing renal cysts. Ascites.  Right pleural effusion Electronically Signed   By: Jill Side M.D.   On: 05/18/2022 11:07   CT Chest Wo Contrast  Result Date: 05/16/2022 CLINICAL DATA:  72 year old female with history of suspected pneumonia. EXAM: CT CHEST WITHOUT CONTRAST TECHNIQUE: Multidetector CT imaging of the chest was performed following the standard protocol without IV contrast. RADIATION DOSE REDUCTION: This exam was performed according to the departmental dose-optimization program which includes automated exposure control, adjustment of the mA and/or kV according to patient size and/or use of iterative reconstruction technique. COMPARISON:  Chest x-ray 05/16/2022. FINDINGS: Cardiovascular: Heart size is normal. There is no significant pericardial fluid, thickening or pericardial calcification. Aortic atherosclerosis. No definite coronary artery calcifications. Mild calcifications of the aortic valve. Mediastinum/Nodes: No pathologically enlarged mediastinal or hilar lymph nodes. Please note that accurate exclusion of hilar adenopathy is limited on noncontrast CT scans. Esophagus is unremarkable in appearance. No axillary lymphadenopathy. Lungs/Pleura: Moderate to large right and small to moderate left pleural effusions lying dependently associated with areas of passive subsegmental atelectasis in the lower lobes of  the lungs bilaterally. In addition, there are mid to upper lung predominant patchy areas of ground-glass attenuation and nodular appearing consolidative airspace disease, likely to reflect multilobar bilateral bronchopneumonia. The largest of these nodular appearing regions in the apex of the left upper lobe (axial image 32 of series 4) measuring 1.3 x 1.2 cm. Upper Abdomen: Aortic atherosclerosis. Tiny partially calcified gallstones lying dependently in the fundus of the gallbladder. Small volume of ascites. Multiple renal lesions bilaterally, incompletely characterized on today's noncontrast CT examination, most notable for an exophytic intermediate attenuation lesion (25 HU) measuring 1.9 cm in the anterior aspect of the interpolar region of the left kidney. A cluster of nonobstructive calculi is also noted in the posterior aspect of the upper pole collecting system of the right kidney measuring up to 3 mm. Musculoskeletal: There are no aggressive appearing lytic or blastic lesions noted in the visualized portions of the skeleton. IMPRESSION: 1. The appearance the chest is most suggestive of multilobar bilateral bronchopneumonia with bilateral parapneumonic pleural effusions, as above. 2. Given the nodular appearance of some of the areas of apparent airspace consolidation, follow-up noncontrast chest CT is recommended in 3 months to ensure resolution of the nodular areas following treatment for the patient's acute illness, to exclude underlying neoplasm. 3. Multiple renal lesions bilaterally, incompletely characterized on today's noncontrast CT examination, including an indeterminate 1.9 cm lesion in the anterior aspect of the interpolar region of the left kidney. Follow-up nonemergent outpatient abdominal MRI with and without IV gadolinium is recommended in the near future to definitively evaluate this lesion and exclude neoplasm. 4. Cholelithiasis. 5. Nonobstructive calculi in the right renal collecting system  measuring up to 3 mm. 6. Ascites. 7. Aortic atherosclerosis. Aortic Atherosclerosis (ICD10-I70.0). Electronically Signed   By: Vinnie Langton M.D.   On: 05/16/2022 05:23   DG Chest Port 1 View  Result Date: 05/16/2022 CLINICAL DATA:  Shortness of breath EXAM: PORTABLE CHEST 1 VIEW COMPARISON:  05/07/2022 FINDINGS: Cardiac shadow is enlarged but stable. Small pleural effusion on the right is again noted. Some increased basilar density is noted on the right when compared with the prior study. Postsurgical changes in the left  breast are seen. No bony abnormality is noted. IMPRESSION: Persistent right-sided effusion with slight increase in basilar infiltrate on the right. Electronically Signed   By: Inez Catalina M.D.   On: 05/16/2022 03:45    Microbiology: Results for orders placed or performed during the hospital encounter of 05/16/22  Resp panel by RT-PCR (RSV, Flu A&B, Covid) Anterior Nasal Swab     Status: None   Collection Time: 05/16/22  3:33 AM   Specimen: Anterior Nasal Swab  Result Value Ref Range Status   SARS Coronavirus 2 by RT PCR NEGATIVE NEGATIVE Final    Comment: (NOTE) SARS-CoV-2 target nucleic acids are NOT DETECTED.  The SARS-CoV-2 RNA is generally detectable in upper respiratory specimens during the acute phase of infection. The lowest concentration of SARS-CoV-2 viral copies this assay can detect is 138 copies/mL. A negative result does not preclude SARS-Cov-2 infection and should not be used as the sole basis for treatment or other patient management decisions. A negative result may occur with  improper specimen collection/handling, submission of specimen other than nasopharyngeal swab, presence of viral mutation(s) within the areas targeted by this assay, and inadequate number of viral copies(<138 copies/mL). A negative result must be combined with clinical observations, patient history, and epidemiological information. The expected result is Negative.  Fact Sheet  for Patients:  EntrepreneurPulse.com.au  Fact Sheet for Healthcare Providers:  IncredibleEmployment.be  This test is no t yet approved or cleared by the [place] FDA and  has been authorized for detection and/or diagnosis of SARS-CoV-2 by FDA under an Emergency Use Authorization (EUA). This EUA will remain  in effect (meaning this test can be used) for the duration of the COVID-19 declaration under Section 564(b)(1) of the Act, 21 U.S.C.section 360bbb-3(b)(1), unless the authorization is terminated  or revoked sooner.       Influenza A by PCR NEGATIVE NEGATIVE Final   Influenza B by PCR NEGATIVE NEGATIVE Final    Comment: (NOTE) The Xpert Xpress SARS-CoV-2/FLU/RSV plus assay is intended as an aid in the diagnosis of influenza from Nasopharyngeal swab specimens and should not be used as a sole basis for treatment. Nasal washings and aspirates are unacceptable for Xpert Xpress SARS-CoV-2/FLU/RSV testing.  Fact Sheet for Patients: EntrepreneurPulse.com.au  Fact Sheet for Healthcare Providers: IncredibleEmployment.be  This test is not yet approved or cleared by the [place] FDA and has been authorized for detection and/or diagnosis of SARS-CoV-2 by FDA under an Emergency Use Authorization (EUA). This EUA will remain in effect (meaning this test can be used) for the duration of the COVID-19 declaration under Section 564(b)(1) of the Act, 21 U.S.C. section 360bbb-3(b)(1), unless the authorization is terminated or revoked.     Resp Syncytial Virus by PCR NEGATIVE NEGATIVE Final    Comment: (NOTE) Fact Sheet for Patients: EntrepreneurPulse.com.au  Fact Sheet for Healthcare Providers: IncredibleEmployment.be  This test is not yet approved or cleared by the [place] FDA and has been authorized for detection and/or diagnosis of SARS-CoV-2 by FDA under an  Emergency Use Authorization (EUA). This EUA will remain in effect (meaning this test can be used) for the duration of the COVID-19 declaration under Section 564(b)(1) of the Act, 21 U.S.C. section 360bbb-3(b)(1), unless the authorization is terminated or revoked.  Performed at [facility], [address], [place], [place] [address]   Culture, blood (routine x 2) Call MD if unable to obtain prior to antibiotics being given     Status: None   Collection Time: 05/16/22  6:44  AM   Specimen: BLOOD RIGHT FOREARM  Result Value Ref Range Status   Specimen Description   Final    BLOOD RIGHT FOREARM BOTTLES DRAWN AEROBIC AND ANAEROBIC   Special Requests Blood Culture adequate volume  Final   Culture   Final    NO GROWTH 5 DAYS Performed at [facility], [address], [place], [place] [address]    Report Status 05/21/2022 FINAL  Final  Culture, blood (routine x 2) Call MD if unable to obtain prior to antibiotics being given     Status: None   Collection Time: 05/16/22  7:11 AM   Specimen: BLOOD LEFT ARM  Result Value Ref Range Status   Specimen Description BLOOD LEFT ARM BOTTLES DRAWN AEROBIC AND ANAEROBIC  Final   Special Requests Blood Culture adequate volume  Final   Culture   Final    NO GROWTH 5 DAYS Performed at [facility], [address], [place], [place] [address]    Report Status 05/21/2022 FINAL  Final  MRSA Next Gen by PCR, Nasal     Status: None   Collection Time: 05/16/22 12:10 PM   Specimen: Nasal Mucosa; Nasal Swab  Result Value Ref Range Status   MRSA by PCR Next Gen NOT DETECTED NOT DETECTED Final    Comment: (NOTE) The GeneXpert MRSA Assay (FDA approved for NASAL specimens only), is one component of a comprehensive MRSA colonization surveillance program. It is not intended to diagnose MRSA infection nor to guide or monitor treatment for MRSA infections. Test performance is not FDA approved in patients less than 48 years old. Performed at [facility], [address] [place], [place] [address]   C Difficile Quick Screen w PCR reflex     Status: None   Collection Time: 05/16/22 12:10 PM   Specimen: STOOL  Result Value Ref Range Status   C Diff antigen NEGATIVE NEGATIVE Final   C Diff toxin NEGATIVE NEGATIVE Final   C Diff interpretation No C. difficile detected.  Final    Comment: Performed at [facility], [address] [place], [place] [address]  Stool culture     Status: None   Collection Time: 05/16/22 12:10 PM   Specimen: Stool  Result Value Ref Range Status   Salmonella/Shigella Screen Final report  Final   Campylobacter Culture Final report  Final   E coli, Shiga toxin Assay Negative Negative Final    Comment: (NOTE) Performed At: [facility] [place], [place] [id number] Rush Farmer MD [id number]   Gastrointestinal Panel by PCR , Stool     Status: None   Collection Time: 05/16/22 12:10 PM   Specimen: Stool  Result Value Ref Range Status   Campylobacter species NOT DETECTED NOT DETECTED Final   Plesimonas shigelloides NOT DETECTED NOT DETECTED Final   Salmonella species NOT DETECTED NOT DETECTED Final   Yersinia enterocolitica NOT DETECTED NOT DETECTED Final   Vibrio species NOT DETECTED NOT DETECTED Final   Vibrio cholerae NOT DETECTED NOT DETECTED Final   Enteroaggregative E coli (EAEC) NOT DETECTED NOT DETECTED Final   Enteropathogenic E coli (EPEC) NOT DETECTED NOT DETECTED Final   Enterotoxigenic E coli (ETEC) NOT DETECTED NOT DETECTED Final   Shiga like toxin producing E coli (STEC) NOT DETECTED NOT DETECTED Final   Shigella/Enteroinvasive E coli (EIEC) NOT DETECTED NOT DETECTED Final   Cryptosporidium NOT DETECTED NOT DETECTED Final   Cyclospora cayetanensis NOT DETECTED NOT DETECTED Final   Entamoeba histolytica NOT DETECTED NOT DETECTED  Final   Giardia lamblia NOT DETECTED NOT DETECTED Final   Adenovirus F40/41 NOT DETECTED NOT DETECTED Final   Astrovirus NOT DETECTED NOT DETECTED Final    Norovirus GI/GII NOT DETECTED NOT DETECTED Final   Rotavirus A NOT DETECTED NOT DETECTED Final   Sapovirus (I, II, IV, and V) NOT DETECTED NOT DETECTED Final    Comment: Performed at [facility], [address] [place], [place] [address]  STOOL CULTURE REFLEX - RSASHR     Status: None   Collection Time: 05/16/22 12:10 PM  Result Value Ref Range Status   Stool Culture result 1 (RSASHR) Comment  Final    Comment: (NOTE) No Salmonella or Shigella recovered. Performed At: [facility] [address] [place], [place] [id number] Rush Farmer MD AS:[id number]   STOOL CULTURE Reflex - CMPCXR     Status: None   Collection Time: 05/16/22 12:10 PM  Result Value Ref Range Status   Stool Culture result 1 (CMPCXR) Comment  Final    Comment: (NOTE) No Campylobacter species isolated. Performed At: [facility] [place], [place] [id number] Rush Farmer MD [id number]   Aerobic/Anaerobic Culture w Gram Stain (surgical/deep wound)     Status: None   Collection Time: 05/27/22 11:45 PM   Specimen: PATH Cytology Peritoneal fluid; Body Fluid  Result Value Ref Range Status   Specimen Description   Final    PERITONEAL FLUID Performed at [facility], [address], [place], [place] [address]    Special Requests   Final    NONE Performed at [facility], [address], [place], [place] [address]    Gram Stain   Final    RARE WBC PRESENT, PREDOMINANTLY PMN NO ORGANISMS SEEN    Culture   Final    FEW ENTEROCOCCUS FAECIUM VANCOMYCIN RESISTANT ENTEROCOCCUS ISOLATED NO ANAEROBES ISOLATED Performed at [facility], [place] [address] [place], [place] [address]    Report Status 06/02/2022 FINAL  Final   Organism ID, Bacteria ENTEROCOCCUS FAECIUM  Final      Susceptibility   Enterococcus faecium - MIC*    AMPICILLIN >=32 RESISTANT Resistant     VANCOMYCIN >=32 RESISTANT Resistant     GENTAMICIN SYNERGY SENSITIVE Sensitive     LINEZOLID 2 SENSITIVE  Sensitive     * FEW ENTEROCOCCUS FAECIUM  Urine Culture (for pregnant, neutropenic or urologic patients or patients with an indwelling urinary catheter)     Status: Abnormal   Collection Time: 05/28/22 10:05 AM   Specimen: Urine, Catheterized  Result Value Ref Range Status   Specimen Description   Final    URINE, CATHETERIZED Performed at [facility], [address], [place], [place] [address]    Special Requests   Final    NONE Performed at [facility], [address], [place], [place] [address]    Culture (A)  Final    >=100,000 COLONIES/mL ENTEROCOCCUS FAECIUM VANCOMYCIN RESISTANT ENTEROCOCCUS ISOLATED    Report Status 05/30/2022 FINAL  Final   Organism ID, Bacteria ENTEROCOCCUS FAECIUM (A)  Final      Susceptibility   Enterococcus faecium - MIC*    AMPICILLIN >=32 RESISTANT Resistant     NITROFURANTOIN 64 INTERMEDIATE Intermediate     VANCOMYCIN >=32 RESISTANT Resistant     LINEZOLID 2 SENSITIVE Sensitive     * >=100,000 COLONIES/mL ENTEROCOCCUS FAECIUM    Labs: CBC: Recent Labs  Lab 05/30/22 0751 05/30/22 1117 06/01/22 0404 06/01/22 0700 06/02/22 0407 06/02/22 1110 06/04/22 0354 06/05/22 4481  WBC 17.8*  --  14.1*  --  13.4*  --  10.0 9.0  HGB 7.7*   < > 6.4* 5.5* 11.0* 12.5 10.1* 10.4*  HCT 24.5*   < > 20.6* 17.4* 34.6* 38.3 32.1* 32.4*  MCV 75.9*  --  76.6*  --  80.7  --  81.9 82.4  PLT 132*  --  126*  --  141*  --  141* 142*   < > = values in this interval not displayed.   Basic Metabolic Panel: Recent Labs  Lab 05/30/22 0500 05/31/22 0515 06/01/22 0404 06/01/22 0850 06/02/22 0407 06/03/22 0355 06/03/22 0356 06/04/22 0354 06/05/22 0433  NA 135 132*   < >  --  132*  130* 130* 132* 130* 132*  K 4.6 5.4*   < >  --  3.4*  3.4* 3.4* 3.5 3.9 4.1  CL 99 100   < >  --  99  97* 97* 97* 99 98  CO2 25 22   < >  --  23  20* '26 26 26 23  '$ GLUCOSE 146* 160*   < >  --  134*  133* 140* 140* 71 74  BUN 60* 87*   < >  --  79*  78* 57* 56* 72* 79*   CREATININE 1.88* 2.34*   < >  --  2.13*  2.20* 1.68* 1.72* 2.31* 2.88*  CALCIUM 6.7* 7.3*  7.0*   < >  --  7.1*  7.0* 6.9* 7.1* 6.9* 6.9*  MG 1.7 2.6*  --  1.8 1.8  --  1.8  --   --   PHOS 2.3* 3.8  3.7   < >  --  4.7* 2.8 2.8 3.5 5.5*   < > = values in this interval not displayed.   Liver Function Tests: Recent Labs  Lab 05/30/22 0500 05/31/22 0515 06/01/22 0404 06/02/22 0407 06/03/22 0355 06/03/22 0356 06/04/22 0354 06/05/22 0433  AST 17 32  --   --   --  61*  --   --   ALT 5 13  --   --   --  70*  --   --   ALKPHOS 61 77  --   --   --  138*  --   --   BILITOT 0.3  --   --   --   --  0.4  --   --   PROT 4.0* 4.3*  --   --   --  4.2*  --   --   ALBUMIN 1.7* 1.9*   < > 2.1* 2.0* 1.9* 1.9* 2.1*   < > = values in this interval not displayed.   CBG: Recent Labs  Lab 06/04/22 0002 06/04/22 0831 06/04/22 1633 06/05/22 0235 06/05/22 0834  GLUCAP 110* 89 150* 81 114*    Discharge time spent: greater than 30 minutes.  Signed: Barton Dubois, MD [facility] 06/05/2022

## 2022-06-05 NOTE — Progress Notes (Signed)
SBP elevated, other vitals stable. PT complained of pain in her abdomen, PRN dilaudid given. New PIV placed in R hand. +2 pitting edema assessed in bilateral lower extremities.

## 2022-06-05 NOTE — Progress Notes (Signed)
[phone]

## 2022-06-05 NOTE — TOC Transition Note (Signed)
Transition of Care [facility]) - CM/SW Discharge Note   Patient Details  Name: Sheri Cooper MRN: [id number] Date of Birth: 08-12-1950  [facility] of [facility]) CM/SW Contact:  Loreta Ave, [place] Phone Number: 06/05/2022, 10:53 AM   Clinical Narrative:     Patient will DC to: [facility]  Anticipated DC date: 06/05/22 Family notified: Brother, Hendricks Limes Transport by: EMS   Per MD patient ready for DC to [facility]. RN to call report prior to discharge [phone]. RN, patient, patient's family, and facility notified of DC. Discharge Summary and FL2 sent to facility. DC packet on chart. Ambulance transport requested for patient.   CSW will sign off for now as social work intervention is no longer needed. Please consult [place] again if new needs arise.       Barriers to Discharge: Continued Medical Work up   Patient Goals and CMS Choice CMS Medicare.gov Compare Post Acute Care list provided to:: Patient Choice offered to / list presented to : Patient  Discharge Placement                         Discharge Plan and Services Additional resources added to the After Visit Summary for   In-house Referral: Clinical Social Work   Post Acute Care Choice: [place]                               Social Determinants of Health ([place]) Interventions SDOH Screenings   Food Insecurity: No Food Insecurity (05/19/2022)  Housing: Low Risk  (05/19/2022)  Transportation Needs: No Transportation Needs (05/19/2022)  Utilities: Not At Risk (05/19/2022)  Tobacco Use: Medium Risk (06/01/2022)     Readmission Risk Interventions    05/18/2022   11:51 AM  Readmission Risk Prevention Plan  Transportation Screening Complete  PCP or Specialist Appt within 5-7 Days Not Complete  Home Care Screening Complete  Medication Review (RN CM) Complete

## 2022-06-05 NOTE — Progress Notes (Signed)
Patient being discharged today.  JP drain removed.  Patient knows that she will still have some clear fluid leaking from the hole until it seals.  She needs to change the dressing as needed.  Follow-up with Dr. Constance Haw as an outpatient for staple removal.

## 2022-06-05 NOTE — Progress Notes (Addendum)
Brief nephrology note: Received a call from Dr. Dyann Kief inquiring about discharge planning.  Apparently the patient has outpatient dialysis set up for AKI at [facility].  Reportedly, patient is clinically stable and her insurance is expiring today for SNF discharge. I noted that she has received multiple dialysis treatment in the hospital, last one was on 1/31st.  The creatinine level is trending up off of dialysis and urine output is only around 500-800 cc in 24 hours with diuretics.  I think it is safe for patient to discharge to SNF today with outpatient monitoring. Recommendation: Discharged with oral furosemide 80 mg daily Prednisone as planned previously Recommend to check renal panel before next dialysis to trend creatinine level. Dr. Dyann Kief will reach out to patient's nephrologist [place].  Katheran James, [facility].

## 2022-06-05 NOTE — Progress Notes (Signed)
Called report to [facility] rehab [place] RN.

## 2022-06-06 DIAGNOSIS — J449 Chronic obstructive pulmonary disease, unspecified: Secondary | ICD-10-CM | POA: Diagnosis not present

## 2022-06-06 DIAGNOSIS — I10 Essential (primary) hypertension: Secondary | ICD-10-CM | POA: Diagnosis not present

## 2022-06-06 DIAGNOSIS — M06041 Rheumatoid arthritis without rheumatoid factor, right hand: Secondary | ICD-10-CM | POA: Diagnosis not present

## 2022-06-06 DIAGNOSIS — L89152 Pressure ulcer of sacral region, stage 2: Secondary | ICD-10-CM | POA: Diagnosis not present

## 2022-06-06 DIAGNOSIS — N186 End stage renal disease: Secondary | ICD-10-CM | POA: Diagnosis not present

## 2022-06-08 DIAGNOSIS — N179 Acute kidney failure, unspecified: Secondary | ICD-10-CM | POA: Diagnosis not present

## 2022-06-08 DIAGNOSIS — N189 Chronic kidney disease, unspecified: Secondary | ICD-10-CM | POA: Diagnosis not present

## 2022-06-10 ENCOUNTER — Other Ambulatory Visit: Payer: Self-pay | Admitting: *Deleted

## 2022-06-10 ENCOUNTER — Encounter: Payer: Medicare Other | Admitting: General Surgery

## 2022-06-10 DIAGNOSIS — N184 Chronic kidney disease, stage 4 (severe): Secondary | ICD-10-CM | POA: Diagnosis not present

## 2022-06-10 DIAGNOSIS — I10 Essential (primary) hypertension: Secondary | ICD-10-CM | POA: Diagnosis not present

## 2022-06-10 DIAGNOSIS — D649 Anemia, unspecified: Secondary | ICD-10-CM | POA: Diagnosis not present

## 2022-06-10 DIAGNOSIS — I700 Atherosclerosis of aorta: Secondary | ICD-10-CM | POA: Diagnosis not present

## 2022-06-10 DIAGNOSIS — E7849 Other hyperlipidemia: Secondary | ICD-10-CM | POA: Diagnosis not present

## 2022-06-10 DIAGNOSIS — N179 Acute kidney failure, unspecified: Secondary | ICD-10-CM | POA: Diagnosis not present

## 2022-06-10 DIAGNOSIS — J449 Chronic obstructive pulmonary disease, unspecified: Secondary | ICD-10-CM | POA: Diagnosis not present

## 2022-06-10 DIAGNOSIS — Z0001 Encounter for general adult medical examination with abnormal findings: Secondary | ICD-10-CM | POA: Diagnosis not present

## 2022-06-10 DIAGNOSIS — N189 Chronic kidney disease, unspecified: Secondary | ICD-10-CM | POA: Diagnosis not present

## 2022-06-10 NOTE — Patient Outreach (Signed)
Sheri Cooper recently admitted to [facility].  Screening for potential [facility] care coordination services as benefit of health plan and PCP.   Collaboration with Melanie, [place] social worker to make aware Probation officer is following for transition plans and potential [facility] needs.   Marthenia Rolling, MSN, RN,BSN [place] Acute Care Coordinator [phone] (Direct dial)

## 2022-06-12 DIAGNOSIS — I10 Essential (primary) hypertension: Secondary | ICD-10-CM | POA: Diagnosis not present

## 2022-06-12 DIAGNOSIS — N189 Chronic kidney disease, unspecified: Secondary | ICD-10-CM | POA: Diagnosis not present

## 2022-06-12 DIAGNOSIS — M06041 Rheumatoid arthritis without rheumatoid factor, right hand: Secondary | ICD-10-CM | POA: Diagnosis not present

## 2022-06-12 DIAGNOSIS — N179 Acute kidney failure, unspecified: Secondary | ICD-10-CM | POA: Diagnosis not present

## 2022-06-12 DIAGNOSIS — J449 Chronic obstructive pulmonary disease, unspecified: Secondary | ICD-10-CM | POA: Diagnosis not present

## 2022-06-12 DIAGNOSIS — L89152 Pressure ulcer of sacral region, stage 2: Secondary | ICD-10-CM | POA: Diagnosis not present

## 2022-06-15 DIAGNOSIS — N179 Acute kidney failure, unspecified: Secondary | ICD-10-CM | POA: Diagnosis not present

## 2022-06-15 DIAGNOSIS — N189 Chronic kidney disease, unspecified: Secondary | ICD-10-CM | POA: Diagnosis not present

## 2022-06-15 NOTE — Progress Notes (Unsigned)
Referring Provider: [facility] Primary Care Physician:  [facility] Primary GI Physician: Dr. Marland Kitchen  No chief complaint on file.   HPI:   Sheri Cooper is a 72 y.o. female with a history of COPD, RA, CKD Stage IV presenting today for hospital follow-up.   She was admitted 1/14-2/3 with pneumonia and worsening renal function after recent discharge from OSH on 05/12/22. GI was consulted for further evaluation of worsening anemia and rectal bleeding. Colonoscopy was unrevealing other than blood in the terminal ileum and colon. On EGD, she was found to have  peptic ulcer disease with 2 nonbleeding cratered gastric ulcers, multiple duodenal ulcers, 2 of which had adherent clots that were removed and revealed 2 vessels.  1 vessel was clipped, cauterized, and treated with epinephrine injection.  Second vessel was clipped and treated with cautery, clip fell off.  She later developed epigastric abdominal pain, found to have perforated duodenal ulcer and underwent emergent exploratory laparotomy with graham patch and JP drain placement on 1/26. She received a total of 9 units PRBCs during admission and hemoglobin was 10.4 on day of discharge.    The etiology of her peptic ulcer disease is not clear she denied NSAIDs.  Fasting gastrin normal. She was treated with PPI with recommendations for twice daily dosing at discharge for at least 3 months as well as empiric H. pylori treatment as an outpatient.    Continued surveillance EGD later this year Needs H. pylori treatment  Past Medical History:  Diagnosis Date   Chronic kidney disease (CKD), stage IV (severe) (HCC)    COPD (chronic obstructive pulmonary disease) ([place])    Rheumatoid arthritis ([place])     Past Surgical History:  Procedure Laterality Date   CENTRAL VENOUS CATHETER INSERTION Left 05/27/2022   Procedure: INSERTION CENTRAL LINE ADULT;  Surgeon: Virl Cagey, MD;  Location: [facility] ORS;  Service:  General;  Laterality: Left;   COLONOSCOPY WITH PROPOFOL N/A 05/22/2022   Procedure: COLONOSCOPY WITH PROPOFOL;  Surgeon: Harvel Quale, MD;  Location: [facility] END[facility] SUITE;  Service: Gastroenterology;  Laterality: N/A;   ESOPHAGOGASTRODUODENOSCOPY (EGD) WITH PROPOFOL N/A 05/22/2022   Procedure: ESOPHAGOGASTRODUODENOSCOPY (EGD) WITH PROPOFOL;  Surgeon: Harvel Quale, MD;  Location: [facility];  Service: Gastroenterology;  Laterality: N/A;   IR FLUORO GUIDE CV LINE RIGHT  05/24/2022   IR US GUIDE VASC ACCESS RIGHT  05/24/2022   LAPAROTOMY N/A 05/27/2022   Procedure: EXPLORATORY LAPAROTOMY, OMENTAL PATH, DRAIN PLACEMENT;  Surgeon: Virl Cagey, MD;  Location: [facility] ORS;  Service: General;  Laterality: N/A;   VOLVULUS REDUCTION     colon resection    Current Outpatient Medications  Medication Sig Dispense Refill   amLODipine (NORVASC) 5 MG tablet Take 1 tablet (5 mg total) by mouth daily.     furosemide (LASIX) 40 MG tablet Take 2 tablets (80 mg total) by mouth daily.     hydrALAZINE (APRESOLINE) 100 MG tablet Take 0.5 tablets (50 mg total) by mouth 2 (two) times daily.     metoprolol succinate (TOPROL-XL) 50 MG 24 hr tablet Take 0.5 tablets (25 mg total) by mouth daily.     nystatin (MYCOSTATIN/NYSTOP) powder Apply topically 2 (two) times daily. Apply to affected area (under breast skin and skin folds) 15 g 0   oxyCODONE (ROXICODONE) 5 MG immediate release tablet Take 1-2 tablets (5-10 mg total) by mouth every 8 (eight) hours as needed for severe pain. 20 tablet 0   pantoprazole (PROTONIX)  40 MG tablet Take 1 tablet (40 mg total) by mouth 2 (two) times daily. 60 tablet 1   predniSONE (DELTASONE) 20 MG tablet Take 3 tablets (60 mg total) by mouth daily.     rosuvastatin (CRESTOR) 20 MG tablet Take 20 mg by mouth daily.     sertraline (ZOLOFT) 50 MG tablet Take 1 tablet by mouth daily.     No current facility-administered medications for this visit.    Allergies as of  06/16/2022   (No Known Allergies)    No family history on file.  Social History   Socioeconomic History   Marital status: Unknown    Spouse name: Not on file   Number of children: Not on file   Years of education: Not on file   Highest education level: Not on file  Occupational History   Not on file  Tobacco Use   Smoking status: Former    Types: Cigarettes   Smokeless tobacco: Never  Substance and Sexual Activity   Alcohol use: Not on file   Drug use: Never   Sexual activity: Not on file  Other Topics Concern   Not on file  Social History Narrative   Not on file   Social Determinants of Health   Financial Resource Strain: Not on file  Food Insecurity: No Food Insecurity (05/19/2022)   Hunger Vital Sign    Worried About Running Out of Food in the Last Year: Never true    Ran Out of Food in the Last Year: Never true  Transportation Needs: No Transportation Needs (05/19/2022)   PRAPARE - Hydrologist (Medical): No    Lack of Transportation (Non-Medical): No  Physical Activity: Not on file  Stress: Not on file  Social Connections: Not on file    Review of Systems: Gen: Denies fever, chills, anorexia. Denies fatigue, weakness, weight loss.  CV: Denies chest pain, palpitations, syncope, peripheral edema, and claudication. Resp: Denies dyspnea at rest, cough, wheezing, coughing up blood, and pleurisy. GI: Denies vomiting blood, jaundice, and fecal incontinence.   Denies dysphagia or odynophagia. Derm: Denies rash, itching, dry skin Psych: Denies depression, anxiety, memory loss, confusion. No homicidal or suicidal ideation.  Heme: Denies bruising, bleeding, and enlarged lymph nodes.  Physical Exam: There were no vitals taken for this visit. General:   Alert and oriented. No distress noted. Pleasant and cooperative.  Head:  Normocephalic and atraumatic. Eyes:  Conjuctiva clear without scleral icterus. Heart:  S1, S2 present without murmurs  appreciated. Lungs:  Clear to auscultation bilaterally. No wheezes, rales, or rhonchi. No distress.  Abdomen:  +BS, soft, non-tender and non-distended. No rebound or guarding. No HSM or masses noted. Msk:  Symmetrical without gross deformities. Normal posture. Extremities:  Without edema. Neurologic:  Alert and  oriented x4 Psych:  Normal mood and affect.    Assessment:     Plan:  ***   Aliene Altes, PA-C [facility] 06/16/2022

## 2022-06-16 ENCOUNTER — Encounter: Payer: Self-pay | Admitting: General Surgery

## 2022-06-16 ENCOUNTER — Telehealth: Payer: Self-pay | Admitting: Gastroenterology

## 2022-06-16 ENCOUNTER — Ambulatory Visit (INDEPENDENT_AMBULATORY_CARE_PROVIDER_SITE_OTHER): Payer: Medicare Other | Admitting: Gastroenterology

## 2022-06-16 ENCOUNTER — Encounter: Payer: Self-pay | Admitting: Gastroenterology

## 2022-06-16 ENCOUNTER — Ambulatory Visit (INDEPENDENT_AMBULATORY_CARE_PROVIDER_SITE_OTHER): Payer: Medicare Other | Admitting: General Surgery

## 2022-06-16 VITALS — BP 150/69 | HR 74 | Temp 98.0°F | Resp 12 | Ht 59.0 in | Wt 134.0 lb

## 2022-06-16 VITALS — BP 131/81 | HR 70 | Temp 98.1°F | Ht <= 58 in | Wt 110.0 lb

## 2022-06-16 DIAGNOSIS — D649 Anemia, unspecified: Secondary | ICD-10-CM | POA: Diagnosis not present

## 2022-06-16 DIAGNOSIS — K279 Peptic ulcer, site unspecified, unspecified as acute or chronic, without hemorrhage or perforation: Secondary | ICD-10-CM

## 2022-06-16 DIAGNOSIS — K269 Duodenal ulcer, unspecified as acute or chronic, without hemorrhage or perforation: Secondary | ICD-10-CM

## 2022-06-16 DIAGNOSIS — K631 Perforation of intestine (nontraumatic): Secondary | ICD-10-CM

## 2022-06-16 NOTE — Telephone Encounter (Signed)
[place] S[place], H pylori treatment was discussed with Dr. Jenetta Downer.   We need patient to start the following:  Bismuth subsalicylate [id number] mg QID x 14 days.  Tetracycline 500 mg daily x 14 days. (Renally adjusted) Metronidazole 500 mg QID x 14 days.  Continue Pantoprazole 40 mg twice daily.   Please reach out to [facility] to determine how we need to go about getting patient started on these medications. If the correct pharmacy is in the chart, I can send electronically.

## 2022-06-16 NOTE — Patient Instructions (Signed)
No heavy lifting > 10 lbs, excessive bending, pushing, pulling, or squatting for 6-8 weeks after surgery.  Steristrips will peel off in the next 5-7 days. You can remove them once they are peeling off. It is ok to shower. Pat the area dry.  Continue with your medications from the gastroenterologist. Follow up with gastroenterology regarding your ulcers and repeat scope in the future.

## 2022-06-16 NOTE — Progress Notes (Signed)
[facility]  Doing well after Ex lap and patch of of duodenal ulcer perforation. Doing ok. Eating a pureed /soft diet.  On medications from Gi for the ulcers. On dialysis.  BP (!) 150/69   Pulse 74   Temp 98 F (36.7 C) (Oral)   Resp 12   Ht 4' 11"$  (1.499 m)   Wt 134 lb (60.8 kg)   SpO2 93%   BMI 27.06 kg/m  Midline healed, steristrip placed No erythema or drainage  Patient s/p Ex lap, patch of duodenal perforation. Doing well. Sounds like her weight s not recorded correctly and is morel likely 111 based on dialysis measurements, her aid is going to get this straight with the facility   No heavy lifting > 10 lbs, excessive bending, pushing, pulling, or squatting for 6-8 weeks after surgery. After 07/16/2022.  Steristrips will peel off in the next 5-7 days. You can remove them once they are peeling off. It is ok to shower. Pat the area dry.  Continue with your medications from the gastroenterologist. Follow up with gastroenterology regarding your ulcers and repeat scope in the future.   I am ok with your  diet increasing but will discuss with Aliene Altes, PA for GI and leave final decision to her.   Curlene Labrum, MD [facility] [address] [place], [place] [address] [phone] (office)

## 2022-06-16 NOTE — Patient Instructions (Signed)
We are requesting your recent labs from dialysis for review.  We will let you know if anything else is needed.  Continue Pantoprazole 40 mg twice daily.   We are treating you for H pylori.   You will need an upper endoscopy in 3 months to verify ulcer healing.  You may advance to a regular renal diet.  We will plan to follow-up with you in about 8 weeks to ensure you are still doing well and schedule your upper endoscopy at that time.  It was great to see you again today!  I am glad you are doing well!  Aliene Altes, PA-C [facility]

## 2022-06-17 ENCOUNTER — Encounter: Payer: Self-pay | Admitting: Gastroenterology

## 2022-06-17 DIAGNOSIS — N179 Acute kidney failure, unspecified: Secondary | ICD-10-CM | POA: Diagnosis not present

## 2022-06-17 DIAGNOSIS — N189 Chronic kidney disease, unspecified: Secondary | ICD-10-CM | POA: Diagnosis not present

## 2022-06-17 NOTE — Telephone Encounter (Signed)
Spoke to nurse at [facility] and she needs prescription faxed to nursing home.

## 2022-06-18 ENCOUNTER — Other Ambulatory Visit: Payer: Self-pay | Admitting: Gastroenterology

## 2022-06-18 ENCOUNTER — Other Ambulatory Visit: Payer: Self-pay | Admitting: *Deleted

## 2022-06-18 DIAGNOSIS — K279 Peptic ulcer, site unspecified, unspecified as acute or chronic, without hemorrhage or perforation: Secondary | ICD-10-CM

## 2022-06-18 MED ORDER — TETRACYCLINE HCL 500 MG PO TABS: 500.0000 mg | ORAL_TABLET | Freq: Every day | ORAL | 0 refills | Status: AC

## 2022-06-18 MED ORDER — METRONIDAZOLE 500 MG PO TABS: 500.0000 mg | ORAL_TABLET | Freq: Four times a day (QID) | ORAL | 0 refills | Status: AC

## 2022-06-18 MED ORDER — BISMUTH SUBSALICYLATE 262 MG PO CHEW: 524.0000 mg | CHEWABLE_TABLET | Freq: Four times a day (QID) | ORAL | 0 refills | Status: AC

## 2022-06-18 NOTE — Telephone Encounter (Signed)
Noted  

## 2022-06-18 NOTE — Telephone Encounter (Signed)
Spoke to nurse at [facility] and prescription was received. Informed to call if any questions.

## 2022-06-18 NOTE — Patient Outreach (Signed)
[facility] Post-Acute Care Coordinator follow up. Sheri Cooper resides in [facility]. Screening for potential [facility] care coordination services as benefit of health plan and PCP.   Update received from [place] indicating Sheri Cooper will stay with brother post SNF. She is new to diaylsis

## 2022-06-18 NOTE — Telephone Encounter (Signed)
Prescriptions have been printed and signed. Please fax to facility and verify with the nurse that this is sufficient.

## 2022-06-18 NOTE — Progress Notes (Signed)
Prescriptions printed for H pylori treatment and will be faxed to [facility].

## 2022-06-19 DIAGNOSIS — N179 Acute kidney failure, unspecified: Secondary | ICD-10-CM | POA: Diagnosis not present

## 2022-06-19 DIAGNOSIS — N189 Chronic kidney disease, unspecified: Secondary | ICD-10-CM | POA: Diagnosis not present

## 2022-06-21 NOTE — Patient Outreach (Signed)
Ms. Natividad resides in [facility] skilled nursing facility. Screening for potential [facility] care coordination services as benefit of health plan and PCP.   Collaboration with Threasa Beards, Education officer, museum. Ms. Tangonan plans to return home with her brother who is able to provide support. Ms. Largent recently started dialysis.   Will plan outreach to discuss [facility] services.   Marthenia Rolling, MSN, RN,BSN [place] Acute Care Coordinator [phone] (Direct dial)

## 2022-06-22 DIAGNOSIS — N189 Chronic kidney disease, unspecified: Secondary | ICD-10-CM | POA: Diagnosis not present

## 2022-06-22 DIAGNOSIS — N179 Acute kidney failure, unspecified: Secondary | ICD-10-CM | POA: Diagnosis not present

## 2022-06-24 DIAGNOSIS — N179 Acute kidney failure, unspecified: Secondary | ICD-10-CM | POA: Diagnosis not present

## 2022-06-24 DIAGNOSIS — L89152 Pressure ulcer of sacral region, stage 2: Secondary | ICD-10-CM | POA: Diagnosis not present

## 2022-06-24 DIAGNOSIS — I10 Essential (primary) hypertension: Secondary | ICD-10-CM | POA: Diagnosis not present

## 2022-06-24 DIAGNOSIS — J449 Chronic obstructive pulmonary disease, unspecified: Secondary | ICD-10-CM | POA: Diagnosis not present

## 2022-06-24 DIAGNOSIS — M06041 Rheumatoid arthritis without rheumatoid factor, right hand: Secondary | ICD-10-CM | POA: Diagnosis not present

## 2022-06-24 DIAGNOSIS — N189 Chronic kidney disease, unspecified: Secondary | ICD-10-CM | POA: Diagnosis not present

## 2022-06-26 DIAGNOSIS — N189 Chronic kidney disease, unspecified: Secondary | ICD-10-CM | POA: Diagnosis not present

## 2022-06-26 DIAGNOSIS — N179 Acute kidney failure, unspecified: Secondary | ICD-10-CM | POA: Diagnosis not present

## 2022-06-27 ENCOUNTER — Telehealth: Payer: Self-pay | Admitting: Gastroenterology

## 2022-06-27 NOTE — Telephone Encounter (Signed)
Received labs from [facility].  Hemoglobin 11.1 on 06/15/2022.  This has improved since hospital discharge.  No additional recommendations at this time.

## 2022-06-29 DIAGNOSIS — N179 Acute kidney failure, unspecified: Secondary | ICD-10-CM | POA: Diagnosis not present

## 2022-06-29 DIAGNOSIS — N189 Chronic kidney disease, unspecified: Secondary | ICD-10-CM | POA: Diagnosis not present

## 2022-07-01 DIAGNOSIS — N179 Acute kidney failure, unspecified: Secondary | ICD-10-CM | POA: Diagnosis not present

## 2022-07-01 DIAGNOSIS — N189 Chronic kidney disease, unspecified: Secondary | ICD-10-CM | POA: Diagnosis not present

## 2022-07-03 DIAGNOSIS — N189 Chronic kidney disease, unspecified: Secondary | ICD-10-CM | POA: Diagnosis not present

## 2022-07-03 DIAGNOSIS — N179 Acute kidney failure, unspecified: Secondary | ICD-10-CM | POA: Diagnosis not present

## 2022-07-06 DIAGNOSIS — N179 Acute kidney failure, unspecified: Secondary | ICD-10-CM | POA: Diagnosis not present

## 2022-07-06 DIAGNOSIS — N189 Chronic kidney disease, unspecified: Secondary | ICD-10-CM | POA: Diagnosis not present

## 2022-07-08 ENCOUNTER — Ambulatory Visit: Payer: Medicare Other | Admitting: Internal Medicine

## 2022-07-08 DIAGNOSIS — N179 Acute kidney failure, unspecified: Secondary | ICD-10-CM | POA: Diagnosis not present

## 2022-07-08 DIAGNOSIS — N189 Chronic kidney disease, unspecified: Secondary | ICD-10-CM | POA: Diagnosis not present

## 2022-07-09 DIAGNOSIS — N390 Urinary tract infection, site not specified: Secondary | ICD-10-CM | POA: Diagnosis not present

## 2022-07-09 DIAGNOSIS — M6281 Muscle weakness (generalized): Secondary | ICD-10-CM | POA: Diagnosis not present

## 2022-07-09 DIAGNOSIS — R2689 Other abnormalities of gait and mobility: Secondary | ICD-10-CM | POA: Diagnosis not present

## 2022-07-10 DIAGNOSIS — J449 Chronic obstructive pulmonary disease, unspecified: Secondary | ICD-10-CM | POA: Diagnosis not present

## 2022-07-10 DIAGNOSIS — N179 Acute kidney failure, unspecified: Secondary | ICD-10-CM | POA: Diagnosis not present

## 2022-07-10 DIAGNOSIS — I10 Essential (primary) hypertension: Secondary | ICD-10-CM | POA: Diagnosis not present

## 2022-07-10 DIAGNOSIS — M06041 Rheumatoid arthritis without rheumatoid factor, right hand: Secondary | ICD-10-CM | POA: Diagnosis not present

## 2022-07-10 DIAGNOSIS — N189 Chronic kidney disease, unspecified: Secondary | ICD-10-CM | POA: Diagnosis not present

## 2022-07-10 DIAGNOSIS — L89152 Pressure ulcer of sacral region, stage 2: Secondary | ICD-10-CM | POA: Diagnosis not present

## 2022-07-12 DIAGNOSIS — N390 Urinary tract infection, site not specified: Secondary | ICD-10-CM | POA: Diagnosis not present

## 2022-07-12 DIAGNOSIS — M6281 Muscle weakness (generalized): Secondary | ICD-10-CM | POA: Diagnosis not present

## 2022-07-12 DIAGNOSIS — R2689 Other abnormalities of gait and mobility: Secondary | ICD-10-CM | POA: Diagnosis not present

## 2022-07-13 DIAGNOSIS — N189 Chronic kidney disease, unspecified: Secondary | ICD-10-CM | POA: Diagnosis not present

## 2022-07-13 DIAGNOSIS — N179 Acute kidney failure, unspecified: Secondary | ICD-10-CM | POA: Diagnosis not present

## 2022-07-14 IMAGING — MG MM BREAST BX W LOC DEV 1ST LESION IMAGE BX SPEC STEREO GUIDE*L*
7 of 24 series · 7 of 40 positions shown · non-contrast
Comparison: Previous exams.
COMPARISON: Previous exams.

Addendum:
CLINICAL DATA: Left breast calcifications spanning 4.1 cm. Two
biopsies recommended for extent of disease.

EXAM:
RIGHT BREAST STEREOTACTIC CORE NEEDLE BIOPSY

[L (1 of 7)]
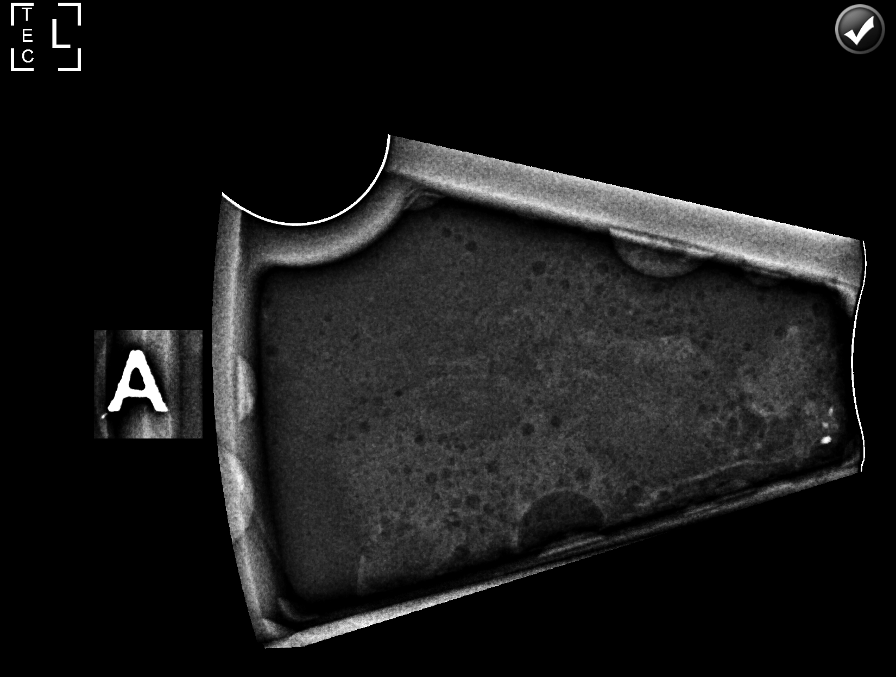

[L (2 of 7)]
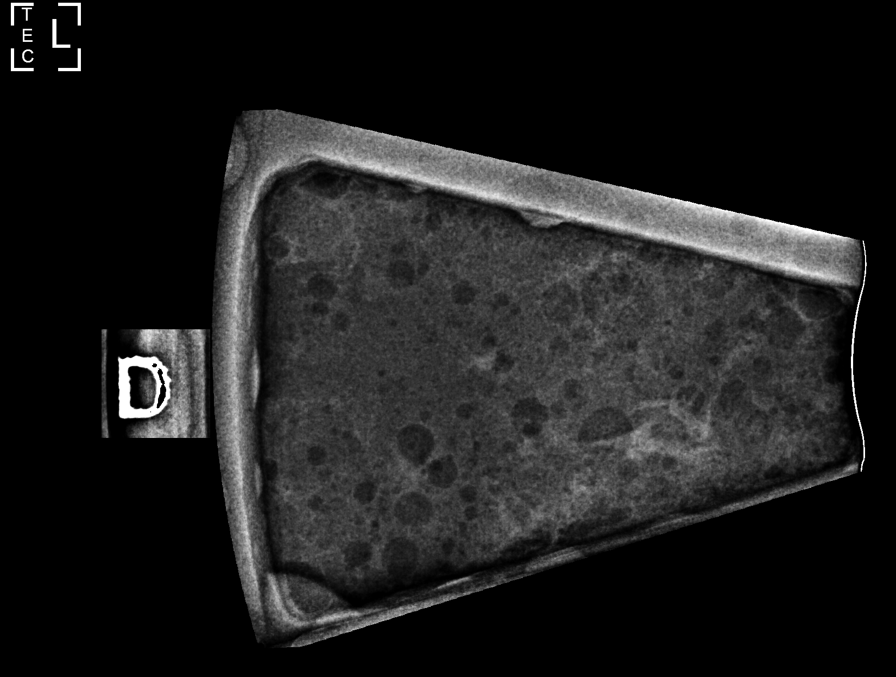

[L (3 of 7)]
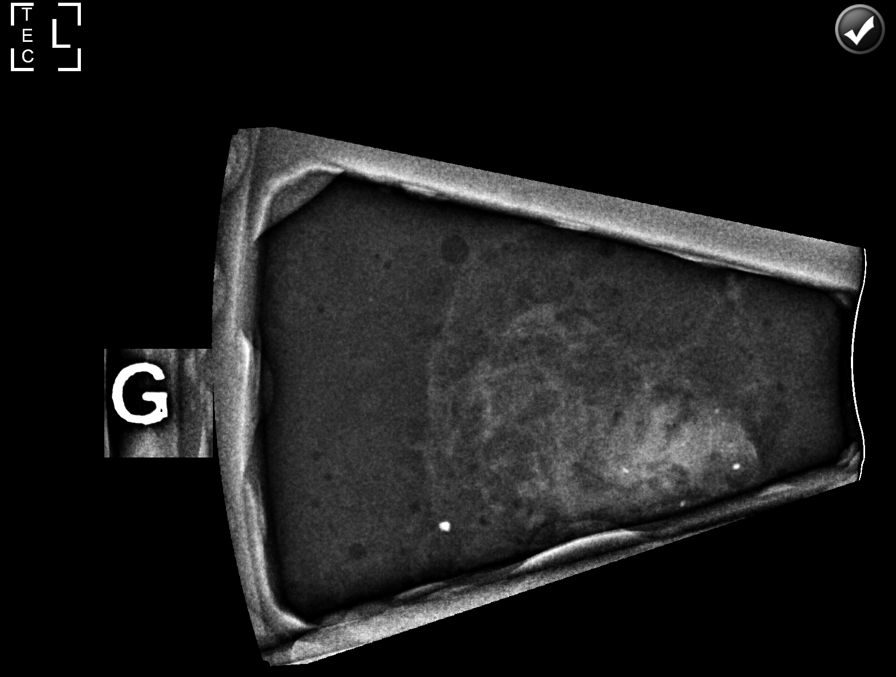

[L (4 of 7)]
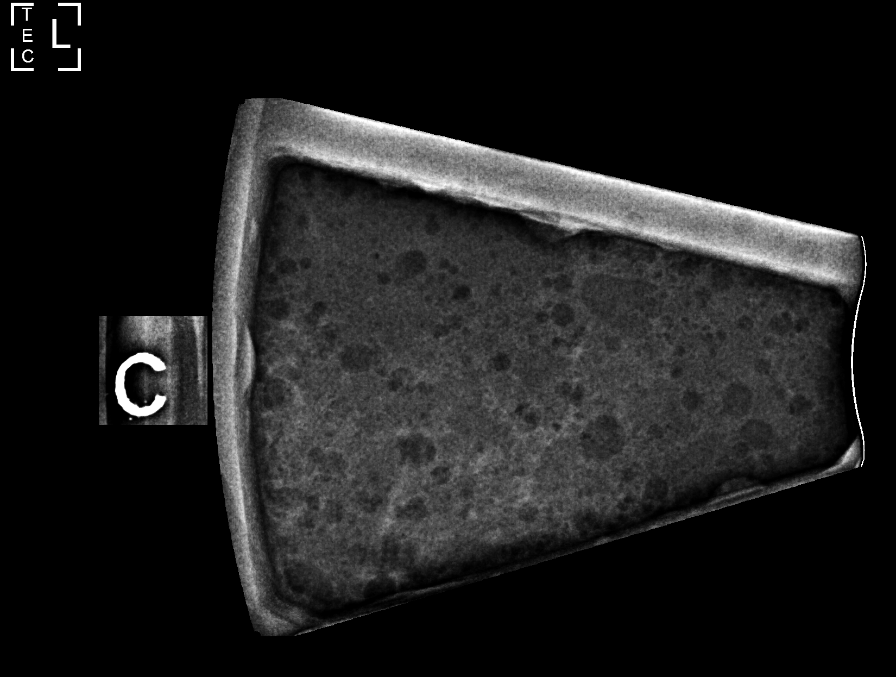

[L (5 of 7)]
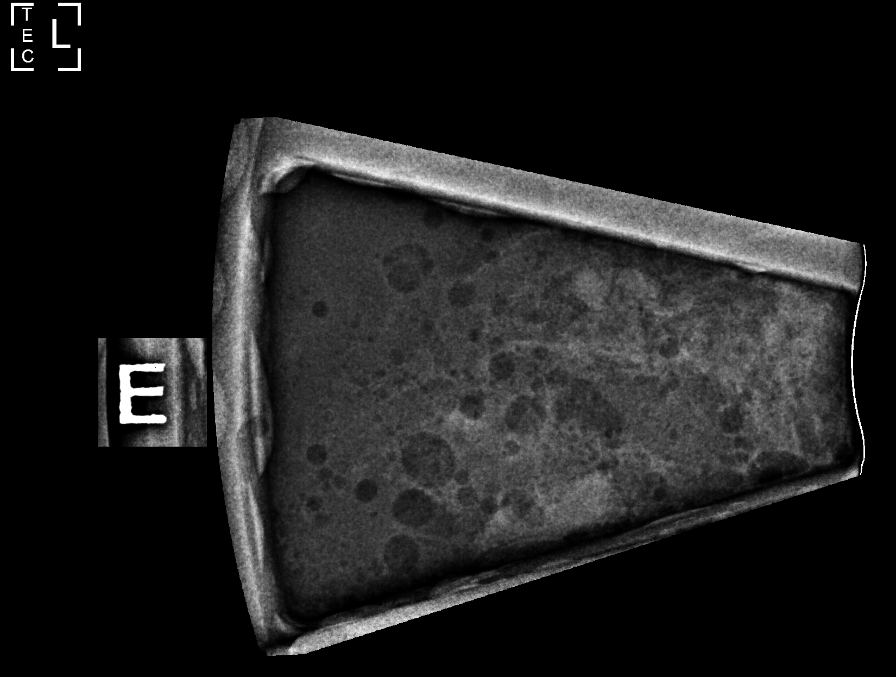

[L (6 of 7)]
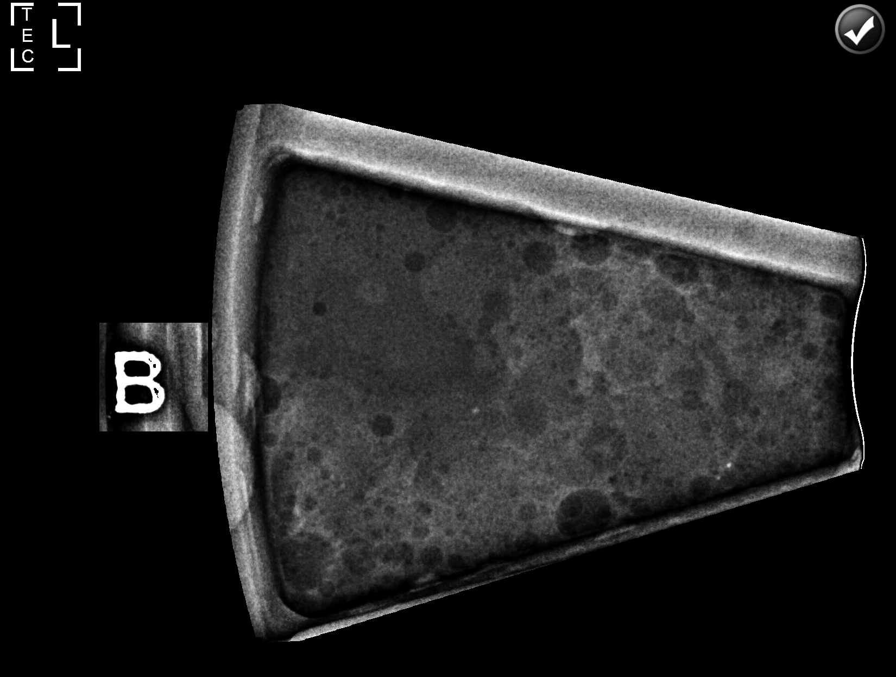

[L (7 of 7)]
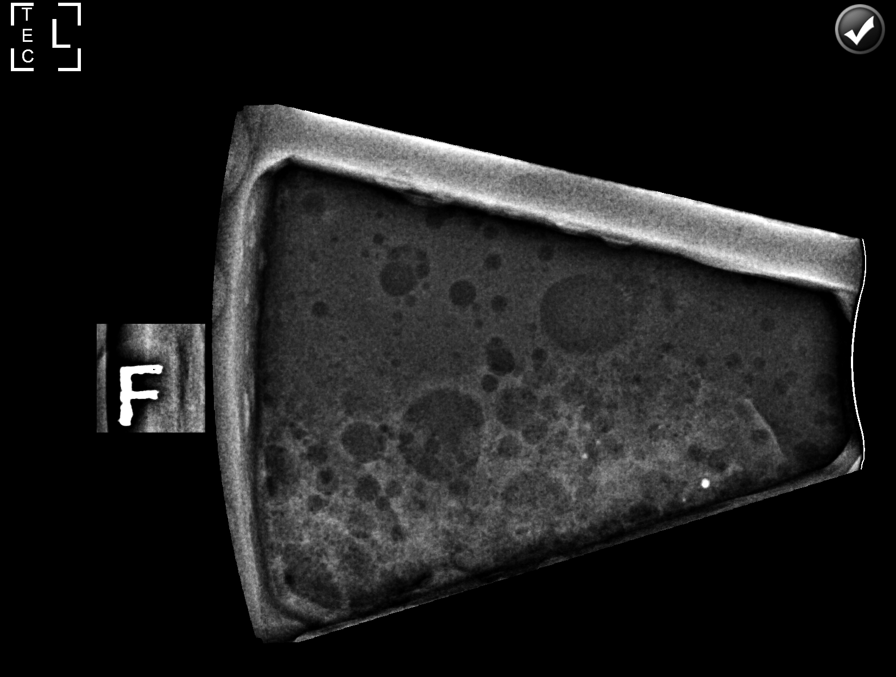

[7 of 40 positions shown; findings below may reference images not displayed]



Using sterile technique and 1% lidocaine and 1% lidocaine with
epinephrine as local anesthetic, under stereotactic guidance, a 9
gauge vacuum assisted device was used to perform core needle biopsy
of calcifications in the lower outer quadrant of the left breast
using a lateral to medial approach. Specimen radiograph was
performed showing calcifications are present in the tissue samples.
Specimens with calcifications are identified for pathology.

Lesion quadrant: Lower outer quadrant

At the conclusion of the procedure, X shaped tissue marker clip was
deployed into the biopsy cavity. Follow-up 2-view mammogram was
performed and dictated separately.

The patient and I discussed the procedure of stereotactic-guided
biopsy including benefits and alternatives. We discussed the high
likelihood of a successful procedure. We discussed the risks of the
procedure including infection, bleeding, tissue injury, clip
migration, and inadequate sampling. Informed written consent was
given. The usual time out protocol was performed immediately prior
to the procedure.

Using sterile technique and 1% lidocaine and 1% lidocaine with
epinephrine as local anesthetic, under stereotactic guidance, a 9
gauge vacuum assisted device was used to perform core needle biopsy
of calcifications in the lower outer quadrant of the left breast
using a inferior to superior approach. Specimen radiograph was
performed showing calcifications are present in the tissue samples.
Specimens with calcifications are identified for pathology.

Lesion quadrant: Lower outer quadrant

At the conclusion of the procedure, coil shaped tissue marker clip
was deployed into the biopsy cavity. Follow-up 2-view mammogram was
performed and dictated separately.
IMPRESSION: Stereotactic-guided biopsies of 2 areas of calcifications. No
apparent complications.

ADDENDUM:
Pathology revealed INTERMEDIATE- HIGH GRADE DUCTAL CARCINOMA IN SITU
WITH CALCIFICATIONS AND NECROSIS, FRAGMENT OF INTRADUCTAL PAPILLOMA
of the LEFT breast, lower outer quadrant (lateral). This was found
to be concordant by Dr. Jaylon Aujla.

Pathology revealed INTERMEDIATE- HIGH GRADE DUCTAL CARCINOMA IN SITU
WITH CALCIFICATIONS AND NECROSIS of the LEFT breast, lower inner
quadrant (medial). This was found to be concordant by Dr. Jim
Leonard.

Pathology results were discussed by telephone with [place] RN. Dr.
Shaine Obando of [HOSPITAL] [HOSPITAL] [facility]
arrange surgical referral for patient.

May need to biopsy more lateral calcifications and recommend an
inferior approach. If breast conservation is considered, recommend
attempt to biopsy additional calcifications in outer left breast to
define extent, as calcifications span greater than 4 cm and the 2
biopsy clips are less than 2 cm apart.

Pathology results reported by Yusuke Brito RN on 01/10/2020.



Using sterile technique and 1% lidocaine and 1% lidocaine with
epinephrine as local anesthetic, under stereotactic guidance, a 9
gauge vacuum assisted device was used to perform core needle biopsy
of calcifications in the lower outer quadrant of the left breast
using a lateral to medial approach. Specimen radiograph was
performed showing calcifications are present in the tissue samples.
Specimens with calcifications are identified for pathology.

Lesion quadrant: Lower outer quadrant

At the conclusion of the procedure, X shaped tissue marker clip was
deployed into the biopsy cavity. Follow-up 2-view mammogram was
performed and dictated separately.

The patient and I discussed the procedure of stereotactic-guided
biopsy including benefits and alternatives. We discussed the high
likelihood of a successful procedure. We discussed the risks of the
procedure including infection, bleeding, tissue injury, clip
migration, and inadequate sampling. Informed written consent was
given. The usual time out protocol was performed immediately prior
to the procedure.

Using sterile technique and 1% lidocaine and 1% lidocaine with
epinephrine as local anesthetic, under stereotactic guidance, a 9
gauge vacuum assisted device was used to perform core needle biopsy
of calcifications in the lower outer quadrant of the left breast
using a inferior to superior approach. Specimen radiograph was
performed showing calcifications are present in the tissue samples.
Specimens with calcifications are identified for pathology.

Lesion quadrant: Lower outer quadrant

At the conclusion of the procedure, coil shaped tissue marker clip
was deployed into the biopsy cavity. Follow-up 2-view mammogram was
performed and dictated separately.
IMPRESSION: Stereotactic-guided biopsies of 2 areas of calcifications. No
apparent complications.

## 2022-07-15 ENCOUNTER — Other Ambulatory Visit: Payer: Self-pay | Admitting: *Deleted

## 2022-07-15 DIAGNOSIS — Z992 Dependence on renal dialysis: Secondary | ICD-10-CM

## 2022-07-15 DIAGNOSIS — N189 Chronic kidney disease, unspecified: Secondary | ICD-10-CM | POA: Diagnosis not present

## 2022-07-15 DIAGNOSIS — N179 Acute kidney failure, unspecified: Secondary | ICD-10-CM | POA: Diagnosis not present

## 2022-07-15 NOTE — Patient Outreach (Addendum)
[place] Coordinator follow up. Verified in [facility] Ms. Kaminsky discharged from [place] on 07/14/22.   Message sent to [place] worker to inquire about home health agency arrangements. SNF social worker previously reported transition plan was to return home with brother.   Will refer to [facility] care coordination team.   Ms. Babers has medical history of COPD, rheumatoid arthritis, stage V CKD, new to dialysis, HTN.  Addendum: Update received from [place] social worker. [facility] home health was arranged for PT/OT/RN/SW/aide. DME ordered was bedside commode and wheelchair. Already had walker at home. Threasa Beards reports Ms. Lightle lost appeal and reconsideration. Ms. Noviello did not want to apply for Medicaid per [facility].   Per Eusebio Me [place] case manager appointment was scheduled for 07/21/22 at 1015 am with Dr. Quillian Quince.    Marthenia Rolling, MSN, RN,BSN [place] Acute Care Coordinator [phone] (Direct dial)

## 2022-07-16 ENCOUNTER — Telehealth: Payer: Self-pay | Admitting: *Deleted

## 2022-07-16 NOTE — Progress Notes (Signed)
  Care Coordination   Note   07/16/2022 Name: Sheri [facility] MRN: [id number] DOB: 12-22-1950  Sheri Cooper is a 73 y.o. year old female who sees Caryl Bis, MD for primary care. I reached out to [employer] by phone today to offer care coordination services.  Sheri Cooper was given information about Care Coordination services today including:   The Care Coordination services include support from the care team which includes your Nurse Coordinator, Clinical Social Worker, or Pharmacist.  The Care Coordination team is here to help remove barriers to the health concerns and goals most important to you. Care Coordination services are voluntary, and the patient may decline or stop services at any time by request to their care team member.   Care Coordination Consent Status: Patient agreed to services and verbal consent obtained.   Follow up plan:  Telephone appointment with care coordination team member scheduled for:  07/19/22  Encounter Outcome:  Pt. Scheduled  [place]  Direct Dial: [phone]

## 2022-07-17 DIAGNOSIS — N179 Acute kidney failure, unspecified: Secondary | ICD-10-CM | POA: Diagnosis not present

## 2022-07-17 DIAGNOSIS — N189 Chronic kidney disease, unspecified: Secondary | ICD-10-CM | POA: Diagnosis not present

## 2022-07-19 ENCOUNTER — Telehealth: Payer: Self-pay | Admitting: *Deleted

## 2022-07-19 ENCOUNTER — Encounter: Payer: Self-pay | Admitting: *Deleted

## 2022-07-19 ENCOUNTER — Ambulatory Visit: Payer: Self-pay | Admitting: *Deleted

## 2022-07-19 DIAGNOSIS — Z748 Other problems related to care provider dependency: Secondary | ICD-10-CM

## 2022-07-19 NOTE — Patient Outreach (Signed)
Care Coordination   Initial Visit Note   07/19/2022 Name: Sheri Cooper MRN: [id number] DOB: 05-26-1950  Sheri Ramus Mccormac is a 72 y.o. year old female who sees Caryl Bis, MD for primary care. I spoke with  [facility]ri [facility] by phone today.  What matters to the patients health and wellness today?  Arranging transportation to dialysis and obtaining wheelchair from [place]             This [place] (r/t recent hosp SNF discharge & dialysis)       Care Coordination Goals: Patient will work with [facility] re: transportation assistance to dialysis Patient will state understanding of how transportation assists works and the benefits she has through her insurance company and [facility] Patient will move carefully and change positions slowly to decrease risk for falls Patient will use assistive devices, walker & wheelchair, for ambulation Patient will utilize transportation assistance to dialysis that was arranged by [place] Patient will state understnding of need for wheelchair for transportation and need to have the ability to get out of her home and to the vehicle (patient confirmed understanding today) Patient will be delivered a wheelchair by [employer] but will continue using the one loaned by [facility] until she has received that one Patient will work with [facility] for nursing, PT, OT, and aide services Patient will work with [facility] Renal Coordinator in [place] as needed Patient will consider applying for Medicaid to assist with financial burden of dialysis, transportation, office visits, and meds Patient will talk with social worker at [facility] to see what assistance they can offer Patient will reach out to [place] Coordinator at [phone] for any care coordination or resource needs        SDOH assessments and interventions completed:  Yes  SDOH  Interventions Today    Flowsheet Row Most Recent Value  SDOH Interventions   Food Insecurity Interventions Intervention Not Indicated  Housing Interventions Intervention Not Indicated  Transportation Interventions AMB Referral  [transportation to dialysis needed]  Financial Strain Interventions Intervention Not Indicated        Care Coordination Interventions:  Yes, provided  Interventions Today    Flowsheet Row Most Recent Value  Chronic Disease   Chronic disease during today's visit Chronic Kidney Disease/End Stage Renal Disease (ESRD), Other  [[facility] after hospital dishcarge in Feb for pneumonia. ESRD on dialysis T, Th, Sat at 10:30 at [facility] Dialysis in [place] Interventions   General Interventions Discussed/Reviewed General Interventions Discussed, General Interventions Reviewed, Doctor Visits, Communication with, [employer], Museum/gallery conservator (DME)  Doctor Visits Discussed/Reviewed Doctor Visits Discussed, Doctor Visits Reviewed, PCP  Durable Medical Equipment (DME) Bed side commode, Environmental consultant, Wheelchair  [denies needing BSC that was ordered. Has not received wheelchair from [employer] but was allowed to borrow one from SNF until receipt. Not using walker at present.]  Wheelchair Standard  PCP/Specialist Visits Compliance with follow-up visit  Communication with RN  [Extensive conversations with Marthenia Rolling, RN (post acute care) & Bonnielee Haff (Care Guide) re: SNF discharge instructions, orders for DME, and transportation assistance needs to dialysis T, Th, Sat. Call to [facility] Re: [facility] confirmed & sched]  Exercise Interventions   Exercise Discussed/Reviewed Physical Activity, Assistive device use and maintanence  Physical Activity Discussed/Reviewed Physical Activity Reviewed, Physical Activity Discussed  Education Interventions   Education Provided Provided  Education  Provided Orthoptist On When to see the doctor, Other,  Mental Health/Coping with Illness, Office manager Interventions   Safety Discussed/Reviewed Fall Risk, Safety Discussed, Armed forces logistics/support/administrative officer, Contact home health agency, Refer for community resources  [facility] [phone]. Planned start of care today with nursing per [facility], intake specialist]      Per Bonnielee Haff, NT ([facility]), she has connected with Renal Coordinator in [place] to see if they can offer assistance. [facility] can't offer transportation on Sat and [place] [facility] [place] [facility] [place] makes it difficult to secure transportation especially since she needs a wheelchair lift. She does have transportation available through her insurance for 26 one way trip visits per year. [facility] has to apply for grant to get [facility] to transport her since she has not applied for Medicaid. Transportation has been arranged for dialysis on 07/22/22 as of my last conversation with Jeanine but transportation for tomorrow's session hadn't been secured yet. Per Marthenia Rolling, RN's conversation with [facility] Social Worker, [employer] did receive order for wheelchair and [facility]. [employer] declined to apply for Medicaid when offered at SNF. I will discuss LCSW referral during our next telephone visit.   Follow up plan: Follow up call scheduled for 07/23/22    Encounter Outcome:  Pt. Visit Completed   Chong Sicilian, BSN, RN-BC RN Care Coordinator [place]: [phone] Main #: [phone]

## 2022-07-19 NOTE — Telephone Encounter (Signed)
   Telephone encounter was:  Unsuccessful.  07/19/2022 Name: [facility]ri [facility] MRN: [id number] DOB: [birth year]  Unsuccessful outbound call made today to assist with:  Transportation Needs   Outreach Attempt:  1st Attempt Also alerted patient ride canceled by Paulo Fruit  A HIPAA compliant voice message was left requesting a return call.  Instructed patient to call back at [phone].  [place] [phone] [address] , [place] [address] Email : [email or web address]

## 2022-07-19 NOTE — Telephone Encounter (Signed)
   Telephone encounter was:  Successful.  07/19/2022 Name: Sheri [facility] MRN: [id number] DOB: 09/26/1950  Sheri [facility] is a 72 y.o. year old female who is a primary care patient of Quillian Quince Mitzie Na, MD . The community resource team was consulted for assistance with Transportation Needs   Care guide performed the following interventions: Patient provided with information about care guide support team and interviewed to confirm resource needs. called patient to schedule tranportation patient not elible for [facility] will be placed on the waitlist for Dialysis transportation , scheduled through thn transportation for 1st 2 visits will reach out to [facility] transport for future needs  Follow Up Plan:  Care guide will follow up with patient by phone over the next day  [place] [address] , [place] [address] Email : [email or web address]

## 2022-07-20 ENCOUNTER — Telehealth: Payer: Self-pay | Admitting: *Deleted

## 2022-07-20 DIAGNOSIS — N189 Chronic kidney disease, unspecified: Secondary | ICD-10-CM | POA: Diagnosis not present

## 2022-07-20 DIAGNOSIS — N179 Acute kidney failure, unspecified: Secondary | ICD-10-CM | POA: Diagnosis not present

## 2022-07-20 NOTE — Telephone Encounter (Signed)
   Telephone encounter was:  Successful.  07/20/2022 Name: Sheri [facility] MRN: [id number] DOB: 09/27/1950  [facility]ri [facility] is a 72 y.o. year old female who is a primary care patient of Quillian Quince Mitzie Na, MD . The community resource team was consulted for assistance with Transportation Needs   Care guide performed the following interventions: Patient provided with information about care guide support team and interviewed to confirm resource needs. patient has not accessed the insurance company but she says she will today for her sat appt , transportation arranged through [facility] for thursday treatment  Follow Up Plan:  No further follow up planned at this time. The patient has been provided with needed resources.  [place] [phone] [address] , [place] [address] Email : [email or web address] [email or web address]

## 2022-07-21 DIAGNOSIS — N051 Unspecified nephritic syndrome with focal and segmental glomerular lesions: Secondary | ICD-10-CM | POA: Diagnosis not present

## 2022-07-21 DIAGNOSIS — J449 Chronic obstructive pulmonary disease, unspecified: Secondary | ICD-10-CM | POA: Diagnosis not present

## 2022-07-21 DIAGNOSIS — E46 Unspecified protein-calorie malnutrition: Secondary | ICD-10-CM | POA: Diagnosis not present

## 2022-07-21 DIAGNOSIS — E785 Hyperlipidemia, unspecified: Secondary | ICD-10-CM | POA: Diagnosis not present

## 2022-07-21 DIAGNOSIS — L89312 Pressure ulcer of right buttock, stage 2: Secondary | ICD-10-CM | POA: Diagnosis not present

## 2022-07-21 DIAGNOSIS — Z992 Dependence on renal dialysis: Secondary | ICD-10-CM | POA: Diagnosis not present

## 2022-07-21 DIAGNOSIS — N186 End stage renal disease: Secondary | ICD-10-CM | POA: Diagnosis not present

## 2022-07-21 DIAGNOSIS — Z720 Tobacco use: Secondary | ICD-10-CM | POA: Diagnosis not present

## 2022-07-21 DIAGNOSIS — Z7952 Long term (current) use of systemic steroids: Secondary | ICD-10-CM | POA: Diagnosis not present

## 2022-07-21 DIAGNOSIS — I10 Essential (primary) hypertension: Secondary | ICD-10-CM | POA: Diagnosis not present

## 2022-07-21 DIAGNOSIS — Z87440 Personal history of urinary (tract) infections: Secondary | ICD-10-CM | POA: Diagnosis not present

## 2022-07-21 DIAGNOSIS — Z8711 Personal history of peptic ulcer disease: Secondary | ICD-10-CM | POA: Diagnosis not present

## 2022-07-21 DIAGNOSIS — D631 Anemia in chronic kidney disease: Secondary | ICD-10-CM | POA: Diagnosis not present

## 2022-07-21 DIAGNOSIS — Z6820 Body mass index (BMI) 20.0-20.9, adult: Secondary | ICD-10-CM | POA: Diagnosis not present

## 2022-07-21 DIAGNOSIS — M069 Rheumatoid arthritis, unspecified: Secondary | ICD-10-CM | POA: Diagnosis not present

## 2022-07-22 DIAGNOSIS — N189 Chronic kidney disease, unspecified: Secondary | ICD-10-CM | POA: Diagnosis not present

## 2022-07-22 DIAGNOSIS — N179 Acute kidney failure, unspecified: Secondary | ICD-10-CM | POA: Diagnosis not present

## 2022-07-23 ENCOUNTER — Ambulatory Visit: Payer: Self-pay | Admitting: *Deleted

## 2022-07-23 DIAGNOSIS — D631 Anemia in chronic kidney disease: Secondary | ICD-10-CM | POA: Diagnosis not present

## 2022-07-23 DIAGNOSIS — E46 Unspecified protein-calorie malnutrition: Secondary | ICD-10-CM | POA: Diagnosis not present

## 2022-07-23 DIAGNOSIS — Z720 Tobacco use: Secondary | ICD-10-CM | POA: Diagnosis not present

## 2022-07-23 DIAGNOSIS — M069 Rheumatoid arthritis, unspecified: Secondary | ICD-10-CM | POA: Diagnosis not present

## 2022-07-23 DIAGNOSIS — N186 End stage renal disease: Secondary | ICD-10-CM | POA: Diagnosis not present

## 2022-07-23 DIAGNOSIS — L89312 Pressure ulcer of right buttock, stage 2: Secondary | ICD-10-CM | POA: Diagnosis not present

## 2022-07-23 DIAGNOSIS — Z992 Dependence on renal dialysis: Secondary | ICD-10-CM | POA: Diagnosis not present

## 2022-07-23 DIAGNOSIS — N051 Unspecified nephritic syndrome with focal and segmental glomerular lesions: Secondary | ICD-10-CM | POA: Diagnosis not present

## 2022-07-23 DIAGNOSIS — Z8711 Personal history of peptic ulcer disease: Secondary | ICD-10-CM | POA: Diagnosis not present

## 2022-07-23 DIAGNOSIS — J449 Chronic obstructive pulmonary disease, unspecified: Secondary | ICD-10-CM | POA: Diagnosis not present

## 2022-07-23 DIAGNOSIS — E785 Hyperlipidemia, unspecified: Secondary | ICD-10-CM | POA: Diagnosis not present

## 2022-07-23 DIAGNOSIS — Z7952 Long term (current) use of systemic steroids: Secondary | ICD-10-CM | POA: Diagnosis not present

## 2022-07-23 DIAGNOSIS — Z6820 Body mass index (BMI) 20.0-20.9, adult: Secondary | ICD-10-CM | POA: Diagnosis not present

## 2022-07-23 DIAGNOSIS — Z87440 Personal history of urinary (tract) infections: Secondary | ICD-10-CM | POA: Diagnosis not present

## 2022-07-23 DIAGNOSIS — I10 Essential (primary) hypertension: Secondary | ICD-10-CM | POA: Diagnosis not present

## 2022-07-23 NOTE — Patient Outreach (Signed)
  Care Coordination   07/23/2022 Name: [facility]ri [facility] MRN: [id number] DOB: 05/29/1950   Care Coordination Outreach Attempts:  An unsuccessful telephone outreach was attempted for a scheduled appointment today.  Follow Up Plan:  Additional outreach attempts will be made to offer the patient care coordination information and services.   Encounter Outcome:  No Answer. Left HIPAA compliant VM   Care Coordination Interventions:  No, not indicated    Chong Sicilian, BSN, RN-BC RN Care Coordinator [place]: [phone] Main #: [phone]

## 2022-07-24 DIAGNOSIS — N189 Chronic kidney disease, unspecified: Secondary | ICD-10-CM | POA: Diagnosis not present

## 2022-07-24 DIAGNOSIS — N179 Acute kidney failure, unspecified: Secondary | ICD-10-CM | POA: Diagnosis not present

## 2022-07-27 ENCOUNTER — Telehealth: Payer: Self-pay | Admitting: *Deleted

## 2022-07-27 DIAGNOSIS — Z992 Dependence on renal dialysis: Secondary | ICD-10-CM | POA: Diagnosis not present

## 2022-07-27 DIAGNOSIS — N189 Chronic kidney disease, unspecified: Secondary | ICD-10-CM | POA: Diagnosis not present

## 2022-07-27 DIAGNOSIS — N186 End stage renal disease: Secondary | ICD-10-CM | POA: Diagnosis not present

## 2022-07-27 DIAGNOSIS — Z8711 Personal history of peptic ulcer disease: Secondary | ICD-10-CM | POA: Diagnosis not present

## 2022-07-27 DIAGNOSIS — N179 Acute kidney failure, unspecified: Secondary | ICD-10-CM | POA: Diagnosis not present

## 2022-07-27 DIAGNOSIS — M069 Rheumatoid arthritis, unspecified: Secondary | ICD-10-CM | POA: Diagnosis not present

## 2022-07-27 DIAGNOSIS — N051 Unspecified nephritic syndrome with focal and segmental glomerular lesions: Secondary | ICD-10-CM | POA: Diagnosis not present

## 2022-07-27 DIAGNOSIS — Z7952 Long term (current) use of systemic steroids: Secondary | ICD-10-CM | POA: Diagnosis not present

## 2022-07-27 DIAGNOSIS — E785 Hyperlipidemia, unspecified: Secondary | ICD-10-CM | POA: Diagnosis not present

## 2022-07-27 DIAGNOSIS — E46 Unspecified protein-calorie malnutrition: Secondary | ICD-10-CM | POA: Diagnosis not present

## 2022-07-27 DIAGNOSIS — Z6820 Body mass index (BMI) 20.0-20.9, adult: Secondary | ICD-10-CM | POA: Diagnosis not present

## 2022-07-27 DIAGNOSIS — D631 Anemia in chronic kidney disease: Secondary | ICD-10-CM | POA: Diagnosis not present

## 2022-07-27 DIAGNOSIS — J449 Chronic obstructive pulmonary disease, unspecified: Secondary | ICD-10-CM | POA: Diagnosis not present

## 2022-07-27 DIAGNOSIS — I10 Essential (primary) hypertension: Secondary | ICD-10-CM | POA: Diagnosis not present

## 2022-07-27 DIAGNOSIS — L89312 Pressure ulcer of right buttock, stage 2: Secondary | ICD-10-CM | POA: Diagnosis not present

## 2022-07-27 DIAGNOSIS — Z87440 Personal history of urinary (tract) infections: Secondary | ICD-10-CM | POA: Diagnosis not present

## 2022-07-27 DIAGNOSIS — Z720 Tobacco use: Secondary | ICD-10-CM | POA: Diagnosis not present

## 2022-07-27 NOTE — Telephone Encounter (Signed)
   Telephone encounter was:  Successful.  07/27/2022 Name: RAKYAH PORTA MRN: [id number] DOB: Jan 02, 1951  Gardiner Ramus Ciampa is a 72 y.o. year old female who is a primary care patient of Caryl Bis, MD . The community resource team was consulted for assistance with Transportation Needs   Care guide performed the following interventions: Patient provided with information about care guide support team and interviewed to confirm resource needs Follow up call placed to community resources to determine status of patients referral. Scheduled rides for Dialysis for thursday and 08/03/2022  Follow Up Plan:  No further follow up planned at this time. The patient has been provided with needed resources.  [place] [phone] [address] , [place] [address] Email : [email or web address] [email or web address]

## 2022-07-28 DIAGNOSIS — J449 Chronic obstructive pulmonary disease, unspecified: Secondary | ICD-10-CM | POA: Diagnosis not present

## 2022-07-28 DIAGNOSIS — M069 Rheumatoid arthritis, unspecified: Secondary | ICD-10-CM | POA: Diagnosis not present

## 2022-07-28 DIAGNOSIS — Z992 Dependence on renal dialysis: Secondary | ICD-10-CM | POA: Diagnosis not present

## 2022-07-28 DIAGNOSIS — Z720 Tobacco use: Secondary | ICD-10-CM | POA: Diagnosis not present

## 2022-07-28 DIAGNOSIS — Z6820 Body mass index (BMI) 20.0-20.9, adult: Secondary | ICD-10-CM | POA: Diagnosis not present

## 2022-07-28 DIAGNOSIS — N051 Unspecified nephritic syndrome with focal and segmental glomerular lesions: Secondary | ICD-10-CM | POA: Diagnosis not present

## 2022-07-28 DIAGNOSIS — Z87440 Personal history of urinary (tract) infections: Secondary | ICD-10-CM | POA: Diagnosis not present

## 2022-07-28 DIAGNOSIS — N186 End stage renal disease: Secondary | ICD-10-CM | POA: Diagnosis not present

## 2022-07-28 DIAGNOSIS — Z7952 Long term (current) use of systemic steroids: Secondary | ICD-10-CM | POA: Diagnosis not present

## 2022-07-28 DIAGNOSIS — E46 Unspecified protein-calorie malnutrition: Secondary | ICD-10-CM | POA: Diagnosis not present

## 2022-07-28 DIAGNOSIS — D631 Anemia in chronic kidney disease: Secondary | ICD-10-CM | POA: Diagnosis not present

## 2022-07-28 DIAGNOSIS — E785 Hyperlipidemia, unspecified: Secondary | ICD-10-CM | POA: Diagnosis not present

## 2022-07-28 DIAGNOSIS — L89312 Pressure ulcer of right buttock, stage 2: Secondary | ICD-10-CM | POA: Diagnosis not present

## 2022-07-28 DIAGNOSIS — I10 Essential (primary) hypertension: Secondary | ICD-10-CM | POA: Diagnosis not present

## 2022-07-28 DIAGNOSIS — Z8711 Personal history of peptic ulcer disease: Secondary | ICD-10-CM | POA: Diagnosis not present

## 2022-07-29 ENCOUNTER — Ambulatory Visit: Payer: Self-pay | Admitting: *Deleted

## 2022-07-29 ENCOUNTER — Encounter: Payer: Self-pay | Admitting: *Deleted

## 2022-07-29 DIAGNOSIS — N179 Acute kidney failure, unspecified: Secondary | ICD-10-CM | POA: Diagnosis not present

## 2022-07-29 DIAGNOSIS — N189 Chronic kidney disease, unspecified: Secondary | ICD-10-CM | POA: Diagnosis not present

## 2022-07-29 NOTE — Patient Outreach (Signed)
Care Coordination   Follow Up Visit Note   07/29/2022 Name: [facility]ri [facility] MRN: [id number] DOB: [birth year]  Sheri Cooper is a 72 y.o. year old female who sees Caryl Bis, MD for primary care. I spoke with  [place]ri [place] by phone today.  What matters to the patients health and wellness today?  Securing transportation to dialysis    Goals Addressed             This Visit's Progress    COMPLETED: Care Coordination Services (r/t recent hosp SNF discharge & dialysis)       Care Coordination Goals: Patient will move carefully and change positions slowly to decrease risk for falls Patient will use assistive devices, walker & wheelchair, for ambulation Patient will utilize transportation assistance to dialysis that was arranged by [place] Patient will state understnding of need for wheelchair for transportation and need to have the ability to get out of her home and to the vehicle (patient confirmed understanding today) Patient will be delivered a wheelchair by [employer] but will continue using the one loaned by [facility] SNF until she has received that one Patient will work with [place] for nursing, PT, OT, and aide services Patient will work with [facility] Renal Coordinator in [place] as needed Patient will consider applying for Medicaid to assist with financial burden of dialysis, transportation, office visits, and meds Patient will talk with social worker at Dialysis center to see what assistance they can offer Patient will reach out to [place] at [phone] for any care coordination or resource needs     Secure Transportation to Dialysis       Care Coordination Goals: Patient will complete application for [place] for dialysis transportation [facility] in [place], Th, & Sat at 10:30 Patient will talk with Education officer, museum at [facility] while there today regarding what the can offer to assist her with  transportation Patient will state understanding that transportation has been arranged for Tues 08/03/22 through [facility] but that is the last visit that they will be able to arrange for her Patient will talk with family/friends to see if she can secure transportation for this Sat to dialysis  Patient will talk with RN Care Coordinator on 08/02/22 [phone]) Patient will talk with Nat Christen, LCSW [phone]) to see if there is anything she can assist with        SDOH assessments and interventions completed:  No    Care Coordination Interventions:  Yes, provided  Interventions Today    Flowsheet Row Most Recent Value  Chronic Disease   Chronic disease during today's visit Chronic Kidney Disease/End Stage Renal Disease (ESRD)  General Interventions   General Interventions Discussed/Reviewed Doctor Visits, Communication with, General Interventions Reviewed, General Interventions Discussed  Doctor Visits Discussed/Reviewed Specialist, Doctor Visits Discussed, Doctor Visits Reviewed  [facility] Dialysis T, Th, and Sat at 10:30]  PCP/Specialist Visits Compliance with follow-up visit  Communication with Social Work  Henderson Saporito, [employer] [facility] Transportation resources have been exhausted.Declined applying for Medicaid while at SNF. Reports no ins trans. benefits. Talked with [place] Renal Coordinator, Karie Schwalbe. See note in [facility].]  Applications Other  [Grant for dialsyis transport through [facility]. Patient received application yesterday.]  Education Interventions   Education Provided Provided Education  Provided Verbal Education On When to see the doctor, Other, [employer], Medical illustrator for dialsyis transport through [place]. Patient received application yesterday.]  Mental Health  Interventions   Mental Health Discussed/Reviewed Refer to Social Work for resources  Refer to Social Work for resources regarding Other  [applying for [employer] and whether  there are any other transportation options available]       Follow up plan: Follow up call scheduled for 08/02/22    Encounter Outcome:  Pt. Visit Completed   Chong Sicilian, BSN, RN-BC RN Care Coordinator [place]: [phone] Main #: [phone]

## 2022-07-30 DIAGNOSIS — E785 Hyperlipidemia, unspecified: Secondary | ICD-10-CM | POA: Diagnosis not present

## 2022-07-30 DIAGNOSIS — N051 Unspecified nephritic syndrome with focal and segmental glomerular lesions: Secondary | ICD-10-CM | POA: Diagnosis not present

## 2022-07-30 DIAGNOSIS — Z992 Dependence on renal dialysis: Secondary | ICD-10-CM | POA: Diagnosis not present

## 2022-07-30 DIAGNOSIS — E46 Unspecified protein-calorie malnutrition: Secondary | ICD-10-CM | POA: Diagnosis not present

## 2022-07-30 DIAGNOSIS — Z87440 Personal history of urinary (tract) infections: Secondary | ICD-10-CM | POA: Diagnosis not present

## 2022-07-30 DIAGNOSIS — N186 End stage renal disease: Secondary | ICD-10-CM | POA: Diagnosis not present

## 2022-07-30 DIAGNOSIS — Z6820 Body mass index (BMI) 20.0-20.9, adult: Secondary | ICD-10-CM | POA: Diagnosis not present

## 2022-07-30 DIAGNOSIS — I10 Essential (primary) hypertension: Secondary | ICD-10-CM | POA: Diagnosis not present

## 2022-07-30 DIAGNOSIS — J449 Chronic obstructive pulmonary disease, unspecified: Secondary | ICD-10-CM | POA: Diagnosis not present

## 2022-07-30 DIAGNOSIS — D631 Anemia in chronic kidney disease: Secondary | ICD-10-CM | POA: Diagnosis not present

## 2022-07-30 DIAGNOSIS — Z720 Tobacco use: Secondary | ICD-10-CM | POA: Diagnosis not present

## 2022-07-30 DIAGNOSIS — L89312 Pressure ulcer of right buttock, stage 2: Secondary | ICD-10-CM | POA: Diagnosis not present

## 2022-07-30 DIAGNOSIS — M069 Rheumatoid arthritis, unspecified: Secondary | ICD-10-CM | POA: Diagnosis not present

## 2022-07-30 DIAGNOSIS — Z7952 Long term (current) use of systemic steroids: Secondary | ICD-10-CM | POA: Diagnosis not present

## 2022-07-30 DIAGNOSIS — Z8711 Personal history of peptic ulcer disease: Secondary | ICD-10-CM | POA: Diagnosis not present

## 2022-07-31 DIAGNOSIS — N189 Chronic kidney disease, unspecified: Secondary | ICD-10-CM | POA: Diagnosis not present

## 2022-07-31 DIAGNOSIS — N179 Acute kidney failure, unspecified: Secondary | ICD-10-CM | POA: Diagnosis not present

## 2022-08-02 ENCOUNTER — Ambulatory Visit: Payer: Self-pay | Admitting: *Deleted

## 2022-08-02 ENCOUNTER — Encounter: Payer: Self-pay | Admitting: *Deleted

## 2022-08-02 NOTE — Patient Instructions (Signed)
Visit Information  Thank you for taking time to visit with me today. Please don't hesitate to contact me if I can be of assistance to you.   Following are the goals we discussed today:   Goals Addressed               This Visit's Progress     Assist with Theme park manager and Resources. (pt-stated)   On track     Care Coordination Interventions:  Interventions Today    Flowsheet Row Most Recent Value  Chronic Disease   Chronic disease during today's visit Chronic Obstructive Pulmonary Disease (COPD), Chronic Kidney Disease/End Stage Renal Disease (ESRD), Other  [Transportation Deficits]  General Interventions   General Interventions Discussed/Reviewed General Interventions Discussed, General Interventions Reviewed, Annual Eye Exam, Durable Medical Equipment (DME), Community Resources, Level of Care, Communication with, Doctor Visits, Vaccines, Health Screening  [Communication with Primary Care Provider]  Vaccines COVID-19, Flu, Pneumonia, RSV, Shingles, Tetanus/Pertussis/Diphtheria  [Encouraged]  Doctor Visits Discussed/Reviewed Doctor Visits Discussed, Doctor Visits Reviewed, Annual Wellness Visits, PCP, Specialist  [Encouraged]  Health Screening Bone Density, Colonoscopy, Mammogram  [Encouraged]  Durable Medical Equipment (DME) Gilford Rile, Community education officer  PCP/Specialist Visits Compliance with follow-up visit  [Encouraged]  Communication with PCP/Specialists, RN  Level of Care Adult Daycare, Location manager, Assisted Living, Personal Care Psychologist, forensic Medicaid, Personal Care Services, FL-2, Other  [[facility] Application]  Exercise Interventions   Exercise Discussed/Reviewed Exercise Discussed, Assistive device use and maintanence, Exercise Reviewed, Physical Activity, Weight Managment  [Encouraged]  Physical Activity Discussed/Reviewed Physical Activity Discussed, Home Exercise Program (HEP), Physical Activity Reviewed, Types of exercise   [Encouraged]  Weight Management Weight maintenance  [Encouraged]  Education Interventions   Education Provided Provided Engineer, site, Provided Web-based Education, Provided Education  Provided Verbal Education On Nutrition, Mental Health/Coping with Illness, Applications, Exercise, Medication, Personal assistant, [employer], When to see the Financial planner Medicaid, Personal Care Services, FL-2, Other  [[facility] Application]  Mental Health Interventions   Mental Health Discussed/Reviewed Mental Health Discussed, Anxiety, Depression, Grief and Loss, Substance Abuse, Suicide, Crisis, Coping Strategies, Mental Health Reviewed  Nutrition Interventions   Nutrition Discussed/Reviewed Nutrition Discussed, Nutrition Reviewed, Adding fruits and vegetables, Fluid intake, Portion sizes, Decreasing sugar intake, Supplmental nutrition, Decreasing salt, Decreasing fats, Increaing proteins  [Encouraged]  Pharmacy Interventions   Pharmacy Dicussed/Reviewed Pharmacy Topics Discussed, Medication Adherence, Pharmacy Topics Reviewed, Affording Medications  [Encouraged]  Safety Interventions   Safety Discussed/Reviewed Safety Discussed, Safety Reviewed, Fall Risk, Home Safety  [Encouraged]  Home Safety Assistive Devices, Need for home safety assessment, Refer for community resources  Advanced Directive Interventions   Advanced Directives Discussed/Reviewed Advanced Directives Discussed, Advanced Directives Reviewed  [Completion Encouraged]     Assessed Social Determinant of Health Barriers. Discussed Plans for Ongoing Care Management Follow Up. Provided Tree surgeon Information for Care Management Team Members. Screened for Signs & Symptoms of Depression, Related to Chronic Disease State.  PHQ2 & PHQ9 Depression Screen Completed & Results Reviewed.  Suicidal Ideation & Homicidal Ideation Assessed - None Present.   Domestic Violence Assessed - None Present. Access to Weapons Assessed - None  Present.   Active Listening & Reflection Utilized.  Verbalization of Feelings Encouraged.  Emotional Support Provided. Feelings of Frustration Validated, with Regards to Lack of Transportation. Caregiver Stress Acknowledged. Caregiver Resources Reviewed. Caregiver Support Groups Provided. Self-Enrollment in Caregiver Support Group of Interest Emphasized. Crisis Support Information, Agencies, Services & Resources Discussed. Problem Solving Interventions Identified. Task-Centered Solutions Implemented.   Solution-Focused Strategies  Developed. Acceptance & Commitment Therapy Introduced. Brief Cognitive Behavioral Therapy Initiated. Client-Centered Therapy Enacted. Reviewed Prescription Medications & Discussed Importance of Compliance. Quality of Sleep Assessed & Sleep Hygiene Techniques Promoted. CSW Environmental education officer from [employer] [phone]), to Verify No Naval architect Under Licensed conveyancer. Please Complete Application for ([facility]) [place] [phone]) & Submit to ADTS (Aging, [place] [phone]), for Processing. Please Complete Application for Medicaid & Submit to [facility] [phone]), for Processing. Please Contact CSW Directly (# [phone]), if You Have Questions, Need Assistance, or If Additional Social Work Needs Are Identified Between Now & Our Next Scheduled Telephone [employer].      Our next appointment is by telephone on 08/16/2022 at 1:45 pm.  Please call the care guide team at [phone] if you need to cancel or reschedule your appointment.   If you are experiencing a Mental Health or [place] or need someone to talk to, please call the Suicide and Crisis Lifeline: 988 call the [place] National Suicide Prevention Lifeline: 331 732 6795 or TTY:  (906) 730-3319 TTY 325-666-0995) to talk to a trained counselor call 1-800-273-TALK (toll free, 24 hour hotline) go to [facility] [address] [place] [id number]) call the Pink: 765-291-4541 call 911  Patient verbalizes understanding of instructions and care plan provided today and agrees to view in [place]. Active MyChart status and patient understanding of how to access instructions and care plan via MyChart confirmed with patient.     Telephone follow up appointment with care management team member scheduled for:  08/16/2022 at 1:45 pm.  Nat Christen, BSW, MSW, [place]  Licensed Clinical Social Worker  [place]  Mailing [place]. [address] [place], [place] [address] Physical Address-[address], [place],  [address] Toll Free Main # [phone] Fax # [phone] Cell # [phone] [email or web address]

## 2022-08-02 NOTE — Patient Outreach (Signed)
Care Coordination   Initial Visit Note   08/02/2022  Name: Sheri Cooper MRN: [id number] DOB: Jul 02, 1950  Sheri Cooper is a 72 y.o. year old female who sees Caryl Bis, MD for primary care. I spoke with Sheri Cooper by phone today.  What matters to the patients health and wellness today?  Assist with Theme park manager and Resources.   Goals Addressed               This Visit's Progress     Assist with Theme park manager and Resources. (pt-stated)   On track     Care Coordination Interventions:  Interventions Today    Flowsheet Row Most Recent Value  Chronic Disease   Chronic disease during today's visit Chronic Obstructive Pulmonary Disease (COPD), Chronic Kidney Disease/End Stage Renal Disease (ESRD), Other  [Transportation Deficits]  General Interventions   General Interventions Discussed/Reviewed General Interventions Discussed, General Interventions Reviewed, Annual Eye Exam, Durable Medical Equipment (DME), Community Resources, Level of Care, Communication with, Doctor Visits, Vaccines, Health Screening  [Communication with Primary Care Provider]  Vaccines COVID-19, Flu, Pneumonia, RSV, Shingles, Tetanus/Pertussis/Diphtheria  [Encouraged]  Doctor Visits Discussed/Reviewed Doctor Visits Discussed, Doctor Visits Reviewed, Annual Wellness Visits, PCP, Specialist  [Encouraged]  Health Screening Bone Density, Colonoscopy, Mammogram  [Encouraged]  Durable Medical Equipment (DME) Gilford Rile, Community education officer  PCP/Specialist Visits Compliance with follow-up visit  [Encouraged]  Communication with PCP/Specialists, RN  Level of Care Adult Daycare, Location manager, Assisted Living, Personal Care Psychologist, forensic Medicaid, Personal Care Services, FL-2, Other  [RCATS Application]  Exercise Interventions   Exercise Discussed/Reviewed Exercise Discussed, Assistive device use and maintanence, Exercise Reviewed, Physical  Activity, Weight Managment  [Encouraged]  Physical Activity Discussed/Reviewed Physical Activity Discussed, Home Exercise Program (HEP), Physical Activity Reviewed, Types of exercise  [Encouraged]  Weight Management Weight maintenance  [Encouraged]  Education Interventions   Education Provided Provided Engineer, site, Provided Web-based Education, Provided Education  Provided Verbal Education On Nutrition, Mental Health/Coping with Illness, Applications, Exercise, Medication, Personal assistant, [employer], When to see the Financial planner Medicaid, Personal Care Services, FL-2, Other  [RCATS Application]  Mental Health Interventions   Mental Health Discussed/Reviewed Mental Health Discussed, Anxiety, Depression, Grief and Loss, Substance Abuse, Suicide, Crisis, Coping Strategies, Mental Health Reviewed  Nutrition Interventions   Nutrition Discussed/Reviewed Nutrition Discussed, Nutrition Reviewed, Adding fruits and vegetables, Fluid intake, Portion sizes, Decreasing sugar intake, Supplmental nutrition, Decreasing salt, Decreasing fats, Increaing proteins  [Encouraged]  Pharmacy Interventions   Pharmacy Dicussed/Reviewed Pharmacy Topics Discussed, Medication Adherence, Pharmacy Topics Reviewed, Affording Medications  [Encouraged]  Safety Interventions   Safety Discussed/Reviewed Safety Discussed, Safety Reviewed, Fall Risk, Home Safety  [Encouraged]  Home Safety Assistive Devices, Need for home safety assessment, Refer for community resources  Advanced Directive Interventions   Advanced Directives Discussed/Reviewed Advanced Directives Discussed, Advanced Directives Reviewed  [Completion Encouraged]     Assessed Social Determinant of Health Barriers. Discussed Plans for Ongoing Care Management Follow Up. Provided Tree surgeon Information for Care Management Team Members. Screened for Signs & Symptoms of Depression, Related to Chronic Disease State.  PHQ2 & PHQ9 Depression  Screen Completed & Results Reviewed.  Suicidal Ideation & Homicidal Ideation Assessed - None Present.   Domestic Violence Assessed - None Present. Access to Weapons Assessed - None Present.   Active Listening & Reflection Utilized.  Verbalization of Feelings Encouraged.  Emotional Support Provided. Feelings of Frustration Validated, with Regards to Lack of Transportation. Caregiver Stress Acknowledged. Caregiver Resources Reviewed. Caregiver  Support Groups Provided. Self-Enrollment in Caregiver Support Group of Interest Emphasized. Crisis Support Information, Agencies, Services & Resources Discussed. Problem Solving Interventions Identified. Task-Centered Solutions Implemented.   Solution-Focused Strategies Developed. Acceptance & Commitment Therapy Introduced. Brief Cognitive Behavioral Therapy Initiated. Client-Centered Therapy Enacted. Reviewed Prescription Medications & Discussed Importance of Compliance. Quality of Sleep Assessed & Sleep Hygiene Techniques Promoted. CSW Environmental education officer from [employer] [phone]), to Verify No Naval architect Under Licensed conveyancer. Please Complete Application for (RCATS) [place] [phone]) & Submit to ADTS (Aging, [place] [phone]), for Processing. Please Complete Application for Medicaid & Submit to [employer] [phone]), for Processing. Please Contact CSW Directly (# [phone]), if You Have Questions, Need Assistance, or If Additional Social Work Needs Are Identified Between Now & Our Next Scheduled Telephone [employer].        SDOH assessments and interventions completed:  Yes.  SDOH Interventions Today    Flowsheet Row Most Recent Value  SDOH Interventions   Food Insecurity Interventions Intervention Not Indicated   Housing Interventions Intervention Not Indicated  Transportation Interventions Patient Resources (Friends/Family)  [Provided Transportation Resources & Applications]  Utilities Interventions Intervention Not Indicated  Alcohol Usage Interventions Intervention Not Indicated (Score <7)  Financial Strain Interventions Intervention Not Indicated  Physical Activity Interventions Patient Refused  Stress Interventions Intervention Not Indicated, Offered [employer] Resources  Social Connections Interventions Intervention Not Indicated     Care Coordination Interventions:  Yes, provided.   Follow up plan: Follow up call scheduled for 08/16/2022 at 1:45 pm.   Encounter Outcome:  Pt. Visit Completed.   Nat Christen, BSW, MSW, LCSW  Licensed Education officer, environmental Health System  Mailing [place] [address] [place], [place] [address] Physical Address-[address], [place], [place] [address] Toll Free Main # [phone] Fax # [phone] Cell # [phone] [email or web address]

## 2022-08-03 DIAGNOSIS — Z992 Dependence on renal dialysis: Secondary | ICD-10-CM | POA: Diagnosis not present

## 2022-08-03 DIAGNOSIS — N186 End stage renal disease: Secondary | ICD-10-CM | POA: Diagnosis not present

## 2022-08-04 DIAGNOSIS — M0579 Rheumatoid arthritis with rheumatoid factor of multiple sites without organ or systems involvement: Secondary | ICD-10-CM | POA: Diagnosis not present

## 2022-08-04 DIAGNOSIS — Z720 Tobacco use: Secondary | ICD-10-CM | POA: Diagnosis not present

## 2022-08-04 DIAGNOSIS — L89312 Pressure ulcer of right buttock, stage 2: Secondary | ICD-10-CM | POA: Diagnosis not present

## 2022-08-04 DIAGNOSIS — N186 End stage renal disease: Secondary | ICD-10-CM | POA: Diagnosis not present

## 2022-08-04 DIAGNOSIS — M069 Rheumatoid arthritis, unspecified: Secondary | ICD-10-CM | POA: Diagnosis not present

## 2022-08-04 DIAGNOSIS — E785 Hyperlipidemia, unspecified: Secondary | ICD-10-CM | POA: Diagnosis not present

## 2022-08-04 DIAGNOSIS — Z87440 Personal history of urinary (tract) infections: Secondary | ICD-10-CM | POA: Diagnosis not present

## 2022-08-04 DIAGNOSIS — Z7952 Long term (current) use of systemic steroids: Secondary | ICD-10-CM | POA: Diagnosis not present

## 2022-08-04 DIAGNOSIS — E46 Unspecified protein-calorie malnutrition: Secondary | ICD-10-CM | POA: Diagnosis not present

## 2022-08-04 DIAGNOSIS — Z6820 Body mass index (BMI) 20.0-20.9, adult: Secondary | ICD-10-CM | POA: Diagnosis not present

## 2022-08-04 DIAGNOSIS — I10 Essential (primary) hypertension: Secondary | ICD-10-CM | POA: Diagnosis not present

## 2022-08-04 DIAGNOSIS — Z8711 Personal history of peptic ulcer disease: Secondary | ICD-10-CM | POA: Diagnosis not present

## 2022-08-04 DIAGNOSIS — J449 Chronic obstructive pulmonary disease, unspecified: Secondary | ICD-10-CM | POA: Diagnosis not present

## 2022-08-04 DIAGNOSIS — D631 Anemia in chronic kidney disease: Secondary | ICD-10-CM | POA: Diagnosis not present

## 2022-08-04 DIAGNOSIS — Z992 Dependence on renal dialysis: Secondary | ICD-10-CM | POA: Diagnosis not present

## 2022-08-04 DIAGNOSIS — N051 Unspecified nephritic syndrome with focal and segmental glomerular lesions: Secondary | ICD-10-CM | POA: Diagnosis not present

## 2022-08-05 ENCOUNTER — Telehealth: Payer: Self-pay | Admitting: *Deleted

## 2022-08-05 DIAGNOSIS — N186 End stage renal disease: Secondary | ICD-10-CM | POA: Diagnosis not present

## 2022-08-05 DIAGNOSIS — Z992 Dependence on renal dialysis: Secondary | ICD-10-CM | POA: Diagnosis not present

## 2022-08-05 NOTE — Telephone Encounter (Signed)
   Telephone encounter was:  Successful.  08/05/2022 Name: Sheri Cooper MRN: [id number] DOB: 06-10-1950  [name] is a 72 y.o. year old female who is a primary care patient of Quillian Quince Mitzie Na, MD . The community resource team was consulted for assistance with Transportation Needs   Care guide performed the following interventions: Follow up call placed to the patient to discuss status of referral. This patient was outreached again for this request the patient has been provided all information again at LCSW request Patient has been provided all the information I have there is a grant for non Medicaid  through the center we have tried a few complimentary rides through thn but patient rides are not picked up as there are limited wheelchair lift vans in the county and this is means the rides we  have put in  roughly 4 have not been picked up by transportation , patient has already been informed that there is no availability to book through thn ongoing , basically as far as we can at this point determine there is not service available to her at this time she has already been referred to our renal team and the Transportation lead is aware of the ongoing issue but we  have no further resources to offer her   Follow Up Plan:  No further follow up planned at this time. The patient has been provided with needed resources. [place] [phone] [address] , [place] [address] Email : [email or web address]

## 2022-08-06 ENCOUNTER — Ambulatory Visit: Payer: Self-pay | Admitting: *Deleted

## 2022-08-06 ENCOUNTER — Encounter: Payer: Self-pay | Admitting: *Deleted

## 2022-08-06 DIAGNOSIS — L89312 Pressure ulcer of right buttock, stage 2: Secondary | ICD-10-CM | POA: Diagnosis not present

## 2022-08-06 DIAGNOSIS — Z8711 Personal history of peptic ulcer disease: Secondary | ICD-10-CM | POA: Diagnosis not present

## 2022-08-06 DIAGNOSIS — E785 Hyperlipidemia, unspecified: Secondary | ICD-10-CM | POA: Diagnosis not present

## 2022-08-06 DIAGNOSIS — N186 End stage renal disease: Secondary | ICD-10-CM | POA: Diagnosis not present

## 2022-08-06 DIAGNOSIS — E46 Unspecified protein-calorie malnutrition: Secondary | ICD-10-CM | POA: Diagnosis not present

## 2022-08-06 DIAGNOSIS — M069 Rheumatoid arthritis, unspecified: Secondary | ICD-10-CM | POA: Diagnosis not present

## 2022-08-06 DIAGNOSIS — Z720 Tobacco use: Secondary | ICD-10-CM | POA: Diagnosis not present

## 2022-08-06 DIAGNOSIS — Z992 Dependence on renal dialysis: Secondary | ICD-10-CM | POA: Diagnosis not present

## 2022-08-06 DIAGNOSIS — Z7952 Long term (current) use of systemic steroids: Secondary | ICD-10-CM | POA: Diagnosis not present

## 2022-08-06 DIAGNOSIS — Z6820 Body mass index (BMI) 20.0-20.9, adult: Secondary | ICD-10-CM | POA: Diagnosis not present

## 2022-08-06 DIAGNOSIS — J449 Chronic obstructive pulmonary disease, unspecified: Secondary | ICD-10-CM | POA: Diagnosis not present

## 2022-08-06 DIAGNOSIS — Z87440 Personal history of urinary (tract) infections: Secondary | ICD-10-CM | POA: Diagnosis not present

## 2022-08-06 DIAGNOSIS — N051 Unspecified nephritic syndrome with focal and segmental glomerular lesions: Secondary | ICD-10-CM | POA: Diagnosis not present

## 2022-08-06 DIAGNOSIS — I10 Essential (primary) hypertension: Secondary | ICD-10-CM | POA: Diagnosis not present

## 2022-08-06 DIAGNOSIS — D631 Anemia in chronic kidney disease: Secondary | ICD-10-CM | POA: Diagnosis not present

## 2022-08-06 NOTE — Patient Outreach (Signed)
  Care Coordination   Follow Up Visit Note   08/06/2022 Name: Sheri Cooper MRN: [id number] DOB: 06-22-1950  Sheri Ochs Crume is a 72 y.o. year old female who sees Richardean Chimera, MD for primary care. I spoke with  Sheri Cooper by phone today.  What matters to the patients health and wellness today?  Securing transportation for dialysis    Goals Addressed             This Visit's Progress    Secure Transportation to Dialysis       Care Coordination Goals: Patient will complete application for RCATS Kennedy Bucker for dialysis transportation [facility] in [place], Th, & Sat at 10:30 Patient will talk with Social Worker at [facility] while there today regarding what the can offer to assist her with transportation [facility] awaiting return call from SW at [place] on Monday 4/8 regarding transition form CKD to ESRD and additional resources available Awaiting return call regarding transportation arrangement through [facility]. Environmental health practitioner relayed that transportation arrangement had been documented. Unsure if it is self pay. If so, that is still a barrier.  Patient will talk with family/friends to see if she can secure transportation for this Sat to dialysis  Patient will reach out to RN Care Coordinator [phone] with any resource or care coordination needs Patient will talk with Danford Bad, LCSW [phone]) to see if there is anything she can assist with        SDOH assessments and interventions completed:  Yes SDOH Interventions Today    Flowsheet Row Most Recent Value  SDOH Interventions   Transportation Interventions Other (Comment), Patient Resources (Friends/Family)  [can coordinate some Sat dialsyis visits with family/friends. Working on arranging transportation for [employer] and Thurs]        Care Coordination Interventions:  Yes, provided  Interventions Today    Flowsheet Row Most Recent Value  Chronic Disease   Chronic disease during  today's visit Chronic Kidney Disease/End Stage Renal Disease (ESRD)  General Interventions   General Interventions Discussed/Reviewed General Interventions Discussed, General Interventions Reviewed, Communication with, Doctor Visits, [facility]  Doctor Visits Discussed/Reviewed Specialist  Alver Sorrow T, Th, Sat at 10:30 at [facility] in [place]]  Durable Medical Equipment (DME) Dan Humphreys, Psychologist, forensic  PCP/Specialist Visits Compliance with follow-up visit  Communication with PCP/Specialists, Social Work  Corporate investment banker at [employer], per their notes, pt is connected for transportation with [facility]. Unsure if it is self-pay. SW to call me back on Mon, 4/8. I asked if they could assist with RCATS grant application while there at dialysis.]  Advanced Directive Interventions   Advanced Directives Discussed/Reviewed --  [Per patient and LCSW, patient is connected with [facility]. Per patient, it is for palliative care and not hospice. They are coming once per month right now.]       Follow up plan: Follow up call scheduled for 08/09/22    Encounter Outcome:  Pt. Visit Completed   Demetrios Loll, BSN, RN-BC RN Care Coordinator [facility]  [facility] Direct Dial: [phone] Main #: [phone]

## 2022-08-06 NOTE — Patient Instructions (Signed)
Visit Information  Thank you for taking time to visit with me today. Please don't hesitate to contact me if I can be of assistance to you.   Following are the goals we discussed today:   Goals Addressed               This Visit's Progress     Assist with Tax inspectorroviding Transportation Services and Resources. (pt-stated)   On track     Care Coordination Interventions:  Interventions Today    Flowsheet Row Most Recent Value  Chronic Disease   Chronic disease during today's visit Chronic Obstructive Pulmonary Disease (COPD), Chronic Kidney Disease/End Stage Renal Disease (ESRD), Other  [Transportation Deficits]  General Interventions   General Interventions Discussed/Reviewed General Interventions Discussed, General Interventions Reviewed, Annual Eye Exam, Durable Medical Equipment (DME), Community Resources, Level of Care, Communication with, Doctor Visits, Vaccines, Health Screening  [Communication with Primary Care Provider]  Vaccines COVID-19, Flu, Pneumonia, RSV, Shingles, Tetanus/Pertussis/Diphtheria  [Encouraged]  Doctor Visits Discussed/Reviewed Doctor Visits Discussed, Doctor Visits Reviewed, Annual Wellness Visits, PCP, Specialist  [Encouraged]  Health Screening Bone Density, Colonoscopy, Mammogram  [Encouraged]  Durable Medical Equipment (DME) Dan HumphreysWalker, Psychologist, forensicWheelchair  Wheelchair Standard  PCP/Specialist Visits Compliance with follow-up visit  [Encouraged]  Communication with PCP/Specialists, RN  Level of Care Adult Daycare, Air traffic controllerApplications, Assisted Living, Personal Care Human resources officerervices  Applications Medicaid, Personal Care Services, FL-2, Other  [RCATS Application]  Exercise Interventions   Exercise Discussed/Reviewed Exercise Discussed, Assistive device use and maintanence, Exercise Reviewed, Physical Activity, Weight Managment  [Encouraged]  Physical Activity Discussed/Reviewed Physical Activity Discussed, Home Exercise Program (HEP), Physical Activity Reviewed, Types of exercise   [Encouraged]  Weight Management Weight maintenance  [Encouraged]  Education Interventions   Education Provided Provided Therapist, sportsrinted Education, Provided Web-based Education, Provided Education  Provided Verbal Education On Nutrition, Mental Health/Coping with Illness, Applications, Exercise, Medication, Development worker, communitynsurance Plans, [facility]greenCommunity Resources, When to see the Scientist, research (physical sciences)doctor  Applications Medicaid, Personal Care Services, FL-2, Other  [RCATS Application]  Mental Health Interventions   Mental Health Discussed/Reviewed Mental Health Discussed, Anxiety, Depression, Grief and Loss, Substance Abuse, Suicide, Crisis, Coping Strategies, Mental Health Reviewed  Nutrition Interventions   Nutrition Discussed/Reviewed Nutrition Discussed, Nutrition Reviewed, Adding fruits and vegetables, Fluid intake, Portion sizes, Decreasing sugar intake, Supplmental nutrition, Decreasing salt, Decreasing fats, Increaing proteins  [Encouraged]  Pharmacy Interventions   Pharmacy Dicussed/Reviewed Pharmacy Topics Discussed, Medication Adherence, Pharmacy Topics Reviewed, Affording Medications  [Encouraged]  Safety Interventions   Safety Discussed/Reviewed Safety Discussed, Safety Reviewed, Fall Risk, Home Safety  [Encouraged]  Home Safety Assistive Devices, Need for home safety assessment, Refer for community resources  Advanced Directive Interventions   Advanced Directives Discussed/Reviewed Advanced Directives Discussed, Advanced Directives Reviewed  [Completion Encouraged]     Active Listening & Reflection Utilized.  Verbalization of Feelings Encouraged.  Emotional Support Provided. Feelings of Frustration Validated, with Regards to Lack of Transportation. Caregiver Stress Acknowledged. Caregiver Resources Reviewed. Caregiver Support Groups Provided. Self-Enrollment in Caregiver Support Group of Interest Emphasized. Crisis Support Information, Agencies, Services & Resources Discussed. Problem Solving Interventions  Identified. Task-Centered Solutions Implemented.   Solution-Focused Strategies Developed. Acceptance & Commitment Therapy Introduced. Brief Cognitive Behavioral Therapy Initiated. Client-Centered Therapy Enacted. CSW Energy managerCollaboration with Representative from [employer] Transportation Services [phone](# [phone]), to Verify No Aeronautical engineerTransportation Benefits Under Retail buyerCurrent Policy. CSW Collaboration with Representative from Renal Team to Address Concerns Related to Transportation Barriers. CSW Collaboration with Transportation Lead to Address Concerns Related to Transportation Barriers. Please Complete Application for (RCATS) [facility] [employer]ccess Transportation Services (#  [phone]) & Submit to ADTS (Aging, Disability & Transit Services [phone]), for Processing. Please Complete Application for Medicaid & Submit to [facility] of Social Services (# [phone]), for Processing. CSW Collaboration with Coralee Rud, Nurse with [facility] [phone]), to Confirm Approval for Palliative Care Services. Please Contact CSW Directly (# [phone]), if You Have Questions, Need Assistance, or If Additional Social Work Needs Are Identified Between Now & Our Next Scheduled Telephone [employer].      Our next appointment is by telephone on 08/16/2022 at 1:45 pm.  Please call the care guide team at [phone] if you need to cancel or reschedule your appointment.   If you are experiencing a Mental Health or Behavioral Health Crisis or need someone to talk to, please call the Suicide and Crisis Lifeline: 988 call the [place] National Suicide Prevention Lifeline: 412-260-5135 or TTY: (236)498-1660 TTY (229) 159-3569) to talk to a trained counselor call 1-800-273-TALK (toll free, 24 hour hotline) go to [facility] [address], [place]  [phone]) call the [facility] Crisis Line: [phone] call 911  Patient verbalizes understanding of instructions and care plan provided today and agrees to view in MyChart. Active MyChart status and patient understanding of how to access instructions and care plan via MyChart confirmed with patient.     Telephone follow up appointment with care management team member scheduled for:  08/16/2022 at 1:45 pm.  Danford Bad, BSW, MSW, LCSW  Licensed Clinical Social Worker  [facility]  Mailing [place] [place], [place] [address] Physical Address-[address] [place], [place] [address] Toll Free Main # [phone] Fax # [phone] Cell # [phone] [email or web address]

## 2022-08-06 NOTE — Patient Outreach (Signed)
Care Coordination   Follow Up Visit Note   08/06/2022  Name: EDIE COZZA MRN: [id number] DOB: 03/04/1951  Geralynn Ochs Dubose is a 72 y.o. year old female who sees Richardean Chimera, MD for primary care. I spoke with Geralynn Ochs Schlichting by phone today.  What matters to the patients health and wellness today?  Assist with Tax inspector and Resources.   Goals Addressed               This Visit's Progress     Assist with Tax inspector and Resources. (pt-stated)   On track     Care Coordination Interventions:  Interventions Today    Flowsheet Row Most Recent Value  Chronic Disease   Chronic disease during today's visit Chronic Obstructive Pulmonary Disease (COPD), Chronic Kidney Disease/End Stage Renal Disease (ESRD), Other  [Transportation Deficits]  General Interventions   General Interventions Discussed/Reviewed General Interventions Discussed, General Interventions Reviewed, Annual Eye Exam, Durable Medical Equipment (DME), Community Resources, Level of Care, Communication with, Doctor Visits, Vaccines, Health Screening  [Communication with Primary Care Provider]  Vaccines COVID-19, Flu, Pneumonia, RSV, Shingles, Tetanus/Pertussis/Diphtheria  [Encouraged]  Doctor Visits Discussed/Reviewed Doctor Visits Discussed, Doctor Visits Reviewed, Annual Wellness Visits, PCP, Specialist  [Encouraged]  Health Screening Bone Density, Colonoscopy, Mammogram  [Encouraged]  Durable Medical Equipment (DME) Dan Humphreys, Psychologist, forensic  PCP/Specialist Visits Compliance with follow-up visit  [Encouraged]  Communication with PCP/Specialists, RN  Level of Care Adult Daycare, Air traffic controller, Assisted Living, Personal Care Human resources officer Medicaid, Personal Care Services, [place]-2, Other  [RCATS Application]  Exercise Interventions   Exercise Discussed/Reviewed Exercise Discussed, Assistive device use and maintanence, Exercise Reviewed,  Physical Activity, Weight Managment  [Encouraged]  Physical Activity Discussed/Reviewed Physical Activity Discussed, Home Exercise Program (HEP), Physical Activity Reviewed, Types of exercise  [Encouraged]  Weight Management Weight maintenance  [Encouraged]  Education Interventions   Education Provided Provided Therapist, sports, Provided Web-based Education, Provided Education  Provided Verbal Education On Nutrition, Mental Health/Coping with Illness, Applications, Exercise, Medication, Development worker, community, [employer], When to see the Scientist, research (physical sciences) Medicaid, Personal Care Services, FL-2, Other  [RCATS Application]  Mental Health Interventions   Mental Health Discussed/Reviewed Mental Health Discussed, Anxiety, Depression, Grief and Loss, Substance Abuse, Suicide, Crisis, Coping Strategies, Mental Health Reviewed  Nutrition Interventions   Nutrition Discussed/Reviewed Nutrition Discussed, Nutrition Reviewed, Adding fruits and vegetables, Fluid intake, Portion sizes, Decreasing sugar intake, Supplmental nutrition, Decreasing salt, Decreasing fats, Increaing proteins  [Encouraged]  Pharmacy Interventions   Pharmacy Dicussed/Reviewed Pharmacy Topics Discussed, Medication Adherence, Pharmacy Topics Reviewed, Affording Medications  [Encouraged]  Safety Interventions   Safety Discussed/Reviewed Safety Discussed, Safety Reviewed, Fall Risk, Home Safety  [Encouraged]  Home Safety Assistive Devices, Need for home safety assessment, Refer for community resources  Advanced Directive Interventions   Advanced Directives Discussed/Reviewed Advanced Directives Discussed, Advanced Directives Reviewed  [Completion Encouraged]     Active Listening & Reflection Utilized.  Verbalization of Feelings Encouraged.  Emotional Support Provided. Feelings of Frustration Validated, with Regards to Lack of Transportation. Caregiver Stress Acknowledged. Caregiver Resources Reviewed. Caregiver Support Groups  Provided. Self-Enrollment in Caregiver Support Group of Interest Emphasized. Crisis Support Information, Agencies, Services & Resources Discussed. Problem Solving Interventions Identified. Task-Centered Solutions Implemented.   Solution-Focused Strategies Developed. Acceptance & Commitment Therapy Introduced. Brief Cognitive Behavioral Therapy Initiated. Client-Centered Therapy Enacted. CSW Energy manager from [employer] [phone]), to Verify No Aeronautical engineer Under Retail buyer. CSW Energy manager from  Renal Team to Address Concerns Related to Transportation Barriers. CSW Collaboration with Transportation Lead to Address Concerns Related to Transportation Barriers. Please Complete Application for (RCATS) [facility] [employer] Transportation Services [phone](# [phone]) & Submit to ADTS (Aging, Disability & Transit Services [phone]), for Processing. Please Complete Application for Medicaid & Submit to [facility] of Social Services (# [phone]), for Processing. CSW Collaboration with Coralee Rudhonda Lucas, Nurse with [facility] [phone]), to Confirm Approval for Palliative Care Services. Please Contact CSW Directly (# [phone]), if You Have Questions, Need Assistance, or If Additional Social Work Needs Are Identified Between Now & Our Next Scheduled Telephone [employer] Call.      SDOH assessments and interventions completed:  Yes.  Care Coordination Interventions:  Yes, provided.   Follow up plan: Follow up call scheduled for 08/16/2022 at 1:45 pm.   Encounter Outcome:  Pt. Visit Completed.   Danford BadJoanna Yoceline Bazar, BSW, MSW, LCSW  Licensed Restaurant manager, fast foodClinical Social Worker  [facility] Management [place] [facility]  [address]ailing [place], [place] [address] Physical  Address-[address], [place], [place] [address] Toll Free Main # [phone] Fax # [phone] Cell # [phone] [email or web address]

## 2022-08-07 DIAGNOSIS — N186 End stage renal disease: Secondary | ICD-10-CM | POA: Diagnosis not present

## 2022-08-07 DIAGNOSIS — Z992 Dependence on renal dialysis: Secondary | ICD-10-CM | POA: Diagnosis not present

## 2022-08-08 NOTE — Progress Notes (Deleted)
Referring Provider: Richardean Chimera, MD Primary Care Physician:  Richardean Chimera, MD Primary GI Physician: Dr. Levon Hedger  No chief complaint on file.   HPI:   Sheri Cooper is a 72 y.o. female with history of COPD, RA, CKD on dialysis, anemia, peptic ulcer disease with perforated duodenal ulcer in January 2024 s/p laparotomy with Cheree Ditto patch, presenting today for follow-up.  Last seen in our office 06/16/2022 for hospital follow-up.  Reported she was feeling well without any significant GI symptoms.  Denies any current or prior NSAID use, alcohol, or tobacco use.  Etiology of PUD unclear as patient has denied NSAIDs, fasting gastrin normal. Plan to treat for H. pylori empirically with bismuth quadruple therapy and request recent blood work from hemodialysis.  Plan to follow-up in 8 weeks to discuss scheduling surveillance EGD.   Received labs from [facility].  Hemoglobin was 11.1 on 06/15/22.      Past Medical History:  Diagnosis Date   Chronic kidney disease (CKD), stage IV (severe)    COPD (chronic obstructive pulmonary disease)    PUD (peptic ulcer disease) 05/2022   Rheumatoid arthritis     Past Surgical History:  Procedure Laterality Date   CENTRAL VENOUS CATHETER INSERTION Left 05/27/2022   Procedure: INSERTION CENTRAL LINE ADULT;  Surgeon: Lucretia Roers, MD;  Location: [facility];  Service: General;  Laterality: Left;   COLONOSCOPY WITH PROPOFOL N/A 05/22/2022   Surgeon: Marguerita Merles, Reuel Boom, MD; blood in the terminal ileum and colon.   ESOPHAGOGASTRODUODENOSCOPY (EGD) WITH PROPOFOL N/A 05/22/2022   Surgeon: Marguerita Merles, Reuel Boom, MD; peptic ulcer disease with 2 nonbleeding cratered gastric ulcers, multiple duodenal ulcers, 2 of which had adherent clots that were removed and revealed 2 vessels.  1 vessel was clipped, cauterized, and treated with epinephrine injection.  Second vessel was clipped and treated with cautery, clip fell off.   IR FLUORO GUIDE CV  LINE RIGHT  05/24/2022   IR US GUIDE VASC ACCESS RIGHT  05/24/2022   LAPAROTOMY N/A 05/27/2022   Procedure: EXPLORATORY LAPAROTOMY, OMENTAL PATH, DRAIN PLACEMENT;  Surgeon: Lucretia Roers, MD;  Location: [facility];  Service: General;  Laterality: N/A;   VOLVULUS REDUCTION     colon resection    Current Outpatient Medications  Medication Sig Dispense Refill   amLODipine (NORVASC) 5 MG tablet Take 1 tablet (5 mg total) by mouth daily.     furosemide (LASIX) 40 MG tablet Take 2 tablets (80 mg total) by mouth daily.     hydrALAZINE (APRESOLINE) 100 MG tablet Take 0.5 tablets (50 mg total) by mouth 2 (two) times daily.     metoprolol succinate (TOPROL-XL) 50 MG 24 hr tablet Take 0.5 tablets (25 mg total) by mouth daily.     nystatin (MYCOSTATIN/NYSTOP) powder Apply topically 2 (two) times daily. Apply to affected area (under breast skin and skin folds) 15 g 0   oxyCODONE (ROXICODONE) 5 MG immediate release tablet Take 1-2 tablets (5-10 mg total) by mouth every 8 (eight) hours as needed for severe pain. 20 tablet 0   pantoprazole (PROTONIX) 40 MG tablet Take 1 tablet (40 mg total) by mouth 2 (two) times daily. 60 tablet 1   predniSONE (DELTASONE) 20 MG tablet Take 3 tablets (60 mg total) by mouth daily.     rosuvastatin (CRESTOR) 20 MG tablet Take 20 mg by mouth daily.     sertraline (ZOLOFT) 50 MG tablet Take 1 tablet by mouth daily.     No current facility-administered  medications for this visit.    Allergies as of 08/11/2022   (No Known Allergies)    No family history on file.  Social History   Socioeconomic History   Marital status: Significant Other    Spouse name: Not on file   Number of children: Not on file   Years of education: 12   Highest education level: 12th grade  Occupational History   Not on file  Tobacco Use   Smoking status: Former    Types: Cigarettes    Passive exposure: Past   Smokeless tobacco: Never  Vaping Use   Vaping Use: Never used  Substance and  Sexual Activity   Alcohol use: Not Currently   Drug use: Never   Sexual activity: Not Currently  Other Topics Concern   Not on file  Social History Narrative   Not on file   Social Determinants of Health   Financial Resource Strain: Low Risk  (08/02/2022)   Overall Financial Resource Strain (CARDIA)    Difficulty of Paying Living Expenses: Not hard at all  Food Insecurity: No Food Insecurity (08/02/2022)   Hunger Vital Sign    Worried About Running Out of Food in the Last Year: Never true    Ran Out of Food in the Last Year: Never true  Transportation Needs: Unmet Transportation Needs (08/06/2022)   PRAPARE - Administrator, Civil Service (Medical): Yes    Lack of Transportation (Non-Medical): No  Physical Activity: Inactive (08/02/2022)   Exercise Vital Sign    Days of Exercise per Week: 0 days    Minutes of Exercise per Session: 0 min  Stress: No Stress Concern Present (08/02/2022)   [employer] of Occupational Health - Occupational Stress Questionnaire    Feeling of Stress : Only a little  Social Connections: Unknown (08/02/2022)   Social Connection and Isolation Panel [NHANES]    Frequency of Communication with Friends and Family: More than three times a week    Frequency of Social Gatherings with Friends and Family: More than three times a week    Attends Religious Services: Never    Database administrator or Organizations: No    Attends Banker Meetings: Never    Marital Status: Not on file    Review of Systems: Gen: Denies fever, chills, anorexia. Denies fatigue, weakness, weight loss.  CV: Denies chest pain, palpitations, syncope, peripheral edema, and claudication. Resp: Denies dyspnea at rest, cough, wheezing, coughing up blood, and pleurisy. GI: Denies vomiting blood, jaundice, and fecal incontinence.   Denies dysphagia or odynophagia. Derm: Denies rash, itching, dry skin Psych: Denies depression, anxiety, memory loss, confusion. No homicidal  or suicidal ideation.  Heme: Denies bruising, bleeding, and enlarged lymph nodes.  Physical Exam: There were no vitals taken for this visit. General:   Alert and oriented. No distress noted. Pleasant and cooperative.  Head:  Normocephalic and atraumatic. Eyes:  Conjuctiva clear without scleral icterus. Heart:  S1, S2 present without murmurs appreciated. Lungs:  Clear to auscultation bilaterally. No wheezes, rales, or rhonchi. No distress.  Abdomen:  +BS, soft, non-tender and non-distended. No rebound or guarding. No HSM or masses noted. Msk:  Symmetrical without gross deformities. Normal posture. Extremities:  Without edema. Neurologic:  Alert and  oriented x4 Psych:  Normal mood and affect.    Assessment:     Plan:  ***   Ermalinda Memos, PA-C [facility] 08/11/2022

## 2022-08-09 ENCOUNTER — Telehealth: Payer: Self-pay | Admitting: *Deleted

## 2022-08-09 ENCOUNTER — Ambulatory Visit: Payer: Self-pay | Admitting: *Deleted

## 2022-08-09 DIAGNOSIS — N186 End stage renal disease: Secondary | ICD-10-CM | POA: Diagnosis not present

## 2022-08-09 DIAGNOSIS — Z6820 Body mass index (BMI) 20.0-20.9, adult: Secondary | ICD-10-CM | POA: Diagnosis not present

## 2022-08-09 DIAGNOSIS — J449 Chronic obstructive pulmonary disease, unspecified: Secondary | ICD-10-CM | POA: Diagnosis not present

## 2022-08-09 DIAGNOSIS — I10 Essential (primary) hypertension: Secondary | ICD-10-CM | POA: Diagnosis not present

## 2022-08-09 DIAGNOSIS — Z8711 Personal history of peptic ulcer disease: Secondary | ICD-10-CM | POA: Diagnosis not present

## 2022-08-09 DIAGNOSIS — Z7952 Long term (current) use of systemic steroids: Secondary | ICD-10-CM | POA: Diagnosis not present

## 2022-08-09 DIAGNOSIS — Z992 Dependence on renal dialysis: Secondary | ICD-10-CM | POA: Diagnosis not present

## 2022-08-09 DIAGNOSIS — Z720 Tobacco use: Secondary | ICD-10-CM | POA: Diagnosis not present

## 2022-08-09 DIAGNOSIS — L89312 Pressure ulcer of right buttock, stage 2: Secondary | ICD-10-CM | POA: Diagnosis not present

## 2022-08-09 DIAGNOSIS — M069 Rheumatoid arthritis, unspecified: Secondary | ICD-10-CM | POA: Diagnosis not present

## 2022-08-09 DIAGNOSIS — E46 Unspecified protein-calorie malnutrition: Secondary | ICD-10-CM | POA: Diagnosis not present

## 2022-08-09 DIAGNOSIS — E785 Hyperlipidemia, unspecified: Secondary | ICD-10-CM | POA: Diagnosis not present

## 2022-08-09 DIAGNOSIS — D631 Anemia in chronic kidney disease: Secondary | ICD-10-CM | POA: Diagnosis not present

## 2022-08-09 DIAGNOSIS — Z87440 Personal history of urinary (tract) infections: Secondary | ICD-10-CM | POA: Diagnosis not present

## 2022-08-09 DIAGNOSIS — N051 Unspecified nephritic syndrome with focal and segmental glomerular lesions: Secondary | ICD-10-CM | POA: Diagnosis not present

## 2022-08-09 NOTE — Patient Outreach (Signed)
  Care Coordination   08/09/2022 Name: Sheri Cooper MRN: [id number] DOB: [birth year]   Care Coordination Outreach Attempts:  An unsuccessful telephone outreach was attempted for a scheduled appointment today.  Follow Up Plan:  Additional outreach attempts will be made to offer the patient care coordination information and services.   Encounter Outcome:  No Answer   Care Coordination Interventions:  Yes, provided   Interventions Today    Flowsheet Row Most Recent Value  Chronic Disease   Chronic disease during today's visit Chronic Kidney Disease/End Stage Renal Disease (ESRD)  General Interventions   General Interventions Discussed/Reviewed Communication with  Communication with Social Work  [employer] re: transportation assistance and Education officer, environmental. Awaiting a call back from [facility] SW.]      Demetrios Loll, BSN, RN-BC RN Care Coordinator [facility]  [facility] Direct Dial: [phone] Main #: [phone]

## 2022-08-10 DIAGNOSIS — Z992 Dependence on renal dialysis: Secondary | ICD-10-CM | POA: Diagnosis not present

## 2022-08-10 DIAGNOSIS — N186 End stage renal disease: Secondary | ICD-10-CM | POA: Diagnosis not present

## 2022-08-11 ENCOUNTER — Encounter: Payer: Self-pay | Admitting: Gastroenterology

## 2022-08-11 ENCOUNTER — Ambulatory Visit: Payer: Medicare Other | Admitting: Gastroenterology

## 2022-08-11 DIAGNOSIS — E785 Hyperlipidemia, unspecified: Secondary | ICD-10-CM | POA: Diagnosis not present

## 2022-08-11 DIAGNOSIS — J449 Chronic obstructive pulmonary disease, unspecified: Secondary | ICD-10-CM | POA: Diagnosis not present

## 2022-08-11 DIAGNOSIS — N186 End stage renal disease: Secondary | ICD-10-CM | POA: Diagnosis not present

## 2022-08-11 DIAGNOSIS — Z720 Tobacco use: Secondary | ICD-10-CM | POA: Diagnosis not present

## 2022-08-11 DIAGNOSIS — Z87440 Personal history of urinary (tract) infections: Secondary | ICD-10-CM | POA: Diagnosis not present

## 2022-08-11 DIAGNOSIS — Z8711 Personal history of peptic ulcer disease: Secondary | ICD-10-CM | POA: Diagnosis not present

## 2022-08-11 DIAGNOSIS — E46 Unspecified protein-calorie malnutrition: Secondary | ICD-10-CM | POA: Diagnosis not present

## 2022-08-11 DIAGNOSIS — I10 Essential (primary) hypertension: Secondary | ICD-10-CM | POA: Diagnosis not present

## 2022-08-11 DIAGNOSIS — L89312 Pressure ulcer of right buttock, stage 2: Secondary | ICD-10-CM | POA: Diagnosis not present

## 2022-08-11 DIAGNOSIS — N051 Unspecified nephritic syndrome with focal and segmental glomerular lesions: Secondary | ICD-10-CM | POA: Diagnosis not present

## 2022-08-11 DIAGNOSIS — Z7952 Long term (current) use of systemic steroids: Secondary | ICD-10-CM | POA: Diagnosis not present

## 2022-08-11 DIAGNOSIS — M069 Rheumatoid arthritis, unspecified: Secondary | ICD-10-CM | POA: Diagnosis not present

## 2022-08-11 DIAGNOSIS — Z992 Dependence on renal dialysis: Secondary | ICD-10-CM | POA: Diagnosis not present

## 2022-08-11 DIAGNOSIS — D631 Anemia in chronic kidney disease: Secondary | ICD-10-CM | POA: Diagnosis not present

## 2022-08-11 DIAGNOSIS — Z6820 Body mass index (BMI) 20.0-20.9, adult: Secondary | ICD-10-CM | POA: Diagnosis not present

## 2022-08-12 DIAGNOSIS — N186 End stage renal disease: Secondary | ICD-10-CM | POA: Diagnosis not present

## 2022-08-12 DIAGNOSIS — Z992 Dependence on renal dialysis: Secondary | ICD-10-CM | POA: Diagnosis not present

## 2022-08-13 DIAGNOSIS — Z8711 Personal history of peptic ulcer disease: Secondary | ICD-10-CM | POA: Diagnosis not present

## 2022-08-13 DIAGNOSIS — E46 Unspecified protein-calorie malnutrition: Secondary | ICD-10-CM | POA: Diagnosis not present

## 2022-08-13 DIAGNOSIS — I10 Essential (primary) hypertension: Secondary | ICD-10-CM | POA: Diagnosis not present

## 2022-08-13 DIAGNOSIS — N186 End stage renal disease: Secondary | ICD-10-CM | POA: Diagnosis not present

## 2022-08-13 DIAGNOSIS — J449 Chronic obstructive pulmonary disease, unspecified: Secondary | ICD-10-CM | POA: Diagnosis not present

## 2022-08-13 DIAGNOSIS — D631 Anemia in chronic kidney disease: Secondary | ICD-10-CM | POA: Diagnosis not present

## 2022-08-13 DIAGNOSIS — Z7952 Long term (current) use of systemic steroids: Secondary | ICD-10-CM | POA: Diagnosis not present

## 2022-08-13 DIAGNOSIS — Z6820 Body mass index (BMI) 20.0-20.9, adult: Secondary | ICD-10-CM | POA: Diagnosis not present

## 2022-08-13 DIAGNOSIS — L89312 Pressure ulcer of right buttock, stage 2: Secondary | ICD-10-CM | POA: Diagnosis not present

## 2022-08-13 DIAGNOSIS — Z992 Dependence on renal dialysis: Secondary | ICD-10-CM | POA: Diagnosis not present

## 2022-08-13 DIAGNOSIS — N051 Unspecified nephritic syndrome with focal and segmental glomerular lesions: Secondary | ICD-10-CM | POA: Diagnosis not present

## 2022-08-13 DIAGNOSIS — M069 Rheumatoid arthritis, unspecified: Secondary | ICD-10-CM | POA: Diagnosis not present

## 2022-08-13 DIAGNOSIS — E785 Hyperlipidemia, unspecified: Secondary | ICD-10-CM | POA: Diagnosis not present

## 2022-08-13 DIAGNOSIS — Z720 Tobacco use: Secondary | ICD-10-CM | POA: Diagnosis not present

## 2022-08-13 DIAGNOSIS — Z87440 Personal history of urinary (tract) infections: Secondary | ICD-10-CM | POA: Diagnosis not present

## 2022-08-14 DIAGNOSIS — Z992 Dependence on renal dialysis: Secondary | ICD-10-CM | POA: Diagnosis not present

## 2022-08-14 DIAGNOSIS — N186 End stage renal disease: Secondary | ICD-10-CM | POA: Diagnosis not present

## 2022-08-16 ENCOUNTER — Encounter: Payer: Self-pay | Admitting: *Deleted

## 2022-08-16 ENCOUNTER — Ambulatory Visit: Payer: Self-pay | Admitting: *Deleted

## 2022-08-16 DIAGNOSIS — Z8711 Personal history of peptic ulcer disease: Secondary | ICD-10-CM | POA: Diagnosis not present

## 2022-08-16 DIAGNOSIS — Z992 Dependence on renal dialysis: Secondary | ICD-10-CM | POA: Diagnosis not present

## 2022-08-16 DIAGNOSIS — L89312 Pressure ulcer of right buttock, stage 2: Secondary | ICD-10-CM | POA: Diagnosis not present

## 2022-08-16 DIAGNOSIS — Z720 Tobacco use: Secondary | ICD-10-CM | POA: Diagnosis not present

## 2022-08-16 DIAGNOSIS — N051 Unspecified nephritic syndrome with focal and segmental glomerular lesions: Secondary | ICD-10-CM | POA: Diagnosis not present

## 2022-08-16 DIAGNOSIS — Z87440 Personal history of urinary (tract) infections: Secondary | ICD-10-CM | POA: Diagnosis not present

## 2022-08-16 DIAGNOSIS — D631 Anemia in chronic kidney disease: Secondary | ICD-10-CM | POA: Diagnosis not present

## 2022-08-16 DIAGNOSIS — J449 Chronic obstructive pulmonary disease, unspecified: Secondary | ICD-10-CM | POA: Diagnosis not present

## 2022-08-16 DIAGNOSIS — N186 End stage renal disease: Secondary | ICD-10-CM | POA: Diagnosis not present

## 2022-08-16 DIAGNOSIS — Z7952 Long term (current) use of systemic steroids: Secondary | ICD-10-CM | POA: Diagnosis not present

## 2022-08-16 DIAGNOSIS — E46 Unspecified protein-calorie malnutrition: Secondary | ICD-10-CM | POA: Diagnosis not present

## 2022-08-16 DIAGNOSIS — I10 Essential (primary) hypertension: Secondary | ICD-10-CM | POA: Diagnosis not present

## 2022-08-16 DIAGNOSIS — M069 Rheumatoid arthritis, unspecified: Secondary | ICD-10-CM | POA: Diagnosis not present

## 2022-08-16 DIAGNOSIS — Z6820 Body mass index (BMI) 20.0-20.9, adult: Secondary | ICD-10-CM | POA: Diagnosis not present

## 2022-08-16 DIAGNOSIS — E785 Hyperlipidemia, unspecified: Secondary | ICD-10-CM | POA: Diagnosis not present

## 2022-08-16 NOTE — Patient Instructions (Signed)
Visit Information  Thank you for taking time to visit with me today. Please don't hesitate to contact me if I can be of assistance to you.   Following are the goals we discussed today:   Goals Addressed               This Visit's Progress     Assist with Tax inspector and Resources. (pt-stated)   On track     Care Coordination Interventions:  Interventions Today    Flowsheet Row Most Recent Value  Chronic Disease   Chronic disease during today's visit Chronic Obstructive Pulmonary Disease (COPD), Chronic Kidney Disease/End Stage Renal Disease (ESRD), Other  [Transportation Deficits]  General Interventions   General Interventions Discussed/Reviewed General Interventions Discussed, General Interventions Reviewed, Annual Eye Exam, Durable Medical Equipment (DME), Community Resources, Level of Care, Communication with, Doctor Visits, Vaccines, Health Screening  [Communication with Primary Care Provider]  Vaccines COVID-19, Flu, Pneumonia, RSV, Shingles, Tetanus/Pertussis/Diphtheria  [Encouraged]  Doctor Visits Discussed/Reviewed Doctor Visits Discussed, Doctor Visits Reviewed, Annual Wellness Visits, PCP, Specialist  [Encouraged]  Health Screening Bone Density, Colonoscopy, Mammogram  [Encouraged]  Durable Medical Equipment (DME) Dan Humphreys, Psychologist, forensic  PCP/Specialist Visits Compliance with follow-up visit  [Encouraged]  Communication with PCP/Specialists, RN  Level of Care Adult Daycare, Air traffic controller, Assisted Living, Personal Care Human resources officer Medicaid, Personal Care Services, FL-2, Other  [RCATS Application]  Exercise Interventions   Exercise Discussed/Reviewed Exercise Discussed, Assistive device use and maintanence, Exercise Reviewed, Physical Activity, Weight Managment  [Encouraged]  Physical Activity Discussed/Reviewed Physical Activity Discussed, Home Exercise Program (HEP), Physical Activity Reviewed, Types of exercise   [Encouraged]  Weight Management Weight maintenance  [Encouraged]  Education Interventions   Education Provided Provided Therapist, sports, Provided Web-based Education, Provided Education  Provided Verbal Education On Nutrition, Mental Health/Coping with Illness, Applications, Exercise, Medication, Development worker, community, [employer], When to see the Scientist, research (physical sciences) Medicaid, Personal Care Services, FL-2, Other  [RCATS Application]  Mental Health Interventions   Mental Health Discussed/Reviewed Mental Health Discussed, Anxiety, Depression, Grief and Loss, Substance Abuse, Suicide, Crisis, Coping Strategies, Mental Health Reviewed  Nutrition Interventions   Nutrition Discussed/Reviewed Nutrition Discussed, Nutrition Reviewed, Adding fruits and vegetables, Fluid intake, Portion sizes, Decreasing sugar intake, Supplmental nutrition, Decreasing salt, Decreasing fats, Increaing proteins  [Encouraged]  Pharmacy Interventions   Pharmacy Dicussed/Reviewed Pharmacy Topics Discussed, Medication Adherence, Pharmacy Topics Reviewed, Affording Medications  [Encouraged]  Safety Interventions   Safety Discussed/Reviewed Safety Discussed, Safety Reviewed, Fall Risk, Home Safety  [Encouraged]  Home Safety Assistive Devices, Need for home safety assessment, Refer for community resources  Advanced Directive Interventions   Advanced Directives Discussed/Reviewed Advanced Directives Discussed, Advanced Directives Reviewed  [Completion Encouraged]     Active Listening & Reflection Utilized.  Verbalization of Feelings Encouraged.  Emotional Support Provided. Feelings of Frustration Validated. Caregiver Stress Acknowledged. Caregiver Resources Reviewed. Caregiver Support Groups Provided. Self-Enrollment in Caregiver Support Group of Interest Emphasized. Crisis Support Information, Agencies, Services & Resources Revisited. Problem Solving Interventions Activated. Task-Centered Solutions Employed.    Solution-Focused Strategies Implemented. Acceptance & Commitment Therapy Indicated. Cognitive Behavioral Therapy Initiated. Client-Centered Therapy Enacted. CSW Energy manager from [facility] [phone]), to [facility], Physical Therapy, Occupational Therapy, Social Work & [employer] Arranged & In [place].   Encouraged Use of Recently Obtained Durable Medical Equipment for Home Use, Which Includes: 3-in-1/Bedside Commode, Wheelchair & Walker.   Please Complete Application for (RCATS) [facility] [employer] [phone]) &  Submit to ADTS (Aging, Disability & Transit Services [phone]), for Processing. Please Complete Application for Medicaid & Submit to [facility] of Social Services (# [phone]), for Processing. CSW Collaboration with Coralee Rud, Nurse with [facility] [phone]), to [facility] Palliative Care Services Arranged & in Place.   CSW Collaboration with Coralee Rud, Nurse with [facility] [phone]), to [facility] Palliative Care Social Worker Will Complete Advanced Directives (Living Will & Healthcare Power of Attorney Documents), with Patient & Family & Provide Primary Care Provider, Dr. Donzetta Sprung with [facility] (# [phone]) Copy to Scan Into Electronic Medical Record in Epic. Please Contact CSW Directly (# [phone]), if You Have Questions, Need Assistance, or If Additional Social Work Needs Are Identified Between Now & Our Next Scheduled Telephone [employer].      Our next appointment is by telephone on 08/30/2022 at 1:30 pm.   Please call the care guide team at [phone] if you need to cancel or reschedule your appointment.   If you are experiencing a Mental Health or Behavioral Health Crisis or need someone to talk to, please call the Suicide and Crisis Lifeline: 988 call  the [place] National Suicide Prevention Lifeline: 605 220 8883 or TTY: 551-573-3359 TTY (951)035-8988) to talk to a trained counselor call 1-800-273-TALK (toll free, 24 hour hotline) go to [facility] [address] [place] [phone]) call the [facility] Crisis Line: [phone] call 911  Patient verbalizes understanding of instructions and care plan provided today and agrees to view in MyChart. Active MyChart status and patient understanding of how to access instructions and care plan via MyChart confirmed with patient.     Telephone follow up appointment with care management team member scheduled for:  08/30/2022 at 1:30 pm.   Danford Bad, BSW, MSW, LCSW  Licensed Clinical Social Worker  [facility]  Mailing [place]. [address] [place], [place] [address] Physical Address-[address], [place], [place] [address] Toll Free Main # [phone] Fax # [phone] Cell # [phone] [email or web address]

## 2022-08-16 NOTE — Patient Outreach (Signed)
Care Coordination   Follow Up Visit Note   08/16/2022  Name: Sheri Cooper MRN: [id number] DOB: 1951/01/05  Sheri Cooper is a 72 y.o. year old female who sees Richardean Chimera, MD for primary care. I spoke with Sheri Ochs Shirah by phone today.  What matters to the patients health and wellness today?  Assist with Tax inspector and Resources.   Goals Addressed               This Visit's Progress     Assist with Tax inspector and Resources. (pt-stated)   On track     Care Coordination Interventions:  Interventions Today    Flowsheet Row Most Recent Value  Chronic Disease   Chronic disease during today's visit Chronic Obstructive Pulmonary Disease (COPD), Chronic Kidney Disease/End Stage Renal Disease (ESRD), Other  [Transportation Deficits]  General Interventions   General Interventions Discussed/Reviewed General Interventions Discussed, General Interventions Reviewed, Annual Eye Exam, Durable Medical Equipment (DME), Community Resources, Level of Care, Communication with, Doctor Visits, Vaccines, Health Screening  [Communication with Primary Care Provider]  Vaccines COVID-19, Flu, Pneumonia, RSV, Shingles, Tetanus/Pertussis/Diphtheria  [Encouraged]  Doctor Visits Discussed/Reviewed Doctor Visits Discussed, Doctor Visits Reviewed, Annual Wellness Visits, PCP, Specialist  [Encouraged]  Health Screening Bone Density, Colonoscopy, Mammogram  [Encouraged]  Durable Medical Equipment (DME) Dan Humphreys, Psychologist, forensic  PCP/Specialist Visits Compliance with follow-up visit  [Encouraged]  Communication with PCP/Specialists, RN  Level of Care Adult Daycare, Air traffic controller, Assisted Living, Personal Care Human resources officer Medicaid, Personal Care Services, [place], Other  [RCATS Application]  Exercise Interventions   Exercise Discussed/Reviewed Exercise Discussed, Assistive device use and maintanence, Exercise Reviewed,  Physical Activity, Weight Managment  [Encouraged]  Physical Activity Discussed/Reviewed Physical Activity Discussed, Home Exercise Program (HEP), Physical Activity Reviewed, Types of exercise  [Encouraged]  Weight Management Weight maintenance  [Encouraged]  Education Interventions   Education Provided Provided Therapist, sports, Provided Web-based Education, Provided Education  Provided Verbal Education On Nutrition, Mental Health/Coping with Illness, Applications, Exercise, Medication, Development worker, community, [employer], When to see the Scientist, research (physical sciences) Medicaid, Personal Care Services, [place], Other  [RCATS Application]  Mental Health Interventions   Mental Health Discussed/Reviewed Mental Health Discussed, Anxiety, Depression, Grief and Loss, Substance Abuse, Suicide, Crisis, Coping Strategies, Mental Health Reviewed  Nutrition Interventions   Nutrition Discussed/Reviewed Nutrition Discussed, Nutrition Reviewed, Adding fruits and vegetables, Fluid intake, Portion sizes, Decreasing sugar intake, Supplmental nutrition, Decreasing salt, Decreasing fats, Increaing proteins  [Encouraged]  Pharmacy Interventions   Pharmacy Dicussed/Reviewed Pharmacy Topics Discussed, Medication Adherence, Pharmacy Topics Reviewed, Affording Medications  [Encouraged]  Safety Interventions   Safety Discussed/Reviewed Safety Discussed, Safety Reviewed, Fall Risk, Home Safety  [Encouraged]  Home Safety Assistive Devices, Need for home safety assessment, Refer for community resources  Advanced Directive Interventions   Advanced Directives Discussed/Reviewed Advanced Directives Discussed, Advanced Directives Reviewed  [Completion Encouraged]     Active Listening & Reflection Utilized.  Verbalization of Feelings Encouraged.  Emotional Support Provided. Feelings of Frustration Validated. Caregiver Stress Acknowledged. Caregiver Resources Reviewed. Caregiver Support Groups Provided. Self-Enrollment in Caregiver  Support Group of Interest Emphasized. Crisis Support Information, Agencies, Services & Resources Revisited. Problem Solving Interventions Activated. Task-Centered Solutions Employed.   Solution-Focused Strategies Implemented. Acceptance & Commitment Therapy Indicated. Cognitive Behavioral Therapy Initiated. Client-Centered Therapy Enacted. CSW Energy manager from [facility] [phone]), to [facility] Skilled Nursing, Physical Therapy, Occupational Therapy, Social Work & [employer] Arranged & In [place].   Encouraged Use  of Recently Obtained Durable Medical Equipment for Home Use, Which Includes: 3-in-1/Bedside Commode, Wheelchair & Walker.   Please Complete Application for (RCATS) [facility] [employer] [phone]) & Submit to ADTS (Aging, Disability & Transit Services [phone]), for Processing. Please Complete Application for Medicaid & Submit to [facility] of Social Services (# [phone]), for Processing. CSW Collaboration with Coralee Rud, Nurse with [facility] [phone]), to [facility] Palliative Care Services Arranged & in Place.   CSW Collaboration with Coralee Rud, Nurse with [facility] [phone]), to [facility] Palliative Care Social Worker Will Complete Advanced Directives (Living Will & Healthcare Power of Attorney Documents), with Patient & Family & Provide Primary Care Provider, Dr. Donzetta Sprung with [facility] (# [phone]) Copy to Scan Into Electronic Medical Record in Epic. Please Contact CSW Directly (# [phone]), if You Have Questions, Need Assistance, or If Additional Social Work Needs Are Identified Between Now & Our Next Scheduled Telephone [employer].      SDOH assessments and interventions completed:  Yes.  Care Coordination Interventions:  Yes, provided.   Follow up plan: Follow up call  scheduled for 08/30/2022 at 1:30 pm.   Encounter Outcome:  Pt. Visit Completed.   Sheri Cooper, BSW, MSW, LCSW  Licensed Restaurant manager, [facility]  Mailing [place] [place], [place] [address] Physical Address-[address] [place], [place] [address] Toll Free Main # [phone] Fax # [phone] Cell # [phone] [email or web address]

## 2022-08-17 ENCOUNTER — Emergency Department (HOSPITAL_COMMUNITY)
Admission: EM | Admit: 2022-08-17 | Discharge: 2022-08-17 | Disposition: A | Discharge status: Home / Self Care | Payer: Medicare Other | Attending: Emergency Medicine | Admitting: Emergency Medicine

## 2022-08-17 ENCOUNTER — Other Ambulatory Visit: Payer: Self-pay

## 2022-08-17 ENCOUNTER — Encounter (HOSPITAL_COMMUNITY): Payer: Self-pay | Admitting: Emergency Medicine

## 2022-08-17 DIAGNOSIS — R531 Weakness: Secondary | ICD-10-CM | POA: Diagnosis not present

## 2022-08-17 DIAGNOSIS — D649 Anemia, unspecified: Secondary | ICD-10-CM

## 2022-08-17 DIAGNOSIS — Z992 Dependence on renal dialysis: Secondary | ICD-10-CM | POA: Insufficient documentation

## 2022-08-17 DIAGNOSIS — R6889 Other general symptoms and signs: Secondary | ICD-10-CM | POA: Diagnosis not present

## 2022-08-17 DIAGNOSIS — N189 Chronic kidney disease, unspecified: Secondary | ICD-10-CM | POA: Diagnosis not present

## 2022-08-17 DIAGNOSIS — Z79899 Other long term (current) drug therapy: Secondary | ICD-10-CM | POA: Insufficient documentation

## 2022-08-17 DIAGNOSIS — R9431 Abnormal electrocardiogram [ECG] [EKG]: Secondary | ICD-10-CM | POA: Diagnosis not present

## 2022-08-17 DIAGNOSIS — Z743 Need for continuous supervision: Secondary | ICD-10-CM | POA: Diagnosis not present

## 2022-08-17 DIAGNOSIS — N186 End stage renal disease: Secondary | ICD-10-CM | POA: Diagnosis not present

## 2022-08-17 LAB — I-STAT CHEM 8, ED
BUN: 20 mg/dL (ref 8–23)
Calcium, Ion: 1.04 mmol/L — ABNORMAL LOW (ref 1.15–1.40)
Chloride: 97 mmol/L — ABNORMAL LOW (ref 98–111)
Creatinine, Ser: 1.5 mg/dL — ABNORMAL HIGH (ref 0.44–1.00)
Glucose, Bld: 121 mg/dL — ABNORMAL HIGH (ref 70–99)
HCT: 24 % — ABNORMAL LOW (ref 36.0–46.0)
Hemoglobin: 8.2 g/dL — ABNORMAL LOW (ref 12.0–15.0)
Potassium: 3.5 mmol/L (ref 3.5–5.1)
Sodium: 135 mmol/L (ref 135–145)
TCO2: 28 mmol/L (ref 22–32)

## 2022-08-17 LAB — POC OCCULT BLOOD, ED: Fecal Occult Blood: NEGATIVE

## 2022-08-17 LAB — RETICULOCYTES
Immature Retic Fract: 35.6 % — ABNORMAL HIGH (ref 2.3–15.9)
RBC.: 2.8 MIL/uL — ABNORMAL LOW (ref 3.87–5.11)
Retic Count, Absolute: 150.6 10*3/uL (ref 19.0–186.0)
Retic Ct Pct: 5.4 % — ABNORMAL HIGH (ref 0.4–3.1)

## 2022-08-17 LAB — IRON AND TIBC
Iron: 156 ug/dL (ref 28–170)
Saturation Ratios: 73 % — ABNORMAL HIGH (ref 10.4–31.8)
TIBC: 214 ug/dL — ABNORMAL LOW (ref 250–450)
UIBC: 58 ug/dL

## 2022-08-17 LAB — FOLATE: Folate: >40 ng/mL (ref >5.9)

## 2022-08-17 LAB — FERRITIN: Ferritin: 254 ng/mL (ref 11–307)

## 2022-08-17 LAB — VITAMIN B12: Vitamin B-12: 571 pg/mL (ref 180–914)

## 2022-08-17 NOTE — Discharge Instructions (Addendum)
Follow-up with your family doctor in the next couple weeks to recheck your hemoglobin

## 2022-08-17 NOTE — ED Triage Notes (Signed)
Pt from dialysis by EMS, rcvd full treatment today, normally resided at home, per dialysis low iron levels and Hgb trending down, VSS en route, pt denies blood in stools and pain

## 2022-08-17 NOTE — ED Provider Notes (Signed)
[place] EMERGENCY DEPARTMENT AT [facility] Provider Note   CSN: [id number] Arrival date & time: 08/17/22  1543     History {Add pertinent medical, surgical, social history, OB history to HPI:1} Chief Complaint  Patient presents with   abnormal labs    Sheri Cooper is a 72 y.o. female.  Patient is a dialysis patient.  She was sent from her dialysis center after she finished her dialysis because she had a low hemoglobin.   Weakness      Home Medications Prior to Admission medications   Medication Sig Start Date End Date Taking? Authorizing Provider  amLODipine (NORVASC) 10 MG tablet Take 10 mg by mouth daily.   Yes [provider]  atorvastatin (LIPITOR) 40 MG tablet Take 40 mg by mouth daily. 07/14/22  Yes [provider]  furosemide (LASIX) 40 MG tablet Take 2 tablets (80 mg total) by mouth daily. Patient taking differently: Take 40 mg by mouth 2 (two) times daily. 06/05/22 06/05/23 Yes Vassie Loll, MD  hydrALAZINE (APRESOLINE) 100 MG tablet Take 0.5 tablets (50 mg total) by mouth 2 (two) times daily. 06/05/22  Yes Vassie Loll, MD  hydrochlorothiazide (HYDRODIURIL) 25 MG tablet Take 25 mg by mouth daily.   Yes [provider]  metoprolol succinate (TOPROL-XL) 50 MG 24 hr tablet Take 0.5 tablets (25 mg total) by mouth daily. 06/05/22  Yes Vassie Loll, MD  oxyCODONE (ROXICODONE) 5 MG immediate release tablet Take 1-2 tablets (5-10 mg total) by mouth every 8 (eight) hours as needed for severe pain. 06/05/22 06/05/23 Yes Vassie Loll, MD  pantoprazole (PROTONIX) 40 MG tablet Take 1 tablet (40 mg total) by mouth 2 (two) times daily. 06/05/22 06/05/23 Yes Vassie Loll, MD  predniSONE (DELTASONE) 20 MG tablet Take 3 tablets (60 mg total) by mouth daily. Patient taking differently: Take 40 mg by mouth daily. 06/05/22  Yes Vassie Loll, MD  rosuvastatin (CRESTOR) 20 MG tablet Take 20 mg by mouth daily.   Yes [provider]   sertraline (ZOLOFT) 50 MG tablet Take 1 tablet by mouth daily. 05/12/22 08/17/22 Yes [provider]  hydrALAZINE (APRESOLINE) 50 MG tablet Take 50 mg by mouth 2 (two) times daily. Patient not taking: Reported on 08/17/2022 07/14/22   [provider]  nystatin (MYCOSTATIN/NYSTOP) powder Apply topically 2 (two) times daily. Apply to affected area (under breast skin and skin folds) Patient not taking: Reported on 08/17/2022 06/05/22   Vassie Loll, MD      Allergies    Patient has no known allergies.    Review of Systems   Review of Systems  Neurological:  Positive for weakness.    Physical Exam Updated Vital Signs BP (!) 202/71   Pulse 63   Temp 98.4 F (36.9 C) (Oral)   Resp 16   Ht  (1.473 m)   Wt 49.9 kg   SpO2 97%   BMI 22.99 kg/m  Physical Exam  ED Results / Procedures / Treatments   Labs (all labs ordered are listed, but only abnormal results are displayed) Labs Reviewed  I-STAT CHEM 8, ED - Abnormal; Notable for the following components:      Result Value   Chloride 97 (*)    Creatinine, Ser 1.50 (*)    Glucose, Bld 121 (*)    Calcium, Ion 1.04 (*)    Hemoglobin 8.2 (*)    HCT 24.0 (*)    All other components within normal limits  POC OCCULT BLOOD, ED  EKG None  Radiology No results found.  Procedures Procedures  {Document cardiac monitor, telemetry assessment procedure when appropriate:1}  Medications Ordered in ED Medications - No data to display  ED Course/ Medical Decision Making/ A&P  Rectal exam heme-negative {   Click here for ABCD2, HEART and other calculatorsREFRESH Note before signing :1}                          Medical Decision Making  Patient with anemia and negative rectal exam.  She will get anemia panel and will follow-up with her family doctor  {Document critical care time when appropriate:1} {Document review of labs and clinical decision tools ie heart score, Chads2Vasc2 etc:1}  {Document your  independent review of radiology images, and any outside records:1} {Document your discussion with family members, caretakers, and with consultants:1} {Document social determinants of health affecting pt's care:1} {Document your decision making why or why not admission, treatments were needed:1} Final Clinical Impression(s) / ED Diagnoses Final diagnoses:  Anemia, unspecified type    Rx / DC Orders ED Discharge Orders          Ordered    Anemia panel        08/17/22 1956

## 2022-08-18 DIAGNOSIS — D631 Anemia in chronic kidney disease: Secondary | ICD-10-CM | POA: Diagnosis not present

## 2022-08-18 DIAGNOSIS — Z87440 Personal history of urinary (tract) infections: Secondary | ICD-10-CM | POA: Diagnosis not present

## 2022-08-18 DIAGNOSIS — Z6820 Body mass index (BMI) 20.0-20.9, adult: Secondary | ICD-10-CM | POA: Diagnosis not present

## 2022-08-18 DIAGNOSIS — Z992 Dependence on renal dialysis: Secondary | ICD-10-CM | POA: Diagnosis not present

## 2022-08-18 DIAGNOSIS — L89312 Pressure ulcer of right buttock, stage 2: Secondary | ICD-10-CM | POA: Diagnosis not present

## 2022-08-18 DIAGNOSIS — Z8711 Personal history of peptic ulcer disease: Secondary | ICD-10-CM | POA: Diagnosis not present

## 2022-08-18 DIAGNOSIS — I10 Essential (primary) hypertension: Secondary | ICD-10-CM | POA: Diagnosis not present

## 2022-08-18 DIAGNOSIS — N186 End stage renal disease: Secondary | ICD-10-CM | POA: Diagnosis not present

## 2022-08-18 DIAGNOSIS — N051 Unspecified nephritic syndrome with focal and segmental glomerular lesions: Secondary | ICD-10-CM | POA: Diagnosis not present

## 2022-08-18 DIAGNOSIS — Z720 Tobacco use: Secondary | ICD-10-CM | POA: Diagnosis not present

## 2022-08-18 DIAGNOSIS — J449 Chronic obstructive pulmonary disease, unspecified: Secondary | ICD-10-CM | POA: Diagnosis not present

## 2022-08-18 DIAGNOSIS — E46 Unspecified protein-calorie malnutrition: Secondary | ICD-10-CM | POA: Diagnosis not present

## 2022-08-18 DIAGNOSIS — M069 Rheumatoid arthritis, unspecified: Secondary | ICD-10-CM | POA: Diagnosis not present

## 2022-08-18 DIAGNOSIS — Z7952 Long term (current) use of systemic steroids: Secondary | ICD-10-CM | POA: Diagnosis not present

## 2022-08-18 DIAGNOSIS — E785 Hyperlipidemia, unspecified: Secondary | ICD-10-CM | POA: Diagnosis not present

## 2022-08-19 DIAGNOSIS — Z992 Dependence on renal dialysis: Secondary | ICD-10-CM | POA: Diagnosis not present

## 2022-08-19 DIAGNOSIS — N186 End stage renal disease: Secondary | ICD-10-CM | POA: Diagnosis not present

## 2022-08-20 DIAGNOSIS — Z8711 Personal history of peptic ulcer disease: Secondary | ICD-10-CM | POA: Diagnosis not present

## 2022-08-20 DIAGNOSIS — L89312 Pressure ulcer of right buttock, stage 2: Secondary | ICD-10-CM | POA: Diagnosis not present

## 2022-08-20 DIAGNOSIS — J449 Chronic obstructive pulmonary disease, unspecified: Secondary | ICD-10-CM | POA: Diagnosis not present

## 2022-08-20 DIAGNOSIS — M069 Rheumatoid arthritis, unspecified: Secondary | ICD-10-CM | POA: Diagnosis not present

## 2022-08-20 DIAGNOSIS — Z720 Tobacco use: Secondary | ICD-10-CM | POA: Diagnosis not present

## 2022-08-20 DIAGNOSIS — N051 Unspecified nephritic syndrome with focal and segmental glomerular lesions: Secondary | ICD-10-CM | POA: Diagnosis not present

## 2022-08-20 DIAGNOSIS — E46 Unspecified protein-calorie malnutrition: Secondary | ICD-10-CM | POA: Diagnosis not present

## 2022-08-20 DIAGNOSIS — Z992 Dependence on renal dialysis: Secondary | ICD-10-CM | POA: Diagnosis not present

## 2022-08-20 DIAGNOSIS — E785 Hyperlipidemia, unspecified: Secondary | ICD-10-CM | POA: Diagnosis not present

## 2022-08-20 DIAGNOSIS — Z7952 Long term (current) use of systemic steroids: Secondary | ICD-10-CM | POA: Diagnosis not present

## 2022-08-20 DIAGNOSIS — I10 Essential (primary) hypertension: Secondary | ICD-10-CM | POA: Diagnosis not present

## 2022-08-20 DIAGNOSIS — Z87440 Personal history of urinary (tract) infections: Secondary | ICD-10-CM | POA: Diagnosis not present

## 2022-08-20 DIAGNOSIS — N186 End stage renal disease: Secondary | ICD-10-CM | POA: Diagnosis not present

## 2022-08-20 DIAGNOSIS — D631 Anemia in chronic kidney disease: Secondary | ICD-10-CM | POA: Diagnosis not present

## 2022-08-20 DIAGNOSIS — Z6820 Body mass index (BMI) 20.0-20.9, adult: Secondary | ICD-10-CM | POA: Diagnosis not present

## 2022-08-21 DIAGNOSIS — Z992 Dependence on renal dialysis: Secondary | ICD-10-CM | POA: Diagnosis not present

## 2022-08-21 DIAGNOSIS — N186 End stage renal disease: Secondary | ICD-10-CM | POA: Diagnosis not present

## 2022-08-23 DIAGNOSIS — Z6820 Body mass index (BMI) 20.0-20.9, adult: Secondary | ICD-10-CM | POA: Diagnosis not present

## 2022-08-23 DIAGNOSIS — E46 Unspecified protein-calorie malnutrition: Secondary | ICD-10-CM | POA: Diagnosis not present

## 2022-08-23 DIAGNOSIS — D631 Anemia in chronic kidney disease: Secondary | ICD-10-CM | POA: Diagnosis not present

## 2022-08-23 DIAGNOSIS — Z8711 Personal history of peptic ulcer disease: Secondary | ICD-10-CM | POA: Diagnosis not present

## 2022-08-23 DIAGNOSIS — L89312 Pressure ulcer of right buttock, stage 2: Secondary | ICD-10-CM | POA: Diagnosis not present

## 2022-08-23 DIAGNOSIS — J449 Chronic obstructive pulmonary disease, unspecified: Secondary | ICD-10-CM | POA: Diagnosis not present

## 2022-08-23 DIAGNOSIS — Z720 Tobacco use: Secondary | ICD-10-CM | POA: Diagnosis not present

## 2022-08-23 DIAGNOSIS — E785 Hyperlipidemia, unspecified: Secondary | ICD-10-CM | POA: Diagnosis not present

## 2022-08-23 DIAGNOSIS — I10 Essential (primary) hypertension: Secondary | ICD-10-CM | POA: Diagnosis not present

## 2022-08-23 DIAGNOSIS — Z7952 Long term (current) use of systemic steroids: Secondary | ICD-10-CM | POA: Diagnosis not present

## 2022-08-23 DIAGNOSIS — Z87440 Personal history of urinary (tract) infections: Secondary | ICD-10-CM | POA: Diagnosis not present

## 2022-08-23 DIAGNOSIS — N186 End stage renal disease: Secondary | ICD-10-CM | POA: Diagnosis not present

## 2022-08-23 DIAGNOSIS — M069 Rheumatoid arthritis, unspecified: Secondary | ICD-10-CM | POA: Diagnosis not present

## 2022-08-23 DIAGNOSIS — Z992 Dependence on renal dialysis: Secondary | ICD-10-CM | POA: Diagnosis not present

## 2022-08-23 DIAGNOSIS — N051 Unspecified nephritic syndrome with focal and segmental glomerular lesions: Secondary | ICD-10-CM | POA: Diagnosis not present

## 2022-08-24 DIAGNOSIS — N186 End stage renal disease: Secondary | ICD-10-CM | POA: Diagnosis not present

## 2022-08-24 DIAGNOSIS — Z992 Dependence on renal dialysis: Secondary | ICD-10-CM | POA: Diagnosis not present

## 2022-08-25 ENCOUNTER — Encounter: Payer: Medicare Other | Admitting: Vascular Surgery

## 2022-08-25 DIAGNOSIS — Z6820 Body mass index (BMI) 20.0-20.9, adult: Secondary | ICD-10-CM | POA: Diagnosis not present

## 2022-08-25 DIAGNOSIS — Z992 Dependence on renal dialysis: Secondary | ICD-10-CM | POA: Diagnosis not present

## 2022-08-25 DIAGNOSIS — E46 Unspecified protein-calorie malnutrition: Secondary | ICD-10-CM | POA: Diagnosis not present

## 2022-08-25 DIAGNOSIS — M069 Rheumatoid arthritis, unspecified: Secondary | ICD-10-CM | POA: Diagnosis not present

## 2022-08-25 DIAGNOSIS — N051 Unspecified nephritic syndrome with focal and segmental glomerular lesions: Secondary | ICD-10-CM | POA: Diagnosis not present

## 2022-08-25 DIAGNOSIS — L89312 Pressure ulcer of right buttock, stage 2: Secondary | ICD-10-CM | POA: Diagnosis not present

## 2022-08-25 DIAGNOSIS — Z8711 Personal history of peptic ulcer disease: Secondary | ICD-10-CM | POA: Diagnosis not present

## 2022-08-25 DIAGNOSIS — Z7952 Long term (current) use of systemic steroids: Secondary | ICD-10-CM | POA: Diagnosis not present

## 2022-08-25 DIAGNOSIS — N186 End stage renal disease: Secondary | ICD-10-CM | POA: Diagnosis not present

## 2022-08-25 DIAGNOSIS — J449 Chronic obstructive pulmonary disease, unspecified: Secondary | ICD-10-CM | POA: Diagnosis not present

## 2022-08-25 DIAGNOSIS — I10 Essential (primary) hypertension: Secondary | ICD-10-CM | POA: Diagnosis not present

## 2022-08-25 DIAGNOSIS — E785 Hyperlipidemia, unspecified: Secondary | ICD-10-CM | POA: Diagnosis not present

## 2022-08-25 DIAGNOSIS — Z720 Tobacco use: Secondary | ICD-10-CM | POA: Diagnosis not present

## 2022-08-25 DIAGNOSIS — D631 Anemia in chronic kidney disease: Secondary | ICD-10-CM | POA: Diagnosis not present

## 2022-08-25 DIAGNOSIS — Z87440 Personal history of urinary (tract) infections: Secondary | ICD-10-CM | POA: Diagnosis not present

## 2022-08-26 DIAGNOSIS — N186 End stage renal disease: Secondary | ICD-10-CM | POA: Diagnosis not present

## 2022-08-26 DIAGNOSIS — Z992 Dependence on renal dialysis: Secondary | ICD-10-CM | POA: Diagnosis not present

## 2022-08-27 DIAGNOSIS — D631 Anemia in chronic kidney disease: Secondary | ICD-10-CM | POA: Diagnosis not present

## 2022-08-27 DIAGNOSIS — Z8711 Personal history of peptic ulcer disease: Secondary | ICD-10-CM | POA: Diagnosis not present

## 2022-08-27 DIAGNOSIS — J449 Chronic obstructive pulmonary disease, unspecified: Secondary | ICD-10-CM | POA: Diagnosis not present

## 2022-08-27 DIAGNOSIS — E785 Hyperlipidemia, unspecified: Secondary | ICD-10-CM | POA: Diagnosis not present

## 2022-08-27 DIAGNOSIS — N186 End stage renal disease: Secondary | ICD-10-CM | POA: Diagnosis not present

## 2022-08-27 DIAGNOSIS — Z720 Tobacco use: Secondary | ICD-10-CM | POA: Diagnosis not present

## 2022-08-27 DIAGNOSIS — Z6820 Body mass index (BMI) 20.0-20.9, adult: Secondary | ICD-10-CM | POA: Diagnosis not present

## 2022-08-27 DIAGNOSIS — Z87440 Personal history of urinary (tract) infections: Secondary | ICD-10-CM | POA: Diagnosis not present

## 2022-08-27 DIAGNOSIS — L89312 Pressure ulcer of right buttock, stage 2: Secondary | ICD-10-CM | POA: Diagnosis not present

## 2022-08-27 DIAGNOSIS — Z7952 Long term (current) use of systemic steroids: Secondary | ICD-10-CM | POA: Diagnosis not present

## 2022-08-27 DIAGNOSIS — E46 Unspecified protein-calorie malnutrition: Secondary | ICD-10-CM | POA: Diagnosis not present

## 2022-08-27 DIAGNOSIS — Z992 Dependence on renal dialysis: Secondary | ICD-10-CM | POA: Diagnosis not present

## 2022-08-27 DIAGNOSIS — M069 Rheumatoid arthritis, unspecified: Secondary | ICD-10-CM | POA: Diagnosis not present

## 2022-08-27 DIAGNOSIS — N051 Unspecified nephritic syndrome with focal and segmental glomerular lesions: Secondary | ICD-10-CM | POA: Diagnosis not present

## 2022-08-27 DIAGNOSIS — I10 Essential (primary) hypertension: Secondary | ICD-10-CM | POA: Diagnosis not present

## 2022-08-28 DIAGNOSIS — N186 End stage renal disease: Secondary | ICD-10-CM | POA: Diagnosis not present

## 2022-08-28 DIAGNOSIS — Z992 Dependence on renal dialysis: Secondary | ICD-10-CM | POA: Diagnosis not present

## 2022-08-30 ENCOUNTER — Encounter: Payer: Self-pay | Admitting: *Deleted

## 2022-08-30 ENCOUNTER — Ambulatory Visit: Payer: Self-pay | Admitting: *Deleted

## 2022-08-30 DIAGNOSIS — J449 Chronic obstructive pulmonary disease, unspecified: Secondary | ICD-10-CM | POA: Diagnosis not present

## 2022-08-30 DIAGNOSIS — E46 Unspecified protein-calorie malnutrition: Secondary | ICD-10-CM | POA: Diagnosis not present

## 2022-08-30 DIAGNOSIS — Z87440 Personal history of urinary (tract) infections: Secondary | ICD-10-CM | POA: Diagnosis not present

## 2022-08-30 DIAGNOSIS — N051 Unspecified nephritic syndrome with focal and segmental glomerular lesions: Secondary | ICD-10-CM | POA: Diagnosis not present

## 2022-08-30 DIAGNOSIS — Z992 Dependence on renal dialysis: Secondary | ICD-10-CM | POA: Diagnosis not present

## 2022-08-30 DIAGNOSIS — Z8711 Personal history of peptic ulcer disease: Secondary | ICD-10-CM | POA: Diagnosis not present

## 2022-08-30 DIAGNOSIS — Z7952 Long term (current) use of systemic steroids: Secondary | ICD-10-CM | POA: Diagnosis not present

## 2022-08-30 DIAGNOSIS — M069 Rheumatoid arthritis, unspecified: Secondary | ICD-10-CM | POA: Diagnosis not present

## 2022-08-30 DIAGNOSIS — Z720 Tobacco use: Secondary | ICD-10-CM | POA: Diagnosis not present

## 2022-08-30 DIAGNOSIS — D631 Anemia in chronic kidney disease: Secondary | ICD-10-CM | POA: Diagnosis not present

## 2022-08-30 DIAGNOSIS — Z6820 Body mass index (BMI) 20.0-20.9, adult: Secondary | ICD-10-CM | POA: Diagnosis not present

## 2022-08-30 DIAGNOSIS — L89312 Pressure ulcer of right buttock, stage 2: Secondary | ICD-10-CM | POA: Diagnosis not present

## 2022-08-30 DIAGNOSIS — N186 End stage renal disease: Secondary | ICD-10-CM | POA: Diagnosis not present

## 2022-08-30 DIAGNOSIS — E785 Hyperlipidemia, unspecified: Secondary | ICD-10-CM | POA: Diagnosis not present

## 2022-08-30 DIAGNOSIS — I10 Essential (primary) hypertension: Secondary | ICD-10-CM | POA: Diagnosis not present

## 2022-08-30 NOTE — Patient Instructions (Signed)
Visit Information  Thank you for taking time to visit with me today. Please don't hesitate to contact me if I can be of assistance to you.   Following are the goals we discussed today:   Goals Addressed               This Visit's Progress     Assist with Tax inspector and Resources. (pt-stated)   On track     Care Coordination Interventions:  Interventions Today    Flowsheet Row Most Recent Value  Chronic Disease   Chronic disease during today's visit Chronic Obstructive Pulmonary Disease (COPD), Chronic Kidney Disease/End Stage Renal Disease (ESRD), Other  [Transportation Deficits]  General Interventions   General Interventions Discussed/Reviewed General Interventions Discussed, General Interventions Reviewed, Annual Eye Exam, Durable Medical Equipment (DME), Community Resources, Level of Care, Communication with, Doctor Visits, Vaccines, Health Screening  [Communication with Primary Care Provider]  Vaccines COVID-19, Flu, Pneumonia, RSV, Shingles, Tetanus/Pertussis/Diphtheria  [Encouraged]  Doctor Visits Discussed/Reviewed Doctor Visits Discussed, Doctor Visits Reviewed, Annual Wellness Visits, PCP, Specialist  [Encouraged]  Health Screening Bone Density, Colonoscopy, Mammogram  [Encouraged]  Durable Medical Equipment (DME) Dan Humphreys, Psychologist, forensic  PCP/Specialist Visits Compliance with follow-up visit  [Encouraged]  Communication with PCP/Specialists, RN  Level of Care Adult Daycare, Air traffic controller, Assisted Living, Personal Care Human resources officer Medicaid, Personal Care Services, [place]-2, Other  [RCATS Application]  Exercise Interventions   Exercise Discussed/Reviewed Exercise Discussed, Assistive device use and maintanence, Exercise Reviewed, Physical Activity, Weight Managment  [Encouraged]  Physical Activity Discussed/Reviewed Physical Activity Discussed, Home Exercise Program (HEP), Physical Activity Reviewed, Types of exercise   [Encouraged]  Weight Management Weight maintenance  [Encouraged]  Education Interventions   Education Provided Provided Therapist, sports, Provided Web-based Education, Provided Education  Provided Verbal Education On Nutrition, Mental Health/Coping with Illness, Applications, Exercise, Medication, Development worker, community, [employer], When to see the Scientist, research (physical sciences) Medicaid, Personal Care Services, [place]-2, Other  [RCATS Application]  Mental Health Interventions   Mental Health Discussed/Reviewed Mental Health Discussed, Anxiety, Depression, Grief and Loss, Substance Abuse, Suicide, Crisis, Coping Strategies, Mental Health Reviewed  Nutrition Interventions   Nutrition Discussed/Reviewed Nutrition Discussed, Nutrition Reviewed, Adding fruits and vegetables, Fluid intake, Portion sizes, Decreasing sugar intake, Supplmental nutrition, Decreasing salt, Decreasing fats, Increaing proteins  [Encouraged]  Pharmacy Interventions   Pharmacy Dicussed/Reviewed Pharmacy Topics Discussed, Medication Adherence, Pharmacy Topics Reviewed, Affording Medications  [Encouraged]  Safety Interventions   Safety Discussed/Reviewed Safety Discussed, Safety Reviewed, Fall Risk, Home Safety  [Encouraged]  Home Safety Assistive Devices, Need for home safety assessment, Refer for community resources  Advanced Directive Interventions   Advanced Directives Discussed/Reviewed Advanced Directives Discussed, Advanced Directives Reviewed  [Completion Encouraged]     Active Listening & Reflection Utilized.  Verbalization of Feelings Encouraged.  Emotional Support Provided. Feelings of Frustration Validated. Caregiver Stress Acknowledged. Caregiver Resources Reviewed. Caregiver Support Groups Provided. Self-Enrollment in Caregiver Support Group of Interest Emphasized. Problem Solving Interventions Activated. Task-Centered Solutions Employed.   Solution-Focused Strategies Implemented. Acceptance & Commitment Therapy  Indicated. Cognitive Behavioral Therapy Initiated. Client-Centered Therapy Performed. Confirmed Continued Involvement from Home Health Skilled Nurse, Physical Therapist, Occupational Therapist, Social Worker & [place], through [facility] [phone]).   Confirmed Completion of Application for (RCATS) [facility] [employer] [phone]), Submission to ADTS (Aging, Disability & Transit Services of [place] [phone]) for Processing & Approval for Assistance. Please Begin Utilizing (RCATS) [facility] [employer] [phone]), through ADTS (Aging, Disability &  Transit Services of [place] [phone]), for Transportation to Physician Appointments & Provider Practice Visits. Please Consider Completion of Application for Medicaid & Submission to [facility] of Social Services [phone]), for Processing. Confirmed Disinterest in Referral to Coralee Rud, Nurse with [facility] [phone]), to Receive Palliative Care Services in The Home.   Discussed Importance of Advanced Directives (Living Will & Healthcare Power of Attorney Documents), Encouraged Completion & Mailed Packet for Review & Consideration, on 08/30/2022. Please Review & Discuss Advanced Directives (Living Will & Healthcare Power of Attorney Documents) with Family & Be Prepared to Ask Questions & Receive Assistance with Completion, During Next Follow-Up Outreach Call with CSW, Scheduled on 09/13/2022 at 11:15 AM. Encouraged Submission of Completed, Signed & Notarized Advanced Directives (Living Will & Healthcare Power of Attorney Documents) to Primary Care Provider, Dr. Donzetta Sprung with [facility] (# [phone]), to Scan Into Electronic Medical Record in Epic. Please Contact CSW Directly (# [phone]), if You Have Questions, Need Assistance, or If Additional Social Work  Needs Are Identified.      Our next appointment is by telephone on 09/13/2022 at 11:15 am.  Please call the care guide team at [phone] if you need to cancel or reschedule your appointment.   If you are experiencing a Mental Health or Behavioral Health Crisis or need someone to talk to, please call the Suicide and Crisis Lifeline: 988 call the [place] National Suicide Prevention Lifeline: 916-264-7800 or TTY: (347) 675-6843 TTY 727 210 8128) to talk to a trained counselor call 1-800-273-TALK (toll free, 24 hour hotline) go to [facility] [address], [place] [phone]) call the [facility] Crisis Line: [phone] call 911  Patient verbalizes understanding of instructions and care plan provided today and agrees to view in MyChart. Active MyChart status and patient understanding of how to access instructions and care plan via MyChart confirmed with patient.     Telephone follow up appointment with care management team member scheduled for:  09/13/2022 at 11:15 am.  Danford Bad, BSW, MSW, LCSW  Licensed Clinical Social Worker  [employer] [facility] [facility]  Mailing [place]. [address] [place], [place] [address] Physical Address-[address] [place], [place] [address] Toll Free Main # [phone] Fax # [phone] Cell # [phone] [email or web address]

## 2022-08-30 NOTE — Patient Outreach (Signed)
Care Coordination   Follow Up Visit Note   08/30/2022  Name: Sheri Cooper MRN: [id number] DOB: 11-25-1950  Sheri Cooper is a 72 y.o. year old female who sees Richardean Chimera, MD for primary care. I spoke with Sheri Cooper by phone today.  What matters to the patients health and wellness today?  Assist with Tax inspector and Resources.   Goals Addressed               This Visit's Progress     Assist with Tax inspector and Resources. (pt-stated)   On track     Care Coordination Interventions:  Interventions Today    Flowsheet Row Most Recent Value  Chronic Disease   Chronic disease during today's visit Chronic Obstructive Pulmonary Disease (COPD), Chronic Kidney Disease/End Stage Renal Disease (ESRD), Other  [Transportation Deficits]  General Interventions   General Interventions Discussed/Reviewed General Interventions Discussed, General Interventions Reviewed, Annual Eye Exam, Durable Medical Equipment (DME), Community Resources, Level of Care, Communication with, Doctor Visits, Vaccines, Health Screening  [Communication with Primary Care Provider]  Vaccines COVID-19, Flu, Pneumonia, RSV, Shingles, Tetanus/Pertussis/Diphtheria  [Encouraged]  Doctor Visits Discussed/Reviewed Doctor Visits Discussed, Doctor Visits Reviewed, Annual Wellness Visits, PCP, Specialist  [Encouraged]  Health Screening Bone Density, Colonoscopy, Mammogram  [Encouraged]  Durable Medical Equipment (DME) Dan Humphreys, Psychologist, forensic  PCP/Specialist Visits Compliance with follow-up visit  [Encouraged]  Communication with PCP/Specialists, RN  Level of Care Adult Daycare, Air traffic controller, Assisted Living, Personal Care Human resources officer Medicaid, Personal Care Services, [place], Other  [RCATS Application]  Exercise Interventions   Exercise Discussed/Reviewed Exercise Discussed, Assistive device use and maintanence, Exercise Reviewed,  Physical Activity, Weight Managment  [Encouraged]  Physical Activity Discussed/Reviewed Physical Activity Discussed, Home Exercise Program (HEP), Physical Activity Reviewed, Types of exercise  [Encouraged]  Weight Management Weight maintenance  [Encouraged]  Education Interventions   Education Provided Provided Therapist, sports, Provided Web-based Education, Provided Education  Provided Verbal Education On Nutrition, Mental Health/Coping with Illness, Applications, Exercise, Medication, Development worker, community, [employer], When to see the Scientist, research (physical sciences) Medicaid, Personal Care Services, [place], Other  [RCATS Application]  Mental Health Interventions   Mental Health Discussed/Reviewed Mental Health Discussed, Anxiety, Depression, Grief and Loss, Substance Abuse, Suicide, Crisis, Coping Strategies, Mental Health Reviewed  Nutrition Interventions   Nutrition Discussed/Reviewed Nutrition Discussed, Nutrition Reviewed, Adding fruits and vegetables, Fluid intake, Portion sizes, Decreasing sugar intake, Supplmental nutrition, Decreasing salt, Decreasing fats, Increaing proteins  [Encouraged]  Pharmacy Interventions   Pharmacy Dicussed/Reviewed Pharmacy Topics Discussed, Medication Adherence, Pharmacy Topics Reviewed, Affording Medications  [Encouraged]  Safety Interventions   Safety Discussed/Reviewed Safety Discussed, Safety Reviewed, Fall Risk, Home Safety  [Encouraged]  Home Safety Assistive Devices, Need for home safety assessment, Refer for community resources  Advanced Directive Interventions   Advanced Directives Discussed/Reviewed Advanced Directives Discussed, Advanced Directives Reviewed  [Completion Encouraged]     Active Listening & Reflection Utilized.  Verbalization of Feelings Encouraged.  Emotional Support Provided. Feelings of Frustration Validated. Caregiver Stress Acknowledged. Caregiver Resources Reviewed. Caregiver Support Groups Provided. Self-Enrollment in Caregiver  Support Group of Interest Emphasized. Problem Solving Interventions Activated. Task-Centered Solutions Employed.   Solution-Focused Strategies Implemented. Acceptance & Commitment Therapy Indicated. Cognitive Behavioral Therapy Initiated. Client-Centered Therapy Performed. Confirmed Continued Involvement from Home Health Skilled Nurse, Physical Therapist, Occupational Therapist, Social Worker & [place], through [facility] [phone]).   Confirmed Completion of Application for (RCATS) [facility] [employer] [phone]), Submission to ADTS (Aging,  Disability & Transit Services of [place] [phone]) for Processing & Approval for Assistance. Please Begin Utilizing (RCATS) [facility] [employer] [phone]), through ADTS (Aging, Disability & Transit Services of [place] [phone]), for Transportation to Physician Appointments & Provider Practice Visits. Please Consider Completion of Application for Medicaid & Submission to [facility] of Social Services [phone]), for Processing. Confirmed Disinterest in Referral to Coralee Rud, Nurse with [facility] [phone]), to Receive Palliative Care Services in The Home.   Discussed Importance of Advanced Directives (Living Will & Healthcare Power of Attorney Documents), Encouraged Completion & Mailed Packet for Review & Consideration, on 08/30/2022. Please Review & Discuss Advanced Directives (Living Will & Healthcare Power of Attorney Documents) with Family & Be Prepared to Ask Questions & Receive Assistance with Completion, During Next Follow-Up Outreach Call with CSW, Scheduled on 09/13/2022 at 11:15 AM. Encouraged Submission of Completed, Signed & Notarized Advanced Directives (Living Will & Healthcare Power of Attorney Documents) to Primary Care Provider, Dr. Donzetta Sprung with [facility] (# [phone]), to Scan Into Electronic Medical Record in Epic. Please Contact CSW Directly (# [phone]), if You Have Questions, Need Assistance, or If Additional Social Work Needs Are Identified.      SDOH assessments and interventions completed:  Yes.  Care Coordination Interventions:  Yes, provided.   Follow up plan: Follow up call scheduled for 09/13/2022 at 11:15 am.  Encounter Outcome:  Pt. Visit Completed.   Danford Bad, BSW, MSW, LCSW  Licensed Restaurant manager, [employer] [facility]  Mailing [place] [address], [place], [place] [address] Physical Address-[address], [place], [place] [address] Toll Free Main # [phone] Fax # [phone] Cell # [phone] [email or web address]

## 2022-08-31 DIAGNOSIS — Z992 Dependence on renal dialysis: Secondary | ICD-10-CM | POA: Diagnosis not present

## 2022-08-31 DIAGNOSIS — N186 End stage renal disease: Secondary | ICD-10-CM | POA: Diagnosis not present

## 2022-09-01 DIAGNOSIS — Z8711 Personal history of peptic ulcer disease: Secondary | ICD-10-CM | POA: Diagnosis not present

## 2022-09-01 DIAGNOSIS — Z6820 Body mass index (BMI) 20.0-20.9, adult: Secondary | ICD-10-CM | POA: Diagnosis not present

## 2022-09-01 DIAGNOSIS — N186 End stage renal disease: Secondary | ICD-10-CM | POA: Diagnosis not present

## 2022-09-01 DIAGNOSIS — L89312 Pressure ulcer of right buttock, stage 2: Secondary | ICD-10-CM | POA: Diagnosis not present

## 2022-09-01 DIAGNOSIS — E46 Unspecified protein-calorie malnutrition: Secondary | ICD-10-CM | POA: Diagnosis not present

## 2022-09-01 DIAGNOSIS — N051 Unspecified nephritic syndrome with focal and segmental glomerular lesions: Secondary | ICD-10-CM | POA: Diagnosis not present

## 2022-09-01 DIAGNOSIS — I10 Essential (primary) hypertension: Secondary | ICD-10-CM | POA: Diagnosis not present

## 2022-09-01 DIAGNOSIS — M069 Rheumatoid arthritis, unspecified: Secondary | ICD-10-CM | POA: Diagnosis not present

## 2022-09-01 DIAGNOSIS — Z720 Tobacco use: Secondary | ICD-10-CM | POA: Diagnosis not present

## 2022-09-01 DIAGNOSIS — Z992 Dependence on renal dialysis: Secondary | ICD-10-CM | POA: Diagnosis not present

## 2022-09-01 DIAGNOSIS — D631 Anemia in chronic kidney disease: Secondary | ICD-10-CM | POA: Diagnosis not present

## 2022-09-01 DIAGNOSIS — Z7952 Long term (current) use of systemic steroids: Secondary | ICD-10-CM | POA: Diagnosis not present

## 2022-09-01 DIAGNOSIS — E785 Hyperlipidemia, unspecified: Secondary | ICD-10-CM | POA: Diagnosis not present

## 2022-09-01 DIAGNOSIS — Z87440 Personal history of urinary (tract) infections: Secondary | ICD-10-CM | POA: Diagnosis not present

## 2022-09-01 DIAGNOSIS — J449 Chronic obstructive pulmonary disease, unspecified: Secondary | ICD-10-CM | POA: Diagnosis not present

## 2022-09-02 DIAGNOSIS — Z992 Dependence on renal dialysis: Secondary | ICD-10-CM | POA: Diagnosis not present

## 2022-09-02 DIAGNOSIS — N186 End stage renal disease: Secondary | ICD-10-CM | POA: Diagnosis not present

## 2022-09-03 DIAGNOSIS — Z8711 Personal history of peptic ulcer disease: Secondary | ICD-10-CM | POA: Diagnosis not present

## 2022-09-03 DIAGNOSIS — D631 Anemia in chronic kidney disease: Secondary | ICD-10-CM | POA: Diagnosis not present

## 2022-09-03 DIAGNOSIS — L89312 Pressure ulcer of right buttock, stage 2: Secondary | ICD-10-CM | POA: Diagnosis not present

## 2022-09-03 DIAGNOSIS — Z992 Dependence on renal dialysis: Secondary | ICD-10-CM | POA: Diagnosis not present

## 2022-09-03 DIAGNOSIS — M069 Rheumatoid arthritis, unspecified: Secondary | ICD-10-CM | POA: Diagnosis not present

## 2022-09-03 DIAGNOSIS — J449 Chronic obstructive pulmonary disease, unspecified: Secondary | ICD-10-CM | POA: Diagnosis not present

## 2022-09-03 DIAGNOSIS — Z87440 Personal history of urinary (tract) infections: Secondary | ICD-10-CM | POA: Diagnosis not present

## 2022-09-03 DIAGNOSIS — N051 Unspecified nephritic syndrome with focal and segmental glomerular lesions: Secondary | ICD-10-CM | POA: Diagnosis not present

## 2022-09-03 DIAGNOSIS — Z720 Tobacco use: Secondary | ICD-10-CM | POA: Diagnosis not present

## 2022-09-03 DIAGNOSIS — I10 Essential (primary) hypertension: Secondary | ICD-10-CM | POA: Diagnosis not present

## 2022-09-03 DIAGNOSIS — Z6820 Body mass index (BMI) 20.0-20.9, adult: Secondary | ICD-10-CM | POA: Diagnosis not present

## 2022-09-03 DIAGNOSIS — E46 Unspecified protein-calorie malnutrition: Secondary | ICD-10-CM | POA: Diagnosis not present

## 2022-09-03 DIAGNOSIS — Z7952 Long term (current) use of systemic steroids: Secondary | ICD-10-CM | POA: Diagnosis not present

## 2022-09-03 DIAGNOSIS — E785 Hyperlipidemia, unspecified: Secondary | ICD-10-CM | POA: Diagnosis not present

## 2022-09-03 DIAGNOSIS — N186 End stage renal disease: Secondary | ICD-10-CM | POA: Diagnosis not present

## 2022-09-04 DIAGNOSIS — N186 End stage renal disease: Secondary | ICD-10-CM | POA: Diagnosis not present

## 2022-09-04 DIAGNOSIS — Z992 Dependence on renal dialysis: Secondary | ICD-10-CM | POA: Diagnosis not present

## 2022-09-06 DIAGNOSIS — Z720 Tobacco use: Secondary | ICD-10-CM | POA: Diagnosis not present

## 2022-09-06 DIAGNOSIS — Z992 Dependence on renal dialysis: Secondary | ICD-10-CM | POA: Diagnosis not present

## 2022-09-06 DIAGNOSIS — E46 Unspecified protein-calorie malnutrition: Secondary | ICD-10-CM | POA: Diagnosis not present

## 2022-09-06 DIAGNOSIS — N051 Unspecified nephritic syndrome with focal and segmental glomerular lesions: Secondary | ICD-10-CM | POA: Diagnosis not present

## 2022-09-06 DIAGNOSIS — M069 Rheumatoid arthritis, unspecified: Secondary | ICD-10-CM | POA: Diagnosis not present

## 2022-09-06 DIAGNOSIS — Z8711 Personal history of peptic ulcer disease: Secondary | ICD-10-CM | POA: Diagnosis not present

## 2022-09-06 DIAGNOSIS — E785 Hyperlipidemia, unspecified: Secondary | ICD-10-CM | POA: Diagnosis not present

## 2022-09-06 DIAGNOSIS — L89312 Pressure ulcer of right buttock, stage 2: Secondary | ICD-10-CM | POA: Diagnosis not present

## 2022-09-06 DIAGNOSIS — N186 End stage renal disease: Secondary | ICD-10-CM | POA: Diagnosis not present

## 2022-09-06 DIAGNOSIS — D631 Anemia in chronic kidney disease: Secondary | ICD-10-CM | POA: Diagnosis not present

## 2022-09-06 DIAGNOSIS — I10 Essential (primary) hypertension: Secondary | ICD-10-CM | POA: Diagnosis not present

## 2022-09-06 DIAGNOSIS — J449 Chronic obstructive pulmonary disease, unspecified: Secondary | ICD-10-CM | POA: Diagnosis not present

## 2022-09-06 DIAGNOSIS — Z6820 Body mass index (BMI) 20.0-20.9, adult: Secondary | ICD-10-CM | POA: Diagnosis not present

## 2022-09-06 DIAGNOSIS — Z87440 Personal history of urinary (tract) infections: Secondary | ICD-10-CM | POA: Diagnosis not present

## 2022-09-06 DIAGNOSIS — Z7952 Long term (current) use of systemic steroids: Secondary | ICD-10-CM | POA: Diagnosis not present

## 2022-09-07 ENCOUNTER — Emergency Department (HOSPITAL_COMMUNITY): Payer: Medicare Other

## 2022-09-07 ENCOUNTER — Inpatient Hospital Stay (HOSPITAL_COMMUNITY)
Admission: EM | Admit: 2022-09-07 | Discharge: 2022-09-09 | DRG: 193 | Disposition: A | Discharge status: Home Health | Payer: Medicare Other | Attending: Family Medicine | Admitting: Family Medicine

## 2022-09-07 ENCOUNTER — Other Ambulatory Visit: Payer: Self-pay

## 2022-09-07 ENCOUNTER — Encounter (HOSPITAL_COMMUNITY): Payer: Self-pay | Admitting: Internal Medicine

## 2022-09-07 DIAGNOSIS — D649 Anemia, unspecified: Secondary | ICD-10-CM | POA: Diagnosis present

## 2022-09-07 DIAGNOSIS — E876 Hypokalemia: Secondary | ICD-10-CM | POA: Diagnosis not present

## 2022-09-07 DIAGNOSIS — R0603 Acute respiratory distress: Secondary | ICD-10-CM | POA: Diagnosis not present

## 2022-09-07 DIAGNOSIS — D631 Anemia in chronic kidney disease: Secondary | ICD-10-CM | POA: Diagnosis not present

## 2022-09-07 DIAGNOSIS — Z8711 Personal history of peptic ulcer disease: Secondary | ICD-10-CM | POA: Diagnosis not present

## 2022-09-07 DIAGNOSIS — J440 Chronic obstructive pulmonary disease with acute lower respiratory infection: Secondary | ICD-10-CM | POA: Diagnosis not present

## 2022-09-07 DIAGNOSIS — J181 Lobar pneumonia, unspecified organism: Secondary | ICD-10-CM | POA: Diagnosis present

## 2022-09-07 DIAGNOSIS — R0602 Shortness of breath: Secondary | ICD-10-CM | POA: Diagnosis not present

## 2022-09-07 DIAGNOSIS — N2581 Secondary hyperparathyroidism of renal origin: Secondary | ICD-10-CM | POA: Diagnosis present

## 2022-09-07 DIAGNOSIS — Z7952 Long term (current) use of systemic steroids: Secondary | ICD-10-CM

## 2022-09-07 DIAGNOSIS — Z66 Do not resuscitate: Secondary | ICD-10-CM | POA: Diagnosis present

## 2022-09-07 DIAGNOSIS — J189 Pneumonia, unspecified organism: Secondary | ICD-10-CM

## 2022-09-07 DIAGNOSIS — J90 Pleural effusion, not elsewhere classified: Secondary | ICD-10-CM | POA: Diagnosis present

## 2022-09-07 DIAGNOSIS — Z79899 Other long term (current) drug therapy: Secondary | ICD-10-CM | POA: Diagnosis not present

## 2022-09-07 DIAGNOSIS — I120 Hypertensive chronic kidney disease with stage 5 chronic kidney disease or end stage renal disease: Secondary | ICD-10-CM | POA: Diagnosis not present

## 2022-09-07 DIAGNOSIS — Z992 Dependence on renal dialysis: Secondary | ICD-10-CM | POA: Diagnosis not present

## 2022-09-07 DIAGNOSIS — Y95 Nosocomial condition: Secondary | ICD-10-CM | POA: Diagnosis present

## 2022-09-07 DIAGNOSIS — R0609 Other forms of dyspnea: Secondary | ICD-10-CM | POA: Diagnosis not present

## 2022-09-07 DIAGNOSIS — M069 Rheumatoid arthritis, unspecified: Secondary | ICD-10-CM | POA: Diagnosis present

## 2022-09-07 DIAGNOSIS — I10 Essential (primary) hypertension: Secondary | ICD-10-CM | POA: Diagnosis not present

## 2022-09-07 DIAGNOSIS — N250 Renal osteodystrophy: Secondary | ICD-10-CM | POA: Diagnosis not present

## 2022-09-07 DIAGNOSIS — R0902 Hypoxemia: Secondary | ICD-10-CM | POA: Diagnosis not present

## 2022-09-07 DIAGNOSIS — N186 End stage renal disease: Secondary | ICD-10-CM | POA: Diagnosis not present

## 2022-09-07 DIAGNOSIS — J9601 Acute respiratory failure with hypoxia: Secondary | ICD-10-CM | POA: Diagnosis not present

## 2022-09-07 DIAGNOSIS — Z743 Need for continuous supervision: Secondary | ICD-10-CM | POA: Diagnosis not present

## 2022-09-07 DIAGNOSIS — J441 Chronic obstructive pulmonary disease with (acute) exacerbation: Secondary | ICD-10-CM | POA: Diagnosis present

## 2022-09-07 DIAGNOSIS — M898X9 Other specified disorders of bone, unspecified site: Secondary | ICD-10-CM | POA: Diagnosis present

## 2022-09-07 DIAGNOSIS — E8770 Fluid overload, unspecified: Secondary | ICD-10-CM | POA: Diagnosis present

## 2022-09-07 DIAGNOSIS — Z87891 Personal history of nicotine dependence: Secondary | ICD-10-CM | POA: Diagnosis not present

## 2022-09-07 DIAGNOSIS — R6889 Other general symptoms and signs: Secondary | ICD-10-CM | POA: Diagnosis not present

## 2022-09-07 DIAGNOSIS — Z1152 Encounter for screening for COVID-19: Secondary | ICD-10-CM

## 2022-09-07 DIAGNOSIS — I700 Atherosclerosis of aorta: Secondary | ICD-10-CM | POA: Diagnosis not present

## 2022-09-07 LAB — COMPREHENSIVE METABOLIC PANEL
ALT: 40 U/L (ref 0–44)
AST: 46 U/L — ABNORMAL HIGH (ref 15–41)
Albumin: 2.7 g/dL — ABNORMAL LOW (ref 3.5–5.0)
Alkaline Phosphatase: 203 U/L — ABNORMAL HIGH (ref 38–126)
Anion gap: 16 — ABNORMAL HIGH (ref 5–15)
BUN: 47 mg/dL — ABNORMAL HIGH (ref 8–23)
CO2: 24 mmol/L (ref 22–32)
Calcium: 8 mg/dL — ABNORMAL LOW (ref 8.9–10.3)
Chloride: 97 mmol/L — ABNORMAL LOW (ref 98–111)
Creatinine, Ser: 3.8 mg/dL — ABNORMAL HIGH (ref 0.44–1.00)
GFR, Estimated: 12 mL/min — ABNORMAL LOW (ref >60)
Glucose, Bld: 91 mg/dL (ref 70–99)
Potassium: 2.6 mmol/L — CL (ref 3.5–5.1)
Sodium: 137 mmol/L (ref 135–145)
Total Bilirubin: 0.9 mg/dL (ref 0.3–1.2)
Total Protein: 6.6 g/dL (ref 6.5–8.1)

## 2022-09-07 LAB — CBC
HCT: 27.8 % — ABNORMAL LOW (ref 36.0–46.0)
Hemoglobin: 9.2 g/dL — ABNORMAL LOW (ref 12.0–15.0)
MCH: 29.9 pg (ref 26.0–34.0)
MCHC: 33.1 g/dL (ref 30.0–36.0)
MCV: 90.3 fL (ref 80.0–100.0)
Platelets: 186 10*3/uL (ref 150–400)
RBC: 3.08 MIL/uL — ABNORMAL LOW (ref 3.87–5.11)
RDW: 15.4 % (ref 11.5–15.5)
WBC: 13.8 10*3/uL — ABNORMAL HIGH (ref 4.0–10.5)
nRBC: 0 % (ref 0.0–0.2)

## 2022-09-07 LAB — HEPATITIS B SURFACE ANTIGEN: Hepatitis B Surface Ag: NONREACTIVE

## 2022-09-07 LAB — MRSA NEXT GEN BY PCR, NASAL: MRSA by PCR Next Gen: NOT DETECTED

## 2022-09-07 LAB — RESP PANEL BY RT-PCR (RSV, FLU A&B, COVID)  RVPGX2
Influenza A by PCR: NEGATIVE
Influenza B by PCR: NEGATIVE
Resp Syncytial Virus by PCR: NEGATIVE
SARS Coronavirus 2 by RT PCR: NEGATIVE

## 2022-09-07 MED ORDER — PANTOPRAZOLE SODIUM 40 MG PO TBEC
40.0000 mg | DELAYED_RELEASE_TABLET | Freq: Two times a day (BID) | ORAL | Status: DC
  Administered 2022-09-07 – 2022-09-09 (×4): 40 mg via ORAL
  Filled 2022-09-07 (×4): qty 1

## 2022-09-07 MED ORDER — IPRATROPIUM-ALBUTEROL 0.5-2.5 (3) MG/3ML IN SOLN
3.0000 mL | Freq: Four times a day (QID) | RESPIRATORY_TRACT | Status: DC
  Administered 2022-09-07 – 2022-09-09 (×6): 3 mL via RESPIRATORY_TRACT
  Filled 2022-09-07 (×6): qty 3

## 2022-09-07 MED ORDER — METHYLPREDNISOLONE SODIUM SUCC 125 MG IJ SOLR
60.0000 mg | Freq: Two times a day (BID) | INTRAMUSCULAR | Status: DC
  Administered 2022-09-08: 60 mg via INTRAVENOUS
  Filled 2022-09-07: qty 2

## 2022-09-07 MED ORDER — PROCHLORPERAZINE EDISYLATE 10 MG/2ML IJ SOLN: 5.0000 mg | Freq: Four times a day (QID) | INTRAMUSCULAR | Status: DC | PRN

## 2022-09-07 MED ORDER — ACETAMINOPHEN 650 MG RE SUPP: 650.0000 mg | Freq: Four times a day (QID) | RECTAL | Status: DC | PRN

## 2022-09-07 MED ORDER — HEPARIN SODIUM (PORCINE) 5000 UNIT/ML IJ SOLN
5000.0000 [IU] | Freq: Three times a day (TID) | INTRAMUSCULAR | Status: DC
  Administered 2022-09-07 – 2022-09-09 (×5): 5000 [IU] via SUBCUTANEOUS
  Filled 2022-09-07 (×5): qty 1

## 2022-09-07 MED ORDER — POTASSIUM CHLORIDE 10 MEQ/100ML IV SOLN
10.0000 meq | INTRAVENOUS | Status: AC
  Filled 2022-09-07: qty 100

## 2022-09-07 MED ORDER — HYDRALAZINE HCL 25 MG PO TABS
50.0000 mg | ORAL_TABLET | Freq: Two times a day (BID) | ORAL | Status: DC
  Administered 2022-09-07 – 2022-09-08 (×3): 50 mg via ORAL
  Filled 2022-09-07 (×3): qty 2

## 2022-09-07 MED ORDER — VANCOMYCIN HCL IN DEXTROSE 1-5 GM/200ML-% IV SOLN
1000.0000 mg | Freq: Once | INTRAVENOUS | Status: AC
  Administered 2022-09-07: 1000 mg via INTRAVENOUS
  Filled 2022-09-07: qty 200

## 2022-09-07 MED ORDER — AMLODIPINE BESYLATE 5 MG PO TABS
10.0000 mg | ORAL_TABLET | Freq: Every day | ORAL | Status: DC
  Administered 2022-09-07 – 2022-09-08 (×2): 10 mg via ORAL
  Filled 2022-09-07 (×2): qty 2

## 2022-09-07 MED ORDER — ACETAMINOPHEN 325 MG PO TABS: 650.0000 mg | ORAL_TABLET | Freq: Four times a day (QID) | ORAL | Status: DC | PRN

## 2022-09-07 MED ORDER — METHYLPREDNISOLONE SODIUM SUCC 125 MG IJ SOLR
125.0000 mg | Freq: Once | INTRAMUSCULAR | Status: AC
  Administered 2022-09-07: 125 mg via INTRAVENOUS
  Filled 2022-09-07: qty 2

## 2022-09-07 MED ORDER — METOPROLOL SUCCINATE ER 25 MG PO TB24
25.0000 mg | ORAL_TABLET | Freq: Every day | ORAL | Status: DC
  Administered 2022-09-07 – 2022-09-08 (×2): 25 mg via ORAL
  Filled 2022-09-07 (×2): qty 1

## 2022-09-07 MED ORDER — PIPERACILLIN-TAZOBACTAM 3.375 G IVPB 30 MIN
3.3750 g | Freq: Once | INTRAVENOUS | Status: AC
  Administered 2022-09-07: 3.375 g via INTRAVENOUS
  Filled 2022-09-07: qty 50

## 2022-09-07 MED ORDER — BUDESONIDE 0.5 MG/2ML IN SUSP
0.5000 mg | Freq: Two times a day (BID) | RESPIRATORY_TRACT | Status: AC
  Administered 2022-09-07 – 2022-09-08 (×3): 0.5 mg via RESPIRATORY_TRACT
  Filled 2022-09-07 (×3): qty 2

## 2022-09-07 MED ORDER — HEPARIN SODIUM (PORCINE) 1000 UNIT/ML DIALYSIS
1000.0000 [IU] | INTRAMUSCULAR | Status: DC | PRN
  Administered 2022-09-07 – 2022-09-09 (×2): 3200 [IU]

## 2022-09-07 MED ORDER — ALTEPLASE 2 MG IJ SOLR: 2.0000 mg | Freq: Once | INTRAMUSCULAR | Status: DC | PRN

## 2022-09-07 MED ORDER — SERTRALINE HCL 50 MG PO TABS
50.0000 mg | ORAL_TABLET | Freq: Every day | ORAL | Status: DC
  Administered 2022-09-08 – 2022-09-09 (×2): 50 mg via ORAL
  Filled 2022-09-07 (×2): qty 1

## 2022-09-07 MED ORDER — SODIUM CHLORIDE 0.9 % IV SOLN: 2.0000 g | INTRAVENOUS | Status: DC

## 2022-09-07 MED ORDER — VANCOMYCIN HCL 500 MG/100ML IV SOLN
500.0000 mg | INTRAVENOUS | Status: DC
  Administered 2022-09-07: 500 mg via INTRAVENOUS
  Filled 2022-09-07: qty 100

## 2022-09-07 MED ORDER — ARFORMOTEROL TARTRATE 15 MCG/2ML IN NEBU
15.0000 ug | INHALATION_SOLUTION | Freq: Two times a day (BID) | RESPIRATORY_TRACT | Status: AC
  Administered 2022-09-07 – 2022-09-08 (×3): 15 ug via RESPIRATORY_TRACT
  Filled 2022-09-07 (×3): qty 2

## 2022-09-07 MED ORDER — ATORVASTATIN CALCIUM 40 MG PO TABS
40.0000 mg | ORAL_TABLET | Freq: Every day | ORAL | Status: DC
  Administered 2022-09-07 – 2022-09-08 (×2): 40 mg via ORAL
  Filled 2022-09-07 (×2): qty 1

## 2022-09-07 MED ORDER — HEPARIN SODIUM (PORCINE) 1000 UNIT/ML IJ SOLN
INTRAMUSCULAR | Status: AC
  Filled 2022-09-07: qty 5

## 2022-09-07 NOTE — Hospital Course (Signed)
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  The patient endorses compliance with her dialysis.  She was last dialyzed on 09/04/2022 and stayed the whole duration.  She denies any dietary or fluid indiscretion.  She denies any fevers, chills, chest pain, hemoptysis, nausea, vomiting, diarrhea, abdominal pain.  She has had a nonproductive cough. She denies any headache or neck pain.  She denies any hematochezia or melena. Notably, the patient was admitted to the hospital from 05/26/2022  till 06/05/22 secondary to acute on chronic renal failure.  She was initiated on dialysis at that time.  Her hospitalization was prolonged secondary to GI bleed as well as perforated duodenum requiring exploratory laparotomy. In the ED, the patient was afebrile and hemodynamically stable.  Oxygen saturation was 86% on room air.  She was placed on 2 L with saturation 95-97%.  The patient was started on vancomycin and Zosyn in the emergency department.  WBC 13.8, hemoglobin 9.2, platelets 186,000.  Sodium 137, potassium 2.6, bicarbonate 24, serum creatinine 3.80.  EKG shows sinus rhythm with no ST-T wave changes.  CT of the chest showed bilateral pleural effusions, right greater than left.  There was perihilar GGO with more confluent patchy opacities in the upper lung zones.

## 2022-09-07 NOTE — Progress Notes (Signed)
Pharmacy Antibiotic Note  MARILIA HARDESTER is a 72 y.o. female admitted on 09/07/2022 with pneumonia.  Pharmacy has been consulted for vancomycin and cefepime dosing.  Plan: Vancomycin 1000 mg IV x 1 dose. Vancomycin 500 mg IV every T-Th-Sat after HD Zosyn 3.375g IV x 1 dose. Continue with cefepime 2000 mg IV every T-Th-Sat after HD Monitor labs, c/s, and vanco levels as indicated.  Height: 4\' 10"  (147.3 cm) Weight: 49.9 kg (110 lb) IBW/kg (Calculated) : 40.9  No data recorded.  Recent Labs  Lab 09/07/22 0900  WBC 13.8*  CREATININE 3.80*    Estimated Creatinine Clearance: 9.4 mL/min (A) (by C-G formula based on SCr of 3.8 mg/dL (H)).    No Known Allergies  Antimicrobials this admission: Vanco 5/7 >> Cefepime 5/7 >> Zosyn 5/7  Microbiology results: 5/7 MRSA PCR: pending  Thank you for allowing pharmacy to be a part of this patient's care.  Judeth Cornfield, PharmD Clinical Pharmacist 09/07/2022 1:45 PM

## 2022-09-07 NOTE — ED Provider Notes (Signed)
[place] EMERGENCY DEPARTMENT AT [facility] Provider Note   CSN: [id number] Arrival date & time: 09/07/22  4098     History  No chief complaint on file.   Sheri Cooper is a 72 y.o. female.  HPI Patient presents via EMS from home with dyspnea. Patient has multiple medical issues including end-stage renal disease, has dialysis Saturday, Tuesday, Thursday.  She had dialysis successfully, completely on Saturday but following day developed dyspnea, and today was too dyspneic, weak to wait for her dialysis which is scheduled for later today. No focal pain, no fever, no vomiting.  EMS reports the patient was hypoxic, tachypneic, and hypertensive en route.    Home Medications Prior to Admission medications   Medication Sig Start Date End Date Taking? Authorizing Provider  amLODipine (NORVASC) 10 MG tablet Take 10 mg by mouth daily.    [provider]  atorvastatin (LIPITOR) 40 MG tablet Take 40 mg by mouth daily. 07/14/22   [provider]  furosemide (LASIX) 40 MG tablet Take 2 tablets (80 mg total) by mouth daily. Patient taking differently: Take 40 mg by mouth 2 (two) times daily. 06/05/22 06/05/23  Vassie Loll, MD  hydrALAZINE (APRESOLINE) 100 MG tablet Take 0.5 tablets (50 mg total) by mouth 2 (two) times daily. 06/05/22   Vassie Loll, MD  hydrALAZINE (APRESOLINE) 50 MG tablet Take 50 mg by mouth 2 (two) times daily. Patient not taking: Reported on 08/17/2022 07/14/22   [provider]  hydrochlorothiazide (HYDRODIURIL) 25 MG tablet Take 25 mg by mouth daily.    [provider]  metoprolol succinate (TOPROL-XL) 50 MG 24 hr tablet Take 0.5 tablets (25 mg total) by mouth daily. 06/05/22   Vassie Loll, MD  nystatin (MYCOSTATIN/NYSTOP) powder Apply topically 2 (two) times daily. Apply to affected area (under breast skin and skin folds) Patient not taking: Reported on 08/17/2022 06/05/22   Vassie Loll, MD  oxyCODONE (ROXICODONE) 5 MG  immediate release tablet Take 1-2 tablets (5-10 mg total) by mouth every 8 (eight) hours as needed for severe pain. 06/05/22 06/05/23  Vassie Loll, MD  pantoprazole (PROTONIX) 40 MG tablet Take 1 tablet (40 mg total) by mouth 2 (two) times daily. 06/05/22 06/05/23  Vassie Loll, MD  predniSONE (DELTASONE) 20 MG tablet Take 3 tablets (60 mg total) by mouth daily. Patient taking differently: Take 40 mg by mouth daily. 06/05/22   Vassie Loll, MD  rosuvastatin (CRESTOR) 20 MG tablet Take 20 mg by mouth daily.    [provider]  sertraline (ZOLOFT) 50 MG tablet Take 1 tablet by mouth daily. 05/12/22 08/17/22  [provider]      Allergies    Patient has no known allergies.    Review of Systems   Review of Systems  All other systems reviewed and are negative.   Physical Exam Updated Vital Signs BP (!) 176/77   Pulse 88   Resp (!) 21   Ht 4\' 10"  (1.473 m)   Wt 49.9 kg   SpO2 98%   BMI 22.99 kg/m  Physical Exam Vitals and nursing note reviewed.  Constitutional:      General: She is not in acute distress.    Appearance: She is well-developed. She is ill-appearing.  HENT:     Head: Normocephalic and atraumatic.  Eyes:     Conjunctiva/sclera: Conjunctivae normal.  Cardiovascular:     Rate and Rhythm: Regular rhythm. Tachycardia present.  Pulmonary:     Effort: Tachypnea and accessory muscle usage  present. No respiratory distress.     Breath sounds: Decreased air movement present. No stridor.  Chest:    Abdominal:     General: There is no distension.  Skin:    General: Skin is warm and dry.  Neurological:     Mental Status: She is alert and oriented to person, place, and time.     Cranial Nerves: No cranial nerve deficit.  Psychiatric:        Mood and Affect: Mood normal.     ED Results / Procedures / Treatments   Labs (all labs ordered are listed, but only abnormal results are displayed) Labs Reviewed  COMPREHENSIVE METABOLIC PANEL - Abnormal; Notable  for the following components:      Result Value   Potassium 2.6 (*)    Chloride 97 (*)    BUN 47 (*)    Creatinine, Ser 3.80 (*)    Calcium 8.0 (*)    Albumin 2.7 (*)    AST 46 (*)    Alkaline Phosphatase 203 (*)    GFR, Estimated 12 (*)    Anion gap 16 (*)    All other components within normal limits  CBC - Abnormal; Notable for the following components:   WBC 13.8 (*)    RBC 3.08 (*)    Hemoglobin 9.2 (*)    HCT 27.8 (*)    All other components within normal limits  RESP PANEL BY RT-PCR (RSV, FLU A&B, COVID)  RVPGX2  SARS CORONAVIRUS 2 BY RT PCR  HEPATITIS B SURFACE ANTIGEN  HEPATITIS B SURFACE ANTIBODY, QUANTITATIVE    EKG EKG Interpretation  Date/Time:  Tuesday Sep 07 2022 08:44:25 EDT Ventricular Rate:  89 PR Interval:  100 QRS Duration: 76 QT Interval:  415 QTC Calculation: 505 R Axis:   44 Text Interpretation: Sinus rhythm Ventricular premature complex Short PR interval Borderline repol abnormality, diffuse leads Prolonged QT interval Confirmed by Gerhard Munch [phone]) on 09/07/2022 9:47:46 AM  Radiology CT Chest Wo Contrast  Result Date: 09/07/2022 CLINICAL DATA:  Abnormal chest x-ray EXAM: CT CHEST WITHOUT CONTRAST TECHNIQUE: Multidetector CT imaging of the chest was performed following the standard protocol without IV contrast. RADIATION DOSE REDUCTION: This exam was performed according to the departmental dose-optimization program which includes automated exposure control, adjustment of the mA and/or kV according to patient size and/or use of iterative reconstruction technique. COMPARISON:  Chest x-ray 09/07/2022 and older. Noncontrast CT scan chest 05/16/2022 FINDINGS: Cardiovascular: On this non IV contrast exam, the heart is slightly enlarged. Trace pericardial fluid. There is right IJ large-bore catheter with tip extending to the right atrium. The thoracic aorta has a normal course and caliber with scattered vascular calcifications. There is bovine type aortic  arch, a normal variant. Mediastinum/Nodes: Normal caliber thoracic esophagus. The thyroid gland is grossly preserved. On this non IV contrast exam there is no specific abnormal lymph node enlargement seen in the axillary region, hilum or mediastinum. Lungs/Pleura: Small left and moderate right pleural effusion. Adjacent parenchymal opacities. In both lungs there are scattered areas of ground-glass parenchymal patchy opacities. There are some low-grade perihilar changes. However patchy more confluent in the upper lung zones and likely corresponding to the findings by x-ray. This has more infectious or inflammatory in appearance. Differential would include atypical infectious including a viral process. No pneumothorax. Diffuse breathing motion. Upper Abdomen: Adrenal glands are preserved in the upper abdomen. Musculoskeletal: Osteopenia. Mild degenerative changes. Surgical changes along the left breast with soft tissue thickening is stable. IMPRESSION:  Bilateral pleural effusions, moderate right and small left. Adjacent parenchymal opacities. Perihilar hazy low-grade ground-glass changes are seen in the lungs. Possibilities include edema. However there is also more confluence patchy areas of opacity in the upper lung zones which may correspond to the finding by x-ray and could be sequela of and separate infectious or inflammatory process including atypical process. Recommend short follow-up. Aortic Atherosclerosis (ICD10-I70.0). Electronically Signed   By: Karen Kays M.D.   On: 09/07/2022 10:15   DG Chest Port 1 View  Result Date: 09/07/2022 CLINICAL DATA:  Shortness of breath. EXAM: PORTABLE CHEST 1 VIEW COMPARISON:  05/28/2022 FINDINGS: The cardio pericardial silhouette is enlarged. Diffuse interstitial opacity is associated with patchy basilar airspace disease, right greater than left. Probable tiny bilateral pleural effusions. Right IJ central line tip overlies the SVC/RA junction. Right suprahilar lung  lesion is new in the interval, potentially infectious/inflammatory. Telemetry leads overlie the chest. IMPRESSION: 1. Diffuse interstitial opacity with patchy basilar airspace disease, right greater than left. 2. New right suprahilar lung lesion, potentially infectious/inflammatory. CT chest without contrast recommended to further evaluate. Electronically Signed   By: Kennith Center M.D.   On: 09/07/2022 09:03    Procedures Procedures    Medications Ordered in ED Medications  potassium chloride 10 mEq in 100 mL IVPB (has no administration in time range)  vancomycin (VANCOCIN) IVPB 1000 mg/200 mL premix (has no administration in time range)  piperacillin-tazobactam (ZOSYN) IVPB 3.375 g (has no administration in time range)    ED Course/ Medical Decision Making/ A&P This patient with a Hx of end-stage renal disease, hypertension presents to the ED for concern of dyspnea, fatigue, this involves an extensive number of treatment options, and is a complaint that carries with it a high risk of complications and morbidity.    The differential diagnosis includes pneumonia, fluid overload status, pneumothorax, less likely ACS   Social Determinants of Health:  End-stage, dialysis  Additional history obtained:  Additional history and/or information obtained from MS, notable for as above   After the initial evaluation, orders, including: Labs x-ray were initiated.   Patient placed on Cardiac and Pulse-Oximetry Monitors. The patient was maintained on a cardiac monitor.  The cardiac monitored showed an rhythm of 90 sinus tach abnormal The patient was also maintained on pulse oximetry. The readings were typically 82% on room air, abnormal, this improved with supplemental oxygen   On repeat evaluation of the patient stayed the same With 3 L via nasal cannula patient has saturation that is improved. Lab Tests:  I personally interpreted labs.  The pertinent results include: Hypokalemia requiring  IV repletion, renal function consistent with known end-stage renal disease, COVID-negative.  Leukocytosis 14,000  Imaging Studies ordered:  I independently visualized and interpreted imaging which showed x-ray, and subsequent CT both concerning for both pulmonary congestion as well as possible infection with opacification in the right upper lobe I agree with the radiologist interpretation  Consultations Obtained:  I requested consultation with the neurology and internal medicine,  and discussed lab and imaging findings as well as pertinent plan - they recommend: Nephrology will facilitate dialysis here.  Dispostion / Final MDM:  After consideration of the diagnostic results and the patient's response to treatment, this elderly female with multiple medical issues including hypertension, end-stage renal disease presents with dyspnea, new oxygen requirement, increased work of breathing and concern for both fluid overload status and pneumonia.  After supplemental oxygen, repletion of hypokalemia, though without notable changes on EKG, patient required  admission, with facilitation of dialysis here.  Final Clinical Impression(s) / ED Diagnoses Final diagnoses:  Respiratory distress  HCAP (healthcare-associated pneumonia)  Hypokalemia   CRITICAL CARE Performed by: Gerhard Munch Total critical care time: 35 minutes Critical care time was exclusive of separately billable procedures and treating other patients. Critical care was necessary to treat or prevent imminent or life-threatening deterioration. Critical care was time spent personally by me on the following activities: development of treatment plan with patient and/or surrogate as well as nursing, discussions with consultants, evaluation of patient's response to treatment, examination of patient, obtaining history from patient or surrogate, ordering and performing treatments and interventions, ordering and review of laboratory studies,  ordering and review of radiographic studies, pulse oximetry and re-evaluation of patient's condition.    Gerhard Munch, MD 09/07/22 1158

## 2022-09-07 NOTE — Discharge Instructions (Signed)
Transportation Agency Name: [facility] [place] [facility] Address: [address], [place], [place] [address] Phone: [phone] Website: www.adtsrc.org Services Offered: Meals on [employer]. Home care, at home assisted  living, volunteer services, [facility],  [facility] and [employer] transportation system.  Agency Name: [facility] Transportation Address: [address] [place], [place] [address] Phone: [phone] Website: [email or web address] Services Offered: Transportation for a fee.       IMPORTANT INFORMATION: PAY CLOSE ATTENTION   PHYSICIAN DISCHARGE INSTRUCTIONS  Follow with Primary care provider  Sheri Chimera, MD  and other consultants as instructed by your Hospitalist Physician  SEEK MEDICAL CARE OR RETURN TO EMERGENCY ROOM IF SYMPTOMS COME BACK, WORSEN OR NEW PROBLEM DEVELOPS   Please note: You were cared for by a hospitalist during your hospital stay. Every effort will be made to forward records to your primary care provider.  You can request that your primary care provider send for your hospital records if they have not received them.  Once you are discharged, your primary care physician will handle any further medical issues. Please note that NO REFILLS for any discharge medications will be authorized once you are discharged, as it is imperative that you return to your primary care physician (or establish a relationship with a primary care physician if you do not have one) for your post hospital discharge needs so that they can reassess your need for medications and monitor your lab values.  Please get a complete blood count and chemistry panel checked by your Primary MD at your next visit, and again as instructed by your Primary MD.  Get Medicines reviewed and adjusted: Please take all your medications with you for your next visit with your Primary MD  Laboratory/radiological data: Please request your  Primary MD to go over all hospital tests and procedure/radiological results at the follow up, please ask your primary care provider to get all Hospital records sent to his/her office.  In some cases, they will be blood work, cultures and biopsy results pending at the time of your discharge. Please request that your primary care provider follow up on these results.  If you are diabetic, please bring your blood sugar readings with you to your follow up appointment with primary care.    Please call and make your follow up appointments as soon as possible.    Also Note the following: If you experience worsening of your admission symptoms, develop shortness of breath, life threatening emergency, suicidal or homicidal thoughts you must seek medical attention immediately by calling 911 or calling your MD immediately  if symptoms less severe.  You must read complete instructions/literature along with all the possible adverse reactions/side effects for all the Medicines you take and that have been prescribed to you. Take any new Medicines after you have completely understood and accpet all the possible adverse reactions/side effects.   Do not drive when taking Pain medications or sleeping medications (Benzodiazepines)  Do not take more than prescribed Pain, Sleep and Anxiety Medications. It is not advisable to combine anxiety,sleep and pain medications without talking with your primary care practitioner  Special Instructions: If you have smoked or chewed Tobacco  in the last 2 yrs please stop smoking, stop any regular Alcohol  and or any Recreational drug use.  Wear Seat belts while driving.  Do not drive if taking any narcotic, mind altering or controlled substances or recreational drugs or alcohol.

## 2022-09-07 NOTE — ED Triage Notes (Signed)
Pt arrived via [facility] from home c/o SOB since Sunday. 86% RA  Dialysis pt with a Hx of HTN, MD at bedside. Denes N/V, denies pain

## 2022-09-07 NOTE — Progress Notes (Signed)
   09/07/22 1435  Assess: MEWS Score  Temp 98.2 F (36.8 C)  BP (!) 177/58  MAP (mmHg) 94  Pulse Rate 84  Resp (!) 26  SpO2 98 %  O2 Device Nasal Cannula  O2 Flow Rate (L/min) 2 L/min  Assess: MEWS Score  MEWS Temp 0  MEWS Systolic 0  MEWS Pulse 0  MEWS RR 2  MEWS LOC 0  MEWS Score 2  MEWS Score Color Yellow  Assess: if the MEWS score is Yellow or Red  Were vital signs taken at a resting state? Yes  Focused Assessment Change from prior assessment (see assessment flowsheet)  Does the patient meet 2 or more of the SIRS criteria? Yes  MEWS guidelines implemented  Yes, yellow  Treat  MEWS Interventions Considered administering scheduled or prn medications/treatments as ordered  Take Vital Signs  Increase Vital Sign Frequency  Yellow: Q2hr x1, continue Q4hrs until patient remains green for 12hrs  Escalate  MEWS: Escalate Yellow: Discuss with charge nurse and consider notifying provider and/or RRT  Notify: Charge Nurse/RN  Name of Charge Nurse/RN Notified [place] RN  Provider Notification  Provider Name/Title Tat, Onalee Hua, MD  Date Provider Notified 09/07/22  Time Provider Notified 1440  Method of Notification Page (Secure chat)  Notification Reason Other (Comment) (yellow MEWs)  Provider response No new orders  Date of Provider Response 09/07/22  Time of Provider Response 1445  Assess: SIRS CRITERIA  SIRS Temperature  0  SIRS Pulse 0  SIRS Respirations  1  SIRS WBC 1  SIRS Score Sum  2

## 2022-09-07 NOTE — TOC Progression Note (Addendum)
  Transition of Care [facility]) Screening Note   Patient Details  Name: Sheri Cooper Date of Birth: Dec 11, 1950   Transition of Care [facility]) CM/SW Contact:    Leitha Bleak, RN Phone Number: 09/07/2022, 3:21 PM   SDOH - Transportation added to AVS. Patient is active with [facility] for PT/OT.  Transition of Care Department [facility]) has reviewed patient and no TOC needs have been identified at this time. We will continue to monitor patient advancement through interdisciplinary progression rounds. If new patient transition needs arise, please place a TOC consult.      Barriers to Discharge: Continued Medical Work up  Expected Discharge Plan and Services      Social Determinants of Health (SDOH) Interventions SDOH Screenings   Food Insecurity: No Food Insecurity (08/02/2022)  Housing: Low Risk  (08/02/2022)  Transportation Needs: Unmet Transportation Needs (08/06/2022)  Utilities: Not At Risk (08/02/2022)  Alcohol Screen: Low Risk  (08/02/2022)  Depression (PHQ2-9): Low Risk  (08/02/2022)  Financial Resource Strain: Low Risk  (08/02/2022)  Physical Activity: Inactive (08/02/2022)  Social Connections: Unknown (08/02/2022)  Stress: No Stress Concern Present (08/02/2022)  Tobacco Use: Medium Risk (08/30/2022)    Readmission Risk Interventions    05/18/2022   11:51 AM  Readmission Risk Prevention Plan  Transportation Screening Complete  PCP or Specialist Appt within 5-7 Days Not Complete  Home Care Screening Complete  Medication Review (RN CM) Complete

## 2022-09-07 NOTE — Progress Notes (Signed)
Brief Nephrology Progress Note  62F ESRD THS R IJ TDC (?Da[place]ta [place], not sure right now) presented to [facility] ED this AM with dyspnea and has AHRF req supplemental O2.  pCXR and CT Chest (both independently reviewed) show b/l pleural effusons and ? Edema vs infection.  BPs generous. SpO2 stable on 2L [place]. Labs reviewed K 2.6, BUN 47.  WBC 13.8 and Hb 9.2.    Plan for HD today on schedule. 4K, TDC, 3.5h, 3-4L UF, no heparin.   Will be available.  Plan to see formally tomorrow.

## 2022-09-07 NOTE — H&P (Signed)
History and Physical    Patient: Sheri Cooper JYN:[id number] DOB: August 18, 1950 DOA: 09/07/2022 DOS: the patient was seen and examined on 09/07/2022 PCP: Richardean Chimera, MD  Patient coming from: Home  Chief Complaint: No chief complaint on file.  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  The patient endorses compliance with her dialysis.  She was last dialyzed on 09/04/2022 and stayed the whole duration.  She denies any dietary or fluid indiscretion.  She denies any fevers, chills, chest pain, hemoptysis, nausea, vomiting, diarrhea, abdominal pain.  She has had a nonproductive cough. She denies any headache or neck pain.  She denies any hematochezia or melena. Notably, the patient was admitted to the hospital from 05/26/2022  till 06/05/22 secondary to acute on chronic renal failure.  She was initiated on dialysis at that time.  Her hospitalization was prolonged secondary to GI bleed as well as perforated duodenum requiring exploratory laparotomy. In the ED, the patient was afebrile and hemodynamically stable.  Oxygen saturation was 86% on room air.  She was placed on 2 L with saturation 95-97%.  The patient was started on vancomycin and Zosyn in the emergency department.  WBC 13.8, hemoglobin 9.2, platelets 186,000.  Sodium 137, potassium 2.6, bicarbonate 24, serum creatinine 3.80.  EKG shows sinus rhythm with no ST-T wave changes.  CT of the chest showed bilateral pleural effusions, right greater than left.  There was perihilar GGO with more confluent patchy opacities in the upper lung zones.  Review of Systems: As mentioned in the history of present illness. All other systems reviewed and are negative. Past Medical History:  Diagnosis Date   Chronic kidney disease (CKD), stage IV (severe) (HCC)    COPD (chronic obstructive pulmonary  disease) (HCC)    PUD (peptic ulcer disease) 05/2022   Rheumatoid arthritis (HCC)    Past Surgical History:  Procedure Laterality Date   CENTRAL VENOUS CATHETER INSERTION Left 05/27/2022   Procedure: INSERTION CENTRAL LINE ADULT;  Surgeon: Lucretia Roers, MD;  Location: [facility];  Service: General;  Laterality: Left;   COLONOSCOPY WITH PROPOFOL N/A 05/22/2022   Surgeon: Marguerita Merles, Reuel Boom, MD; blood in the terminal ileum and colon.   ESOPHAGOGASTRODUODENOSCOPY (EGD) WITH PROPOFOL N/A 05/22/2022   Surgeon: Marguerita Merles, Reuel Boom, MD; peptic ulcer disease with 2 nonbleeding cratered gastric ulcers, multiple duodenal ulcers, 2 of which had adherent clots that were removed and revealed 2 vessels.  1 vessel was clipped, cauterized, and treated with epinephrine injection.  Second vessel was clipped and treated with cautery, clip fell off.   IR FLUORO GUIDE CV LINE RIGHT  05/24/2022   IR US GUIDE VASC ACCESS RIGHT  05/24/2022   LAPAROTOMY N/A 05/27/2022   Procedure: EXPLORATORY LAPAROTOMY, OMENTAL PATH, DRAIN PLACEMENT;  Surgeon: Lucretia Roers, MD;  Location: [facility];  Service: General;  Laterality: N/A;   VOLVULUS REDUCTION     colon resection   Social History:  reports that she has quit smoking. Her smoking use included cigarettes. She has been exposed to tobacco smoke. She has never used smokeless tobacco. She reports that she does not currently use alcohol. She reports that she does not use drugs.  No Known Allergies  No family history on file.  Prior to Admission medications   Medication Sig Start Date End Date Taking? Authorizing Provider  amLODipine (NORVASC) 10 MG  tablet Take 10 mg by mouth daily.    [provider]  atorvastatin (LIPITOR) 40 MG tablet Take 40 mg by mouth daily. 07/14/22   [provider]  furosemide (LASIX) 40 MG tablet Take 2 tablets (80 mg total) by mouth daily. Patient taking differently: Take 40 mg by mouth 2 (two) times daily.  06/05/22 06/05/23  Vassie Loll, MD  hydrALAZINE (APRESOLINE) 100 MG tablet Take 0.5 tablets (50 mg total) by mouth 2 (two) times daily. 06/05/22   Vassie Loll, MD  hydrALAZINE (APRESOLINE) 50 MG tablet Take 50 mg by mouth 2 (two) times daily. Patient not taking: Reported on 08/17/2022 07/14/22   [provider]  hydrochlorothiazide (HYDRODIURIL) 25 MG tablet Take 25 mg by mouth daily.    [provider]  metoprolol succinate (TOPROL-XL) 50 MG 24 hr tablet Take 0.5 tablets (25 mg total) by mouth daily. 06/05/22   Vassie Loll, MD  nystatin (MYCOSTATIN/NYSTOP) powder Apply topically 2 (two) times daily. Apply to affected area (under breast skin and skin folds) Patient not taking: Reported on 08/17/2022 06/05/22   Vassie Loll, MD  oxyCODONE (ROXICODONE) 5 MG immediate release tablet Take 1-2 tablets (5-10 mg total) by mouth every 8 (eight) hours as needed for severe pain. 06/05/22 06/05/23  Vassie Loll, MD  pantoprazole (PROTONIX) 40 MG tablet Take 1 tablet (40 mg total) by mouth 2 (two) times daily. 06/05/22 06/05/23  Vassie Loll, MD  predniSONE (DELTASONE) 20 MG tablet Take 3 tablets (60 mg total) by mouth daily. Patient taking differently: Take 40 mg by mouth daily. 06/05/22   Vassie Loll, MD  rosuvastatin (CRESTOR) 20 MG tablet Take 20 mg by mouth daily.    [provider]  sertraline (ZOLOFT) 50 MG tablet Take 1 tablet by mouth daily. 05/12/22 08/17/22  [provider]    Physical Exam: Vitals:   09/07/22 0900 09/07/22 1130  BP: (!) 181/78 (!) 176/77  Pulse: 93 88  Resp: (!) 26 (!) 21  SpO2: 96% 98%  Weight: 49.9 kg   Height: 4\' 10"  (1.473 m)   GENERAL:  A&O x 3, NAD, well developed, cooperative, follows commands HEENT: [place]/AT, No thrush, No icterus, No oral ulcers Neck:  No neck mass, No meningismus, soft, supple CV: RRR, no S3, no S4, no rub, no JVD Lungs: Bibasal rales.  Bibasilar expiratory wheeze.  Diminished breath sounds. Abd: soft/NT +BS,  nondistended Ext: No edema, no lymphangitis, no cyanosis, no rashes Neuro:  CN II-XII intact, strength 4/5 in RUE, RLE, strength 4/5 LUE, LLE; sensation intact bilateral; no dysmetria; babinski equivocal  Data Reviewed: Labs reviewed above in the history  Assessment and Plan: Acute respiratory failure with hypoxia -Secondary to fluid overload and pneumonia and COPD exacerbation -Presented with tachypnea and hypoxia with saturation 86% on room air -Currently stable on 2 L nasal cannula -Wean oxygen back to room air as tolerated for saturation greater than 92%  Lobar pneumonia -09/07/2022 CT chest as discussed above -MRSA screen -Continue empiric vancomycin and cefepime pending culture data -Add azithromycin  COPD exacerbation -Start IV Solu-Medrol -Start bronchodilators -Start Pulmicort  Fluid overload with pleural effusions -Nephrology plans for dialysis 09/07/2022 -Echocardiogram  ESRD -Nephrology consulted for maintenance dialysis -Patient dialyzes on Tuesday, Thursday, Saturday -Last dialysis 09/04/2022  FSGS -The patient was taking prednisone 3 tablets daily at home -She is unclear to exact dosing -Currently on IV Solu-Medrol for COPD exacerbation  Hypokalemia -Replete  Essential hypertension -Restart amlodipine and hydralazine    Advance Care Planning: DNR--confirmed  with patient  Consults: renal  Family Communication: none  Severity of Illness: The appropriate patient status for this patient is INPATIENT. Inpatient status is judged to be reasonable and necessary in order to provide the required intensity of service to ensure the patient's safety. The patient's presenting symptoms, physical exam findings, and initial radiographic and laboratory data in the context of their chronic comorbidities is felt to place them at high risk for further clinical deterioration. Furthermore, it is not anticipated that the patient will be medically stable for discharge from the  hospital within 2 midnights of admission.   * I certify that at the point of admission it is my clinical judgment that the patient will require inpatient hospital care spanning beyond 2 midnights from the point of admission due to high intensity of service, high risk for further deterioration and high frequency of surveillance required.*  Author: Catarina Hartshorn, MD 09/07/2022 12:30 PM  For on call review [email or web address].

## 2022-09-07 NOTE — Progress Notes (Signed)
  HEMODIALYSIS TREATMENT NOTE:   UF was limited by hypotension.  Had one episode of symptomatic hypotension 86/49 relieved with interruption of UF and NS bolus.  Net UF 1.9 liters.  All blood was returned.  Post-dialysis:  09/07/22 1955  Vitals  Temp 98.1 F (36.7 C)  Temp Source Oral  BP (!) 151/61  MAP (mmHg) 86  BP Location Right Arm  BP Method Automatic  Patient Position (if appropriate) Sitting  Pulse Rate 99  Pulse Rate Source Monitor  Resp 18  Oxygen Therapy  SpO2 100 %  O2 Device Nasal Cannula  O2 Flow Rate (L/min) 2 L/min    Arman Filter, RN [facility]

## 2022-09-08 ENCOUNTER — Inpatient Hospital Stay (HOSPITAL_COMMUNITY): Payer: Medicare Other

## 2022-09-08 DIAGNOSIS — R0609 Other forms of dyspnea: Secondary | ICD-10-CM

## 2022-09-08 LAB — CBC
HCT: 25.1 % — ABNORMAL LOW (ref 36.0–46.0)
Hemoglobin: 8.1 g/dL — ABNORMAL LOW (ref 12.0–15.0)
MCH: 28.8 pg (ref 26.0–34.0)
MCHC: 32.3 g/dL (ref 30.0–36.0)
MCV: 89.3 fL (ref 80.0–100.0)
Platelets: 210 10*3/uL (ref 150–400)
RBC: 2.81 MIL/uL — ABNORMAL LOW (ref 3.87–5.11)
RDW: 15.4 % (ref 11.5–15.5)
WBC: 9.4 10*3/uL (ref 4.0–10.5)
nRBC: 0 % (ref 0.0–0.2)

## 2022-09-08 LAB — ECHOCARDIOGRAM COMPLETE
Area-P 1/2: 4.29 cm2
Height: 59 in
S' Lateral: 3 cm
Weight: 1439.16 oz

## 2022-09-08 LAB — COMPREHENSIVE METABOLIC PANEL
ALT: 36 U/L (ref 0–44)
AST: 30 U/L (ref 15–41)
Albumin: 2.4 g/dL — ABNORMAL LOW (ref 3.5–5.0)
Alkaline Phosphatase: 193 U/L — ABNORMAL HIGH (ref 38–126)
Anion gap: 13 (ref 5–15)
BUN: 20 mg/dL (ref 8–23)
CO2: 26 mmol/L (ref 22–32)
Calcium: 8 mg/dL — ABNORMAL LOW (ref 8.9–10.3)
Chloride: 96 mmol/L — ABNORMAL LOW (ref 98–111)
Creatinine, Ser: 1.82 mg/dL — ABNORMAL HIGH (ref 0.44–1.00)
GFR, Estimated: 29 mL/min — ABNORMAL LOW (ref >60)
Glucose, Bld: 91 mg/dL (ref 70–99)
Potassium: 3.7 mmol/L (ref 3.5–5.1)
Sodium: 135 mmol/L (ref 135–145)
Total Bilirubin: 0.9 mg/dL (ref 0.3–1.2)
Total Protein: 6.2 g/dL — ABNORMAL LOW (ref 6.5–8.1)

## 2022-09-08 LAB — HEPATITIS B SURFACE ANTIBODY, QUANTITATIVE: Hep B S AB Quant (Post): <3.5 m[IU]/mL — ABNORMAL LOW (ref >9.9)

## 2022-09-08 MED ORDER — CHLORHEXIDINE GLUCONATE CLOTH 2 % EX PADS
6.0000 | MEDICATED_PAD | Freq: Every day | CUTANEOUS | Status: DC
  Administered 2022-09-08 – 2022-09-09 (×2): 6 via TOPICAL

## 2022-09-08 MED ORDER — METHYLPREDNISOLONE SODIUM SUCC 40 MG IJ SOLR
40.0000 mg | Freq: Two times a day (BID) | INTRAMUSCULAR | Status: DC
  Administered 2022-09-08 – 2022-09-09 (×2): 40 mg via INTRAVENOUS
  Filled 2022-09-08 (×2): qty 1

## 2022-09-08 MED ORDER — SODIUM CHLORIDE 0.9 % IV SOLN
100.0000 mg | INTRAVENOUS | Status: DC
  Administered 2022-09-08: 100 mg via INTRAVENOUS
  Filled 2022-09-08: qty 5

## 2022-09-08 NOTE — Progress Notes (Signed)
  Echocardiogram 2D Echocardiogram has been performed.  Sheri Cooper 09/08/2022, 11:57 AM

## 2022-09-08 NOTE — Progress Notes (Signed)
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  The patient endorses compliance with her dialysis.  She was last dialyzed on 09/04/2022 and stayed the whole duration.  She denies any dietary or fluid indiscretion.  She denies any fevers, chills, chest pain, hemoptysis, nausea, vomiting, diarrhea, abdominal pain.  She has had a nonproductive cough. She denies any headache or neck pain.  She denies any hematochezia or melena. Notably, the patient was admitted to the hospital from 05/26/2022  till 06/05/22 secondary to acute on chronic renal failure.  She was initiated on dialysis at that time.  Her hospitalization was prolonged secondary to GI bleed as well as perforated duodenum requiring exploratory laparotomy. In the ED, the patient was afebrile and hemodynamically stable.  Oxygen saturation was 86% on room air.  She was placed on 2 L with saturation 95-97%.  The patient was started on vancomycin and Zosyn in the emergency department.  WBC 13.8, hemoglobin 9.2, platelets 186,000.  Sodium 137, potassium 2.6, bicarbonate 24, serum creatinine 3.80.  EKG shows sinus rhythm with no ST-T wave changes.  CT of the chest showed bilateral pleural effusions, right greater than left.  There was perihilar GGO with more confluent patchy opacities in the upper lung zones.   Assessment and Plan:  Acute respiratory failure with hypoxia -Secondary to fluid overload and pneumonia and COPD exacerbation -Presented with tachypnea and hypoxia with saturation 86% on room air -Currently stable on 2 L nasal cannula -Wean oxygen back to room air as tolerated for saturation greater than 92%   Lobar pneumonia -09/07/2022 CT chest as discussed  above -MRSA screen -Continue cefepime pending culture data -DC vanc with neg MRSA screen   COPD exacerbation - IV Solu-Medrol - reduce dose 5/8  -continue Pulmicort   Fluid overload with pleural effusions -Nephrology plans for dialysis 09/07/2022 and 09/09/22  -Echocardiogram   ESRD -Nephrology consulted for maintenance dialysis -Patient dialyzes on Tuesday, Thursday, Saturday -Last dialysis 09/04/2022   FSGS -The patient was taking prednisone 3 tablets daily at home -per nephrology can start weaning off this on outpatient basis -She is unclear to exact dosing -Currently on IV Solu-Medrol for COPD exacerbation   Hypokalemia -Repleted   Essential hypertension -continue home amlodipine and hydralazine    DVT prophylaxis: [place] heparin  Code Status: DNR  Family Communication:  Disposition: Status is: Inpatient Remains inpatient appropriate because:    Consultants:  Nephrology  Procedures:  Hemodialysis 5/7  Antimicrobials:  Cefepime 5/7>> Vancomycin - stopped 5/8  Subjective: Pt says she felt better after hemodialysis   Objective: Vitals:   09/08/22 0756 09/08/22 0809 09/08/22 0936 09/08/22 1213  BP:  (!) 127/50 130/78 (!) 131/53  Pulse:  81 84 74  Resp:  20 18 18   Temp:  98.8 F (37.1 C)  100 F (37.8 C)  TempSrc:  Oral  Oral  SpO2: 100% 100% 97% 100%  Weight:   40.8 kg   Height:        Intake/Output Summary (Last 24 hours) at 09/08/2022 1401 Last data filed at 09/08/2022 0830 Gross per 24 hour  Intake 593.68 ml  Output 1900 ml  Net -1306.32 ml   Filed Weights   09/07/22 1435 09/07/22 1514 09/08/22 8119  Weight: 41 kg 41 kg 40.8 kg   Examination:  General exam: Appears calm and comfortable  Respiratory system: Clear to auscultation. Respiratory effort normal. Cardiovascular system: normal S1 & S2 heard. No JVD, murmurs, rubs, gallops or clicks. No pedal edema. Gastrointestinal system: Abdomen is nondistended, soft and nontender. No organomegaly or  masses felt. Normal bowel sounds heard. Central nervous system: Alert and oriented. No focal neurological deficits. Extremities: Symmetric 5 x 5 power. Skin: No rashes, lesions or ulcers. Psychiatry: Judgement and insight appear normal. Mood & affect appropriate.   Data Reviewed: I have personally reviewed following labs and imaging studies  CBC: Recent Labs  Lab 09/07/22 0900 09/08/22 0447  WBC 13.8* 9.4  HGB 9.2* 8.1*  HCT 27.8* 25.1*  MCV 90.3 89.3  PLT 186 210    Basic Metabolic Panel: Recent Labs  Lab 09/07/22 0900 09/08/22 0447  NA 137 135  K 2.6* 3.7  CL 97* 96*  CO2 24 26  GLUCOSE 91 91  BUN 47* 20  CREATININE 3.80* 1.82*  CALCIUM 8.0* 8.0*    CBG: No results for input(s): "GLUCAP" in the last 168 hours.  Recent Results (from the past 240 hour(s))  Resp panel by RT-PCR (RSV, Flu A&B, Covid) Anterior Nasal Swab     Status: None   Collection Time: 09/07/22  9:31 AM   Specimen: Anterior Nasal Swab  Result Value Ref Range Status   SARS Coronavirus 2 by RT PCR NEGATIVE NEGATIVE Final    Comment: (NOTE) SARS-CoV-2 target nucleic acids are NOT DETECTED.  The SARS-CoV-2 RNA is generally detectable in upper respiratory specimens during the acute phase of infection. The lowest concentration of SARS-CoV-2 viral copies this assay can detect is 138 copies/mL. A negative result does not preclude SARS-Cov-2 infection and should not be used as the sole basis for treatment or other patient management decisions. A negative result may occur with  improper specimen collection/handling, submission of specimen other than nasopharyngeal swab, presence of viral mutation(s) within the areas targeted by this assay, and inadequate number of viral copies(<138 copies/mL). A negative result must be combined with clinical observations, patient history, and epidemiological information. The expected result is Negative.  Fact Sheet for Patients:   BloggerCourse.com  Fact Sheet for Healthcare Providers:  SeriousBroker.it  This test is no t yet approved or cleared by the [place] FDA and  has been authorized for detection and/or diagnosis of SARS-CoV-2 by FDA under an Emergency Use Authorization (EUA). This EUA will remain  in effect (meaning this test can be used) for the duration of the COVID-19 declaration under Section 564(b)(1) of the Act, 21 U.S.C.section 360bbb-3(b)(1), unless the authorization is terminated  or revoked sooner.       Influenza A by PCR NEGATIVE NEGATIVE Final   Influenza B by PCR NEGATIVE NEGATIVE Final    Comment: (NOTE) The Xpert Xpress SARS-CoV-2/FLU/RSV plus assay is intended as an aid in the diagnosis of influenza from Nasopharyngeal swab specimens and should not be used as a sole basis for treatment. Nasal washings and aspirates are unacceptable for Xpert Xpress SARS-CoV-2/FLU/RSV testing.  Fact Sheet for Patients: BloggerCourse.com  Fact Sheet for Healthcare Providers: SeriousBroker.it  This test is not yet approved or cleared by the [place] FDA and has been authorized for detection and/or diagnosis of SARS-CoV-2 by FDA under an Emergency Use Authorization (EUA). This EUA will remain in effect (meaning this test can be used) for the duration of the COVID-19 declaration under Section 564(b)(1) of the  Act, 21 U.S.C. section 360bbb-3(b)(1), unless the authorization is terminated or revoked.     Resp Syncytial Virus by PCR NEGATIVE NEGATIVE Final    Comment: (NOTE) Fact Sheet for Patients: BloggerCourse.com  Fact Sheet for Healthcare Providers: SeriousBroker.it  This test is not yet approved or cleared by the [place] FDA and has been authorized for detection and/or diagnosis of SARS-CoV-2 by FDA under an Emergency Use  Authorization (EUA). This EUA will remain in effect (meaning this test can be used) for the duration of the COVID-19 declaration under Section 564(b)(1) of the Act, 21 U.S.C. section 360bbb-3(b)(1), unless the authorization is terminated or revoked.  Performed at [facility], [address], [place], [place] [address]   MRSA Next Gen by PCR, Nasal     Status: None   Collection Time: 09/07/22  3:37 PM   Specimen: Nasal Mucosa; Nasal Swab  Result Value Ref Range Status   MRSA by PCR Next Gen NOT DETECTED NOT DETECTED Final    Comment: (NOTE) The GeneXpert MRSA Assay (FDA approved for NASAL specimens only), is one component of a comprehensive MRSA colonization surveillance program. It is not intended to diagnose MRSA infection nor to guide or monitor treatment for MRSA infections. Test performance is not FDA approved in patients less than 58 years old. Performed at [facility], [address], [place], [place] [address]      Radiology Studies: ECHOCARDIOGRAM COMPLETE  Result Date: 09/08/2022    ECHOCARDIOGRAM REPORT   Patient Name:   ZANIYA SHINOHARA Date of Exam: 09/08/2022 Medical Rec #:  [id number]           Height:       59.0 in Accession #:    [id number]          Weight:       89.9 lb Date of Birth:  06/11/1950           BSA:          1.313 m Patient Age:    71 years            BP:           130/78 mmHg Patient Gender: F                   HR:           76 bpm. [facility] Procedure: 2D Echo, Cardiac Doppler, Color Doppler and 3D Echo Indications:    Dyspnea R06.00  History:        Patient has no prior history of Echocardiogram examinations.                 Signs/Symptoms:Dyspnea; Risk Factors:Former Smoker.  Sonographer:    Aron Baba Referring Phys: [phone] DAVID TAT  Sonographer Comments: Image acquisition challenging due to patient body habitus and Image acquisition challenging due to COPD. IMPRESSIONS  1. Left ventricular ejection fraction, by estimation, is 60 to 65%.  The left ventricle has normal function. The left ventricle has no regional wall motion abnormalities. There is mild concentric left ventricular hypertrophy. Left ventricular diastolic parameters are indeterminate.  2. Right ventricular systolic function is normal. The right ventricular size is normal. There is normal pulmonary artery systolic pressure. The estimated right ventricular systolic pressure is 30.5 mmHg.  3. The mitral valve is grossly normal. Trivial mitral valve regurgitation.  4. The aortic valve was not well visualized. There is mild calcification of the aortic valve. Aortic valve regurgitation is not  visualized. Aortic valve sclerosis is present, with no evidence of aortic valve stenosis.  5. The inferior vena cava is normal in size with greater than 50% respiratory variability, suggesting right atrial pressure of 3 mmHg. Comparison(s): No prior Echocardiogram. FINDINGS  Left Ventricle: Left ventricular ejection fraction, by estimation, is 60 to 65%. The left ventricle has normal function. The left ventricle has no regional wall motion abnormalities. The left ventricular internal cavity size was normal in size. There is  mild concentric left ventricular hypertrophy. Left ventricular diastolic parameters are indeterminate. Right Ventricle: The right ventricular size is normal. No increase in right ventricular wall thickness. Right ventricular systolic function is normal. There is normal pulmonary artery systolic pressure. The tricuspid regurgitant velocity is 2.62 m/s, and  with an assumed right atrial pressure of 3 mmHg, the estimated right ventricular systolic pressure is 30.5 mmHg. Left Atrium: Left atrial size was normal in size. Right Atrium: Right atrial size was normal in size. Pericardium: There is no evidence of pericardial effusion. Mitral Valve: The mitral valve is grossly normal. Trivial mitral valve regurgitation. MV peak gradient, 3.9 mmHg. The mean mitral valve gradient is 2.0 mmHg.  Tricuspid Valve: The tricuspid valve is grossly normal. Tricuspid valve regurgitation is mild. Aortic Valve: The aortic valve was not well visualized. There is mild calcification of the aortic valve. There is mild aortic valve annular calcification. Aortic valve regurgitation is not visualized. Aortic valve sclerosis is present, with no evidence of aortic valve stenosis. Pulmonic Valve: The pulmonic valve was grossly normal. Pulmonic valve regurgitation is trivial. Aorta: The aortic root is normal in size and structure. Venous: The inferior vena cava is normal in size with greater than 50% respiratory variability, suggesting right atrial pressure of 3 mmHg. IAS/Shunts: No atrial level shunt detected by color flow Doppler.  LEFT VENTRICLE PLAX 2D LVIDd:         4.20 cm   Diastology LVIDs:         3.00 cm   LV e' medial:    9.29 cm/s LV PW:         1.30 cm   LV E/e' medial:  10.1 LV IVS:        0.80 cm   LV e' lateral:   10.40 cm/s LVOT diam:     1.70 cm   LV E/e' lateral: 9.0 LVOT Area:     2.27 cm                           3D Volume EF:                          3D EF:        52 %                          LV EDV:       125 ml                          LV ESV:       60 ml                          LV SV:        65 ml RIGHT VENTRICLE RV S prime:     13.20 cm/s TAPSE (M-mode): 2.0 cm LEFT ATRIUM  Index        RIGHT ATRIUM           Index LA diam:        2.70 cm 2.06 cm/m   RA Area:     13.90 cm LA Vol (A2C):   53.9 ml 41.04 ml/m  RA Volume:   34.30 ml  26.12 ml/m LA Vol (A4C):   25.8 ml 19.65 ml/m LA Biplane Vol: 38.6 ml 29.39 ml/m   AORTA Ao Root diam: 2.80 cm Ao Asc diam:  2.60 cm MITRAL VALVE                TRICUSPID VALVE MV Area (PHT): 4.29 cm     TR Peak grad:   27.5 mmHg MV Peak grad:  3.9 mmHg     TR Vmax:        262.00 cm/s MV Mean grad:  2.0 mmHg MV Vmax:       0.98 m/s     SHUNTS MV Vmean:      66.4 cm/s    Systemic Diam: 1.70 cm MV Decel Time: 177 msec MV E velocity: 94.10 cm/s MV A  velocity: 130.00 cm/s MV E/A ratio:  0.72 Nona Dell MD Electronically signed by Nona Dell MD Signature Date/Time: 09/08/2022/12:31:55 PM    Final    CT Chest Wo Contrast  Result Date: 09/07/2022 CLINICAL DATA:  Abnormal chest x-ray EXAM: CT CHEST WITHOUT CONTRAST TECHNIQUE: Multidetector CT imaging of the chest was performed following the standard protocol without IV contrast. RADIATION DOSE REDUCTION: This exam was performed according to the departmental dose-optimization program which includes automated exposure control, adjustment of the mA and/or kV according to patient size and/or use of iterative reconstruction technique. COMPARISON:  Chest x-ray 09/07/2022 and older. Noncontrast CT scan chest 05/16/2022 FINDINGS: Cardiovascular: On this non IV contrast exam, the heart is slightly enlarged. Trace pericardial fluid. There is right IJ large-bore catheter with tip extending to the right atrium. The thoracic aorta has a normal course and caliber with scattered vascular calcifications. There is bovine type aortic arch, a normal variant. Mediastinum/Nodes: Normal caliber thoracic esophagus. The thyroid gland is grossly preserved. On this non IV contrast exam there is no specific abnormal lymph node enlargement seen in the axillary region, hilum or mediastinum. Lungs/Pleura: Small left and moderate right pleural effusion. Adjacent parenchymal opacities. In both lungs there are scattered areas of ground-glass parenchymal patchy opacities. There are some low-grade perihilar changes. However patchy more confluent in the upper lung zones and likely corresponding to the findings by x-ray. This has more infectious or inflammatory in appearance. Differential would include atypical infectious including a viral process. No pneumothorax. Diffuse breathing motion. Upper Abdomen: Adrenal glands are preserved in the upper abdomen. Musculoskeletal: Osteopenia. Mild degenerative changes. Surgical changes along the left  breast with soft tissue thickening is stable. IMPRESSION: Bilateral pleural effusions, moderate right and small left. Adjacent parenchymal opacities. Perihilar hazy low-grade ground-glass changes are seen in the lungs. Possibilities include edema. However there is also more confluence patchy areas of opacity in the upper lung zones which may correspond to the finding by x-ray and could be sequela of and separate infectious or inflammatory process including atypical process. Recommend short follow-up. Aortic Atherosclerosis (ICD10-I70.0). Electronically Signed   By: Karen Kays M.D.   On: 09/07/2022 10:15   DG Chest Port 1 View  Result Date: 09/07/2022 CLINICAL DATA:  Shortness of breath. EXAM: PORTABLE CHEST 1 VIEW COMPARISON:  05/28/2022 FINDINGS: The cardio pericardial silhouette  is enlarged. Diffuse interstitial opacity is associated with patchy basilar airspace disease, right greater than left. Probable tiny bilateral pleural effusions. Right IJ central line tip overlies the SVC/RA junction. Right suprahilar lung lesion is new in the interval, potentially infectious/inflammatory. Telemetry leads overlie the chest. IMPRESSION: 1. Diffuse interstitial opacity with patchy basilar airspace disease, right greater than left. 2. New right suprahilar lung lesion, potentially infectious/inflammatory. CT chest without contrast recommended to further evaluate. Electronically Signed   By: Kennith Center M.D.   On: 09/07/2022 09:03    Scheduled Meds:  amLODipine  10 mg Oral Daily   arformoterol  15 mcg Nebulization BID   atorvastatin  40 mg Oral Daily   budesonide (PULMICORT) nebulizer solution  0.5 mg Nebulization BID   Chlorhexidine Gluconate Cloth  6 each Topical Daily   heparin  5,000 Units Subcutaneous Q8H   hydrALAZINE  50 mg Oral BID   ipratropium-albuterol  3 mL Nebulization Q6H   methylPREDNISolone (SOLU-MEDROL) injection  60 mg Intravenous Q12H   metoprolol succinate  25 mg Oral Daily   pantoprazole   40 mg Oral BID   sertraline  50 mg Oral Daily   Continuous Infusions:  ceFEPime (MAXIPIME) IV     iron sucrose     vancomycin 500 mg (09/07/22 1822)     LOS: 1 day   Time spent: 38 mins  Clanton Emanuelson, MD How to contact the [facility] Attending or Consulting provider 7A - 7P or covering provider during after hours 7P -7A, for this patient?  Check the care team in [facility] and look for a) attending/consulting [facility] provider listed and b) the [facility] team listed Log into [email or web address] and use [facility]'s universal password to access. If you do not have the password, please contact the hospital operator. Locate the [facility] provider you are looking for under [facility] and page to a number that you can be directly reached. If you still have difficulty reaching the provider, please page the [facility] (Director on Call) for the Hospitalists listed on amion for assistance.  09/08/2022, 2:01 PM

## 2022-09-08 NOTE — Consult Note (Signed)
[facility] Renal Consultation Note    Indication for Consultation:  Management of ESRD/hemodialysis; anemia, hypertension/volume and secondary hyperparathyroidism  HPI: Sheri Cooper is a 72 y.o. 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  In the ED VSS, SpO2 86% on room air.  She was placed on oxygen and given IV vancomycin and Zosyn.  Labs notable for WBC 13.8, Hgb 9.2, K 2.6.  CXR with diffuse interstitial opacity  with patchy basilar airspace disease R>L.  New right suprahilar lung lesion noted. CT of chest with bilateral pleural effusions, R>L and patchy areas in upper lung zones.  She was admitted for further evaluation and we were consulted to provide dialysis during her hospitalization.  She did have HD yesterday with 1.9L UF but did have a drop in BP at the end of the treatment.  She reports improvement of her symptoms  Past Medical History:  Diagnosis Date   Chronic kidney disease (CKD), stage IV (severe) (HCC)    COPD (chronic obstructive pulmonary disease) (HCC)    PUD (peptic ulcer disease) 05/2022   Rheumatoid arthritis (HCC)    Past Surgical History:  Procedure Laterality Date   CENTRAL VENOUS CATHETER INSERTION Left 05/27/2022   Procedure: INSERTION CENTRAL LINE ADULT;  Surgeon: Lucretia Roers, MD;  Location: [facility];  Service: General;  Laterality: Left;   COLONOSCOPY WITH PROPOFOL N/A 05/22/2022   Surgeon: Marguerita Merles, Reuel Boom, MD; blood in the terminal ileum and colon.   ESOPHAGOGASTRODUODENOSCOPY (EGD) WITH PROPOFOL N/A 05/22/2022   Surgeon: Marguerita Merles, Reuel Boom, MD; peptic ulcer disease with 2 nonbleeding cratered gastric ulcers, multiple duodenal ulcers, 2 of which had adherent clots that were removed and revealed 2 vessels.  1 vessel was clipped, cauterized, and treated with epinephrine injection.  Second  vessel was clipped and treated with cautery, clip fell off.   IR FLUORO GUIDE CV LINE RIGHT  05/24/2022   IR US GUIDE VASC ACCESS RIGHT  05/24/2022   LAPAROTOMY N/A 05/27/2022   Procedure: EXPLORATORY LAPAROTOMY, OMENTAL PATH, DRAIN PLACEMENT;  Surgeon: Lucretia Roers, MD;  Location: [facility];  Service: General;  Laterality: N/A;   VOLVULUS REDUCTION     colon resection   Family History:   History reviewed. No pertinent family history. Social History:  reports that she has quit smoking. Her smoking use included cigarettes. She has been exposed to tobacco smoke. She has never used smokeless tobacco. She reports that she does not currently use alcohol. She reports that she does not use drugs. No Known Allergies Prior to Admission medications   Medication Sig Start Date End Date Taking? Authorizing Provider  amLODipine (NORVASC) 10 MG tablet Take 10 mg by mouth daily.   Yes [provider]  atorvastatin (LIPITOR) 40 MG tablet Take 40 mg by mouth daily. 07/14/22  Yes [provider]  furosemide (LASIX) 40 MG tablet Take 2 tablets (80 mg total) by mouth daily. Patient taking differently: Take 40 mg by mouth 2 (two) times daily. 06/05/22 06/05/23 Yes Vassie Loll, MD  hydrALAZINE (APRESOLINE) 50 MG tablet Take 50 mg by mouth 2 (two) times daily. 07/14/22  Yes [provider]  metoprolol succinate (TOPROL-XL) 50 MG 24 hr tablet Take 0.5 tablets (25 mg total) by mouth daily. 06/05/22  Yes Vassie Loll, MD  oxyCODONE (ROXICODONE) 5 MG immediate release tablet Take 1-2 tablets (  5-10 mg total) by mouth every 8 (eight) hours as needed for severe pain. 06/05/22 06/05/23 Yes Vassie Loll, MD  pantoprazole (PROTONIX) 40 MG tablet Take 1 tablet (40 mg total) by mouth 2 (two) times daily. 06/05/22 06/05/23 Yes Vassie Loll, MD  predniSONE (DELTASONE) 20 MG tablet Take 3 tablets (60 mg total) by mouth daily. Patient taking differently: Take 20 mg by mouth daily. Take 3 tablets daily 06/05/22   Yes Vassie Loll, MD  sertraline (ZOLOFT) 50 MG tablet Take 1 tablet by mouth daily. 05/12/22 09/07/22 Yes [provider]  hydrALAZINE (APRESOLINE) 100 MG tablet Take 0.5 tablets (50 mg total) by mouth 2 (two) times daily. Patient not taking: Reported on 09/07/2022 06/05/22   Vassie Loll, MD  nystatin (MYCOSTATIN/NYSTOP) powder Apply topically 2 (two) times daily. Apply to affected area (under breast skin and skin folds) Patient not taking: Reported on 08/17/2022 06/05/22   Vassie Loll, MD  rosuvastatin (CRESTOR) 20 MG tablet Take 20 mg by mouth daily.    [provider]   Current Facility-Administered Medications  Medication Dose Route Frequency Provider Last Rate Last Admin   acetaminophen (TYLENOL) tablet 650 mg  650 mg Oral Q6H PRN Tat, Onalee Hua, MD       Or   acetaminophen (TYLENOL) suppository 650 mg  650 mg Rectal Q6H PRN Tat, Onalee Hua, MD       alteplase (CATHFLO ACTIVASE) injection 2 mg  2 mg Intracatheter Once PRN Sabra Heck B, MD       amLODipine (NORVASC) tablet 10 mg  10 mg Oral Daily Tat, David, MD   10 mg at 09/08/22 0944   arformoterol (BROVANA) nebulizer solution 15 mcg  15 mcg Nebulization BID Tat, Onalee Hua, MD   15 mcg at 09/08/22 0755   atorvastatin (LIPITOR) tablet 40 mg  40 mg Oral Daily Tat, David, MD   40 mg at 09/08/22 0944   budesonide (PULMICORT) nebulizer solution 0.5 mg  0.5 mg Nebulization BID Tat, David, MD   0.5 mg at 09/08/22 1610   ceFEPIme (MAXIPIME) 2 g in sodium chloride 0.9 % 100 mL IVPB  2 g Intravenous Q T,Th,Sa-HD Catarina Hartshorn, MD       Chlorhexidine Gluconate Cloth 2 % PADS 6 each  6 each Topical Daily Laural Benes, Clanford L, MD   6 each at 09/08/22 1159   heparin injection 1,000 Units  1,000 Units Intracatheter PRN Arita Miss, MD   3,200 Units at 09/07/22 1955   heparin injection 5,000 Units  5,000 Units Subcutaneous Andres Labrum, MD   5,000 Units at 09/08/22 0529   hydrALAZINE (APRESOLINE) tablet 50 mg  50 mg Oral BID Tat, Onalee Hua, MD   50  mg at 09/08/22 0947   ipratropium-albuterol (DUONEB) 0.5-2.5 (3) MG/3ML nebulizer solution 3 mL  3 mL Nebulization Q6H Tat, Onalee Hua, MD   3 mL at 09/08/22 0750   methylPREDNISolone sodium succinate (SOLU-MEDROL) 125 mg/2 mL injection 60 mg  60 mg Intravenous Pablo Ledger, MD   60 mg at 09/08/22 0945   metoprolol succinate (TOPROL-XL) 24 hr tablet 25 mg  25 mg Oral Daily Tat, David, MD   25 mg at 09/08/22 0951   pantoprazole (PROTONIX) EC tablet 40 mg  40 mg Oral BID Tat, Onalee Hua, MD   40 mg at 09/08/22 0944   prochlorperazine (COMPAZINE) injection 5 mg  5 mg Intravenous Q6H PRN Tat, David, MD       sertraline (ZOLOFT) tablet 50 mg  50 mg Oral Daily Tat,  Onalee Hua, MD   50 mg at 09/08/22 0944   vancomycin (VANCOREADY) IVPB 500 mg/100 mL  500 mg Intravenous Avis Epley, MD 100 mL/hr at 09/07/22 1822 500 mg at 09/07/22 1822   Labs: Basic Metabolic Panel: Recent Labs  Lab 09/07/22 0900 09/08/22 0447  NA 137 135  K 2.6* 3.7  CL 97* 96*  CO2 24 26  GLUCOSE 91 91  BUN 47* 20  CREATININE 3.80* 1.82*  CALCIUM 8.0* 8.0*   Liver Function Tests: Recent Labs  Lab 09/07/22 0900 09/08/22 0447  AST 46* 30  ALT 40 36  ALKPHOS 203* 193*  BILITOT 0.9 0.9  PROT 6.6 6.2*  ALBUMIN 2.7* 2.4*   No results for input(s): "LIPASE", "AMYLASE" in the last 168 hours. No results for input(s): "AMMONIA" in the last 168 hours. CBC: Recent Labs  Lab 09/07/22 0900 09/08/22 0447  WBC 13.8* 9.4  HGB 9.2* 8.1*  HCT 27.8* 25.1*  MCV 90.3 89.3  PLT 186 210   Cardiac Enzymes: No results for input(s): "CKTOTAL", "CKMB", "CKMBINDEX", "TROPONINI" in the last 168 hours. CBG: No results for input(s): "GLUCAP" in the last 168 hours. Iron Studies: No results for input(s): "IRON", "TIBC", "TRANSFERRIN", "FERRITIN" in the last 72 hours. Studies/Results: ECHOCARDIOGRAM COMPLETE  Result Date: 09/08/2022    ECHOCARDIOGRAM REPORT   Patient Name:   KHYLEE GUGLIELMO Date of Exam: 09/08/2022 Medical Rec #:   [id number]           Height:       59.0 in Accession #:    [id number]          Weight:       89.9 lb Date of Birth:  1950-11-22           BSA:          1.313 m Patient Age:    71 years            BP:           130/78 mmHg Patient Gender: F                   HR:           76 bpm. [facility] Procedure: 2D Echo, Cardiac Doppler, Color Doppler and 3D Echo Indications:    Dyspnea R06.00  History:        Patient has no prior history of Echocardiogram examinations.                 Signs/Symptoms:Dyspnea; Risk Factors:Former Smoker.  Sonographer:    Aron Baba Referring Phys: [phone] DAVID TAT  Sonographer Comments: Image acquisition challenging due to patient body habitus and Image acquisition challenging due to COPD. IMPRESSIONS  1. Left ventricular ejection fraction, by estimation, is 60 to 65%. The left ventricle has normal function. The left ventricle has no regional wall motion abnormalities. There is mild concentric left ventricular hypertrophy. Left ventricular diastolic parameters are indeterminate.  2. Right ventricular systolic function is normal. The right ventricular size is normal. There is normal pulmonary artery systolic pressure. The estimated right ventricular systolic pressure is 30.5 mmHg.  3. The mitral valve is grossly normal. Trivial mitral valve regurgitation.  4. The aortic valve was not well visualized. There is mild calcification of the aortic valve. Aortic valve regurgitation is not visualized. Aortic valve sclerosis is present, with no evidence of aortic valve stenosis.  5. The inferior vena cava is normal in size with greater than 50%  respiratory variability, suggesting right atrial pressure of 3 mmHg. Comparison(s): No prior Echocardiogram. FINDINGS  Left Ventricle: Left ventricular ejection fraction, by estimation, is 60 to 65%. The left ventricle has normal function. The left ventricle has no regional wall motion abnormalities. The left ventricular internal cavity size was  normal in size. There is  mild concentric left ventricular hypertrophy. Left ventricular diastolic parameters are indeterminate. Right Ventricle: The right ventricular size is normal. No increase in right ventricular wall thickness. Right ventricular systolic function is normal. There is normal pulmonary artery systolic pressure. The tricuspid regurgitant velocity is 2.62 m/s, and  with an assumed right atrial pressure of 3 mmHg, the estimated right ventricular systolic pressure is 30.5 mmHg. Left Atrium: Left atrial size was normal in size. Right Atrium: Right atrial size was normal in size. Pericardium: There is no evidence of pericardial effusion. Mitral Valve: The mitral valve is grossly normal. Trivial mitral valve regurgitation. MV peak gradient, 3.9 mmHg. The mean mitral valve gradient is 2.0 mmHg. Tricuspid Valve: The tricuspid valve is grossly normal. Tricuspid valve regurgitation is mild. Aortic Valve: The aortic valve was not well visualized. There is mild calcification of the aortic valve. There is mild aortic valve annular calcification. Aortic valve regurgitation is not visualized. Aortic valve sclerosis is present, with no evidence of aortic valve stenosis. Pulmonic Valve: The pulmonic valve was grossly normal. Pulmonic valve regurgitation is trivial. Aorta: The aortic root is normal in size and structure. Venous: The inferior vena cava is normal in size with greater than 50% respiratory variability, suggesting right atrial pressure of 3 mmHg. IAS/Shunts: No atrial level shunt detected by color flow Doppler.  LEFT VENTRICLE PLAX 2D LVIDd:         4.20 cm   Diastology LVIDs:         3.00 cm   LV e' medial:    9.29 cm/s LV PW:         1.30 cm   LV E/e' medial:  10.1 LV IVS:        0.80 cm   LV e' lateral:   10.40 cm/s LVOT diam:     1.70 cm   LV E/e' lateral: 9.0 LVOT Area:     2.27 cm                           3D Volume EF:                          3D EF:        52 %                          LV EDV:        125 ml                          LV ESV:       60 ml                          LV SV:        65 ml RIGHT VENTRICLE RV S prime:     13.20 cm/s TAPSE (M-mode): 2.0 cm LEFT ATRIUM             Index        RIGHT ATRIUM  Index LA diam:        2.70 cm 2.06 cm/m   RA Area:     13.90 cm LA Vol (A2C):   53.9 ml 41.04 ml/m  RA Volume:   34.30 ml  26.12 ml/m LA Vol (A4C):   25.8 ml 19.65 ml/m LA Biplane Vol: 38.6 ml 29.39 ml/m   AORTA Ao Root diam: 2.80 cm Ao Asc diam:  2.60 cm MITRAL VALVE                TRICUSPID VALVE MV Area (PHT): 4.29 cm     TR Peak grad:   27.5 mmHg MV Peak grad:  3.9 mmHg     TR Vmax:        262.00 cm/s MV Mean grad:  2.0 mmHg MV Vmax:       0.98 m/s     SHUNTS MV Vmean:      66.4 cm/s    Systemic Diam: 1.70 cm MV Decel Time: 177 msec MV E velocity: 94.10 cm/s MV A velocity: 130.00 cm/s MV E/A ratio:  0.72 Nona Dell MD Electronically signed by Nona Dell MD Signature Date/Time: 09/08/2022/12:31:55 PM    Final    CT Chest Wo Contrast  Result Date: 09/07/2022 CLINICAL DATA:  Abnormal chest x-ray EXAM: CT CHEST WITHOUT CONTRAST TECHNIQUE: Multidetector CT imaging of the chest was performed following the standard protocol without IV contrast. RADIATION DOSE REDUCTION: This exam was performed according to the departmental dose-optimization program which includes automated exposure control, adjustment of the mA and/or kV according to patient size and/or use of iterative reconstruction technique. COMPARISON:  Chest x-ray 09/07/2022 and older. Noncontrast CT scan chest 05/16/2022 FINDINGS: Cardiovascular: On this non IV contrast exam, the heart is slightly enlarged. Trace pericardial fluid. There is right IJ large-bore catheter with tip extending to the right atrium. The thoracic aorta has a normal course and caliber with scattered vascular calcifications. There is bovine type aortic arch, a normal variant. Mediastinum/Nodes: Normal caliber thoracic esophagus. The thyroid gland is  grossly preserved. On this non IV contrast exam there is no specific abnormal lymph node enlargement seen in the axillary region, hilum or mediastinum. Lungs/Pleura: Small left and moderate right pleural effusion. Adjacent parenchymal opacities. In both lungs there are scattered areas of ground-glass parenchymal patchy opacities. There are some low-grade perihilar changes. However patchy more confluent in the upper lung zones and likely corresponding to the findings by x-ray. This has more infectious or inflammatory in appearance. Differential would include atypical infectious including a viral process. No pneumothorax. Diffuse breathing motion. Upper Abdomen: Adrenal glands are preserved in the upper abdomen. Musculoskeletal: Osteopenia. Mild degenerative changes. Surgical changes along the left breast with soft tissue thickening is stable. IMPRESSION: Bilateral pleural effusions, moderate right and small left. Adjacent parenchymal opacities. Perihilar hazy low-grade ground-glass changes are seen in the lungs. Possibilities include edema. However there is also more confluence patchy areas of opacity in the upper lung zones which may correspond to the finding by x-ray and could be sequela of and separate infectious or inflammatory process including atypical process. Recommend short follow-up. Aortic Atherosclerosis (ICD10-I70.0). Electronically Signed   By: Karen Kays M.D.   On: 09/07/2022 10:15   DG Chest Port 1 View  Result Date: 09/07/2022 CLINICAL DATA:  Shortness of breath. EXAM: PORTABLE CHEST 1 VIEW COMPARISON:  05/28/2022 FINDINGS: The cardio pericardial silhouette is enlarged. Diffuse interstitial opacity is associated with patchy basilar airspace disease, right greater than left. Probable tiny bilateral pleural effusions.  Right IJ central line tip overlies the SVC/RA junction. Right suprahilar lung lesion is new in the interval, potentially infectious/inflammatory. Telemetry leads overlie the chest.  IMPRESSION: 1. Diffuse interstitial opacity with patchy basilar airspace disease, right greater than left. 2. New right suprahilar lung lesion, potentially infectious/inflammatory. CT chest without contrast recommended to further evaluate. Electronically Signed   By: Kennith Center M.D.   On: 09/07/2022 09:03    ROS: Pertinent items are noted in HPI. Physical Exam: Vitals:   09/08/22 0756 09/08/22 0809 09/08/22 0936 09/08/22 1213  BP:  (!) 127/50 130/78 (!) 131/53  Pulse:  81 84 74  Resp:  20 18 18   Temp:  98.8 F (37.1 C)  100 F (37.8 C)  TempSrc:  Oral  Oral  SpO2: 100% 100% 97% 100%  Weight:   40.8 kg   Height:          Weight change:   Intake/Output Summary (Last 24 hours) at 09/08/2022 1248 Last data filed at 09/08/2022 0830 Gross per 24 hour  Intake 593.68 ml  Output 1900 ml  Net -1306.32 ml   BP (!) 131/53 (BP Location: Right Arm)   Pulse 74   Temp 100 F (37.8 C) (Oral)   Resp 18   Ht 4\' 11"  (1.499 m)   Wt 40.8 kg   SpO2 100%   BMI 18.17 kg/m  General appearance: no distress and thin and chronically ill-appearing Head: Normocephalic, without obvious abnormality, atraumatic Resp: clear to auscultation bilaterally Cardio: regular rate and rhythm, S1, S2 normal, no murmur, click, rub or gallop GI: soft, non-tender; bowel sounds normal; no masses,  no organomegaly Extremities: extremities normal, atraumatic, no cyanosis or edema Dialysis Access:  Dialysis Orders: Center: [facility]  on TTS . EDW 41 kg HD Bath 1K/2.5Ca  Time 3:45 Heparin none. Access RIJ TDC BFR 300 DFR 500    Calcitriol  0.5 mcg po/HD  Micera  50 mcg SQ every 2 weeks Venofer  50 mg IV weekly    Assessment/Plan:  Acute hypoxic respiratory failure - likely combination of volume overload and possible infection/COPD exacerbation.  Continue with IV abx and wean oxygen as able.  She symptomatically feels better after HD with 1.9L UD.  ESRD -  plan for HD tomorrow and continue to challenge edw.  She  is currently below edw.  Hypertension/volume  - as above, UF as tolerated.  Anemia  - Hgb trending down.  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 Now that she is ESRD without recovery would wean off.  Currently on solumedrol for COPD exacerbation. H/o GIB - had duodenal ulcer with significant bleed.  Continue to monitor.  Irena Cords, MD [facility], [facility] 09/08/2022, 12:48 PM

## 2022-09-09 LAB — RENAL FUNCTION PANEL
Albumin: 2.5 g/dL — ABNORMAL LOW (ref 3.5–5.0)
Anion gap: 12 (ref 5–15)
BUN: 43 mg/dL — ABNORMAL HIGH (ref 8–23)
CO2: 25 mmol/L (ref 22–32)
Calcium: 8.1 mg/dL — ABNORMAL LOW (ref 8.9–10.3)
Chloride: 97 mmol/L — ABNORMAL LOW (ref 98–111)
Creatinine, Ser: 3.56 mg/dL — ABNORMAL HIGH (ref 0.44–1.00)
GFR, Estimated: 13 mL/min — ABNORMAL LOW (ref >60)
Glucose, Bld: 168 mg/dL — ABNORMAL HIGH (ref 70–99)
Phosphorus: 6 mg/dL — ABNORMAL HIGH (ref 2.5–4.6)
Potassium: 3.7 mmol/L (ref 3.5–5.1)
Sodium: 134 mmol/L — ABNORMAL LOW (ref 135–145)

## 2022-09-09 LAB — CBC
HCT: 23.7 % — ABNORMAL LOW (ref 36.0–46.0)
Hemoglobin: 7.6 g/dL — ABNORMAL LOW (ref 12.0–15.0)
MCH: 29.1 pg (ref 26.0–34.0)
MCHC: 32.1 g/dL (ref 30.0–36.0)
MCV: 90.8 fL (ref 80.0–100.0)
Platelets: 243 10*3/uL (ref 150–400)
RBC: 2.61 MIL/uL — ABNORMAL LOW (ref 3.87–5.11)
RDW: 15.3 % (ref 11.5–15.5)
WBC: 12.2 10*3/uL — ABNORMAL HIGH (ref 4.0–10.5)
nRBC: 0 % (ref 0.0–0.2)

## 2022-09-09 LAB — HEPATITIS B SURFACE ANTIGEN: Hepatitis B Surface Ag: NONREACTIVE

## 2022-09-09 MED ORDER — PREDNISONE 10 MG PO TABS: ORAL_TABLET | ORAL | 0 refills | Status: AC

## 2022-09-09 MED ORDER — METHYLPREDNISOLONE SODIUM SUCC 40 MG IJ SOLR: 40.0000 mg | Freq: Every day | INTRAMUSCULAR | Status: DC

## 2022-09-09 MED ORDER — DOXYCYCLINE HYCLATE 100 MG PO CAPS: 100.0000 mg | ORAL_CAPSULE | Freq: Two times a day (BID) | ORAL | 0 refills | Status: AC

## 2022-09-09 MED ORDER — HEPARIN SODIUM (PORCINE) 1000 UNIT/ML IJ SOLN
INTRAMUSCULAR | Status: AC
  Filled 2022-09-09: qty 4

## 2022-09-09 MED ORDER — FUROSEMIDE 40 MG PO TABS: 40.0000 mg | ORAL_TABLET | Freq: Two times a day (BID) | ORAL | Status: DC

## 2022-09-09 NOTE — Progress Notes (Signed)
  HEMODIALYSIS TREATMENT NOTE:  Uneventful 3.5 hour heparin-free treatment completed using RIJ TDC. Goal met: 1.5 liters removed without interruption in UF.  Hemodynamically stable throughout session. All blood was returned.  Post-HD:  09/09/22 1730  Vitals  Temp 98.4 F (36.9 C)  Temp Source Oral  BP (!) 150/58  MAP (mmHg) 81  BP Location Right Arm  BP Method Automatic  Patient Position (if appropriate) Sitting  Pulse Rate 80  Pulse Rate Source Monitor  Resp 18  Oxygen Therapy  SpO2 99 %  O2 Device Room Air  Post Treatment  Dialyzer Clearance Lightly streaked  Duration of HD Treatment -hour(s) 3.5 hour(s)  Hemodialysis Intake (mL) 0 mL  Liters Processed 83.1  Fluid Removed (mL) 1500 mL  Tolerated HD Treatment Yes  Post-Hemodialysis Comments Goal emt  Hemodialysis Catheter Right Internal jugular Double lumen Permanent (Tunneled)  Placement Date/Time: 05/24/22 0953   Serial / Lot #: [id number]  Expiration Date: 02/03/27  Time Out: Correct patient;Correct site;Correct procedure  Maximum sterile barrier precautions: Hand hygiene;Cap;Mask;Sterile gown;Sterile gloves;Large sterile ...  Site Condition No complications  Blue Lumen Status Flushed;Heparin locked;Dead end cap in place  Red Lumen Status Flushed;Heparin locked;Dead end cap in place  Purple Lumen Status N/A  Catheter fill solution Heparin 1000 units/ml  Catheter fill volume (Arterial) 1.6 cc  Catheter fill volume (Venous) 1.6  Dressing Type Transparent;Tube stabilization device  Dressing Status Antimicrobial disc in place;Clean, Dry, Intact  Dressing Change Due 09/14/22  Post treatment catheter status Capped and Clamped   Arman Filter, RN [facility]

## 2022-09-09 NOTE — Discharge Summary (Signed)
Physician Discharge Summary  Macaiah Walsh Kun ZOX:[id number] DOB: [birth year] DOA: 09/07/2022  PCP: Richardean Chimera, MD  Admit date: 09/07/2022 Discharge date: 09/09/2022  Admitted From: Home with  HH Disposition: Home with [facility]   Recommendations for Outpatient Follow-up:  Follow up with PCP in 1 weeks Resume regular hemodialysis schedule  Tapering off prednisone over next 35 days  Home Health: PT/OT   Discharge Condition: STABLE   CODE STATUS: DNR DIET: renal / fluid restriction   Brief Hospitalization Summary: Please see all hospital notes, images, labs for full details of the hospitalization. 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  The patient endorses compliance with her dialysis.  She was last dialyzed on 09/04/2022 and stayed the whole duration.  She denies any dietary or fluid indiscretion.  She denies any fevers, chills, chest pain, hemoptysis, nausea, vomiting, diarrhea, abdominal pain.  She has had a nonproductive cough. She denies any headache or neck pain.  She denies any hematochezia or melena. Notably, the patient was admitted to the hospital from 05/26/2022  till 06/05/22 secondary to acute on chronic renal failure.  She was initiated on dialysis at that time.  Her hospitalization was prolonged secondary to GI bleed as well as perforated duodenum requiring exploratory laparotomy. In the ED, the patient was afebrile and hemodynamically stable.  Oxygen saturation was 86% on room air.  She was placed on 2 L with saturation 95-97%.  The patient was started on vancomycin and Zosyn in the emergency department.  WBC 13.8, hemoglobin 9.2, platelets 186,000.  Sodium 137, potassium 2.6, bicarbonate 24, serum creatinine 3.80.  EKG shows sinus rhythm with no ST-T wave changes.  CT of the chest showed bilateral pleural effusions, right  greater than left.  There was perihilar GGO with more confluent patchy opacities in the upper lung zones.  HOSPITAL COURSE BY PROBLEM LIST   Acute respiratory failure with hypoxia -Secondary to fluid overload and pneumonia and COPD exacerbation -Presented with tachypnea and hypoxia with saturation 86% on room air -Weaned oxygen back to room air    Lobar pneumonia -09/07/2022 CT chest as discussed above -MRSA screen -treated with IV cefepime and DC home on oral doxycycline -DC vanc with neg MRSA screen   COPD exacerbation - IV Solu-Medrol - reduce dose 5/8  -continue Pulmicort -DC home on prolonged prednisone taper over 35 days   Fluid overload with pleural effusions -Nephrology plans for dialysis 09/07/2022 and 09/09/22  -Echocardiogram   ESRD -Nephrology consulted for maintenance dialysis -Patient dialyzes on Tuesday, Thursday, Saturday -Last dialysis 09/04/2022   FSGS -The patient was taking prednisone 3 tablets daily at home -per nephrology can start weaning off this on outpatient basis -DC home on prolonged prednisone taper per Dr. Arrie Aran with goal to get off prednisone   Hypokalemia -Repleted   Essential hypertension -continue home amlodipine and hydralazine    DVT prophylaxis: [place] heparin  Code Status: DNR  Disposition: home with [facility]  Remains inpatient appropriate because:    Consultants:  Nephrology  Procedures:  Hemodialysis 5/7, 5/9   Antimicrobials:  Cefepime 5/7>> Vancomycin - stopped 5/8   Discharge Diagnoses:  Principal Problem:   Acute respiratory failure with hypoxia (HCC) Active Problems:   COPD with acute exacerbation (HCC)   Lobar pneumonia (HCC)   ESRD (end stage renal disease) (HCC)   Discharge Instructions:  Allergies as of 09/09/2022  No Known Allergies      Medication List     STOP taking these medications    nystatin powder Commonly known as: MYCOSTATIN/NYSTOP   rosuvastatin 20 MG tablet Commonly known as: CRESTOR        TAKE these medications    amLODipine 10 MG tablet Commonly known as: NORVASC Take 10 mg by mouth daily.   atorvastatin 40 MG tablet Commonly known as: LIPITOR Take 40 mg by mouth daily.   doxycycline 100 MG capsule Commonly known as: VIBRAMYCIN Take 1 capsule (100 mg total) by mouth 2 (two) times daily for 2 days. Start taking on: Sep 10, 2022   furosemide 40 MG tablet Commonly known as: Lasix Take 1 tablet (40 mg total) by mouth 2 (two) times daily.   hydrALAZINE 50 MG tablet Commonly known as: APRESOLINE Take 50 mg by mouth 2 (two) times daily. What changed: Another medication with the same name was removed. Continue taking this medication, and follow the directions you see here.   metoprolol succinate 50 MG 24 hr tablet Commonly known as: TOPROL-XL Take 0.5 tablets (25 mg total) by mouth daily.   oxyCODONE 5 MG immediate release tablet Commonly known as: Roxicodone Take 1-2 tablets (5-10 mg total) by mouth every 8 (eight) hours as needed for severe pain.   pantoprazole 40 MG tablet Commonly known as: Protonix Take 1 tablet (40 mg total) by mouth 2 (two) times daily.   predniSONE 10 MG tablet Commonly known as: DELTASONE Take 2 tablets (20 mg total) by mouth daily for 14 days, THEN 1 tablet (10 mg total) daily for 14 days, THEN 0.5 tablets (5 mg total) daily for 7 days. Start taking on: Sep 10, 2022 What changed:  medication strength See the new instructions.   sertraline 50 MG tablet Commonly known as: ZOLOFT Take 1 tablet by mouth daily.        Follow-up Information     [facility].   Specialty: Home Health Services Why: [facility] will resume PT/OT in the home. They will call you to schedule visits. Contact information: [address] [place] [address] [phone]                No Known Allergies Allergies as of 09/09/2022   No Known Allergies      Medication List     STOP taking these  medications    nystatin powder Commonly known as: MYCOSTATIN/NYSTOP   rosuvastatin 20 MG tablet Commonly known as: CRESTOR       TAKE these medications    amLODipine 10 MG tablet Commonly known as: NORVASC Take 10 mg by mouth daily.   atorvastatin 40 MG tablet Commonly known as: LIPITOR Take 40 mg by mouth daily.   doxycycline 100 MG capsule Commonly known as: VIBRAMYCIN Take 1 capsule (100 mg total) by mouth 2 (two) times daily for 2 days. Start taking on: Sep 10, 2022   furosemide 40 MG tablet Commonly known as: Lasix Take 1 tablet (40 mg total) by mouth 2 (two) times daily.   hydrALAZINE 50 MG tablet Commonly known as: APRESOLINE Take 50 mg by mouth 2 (two) times daily. What changed: Another medication with the same name was removed. Continue taking this medication, and follow the directions you see here.   metoprolol succinate 50 MG 24 hr tablet Commonly known as: TOPROL-XL Take 0.5 tablets (25 mg total) by mouth daily.   oxyCODONE 5 MG immediate release tablet Commonly  known as: Roxicodone Take 1-2 tablets (5-10 mg total) by mouth every 8 (eight) hours as needed for severe pain.   pantoprazole 40 MG tablet Commonly known as: Protonix Take 1 tablet (40 mg total) by mouth 2 (two) times daily.   predniSONE 10 MG tablet Commonly known as: DELTASONE Take 2 tablets (20 mg total) by mouth daily for 14 days, THEN 1 tablet (10 mg total) daily for 14 days, THEN 0.5 tablets (5 mg total) daily for 7 days. Start taking on: Sep 10, 2022 What changed:  medication strength See the new instructions.   sertraline 50 MG tablet Commonly known as: ZOLOFT Take 1 tablet by mouth daily.        Procedures/Studies: ECHOCARDIOGRAM COMPLETE  Result Date: 09/08/2022    ECHOCARDIOGRAM REPORT   Patient Name:   DELOMA WACTOR Date of Exam: 09/08/2022 Medical Rec #:  [id number]           Height:       59.0 in Accession #:    [id number]          Weight:       89.9 lb Date of  Birth:  11/12/1950           BSA:          1.313 m Patient Age:    71 years            BP:           130/78 mmHg Patient Gender: F                   HR:           76 bpm. [facility] Procedure: 2D Echo, Cardiac Doppler, Color Doppler and 3D Echo Indications:    Dyspnea R06.00  History:        Patient has no prior history of Echocardiogram examinations.                 Signs/Symptoms:Dyspnea; Risk Factors:Former Smoker.  Sonographer:    Aron Baba Referring Phys: [phone] DAVID TAT  Sonographer Comments: Image acquisition challenging due to patient body habitus and Image acquisition challenging due to COPD. IMPRESSIONS  1. Left ventricular ejection fraction, by estimation, is 60 to 65%. The left ventricle has normal function. The left ventricle has no regional wall motion abnormalities. There is mild concentric left ventricular hypertrophy. Left ventricular diastolic parameters are indeterminate.  2. Right ventricular systolic function is normal. The right ventricular size is normal. There is normal pulmonary artery systolic pressure. The estimated right ventricular systolic pressure is 30.5 mmHg.  3. The mitral valve is grossly normal. Trivial mitral valve regurgitation.  4. The aortic valve was not well visualized. There is mild calcification of the aortic valve. Aortic valve regurgitation is not visualized. Aortic valve sclerosis is present, with no evidence of aortic valve stenosis.  5. The inferior vena cava is normal in size with greater than 50% respiratory variability, suggesting right atrial pressure of 3 mmHg. Comparison(s): No prior Echocardiogram. FINDINGS  Left Ventricle: Left ventricular ejection fraction, by estimation, is 60 to 65%. The left ventricle has normal function. The left ventricle has no regional wall motion abnormalities. The left ventricular internal cavity size was normal in size. There is  mild concentric left ventricular hypertrophy. Left ventricular diastolic parameters  are indeterminate. Right Ventricle: The right ventricular size is normal. No increase in right ventricular wall thickness. Right ventricular systolic function is normal. There is normal pulmonary  artery systolic pressure. The tricuspid regurgitant velocity is 2.62 m/s, and  with an assumed right atrial pressure of 3 mmHg, the estimated right ventricular systolic pressure is 30.5 mmHg. Left Atrium: Left atrial size was normal in size. Right Atrium: Right atrial size was normal in size. Pericardium: There is no evidence of pericardial effusion. Mitral Valve: The mitral valve is grossly normal. Trivial mitral valve regurgitation. MV peak gradient, 3.9 mmHg. The mean mitral valve gradient is 2.0 mmHg. Tricuspid Valve: The tricuspid valve is grossly normal. Tricuspid valve regurgitation is mild. Aortic Valve: The aortic valve was not well visualized. There is mild calcification of the aortic valve. There is mild aortic valve annular calcification. Aortic valve regurgitation is not visualized. Aortic valve sclerosis is present, with no evidence of aortic valve stenosis. Pulmonic Valve: The pulmonic valve was grossly normal. Pulmonic valve regurgitation is trivial. Aorta: The aortic root is normal in size and structure. Venous: The inferior vena cava is normal in size with greater than 50% respiratory variability, suggesting right atrial pressure of 3 mmHg. IAS/Shunts: No atrial level shunt detected by color flow Doppler.  LEFT VENTRICLE PLAX 2D LVIDd:         4.20 cm   Diastology LVIDs:         3.00 cm   LV e' medial:    9.29 cm/s LV PW:         1.30 cm   LV E/e' medial:  10.1 LV IVS:        0.80 cm   LV e' lateral:   10.40 cm/s LVOT diam:     1.70 cm   LV E/e' lateral: 9.0 LVOT Area:     2.27 cm                           3D Volume EF:                          3D EF:        52 %                          LV EDV:       125 ml                          LV ESV:       60 ml                          LV SV:        65 ml RIGHT  VENTRICLE RV S prime:     13.20 cm/s TAPSE (M-mode): 2.0 cm LEFT ATRIUM             Index        RIGHT ATRIUM           Index LA diam:        2.70 cm 2.06 cm/m   RA Area:     13.90 cm LA Vol (A2C):   53.9 ml 41.04 ml/m  RA Volume:   34.30 ml  26.12 ml/m LA Vol (A4C):   25.8 ml 19.65 ml/m LA Biplane Vol: 38.6 ml 29.39 ml/m   AORTA Ao Root diam: 2.80 cm Ao Asc diam:  2.60 cm MITRAL VALVE  TRICUSPID VALVE MV Area (PHT): 4.29 cm     TR Peak grad:   27.5 mmHg MV Peak grad:  3.9 mmHg     TR Vmax:        262.00 cm/s MV Mean grad:  2.0 mmHg MV Vmax:       0.98 m/s     SHUNTS MV Vmean:      66.4 cm/s    Systemic Diam: 1.70 cm MV Decel Time: 177 msec MV E velocity: 94.10 cm/s MV A velocity: 130.00 cm/s MV E/A ratio:  0.72 Nona Dell MD Electronically signed by Nona Dell MD Signature Date/Time: 09/08/2022/12:31:55 PM    Final    CT Chest Wo Contrast  Result Date: 09/07/2022 CLINICAL DATA:  Abnormal chest x-ray EXAM: CT CHEST WITHOUT CONTRAST TECHNIQUE: Multidetector CT imaging of the chest was performed following the standard protocol without IV contrast. RADIATION DOSE REDUCTION: This exam was performed according to the departmental dose-optimization program which includes automated exposure control, adjustment of the mA and/or kV according to patient size and/or use of iterative reconstruction technique. COMPARISON:  Chest x-ray 09/07/2022 and older. Noncontrast CT scan chest 05/16/2022 FINDINGS: Cardiovascular: On this non IV contrast exam, the heart is slightly enlarged. Trace pericardial fluid. There is right IJ large-bore catheter with tip extending to the right atrium. The thoracic aorta has a normal course and caliber with scattered vascular calcifications. There is bovine type aortic arch, a normal variant. Mediastinum/Nodes: Normal caliber thoracic esophagus. The thyroid gland is grossly preserved. On this non IV contrast exam there is no specific abnormal lymph node enlargement seen  in the axillary region, hilum or mediastinum. Lungs/Pleura: Small left and moderate right pleural effusion. Adjacent parenchymal opacities. In both lungs there are scattered areas of ground-glass parenchymal patchy opacities. There are some low-grade perihilar changes. However patchy more confluent in the upper lung zones and likely corresponding to the findings by x-ray. This has more infectious or inflammatory in appearance. Differential would include atypical infectious including a viral process. No pneumothorax. Diffuse breathing motion. Upper Abdomen: Adrenal glands are preserved in the upper abdomen. Musculoskeletal: Osteopenia. Mild degenerative changes. Surgical changes along the left breast with soft tissue thickening is stable. IMPRESSION: Bilateral pleural effusions, moderate right and small left. Adjacent parenchymal opacities. Perihilar hazy low-grade ground-glass changes are seen in the lungs. Possibilities include edema. However there is also more confluence patchy areas of opacity in the upper lung zones which may correspond to the finding by x-ray and could be sequela of and separate infectious or inflammatory process including atypical process. Recommend short follow-up. Aortic Atherosclerosis (ICD10-I70.0). Electronically Signed   By: Karen Kays M.D.   On: 09/07/2022 10:15   DG Chest Port 1 View  Result Date: 09/07/2022 CLINICAL DATA:  Shortness of breath. EXAM: PORTABLE CHEST 1 VIEW COMPARISON:  05/28/2022 FINDINGS: The cardio pericardial silhouette is enlarged. Diffuse interstitial opacity is associated with patchy basilar airspace disease, right greater than left. Probable tiny bilateral pleural effusions. Right IJ central line tip overlies the SVC/RA junction. Right suprahilar lung lesion is new in the interval, potentially infectious/inflammatory. Telemetry leads overlie the chest. IMPRESSION: 1. Diffuse interstitial opacity with patchy basilar airspace disease, right greater than left.  2. New right suprahilar lung lesion, potentially infectious/inflammatory. CT chest without contrast recommended to further evaluate. Electronically Signed   By: Kennith Center M.D.   On: 09/07/2022 09:03     Subjective: Pt reports feeling better, no SOB, no CP, no palpitations.   Discharge Exam: Vitals:  09/09/22 0815 09/09/22 0828  BP:    Pulse:    Resp:    Temp:    SpO2: 94% 91%   Vitals:   09/08/22 2121 09/09/22 0536 09/09/22 0815 09/09/22 0828  BP: (!) 138/51 (!) 139/53    Pulse: 68 68    Resp:      Temp: 99.4 F (37.4 C) 97.7 F (36.5 C)    TempSrc:      SpO2: 96% 100% 94% 91%  Weight:      Height:       General: Pt is alert, awake, not in acute distress Cardiovascular: normal S1/S2 +, no rubs, no gallops Respiratory: CTA bilaterally, no wheezing, no rhonchi Abdominal: Soft, NT, ND, bowel sounds + Extremities: no edema, no cyanosis   The results of significant diagnostics from this hospitalization (including imaging, microbiology, ancillary and laboratory) are listed below for reference.     Microbiology: Recent Results (from the past 240 hour(s))  Resp panel by RT-PCR (RSV, Flu A&B, Covid) Anterior Nasal Swab     Status: None   Collection Time: 09/07/22  9:31 AM   Specimen: Anterior Nasal Swab  Result Value Ref Range Status   SARS Coronavirus 2 by RT PCR NEGATIVE NEGATIVE Final    Comment: (NOTE) SARS-CoV-2 target nucleic acids are NOT DETECTED.  The SARS-CoV-2 RNA is generally detectable in upper respiratory specimens during the acute phase of infection. The lowest concentration of SARS-CoV-2 viral copies this assay can detect is 138 copies/mL. A negative result does not preclude SARS-Cov-2 infection and should not be used as the sole basis for treatment or other patient management decisions. A negative result may occur with  improper specimen collection/handling, submission of specimen other than nasopharyngeal swab, presence of viral mutation(s) within  the areas targeted by this assay, and inadequate number of viral copies(<138 copies/mL). A negative result must be combined with clinical observations, patient history, and epidemiological information. The expected result is Negative.  Fact Sheet for Patients:  BloggerCourse.com  Fact Sheet for Healthcare Providers:  SeriousBroker.it  This test is no t yet approved or cleared by the [place] FDA and  has been authorized for detection and/or diagnosis of SARS-CoV-2 by FDA under an Emergency Use Authorization (EUA). This EUA will remain  in effect (meaning this test can be used) for the duration of the COVID-19 declaration under Section 564(b)(1) of the Act, 21 U.S.C.section 360bbb-3(b)(1), unless the authorization is terminated  or revoked sooner.       Influenza A by PCR NEGATIVE NEGATIVE Final   Influenza B by PCR NEGATIVE NEGATIVE Final    Comment: (NOTE) The Xpert Xpress SARS-CoV-2/FLU/RSV plus assay is intended as an aid in the diagnosis of influenza from Nasopharyngeal swab specimens and should not be used as a sole basis for treatment. Nasal washings and aspirates are unacceptable for Xpert Xpress SARS-CoV-2/FLU/RSV testing.  Fact Sheet for Patients: BloggerCourse.com  Fact Sheet for Healthcare Providers: SeriousBroker.it  This test is not yet approved or cleared by the [place] FDA and has been authorized for detection and/or diagnosis of SARS-CoV-2 by FDA under an Emergency Use Authorization (EUA). This EUA will remain in effect (meaning this test can be used) for the duration of the COVID-19 declaration under Section 564(b)(1) of the Act, 21 U.S.C. section 360bbb-3(b)(1), unless the authorization is terminated or revoked.     Resp Syncytial Virus by PCR NEGATIVE NEGATIVE Final    Comment: (NOTE) Fact Sheet for  Patients: BloggerCourse.com  Fact Sheet for Healthcare  Providers: SeriousBroker.it  This test is not yet approved or cleared by the [place] and has been authorized for detection and/or diagnosis of SARS-CoV-2 by FDA under an Emergency Use Authorization (EUA). This EUA will remain in effect (meaning this test can be used) for the duration of the COVID-19 declaration under Section 564(b)(1) of the Act, 21 U.S.C. section 360bbb-3(b)(1), unless the authorization is terminated or revoked.  Performed at [facility], [address] [place], [place] [address]   MRSA Next Gen by PCR, Nasal     Status: None   Collection Time: 09/07/22  3:37 PM   Specimen: Nasal Mucosa; Nasal Swab  Result Value Ref Range Status   MRSA by PCR Next Gen NOT DETECTED NOT DETECTED Final    Comment: (NOTE) The GeneXpert MRSA Assay (FDA approved for NASAL specimens only), is one component of a comprehensive MRSA colonization surveillance program. It is not intended to diagnose MRSA infection nor to guide or monitor treatment for MRSA infections. Test performance is not FDA approved in patients less than 70 years old. Performed at [facility], [address], [place], [place] [address]      Labs: BNP (last 3 results) Recent Labs    05/16/22 0309  BNP 760.0*   Basic Metabolic Panel: Recent Labs  Lab 09/07/22 0900 09/08/22 0447 09/09/22 0422  NA 137 135 134*  K 2.6* 3.7 3.7  CL 97* 96* 97*  CO2 24 26 25   GLUCOSE 91 91 168*  BUN 47* 20 43*  CREATININE 3.80* 1.82* 3.56*  CALCIUM 8.0* 8.0* 8.1*  PHOS  --   --  6.0*   Liver Function Tests: Recent Labs  Lab 09/07/22 0900 09/08/22 0447 09/09/22 0422  AST 46* 30  --   ALT 40 36  --   ALKPHOS 203* 193*  --   BILITOT 0.9 0.9  --   PROT 6.6 6.2*  --   ALBUMIN 2.7* 2.4* 2.5*   No results for input(s): "LIPASE", "AMYLASE" in the last 168 hours. No results for input(s): "AMMONIA" in the  last 168 hours. CBC: Recent Labs  Lab 09/07/22 0900 09/08/22 0447 09/09/22 0422  WBC 13.8* 9.4 12.2*  HGB 9.2* 8.1* 7.6*  HCT 27.8* 25.1* 23.7*  MCV 90.3 89.3 90.8  PLT 186 210 243   Cardiac Enzymes: No results for input(s): "CKTOTAL", "CKMB", "CKMBINDEX", "TROPONINI" in the last 168 hours. BNP: Invalid input(s): "POCBNP" CBG: No results for input(s): "GLUCAP" in the last 168 hours. D-Dimer No results for input(s): "DDIMER" in the last 72 hours. Hgb A1c No results for input(s): "HGBA1C" in the last 72 hours. Lipid Profile No results for input(s): "CHOL", "HDL", "LDLCALC", "TRIG", "CHOLHDL", "LDLDIRECT" in the last 72 hours. Thyroid function studies No results for input(s): "TSH", "T4TOTAL", "T3FREE", "THYROIDAB" in the last 72 hours.  Invalid input(s): "FREET3" Anemia work up No results for input(s): "VITAMINB12", "FOLATE", "FERRITIN", "TIBC", "IRON", "RETICCTPCT" in the last 72 hours. Urinalysis    Component Value Date/Time   COLORURINE YELLOW 05/28/2022 1005   APPEARANCEUR CLOUDY (A) 05/28/2022 1005   LABSPEC 1.018 05/28/2022 1005   PHURINE 7.0 05/28/2022 1005   GLUCOSEU NEGATIVE 05/28/2022 1005   HGBUR SMALL (A) 05/28/2022 1005   BILIRUBINUR NEGATIVE 05/28/2022 1005   KETONESUR NEGATIVE 05/28/2022 1005   PROTEINUR >=300 (A) 05/28/2022 1005   NITRITE NEGATIVE 05/28/2022 1005   LEUKOCYTESUR MODERATE (A) 05/28/2022 1005   Sepsis Labs Recent Labs  Lab 09/07/22 0900 09/08/22 0447 09/09/22 0422  WBC 13.8* 9.4 12.2*  Microbiology Recent Results (from the past 240 hour(s))  Resp panel by RT-PCR (RSV, Flu A&B, Covid) Anterior Nasal Swab     Status: None   Collection Time: 09/07/22  9:31 AM   Specimen: Anterior Nasal Swab  Result Value Ref Range Status   SARS Coronavirus 2 by RT PCR NEGATIVE NEGATIVE Final    Comment: (NOTE) SARS-CoV-2 target nucleic acids are NOT DETECTED.  The SARS-CoV-2 RNA is generally detectable in upper respiratory specimens during the  acute phase of infection. The lowest concentration of SARS-CoV-2 viral copies this assay can detect is 138 copies/mL. A negative result does not preclude SARS-Cov-2 infection and should not be used as the sole basis for treatment or other patient management decisions. A negative result may occur with  improper specimen collection/handling, submission of specimen other than nasopharyngeal swab, presence of viral mutation(s) within the areas targeted by this assay, and inadequate number of viral copies(<138 copies/mL). A negative result must be combined with clinical observations, patient history, and epidemiological information. The expected result is Negative.  Fact Sheet for Patients:  BloggerCourse.com  Fact Sheet for Healthcare Providers:  SeriousBroker.it  This test is no t yet approved or cleared by the [place] FDA and  has been authorized for detection and/or diagnosis of SARS-CoV-2 by FDA under an Emergency Use Authorization (EUA). This EUA will remain  in effect (meaning this test can be used) for the duration of the COVID-19 declaration under Section 564(b)(1) of the Act, 21 U.S.C.section 360bbb-3(b)(1), unless the authorization is terminated  or revoked sooner.       Influenza A by PCR NEGATIVE NEGATIVE Final   Influenza B by PCR NEGATIVE NEGATIVE Final    Comment: (NOTE) The Xpert Xpress SARS-CoV-2/FLU/RSV plus assay is intended as an aid in the diagnosis of influenza from Nasopharyngeal swab specimens and should not be used as a sole basis for treatment. Nasal washings and aspirates are unacceptable for Xpert Xpress SARS-CoV-2/FLU/RSV testing.  Fact Sheet for Patients: BloggerCourse.com  Fact Sheet for Healthcare Providers: SeriousBroker.it  This test is not yet approved or cleared by the [place] FDA and has been authorized for detection and/or  diagnosis of SARS-CoV-2 by FDA under an Emergency Use Authorization (EUA). This EUA will remain in effect (meaning this test can be used) for the duration of the COVID-19 declaration under Section 564(b)(1) of the Act, 21 U.S.C. section 360bbb-3(b)(1), unless the authorization is terminated or revoked.     Resp Syncytial Virus by PCR NEGATIVE NEGATIVE Final    Comment: (NOTE) Fact Sheet for Patients: BloggerCourse.com  Fact Sheet for Healthcare Providers: SeriousBroker.it  This test is not yet approved or cleared by the [place] FDA and has been authorized for detection and/or diagnosis of SARS-CoV-2 by FDA under an Emergency Use Authorization (EUA). This EUA will remain in effect (meaning this test can be used) for the duration of the COVID-19 declaration under Section 564(b)(1) of the Act, 21 U.S.C. section 360bbb-3(b)(1), unless the authorization is terminated or revoked.  Performed at [facility], [address], [place], [place] [address]   MRSA Next Gen by PCR, Nasal     Status: None   Collection Time: 09/07/22  3:37 PM   Specimen: Nasal Mucosa; Nasal Swab  Result Value Ref Range Status   MRSA by PCR Next Gen NOT DETECTED NOT DETECTED Final    Comment: (NOTE) The GeneXpert MRSA Assay (FDA approved for NASAL specimens only), is one component of a comprehensive MRSA colonization surveillance program. It is  not intended to diagnose MRSA infection nor to guide or monitor treatment for MRSA infections. Test performance is not FDA approved in patients less than 47 years old. Performed at [facility], [address] [place], [place] [address]     Time coordinating discharge: 44 mins   SIGNED:  Standley Dakins, MD  [facility] 09/09/2022, 1:20 PM How to contact the [facility] Attending or Consulting provider 7A - 7P or covering provider during after hours 7P -7A, for this patient?  Check the care team in [facility] and look  for a) attending/consulting TRH provider listed and b) the [facility] team listed Log into [email or web address] and use 's universal password to access. If you do not have the password, please contact the hospital operator. Locate the [facility] provider you are looking for under [facility] and page to a number that you can be directly reached. If you still have difficulty reaching the provider, please page the [facility] (Director on Call) for the Hospitalists listed on amion for assistance.

## 2022-09-09 NOTE — TOC Transition Note (Signed)
Transition of Care [facility]) - CM/SW Discharge Note   Patient Details  Name: Sheri Cooper MRN: [id number] Date of Birth: [birth year]  [facility] CM/SW Contact:  Elliot Gault, LCSW Phone Number: 09/09/2022, 11:01 AM   Clinical Narrative:     Pt admitted from home and is stable for dc today per MD.  Pt is independent in ADLs at home. She is active with [facility] PT/OT and wishes to continue at dc.  Updated Clifton Custard at [facility] of pt's dc for today. They will follow at home.  No other TOC needs identified for dc.  Expected Discharge Plan: Home w Home Health Services Barriers to Discharge: Barriers Resolved   Patient Goals and CMS Choice Patient states their goals for this hospitalization and ongoing recovery are:: return home CMS Medicare.gov Compare Post Acute Care list provided to:: Patient    Expected Discharge Plan and Services Expected Discharge Plan: Home w Home Health Services In-house Referral: Clinical Social Work   Post Acute Care Choice: Resumption of Svcs/PTA Provider Living arrangements for the past 2 months: Apartment Expected Discharge Date: 09/09/22                                    Prior Living Arrangements/Services Living arrangements for the past 2 months: Apartment Lives with:: Self Patient language and need for interpreter reviewed:: Yes Do you feel safe going back to the place where you live?: Yes      Need for Family Participation in Patient Care: No (Comment)   Current home services: Home OT, Home PT Criminal Activity/Legal Involvement Pertinent to Current Situation/Hospitalization: No - Comment as needed  Activities of Daily Living Home Assistive Devices/Equipment: Eyeglasses ADL Screening (condition at time of admission) Patient's cognitive ability adequate to safely complete daily activities?: No Is the patient deaf or have difficulty hearing?: No Does the patient have difficulty seeing, even when wearing  glasses/contacts?: No Does the patient have difficulty concentrating, remembering, or making decisions?: Yes Patient able to express need for assistance with ADLs?: Yes Does the patient have difficulty dressing or bathing?: Yes Independently performs ADLs?: No Communication: Independent Dressing (OT): Needs assistance Is this a change from baseline?: Pre-admission baseline Grooming: Needs assistance Is this a change from baseline?: Pre-admission baseline Feeding: Independent Bathing: Needs assistance Is this a change from baseline?: Pre-admission baseline Toileting: Needs assistance Is this a change from baseline?: Pre-admission baseline In/Out Bed: Needs assistance Is this a change from baseline?: Pre-admission baseline Walks in Home: Needs assistance Is this a change from baseline?: Pre-admission baseline Does the patient have difficulty walking or climbing stairs?: Yes Weakness of Legs: Both Weakness of Arms/Hands: Both  Permission Sought/Granted                  Emotional Assessment Appearance:: Appears stated age Attitude/Demeanor/Rapport: Engaged Affect (typically observed): Pleasant Orientation: : Oriented to Self, Oriented to Place, Oriented to  Time, Oriented to Situation Alcohol / Substance Use: Not Applicable Psych Involvement: No (comment)  Admission diagnosis:  Hypokalemia [E87.6] Respiratory distress [R06.03] Acute respiratory failure with hypoxia (HCC) [J96.01] HCAP (healthcare-associated pneumonia) [J18.9] Patient Active Problem List   Diagnosis Date Noted   Acute respiratory failure with hypoxia (HCC) 09/07/2022   Lobar pneumonia (HCC) 09/07/2022   ESRD (end stage renal disease) (HCC) 09/07/2022   Sepsis due to undetermined organism (HCC) 05/30/2022   Pneumoperitoneum 05/28/2022   Duodenal perforation (HCC) 05/28/2022   Peritonitis (  HCC) 05/28/2022   Pneumonia of right lower lobe due to infectious organism 05/27/2022   Acute upper GI bleed  05/24/2022   Multiple duodenal ulcers 05/24/2022   ABLA (acute blood loss anemia) 05/24/2022   Melena 05/22/2022   HCAP (healthcare-associated pneumonia) 05/16/2022   Hypothermia 05/16/2022   Acute renal failure superimposed on stage 3b chronic kidney disease (HCC) 05/16/2022   Elevated brain natriuretic peptide (BNP) level 05/16/2022   Elevated troponin 05/16/2022   Increased anion gap metabolic acidosis 05/16/2022   Prolonged QT interval 05/16/2022   Hypoalbuminemia due to protein-calorie malnutrition (HCC) 05/16/2022   Iron deficiency anemia 05/16/2022   COPD with acute exacerbation (HCC) 05/16/2022   CKD (chronic kidney disease), stage V (HCC) 05/16/2022   PCP:  Richardean Chimera, MD Pharmacy:   [facility] [place] - [address] [place] [address] Phone: [phone] Fax: [phone]     Social Determinants of Health (SDOH) Interventions Transportation Interventions: Inpatient TOC  Readmission Risk Interventions    05/18/2022   11:51 AM  Readmission Risk Prevention Plan  Transportation Screening Complete  PCP or Specialist Appt within 5-7 Days Not Complete  Home Care Screening Complete  Medication Review (RN CM) Complete     Final next level of care: Home w Home Health Services Barriers to Discharge: Barriers Resolved   Patient Goals and CMS Choice CMS Medicare.gov Compare Post Acute Care list provided to:: Patient    Discharge Placement                         Discharge Plan and Services Additional resources added to the After Visit Summary for   In-house Referral: Clinical Social Work   Post Acute Care Choice: Resumption of Svcs/PTA Provider                               Social Determinants of Health (SDOH) Interventions SDOH Screenings   Food Insecurity: No Food Insecurity (09/07/2022)  Housing: Low Risk  (09/07/2022)  Transportation Needs: Unmet Transportation Needs (09/07/2022)  Utilities: Not At Risk  (09/07/2022)  Alcohol Screen: Low Risk  (08/02/2022)  Depression (PHQ2-9): Low Risk  (08/02/2022)  Financial Resource Strain: Low Risk  (08/02/2022)  Physical Activity: Inactive (08/02/2022)  Social Connections: Unknown (08/02/2022)  Stress: No Stress Concern Present (08/02/2022)  Tobacco Use: Medium Risk (09/07/2022)     Readmission Risk Interventions    05/18/2022   11:51 AM  Readmission Risk Prevention Plan  Transportation Screening Complete  PCP or Specialist Appt within 5-7 Days Not Complete  Home Care Screening Complete  Medication Review (RN CM) Complete

## 2022-09-09 NOTE — Progress Notes (Signed)
Patient ID: LIZBHET STALBAUM, female   DOB: Mar 28, 1951, 72 y.o.   MRN: [id number] S: Feeling better.  Off of supplemental oxygen O:BP (!) 139/53 (BP Location: Right Arm)   Pulse 68   Temp 97.7 F (36.5 C)   Resp 18   Ht 4\' 11"  (1.499 m)   Wt 40.8 kg   SpO2 91%   BMI 18.17 kg/m   Intake/Output Summary (Last 24 hours) at 09/09/2022 1050 Last data filed at 09/09/2022 0600 Gross per 24 hour  Intake 585 ml  Output --  Net 585 ml   Intake/Output: I/O last 3 completed shifts: In: 926.1 [P.O.:720; IV Piggyback:206.1] Out: 1900 [Other:1900]  Intake/Output this shift:  No intake/output data recorded. Weight change: -9.096 kg Gen: Frail, NAD CVS: RRR Resp: CTA Abd: +BS, soft, NT/ND Ext: no edema  Recent Labs  Lab 09/07/22 0900 09/08/22 0447 09/09/22 0422  NA 137 135 134*  K 2.6* 3.7 3.7  CL 97* 96* 97*  CO2 24 26 25   GLUCOSE 91 91 168*  BUN 47* 20 43*  CREATININE 3.80* 1.82* 3.56*  ALBUMIN 2.7* 2.4* 2.5*  CALCIUM 8.0* 8.0* 8.1*  PHOS  --   --  6.0*  AST 46* 30  --   ALT 40 36  --    Liver Function Tests: Recent Labs  Lab 09/07/22 0900 09/08/22 0447 09/09/22 0422  AST 46* 30  --   ALT 40 36  --   ALKPHOS 203* 193*  --   BILITOT 0.9 0.9  --   PROT 6.6 6.2*  --   ALBUMIN 2.7* 2.4* 2.5*   No results for input(s): "LIPASE", "AMYLASE" in the last 168 hours. No results for input(s): "AMMONIA" in the last 168 hours. CBC: Recent Labs  Lab 09/07/22 0900 09/08/22 0447 09/09/22 0422  WBC 13.8* 9.4 12.2*  HGB 9.2* 8.1* 7.6*  HCT 27.8* 25.1* 23.7*  MCV 90.3 89.3 90.8  PLT 186 210 243   Cardiac Enzymes: No results for input(s): "CKTOTAL", "CKMB", "CKMBINDEX", "TROPONINI" in the last 168 hours. CBG: No results for input(s): "GLUCAP" in the last 168 hours.  Iron Studies: No results for input(s): "IRON", "TIBC", "TRANSFERRIN", "FERRITIN" in the last 72 hours. Studies/Results: ECHOCARDIOGRAM COMPLETE  Result Date: 09/08/2022    ECHOCARDIOGRAM REPORT   Patient  Name:   GLENDEAN ONEILL Date of Exam: 09/08/2022 Medical Rec #:  [id number]           Height:       59.0 in Accession #:    [id number]          Weight:       89.9 lb Date of Birth:  07/22/1950           BSA:          1.313 m Patient Age:    71 years            BP:           130/78 mmHg Patient Gender: F                   HR:           76 bpm. [facility] Procedure: 2D Echo, Cardiac Doppler, Color Doppler and 3D Echo Indications:    Dyspnea R06.00  History:        Patient has no prior history of Echocardiogram examinations.                 Signs/Symptoms:Dyspnea; Risk  Factors:Former Smoker.  Sonographer:    Aron Baba Referring Phys: [phone] DAVID TAT  Sonographer Comments: Image acquisition challenging due to patient body habitus and Image acquisition challenging due to COPD. IMPRESSIONS  1. Left ventricular ejection fraction, by estimation, is 60 to 65%. The left ventricle has normal function. The left ventricle has no regional wall motion abnormalities. There is mild concentric left ventricular hypertrophy. Left ventricular diastolic parameters are indeterminate.  2. Right ventricular systolic function is normal. The right ventricular size is normal. There is normal pulmonary artery systolic pressure. The estimated right ventricular systolic pressure is 30.5 mmHg.  3. The mitral valve is grossly normal. Trivial mitral valve regurgitation.  4. The aortic valve was not well visualized. There is mild calcification of the aortic valve. Aortic valve regurgitation is not visualized. Aortic valve sclerosis is present, with no evidence of aortic valve stenosis.  5. The inferior vena cava is normal in size with greater than 50% respiratory variability, suggesting right atrial pressure of 3 mmHg. Comparison(s): No prior Echocardiogram. FINDINGS  Left Ventricle: Left ventricular ejection fraction, by estimation, is 60 to 65%. The left ventricle has normal function. The left ventricle has no regional wall  motion abnormalities. The left ventricular internal cavity size was normal in size. There is  mild concentric left ventricular hypertrophy. Left ventricular diastolic parameters are indeterminate. Right Ventricle: The right ventricular size is normal. No increase in right ventricular wall thickness. Right ventricular systolic function is normal. There is normal pulmonary artery systolic pressure. The tricuspid regurgitant velocity is 2.62 m/s, and  with an assumed right atrial pressure of 3 mmHg, the estimated right ventricular systolic pressure is 30.5 mmHg. Left Atrium: Left atrial size was normal in size. Right Atrium: Right atrial size was normal in size. Pericardium: There is no evidence of pericardial effusion. Mitral Valve: The mitral valve is grossly normal. Trivial mitral valve regurgitation. MV peak gradient, 3.9 mmHg. The mean mitral valve gradient is 2.0 mmHg. Tricuspid Valve: The tricuspid valve is grossly normal. Tricuspid valve regurgitation is mild. Aortic Valve: The aortic valve was not well visualized. There is mild calcification of the aortic valve. There is mild aortic valve annular calcification. Aortic valve regurgitation is not visualized. Aortic valve sclerosis is present, with no evidence of aortic valve stenosis. Pulmonic Valve: The pulmonic valve was grossly normal. Pulmonic valve regurgitation is trivial. Aorta: The aortic root is normal in size and structure. Venous: The inferior vena cava is normal in size with greater than 50% respiratory variability, suggesting right atrial pressure of 3 mmHg. IAS/Shunts: No atrial level shunt detected by color flow Doppler.  LEFT VENTRICLE PLAX 2D LVIDd:         4.20 cm   Diastology LVIDs:         3.00 cm   LV e' medial:    9.29 cm/s LV PW:         1.30 cm   LV E/e' medial:  10.1 LV IVS:        0.80 cm   LV e' lateral:   10.40 cm/s LVOT diam:     1.70 cm   LV E/e' lateral: 9.0 LVOT Area:     2.27 cm                           3D Volume EF:  3D EF:        52 %                          LV EDV:       125 ml                          LV ESV:       60 ml                          LV SV:        65 ml RIGHT VENTRICLE RV S prime:     13.20 cm/s TAPSE (M-mode): 2.0 cm LEFT ATRIUM             Index        RIGHT ATRIUM           Index LA diam:        2.70 cm 2.06 cm/m   RA Area:     13.90 cm LA Vol (A2C):   53.9 ml 41.04 ml/m  RA Volume:   34.30 ml  26.12 ml/m LA Vol (A4C):   25.8 ml 19.65 ml/m LA Biplane Vol: 38.6 ml 29.39 ml/m   AORTA Ao Root diam: 2.80 cm Ao Asc diam:  2.60 cm MITRAL VALVE                TRICUSPID VALVE MV Area (PHT): 4.29 cm     TR Peak grad:   27.5 mmHg MV Peak grad:  3.9 mmHg     TR Vmax:        262.00 cm/s MV Mean grad:  2.0 mmHg MV Vmax:       0.98 m/s     SHUNTS MV Vmean:      66.4 cm/s    Systemic Diam: 1.70 cm MV Decel Time: 177 msec MV E velocity: 94.10 cm/s MV A velocity: 130.00 cm/s MV E/A ratio:  0.72 Nona Dell MD Electronically signed by Nona Dell MD Signature Date/Time: 09/08/2022/12:31:55 PM    Final     amLODipine  10 mg Oral Daily   atorvastatin  40 mg Oral Daily   Chlorhexidine Gluconate Cloth  6 each Topical Daily   heparin  5,000 Units Subcutaneous Q8H   hydrALAZINE  50 mg Oral BID   ipratropium-albuterol  3 mL Nebulization Q6H   [START ON 09/10/2022] methylPREDNISolone (SOLU-MEDROL) injection  40 mg Intravenous Daily   metoprolol succinate  25 mg Oral Daily   pantoprazole  40 mg Oral BID   sertraline  50 mg Oral Daily    BMET    Component Value Date/Time   NA 134 (L) 09/09/2022 0422   K 3.7 09/09/2022 0422   CL 97 (L) 09/09/2022 0422   CO2 25 09/09/2022 0422   GLUCOSE 168 (H) 09/09/2022 0422   BUN 43 (H) 09/09/2022 0422   CREATININE 3.56 (H) 09/09/2022 0422   CALCIUM 8.1 (L) 09/09/2022 0422   GFRNONAA 13 (L) 09/09/2022 0422   CBC    Component Value Date/Time   WBC 12.2 (H) 09/09/2022 0422   RBC 2.61 (L) 09/09/2022 0422   HGB 7.6 (L) 09/09/2022 0422   HCT 23.7 (L)  09/09/2022 0422   PLT 243 09/09/2022 0422   MCV 90.8 09/09/2022 0422   MCH 29.1 09/09/2022 0422   MCHC 32.1 09/09/2022 0422   RDW 15.3 09/09/2022 0422  LYMPHSABS 1.4 05/16/2022 0309   MONOABS 0.6 05/16/2022 0309   EOSABS 0.0 05/16/2022 0309   BASOSABS 0.1 05/16/2022 0309    Dialysis Orders: Center: [facility]  on TTS . EDW 41 kg HD Bath 1K/2.5Ca  Time 3:45 Heparin none. Access RIJ TDC BFR 300 DFR 500    Calcitriol  0.5 mcg po/HD  Micera  50 mcg SQ every 2 weeks Venofer  50 mg IV weekly      Assessment/Plan:  Acute hypoxic respiratory failure - likely combination of volume overload and possible infection/COPD exacerbation.  Continue with IV abx and wean oxygen as able.  She symptomatically feels better after HD with 1.9L UD.  ESRD -  plan for HD today and continue to challenge edw.  She is currently below edw.  Hypertension/volume  - as above, UF as tolerated.  Anemia  - Hgb trending down.  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 Now that she is ESRD without recovery would wean off.  Currently on solumedrol for COPD exacerbation.  Recommend 20 mg prednisone daily and taper to 10 mg in 2 weeks and eventual taper to off since she has been on prednisone for months without renal recovery. H/o GIB - had duodenal ulcer with significant bleed.  Continue to monitor. Disposition - per primary svc.  Irena Cords, MD [facility]

## 2022-09-09 NOTE — Progress Notes (Addendum)
Pt discharged home with brother post HD. VSS. All belongings sent with patient. AVS reviewed and pt acknowledged understanding.

## 2022-09-09 NOTE — Care Management Important Message (Signed)
Important Message  Patient Details  Name: Sheri Cooper MRN: [id number] Date of Birth: 03/09/1951   Medicare Important Message Given:  N/A - LOS <3 / Initial given by admissions     Corey Harold 09/09/2022, 2:21 PM

## 2022-09-10 DIAGNOSIS — E785 Hyperlipidemia, unspecified: Secondary | ICD-10-CM | POA: Diagnosis not present

## 2022-09-10 DIAGNOSIS — Z8711 Personal history of peptic ulcer disease: Secondary | ICD-10-CM | POA: Diagnosis not present

## 2022-09-10 DIAGNOSIS — M069 Rheumatoid arthritis, unspecified: Secondary | ICD-10-CM | POA: Diagnosis not present

## 2022-09-10 DIAGNOSIS — E46 Unspecified protein-calorie malnutrition: Secondary | ICD-10-CM | POA: Diagnosis not present

## 2022-09-10 DIAGNOSIS — Z7952 Long term (current) use of systemic steroids: Secondary | ICD-10-CM | POA: Diagnosis not present

## 2022-09-10 DIAGNOSIS — D631 Anemia in chronic kidney disease: Secondary | ICD-10-CM | POA: Diagnosis not present

## 2022-09-10 DIAGNOSIS — L89312 Pressure ulcer of right buttock, stage 2: Secondary | ICD-10-CM | POA: Diagnosis not present

## 2022-09-10 DIAGNOSIS — Z992 Dependence on renal dialysis: Secondary | ICD-10-CM | POA: Diagnosis not present

## 2022-09-10 DIAGNOSIS — Z87440 Personal history of urinary (tract) infections: Secondary | ICD-10-CM | POA: Diagnosis not present

## 2022-09-10 DIAGNOSIS — J449 Chronic obstructive pulmonary disease, unspecified: Secondary | ICD-10-CM | POA: Diagnosis not present

## 2022-09-10 DIAGNOSIS — Z6820 Body mass index (BMI) 20.0-20.9, adult: Secondary | ICD-10-CM | POA: Diagnosis not present

## 2022-09-10 DIAGNOSIS — I10 Essential (primary) hypertension: Secondary | ICD-10-CM | POA: Diagnosis not present

## 2022-09-10 DIAGNOSIS — N186 End stage renal disease: Secondary | ICD-10-CM | POA: Diagnosis not present

## 2022-09-10 DIAGNOSIS — N051 Unspecified nephritic syndrome with focal and segmental glomerular lesions: Secondary | ICD-10-CM | POA: Diagnosis not present

## 2022-09-10 DIAGNOSIS — Z720 Tobacco use: Secondary | ICD-10-CM | POA: Diagnosis not present

## 2022-09-10 LAB — HEPATITIS B SURFACE ANTIBODY, QUANTITATIVE: Hep B S AB Quant (Post): <3.5 m[IU]/mL — ABNORMAL LOW (ref >9.9)

## 2022-09-11 DIAGNOSIS — Z992 Dependence on renal dialysis: Secondary | ICD-10-CM | POA: Diagnosis not present

## 2022-09-11 DIAGNOSIS — N186 End stage renal disease: Secondary | ICD-10-CM | POA: Diagnosis not present

## 2022-09-13 ENCOUNTER — Encounter: Payer: Self-pay | Admitting: *Deleted

## 2022-09-13 ENCOUNTER — Ambulatory Visit: Payer: Self-pay | Admitting: *Deleted

## 2022-09-13 NOTE — Patient Outreach (Signed)
Care Coordination   Follow Up Visit Note   09/13/2022  Name: LAKHIA BREDEMEIER MRN: [id number] DOB: Jan 26, 1951  Geralynn Ochs Albin is a 72 y.o. year old female who sees Richardean Chimera, MD for primary care. I spoke with Geralynn Ochs Bertling by phone today.  What matters to the patients health and wellness today?  Assist with Providing Higher education careers adviser & Resources.   Goals Addressed               This Visit's Progress     Assist with Tax inspector and Resources. (pt-stated)   On track     Care Coordination Interventions:  Interventions Today    Flowsheet Row Most Recent Value  Chronic Disease   Chronic disease during today's visit Chronic Obstructive Pulmonary Disease (COPD), Chronic Kidney Disease/End Stage Renal Disease (ESRD), Other  [Transportation Deficits]  General Interventions   General Interventions Discussed/Reviewed General Interventions Discussed, General Interventions Reviewed, Annual Eye Exam, Durable Medical Equipment (DME), Community Resources, Level of Care, Communication with, Doctor Visits, Vaccines, Health Screening  [Communication with Primary Care Provider]  Vaccines COVID-19, Flu, Pneumonia, RSV, Shingles, Tetanus/Pertussis/Diphtheria  [Encouraged]  Doctor Visits Discussed/Reviewed Doctor Visits Discussed, Doctor Visits Reviewed, Annual Wellness Visits, PCP, Specialist  [Encouraged]  Health Screening Bone Density, Colonoscopy, Mammogram  [Encouraged]  Durable Medical Equipment (DME) Dan Humphreys, Psychologist, forensic  PCP/Specialist Visits Compliance with follow-up visit  [Encouraged]  Communication with PCP/Specialists, RN  Level of Care Adult Daycare, Air traffic controller, Assisted Living, Personal Care Human resources officer Medicaid, Personal Care Services, [place], Other  [RCATS Application]  Exercise Interventions   Exercise Discussed/Reviewed Exercise Discussed, Assistive device use and maintanence, Exercise Reviewed,  Physical Activity, Weight Managment  [Encouraged]  Physical Activity Discussed/Reviewed Physical Activity Discussed, Home Exercise Program (HEP), Physical Activity Reviewed, Types of exercise  [Encouraged]  Weight Management Weight maintenance  [Encouraged]  Education Interventions   Education Provided Provided Therapist, sports, Provided Web-based Education, Provided Education  Provided Verbal Education On Nutrition, Mental Health/Coping with Illness, Applications, Exercise, Medication, Development worker, community, [employer], When to see the Scientist, research (physical sciences) Medicaid, Personal Care Services, [place], Other  [RCATS Application]  Mental Health Interventions   Mental Health Discussed/Reviewed Mental Health Discussed, Anxiety, Depression, Grief and Loss, Substance Abuse, Suicide, Crisis, Coping Strategies, Mental Health Reviewed  Nutrition Interventions   Nutrition Discussed/Reviewed Nutrition Discussed, Nutrition Reviewed, Adding fruits and vegetables, Fluid intake, Portion sizes, Decreasing sugar intake, Supplmental nutrition, Decreasing salt, Decreasing fats, Increaing proteins  [Encouraged]  Pharmacy Interventions   Pharmacy Dicussed/Reviewed Pharmacy Topics Discussed, Medication Adherence, Pharmacy Topics Reviewed, Affording Medications  [Encouraged]  Safety Interventions   Safety Discussed/Reviewed Safety Discussed, Safety Reviewed, Fall Risk, Home Safety  [Encouraged]  Home Safety Assistive Devices, Need for home safety assessment, Refer for community resources  Advanced Directive Interventions   Advanced Directives Discussed/Reviewed Advanced Directives Discussed, Advanced Directives Reviewed  [Completion Encouraged]     Active Listening & Reflection Utilized.  Verbalization of Feelings Encouraged.  Emotional Support Provided. Caregiver Stress Acknowledged. Caregiver Resources Reviewed. Caregiver Support Groups Provided. Self-Enrollment in Caregiver Support Group of Interest  Emphasized. Problem Solving Interventions Activated. Task-Centered Solutions Employed.   Solution-Focused Strategies Implemented. Acceptance & Commitment Therapy Indicated. Cognitive Behavioral Therapy Initiated. Client-Centered Therapy Performed. Confirmed Continued Involvement with Home Health Physical & Occupational Therapists, through [facility] [phone]).   Confirmed Approval for Transportation Assistance through (RCATS) [facility] [employer] [phone]), with ADTS (Aging, Disability & Transit Services of [place] [phone]). Please Continue  to Review & Discuss Advance Directives (Living Will & Healthcare Power of Attorney Documents) with Family & Obtain Assistance with Completion.   Encouraged Submission of Completed, Signed & Notarized Advance Directives (Living Will & Healthcare Power of Attorney Documents) to Primary Care Provider, Dr. Donzetta Sprung with [facility] (# [phone]), to Scan Into Electronic Medical Record in Epic. Please Contact CSW Directly (# [phone]), if You Have Questions, Need Assistance, or If Additional Social Work Needs Are Identified Between Now & Our Next Scheduled Follow-Up Outreach Call.      SDOH assessments and interventions completed:  Yes.  Care Coordination Interventions:  Yes, provided.   Follow up plan: Follow up call scheduled for 09/28/2022 at 1:15 pm.  Encounter Outcome:  Pt. Visit Completed.   Danford Bad, BSW, MSW, LCSW  Licensed Restaurant manager, fast food [facility]  Mailing [place] [address], [place], [place] [address] Physical Address[address], [place], [place] [address] Toll Free Main # [phone] Fax # [phone] Cell # [phone] [email or web address]

## 2022-09-13 NOTE — Patient Instructions (Signed)
Visit Information  Thank you for taking time to visit with me today. Please don't hesitate to contact me if I can be of assistance to you.   Following are the goals we discussed today:   Goals Addressed               This Visit's Progress     Assist with Tax inspector and Resources. (pt-stated)   On track     Care Coordination Interventions:  Interventions Today    Flowsheet Row Most Recent Value  Chronic Disease   Chronic disease during today's visit Chronic Obstructive Pulmonary Disease (COPD), Chronic Kidney Disease/End Stage Renal Disease (ESRD), Other  [Transportation Deficits]  General Interventions   General Interventions Discussed/Reviewed General Interventions Discussed, General Interventions Reviewed, Annual Eye Exam, Durable Medical Equipment (DME), Community Resources, Level of Care, Communication with, Doctor Visits, Vaccines, Health Screening  [Communication with Primary Care Provider]  Vaccines COVID-19, Flu, Pneumonia, RSV, Shingles, Tetanus/Pertussis/Diphtheria  [Encouraged]  Doctor Visits Discussed/Reviewed Doctor Visits Discussed, Doctor Visits Reviewed, Annual Wellness Visits, PCP, Specialist  [Encouraged]  Health Screening Bone Density, Colonoscopy, Mammogram  [Encouraged]  Durable Medical Equipment (DME) Dan Humphreys, Psychologist, forensic  PCP/Specialist Visits Compliance with follow-up visit  [Encouraged]  Communication with PCP/Specialists, RN  Level of Care Adult Daycare, Air traffic controller, Assisted Living, Personal Care Human resources officer Medicaid, Personal Care Services, [place]-2, Other  [RCATS Application]  Exercise Interventions   Exercise Discussed/Reviewed Exercise Discussed, Assistive device use and maintanence, Exercise Reviewed, Physical Activity, Weight Managment  [Encouraged]  Physical Activity Discussed/Reviewed Physical Activity Discussed, Home Exercise Program (HEP), Physical Activity Reviewed, Types of exercise   [Encouraged]  Weight Management Weight maintenance  [Encouraged]  Education Interventions   Education Provided Provided Therapist, sports, Provided Web-based Education, Provided Education  Provided Verbal Education On Nutrition, Mental Health/Coping with Illness, Applications, Exercise, Medication, Development worker, community, [employer], When to see the Scientist, research (physical sciences) Medicaid, Personal Care Services, [place], Other  [RCATS Application]  Mental Health Interventions   Mental Health Discussed/Reviewed Mental Health Discussed, Anxiety, Depression, Grief and Loss, Substance Abuse, Suicide, Crisis, Coping Strategies, Mental Health Reviewed  Nutrition Interventions   Nutrition Discussed/Reviewed Nutrition Discussed, Nutrition Reviewed, Adding fruits and vegetables, Fluid intake, Portion sizes, Decreasing sugar intake, Supplmental nutrition, Decreasing salt, Decreasing fats, Increaing proteins  [Encouraged]  Pharmacy Interventions   Pharmacy Dicussed/Reviewed Pharmacy Topics Discussed, Medication Adherence, Pharmacy Topics Reviewed, Affording Medications  [Encouraged]  Safety Interventions   Safety Discussed/Reviewed Safety Discussed, Safety Reviewed, Fall Risk, Home Safety  [Encouraged]  Home Safety Assistive Devices, Need for home safety assessment, Refer for community resources  Advanced Directive Interventions   Advanced Directives Discussed/Reviewed Advanced Directives Discussed, Advanced Directives Reviewed  [Completion Encouraged]     Active Listening & Reflection Utilized.  Verbalization of Feelings Encouraged.  Emotional Support Provided. Caregiver Stress Acknowledged. Caregiver Resources Reviewed. Caregiver Support Groups Provided. Self-Enrollment in Caregiver Support Group of Interest Emphasized. Problem Solving Interventions Activated. Task-Centered Solutions Employed.   Solution-Focused Strategies Implemented. Acceptance & Commitment Therapy Indicated. Cognitive Behavioral  Therapy Initiated. Client-Centered Therapy Performed. Confirmed Continued Involvement with Home Health Physical & Occupational Therapists, through [facility] [phone]).   Confirmed Approval for Transportation Assistance through (RCATS) [facility] [employer] [phone]), with ADTS (Aging, Disability & Transit Services of [place] [phone]). Please Continue to Review & Discuss Advance Directives (Living Will & Healthcare Power of Attorney Documents) with Family & Obtain Assistance with Completion.   Encouraged Submission of Completed, Signed & Notarized Advance  Directives (Living Will & Healthcare Power of Attorney Documents) to Primary Care Provider, Dr. Donzetta Sprung with [facility] [phone]# [phone]), to Scan Into Electronic Medical Record in Epic. Please Contact CSW Directly (# [phone]), if You Have Questions, Need Assistance, or If Additional Social Work Needs Are Identified Between Now & Our Next Scheduled Follow-Up Outreach Call.      Our next appointment is by telephone on 09/28/2022 at 1:15 pm.  Please call the care guide team at [phone] if you need to cancel or reschedule your appointment.   If you are experiencing a Mental Health or Behavioral Health Crisis or need someone to talk to, please call the Suicide and Crisis Lifeline: 988 call the [place] National Suicide Prevention Lifeline: 915-424-7140 or TTY: 509-866-9925 TTY (289)456-3739) to talk to a trained counselor call 1-800-273-TALK (toll free, 24 hour hotline) go to [facility] [address], [place] [phone]) call the [facility] Crisis Line: [phone] call 911  Patient verbalizes understanding of instructions and care plan provided today and agrees to view in MyChart. Active MyChart status and patient understanding of how to access instructions and care plan via MyChart  confirmed with patient.     Telephone follow up appointment with care management team member scheduled for:  09/28/2022 at 1:15 pm.  Danford Bad, BSW, MSW, LCSW  Licensed Clinical Social Worker  [employer] [facility]  Mailing [place]. [address], [place], [place] [address] Physical Address[address] [place], [place] [address] Toll Free Main # [phone] Fax # [phone] Cell # [phone] [email or web address]

## 2022-09-14 DIAGNOSIS — M1991 Primary osteoarthritis, unspecified site: Secondary | ICD-10-CM | POA: Insufficient documentation

## 2022-09-14 DIAGNOSIS — Z992 Dependence on renal dialysis: Secondary | ICD-10-CM | POA: Diagnosis not present

## 2022-09-14 DIAGNOSIS — N186 End stage renal disease: Secondary | ICD-10-CM | POA: Diagnosis not present

## 2022-09-14 DIAGNOSIS — R634 Abnormal weight loss: Secondary | ICD-10-CM | POA: Insufficient documentation

## 2022-09-15 ENCOUNTER — Encounter: Payer: Medicare Other | Admitting: Vascular Surgery

## 2022-09-15 DIAGNOSIS — I10 Essential (primary) hypertension: Secondary | ICD-10-CM | POA: Diagnosis not present

## 2022-09-15 DIAGNOSIS — J449 Chronic obstructive pulmonary disease, unspecified: Secondary | ICD-10-CM | POA: Diagnosis not present

## 2022-09-15 DIAGNOSIS — Z87440 Personal history of urinary (tract) infections: Secondary | ICD-10-CM | POA: Diagnosis not present

## 2022-09-15 DIAGNOSIS — E46 Unspecified protein-calorie malnutrition: Secondary | ICD-10-CM | POA: Diagnosis not present

## 2022-09-15 DIAGNOSIS — M069 Rheumatoid arthritis, unspecified: Secondary | ICD-10-CM | POA: Diagnosis not present

## 2022-09-15 DIAGNOSIS — Z8711 Personal history of peptic ulcer disease: Secondary | ICD-10-CM | POA: Diagnosis not present

## 2022-09-15 DIAGNOSIS — Z6820 Body mass index (BMI) 20.0-20.9, adult: Secondary | ICD-10-CM | POA: Diagnosis not present

## 2022-09-15 DIAGNOSIS — Z720 Tobacco use: Secondary | ICD-10-CM | POA: Diagnosis not present

## 2022-09-15 DIAGNOSIS — N051 Unspecified nephritic syndrome with focal and segmental glomerular lesions: Secondary | ICD-10-CM | POA: Diagnosis not present

## 2022-09-15 DIAGNOSIS — N186 End stage renal disease: Secondary | ICD-10-CM | POA: Diagnosis not present

## 2022-09-15 DIAGNOSIS — Z992 Dependence on renal dialysis: Secondary | ICD-10-CM | POA: Diagnosis not present

## 2022-09-15 DIAGNOSIS — L89312 Pressure ulcer of right buttock, stage 2: Secondary | ICD-10-CM | POA: Diagnosis not present

## 2022-09-15 DIAGNOSIS — Z7952 Long term (current) use of systemic steroids: Secondary | ICD-10-CM | POA: Diagnosis not present

## 2022-09-15 DIAGNOSIS — E785 Hyperlipidemia, unspecified: Secondary | ICD-10-CM | POA: Diagnosis not present

## 2022-09-15 DIAGNOSIS — D631 Anemia in chronic kidney disease: Secondary | ICD-10-CM | POA: Diagnosis not present

## 2022-09-16 DIAGNOSIS — N186 End stage renal disease: Secondary | ICD-10-CM | POA: Diagnosis not present

## 2022-09-16 DIAGNOSIS — Z992 Dependence on renal dialysis: Secondary | ICD-10-CM | POA: Diagnosis not present

## 2022-09-17 DIAGNOSIS — E785 Hyperlipidemia, unspecified: Secondary | ICD-10-CM | POA: Diagnosis not present

## 2022-09-17 DIAGNOSIS — Z87440 Personal history of urinary (tract) infections: Secondary | ICD-10-CM | POA: Diagnosis not present

## 2022-09-17 DIAGNOSIS — Z8711 Personal history of peptic ulcer disease: Secondary | ICD-10-CM | POA: Diagnosis not present

## 2022-09-17 DIAGNOSIS — L89312 Pressure ulcer of right buttock, stage 2: Secondary | ICD-10-CM | POA: Diagnosis not present

## 2022-09-17 DIAGNOSIS — D631 Anemia in chronic kidney disease: Secondary | ICD-10-CM | POA: Diagnosis not present

## 2022-09-17 DIAGNOSIS — M069 Rheumatoid arthritis, unspecified: Secondary | ICD-10-CM | POA: Diagnosis not present

## 2022-09-17 DIAGNOSIS — I10 Essential (primary) hypertension: Secondary | ICD-10-CM | POA: Diagnosis not present

## 2022-09-17 DIAGNOSIS — Z7952 Long term (current) use of systemic steroids: Secondary | ICD-10-CM | POA: Diagnosis not present

## 2022-09-17 DIAGNOSIS — Z6820 Body mass index (BMI) 20.0-20.9, adult: Secondary | ICD-10-CM | POA: Diagnosis not present

## 2022-09-17 DIAGNOSIS — J449 Chronic obstructive pulmonary disease, unspecified: Secondary | ICD-10-CM | POA: Diagnosis not present

## 2022-09-17 DIAGNOSIS — Z992 Dependence on renal dialysis: Secondary | ICD-10-CM | POA: Diagnosis not present

## 2022-09-17 DIAGNOSIS — N186 End stage renal disease: Secondary | ICD-10-CM | POA: Diagnosis not present

## 2022-09-17 DIAGNOSIS — N051 Unspecified nephritic syndrome with focal and segmental glomerular lesions: Secondary | ICD-10-CM | POA: Diagnosis not present

## 2022-09-17 DIAGNOSIS — E46 Unspecified protein-calorie malnutrition: Secondary | ICD-10-CM | POA: Diagnosis not present

## 2022-09-17 DIAGNOSIS — Z720 Tobacco use: Secondary | ICD-10-CM | POA: Diagnosis not present

## 2022-09-18 DIAGNOSIS — Z992 Dependence on renal dialysis: Secondary | ICD-10-CM | POA: Diagnosis not present

## 2022-09-18 DIAGNOSIS — N186 End stage renal disease: Secondary | ICD-10-CM | POA: Diagnosis not present

## 2022-09-20 DIAGNOSIS — J440 Chronic obstructive pulmonary disease with acute lower respiratory infection: Secondary | ICD-10-CM | POA: Diagnosis not present

## 2022-09-20 DIAGNOSIS — E785 Hyperlipidemia, unspecified: Secondary | ICD-10-CM | POA: Diagnosis not present

## 2022-09-20 DIAGNOSIS — Z8711 Personal history of peptic ulcer disease: Secondary | ICD-10-CM | POA: Diagnosis not present

## 2022-09-20 DIAGNOSIS — N186 End stage renal disease: Secondary | ICD-10-CM | POA: Diagnosis not present

## 2022-09-20 DIAGNOSIS — Z992 Dependence on renal dialysis: Secondary | ICD-10-CM | POA: Diagnosis not present

## 2022-09-20 DIAGNOSIS — D631 Anemia in chronic kidney disease: Secondary | ICD-10-CM | POA: Diagnosis not present

## 2022-09-20 DIAGNOSIS — Z6820 Body mass index (BMI) 20.0-20.9, adult: Secondary | ICD-10-CM | POA: Diagnosis not present

## 2022-09-20 DIAGNOSIS — Z87440 Personal history of urinary (tract) infections: Secondary | ICD-10-CM | POA: Diagnosis not present

## 2022-09-20 DIAGNOSIS — I509 Heart failure, unspecified: Secondary | ICD-10-CM | POA: Diagnosis not present

## 2022-09-20 DIAGNOSIS — L89312 Pressure ulcer of right buttock, stage 2: Secondary | ICD-10-CM | POA: Diagnosis not present

## 2022-09-20 DIAGNOSIS — E46 Unspecified protein-calorie malnutrition: Secondary | ICD-10-CM | POA: Diagnosis not present

## 2022-09-20 DIAGNOSIS — Z87891 Personal history of nicotine dependence: Secondary | ICD-10-CM | POA: Diagnosis not present

## 2022-09-20 DIAGNOSIS — J441 Chronic obstructive pulmonary disease with (acute) exacerbation: Secondary | ICD-10-CM | POA: Diagnosis not present

## 2022-09-20 DIAGNOSIS — I110 Hypertensive heart disease with heart failure: Secondary | ICD-10-CM | POA: Diagnosis not present

## 2022-09-20 DIAGNOSIS — M069 Rheumatoid arthritis, unspecified: Secondary | ICD-10-CM | POA: Diagnosis not present

## 2022-09-20 DIAGNOSIS — N051 Unspecified nephritic syndrome with focal and segmental glomerular lesions: Secondary | ICD-10-CM | POA: Diagnosis not present

## 2022-09-20 DIAGNOSIS — Z7952 Long term (current) use of systemic steroids: Secondary | ICD-10-CM | POA: Diagnosis not present

## 2022-09-21 DIAGNOSIS — Z992 Dependence on renal dialysis: Secondary | ICD-10-CM | POA: Diagnosis not present

## 2022-09-21 DIAGNOSIS — N186 End stage renal disease: Secondary | ICD-10-CM | POA: Diagnosis not present

## 2022-09-22 DIAGNOSIS — I110 Hypertensive heart disease with heart failure: Secondary | ICD-10-CM | POA: Diagnosis not present

## 2022-09-22 DIAGNOSIS — N186 End stage renal disease: Secondary | ICD-10-CM | POA: Diagnosis not present

## 2022-09-22 DIAGNOSIS — J441 Chronic obstructive pulmonary disease with (acute) exacerbation: Secondary | ICD-10-CM | POA: Diagnosis not present

## 2022-09-22 DIAGNOSIS — I509 Heart failure, unspecified: Secondary | ICD-10-CM | POA: Diagnosis not present

## 2022-09-22 DIAGNOSIS — M069 Rheumatoid arthritis, unspecified: Secondary | ICD-10-CM | POA: Diagnosis not present

## 2022-09-22 DIAGNOSIS — Z6820 Body mass index (BMI) 20.0-20.9, adult: Secondary | ICD-10-CM | POA: Diagnosis not present

## 2022-09-22 DIAGNOSIS — J440 Chronic obstructive pulmonary disease with acute lower respiratory infection: Secondary | ICD-10-CM | POA: Diagnosis not present

## 2022-09-22 DIAGNOSIS — D631 Anemia in chronic kidney disease: Secondary | ICD-10-CM | POA: Diagnosis not present

## 2022-09-22 DIAGNOSIS — Z7952 Long term (current) use of systemic steroids: Secondary | ICD-10-CM | POA: Diagnosis not present

## 2022-09-22 DIAGNOSIS — L89312 Pressure ulcer of right buttock, stage 2: Secondary | ICD-10-CM | POA: Diagnosis not present

## 2022-09-22 DIAGNOSIS — Z8711 Personal history of peptic ulcer disease: Secondary | ICD-10-CM | POA: Diagnosis not present

## 2022-09-22 DIAGNOSIS — E46 Unspecified protein-calorie malnutrition: Secondary | ICD-10-CM | POA: Diagnosis not present

## 2022-09-22 DIAGNOSIS — E785 Hyperlipidemia, unspecified: Secondary | ICD-10-CM | POA: Diagnosis not present

## 2022-09-22 DIAGNOSIS — Z992 Dependence on renal dialysis: Secondary | ICD-10-CM | POA: Diagnosis not present

## 2022-09-22 DIAGNOSIS — Z87440 Personal history of urinary (tract) infections: Secondary | ICD-10-CM | POA: Diagnosis not present

## 2022-09-22 DIAGNOSIS — Z87891 Personal history of nicotine dependence: Secondary | ICD-10-CM | POA: Diagnosis not present

## 2022-09-22 DIAGNOSIS — N051 Unspecified nephritic syndrome with focal and segmental glomerular lesions: Secondary | ICD-10-CM | POA: Diagnosis not present

## 2022-09-23 DIAGNOSIS — N186 End stage renal disease: Secondary | ICD-10-CM | POA: Diagnosis not present

## 2022-09-23 DIAGNOSIS — Z992 Dependence on renal dialysis: Secondary | ICD-10-CM | POA: Diagnosis not present

## 2022-09-24 DIAGNOSIS — Z992 Dependence on renal dialysis: Secondary | ICD-10-CM | POA: Diagnosis not present

## 2022-09-24 DIAGNOSIS — J441 Chronic obstructive pulmonary disease with (acute) exacerbation: Secondary | ICD-10-CM | POA: Diagnosis not present

## 2022-09-24 DIAGNOSIS — L89312 Pressure ulcer of right buttock, stage 2: Secondary | ICD-10-CM | POA: Diagnosis not present

## 2022-09-24 DIAGNOSIS — Z87440 Personal history of urinary (tract) infections: Secondary | ICD-10-CM | POA: Diagnosis not present

## 2022-09-24 DIAGNOSIS — Z7952 Long term (current) use of systemic steroids: Secondary | ICD-10-CM | POA: Diagnosis not present

## 2022-09-24 DIAGNOSIS — Z6820 Body mass index (BMI) 20.0-20.9, adult: Secondary | ICD-10-CM | POA: Diagnosis not present

## 2022-09-24 DIAGNOSIS — I509 Heart failure, unspecified: Secondary | ICD-10-CM | POA: Diagnosis not present

## 2022-09-24 DIAGNOSIS — D631 Anemia in chronic kidney disease: Secondary | ICD-10-CM | POA: Diagnosis not present

## 2022-09-24 DIAGNOSIS — M069 Rheumatoid arthritis, unspecified: Secondary | ICD-10-CM | POA: Diagnosis not present

## 2022-09-24 DIAGNOSIS — N051 Unspecified nephritic syndrome with focal and segmental glomerular lesions: Secondary | ICD-10-CM | POA: Diagnosis not present

## 2022-09-24 DIAGNOSIS — E46 Unspecified protein-calorie malnutrition: Secondary | ICD-10-CM | POA: Diagnosis not present

## 2022-09-24 DIAGNOSIS — E785 Hyperlipidemia, unspecified: Secondary | ICD-10-CM | POA: Diagnosis not present

## 2022-09-24 DIAGNOSIS — J440 Chronic obstructive pulmonary disease with acute lower respiratory infection: Secondary | ICD-10-CM | POA: Diagnosis not present

## 2022-09-24 DIAGNOSIS — Z8711 Personal history of peptic ulcer disease: Secondary | ICD-10-CM | POA: Diagnosis not present

## 2022-09-24 DIAGNOSIS — Z87891 Personal history of nicotine dependence: Secondary | ICD-10-CM | POA: Diagnosis not present

## 2022-09-24 DIAGNOSIS — I110 Hypertensive heart disease with heart failure: Secondary | ICD-10-CM | POA: Diagnosis not present

## 2022-09-24 DIAGNOSIS — N186 End stage renal disease: Secondary | ICD-10-CM | POA: Diagnosis not present

## 2022-09-25 DIAGNOSIS — N186 End stage renal disease: Secondary | ICD-10-CM | POA: Diagnosis not present

## 2022-09-25 DIAGNOSIS — Z992 Dependence on renal dialysis: Secondary | ICD-10-CM | POA: Diagnosis not present

## 2022-09-28 ENCOUNTER — Encounter: Payer: Self-pay | Admitting: *Deleted

## 2022-09-28 ENCOUNTER — Ambulatory Visit: Payer: Self-pay | Admitting: *Deleted

## 2022-09-28 DIAGNOSIS — N186 End stage renal disease: Secondary | ICD-10-CM | POA: Diagnosis not present

## 2022-09-28 DIAGNOSIS — Z992 Dependence on renal dialysis: Secondary | ICD-10-CM | POA: Diagnosis not present

## 2022-09-28 NOTE — Patient Outreach (Signed)
Care Coordination   Follow Up Visit Note   09/28/2022  Name: ANNALEAH WAREING MRN: [id number] DOB: [birth year]  Geralynn Ochs Haegele is a 72 y.o. year old female who sees Richardean Chimera, MD for primary care. I spoke with Geralynn Ochs Xin by phone today.  What matters to the patients health and wellness today?   Assist with Tax inspector and Resources.   Goals Addressed               This Visit's Progress     COMPLETED: Assist with Tax inspector and Resources. (pt-stated)   On track     Care Coordination Interventions:  Interventions Today    Flowsheet Row Most Recent Value  Chronic Disease   Chronic disease during today's visit Chronic Obstructive Pulmonary Disease (COPD), Chronic Kidney Disease/End Stage Renal Disease (ESRD), Other  [Transportation Deficits]  General Interventions   General Interventions Discussed/Reviewed General Interventions Discussed, General Interventions Reviewed, Annual Eye Exam, Durable Medical Equipment (DME), Community Resources, Level of Care, Communication with, Doctor Visits, Vaccines, Health Screening  [Communication with Primary Care Provider]  Vaccines COVID-19, Flu, Pneumonia, RSV, Shingles, Tetanus/Pertussis/Diphtheria  [Encouraged]  Doctor Visits Discussed/Reviewed Doctor Visits Discussed, Doctor Visits Reviewed, Annual Wellness Visits, PCP, Specialist  [Encouraged]  Health Screening Bone Density, Colonoscopy, Mammogram  [Encouraged]  Durable Medical Equipment (DME) Dan Humphreys, Psychologist, forensic  PCP/Specialist Visits Compliance with follow-up visit  [Encouraged]  Communication with PCP/Specialists, RN  Level of Care Adult Daycare, Air traffic controller, Assisted Living, Personal Care Human resources officer Medicaid, Personal Care Services, [place]-2, Other  [RCATS Application]  Exercise Interventions   Exercise Discussed/Reviewed Exercise Discussed, Assistive device use and maintanence, Exercise  Reviewed, Physical Activity, Weight Managment  [Encouraged]  Physical Activity Discussed/Reviewed Physical Activity Discussed, Home Exercise Program (HEP), Physical Activity Reviewed, Types of exercise  [Encouraged]  Weight Management Weight maintenance  [Encouraged]  Education Interventions   Education Provided Provided Therapist, sports, Provided Web-based Education, Provided Education  Provided Verbal Education On Nutrition, Mental Health/Coping with Illness, Applications, Exercise, Medication, Development worker, community, [employer], When to see the Scientist, research (physical sciences) Medicaid, Personal Care Services, [place]-2, Other  [RCATS Application]  Mental Health Interventions   Mental Health Discussed/Reviewed Mental Health Discussed, Anxiety, Depression, Grief and Loss, Substance Abuse, Suicide, Crisis, Coping Strategies, Mental Health Reviewed  Nutrition Interventions   Nutrition Discussed/Reviewed Nutrition Discussed, Nutrition Reviewed, Adding fruits and vegetables, Fluid intake, Portion sizes, Decreasing sugar intake, Supplmental nutrition, Decreasing salt, Decreasing fats, Increaing proteins  [Encouraged]  Pharmacy Interventions   Pharmacy Dicussed/Reviewed Pharmacy Topics Discussed, Medication Adherence, Pharmacy Topics Reviewed, Affording Medications  [Encouraged]  Safety Interventions   Safety Discussed/Reviewed Safety Discussed, Safety Reviewed, Fall Risk, Home Safety  [Encouraged]  Home Safety Assistive Devices, Need for home safety assessment, Refer for community resources  Advanced Directive Interventions   Advanced Directives Discussed/Reviewed Advanced Directives Discussed, Advanced Directives Reviewed  [Completion Encouraged]     Active Listening & Reflection Utilized.  Verbalization of Feelings Encouraged.  Emotional Support Provided. Caregiver Stress Acknowledged. Caregiver Resources Reviewed. Self-Enrollment in Caregiver Support Group of Interest Emphasized. Problem Solving  Interventions Activated. Task-Centered Solutions Employed.   Solution-Focused Strategies Implemented. Acceptance & Commitment Therapy Indicated. Cognitive Behavioral Therapy Initiated. Client-Centered Therapy Performed. Confirmed Continued Involvement with Home Health Physical & Occupational Therapists, through [facility] [phone]).   Confirmed Transportation Assistance to Dialysis Treatments on Tuesday's, Thursday's & Saturday's, through (RCATS) [facility] [employer] [phone]), with ADTS (Aging, Disability & Transit Services of [place] (#  [phone]). Encouraged Submission of Completed, Signed & Notarized Advance Directives (Living Will & Healthcare Power of Attorney Documents) to Primary Care Provider, Dr. Donzetta Sprung with [facility] (# [phone]), to Scan Into Electronic Medical Record in Epic. Please Contact CSW Directly (# [phone]), if You Have Questions, Need Assistance, or If Additional Social Work Needs Are Identified in The Near Future.      SDOH assessments and interventions completed:  Yes.  Care Coordination Interventions:  Yes, provided.   Follow up plan: No further intervention required.   Encounter Outcome:  Pt. Visit Completed.   Danford Bad, BSW, MSW, LCSW  Licensed Restaurant manager, fast food [facility]  Mailing [place] [address] [place], [place] [address] Physical Address[address] [place], [place] [address] Toll Free Main # [phone] Fax # [phone] Cell # [phone] [email or web address]

## 2022-09-28 NOTE — Patient Instructions (Signed)
Visit Information  Thank you for taking time to visit with me today. Please don't hesitate to contact me if I can be of assistance to you.   Following are the goals we discussed today:   Goals Addressed               This Visit's Progress     COMPLETED: Assist with Providing Personal assistant. (pt-stated)   On track     Care Coordination Interventions:  Interventions Today    Flowsheet Row Most Recent Value  Chronic Disease   Chronic disease during today's visit Chronic Obstructive Pulmonary Disease (COPD), Chronic Kidney Disease/End Stage Renal Disease (ESRD), Other  [Transportation Deficits]  General Interventions   General Interventions Discussed/Reviewed General Interventions Discussed, General Interventions Reviewed, Annual Eye Exam, Durable Medical Equipment (DME), [employer], Level of Care, Communication with, Doctor Visits, Vaccines, Health Screening  [Communication with Primary Care Provider]  Vaccines COVID-19, Flu, Pneumonia, RSV, Shingles, Tetanus/Pertussis/Diphtheria  [Encouraged]  Doctor Visits Discussed/Reviewed Doctor Visits Discussed, Doctor Visits Reviewed, Annual Wellness Visits, PCP, Specialist  [Encouraged]  Health Screening Bone Density, Colonoscopy, Mammogram  [Encouraged]  Durable Medical Equipment (DME) Dan Humphreys, Psychologist, forensic  PCP/Specialist Visits Compliance with follow-up visit  [Encouraged]  Communication with PCP/Specialists, RN  Level of Care Adult Daycare, Air traffic controller, Assisted Living, Personal Care Human resources officer Medicaid, Personal Care Services, [place]-2, Other  [RCATS Application]  Exercise Interventions   Exercise Discussed/Reviewed Exercise Discussed, Assistive device use and maintanence, Exercise Reviewed, Physical Activity, Weight Managment  [Encouraged]  Physical Activity Discussed/Reviewed Physical Activity Discussed, Home Exercise Program (HEP), Physical Activity Reviewed, Types of exercise   [Encouraged]  Weight Management Weight maintenance  [Encouraged]  Education Interventions   Education Provided Provided Therapist, sports, Provided Web-based Education, Provided Education  Provided Verbal Education On Nutrition, Mental Health/Coping with Illness, Applications, Exercise, Medication, Development worker, community, [employer], When to see the Scientist, research (physical sciences) Medicaid, Personal Care Services, [place], Other  [RCATS Application]  Mental Health Interventions   Mental Health Discussed/Reviewed Mental Health Discussed, Anxiety, Depression, Grief and Loss, Substance Abuse, Suicide, Crisis, Coping Strategies, Mental Health Reviewed  Nutrition Interventions   Nutrition Discussed/Reviewed Nutrition Discussed, Nutrition Reviewed, Adding fruits and vegetables, Fluid intake, Portion sizes, Decreasing sugar intake, Supplmental nutrition, Decreasing salt, Decreasing fats, Increaing proteins  [Encouraged]  Pharmacy Interventions   Pharmacy Dicussed/Reviewed Pharmacy Topics Discussed, Medication Adherence, Pharmacy Topics Reviewed, Affording Medications  [Encouraged]  Safety Interventions   Safety Discussed/Reviewed Safety Discussed, Safety Reviewed, Fall Risk, Home Safety  [Encouraged]  Home Safety Assistive Devices, Need for home safety assessment, Refer for community resources  Advanced Directive Interventions   Advanced Directives Discussed/Reviewed Advanced Directives Discussed, Advanced Directives Reviewed  [Completion Encouraged]     Active Listening & Reflection Utilized.  Verbalization of Feelings Encouraged.  Emotional Support Provided. Caregiver Stress Acknowledged. Caregiver Resources Reviewed. Self-Enrollment in Caregiver Support Group of Interest Emphasized. Problem Solving Interventions Activated. Task-Centered Solutions Employed.   Solution-Focused Strategies Implemented. Acceptance & Commitment Therapy Indicated. Cognitive Behavioral Therapy Initiated. Client-Centered  Therapy Performed. Confirmed Continued Involvement with Home Health Physical & Occupational Therapists, through [facility] [phone]).   Confirmed Transportation Assistance to Dialysis Treatments on Tuesday's, Thursday's & Saturday's, through (RCATS) [facility] [employer] [phone]), with ADTS (Aging, Disability & Transit Services of [employer] [phone]). Encouraged Submission of Completed, Signed & Notarized Advance Directives (Living Will & Healthcare Power of Attorney Documents) to Primary Care Provider, Dr. Donzetta Sprung with [facility] (#  [phone]), to Scan Into Electronic Medical Record in Epic. Please Contact CSW Directly (# [phone]), if You Have Questions, Need Assistance, or If Additional Social Work Needs Are Identified in The Near Future.      Please call the care guide team at [phone] if you need to cancel or reschedule your appointment.   If you are experiencing a Mental Health or Behavioral Health Crisis or need someone to talk to, please call the Suicide and Crisis Lifeline: 988 call the [place] National Suicide Prevention Lifeline: (406) 607-9135 or TTY: 240-436-0719 TTY (279)837-6185) to talk to a trained counselor call 1-800-273-TALK (toll free, 24 hour hotline) go to [facility] [address] [place] [phone]) call the [facility] Crisis Line: [phone] call 911  Patient verbalizes understanding of instructions and care plan provided today and agrees to view in MyChart. Active MyChart status and patient understanding of how to access instructions and care plan via MyChart confirmed with patient.     No further follow up required.  Danford Bad, BSW, MSW, LCSW  Licensed Restaurant manager, fast food [facility]  Mailing [place] [address] [place], [place]  [address] Physical Address[address] [place], [place] [address] Toll Free Main # [phone] Fax # [phone] Cell # [phone] [email or web address]

## 2022-09-29 DIAGNOSIS — E785 Hyperlipidemia, unspecified: Secondary | ICD-10-CM | POA: Diagnosis not present

## 2022-09-29 DIAGNOSIS — Z6820 Body mass index (BMI) 20.0-20.9, adult: Secondary | ICD-10-CM | POA: Diagnosis not present

## 2022-09-29 DIAGNOSIS — J441 Chronic obstructive pulmonary disease with (acute) exacerbation: Secondary | ICD-10-CM | POA: Diagnosis not present

## 2022-09-29 DIAGNOSIS — M069 Rheumatoid arthritis, unspecified: Secondary | ICD-10-CM | POA: Diagnosis not present

## 2022-09-29 DIAGNOSIS — Z87891 Personal history of nicotine dependence: Secondary | ICD-10-CM | POA: Diagnosis not present

## 2022-09-29 DIAGNOSIS — L89312 Pressure ulcer of right buttock, stage 2: Secondary | ICD-10-CM | POA: Diagnosis not present

## 2022-09-29 DIAGNOSIS — I509 Heart failure, unspecified: Secondary | ICD-10-CM | POA: Diagnosis not present

## 2022-09-29 DIAGNOSIS — I110 Hypertensive heart disease with heart failure: Secondary | ICD-10-CM | POA: Diagnosis not present

## 2022-09-29 DIAGNOSIS — Z7952 Long term (current) use of systemic steroids: Secondary | ICD-10-CM | POA: Diagnosis not present

## 2022-09-29 DIAGNOSIS — J440 Chronic obstructive pulmonary disease with acute lower respiratory infection: Secondary | ICD-10-CM | POA: Diagnosis not present

## 2022-09-29 DIAGNOSIS — N051 Unspecified nephritic syndrome with focal and segmental glomerular lesions: Secondary | ICD-10-CM | POA: Diagnosis not present

## 2022-09-29 DIAGNOSIS — N186 End stage renal disease: Secondary | ICD-10-CM | POA: Diagnosis not present

## 2022-09-29 DIAGNOSIS — Z8711 Personal history of peptic ulcer disease: Secondary | ICD-10-CM | POA: Diagnosis not present

## 2022-09-29 DIAGNOSIS — E46 Unspecified protein-calorie malnutrition: Secondary | ICD-10-CM | POA: Diagnosis not present

## 2022-09-29 DIAGNOSIS — Z992 Dependence on renal dialysis: Secondary | ICD-10-CM | POA: Diagnosis not present

## 2022-09-29 DIAGNOSIS — D631 Anemia in chronic kidney disease: Secondary | ICD-10-CM | POA: Diagnosis not present

## 2022-09-29 DIAGNOSIS — Z87440 Personal history of urinary (tract) infections: Secondary | ICD-10-CM | POA: Diagnosis not present

## 2022-09-30 DIAGNOSIS — Z992 Dependence on renal dialysis: Secondary | ICD-10-CM | POA: Diagnosis not present

## 2022-09-30 DIAGNOSIS — N186 End stage renal disease: Secondary | ICD-10-CM | POA: Diagnosis not present

## 2022-10-01 DIAGNOSIS — Z7952 Long term (current) use of systemic steroids: Secondary | ICD-10-CM | POA: Diagnosis not present

## 2022-10-01 DIAGNOSIS — Z87440 Personal history of urinary (tract) infections: Secondary | ICD-10-CM | POA: Diagnosis not present

## 2022-10-01 DIAGNOSIS — N186 End stage renal disease: Secondary | ICD-10-CM | POA: Diagnosis not present

## 2022-10-01 DIAGNOSIS — N051 Unspecified nephritic syndrome with focal and segmental glomerular lesions: Secondary | ICD-10-CM | POA: Diagnosis not present

## 2022-10-01 DIAGNOSIS — I110 Hypertensive heart disease with heart failure: Secondary | ICD-10-CM | POA: Diagnosis not present

## 2022-10-01 DIAGNOSIS — I10 Essential (primary) hypertension: Secondary | ICD-10-CM | POA: Diagnosis not present

## 2022-10-01 DIAGNOSIS — E46 Unspecified protein-calorie malnutrition: Secondary | ICD-10-CM | POA: Diagnosis not present

## 2022-10-01 DIAGNOSIS — M069 Rheumatoid arthritis, unspecified: Secondary | ICD-10-CM | POA: Diagnosis not present

## 2022-10-01 DIAGNOSIS — J440 Chronic obstructive pulmonary disease with acute lower respiratory infection: Secondary | ICD-10-CM | POA: Diagnosis not present

## 2022-10-01 DIAGNOSIS — Z992 Dependence on renal dialysis: Secondary | ICD-10-CM | POA: Diagnosis not present

## 2022-10-01 DIAGNOSIS — Z6820 Body mass index (BMI) 20.0-20.9, adult: Secondary | ICD-10-CM | POA: Diagnosis not present

## 2022-10-01 DIAGNOSIS — Z8711 Personal history of peptic ulcer disease: Secondary | ICD-10-CM | POA: Diagnosis not present

## 2022-10-01 DIAGNOSIS — J449 Chronic obstructive pulmonary disease, unspecified: Secondary | ICD-10-CM | POA: Diagnosis not present

## 2022-10-01 DIAGNOSIS — E782 Mixed hyperlipidemia: Secondary | ICD-10-CM | POA: Diagnosis not present

## 2022-10-01 DIAGNOSIS — D631 Anemia in chronic kidney disease: Secondary | ICD-10-CM | POA: Diagnosis not present

## 2022-10-01 DIAGNOSIS — J441 Chronic obstructive pulmonary disease with (acute) exacerbation: Secondary | ICD-10-CM | POA: Diagnosis not present

## 2022-10-01 DIAGNOSIS — Z87891 Personal history of nicotine dependence: Secondary | ICD-10-CM | POA: Diagnosis not present

## 2022-10-01 DIAGNOSIS — I509 Heart failure, unspecified: Secondary | ICD-10-CM | POA: Diagnosis not present

## 2022-10-01 DIAGNOSIS — L89312 Pressure ulcer of right buttock, stage 2: Secondary | ICD-10-CM | POA: Diagnosis not present

## 2022-10-01 DIAGNOSIS — E785 Hyperlipidemia, unspecified: Secondary | ICD-10-CM | POA: Diagnosis not present

## 2022-10-02 DIAGNOSIS — N186 End stage renal disease: Secondary | ICD-10-CM | POA: Diagnosis not present

## 2022-10-02 DIAGNOSIS — Z992 Dependence on renal dialysis: Secondary | ICD-10-CM | POA: Diagnosis not present

## 2022-10-02 DIAGNOSIS — D72829 Elevated white blood cell count, unspecified: Secondary | ICD-10-CM | POA: Diagnosis not present

## 2022-10-04 DIAGNOSIS — I110 Hypertensive heart disease with heart failure: Secondary | ICD-10-CM | POA: Diagnosis not present

## 2022-10-04 DIAGNOSIS — Z8711 Personal history of peptic ulcer disease: Secondary | ICD-10-CM | POA: Diagnosis not present

## 2022-10-04 DIAGNOSIS — J440 Chronic obstructive pulmonary disease with acute lower respiratory infection: Secondary | ICD-10-CM | POA: Diagnosis not present

## 2022-10-04 DIAGNOSIS — I509 Heart failure, unspecified: Secondary | ICD-10-CM | POA: Diagnosis not present

## 2022-10-04 DIAGNOSIS — D631 Anemia in chronic kidney disease: Secondary | ICD-10-CM | POA: Diagnosis not present

## 2022-10-04 DIAGNOSIS — E785 Hyperlipidemia, unspecified: Secondary | ICD-10-CM | POA: Diagnosis not present

## 2022-10-04 DIAGNOSIS — N051 Unspecified nephritic syndrome with focal and segmental glomerular lesions: Secondary | ICD-10-CM | POA: Diagnosis not present

## 2022-10-04 DIAGNOSIS — N186 End stage renal disease: Secondary | ICD-10-CM | POA: Diagnosis not present

## 2022-10-04 DIAGNOSIS — Z87891 Personal history of nicotine dependence: Secondary | ICD-10-CM | POA: Diagnosis not present

## 2022-10-04 DIAGNOSIS — Z87440 Personal history of urinary (tract) infections: Secondary | ICD-10-CM | POA: Diagnosis not present

## 2022-10-04 DIAGNOSIS — Z992 Dependence on renal dialysis: Secondary | ICD-10-CM | POA: Diagnosis not present

## 2022-10-04 DIAGNOSIS — J441 Chronic obstructive pulmonary disease with (acute) exacerbation: Secondary | ICD-10-CM | POA: Diagnosis not present

## 2022-10-04 DIAGNOSIS — Z6820 Body mass index (BMI) 20.0-20.9, adult: Secondary | ICD-10-CM | POA: Diagnosis not present

## 2022-10-04 DIAGNOSIS — Z7952 Long term (current) use of systemic steroids: Secondary | ICD-10-CM | POA: Diagnosis not present

## 2022-10-04 DIAGNOSIS — E46 Unspecified protein-calorie malnutrition: Secondary | ICD-10-CM | POA: Diagnosis not present

## 2022-10-04 DIAGNOSIS — M069 Rheumatoid arthritis, unspecified: Secondary | ICD-10-CM | POA: Diagnosis not present

## 2022-10-04 DIAGNOSIS — L89312 Pressure ulcer of right buttock, stage 2: Secondary | ICD-10-CM | POA: Diagnosis not present

## 2022-10-05 DIAGNOSIS — N186 End stage renal disease: Secondary | ICD-10-CM | POA: Diagnosis not present

## 2022-10-05 DIAGNOSIS — Z992 Dependence on renal dialysis: Secondary | ICD-10-CM | POA: Diagnosis not present

## 2022-10-05 DIAGNOSIS — D72829 Elevated white blood cell count, unspecified: Secondary | ICD-10-CM | POA: Diagnosis not present

## 2022-10-06 DIAGNOSIS — Z6820 Body mass index (BMI) 20.0-20.9, adult: Secondary | ICD-10-CM | POA: Diagnosis not present

## 2022-10-06 DIAGNOSIS — M069 Rheumatoid arthritis, unspecified: Secondary | ICD-10-CM | POA: Diagnosis not present

## 2022-10-06 DIAGNOSIS — N051 Unspecified nephritic syndrome with focal and segmental glomerular lesions: Secondary | ICD-10-CM | POA: Diagnosis not present

## 2022-10-06 DIAGNOSIS — I509 Heart failure, unspecified: Secondary | ICD-10-CM | POA: Diagnosis not present

## 2022-10-06 DIAGNOSIS — E785 Hyperlipidemia, unspecified: Secondary | ICD-10-CM | POA: Diagnosis not present

## 2022-10-06 DIAGNOSIS — Z87891 Personal history of nicotine dependence: Secondary | ICD-10-CM | POA: Diagnosis not present

## 2022-10-06 DIAGNOSIS — Z8711 Personal history of peptic ulcer disease: Secondary | ICD-10-CM | POA: Diagnosis not present

## 2022-10-06 DIAGNOSIS — I110 Hypertensive heart disease with heart failure: Secondary | ICD-10-CM | POA: Diagnosis not present

## 2022-10-06 DIAGNOSIS — J440 Chronic obstructive pulmonary disease with acute lower respiratory infection: Secondary | ICD-10-CM | POA: Diagnosis not present

## 2022-10-06 DIAGNOSIS — J449 Chronic obstructive pulmonary disease, unspecified: Secondary | ICD-10-CM | POA: Diagnosis not present

## 2022-10-06 DIAGNOSIS — Z87440 Personal history of urinary (tract) infections: Secondary | ICD-10-CM | POA: Diagnosis not present

## 2022-10-06 DIAGNOSIS — D631 Anemia in chronic kidney disease: Secondary | ICD-10-CM | POA: Diagnosis not present

## 2022-10-06 DIAGNOSIS — M0579 Rheumatoid arthritis with rheumatoid factor of multiple sites without organ or systems involvement: Secondary | ICD-10-CM | POA: Diagnosis not present

## 2022-10-06 DIAGNOSIS — N186 End stage renal disease: Secondary | ICD-10-CM | POA: Diagnosis not present

## 2022-10-06 DIAGNOSIS — Z992 Dependence on renal dialysis: Secondary | ICD-10-CM | POA: Diagnosis not present

## 2022-10-06 DIAGNOSIS — J441 Chronic obstructive pulmonary disease with (acute) exacerbation: Secondary | ICD-10-CM | POA: Diagnosis not present

## 2022-10-06 DIAGNOSIS — Z7952 Long term (current) use of systemic steroids: Secondary | ICD-10-CM | POA: Diagnosis not present

## 2022-10-06 DIAGNOSIS — L89312 Pressure ulcer of right buttock, stage 2: Secondary | ICD-10-CM | POA: Diagnosis not present

## 2022-10-06 DIAGNOSIS — E46 Unspecified protein-calorie malnutrition: Secondary | ICD-10-CM | POA: Diagnosis not present

## 2022-10-07 DIAGNOSIS — D72829 Elevated white blood cell count, unspecified: Secondary | ICD-10-CM | POA: Diagnosis not present

## 2022-10-07 DIAGNOSIS — Z992 Dependence on renal dialysis: Secondary | ICD-10-CM | POA: Diagnosis not present

## 2022-10-07 DIAGNOSIS — N186 End stage renal disease: Secondary | ICD-10-CM | POA: Diagnosis not present

## 2022-10-08 DIAGNOSIS — Z992 Dependence on renal dialysis: Secondary | ICD-10-CM | POA: Diagnosis not present

## 2022-10-08 DIAGNOSIS — D649 Anemia, unspecified: Secondary | ICD-10-CM | POA: Diagnosis not present

## 2022-10-08 DIAGNOSIS — D0512 Intraductal carcinoma in situ of left breast: Secondary | ICD-10-CM | POA: Diagnosis not present

## 2022-10-08 DIAGNOSIS — I10 Essential (primary) hypertension: Secondary | ICD-10-CM | POA: Diagnosis not present

## 2022-10-08 DIAGNOSIS — M47812 Spondylosis without myelopathy or radiculopathy, cervical region: Secondary | ICD-10-CM | POA: Diagnosis not present

## 2022-10-08 DIAGNOSIS — N186 End stage renal disease: Secondary | ICD-10-CM | POA: Diagnosis not present

## 2022-10-08 DIAGNOSIS — I700 Atherosclerosis of aorta: Secondary | ICD-10-CM | POA: Diagnosis not present

## 2022-10-08 DIAGNOSIS — E7849 Other hyperlipidemia: Secondary | ICD-10-CM | POA: Diagnosis not present

## 2022-10-08 DIAGNOSIS — J449 Chronic obstructive pulmonary disease, unspecified: Secondary | ICD-10-CM | POA: Diagnosis not present

## 2022-10-08 DIAGNOSIS — M069 Rheumatoid arthritis, unspecified: Secondary | ICD-10-CM | POA: Diagnosis not present

## 2022-10-08 DIAGNOSIS — N281 Cyst of kidney, acquired: Secondary | ICD-10-CM | POA: Diagnosis not present

## 2022-10-09 DIAGNOSIS — D72829 Elevated white blood cell count, unspecified: Secondary | ICD-10-CM | POA: Diagnosis not present

## 2022-10-09 DIAGNOSIS — N186 End stage renal disease: Secondary | ICD-10-CM | POA: Diagnosis not present

## 2022-10-09 DIAGNOSIS — Z992 Dependence on renal dialysis: Secondary | ICD-10-CM | POA: Diagnosis not present

## 2022-10-11 DIAGNOSIS — Z7952 Long term (current) use of systemic steroids: Secondary | ICD-10-CM | POA: Diagnosis not present

## 2022-10-11 DIAGNOSIS — N051 Unspecified nephritic syndrome with focal and segmental glomerular lesions: Secondary | ICD-10-CM | POA: Diagnosis not present

## 2022-10-11 DIAGNOSIS — Z992 Dependence on renal dialysis: Secondary | ICD-10-CM | POA: Diagnosis not present

## 2022-10-11 DIAGNOSIS — Z8711 Personal history of peptic ulcer disease: Secondary | ICD-10-CM | POA: Diagnosis not present

## 2022-10-11 DIAGNOSIS — J440 Chronic obstructive pulmonary disease with acute lower respiratory infection: Secondary | ICD-10-CM | POA: Diagnosis not present

## 2022-10-11 DIAGNOSIS — Z87440 Personal history of urinary (tract) infections: Secondary | ICD-10-CM | POA: Diagnosis not present

## 2022-10-11 DIAGNOSIS — I110 Hypertensive heart disease with heart failure: Secondary | ICD-10-CM | POA: Diagnosis not present

## 2022-10-11 DIAGNOSIS — J441 Chronic obstructive pulmonary disease with (acute) exacerbation: Secondary | ICD-10-CM | POA: Diagnosis not present

## 2022-10-11 DIAGNOSIS — D631 Anemia in chronic kidney disease: Secondary | ICD-10-CM | POA: Diagnosis not present

## 2022-10-11 DIAGNOSIS — E46 Unspecified protein-calorie malnutrition: Secondary | ICD-10-CM | POA: Diagnosis not present

## 2022-10-11 DIAGNOSIS — E785 Hyperlipidemia, unspecified: Secondary | ICD-10-CM | POA: Diagnosis not present

## 2022-10-11 DIAGNOSIS — Z87891 Personal history of nicotine dependence: Secondary | ICD-10-CM | POA: Diagnosis not present

## 2022-10-11 DIAGNOSIS — I509 Heart failure, unspecified: Secondary | ICD-10-CM | POA: Diagnosis not present

## 2022-10-11 DIAGNOSIS — M069 Rheumatoid arthritis, unspecified: Secondary | ICD-10-CM | POA: Diagnosis not present

## 2022-10-11 DIAGNOSIS — N186 End stage renal disease: Secondary | ICD-10-CM | POA: Diagnosis not present

## 2022-10-11 DIAGNOSIS — L89312 Pressure ulcer of right buttock, stage 2: Secondary | ICD-10-CM | POA: Diagnosis not present

## 2022-10-11 DIAGNOSIS — Z6820 Body mass index (BMI) 20.0-20.9, adult: Secondary | ICD-10-CM | POA: Diagnosis not present

## 2022-10-12 DIAGNOSIS — N186 End stage renal disease: Secondary | ICD-10-CM | POA: Diagnosis not present

## 2022-10-12 DIAGNOSIS — Z992 Dependence on renal dialysis: Secondary | ICD-10-CM | POA: Diagnosis not present

## 2022-10-12 DIAGNOSIS — D72829 Elevated white blood cell count, unspecified: Secondary | ICD-10-CM | POA: Diagnosis not present

## 2022-10-13 DIAGNOSIS — E785 Hyperlipidemia, unspecified: Secondary | ICD-10-CM | POA: Diagnosis not present

## 2022-10-13 DIAGNOSIS — N051 Unspecified nephritic syndrome with focal and segmental glomerular lesions: Secondary | ICD-10-CM | POA: Diagnosis not present

## 2022-10-13 DIAGNOSIS — L89312 Pressure ulcer of right buttock, stage 2: Secondary | ICD-10-CM | POA: Diagnosis not present

## 2022-10-13 DIAGNOSIS — Z87440 Personal history of urinary (tract) infections: Secondary | ICD-10-CM | POA: Diagnosis not present

## 2022-10-13 DIAGNOSIS — J440 Chronic obstructive pulmonary disease with acute lower respiratory infection: Secondary | ICD-10-CM | POA: Diagnosis not present

## 2022-10-13 DIAGNOSIS — E46 Unspecified protein-calorie malnutrition: Secondary | ICD-10-CM | POA: Diagnosis not present

## 2022-10-13 DIAGNOSIS — Z992 Dependence on renal dialysis: Secondary | ICD-10-CM | POA: Diagnosis not present

## 2022-10-13 DIAGNOSIS — Z8711 Personal history of peptic ulcer disease: Secondary | ICD-10-CM | POA: Diagnosis not present

## 2022-10-13 DIAGNOSIS — I110 Hypertensive heart disease with heart failure: Secondary | ICD-10-CM | POA: Diagnosis not present

## 2022-10-13 DIAGNOSIS — Z6820 Body mass index (BMI) 20.0-20.9, adult: Secondary | ICD-10-CM | POA: Diagnosis not present

## 2022-10-13 DIAGNOSIS — N186 End stage renal disease: Secondary | ICD-10-CM | POA: Diagnosis not present

## 2022-10-13 DIAGNOSIS — Z87891 Personal history of nicotine dependence: Secondary | ICD-10-CM | POA: Diagnosis not present

## 2022-10-13 DIAGNOSIS — Z7952 Long term (current) use of systemic steroids: Secondary | ICD-10-CM | POA: Diagnosis not present

## 2022-10-13 DIAGNOSIS — I509 Heart failure, unspecified: Secondary | ICD-10-CM | POA: Diagnosis not present

## 2022-10-13 DIAGNOSIS — J441 Chronic obstructive pulmonary disease with (acute) exacerbation: Secondary | ICD-10-CM | POA: Diagnosis not present

## 2022-10-13 DIAGNOSIS — D631 Anemia in chronic kidney disease: Secondary | ICD-10-CM | POA: Diagnosis not present

## 2022-10-13 DIAGNOSIS — M069 Rheumatoid arthritis, unspecified: Secondary | ICD-10-CM | POA: Diagnosis not present

## 2022-10-14 DIAGNOSIS — D72829 Elevated white blood cell count, unspecified: Secondary | ICD-10-CM | POA: Diagnosis not present

## 2022-10-14 DIAGNOSIS — N186 End stage renal disease: Secondary | ICD-10-CM | POA: Diagnosis not present

## 2022-10-14 DIAGNOSIS — Z992 Dependence on renal dialysis: Secondary | ICD-10-CM | POA: Diagnosis not present

## 2022-10-16 DIAGNOSIS — Z992 Dependence on renal dialysis: Secondary | ICD-10-CM | POA: Diagnosis not present

## 2022-10-16 DIAGNOSIS — D72829 Elevated white blood cell count, unspecified: Secondary | ICD-10-CM | POA: Diagnosis not present

## 2022-10-16 DIAGNOSIS — N186 End stage renal disease: Secondary | ICD-10-CM | POA: Diagnosis not present

## 2022-10-19 DIAGNOSIS — D72829 Elevated white blood cell count, unspecified: Secondary | ICD-10-CM | POA: Diagnosis not present

## 2022-10-19 DIAGNOSIS — N186 End stage renal disease: Secondary | ICD-10-CM | POA: Diagnosis not present

## 2022-10-19 DIAGNOSIS — Z992 Dependence on renal dialysis: Secondary | ICD-10-CM | POA: Diagnosis not present

## 2022-10-20 ENCOUNTER — Encounter: Payer: Medicare Other | Admitting: Vascular Surgery

## 2022-10-20 DIAGNOSIS — Z992 Dependence on renal dialysis: Secondary | ICD-10-CM | POA: Diagnosis not present

## 2022-10-20 DIAGNOSIS — I110 Hypertensive heart disease with heart failure: Secondary | ICD-10-CM | POA: Diagnosis not present

## 2022-10-20 DIAGNOSIS — M069 Rheumatoid arthritis, unspecified: Secondary | ICD-10-CM | POA: Diagnosis not present

## 2022-10-20 DIAGNOSIS — N051 Unspecified nephritic syndrome with focal and segmental glomerular lesions: Secondary | ICD-10-CM | POA: Diagnosis not present

## 2022-10-20 DIAGNOSIS — J441 Chronic obstructive pulmonary disease with (acute) exacerbation: Secondary | ICD-10-CM | POA: Diagnosis not present

## 2022-10-20 DIAGNOSIS — Z6820 Body mass index (BMI) 20.0-20.9, adult: Secondary | ICD-10-CM | POA: Diagnosis not present

## 2022-10-20 DIAGNOSIS — D631 Anemia in chronic kidney disease: Secondary | ICD-10-CM | POA: Diagnosis not present

## 2022-10-20 DIAGNOSIS — N186 End stage renal disease: Secondary | ICD-10-CM | POA: Diagnosis not present

## 2022-10-20 DIAGNOSIS — Z7952 Long term (current) use of systemic steroids: Secondary | ICD-10-CM | POA: Diagnosis not present

## 2022-10-20 DIAGNOSIS — E46 Unspecified protein-calorie malnutrition: Secondary | ICD-10-CM | POA: Diagnosis not present

## 2022-10-20 DIAGNOSIS — I509 Heart failure, unspecified: Secondary | ICD-10-CM | POA: Diagnosis not present

## 2022-10-20 DIAGNOSIS — L89312 Pressure ulcer of right buttock, stage 2: Secondary | ICD-10-CM | POA: Diagnosis not present

## 2022-10-20 DIAGNOSIS — Z87440 Personal history of urinary (tract) infections: Secondary | ICD-10-CM | POA: Diagnosis not present

## 2022-10-20 DIAGNOSIS — J440 Chronic obstructive pulmonary disease with acute lower respiratory infection: Secondary | ICD-10-CM | POA: Diagnosis not present

## 2022-10-20 DIAGNOSIS — E785 Hyperlipidemia, unspecified: Secondary | ICD-10-CM | POA: Diagnosis not present

## 2022-10-20 DIAGNOSIS — Z8711 Personal history of peptic ulcer disease: Secondary | ICD-10-CM | POA: Diagnosis not present

## 2022-10-20 DIAGNOSIS — Z87891 Personal history of nicotine dependence: Secondary | ICD-10-CM | POA: Diagnosis not present

## 2022-10-21 DIAGNOSIS — Z992 Dependence on renal dialysis: Secondary | ICD-10-CM | POA: Diagnosis not present

## 2022-10-21 DIAGNOSIS — N186 End stage renal disease: Secondary | ICD-10-CM | POA: Diagnosis not present

## 2022-10-21 DIAGNOSIS — D72829 Elevated white blood cell count, unspecified: Secondary | ICD-10-CM | POA: Diagnosis not present

## 2022-10-22 DIAGNOSIS — Z87440 Personal history of urinary (tract) infections: Secondary | ICD-10-CM | POA: Diagnosis not present

## 2022-10-22 DIAGNOSIS — J441 Chronic obstructive pulmonary disease with (acute) exacerbation: Secondary | ICD-10-CM | POA: Diagnosis not present

## 2022-10-22 DIAGNOSIS — J440 Chronic obstructive pulmonary disease with acute lower respiratory infection: Secondary | ICD-10-CM | POA: Diagnosis not present

## 2022-10-22 DIAGNOSIS — M069 Rheumatoid arthritis, unspecified: Secondary | ICD-10-CM | POA: Diagnosis not present

## 2022-10-22 DIAGNOSIS — Z8711 Personal history of peptic ulcer disease: Secondary | ICD-10-CM | POA: Diagnosis not present

## 2022-10-22 DIAGNOSIS — N051 Unspecified nephritic syndrome with focal and segmental glomerular lesions: Secondary | ICD-10-CM | POA: Diagnosis not present

## 2022-10-22 DIAGNOSIS — I110 Hypertensive heart disease with heart failure: Secondary | ICD-10-CM | POA: Diagnosis not present

## 2022-10-22 DIAGNOSIS — N186 End stage renal disease: Secondary | ICD-10-CM | POA: Diagnosis not present

## 2022-10-22 DIAGNOSIS — Z7952 Long term (current) use of systemic steroids: Secondary | ICD-10-CM | POA: Diagnosis not present

## 2022-10-22 DIAGNOSIS — I509 Heart failure, unspecified: Secondary | ICD-10-CM | POA: Diagnosis not present

## 2022-10-22 DIAGNOSIS — E46 Unspecified protein-calorie malnutrition: Secondary | ICD-10-CM | POA: Diagnosis not present

## 2022-10-22 DIAGNOSIS — L89312 Pressure ulcer of right buttock, stage 2: Secondary | ICD-10-CM | POA: Diagnosis not present

## 2022-10-22 DIAGNOSIS — Z87891 Personal history of nicotine dependence: Secondary | ICD-10-CM | POA: Diagnosis not present

## 2022-10-22 DIAGNOSIS — Z6820 Body mass index (BMI) 20.0-20.9, adult: Secondary | ICD-10-CM | POA: Diagnosis not present

## 2022-10-22 DIAGNOSIS — D631 Anemia in chronic kidney disease: Secondary | ICD-10-CM | POA: Diagnosis not present

## 2022-10-22 DIAGNOSIS — Z992 Dependence on renal dialysis: Secondary | ICD-10-CM | POA: Diagnosis not present

## 2022-10-22 DIAGNOSIS — E785 Hyperlipidemia, unspecified: Secondary | ICD-10-CM | POA: Diagnosis not present

## 2022-10-23 DIAGNOSIS — Z992 Dependence on renal dialysis: Secondary | ICD-10-CM | POA: Diagnosis not present

## 2022-10-23 DIAGNOSIS — D72829 Elevated white blood cell count, unspecified: Secondary | ICD-10-CM | POA: Diagnosis not present

## 2022-10-23 DIAGNOSIS — N186 End stage renal disease: Secondary | ICD-10-CM | POA: Diagnosis not present

## 2022-10-25 DIAGNOSIS — J441 Chronic obstructive pulmonary disease with (acute) exacerbation: Secondary | ICD-10-CM | POA: Diagnosis not present

## 2022-10-25 DIAGNOSIS — J440 Chronic obstructive pulmonary disease with acute lower respiratory infection: Secondary | ICD-10-CM | POA: Diagnosis not present

## 2022-10-25 DIAGNOSIS — M069 Rheumatoid arthritis, unspecified: Secondary | ICD-10-CM | POA: Diagnosis not present

## 2022-10-25 DIAGNOSIS — N051 Unspecified nephritic syndrome with focal and segmental glomerular lesions: Secondary | ICD-10-CM | POA: Diagnosis not present

## 2022-10-25 DIAGNOSIS — E785 Hyperlipidemia, unspecified: Secondary | ICD-10-CM | POA: Diagnosis not present

## 2022-10-25 DIAGNOSIS — I509 Heart failure, unspecified: Secondary | ICD-10-CM | POA: Diagnosis not present

## 2022-10-25 DIAGNOSIS — Z87891 Personal history of nicotine dependence: Secondary | ICD-10-CM | POA: Diagnosis not present

## 2022-10-25 DIAGNOSIS — Z87440 Personal history of urinary (tract) infections: Secondary | ICD-10-CM | POA: Diagnosis not present

## 2022-10-25 DIAGNOSIS — N186 End stage renal disease: Secondary | ICD-10-CM | POA: Diagnosis not present

## 2022-10-25 DIAGNOSIS — Z8711 Personal history of peptic ulcer disease: Secondary | ICD-10-CM | POA: Diagnosis not present

## 2022-10-25 DIAGNOSIS — Z7952 Long term (current) use of systemic steroids: Secondary | ICD-10-CM | POA: Diagnosis not present

## 2022-10-25 DIAGNOSIS — D631 Anemia in chronic kidney disease: Secondary | ICD-10-CM | POA: Diagnosis not present

## 2022-10-25 DIAGNOSIS — I110 Hypertensive heart disease with heart failure: Secondary | ICD-10-CM | POA: Diagnosis not present

## 2022-10-25 DIAGNOSIS — Z992 Dependence on renal dialysis: Secondary | ICD-10-CM | POA: Diagnosis not present

## 2022-10-25 DIAGNOSIS — Z6820 Body mass index (BMI) 20.0-20.9, adult: Secondary | ICD-10-CM | POA: Diagnosis not present

## 2022-10-25 DIAGNOSIS — E46 Unspecified protein-calorie malnutrition: Secondary | ICD-10-CM | POA: Diagnosis not present

## 2022-10-25 DIAGNOSIS — L89312 Pressure ulcer of right buttock, stage 2: Secondary | ICD-10-CM | POA: Diagnosis not present

## 2022-10-26 DIAGNOSIS — N186 End stage renal disease: Secondary | ICD-10-CM | POA: Diagnosis not present

## 2022-10-26 DIAGNOSIS — D72829 Elevated white blood cell count, unspecified: Secondary | ICD-10-CM | POA: Diagnosis not present

## 2022-10-26 DIAGNOSIS — Z992 Dependence on renal dialysis: Secondary | ICD-10-CM | POA: Diagnosis not present

## 2022-10-27 ENCOUNTER — Encounter: Payer: Medicare Other | Admitting: Vascular Surgery

## 2022-10-27 DIAGNOSIS — I110 Hypertensive heart disease with heart failure: Secondary | ICD-10-CM | POA: Diagnosis not present

## 2022-10-27 DIAGNOSIS — Z87891 Personal history of nicotine dependence: Secondary | ICD-10-CM | POA: Diagnosis not present

## 2022-10-27 DIAGNOSIS — N051 Unspecified nephritic syndrome with focal and segmental glomerular lesions: Secondary | ICD-10-CM | POA: Diagnosis not present

## 2022-10-27 DIAGNOSIS — Z7952 Long term (current) use of systemic steroids: Secondary | ICD-10-CM | POA: Diagnosis not present

## 2022-10-27 DIAGNOSIS — E785 Hyperlipidemia, unspecified: Secondary | ICD-10-CM | POA: Diagnosis not present

## 2022-10-27 DIAGNOSIS — J441 Chronic obstructive pulmonary disease with (acute) exacerbation: Secondary | ICD-10-CM | POA: Diagnosis not present

## 2022-10-27 DIAGNOSIS — L89312 Pressure ulcer of right buttock, stage 2: Secondary | ICD-10-CM | POA: Diagnosis not present

## 2022-10-27 DIAGNOSIS — J440 Chronic obstructive pulmonary disease with acute lower respiratory infection: Secondary | ICD-10-CM | POA: Diagnosis not present

## 2022-10-27 DIAGNOSIS — Z6820 Body mass index (BMI) 20.0-20.9, adult: Secondary | ICD-10-CM | POA: Diagnosis not present

## 2022-10-27 DIAGNOSIS — Z8711 Personal history of peptic ulcer disease: Secondary | ICD-10-CM | POA: Diagnosis not present

## 2022-10-27 DIAGNOSIS — Z87440 Personal history of urinary (tract) infections: Secondary | ICD-10-CM | POA: Diagnosis not present

## 2022-10-27 DIAGNOSIS — I509 Heart failure, unspecified: Secondary | ICD-10-CM | POA: Diagnosis not present

## 2022-10-27 DIAGNOSIS — Z992 Dependence on renal dialysis: Secondary | ICD-10-CM | POA: Diagnosis not present

## 2022-10-27 DIAGNOSIS — M069 Rheumatoid arthritis, unspecified: Secondary | ICD-10-CM | POA: Diagnosis not present

## 2022-10-27 DIAGNOSIS — E46 Unspecified protein-calorie malnutrition: Secondary | ICD-10-CM | POA: Diagnosis not present

## 2022-10-27 DIAGNOSIS — D631 Anemia in chronic kidney disease: Secondary | ICD-10-CM | POA: Diagnosis not present

## 2022-10-27 DIAGNOSIS — N186 End stage renal disease: Secondary | ICD-10-CM | POA: Diagnosis not present

## 2022-10-28 DIAGNOSIS — Z992 Dependence on renal dialysis: Secondary | ICD-10-CM | POA: Diagnosis not present

## 2022-10-28 DIAGNOSIS — D72829 Elevated white blood cell count, unspecified: Secondary | ICD-10-CM | POA: Diagnosis not present

## 2022-10-28 DIAGNOSIS — N186 End stage renal disease: Secondary | ICD-10-CM | POA: Diagnosis not present

## 2022-10-28 NOTE — Progress Notes (Signed)
Office Note     CC:  ESRD Requesting Provider:  Mady Haagensen, MD  HPI: Sheri Cooper is a Right handed 72 y.o. ([birth year]) female with kidney disease who presents at the request of Mady Haagensen, MD for permanent HD access. The patient has had no prior access procedures. Per pt, previous tunneled lines have been placed in right IJ. Current access is Right IJ. Dialysis days are TTS.   On exam, Sheri Cooper was doing well. A native of [facility], she has lived in several places, but several years ago moved back to take care of her mother who passed 2 years ago.  She has 2 sons, both retired from the [employer] and [employer].  She has had recent hospital discharge after kidney abscess resulted in need for dialysis, as well as midline laparotomy for duodenal ulcer perforation. Overall, she seems to be recovering well.   Past Medical History:  Diagnosis Date   Chronic kidney disease (CKD), stage IV (severe) (HCC)    COPD (chronic obstructive pulmonary disease) (HCC)    PUD (peptic ulcer disease) 05/2022   Rheumatoid arthritis (HCC)     Past Surgical History:  Procedure Laterality Date   CENTRAL VENOUS CATHETER INSERTION Left 05/27/2022   Procedure: INSERTION CENTRAL LINE ADULT;  Surgeon: Lucretia Roers, MD;  Location: [facility];  Service: General;  Laterality: Left;   COLONOSCOPY WITH PROPOFOL N/A 05/22/2022   Surgeon: Marguerita Merles, Reuel Boom, MD; blood in the terminal ileum and colon.   ESOPHAGOGASTRODUODENOSCOPY (EGD) WITH PROPOFOL N/A 05/22/2022   Surgeon: Marguerita Merles, Reuel Boom, MD; peptic ulcer disease with 2 nonbleeding cratered gastric ulcers, multiple duodenal ulcers, 2 of which had adherent clots that were removed and revealed 2 vessels.  1 vessel was clipped, cauterized, and treated with epinephrine injection.  Second vessel was clipped and treated with cautery, clip fell off.   IR FLUORO GUIDE CV LINE RIGHT  05/24/2022   IR US GUIDE VASC ACCESS RIGHT   05/24/2022   LAPAROTOMY N/A 05/27/2022   Procedure: EXPLORATORY LAPAROTOMY, OMENTAL PATH, DRAIN PLACEMENT;  Surgeon: Lucretia Roers, MD;  Location: [facility];  Service: General;  Laterality: N/A;   VOLVULUS REDUCTION     colon resection    Social History   Socioeconomic History   Marital status: Significant Other    Spouse name: Not on file   Number of children: Not on file   Years of education: 12   Highest education level: 12th grade  Occupational History   Not on file  Tobacco Use   Smoking status: Former    Types: Cigarettes    Passive exposure: Past   Smokeless tobacco: Never  Vaping Use   Vaping Use: Never used  Substance and Sexual Activity   Alcohol use: Not Currently   Drug use: Never   Sexual activity: Not Currently  Other Topics Concern   Not on file  Social History Narrative   Not on file   Social Determinants of Health   Financial Resource Strain: Low Risk  (08/02/2022)   Overall Financial Resource Strain (CARDIA)    Difficulty of Paying Living Expenses: Not hard at all  Food Insecurity: No Food Insecurity (09/07/2022)   Hunger Vital Sign    Worried About Running Out of Food in the Last Year: Never true    Ran Out of Food in the Last Year: Never true  Transportation Needs: Unmet Transportation Needs (09/07/2022)   PRAPARE - Administrator, Civil Service (Medical): Yes  Lack of Transportation (Non-Medical): No  Physical Activity: Inactive (08/02/2022)   Exercise Vital Sign    Days of Exercise per Week: 0 days    Minutes of Exercise per Session: 0 min  Stress: No Stress Concern Present (08/02/2022)   [employer] of Occupational Health - Occupational Stress Questionnaire    Feeling of Stress : Only a little  Social Connections: Unknown (08/02/2022)   Social Connection and Isolation Panel [NHANES]    Frequency of Communication with Friends and Family: More than three times a week    Frequency of Social Gatherings with Friends and Family: More  than three times a week    Attends Religious Services: Never    Database administrator or Organizations: No    Attends Banker Meetings: Never    Marital Status: Not on file  Intimate Partner Violence: Not At Risk (09/07/2022)   Humiliation, Afraid, Rape, and Kick questionnaire    Fear of Current or Ex-Partner: No    Emotionally Abused: No    Physically Abused: No    Sexually Abused: No   No family history on file.  Current Outpatient Medications  Medication Sig Dispense Refill   amLODipine (NORVASC) 10 MG tablet Take 10 mg by mouth daily.     carvedilol (COREG) 25 MG tablet Take 25 mg by mouth 2 (two) times daily with a meal.     furosemide (LASIX) 40 MG tablet Take 1 tablet (40 mg total) by mouth 2 (two) times daily.     hydrALAZINE (APRESOLINE) 50 MG tablet Take 50 mg by mouth 2 (two) times daily.     metoprolol succinate (TOPROL-XL) 50 MG 24 hr tablet Take 0.5 tablets (25 mg total) by mouth daily.     metoprolol-hydrochlorothiazide (LOPRESSOR HCT) 100-50 MG tablet Take 1 tablet by mouth daily.     nystatin (MYCOSTATIN/NYSTOP) powder Apply 1 Application topically 3 (three) times daily.     oxyCODONE (ROXICODONE) 5 MG immediate release tablet Take 1-2 tablets (5-10 mg total) by mouth every 8 (eight) hours as needed for severe pain. 20 tablet 0   pantoprazole (PROTONIX) 40 MG tablet Take 1 tablet (40 mg total) by mouth 2 (two) times daily. 60 tablet 1   rosuvastatin (CRESTOR) 20 MG tablet Take 20 mg by mouth daily.     sertraline (ZOLOFT) 50 MG tablet Take 1 tablet by mouth daily.     No current facility-administered medications for this visit.    No Known Allergies   REVIEW OF SYSTEMS:   [X]  denotes positive finding, [ ]  denotes negative finding Cardiac  Comments:  Chest pain or chest pressure:    Shortness of breath upon exertion:    Short of breath when lying flat:    Irregular heart rhythm:        Vascular    Pain in calf, thigh, or hip brought on by  ambulation:    Pain in feet at night that wakes you up from your sleep:     Blood clot in your veins:    Leg swelling:         Pulmonary    Oxygen at home:    Productive cough:     Wheezing:         Neurologic    Sudden weakness in arms or legs:     Sudden numbness in arms or legs:     Sudden onset of difficulty speaking or slurred speech:    Temporary loss of vision in one eye:  Problems with dizziness:         Gastrointestinal    Blood in stool:     Vomited blood:         Genitourinary    Burning when urinating:     Blood in urine:        Psychiatric    Major depression:         Hematologic    Bleeding problems:    Problems with blood clotting too easily:        Skin    Rashes or ulcers:        Constitutional    Fever or chills:      PHYSICAL EXAMINATION:  There were no vitals filed for this visit.  General:  WDWN in NAD; vital signs documented above Gait: Not observed HENT: WNL, normocephalic Pulmonary: normal non-labored breathing , without Rales, rhonchi,  wheezing Cardiac: regular HR, Abdomen: soft, NT, no masses Skin: without rashes Vascular Exam/Pulses:  Right Left  Radial 2+ (normal) 2+ (normal)                       Extremities: without ischemic changes, without Gangrene , without cellulitis; without open wounds;  Musculoskeletal: no muscle wasting or atrophy  Neurologic: A&O X 3;  No focal weakness or paresthesias are detected Psychiatric:  The pt has Normal affect.   Non-Invasive Vascular Imaging:   none    ASSESSMENT/PLAN:  Sheri Cooper is a 72 y.o. female who presents with end stage renal disease in need of long-term HD access.  See was hesitant to follow-up with our group due to her concern for post cannulation bleeding.  She stated she has seen significant bleeding at her dialysis unit which makes her nervous to have HD from a fistula or AV graft.  She did not have vein mapping completed today.  I placed a probe  on her arms, however had poor visualization of the cephalic veins.  In an effort to offer all possible options, I have ordered formal vein mapping of the arteries and veins in bilateral upper extremities.  My plan is to call her with these results to discuss surgery moving forward.  She was honest that she may continue using her tunneled HD line rather than pursuing fistula versus graft.   Victorino Sparrow, MD [facility] [phone]

## 2022-10-29 ENCOUNTER — Ambulatory Visit (INDEPENDENT_AMBULATORY_CARE_PROVIDER_SITE_OTHER): Payer: Medicare Other | Admitting: Vascular Surgery

## 2022-10-29 ENCOUNTER — Encounter: Payer: Self-pay | Admitting: Vascular Surgery

## 2022-10-29 VITALS — BP 173/65 | HR 75 | Temp 98.4°F | Resp 20 | Ht 59.0 in | Wt 89.0 lb

## 2022-10-29 DIAGNOSIS — N186 End stage renal disease: Secondary | ICD-10-CM

## 2022-10-30 DIAGNOSIS — N186 End stage renal disease: Secondary | ICD-10-CM | POA: Diagnosis not present

## 2022-10-30 DIAGNOSIS — D72829 Elevated white blood cell count, unspecified: Secondary | ICD-10-CM | POA: Diagnosis not present

## 2022-10-30 DIAGNOSIS — Z992 Dependence on renal dialysis: Secondary | ICD-10-CM | POA: Diagnosis not present

## 2022-10-31 DIAGNOSIS — Z992 Dependence on renal dialysis: Secondary | ICD-10-CM | POA: Diagnosis not present

## 2022-10-31 DIAGNOSIS — N186 End stage renal disease: Secondary | ICD-10-CM | POA: Diagnosis not present

## 2022-11-01 DIAGNOSIS — Z8711 Personal history of peptic ulcer disease: Secondary | ICD-10-CM | POA: Diagnosis not present

## 2022-11-01 DIAGNOSIS — J441 Chronic obstructive pulmonary disease with (acute) exacerbation: Secondary | ICD-10-CM | POA: Diagnosis not present

## 2022-11-01 DIAGNOSIS — I509 Heart failure, unspecified: Secondary | ICD-10-CM | POA: Diagnosis not present

## 2022-11-01 DIAGNOSIS — E46 Unspecified protein-calorie malnutrition: Secondary | ICD-10-CM | POA: Diagnosis not present

## 2022-11-01 DIAGNOSIS — N186 End stage renal disease: Secondary | ICD-10-CM | POA: Diagnosis not present

## 2022-11-01 DIAGNOSIS — Z87440 Personal history of urinary (tract) infections: Secondary | ICD-10-CM | POA: Diagnosis not present

## 2022-11-01 DIAGNOSIS — Z992 Dependence on renal dialysis: Secondary | ICD-10-CM | POA: Diagnosis not present

## 2022-11-01 DIAGNOSIS — N051 Unspecified nephritic syndrome with focal and segmental glomerular lesions: Secondary | ICD-10-CM | POA: Diagnosis not present

## 2022-11-01 DIAGNOSIS — M069 Rheumatoid arthritis, unspecified: Secondary | ICD-10-CM | POA: Diagnosis not present

## 2022-11-01 DIAGNOSIS — Z7952 Long term (current) use of systemic steroids: Secondary | ICD-10-CM | POA: Diagnosis not present

## 2022-11-01 DIAGNOSIS — Z6820 Body mass index (BMI) 20.0-20.9, adult: Secondary | ICD-10-CM | POA: Diagnosis not present

## 2022-11-01 DIAGNOSIS — I110 Hypertensive heart disease with heart failure: Secondary | ICD-10-CM | POA: Diagnosis not present

## 2022-11-01 DIAGNOSIS — J440 Chronic obstructive pulmonary disease with acute lower respiratory infection: Secondary | ICD-10-CM | POA: Diagnosis not present

## 2022-11-01 DIAGNOSIS — Z87891 Personal history of nicotine dependence: Secondary | ICD-10-CM | POA: Diagnosis not present

## 2022-11-01 DIAGNOSIS — D631 Anemia in chronic kidney disease: Secondary | ICD-10-CM | POA: Diagnosis not present

## 2022-11-01 DIAGNOSIS — E785 Hyperlipidemia, unspecified: Secondary | ICD-10-CM | POA: Diagnosis not present

## 2022-11-01 DIAGNOSIS — L89312 Pressure ulcer of right buttock, stage 2: Secondary | ICD-10-CM | POA: Diagnosis not present

## 2022-11-02 ENCOUNTER — Other Ambulatory Visit: Payer: Self-pay | Admitting: *Deleted

## 2022-11-02 DIAGNOSIS — Z992 Dependence on renal dialysis: Secondary | ICD-10-CM | POA: Diagnosis not present

## 2022-11-02 DIAGNOSIS — N186 End stage renal disease: Secondary | ICD-10-CM

## 2022-11-03 DIAGNOSIS — Z992 Dependence on renal dialysis: Secondary | ICD-10-CM | POA: Diagnosis not present

## 2022-11-03 DIAGNOSIS — Z87891 Personal history of nicotine dependence: Secondary | ICD-10-CM | POA: Diagnosis not present

## 2022-11-03 DIAGNOSIS — N051 Unspecified nephritic syndrome with focal and segmental glomerular lesions: Secondary | ICD-10-CM | POA: Diagnosis not present

## 2022-11-03 DIAGNOSIS — I110 Hypertensive heart disease with heart failure: Secondary | ICD-10-CM | POA: Diagnosis not present

## 2022-11-03 DIAGNOSIS — Z7952 Long term (current) use of systemic steroids: Secondary | ICD-10-CM | POA: Diagnosis not present

## 2022-11-03 DIAGNOSIS — E46 Unspecified protein-calorie malnutrition: Secondary | ICD-10-CM | POA: Diagnosis not present

## 2022-11-03 DIAGNOSIS — N186 End stage renal disease: Secondary | ICD-10-CM | POA: Diagnosis not present

## 2022-11-03 DIAGNOSIS — Z8711 Personal history of peptic ulcer disease: Secondary | ICD-10-CM | POA: Diagnosis not present

## 2022-11-03 DIAGNOSIS — J440 Chronic obstructive pulmonary disease with acute lower respiratory infection: Secondary | ICD-10-CM | POA: Diagnosis not present

## 2022-11-03 DIAGNOSIS — J441 Chronic obstructive pulmonary disease with (acute) exacerbation: Secondary | ICD-10-CM | POA: Diagnosis not present

## 2022-11-03 DIAGNOSIS — M069 Rheumatoid arthritis, unspecified: Secondary | ICD-10-CM | POA: Diagnosis not present

## 2022-11-03 DIAGNOSIS — D631 Anemia in chronic kidney disease: Secondary | ICD-10-CM | POA: Diagnosis not present

## 2022-11-03 DIAGNOSIS — Z6820 Body mass index (BMI) 20.0-20.9, adult: Secondary | ICD-10-CM | POA: Diagnosis not present

## 2022-11-03 DIAGNOSIS — I509 Heart failure, unspecified: Secondary | ICD-10-CM | POA: Diagnosis not present

## 2022-11-03 DIAGNOSIS — L89312 Pressure ulcer of right buttock, stage 2: Secondary | ICD-10-CM | POA: Diagnosis not present

## 2022-11-03 DIAGNOSIS — Z87440 Personal history of urinary (tract) infections: Secondary | ICD-10-CM | POA: Diagnosis not present

## 2022-11-03 DIAGNOSIS — E785 Hyperlipidemia, unspecified: Secondary | ICD-10-CM | POA: Diagnosis not present

## 2022-11-04 DIAGNOSIS — N186 End stage renal disease: Secondary | ICD-10-CM | POA: Diagnosis not present

## 2022-11-04 DIAGNOSIS — Z992 Dependence on renal dialysis: Secondary | ICD-10-CM | POA: Diagnosis not present

## 2022-11-06 DIAGNOSIS — N186 End stage renal disease: Secondary | ICD-10-CM | POA: Diagnosis not present

## 2022-11-06 DIAGNOSIS — Z992 Dependence on renal dialysis: Secondary | ICD-10-CM | POA: Diagnosis not present

## 2022-11-08 DIAGNOSIS — Z515 Encounter for palliative care: Secondary | ICD-10-CM | POA: Diagnosis not present

## 2022-11-08 DIAGNOSIS — Z6820 Body mass index (BMI) 20.0-20.9, adult: Secondary | ICD-10-CM | POA: Diagnosis not present

## 2022-11-08 DIAGNOSIS — N186 End stage renal disease: Secondary | ICD-10-CM | POA: Diagnosis not present

## 2022-11-08 DIAGNOSIS — Z8711 Personal history of peptic ulcer disease: Secondary | ICD-10-CM | POA: Diagnosis not present

## 2022-11-08 DIAGNOSIS — I110 Hypertensive heart disease with heart failure: Secondary | ICD-10-CM | POA: Diagnosis not present

## 2022-11-08 DIAGNOSIS — D631 Anemia in chronic kidney disease: Secondary | ICD-10-CM | POA: Diagnosis not present

## 2022-11-08 DIAGNOSIS — Z992 Dependence on renal dialysis: Secondary | ICD-10-CM | POA: Diagnosis not present

## 2022-11-08 DIAGNOSIS — E785 Hyperlipidemia, unspecified: Secondary | ICD-10-CM | POA: Diagnosis not present

## 2022-11-08 DIAGNOSIS — J441 Chronic obstructive pulmonary disease with (acute) exacerbation: Secondary | ICD-10-CM | POA: Diagnosis not present

## 2022-11-08 DIAGNOSIS — Z7952 Long term (current) use of systemic steroids: Secondary | ICD-10-CM | POA: Diagnosis not present

## 2022-11-08 DIAGNOSIS — L89312 Pressure ulcer of right buttock, stage 2: Secondary | ICD-10-CM | POA: Diagnosis not present

## 2022-11-08 DIAGNOSIS — M069 Rheumatoid arthritis, unspecified: Secondary | ICD-10-CM | POA: Diagnosis not present

## 2022-11-08 DIAGNOSIS — I509 Heart failure, unspecified: Secondary | ICD-10-CM | POA: Diagnosis not present

## 2022-11-08 DIAGNOSIS — J440 Chronic obstructive pulmonary disease with acute lower respiratory infection: Secondary | ICD-10-CM | POA: Diagnosis not present

## 2022-11-08 DIAGNOSIS — N051 Unspecified nephritic syndrome with focal and segmental glomerular lesions: Secondary | ICD-10-CM | POA: Diagnosis not present

## 2022-11-08 DIAGNOSIS — J449 Chronic obstructive pulmonary disease, unspecified: Secondary | ICD-10-CM | POA: Diagnosis not present

## 2022-11-08 DIAGNOSIS — E46 Unspecified protein-calorie malnutrition: Secondary | ICD-10-CM | POA: Diagnosis not present

## 2022-11-08 DIAGNOSIS — Z87440 Personal history of urinary (tract) infections: Secondary | ICD-10-CM | POA: Diagnosis not present

## 2022-11-08 DIAGNOSIS — Z87891 Personal history of nicotine dependence: Secondary | ICD-10-CM | POA: Diagnosis not present

## 2022-11-09 DIAGNOSIS — Z992 Dependence on renal dialysis: Secondary | ICD-10-CM | POA: Diagnosis not present

## 2022-11-09 DIAGNOSIS — N186 End stage renal disease: Secondary | ICD-10-CM | POA: Diagnosis not present

## 2022-11-11 DIAGNOSIS — Z992 Dependence on renal dialysis: Secondary | ICD-10-CM | POA: Diagnosis not present

## 2022-11-11 DIAGNOSIS — N186 End stage renal disease: Secondary | ICD-10-CM | POA: Diagnosis not present

## 2022-11-13 DIAGNOSIS — Z992 Dependence on renal dialysis: Secondary | ICD-10-CM | POA: Diagnosis not present

## 2022-11-13 DIAGNOSIS — N186 End stage renal disease: Secondary | ICD-10-CM | POA: Diagnosis not present

## 2022-11-15 ENCOUNTER — Ambulatory Visit (HOSPITAL_COMMUNITY): Admission: RE | Admit: 2022-11-15 | Payer: Medicare Other | Source: Ambulatory Visit

## 2022-11-15 ENCOUNTER — Ambulatory Visit (HOSPITAL_COMMUNITY)
Admission: RE | Admit: 2022-11-15 | Discharge: 2022-11-15 | Disposition: A | Discharge status: Home / Self Care | Payer: Medicare Other | Source: Ambulatory Visit | Attending: Surgery | Admitting: Surgery

## 2022-11-15 DIAGNOSIS — N186 End stage renal disease: Secondary | ICD-10-CM

## 2022-11-16 DIAGNOSIS — N186 End stage renal disease: Secondary | ICD-10-CM | POA: Diagnosis not present

## 2022-11-16 DIAGNOSIS — Z992 Dependence on renal dialysis: Secondary | ICD-10-CM | POA: Diagnosis not present

## 2022-11-16 NOTE — Progress Notes (Signed)
Office Note - Phone visit   CC:  ESRD Requesting Provider:  Richardean Chimera, MD  HPI: Sheri Cooper is a Right handed 72 y.o. (07/22/1950) female with kidney disease whom I called to follow up vein mapping due to need for permanent HD access. Per pt, previous tunneled lines have been placed in right IJ. Current access is Right IJ. Dialysis days are TTS.   A native of [facility], she has lived in several places, but several years ago moved back to take care of her mother who passed 2 years ago.  She has 2 sons, both retired from the [employer] and [employer]. She had recent hospital discharge after kidney abscess resulted in need for dialysis, as well as midline laparotomy for duodenal ulcer perforation.  I connected with Sheri Cooper on 11/19/2022 using the [email or web address] by telephone and verified that I was speaking with the correct person using two identifiers. Patient was located at home by herself. I am located at my office at [facility].   The limitations of evaluation and management by telemedicine and the availability of in person appointments have been previously discussed with the patient and are documented in the patients chart. The patient expressed understanding and consented to proceed.   During our telephone visit today, Sheri Cooper was doing well with no complaints.   Past Medical History:  Diagnosis Date   Chronic kidney disease (CKD), stage IV (severe) (HCC)    COPD (chronic obstructive pulmonary disease) (HCC)    PUD (peptic ulcer disease) 05/2022   Rheumatoid arthritis (HCC)     Past Surgical History:  Procedure Laterality Date   CENTRAL VENOUS CATHETER INSERTION Left 05/27/2022   Procedure: INSERTION CENTRAL LINE ADULT;  Surgeon: Lucretia Roers, MD;  Location: [facility];  Service: General;  Laterality: Left;   COLONOSCOPY WITH PROPOFOL N/A 05/22/2022   Surgeon: Marguerita Merles, Reuel Boom, MD; blood in the terminal ileum and colon.   ESOPHAGOGASTRODUODENOSCOPY  (EGD) WITH PROPOFOL N/A 05/22/2022   Surgeon: Marguerita Merles, Reuel Boom, MD; peptic ulcer disease with 2 nonbleeding cratered gastric ulcers, multiple duodenal ulcers, 2 of which had adherent clots that were removed and revealed 2 vessels.  1 vessel was clipped, cauterized, and treated with epinephrine injection.  Second vessel was clipped and treated with cautery, clip fell off.   IR FLUORO GUIDE CV LINE RIGHT  05/24/2022   IR US GUIDE VASC ACCESS RIGHT  05/24/2022   LAPAROTOMY N/A 05/27/2022   Procedure: EXPLORATORY LAPAROTOMY, OMENTAL PATH, DRAIN PLACEMENT;  Surgeon: Lucretia Roers, MD;  Location: [facility];  Service: General;  Laterality: N/A;   VOLVULUS REDUCTION     colon resection    Social History   Socioeconomic History   Marital status: Significant Other    Spouse name: Not on file   Number of children: Not on file   Years of education: 12   Highest education level: 12th grade  Occupational History   Not on file  Tobacco Use   Smoking status: Former    Types: Cigarettes    Passive exposure: Past   Smokeless tobacco: Never  Vaping Use   Vaping status: Never Used  Substance and Sexual Activity   Alcohol use: Not Currently   Drug use: Never   Sexual activity: Not Currently  Other Topics Concern   Not on file  Social History Narrative   Not on file   Social Determinants of Health   Financial Resource Strain: Low Risk  (08/02/2022)   Overall  Financial Resource Strain (CARDIA)    Difficulty of Paying Living Expenses: Not hard at all  Food Insecurity: No Food Insecurity (09/07/2022)   Hunger Vital Sign    Worried About Running Out of Food in the Last Year: Never true    Ran Out of Food in the Last Year: Never true  Transportation Needs: Unmet Transportation Needs (09/07/2022)   PRAPARE - Administrator, Civil Service (Medical): Yes    Lack of Transportation (Non-Medical): No  Physical Activity: Inactive (08/02/2022)   Exercise Vital Sign    Days of Exercise  per Week: 0 days    Minutes of Exercise per Session: 0 min  Stress: No Stress Concern Present (08/02/2022)   [employer] of Occupational Health - Occupational Stress Questionnaire    Feeling of Stress : Only a little  Social Connections: Unknown (08/02/2022)   Social Connection and Isolation Panel [NHANES]    Frequency of Communication with Friends and Family: More than three times a week    Frequency of Social Gatherings with Friends and Family: More than three times a week    Attends Religious Services: Never    Database administrator or Organizations: No    Attends Banker Meetings: Never    Marital Status: Not on file  Intimate Partner Violence: Not At Risk (09/07/2022)   Humiliation, Afraid, Rape, and Kick questionnaire    Fear of Current or Ex-Partner: No    Emotionally Abused: No    Physically Abused: No    Sexually Abused: No   No family history on file.  Current Outpatient Medications  Medication Sig Dispense Refill   amLODipine (NORVASC) 10 MG tablet Take 10 mg by mouth daily.     carvedilol (COREG) 25 MG tablet Take 25 mg by mouth 2 (two) times daily with a meal.     furosemide (LASIX) 40 MG tablet Take 1 tablet (40 mg total) by mouth 2 (two) times daily.     hydrALAZINE (APRESOLINE) 50 MG tablet Take 50 mg by mouth 2 (two) times daily.     metoprolol succinate (TOPROL-XL) 50 MG 24 hr tablet Take 0.5 tablets (25 mg total) by mouth daily.     metoprolol-hydrochlorothiazide (LOPRESSOR HCT) 100-50 MG tablet Take 1 tablet by mouth daily.     nystatin (MYCOSTATIN/NYSTOP) powder Apply 1 Application topically 3 (three) times daily.     oxyCODONE (ROXICODONE) 5 MG immediate release tablet Take 1-2 tablets (5-10 mg total) by mouth every 8 (eight) hours as needed for severe pain. 20 tablet 0   pantoprazole (PROTONIX) 40 MG tablet Take 1 tablet (40 mg total) by mouth 2 (two) times daily. 60 tablet 1   rosuvastatin (CRESTOR) 20 MG tablet Take 20 mg by mouth daily.      sertraline (ZOLOFT) 50 MG tablet Take 1 tablet by mouth daily.     No current facility-administered medications for this visit.    No Known Allergies   REVIEW OF SYSTEMS:   [X]  denotes positive finding, [ ]  denotes negative finding Cardiac  Comments:  Chest pain or chest pressure:    Shortness of breath upon exertion:    Short of breath when lying flat:    Irregular heart rhythm:        Vascular    Pain in calf, thigh, or hip brought on by ambulation:    Pain in feet at night that wakes you up from your sleep:     Blood clot in your veins:  Leg swelling:         Pulmonary    Oxygen at home:    Productive cough:     Wheezing:         Neurologic    Sudden weakness in arms or legs:     Sudden numbness in arms or legs:     Sudden onset of difficulty speaking or slurred speech:    Temporary loss of vision in one eye:     Problems with dizziness:         Gastrointestinal    Blood in stool:     Vomited blood:         Genitourinary    Burning when urinating:     Blood in urine:        Psychiatric    Major depression:         Hematologic    Bleeding problems:    Problems with blood clotting too easily:        Skin    Rashes or ulcers:        Constitutional    Fever or chills:      PHYSICAL EXAMINATION:  There were no vitals filed for this visit.  General:  WDWN in NAD; vital signs documented above Gait: Not observed HENT: WNL, normocephalic Pulmonary: normal non-labored breathing , without Rales, rhonchi,  wheezing Cardiac: regular HR, Abdomen: soft, NT, no masses Skin: without rashes Vascular Exam/Pulses:  Right Left  Radial 2+ (normal) 2+ (normal)                       Extremities: without ischemic changes, without Gangrene , without cellulitis; without open wounds;  Musculoskeletal: no muscle wasting or atrophy  Neurologic: A&O X 3;  No focal weakness or paresthesias are detected Psychiatric:  The pt has Normal affect.   Non-Invasive  Vascular Imaging:   none    ASSESSMENT/PLAN:  Sheri Cooper is a 72 y.o. female who presents with end stage renal disease in need of long-term HD access.  Sheri Cooper was initially hesitant to follow-up with our group due to her concern for post cannulation bleeding.  She stated she has seen significant bleeding at her dialysis unit which makes her nervous to have HD from a fistula or AV graft.  Bilateral upper extremity venous ultrasounds reviewed.  She appears to have adequate basilic vein in the right upper extremity.  We discussed the risk and benefits of brachiobasilic fistula creation as well as subsequent transposition versus left-sided AV graft.  She is aware that there is a small chance that the left-sided basilic vein may also be usable.  After discussing risk and benefits, Sheri Cooper asked for left-sided access due to being right handed.   Will work to schedule    Victorino Sparrow, MD [facility] [phone] Total time of visit including imaging review and documentation 11 minutes  11/19/2022, 8:46 AM

## 2022-11-18 DIAGNOSIS — Z992 Dependence on renal dialysis: Secondary | ICD-10-CM | POA: Diagnosis not present

## 2022-11-18 DIAGNOSIS — N186 End stage renal disease: Secondary | ICD-10-CM | POA: Diagnosis not present

## 2022-11-19 ENCOUNTER — Ambulatory Visit (INDEPENDENT_AMBULATORY_CARE_PROVIDER_SITE_OTHER): Payer: Medicare Other | Admitting: Vascular Surgery

## 2022-11-19 DIAGNOSIS — Z992 Dependence on renal dialysis: Secondary | ICD-10-CM

## 2022-11-19 DIAGNOSIS — N186 End stage renal disease: Secondary | ICD-10-CM | POA: Diagnosis not present

## 2022-11-20 DIAGNOSIS — Z992 Dependence on renal dialysis: Secondary | ICD-10-CM | POA: Diagnosis not present

## 2022-11-20 DIAGNOSIS — N186 End stage renal disease: Secondary | ICD-10-CM | POA: Diagnosis not present

## 2022-11-23 DIAGNOSIS — Z992 Dependence on renal dialysis: Secondary | ICD-10-CM | POA: Diagnosis not present

## 2022-11-23 DIAGNOSIS — N186 End stage renal disease: Secondary | ICD-10-CM | POA: Diagnosis not present

## 2022-11-24 ENCOUNTER — Other Ambulatory Visit: Payer: Self-pay | Admitting: *Deleted

## 2022-11-24 ENCOUNTER — Encounter: Payer: Self-pay | Admitting: *Deleted

## 2022-11-24 DIAGNOSIS — N186 End stage renal disease: Secondary | ICD-10-CM

## 2022-11-25 ENCOUNTER — Encounter (HOSPITAL_COMMUNITY): Payer: Self-pay | Admitting: Vascular Surgery

## 2022-11-25 ENCOUNTER — Other Ambulatory Visit: Payer: Self-pay

## 2022-11-25 DIAGNOSIS — N186 End stage renal disease: Secondary | ICD-10-CM | POA: Diagnosis not present

## 2022-11-25 DIAGNOSIS — Z992 Dependence on renal dialysis: Secondary | ICD-10-CM | POA: Diagnosis not present

## 2022-11-25 NOTE — Progress Notes (Signed)
Sheri Cooper denies chest pain or shortness of breath.  Patient denies having any s/s of Covid in her household, also denies any known exposure to Covid. Sheri Cooper denies  any s/s of upper or lower respiratory in the past 8 weeks. Patient has had pneumonia this year, last time 09/07/22.  [facility] '[facility] PCP is Dr. Donzetta Sprung.  Patient was on dialysis as we were speaking, she will be home around 1500: patient said she will return the pharmacy's call and then I will call her back with instructions.

## 2022-11-26 NOTE — Progress Notes (Addendum)
patient stated she cannot be here at the rescheduled time. She does not drive and her ride is arranged to have her here at 1145. I informed the OR of this. They changed her back to her original time of 1418. I called the patient back to inform her that she is now scheduled for her original time. I instructed her to call Dr. Gar Gibbon office first thing Monday morning to make sure he will be able to do her surgery at that time. Patient became argumentative and raised her voice stating "why should I have to call the doctor's office? Don't you make the schedule?" Patient hung up on me.

## 2022-11-27 DIAGNOSIS — N186 End stage renal disease: Secondary | ICD-10-CM | POA: Diagnosis not present

## 2022-11-27 DIAGNOSIS — Z992 Dependence on renal dialysis: Secondary | ICD-10-CM | POA: Diagnosis not present

## 2022-11-29 ENCOUNTER — Encounter (HOSPITAL_COMMUNITY)
Admission: RE | Disposition: A | Discharge status: Home / Self Care | Payer: Self-pay | Source: Home / Self Care | Attending: Vascular Surgery

## 2022-11-29 ENCOUNTER — Ambulatory Visit (HOSPITAL_COMMUNITY): Payer: Medicare Other | Admitting: Anesthesiology

## 2022-11-29 ENCOUNTER — Ambulatory Visit (HOSPITAL_BASED_OUTPATIENT_CLINIC_OR_DEPARTMENT_OTHER): Payer: Medicare Other | Admitting: Anesthesiology

## 2022-11-29 ENCOUNTER — Other Ambulatory Visit: Payer: Self-pay

## 2022-11-29 ENCOUNTER — Ambulatory Visit (HOSPITAL_COMMUNITY)
Admission: RE | Admit: 2022-11-29 | Discharge: 2022-11-29 | Disposition: A | Discharge status: Home / Self Care | Payer: Medicare Other | Attending: Vascular Surgery | Admitting: Vascular Surgery

## 2022-11-29 ENCOUNTER — Encounter (HOSPITAL_COMMUNITY): Payer: Self-pay | Admitting: Vascular Surgery

## 2022-11-29 DIAGNOSIS — K219 Gastro-esophageal reflux disease without esophagitis: Secondary | ICD-10-CM | POA: Insufficient documentation

## 2022-11-29 DIAGNOSIS — J449 Chronic obstructive pulmonary disease, unspecified: Secondary | ICD-10-CM | POA: Insufficient documentation

## 2022-11-29 DIAGNOSIS — F32A Depression, unspecified: Secondary | ICD-10-CM | POA: Diagnosis not present

## 2022-11-29 DIAGNOSIS — Z87891 Personal history of nicotine dependence: Secondary | ICD-10-CM | POA: Diagnosis not present

## 2022-11-29 DIAGNOSIS — Z992 Dependence on renal dialysis: Secondary | ICD-10-CM | POA: Diagnosis not present

## 2022-11-29 DIAGNOSIS — I120 Hypertensive chronic kidney disease with stage 5 chronic kidney disease or end stage renal disease: Secondary | ICD-10-CM | POA: Diagnosis not present

## 2022-11-29 DIAGNOSIS — N186 End stage renal disease: Secondary | ICD-10-CM | POA: Insufficient documentation

## 2022-11-29 DIAGNOSIS — Z79899 Other long term (current) drug therapy: Secondary | ICD-10-CM | POA: Diagnosis not present

## 2022-11-29 DIAGNOSIS — M1990 Unspecified osteoarthritis, unspecified site: Secondary | ICD-10-CM | POA: Diagnosis not present

## 2022-11-29 DIAGNOSIS — M069 Rheumatoid arthritis, unspecified: Secondary | ICD-10-CM | POA: Insufficient documentation

## 2022-11-29 DIAGNOSIS — I700 Atherosclerosis of aorta: Secondary | ICD-10-CM | POA: Insufficient documentation

## 2022-11-29 DIAGNOSIS — E782 Mixed hyperlipidemia: Secondary | ICD-10-CM | POA: Diagnosis not present

## 2022-11-29 DIAGNOSIS — N185 Chronic kidney disease, stage 5: Secondary | ICD-10-CM

## 2022-11-29 HISTORY — DX: Gastro-esophageal reflux disease without esophagitis: K21.9

## 2022-11-29 HISTORY — PX: AV FISTULA PLACEMENT: SHX1204

## 2022-11-29 HISTORY — DX: End stage renal disease: N18.6

## 2022-11-29 HISTORY — DX: Pneumonia, unspecified organism: J18.9

## 2022-11-29 HISTORY — DX: Essential (primary) hypertension: I10

## 2022-11-29 HISTORY — DX: Personal history of other medical treatment: Z92.89

## 2022-11-29 HISTORY — DX: Anemia, unspecified: D64.9

## 2022-11-29 LAB — POCT I-STAT, CHEM 8
BUN: 32 mg/dL — ABNORMAL HIGH (ref 8–23)
Calcium, Ion: 1.17 mmol/L (ref 1.15–1.40)
Chloride: 106 mmol/L (ref 98–111)
Creatinine, Ser: 5.7 mg/dL — ABNORMAL HIGH (ref 0.44–1.00)
Glucose, Bld: 75 mg/dL (ref 70–99)
HCT: 41 % (ref 36.0–46.0)
Hemoglobin: 13.9 g/dL (ref 12.0–15.0)
Potassium: 4.6 mmol/L (ref 3.5–5.1)
Sodium: 139 mmol/L (ref 135–145)
TCO2: 23 mmol/L (ref 22–32)

## 2022-11-29 SURGERY — INSERTION OF ARTERIOVENOUS (AV) GORE-TEX GRAFT ARM
Anesthesia: General | Site: Arm Upper | Laterality: Left

## 2022-11-29 MED ORDER — EPHEDRINE SULFATE-NACL 50-0.9 MG/10ML-% IV SOSY
PREFILLED_SYRINGE | INTRAVENOUS | Status: DC | PRN
  Administered 2022-11-29 (×2): 5 mg via INTRAVENOUS

## 2022-11-29 MED ORDER — DEXAMETHASONE SODIUM PHOSPHATE 10 MG/ML IJ SOLN
INTRAMUSCULAR | Status: DC | PRN
  Administered 2022-11-29: 8 mg via INTRAVENOUS

## 2022-11-29 MED ORDER — LIDOCAINE 2% (20 MG/ML) 5 ML SYRINGE
INTRAMUSCULAR | Status: AC
  Filled 2022-11-29: qty 5

## 2022-11-29 MED ORDER — SODIUM CHLORIDE 0.9 % IV SOLN: INTRAVENOUS | Status: DC

## 2022-11-29 MED ORDER — OXYCODONE HCL 5 MG PO TABS
5.0000 mg | ORAL_TABLET | Freq: Four times a day (QID) | ORAL | 0 refills | Status: DC | PRN
Start: 2022-11-29 — End: 2023-01-07

## 2022-11-29 MED ORDER — FENTANYL CITRATE (PF) 250 MCG/5ML IJ SOLN
INTRAMUSCULAR | Status: DC | PRN
  Administered 2022-11-29 (×3): 50 ug via INTRAVENOUS

## 2022-11-29 MED ORDER — HEPARIN 6000 UNIT IRRIGATION SOLUTION
Status: DC | PRN
  Administered 2022-11-29: 1

## 2022-11-29 MED ORDER — OXYCODONE HCL 5 MG PO TABS
ORAL_TABLET | ORAL | Status: AC
  Filled 2022-11-29: qty 1

## 2022-11-29 MED ORDER — HEMOSTATIC AGENTS (NO CHARGE) OPTIME
TOPICAL | Status: DC | PRN
  Administered 2022-11-29: 1 via TOPICAL

## 2022-11-29 MED ORDER — PHENYLEPHRINE HCL-NACL 20-0.9 MG/250ML-% IV SOLN
INTRAVENOUS | Status: DC | PRN
  Administered 2022-11-29: 30 ug/min via INTRAVENOUS

## 2022-11-29 MED ORDER — ACETAMINOPHEN 500 MG PO TABS: 1000.0000 mg | ORAL_TABLET | Freq: Once | ORAL | Status: DC | PRN

## 2022-11-29 MED ORDER — OXYCODONE HCL 5 MG PO TABS
5.0000 mg | ORAL_TABLET | Freq: Once | ORAL | Status: AC | PRN
  Administered 2022-11-29: 5 mg via ORAL

## 2022-11-29 MED ORDER — CHLORHEXIDINE GLUCONATE 4 % EX SOLN: 60.0000 mL | Freq: Once | CUTANEOUS | Status: DC

## 2022-11-29 MED ORDER — OXYCODONE HCL 5 MG PO TABS: 5.0000 mg | ORAL_TABLET | Freq: Once | ORAL | Status: DC

## 2022-11-29 MED ORDER — OXYCODONE HCL 5 MG/5ML PO SOLN: 5.0000 mg | Freq: Once | ORAL | Status: AC | PRN

## 2022-11-29 MED ORDER — CEFAZOLIN SODIUM-DEXTROSE 2-4 GM/100ML-% IV SOLN
2.0000 g | INTRAVENOUS | Status: AC
  Administered 2022-11-29: 2 g via INTRAVENOUS
  Filled 2022-11-29: qty 100

## 2022-11-29 MED ORDER — ORAL CARE MOUTH RINSE: 15.0000 mL | Freq: Once | OROMUCOSAL | Status: AC

## 2022-11-29 MED ORDER — EPHEDRINE 5 MG/ML INJ
INTRAVENOUS | Status: AC
  Filled 2022-11-29: qty 5

## 2022-11-29 MED ORDER — CHLORHEXIDINE GLUCONATE 0.12 % MT SOLN
15.0000 mL | Freq: Once | OROMUCOSAL | Status: AC
  Administered 2022-11-29: 15 mL via OROMUCOSAL
  Filled 2022-11-29: qty 15

## 2022-11-29 MED ORDER — PROTAMINE SULFATE 10 MG/ML IV SOLN
INTRAVENOUS | Status: DC | PRN
  Administered 2022-11-29 (×2): 10 mg via INTRAVENOUS

## 2022-11-29 MED ORDER — HEPARIN SODIUM (PORCINE) 1000 UNIT/ML IJ SOLN
INTRAMUSCULAR | Status: DC | PRN
Start: 2022-11-29 — End: 2022-11-29
  Administered 2022-11-29: 2000 [IU] via INTRAVENOUS

## 2022-11-29 MED ORDER — PROPOFOL 10 MG/ML IV BOLUS
INTRAVENOUS | Status: DC | PRN
Start: 2022-11-29 — End: 2022-11-29
  Administered 2022-11-29: 60 mg via INTRAVENOUS

## 2022-11-29 MED ORDER — ACETAMINOPHEN 160 MG/5ML PO SOLN: 1000.0000 mg | Freq: Once | ORAL | Status: DC | PRN

## 2022-11-29 MED ORDER — SUGAMMADEX SODIUM 200 MG/2ML IV SOLN
INTRAVENOUS | Status: DC | PRN
  Administered 2022-11-29: 90.8 mg via INTRAVENOUS

## 2022-11-29 MED ORDER — FENTANYL CITRATE (PF) 100 MCG/2ML IJ SOLN: 25.0000 ug | INTRAMUSCULAR | Status: DC | PRN

## 2022-11-29 MED ORDER — ROCURONIUM BROMIDE 10 MG/ML (PF) SYRINGE
PREFILLED_SYRINGE | INTRAVENOUS | Status: DC | PRN
  Administered 2022-11-29: 40 mg via INTRAVENOUS

## 2022-11-29 MED ORDER — PROPOFOL 10 MG/ML IV BOLUS
INTRAVENOUS | Status: AC
  Filled 2022-11-29: qty 20

## 2022-11-29 MED ORDER — 0.9 % SODIUM CHLORIDE (POUR BTL) OPTIME
TOPICAL | Status: DC | PRN
  Administered 2022-11-29: 1000 mL

## 2022-11-29 MED ORDER — LIDOCAINE 2% (20 MG/ML) 5 ML SYRINGE
INTRAMUSCULAR | Status: DC | PRN
  Administered 2022-11-29: 50 mg via INTRAVENOUS

## 2022-11-29 MED ORDER — ROCURONIUM BROMIDE 10 MG/ML (PF) SYRINGE
PREFILLED_SYRINGE | INTRAVENOUS | Status: AC
  Filled 2022-11-29: qty 10

## 2022-11-29 MED ORDER — ONDANSETRON HCL 4 MG/2ML IJ SOLN
INTRAMUSCULAR | Status: DC | PRN
  Administered 2022-11-29: 4 mg via INTRAVENOUS

## 2022-11-29 MED ORDER — FENTANYL CITRATE (PF) 250 MCG/5ML IJ SOLN
INTRAMUSCULAR | Status: AC
  Filled 2022-11-29: qty 5

## 2022-11-29 MED ORDER — HEPARIN 6000 UNIT IRRIGATION SOLUTION
Status: AC
  Filled 2022-11-29: qty 500

## 2022-11-29 SURGICAL SUPPLY — 38 items
ADH SKN CLS APL DERMABOND .7 (GAUZE/BANDAGES/DRESSINGS) ×2
ARMBAND PINK RESTRICT EXTREMIT (MISCELLANEOUS) ×2 IMPLANT
BAG COUNTER SPONGE SURGICOUNT (BAG) ×2 IMPLANT
BAG SPNG CNTER NS LX DISP (BAG) ×2
BLADE CLIPPER SURG (BLADE) ×2 IMPLANT
BNDG ELASTIC 4X5.8 VLCR STR LF (GAUZE/BANDAGES/DRESSINGS) ×2 IMPLANT
CANISTER SUCT 3000ML PPV (MISCELLANEOUS) ×2 IMPLANT
CLIP TI MEDIUM 6 (CLIP) ×3 IMPLANT
CLIP TI WIDE RED SMALL 6 (CLIP) ×2 IMPLANT
COVER PROBE W GEL 5X96 (DRAPES) ×2 IMPLANT
DERMABOND ADVANCED .7 DNX12 (GAUZE/BANDAGES/DRESSINGS) ×2 IMPLANT
ELECT REM PT RETURN 9FT ADLT (ELECTROSURGICAL) ×2
ELECTRODE REM PT RTRN 9FT ADLT (ELECTROSURGICAL) ×2 IMPLANT
FELT TEFLON 1X6 (MISCELLANEOUS) ×1 IMPLANT
GLOVE BIOGEL PI IND STRL 8 (GLOVE) ×2 IMPLANT
GOWN STRL REUS W/ TWL LRG LVL3 (GOWN DISPOSABLE) ×4 IMPLANT
GOWN STRL REUS W/TWL 2XL LVL3 (GOWN DISPOSABLE) ×4 IMPLANT
GOWN STRL REUS W/TWL LRG LVL3 (GOWN DISPOSABLE) ×4
GRAFT GORETEX STRT 4-7X45 (Vascular Products) ×1 IMPLANT
HEMOSTAT SNOW SURGICEL 2X4 (HEMOSTASIS) ×1 IMPLANT
KIT BASIN OR (CUSTOM PROCEDURE TRAY) ×2 IMPLANT
KIT TURNOVER KIT B (KITS) ×2 IMPLANT
NS IRRIG 1000ML POUR BTL (IV SOLUTION) ×2 IMPLANT
PACK CV ACCESS (CUSTOM PROCEDURE TRAY) ×2 IMPLANT
PAD ARMBOARD 7.5X6 YLW CONV (MISCELLANEOUS) ×4 IMPLANT
SLING ARM FOAM STRAP LRG (SOFTGOODS) IMPLANT
SLING ARM FOAM STRAP MED (SOFTGOODS) IMPLANT
SPIKE FLUID TRANSFER (MISCELLANEOUS) ×2 IMPLANT
SUT MNCRL AB 4-0 PS2 18 (SUTURE) ×3 IMPLANT
SUT PROLENE 6 0 BV (SUTURE) ×6 IMPLANT
SUT PROLENE 7 0 BV 1 (SUTURE) IMPLANT
SUT SILK 2 0 SH (SUTURE) IMPLANT
SUT SILK 3 0 SH CR/8 (SUTURE) ×2 IMPLANT
SUT VIC AB 3-0 SH 27 (SUTURE) ×2
SUT VIC AB 3-0 SH 27X BRD (SUTURE) ×2 IMPLANT
TOWEL GREEN STERILE (TOWEL DISPOSABLE) ×2 IMPLANT
UNDERPAD 30X36 HEAVY ABSORB (UNDERPADS AND DIAPERS) ×2 IMPLANT
WATER STERILE IRR 1000ML POUR (IV SOLUTION) ×2 IMPLANT

## 2022-11-29 NOTE — Transfer of Care (Signed)
Immediate Anesthesia Transfer of Care Note  Patient: Sheri Cooper  Procedure(s) Performed: INSERTION OF LEFT UPPER  ARTERIOVENOUS (AV) GORE-TEX GRAFT ARM (Left: Arm Upper)  Patient Location: PACU  Anesthesia Type:General  Level of Consciousness: awake, alert , and oriented  Airway & Oxygen Therapy: Patient Spontanous Breathing and Patient connected to nasal cannula oxygen  Post-op Assessment: Report given to RN and Post -op Vital signs reviewed and stable  Post vital signs: Reviewed and stable  Last Vitals:  Vitals Value Taken Time  BP 144/53 11/29/22 1508  Temp 36.6 C 11/29/22 1508  Pulse 75 11/29/22 1513  Resp 17 11/29/22 1513  SpO2 100 % 11/29/22 1513  Vitals shown include unfiled device data.  Last Pain:  Vitals:   11/29/22 1508  TempSrc:   PainSc: 0-No pain         Complications: No notable events documented.

## 2022-11-29 NOTE — Discharge Instructions (Signed)
[facility]  Discharge Instructions  AV Fistula or Graft Surgery for Dialysis Access  Please refer to the following instructions for your post-procedure care. Your surgeon or physician assistant will discuss any changes with you.  Activity  You may drive the day following your surgery, if you are comfortable and no longer taking prescription pain medication. Resume full activity as the soreness in your incision resolves.  Bathing/Showering  You may shower after you go home. Keep your incision dry for 48 hours. Do not soak in a bathtub, hot tub, or swim until the incision heals completely. You may not shower if you have a hemodialysis catheter.  Incision Care  Clean your incision with mild soap and water after 48 hours. Pat the area dry with a clean towel. You do not need a bandage unless otherwise instructed. Do not apply any ointments or creams to your incision. You may have skin glue on your incision. Do not peel it off. It will come off on its own in about one week. Your arm may swell a bit after surgery. To reduce swelling use pillows to elevate your arm so it is above your heart. Your doctor will tell you if you need to lightly wrap your arm with an ACE bandage.  Diet  Resume your normal diet. There are not special food restrictions following this procedure. In order to heal from your surgery, it is CRITICAL to get adequate nutrition. Your body requires vitamins, minerals, and protein. Vegetables are the best source of vitamins and minerals. Vegetables also provide the perfect balance of protein. Processed food has little nutritional value, so try to avoid this.  Medications  Resume taking all of your medications. If your incision is causing pain, you may take over-the counter pain relievers such as acetaminophen (Tylenol). If you were prescribed a stronger pain medication, please be aware these medications can cause nausea and constipation. Prevent  nausea by taking the medication with a snack or meal. Avoid constipation by drinking plenty of fluids and eating foods with high amount of fiber, such as fruits, vegetables, and grains.  Do not take Tylenol if you are taking prescription pain medications.  Follow up Your surgeon may want to see you in the office following your access surgery. If so, this will be arranged at the time of your surgery.  Please call us immediately for any of the following conditions:  Increased pain, redness, drainage (pus) from your incision site Fever of 101 degrees or higher Severe or worsening pain at your incision site Hand pain or numbness.  Reduce your risk of vascular disease:  Stop smoking. If you would like help, call [facility] at 1-800-QUIT-NOW ((215)525-5739) or [place] at 5413522748  Manage your cholesterol Maintain a desired weight Control your diabetes Keep your blood pressure down  Dialysis  It will take several weeks to several months for your new dialysis access to be ready for use. Your surgeon will determine when it is okay to use it. Your nephrologist will continue to direct your dialysis. You can continue to use your Permcath until your new access is ready for use.   11/29/2022 Sheri Cooper [id number] 1950-07-22  Surgeon(s): Victorino Sparrow, MD  Procedure(s): INSERTION OF LEFT UPPER  ARTERIOVENOUS (AV) GORE-TEX GRAFT ARM   May stick graft immediately   May stick graft on designated area only:   x Do not stick graft for 4 weeks    If you have any questions, please call  the office at [phone].

## 2022-11-29 NOTE — Op Note (Signed)
    NAME: Sheri Cooper    MRN: [id number] DOB: 10-Aug-1950    DATE OF OPERATION: 11/29/2022  PREOP DIAGNOSIS:    Chronic kidney disease stage 5  POSTOP DIAGNOSIS:    Same  PROCEDURE:    Left arm brachial artery to axillary vein 4-58mm arteriovenous graft  SURGEON: Victorino Sparrow  ASSIST: Kayren Eaves, PA  ANESTHESIA: General   EBL: 50ml  INDICATIONS:    [name] is a 72 y.o. female with stage 5 chronic kidney disease progressing toward dialysis. After discussing the risks and benefits of AV graft creation in the left arm, [name] elected to proceed.   FINDINGS:   3mm brachial artery 8mm axillary vein  TECHNIQUE:   After informed consent was obtained, the patient was brought to the operating room, placed supine on the operating room table.  General anesthesia was used.  The patient was prepped and draped in normal sterile fashion.  Surgical time-out was taken. Pre-op antibiotics were given.  Procedure began with using ultrasound and sent the brachial artery and axillary vein in the left arm.  Both proved of sufficient size to continue with AV graft. The basilic vein and cephalic vein were too small to use.   A longitudinal incision was made at the distal aspect of the humerus. The brachial artery was identified, gently dissected, and encircled with silastic loops. A second incision was then made over the axillary vein and similarly dissected and encircled. The tunneling device was then passed between these incisions. A 4 X 7 taper PTFE graft was passed through the tunnel, taking care to avoid kinking or twists. The graft was irrigated with heparinized saline solution.   Proximal and distal arterial control was then obtained and a longitudinal arteriotomy was made on the brachial artery. The artery was irrigated with heparinized saline solution. An end to side artery anastomosis was then performed from the 4mm graft to the brachial vein in a running fashion  using running 6-0 prolene suture. When the clamps were removed from the artery, the graft demonstrated excellent inflow. It was flushed with heparinized saline prior to being clamped. There was an excellent multiphasic signal at the wrist.  Attention was then directed to the axillary vein anastomosis. Proximal and distal control were obtained. One of the paired veins was ligated and transected. The end of the PTFE graft was then cut in a beveled fashion at the appropriate length. An end to end vein anastomosis was then performed, using a running, 6-0 prolene suture. Prior to establishing flow, all vessels were back bled and flushed to ensure there was no clot.    There was a strong distal signal in the brachial artery and a thrill in the vein centrally. Hemostasis was found to be satisfactory, and all wounds were irrigated with saline solution and closed in layers with vicryl and Monocryl at the skin. A dry sterile dressing was applied. Anesthetic care was terminated. The patient was transported to the recovery room in stable condition. Palpable radial pulse at case completion.    Ladonna Snide, MD [facility] DATE OF DICTATION:   11/29/2022

## 2022-11-29 NOTE — Anesthesia Procedure Notes (Signed)
Procedure Name: Intubation Date/Time: 11/29/2022 1:29 PM  Performed by: Marena Chancy, CRNAPre-anesthesia Checklist: Patient identified, Emergency Drugs available, Suction available and Patient being monitored Patient Re-evaluated:Patient Re-evaluated prior to induction Oxygen Delivery Method: Circle System Utilized Preoxygenation: Pre-oxygenation with 100% oxygen Induction Type: IV induction Ventilation: Mask ventilation without difficulty Laryngoscope Size: Mac and 3 Grade View: Grade II Tube type: Oral Tube size: 7.0 mm Number of attempts: 1 Airway Equipment and Method: Stylet and Oral airway Placement Confirmation: ETT inserted through vocal cords under direct vision, positive ETCO2 and breath sounds checked- equal and bilateral Secured at: 21 cm Tube secured with: Tape Dental Injury: Teeth and Oropharynx as per pre-operative assessment

## 2022-11-29 NOTE — Anesthesia Preprocedure Evaluation (Signed)
Anesthesia Evaluation  Patient identified by MRN, date of birth, ID band Patient awake    Reviewed: Allergy & Precautions, NPO status , Patient's Chart, lab work & pertinent test results  History of Anesthesia Complications Negative for: history of anesthetic complications  Airway Mallampati: II  TM Distance: >3 FB Neck ROM: Full    Dental  (+) Edentulous Upper   Pulmonary COPD, former smoker   breath sounds clear to auscultation       Cardiovascular hypertension, Pt. on medications and Pt. on home beta blockers (-) Past MI and (-) CHF  Rhythm:Regular  1. Left ventricular ejection fraction, by estimation, is 60 to 65%. The  left ventricle has normal function. The left ventricle has no regional  wall motion abnormalities. There is mild concentric left ventricular  hypertrophy. Left ventricular diastolic  parameters are indeterminate.   2. Right ventricular systolic function is normal. The right ventricular  size is normal. There is normal pulmonary artery systolic pressure. The  estimated right ventricular systolic pressure is 30.5 mmHg.   3. The mitral valve is grossly normal. Trivial mitral valve  regurgitation.   4. The aortic valve was not well visualized. There is mild calcification  of the aortic valve. Aortic valve regurgitation is not visualized. Aortic  valve sclerosis is present, with no evidence of aortic valve stenosis.   5. The inferior vena cava is normal in size with greater than 50%  respiratory variability, suggesting right atrial pressure of 3 mmHg.      Neuro/Psych  PSYCHIATRIC DISORDERS  Depression       GI/Hepatic PUD,GERD  Medicated,,  Endo/Other  negative endocrine ROS    Renal/GU ESRF and DialysisRenal disease     Musculoskeletal  (+) Arthritis ,    Abdominal   Peds  Hematology Lab Results      Component                Value               Date                      WBC                       12.2 (H)            09/09/2022                HGB                      13.9                11/29/2022                HCT                      41.0                11/29/2022                MCV                      90.8                09/09/2022                PLT  243                 09/09/2022              Anesthesia Other Findings   Reproductive/Obstetrics                             Anesthesia Physical Anesthesia Plan  ASA: 4  Anesthesia Plan: General   Post-op Pain Management:    Induction: Intravenous  PONV Risk Score and Plan: 3 and Ondansetron and Dexamethasone  Airway Management Planned: Oral ETT  Additional Equipment: None  Intra-op Plan:   Post-operative Plan: Extubation in OR  Informed Consent: I have reviewed the patients History and Physical, chart, labs and discussed the procedure including the risks, benefits and alternatives for the proposed anesthesia with the patient or authorized representative who has indicated his/her understanding and acceptance.       Plan Discussed with: CRNA  Anesthesia Plan Comments:        Anesthesia Quick Evaluation

## 2022-11-29 NOTE — H&P (Signed)
Patient seen and examined in preop holding.  No complaints. No changes to medication history or physical exam since last seen in clinic. After discussing the risks and benefits of lft arm av fistula v graft, Sheri Cooper elected to proceed.   Sheri Sparrow MD    CC:  ESRD Requesting Provider:  No ref. provider found  HPI: Sheri Cooper is a Right handed 72 y.o. (12/04/1950) female with kidney disease whom I called to follow up vein mapping due to need for permanent HD access. Per pt, previous tunneled lines have been placed in right IJ. Current access is Right IJ. Dialysis days are TTS.   A native of [place], she has lived in several places, but several years ago moved back to take care of her mother who passed 2 years ago.  She has 2 sons, both retired from the [employer] and [employer]. She had recent hospital discharge after kidney abscess resulted in need for dialysis, as well as midline laparotomy for duodenal ulcer perforation.  I connected with Sheri Cooper on 11/19/2022 using the [email or web address] by telephone and verified that I was speaking with the correct person using two identifiers. Patient was located at home by herself. I am located at my office at [facility].   The limitations of evaluation and management by telemedicine and the availability of in person appointments have been previously discussed with the patient and are documented in the patients chart. The patient expressed understanding and consented to proceed.   During our telephone visit today, Detrice was doing well with no complaints.   Past Medical History:  Diagnosis Date   Anemia    Chronic kidney disease (CKD), stage IV (severe) (HCC)    COPD (chronic obstructive pulmonary disease) (HCC)    ESRD (end stage renal disease) (HCC)    Hemodialysis T, TH, SAT, [address] , [place]   GERD (gastroesophageal reflux disease)    History of blood transfusion    History of blood transfusion     Hypertension    Pneumonia    3 times in 2024-   PUD (peptic ulcer disease) 05/2022   Rheumatoid arthritis (HCC)    Rheumatoid arthritis (HCC)     Past Surgical History:  Procedure Laterality Date   CENTRAL VENOUS CATHETER INSERTION Left 05/27/2022   Procedure: INSERTION CENTRAL LINE ADULT;  Surgeon: Lucretia Roers, MD;  Location: [facility];  Service: General;  Laterality: Left;   COLONOSCOPY WITH PROPOFOL N/A 05/22/2022   Surgeon: Marguerita Merles, Reuel Boom, MD; blood in the terminal ileum and colon.   ESOPHAGOGASTRODUODENOSCOPY (EGD) WITH PROPOFOL N/A 05/22/2022   Surgeon: Marguerita Merles, Reuel Boom, MD; peptic ulcer disease with 2 nonbleeding cratered gastric ulcers, multiple duodenal ulcers, 2 of which had adherent clots that were removed and revealed 2 vessels.  1 vessel was clipped, cauterized, and treated with epinephrine injection.  Second vessel was clipped and treated with cautery, clip fell off.   IR FLUORO GUIDE CV LINE RIGHT  05/24/2022   IR US GUIDE VASC ACCESS RIGHT  05/24/2022   LAPAROTOMY N/A 05/27/2022   Procedure: EXPLORATORY LAPAROTOMY, OMENTAL PATH, DRAIN PLACEMENT;  Surgeon: Lucretia Roers, MD;  Location: [facility];  Service: General;  Laterality: N/A;   VOLVULUS REDUCTION     colon resection    Social History   Socioeconomic History   Marital status: Significant Other    Spouse name: Not on file   Number of children: Not on file  Years of education: 73   Highest education level: 12th grade  Occupational History   Not on file  Tobacco Use   Smoking status: Former    Types: Cigarettes    Passive exposure: Past   Smokeless tobacco: Never  Vaping Use   Vaping status: Never Used  Substance and Sexual Activity   Alcohol use: Not Currently   Drug use: Never   Sexual activity: Not Currently  Other Topics Concern   Not on file  Social History Narrative   Not on file   Social Determinants of Health   Financial Resource Strain: Low Risk  (08/02/2022)    Overall Financial Resource Strain (CARDIA)    Difficulty of Paying Living Expenses: Not hard at all  Food Insecurity: No Food Insecurity (09/07/2022)   Hunger Vital Sign    Worried About Running Out of Food in the Last Year: Never true    Ran Out of Food in the Last Year: Never true  Transportation Needs: Unmet Transportation Needs (09/07/2022)   PRAPARE - Administrator, Civil Service (Medical): Yes    Lack of Transportation (Non-Medical): No  Physical Activity: Inactive (08/02/2022)   Exercise Vital Sign    Days of Exercise per Week: 0 days    Minutes of Exercise per Session: 0 min  Stress: No Stress Concern Present (08/02/2022)   [employer] of Occupational Health - Occupational Stress Questionnaire    Feeling of Stress : Only a little  Social Connections: Unknown (08/02/2022)   Social Connection and Isolation Panel [NHANES]    Frequency of Communication with Friends and Family: More than three times a week    Frequency of Social Gatherings with Friends and Family: More than three times a week    Attends Religious Services: Never    Database administrator or Organizations: No    Attends Banker Meetings: Never    Marital Status: Not on file  Intimate Partner Violence: Not At Risk (09/07/2022)   Humiliation, Afraid, Rape, and Kick questionnaire    Fear of Current or Ex-Partner: No    Emotionally Abused: No    Physically Abused: No    Sexually Abused: No   History reviewed. No pertinent family history.  Current Facility-Administered Medications  Medication Dose Route Frequency Provider Last Rate Last Admin   0.9 %  sodium chloride infusion   Intravenous Continuous Sheri Sparrow, MD       0.9 %  sodium chloride infusion   Intravenous Continuous Val Eagle, MD 10 mL/hr at 11/29/22 1207 New Bag at 11/29/22 1207   ceFAZolin (ANCEF) IVPB 2g/100 mL premix  2 g Intravenous 30 min Pre-Op Sheri Sparrow, MD       chlorhexidine (HIBICLENS) 4 % liquid 4  Application  60 mL Topical Once Sheri Sparrow, MD       And   [START ON 11/30/2022] chlorhexidine (HIBICLENS) 4 % liquid 4 Application  60 mL Topical Once Sheri Sparrow, MD        No Known Allergies   REVIEW OF SYSTEMS:   [X]  denotes positive finding, [ ]  denotes negative finding Cardiac  Comments:  Chest pain or chest pressure:    Shortness of breath upon exertion:    Short of breath when lying flat:    Irregular heart rhythm:        Vascular    Pain in calf, thigh, or hip brought on by ambulation:    Pain in feet at night  that wakes you up from your sleep:     Blood clot in your veins:    Leg swelling:         Pulmonary    Oxygen at home:    Productive cough:     Wheezing:         Neurologic    Sudden weakness in arms or legs:     Sudden numbness in arms or legs:     Sudden onset of difficulty speaking or slurred speech:    Temporary loss of vision in one eye:     Problems with dizziness:         Gastrointestinal    Blood in stool:     Vomited blood:         Genitourinary    Burning when urinating:     Blood in urine:        Psychiatric    Major depression:         Hematologic    Bleeding problems:    Problems with blood clotting too easily:        Skin    Rashes or ulcers:        Constitutional    Fever or chills:      PHYSICAL EXAMINATION:  Vitals:   11/25/22 1358 11/25/22 1359 11/29/22 1150  BP:   (!) 170/49  Pulse:   67  Resp:   16  Temp:   98.7 F (37.1 C)  TempSrc:   Oral  SpO2:   98%  Weight:  40.8 kg 45.4 kg  Height: 4' 11.5" (1.511 m)  4' 11.5" (1.511 m)    General:  WDWN in NAD; vital signs documented above Gait: Not observed HENT: WNL, normocephalic Pulmonary: normal non-labored breathing , without Rales, rhonchi,  wheezing Cardiac: regular HR, Abdomen: soft, NT, no masses Skin: without rashes Vascular Exam/Pulses:  Right Left  Radial 2+ (normal) 2+ (normal)                       Extremities: without ischemic  changes, without Gangrene , without cellulitis; without open wounds;  Musculoskeletal: no muscle wasting or atrophy  Neurologic: A&O X 3;  No focal weakness or paresthesias are detected Psychiatric:  The pt has Normal affect.   Non-Invasive Vascular Imaging:   none    ASSESSMENT/PLAN:  LELIA LAMPINEN is a 72 y.o. female who presents with end stage renal disease in need of long-term HD access.  Michaelyn was initially hesitant to follow-up with our group due to her concern for post cannulation bleeding.  She stated she has seen significant bleeding at her dialysis unit which makes her nervous to have HD from a fistula or AV graft.  Bilateral upper extremity venous ultrasounds reviewed.  She appears to have adequate basilic vein in the right upper extremity.  We discussed the risk and benefits of brachiobasilic fistula creation as well as subsequent transposition versus left-sided AV graft.  She is aware that there is a small chance that the left-sided basilic vein may also be usable.  After discussing risk and benefits, Linden asked for left-sided access due to being right handed.   Will work to schedule    Sheri Sparrow, MD [facility] [phone] Total time of visit including imaging review and documentation 11 minutes  11/29/2022, 12:53 PM

## 2022-11-30 ENCOUNTER — Encounter (HOSPITAL_COMMUNITY): Payer: Self-pay | Admitting: Vascular Surgery

## 2022-11-30 DIAGNOSIS — N186 End stage renal disease: Secondary | ICD-10-CM | POA: Diagnosis not present

## 2022-11-30 DIAGNOSIS — Z992 Dependence on renal dialysis: Secondary | ICD-10-CM | POA: Diagnosis not present

## 2022-12-01 DIAGNOSIS — N186 End stage renal disease: Secondary | ICD-10-CM | POA: Diagnosis not present

## 2022-12-01 DIAGNOSIS — Z992 Dependence on renal dialysis: Secondary | ICD-10-CM | POA: Diagnosis not present

## 2022-12-01 NOTE — Anesthesia Postprocedure Evaluation (Signed)
Anesthesia Post Note  Patient: Dorna S Whisner  Procedure(s) Performed: INSERTION OF LEFT UPPER  ARTERIOVENOUS (AV) GORE-TEX GRAFT ARM (Left: Arm Upper)     Patient location during evaluation: PACU Anesthesia Type: General Level of consciousness: awake and alert Pain management: pain level controlled Vital Signs Assessment: post-procedure vital signs reviewed and stable Respiratory status: spontaneous breathing, nonlabored ventilation, respiratory function stable and patient connected to nasal cannula oxygen Cardiovascular status: blood pressure returned to baseline and stable Postop Assessment: no apparent nausea or vomiting Anesthetic complications: no   No notable events documented.  Last Vitals:  Vitals:   11/29/22 1515 11/29/22 1530  BP: (!) 133/54 (!) 145/54  Pulse: 74 79  Resp: 16 17  Temp:  36.6 C  SpO2: 100% 100%    Last Pain:  Vitals:   11/29/22 1530  TempSrc:   PainSc: 4                  Sueanne Maniaci

## 2022-12-02 DIAGNOSIS — N186 End stage renal disease: Secondary | ICD-10-CM | POA: Diagnosis not present

## 2022-12-02 DIAGNOSIS — Z992 Dependence on renal dialysis: Secondary | ICD-10-CM | POA: Diagnosis not present

## 2022-12-04 DIAGNOSIS — N186 End stage renal disease: Secondary | ICD-10-CM | POA: Diagnosis not present

## 2022-12-04 DIAGNOSIS — Z992 Dependence on renal dialysis: Secondary | ICD-10-CM | POA: Diagnosis not present

## 2022-12-07 DIAGNOSIS — N186 End stage renal disease: Secondary | ICD-10-CM | POA: Diagnosis not present

## 2022-12-07 DIAGNOSIS — Z992 Dependence on renal dialysis: Secondary | ICD-10-CM | POA: Diagnosis not present

## 2022-12-09 DIAGNOSIS — Z992 Dependence on renal dialysis: Secondary | ICD-10-CM | POA: Diagnosis not present

## 2022-12-09 DIAGNOSIS — N186 End stage renal disease: Secondary | ICD-10-CM | POA: Diagnosis not present

## 2022-12-11 DIAGNOSIS — Z992 Dependence on renal dialysis: Secondary | ICD-10-CM | POA: Diagnosis not present

## 2022-12-11 DIAGNOSIS — N186 End stage renal disease: Secondary | ICD-10-CM | POA: Diagnosis not present

## 2022-12-14 DIAGNOSIS — Z992 Dependence on renal dialysis: Secondary | ICD-10-CM | POA: Diagnosis not present

## 2022-12-14 DIAGNOSIS — N186 End stage renal disease: Secondary | ICD-10-CM | POA: Diagnosis not present

## 2022-12-16 DIAGNOSIS — N186 End stage renal disease: Secondary | ICD-10-CM | POA: Diagnosis not present

## 2022-12-16 DIAGNOSIS — Z992 Dependence on renal dialysis: Secondary | ICD-10-CM | POA: Diagnosis not present

## 2022-12-18 DIAGNOSIS — N186 End stage renal disease: Secondary | ICD-10-CM | POA: Diagnosis not present

## 2022-12-18 DIAGNOSIS — Z992 Dependence on renal dialysis: Secondary | ICD-10-CM | POA: Diagnosis not present

## 2022-12-21 DIAGNOSIS — Z992 Dependence on renal dialysis: Secondary | ICD-10-CM | POA: Diagnosis not present

## 2022-12-21 DIAGNOSIS — N186 End stage renal disease: Secondary | ICD-10-CM | POA: Diagnosis not present

## 2022-12-23 DIAGNOSIS — N186 End stage renal disease: Secondary | ICD-10-CM | POA: Diagnosis not present

## 2022-12-23 DIAGNOSIS — Z992 Dependence on renal dialysis: Secondary | ICD-10-CM | POA: Diagnosis not present

## 2022-12-25 DIAGNOSIS — N186 End stage renal disease: Secondary | ICD-10-CM | POA: Diagnosis not present

## 2022-12-25 DIAGNOSIS — Z992 Dependence on renal dialysis: Secondary | ICD-10-CM | POA: Diagnosis not present

## 2022-12-27 DIAGNOSIS — Z515 Encounter for palliative care: Secondary | ICD-10-CM | POA: Diagnosis not present

## 2022-12-27 DIAGNOSIS — Z992 Dependence on renal dialysis: Secondary | ICD-10-CM | POA: Diagnosis not present

## 2022-12-27 DIAGNOSIS — N186 End stage renal disease: Secondary | ICD-10-CM | POA: Diagnosis not present

## 2022-12-27 DIAGNOSIS — J449 Chronic obstructive pulmonary disease, unspecified: Secondary | ICD-10-CM | POA: Diagnosis not present

## 2022-12-28 DIAGNOSIS — Z992 Dependence on renal dialysis: Secondary | ICD-10-CM | POA: Diagnosis not present

## 2022-12-28 DIAGNOSIS — N186 End stage renal disease: Secondary | ICD-10-CM | POA: Diagnosis not present

## 2022-12-30 DIAGNOSIS — N186 End stage renal disease: Secondary | ICD-10-CM | POA: Diagnosis not present

## 2022-12-30 DIAGNOSIS — Z992 Dependence on renal dialysis: Secondary | ICD-10-CM | POA: Diagnosis not present

## 2023-01-01 DIAGNOSIS — Z992 Dependence on renal dialysis: Secondary | ICD-10-CM | POA: Diagnosis not present

## 2023-01-01 DIAGNOSIS — N186 End stage renal disease: Secondary | ICD-10-CM | POA: Diagnosis not present

## 2023-01-04 DIAGNOSIS — N186 End stage renal disease: Secondary | ICD-10-CM | POA: Diagnosis not present

## 2023-01-04 DIAGNOSIS — Z992 Dependence on renal dialysis: Secondary | ICD-10-CM | POA: Diagnosis not present

## 2023-01-06 DIAGNOSIS — N186 End stage renal disease: Secondary | ICD-10-CM | POA: Diagnosis not present

## 2023-01-06 DIAGNOSIS — Z992 Dependence on renal dialysis: Secondary | ICD-10-CM | POA: Diagnosis not present

## 2023-01-07 ENCOUNTER — Ambulatory Visit (INDEPENDENT_AMBULATORY_CARE_PROVIDER_SITE_OTHER): Payer: Medicare Other | Admitting: Physician Assistant

## 2023-01-07 VITALS — BP 135/66 | HR 84 | Temp 98.2°F | Wt 92.0 lb

## 2023-01-07 DIAGNOSIS — N186 End stage renal disease: Secondary | ICD-10-CM

## 2023-01-07 NOTE — Progress Notes (Signed)
  POST OPERATIVE OFFICE NOTE    CC:  F/u for surgery  HPI:  Sheri Cooper is a 72 y.o. female who is s/p creation of left upper arm AV graft on 11/29/2022 by Dr.Robins. This was done for permanent dialysis access.  Pt returns today for follow up.  Pt states she is doing well. She states her left arm incisions are fully healed without erythema or drainage.  She denies any symptoms of steal such as left hand pain, weakness, or excessive coldness.  She had some issues with left hand numbness for 1 week after surgery, but this is completely resolved.  She dialyzes via right internal jugular TDC on Tuesdays, Thursdays, and Saturdays.   No Known Allergies  Current Outpatient Medications  Medication Sig Dispense Refill   amLODipine (NORVASC) 10 MG tablet Take 10 mg by mouth daily.     atorvastatin (LIPITOR) 40 MG tablet Take 40 mg by mouth at bedtime.     furosemide (LASIX) 80 MG tablet Take 80 mg by mouth daily.     hydrALAZINE (APRESOLINE) 50 MG tablet Take 50 mg by mouth 2 (two) times daily.     metoprolol succinate (TOPROL-XL) 25 MG 24 hr tablet Take 25 mg by mouth daily.     multivitamin (RENA-VIT) TABS tablet Take 1 tablet by mouth daily.     pantoprazole (PROTONIX) 40 MG tablet Take 1 tablet (40 mg total) by mouth 2 (two) times daily. 60 tablet 1   sevelamer carbonate (RENVELA) 800 MG tablet Take 800 mg by mouth 3 (three) times daily with meals.     sertraline (ZOLOFT) 50 MG tablet Take 50 mg by mouth daily.     No current facility-administered medications for this visit.     ROS:  See HPI  Physical Exam:  Incision:  left arm incisions well healed Extremities:  palpable left radial pulse. Left upper arm graft with great thrill on palpation Neuro: intact motor and sensation of LUE    Assessment/Plan:  This is a 72 y.o. female who is here for a post op visit  -The patient's left upper arm incisions are well-healed without signs of infection -On exam she has a palpable left  radial pulse.  Her left upper arm graft has a great thrill and palpation and good bruit and auscultation -She had some issues with numbness in left hand for about a week after surgery, but this is now resolved.  She denies any symptoms of steal such as left hand pain, coldness, or weakness. -I have explained to the patient the first time dialysis can use the graft is next Tuesday, 9/10. If her graft works well for at least 2-3 dialysis sessions without issues, her [facility] can be removed.  -She can follow up with our office as needed   Loel Dubonnet, PA-C [facility] [phone]   Clinic MD:  Karin Lieu

## 2023-01-08 DIAGNOSIS — N186 End stage renal disease: Secondary | ICD-10-CM | POA: Diagnosis not present

## 2023-01-08 DIAGNOSIS — Z992 Dependence on renal dialysis: Secondary | ICD-10-CM | POA: Diagnosis not present

## 2023-01-11 DIAGNOSIS — Z992 Dependence on renal dialysis: Secondary | ICD-10-CM | POA: Diagnosis not present

## 2023-01-11 DIAGNOSIS — N186 End stage renal disease: Secondary | ICD-10-CM | POA: Diagnosis not present

## 2023-01-13 DIAGNOSIS — Z992 Dependence on renal dialysis: Secondary | ICD-10-CM | POA: Diagnosis not present

## 2023-01-13 DIAGNOSIS — N186 End stage renal disease: Secondary | ICD-10-CM | POA: Diagnosis not present

## 2023-01-15 DIAGNOSIS — N186 End stage renal disease: Secondary | ICD-10-CM | POA: Diagnosis not present

## 2023-01-15 DIAGNOSIS — Z992 Dependence on renal dialysis: Secondary | ICD-10-CM | POA: Diagnosis not present

## 2023-01-18 DIAGNOSIS — N186 End stage renal disease: Secondary | ICD-10-CM | POA: Diagnosis not present

## 2023-01-18 DIAGNOSIS — Z992 Dependence on renal dialysis: Secondary | ICD-10-CM | POA: Diagnosis not present

## 2023-01-20 DIAGNOSIS — J209 Acute bronchitis, unspecified: Secondary | ICD-10-CM | POA: Diagnosis not present

## 2023-01-20 DIAGNOSIS — Z20822 Contact with and (suspected) exposure to covid-19: Secondary | ICD-10-CM | POA: Diagnosis not present

## 2023-01-20 DIAGNOSIS — Z992 Dependence on renal dialysis: Secondary | ICD-10-CM | POA: Diagnosis not present

## 2023-01-20 DIAGNOSIS — N186 End stage renal disease: Secondary | ICD-10-CM | POA: Diagnosis not present

## 2023-01-22 DIAGNOSIS — Z992 Dependence on renal dialysis: Secondary | ICD-10-CM | POA: Diagnosis not present

## 2023-01-22 DIAGNOSIS — N186 End stage renal disease: Secondary | ICD-10-CM | POA: Diagnosis not present

## 2023-01-25 ENCOUNTER — Emergency Department (HOSPITAL_COMMUNITY): Payer: Medicare Other

## 2023-01-25 ENCOUNTER — Other Ambulatory Visit: Payer: Self-pay

## 2023-01-25 ENCOUNTER — Encounter (HOSPITAL_COMMUNITY): Payer: Self-pay | Admitting: Internal Medicine

## 2023-01-25 ENCOUNTER — Inpatient Hospital Stay (HOSPITAL_COMMUNITY)
Admission: EM | Admit: 2023-01-25 | Discharge: 2023-01-27 | DRG: 640 | Disposition: A | Discharge status: Home / Self Care | Payer: Medicare Other | Attending: Family Medicine | Admitting: Family Medicine

## 2023-01-25 DIAGNOSIS — E8770 Fluid overload, unspecified: Secondary | ICD-10-CM | POA: Diagnosis not present

## 2023-01-25 DIAGNOSIS — M06041 Rheumatoid arthritis without rheumatoid factor, right hand: Secondary | ICD-10-CM | POA: Diagnosis not present

## 2023-01-25 DIAGNOSIS — I120 Hypertensive chronic kidney disease with stage 5 chronic kidney disease or end stage renal disease: Secondary | ICD-10-CM | POA: Diagnosis present

## 2023-01-25 DIAGNOSIS — Z853 Personal history of malignant neoplasm of breast: Secondary | ICD-10-CM | POA: Diagnosis not present

## 2023-01-25 DIAGNOSIS — Z1152 Encounter for screening for COVID-19: Secondary | ICD-10-CM | POA: Diagnosis not present

## 2023-01-25 DIAGNOSIS — M898X9 Other specified disorders of bone, unspecified site: Secondary | ICD-10-CM | POA: Diagnosis present

## 2023-01-25 DIAGNOSIS — J811 Chronic pulmonary edema: Secondary | ICD-10-CM | POA: Diagnosis not present

## 2023-01-25 DIAGNOSIS — J90 Pleural effusion, not elsewhere classified: Secondary | ICD-10-CM | POA: Diagnosis present

## 2023-01-25 DIAGNOSIS — Z8711 Personal history of peptic ulcer disease: Secondary | ICD-10-CM | POA: Diagnosis not present

## 2023-01-25 DIAGNOSIS — N186 End stage renal disease: Principal | ICD-10-CM

## 2023-01-25 DIAGNOSIS — E782 Mixed hyperlipidemia: Secondary | ICD-10-CM | POA: Diagnosis not present

## 2023-01-25 DIAGNOSIS — E8779 Other fluid overload: Secondary | ICD-10-CM

## 2023-01-25 DIAGNOSIS — Z87891 Personal history of nicotine dependence: Secondary | ICD-10-CM

## 2023-01-25 DIAGNOSIS — J440 Chronic obstructive pulmonary disease with acute lower respiratory infection: Secondary | ICD-10-CM | POA: Diagnosis not present

## 2023-01-25 DIAGNOSIS — J181 Lobar pneumonia, unspecified organism: Secondary | ICD-10-CM | POA: Diagnosis present

## 2023-01-25 DIAGNOSIS — R918 Other nonspecific abnormal finding of lung field: Secondary | ICD-10-CM | POA: Diagnosis not present

## 2023-01-25 DIAGNOSIS — J810 Acute pulmonary edema: Secondary | ICD-10-CM | POA: Diagnosis present

## 2023-01-25 DIAGNOSIS — D631 Anemia in chronic kidney disease: Secondary | ICD-10-CM | POA: Diagnosis not present

## 2023-01-25 DIAGNOSIS — E876 Hypokalemia: Secondary | ICD-10-CM | POA: Diagnosis present

## 2023-01-25 DIAGNOSIS — M06042 Rheumatoid arthritis without rheumatoid factor, left hand: Secondary | ICD-10-CM | POA: Diagnosis not present

## 2023-01-25 DIAGNOSIS — Z79899 Other long term (current) drug therapy: Secondary | ICD-10-CM

## 2023-01-25 DIAGNOSIS — Z23 Encounter for immunization: Secondary | ICD-10-CM

## 2023-01-25 DIAGNOSIS — J441 Chronic obstructive pulmonary disease with (acute) exacerbation: Secondary | ICD-10-CM | POA: Diagnosis not present

## 2023-01-25 DIAGNOSIS — M069 Rheumatoid arthritis, unspecified: Secondary | ICD-10-CM | POA: Diagnosis present

## 2023-01-25 DIAGNOSIS — Z992 Dependence on renal dialysis: Secondary | ICD-10-CM

## 2023-01-25 DIAGNOSIS — Z7952 Long term (current) use of systemic steroids: Secondary | ICD-10-CM

## 2023-01-25 DIAGNOSIS — J189 Pneumonia, unspecified organism: Secondary | ICD-10-CM

## 2023-01-25 DIAGNOSIS — J9601 Acute respiratory failure with hypoxia: Secondary | ICD-10-CM | POA: Diagnosis present

## 2023-01-25 DIAGNOSIS — R0989 Other specified symptoms and signs involving the circulatory and respiratory systems: Secondary | ICD-10-CM | POA: Diagnosis not present

## 2023-01-25 DIAGNOSIS — I10 Essential (primary) hypertension: Secondary | ICD-10-CM | POA: Diagnosis present

## 2023-01-25 LAB — BASIC METABOLIC PANEL
Anion gap: 14 (ref 5–15)
BUN: 74 mg/dL — ABNORMAL HIGH (ref 8–23)
CO2: 20 mmol/L — ABNORMAL LOW (ref 22–32)
Calcium: 8.9 mg/dL (ref 8.9–10.3)
Chloride: 109 mmol/L (ref 98–111)
Creatinine, Ser: 5.78 mg/dL — ABNORMAL HIGH (ref 0.44–1.00)
GFR, Estimated: 7 mL/min — ABNORMAL LOW (ref >60)
Glucose, Bld: 130 mg/dL — ABNORMAL HIGH (ref 70–99)
Potassium: 3.4 mmol/L — ABNORMAL LOW (ref 3.5–5.1)
Sodium: 143 mmol/L (ref 135–145)

## 2023-01-25 LAB — CBC WITH DIFFERENTIAL/PLATELET
Abs Immature Granulocytes: 0.28 10*3/uL — ABNORMAL HIGH (ref 0.00–0.07)
Basophils Absolute: 0.1 10*3/uL (ref 0.0–0.1)
Basophils Relative: 1 %
Eosinophils Absolute: 0.1 10*3/uL (ref 0.0–0.5)
Eosinophils Relative: 1 %
HCT: 36.7 % (ref 36.0–46.0)
Hemoglobin: 12 g/dL (ref 12.0–15.0)
Immature Granulocytes: 2 %
Lymphocytes Relative: 23 %
Lymphs Abs: 3.2 10*3/uL (ref 0.7–4.0)
MCH: 27.3 pg (ref 26.0–34.0)
MCHC: 32.7 g/dL (ref 30.0–36.0)
MCV: 83.6 fL (ref 80.0–100.0)
Monocytes Absolute: 1 10*3/uL (ref 0.1–1.0)
Monocytes Relative: 7 %
Neutro Abs: 9.3 10*3/uL — ABNORMAL HIGH (ref 1.7–7.7)
Neutrophils Relative %: 66 %
Platelets: 381 10*3/uL (ref 150–400)
RBC: 4.39 MIL/uL (ref 3.87–5.11)
RDW: 18.7 % — ABNORMAL HIGH (ref 11.5–15.5)
WBC: 14 10*3/uL — ABNORMAL HIGH (ref 4.0–10.5)
nRBC: 0 % (ref 0.0–0.2)

## 2023-01-25 LAB — PROCALCITONIN: Procalcitonin: 1.41 ng/mL

## 2023-01-25 LAB — SARS CORONAVIRUS 2 BY RT PCR: SARS Coronavirus 2 by RT PCR: NEGATIVE

## 2023-01-25 LAB — BRAIN NATRIURETIC PEPTIDE: B Natriuretic Peptide: 2267 pg/mL — ABNORMAL HIGH (ref 0.0–100.0)

## 2023-01-25 LAB — TROPONIN I (HIGH SENSITIVITY)
Troponin I (High Sensitivity): 16 ng/L (ref <18)
Troponin I (High Sensitivity): 16 ng/L (ref <18)

## 2023-01-25 MED ORDER — ARFORMOTEROL TARTRATE 15 MCG/2ML IN NEBU
15.0000 ug | INHALATION_SOLUTION | Freq: Two times a day (BID) | RESPIRATORY_TRACT | Status: DC
  Administered 2023-01-26 – 2023-01-27 (×3): 15 ug via RESPIRATORY_TRACT
  Filled 2023-01-25 (×4): qty 2

## 2023-01-25 MED ORDER — ALTEPLASE 2 MG IJ SOLR: 2.0000 mg | Freq: Once | INTRAMUSCULAR | Status: DC | PRN

## 2023-01-25 MED ORDER — DOXYCYCLINE HYCLATE 100 MG PO TABS
100.0000 mg | ORAL_TABLET | Freq: Two times a day (BID) | ORAL | Status: DC
  Administered 2023-01-25 – 2023-01-26 (×2): 100 mg via ORAL
  Filled 2023-01-25 (×2): qty 1

## 2023-01-25 MED ORDER — VANCOMYCIN HCL IN DEXTROSE 1-5 GM/200ML-% IV SOLN
1000.0000 mg | Freq: Once | INTRAVENOUS | Status: AC
  Administered 2023-01-25: 1000 mg via INTRAVENOUS
  Filled 2023-01-25 (×2): qty 200

## 2023-01-25 MED ORDER — CHLORHEXIDINE GLUCONATE CLOTH 2 % EX PADS
6.0000 | MEDICATED_PAD | Freq: Every day | CUTANEOUS | Status: DC
  Administered 2023-01-25 – 2023-01-27 (×3): 6 via TOPICAL

## 2023-01-25 MED ORDER — AMLODIPINE BESYLATE 5 MG PO TABS
10.0000 mg | ORAL_TABLET | Freq: Every day | ORAL | Status: DC
  Administered 2023-01-26: 10 mg via ORAL
  Filled 2023-01-25 (×2): qty 2

## 2023-01-25 MED ORDER — ALBUTEROL SULFATE (2.5 MG/3ML) 0.083% IN NEBU
2.5000 mg | INHALATION_SOLUTION | Freq: Once | RESPIRATORY_TRACT | Status: AC
  Administered 2023-01-25: 2.5 mg via RESPIRATORY_TRACT
  Filled 2023-01-25: qty 3

## 2023-01-25 MED ORDER — LIDOCAINE-PRILOCAINE 2.5-2.5 % EX CREA: 1.0000 | TOPICAL_CREAM | CUTANEOUS | Status: DC | PRN

## 2023-01-25 MED ORDER — PROCHLORPERAZINE EDISYLATE 10 MG/2ML IJ SOLN: 10.0000 mg | Freq: Four times a day (QID) | INTRAMUSCULAR | Status: DC | PRN

## 2023-01-25 MED ORDER — CALCITRIOL 0.25 MCG PO CAPS
0.7500 ug | ORAL_CAPSULE | ORAL | Status: DC
  Administered 2023-01-25 – 2023-01-27 (×2): 0.75 ug via ORAL
  Filled 2023-01-25 (×2): qty 3

## 2023-01-25 MED ORDER — IPRATROPIUM-ALBUTEROL 0.5-2.5 (3) MG/3ML IN SOLN
3.0000 mL | Freq: Once | RESPIRATORY_TRACT | Status: AC
  Administered 2023-01-25: 3 mL via RESPIRATORY_TRACT
  Filled 2023-01-25: qty 3

## 2023-01-25 MED ORDER — SODIUM CHLORIDE 0.9 % IV SOLN
1.0000 g | INTRAVENOUS | Status: DC
  Filled 2023-01-25: qty 10

## 2023-01-25 MED ORDER — PENTAFLUOROPROP-TETRAFLUOROETH EX AERO: 1.0000 | INHALATION_SPRAY | CUTANEOUS | Status: DC | PRN

## 2023-01-25 MED ORDER — SEVELAMER CARBONATE 800 MG PO TABS
800.0000 mg | ORAL_TABLET | Freq: Three times a day (TID) | ORAL | Status: DC
  Administered 2023-01-26 – 2023-01-27 (×6): 800 mg via ORAL
  Filled 2023-01-25 (×6): qty 1

## 2023-01-25 MED ORDER — PNEUMOCOCCAL 20-VAL CONJ VACC 0.5 ML IM SUSY
0.5000 mL | PREFILLED_SYRINGE | INTRAMUSCULAR | Status: AC
  Administered 2023-01-26: 0.5 mL via INTRAMUSCULAR
  Filled 2023-01-25: qty 0.5

## 2023-01-25 MED ORDER — ATORVASTATIN CALCIUM 40 MG PO TABS
40.0000 mg | ORAL_TABLET | Freq: Every day | ORAL | Status: DC
  Administered 2023-01-25 – 2023-01-26 (×2): 40 mg via ORAL
  Filled 2023-01-25 (×2): qty 1

## 2023-01-25 MED ORDER — BUDESONIDE 0.5 MG/2ML IN SUSP
0.5000 mg | Freq: Two times a day (BID) | RESPIRATORY_TRACT | Status: DC
  Administered 2023-01-26 – 2023-01-27 (×3): 0.5 mg via RESPIRATORY_TRACT
  Filled 2023-01-25 (×4): qty 2

## 2023-01-25 MED ORDER — PANTOPRAZOLE SODIUM 40 MG PO TBEC
40.0000 mg | DELAYED_RELEASE_TABLET | Freq: Two times a day (BID) | ORAL | Status: DC
  Administered 2023-01-25 – 2023-01-27 (×5): 40 mg via ORAL
  Filled 2023-01-25 (×5): qty 1

## 2023-01-25 MED ORDER — HEPARIN SODIUM (PORCINE) 1000 UNIT/ML DIALYSIS
1000.0000 [IU] | INTRAMUSCULAR | Status: DC | PRN
  Administered 2023-01-25: 3200 [IU]

## 2023-01-25 MED ORDER — SERTRALINE HCL 50 MG PO TABS
50.0000 mg | ORAL_TABLET | Freq: Every day | ORAL | Status: DC
  Administered 2023-01-26 – 2023-01-27 (×2): 50 mg via ORAL
  Filled 2023-01-25 (×3): qty 1

## 2023-01-25 MED ORDER — FUROSEMIDE 40 MG PO TABS
80.0000 mg | ORAL_TABLET | Freq: Every day | ORAL | Status: DC
  Administered 2023-01-25 – 2023-01-27 (×3): 80 mg via ORAL
  Filled 2023-01-25 (×3): qty 2

## 2023-01-25 MED ORDER — HYDRALAZINE HCL 25 MG PO TABS
50.0000 mg | ORAL_TABLET | Freq: Two times a day (BID) | ORAL | Status: DC
  Administered 2023-01-25 – 2023-01-26 (×3): 50 mg via ORAL
  Filled 2023-01-25 (×4): qty 2

## 2023-01-25 MED ORDER — METHYLPREDNISOLONE SODIUM SUCC 125 MG IJ SOLR
125.0000 mg | Freq: Once | INTRAMUSCULAR | Status: AC
  Administered 2023-01-25: 125 mg via INTRAVENOUS
  Filled 2023-01-25: qty 2

## 2023-01-25 MED ORDER — HEPARIN SODIUM (PORCINE) 1000 UNIT/ML IJ SOLN
INTRAMUSCULAR | Status: AC
  Filled 2023-01-25: qty 4

## 2023-01-25 MED ORDER — HEPARIN SODIUM (PORCINE) 5000 UNIT/ML IJ SOLN
5000.0000 [IU] | Freq: Three times a day (TID) | INTRAMUSCULAR | Status: DC
  Administered 2023-01-25 – 2023-01-27 (×5): 5000 [IU] via SUBCUTANEOUS
  Filled 2023-01-25 (×5): qty 1

## 2023-01-25 MED ORDER — SODIUM CHLORIDE 0.9 % IV SOLN
1.0000 g | Freq: Once | INTRAVENOUS | Status: AC
  Administered 2023-01-25: 1 g via INTRAVENOUS
  Filled 2023-01-25: qty 10

## 2023-01-25 MED ORDER — LIDOCAINE HCL (PF) 1 % IJ SOLN: 5.0000 mL | INTRAMUSCULAR | Status: DC | PRN

## 2023-01-25 MED ORDER — DOXYCYCLINE HYCLATE 100 MG PO TABS
100.0000 mg | ORAL_TABLET | Freq: Once | ORAL | Status: AC
  Administered 2023-01-25: 100 mg via ORAL
  Filled 2023-01-25: qty 1

## 2023-01-25 MED ORDER — VANCOMYCIN HCL 500 MG/100ML IV SOLN
500.0000 mg | INTRAVENOUS | Status: DC
  Administered 2023-01-27: 500 mg via INTRAVENOUS
  Filled 2023-01-25: qty 100

## 2023-01-25 MED ORDER — ACETAMINOPHEN 325 MG PO TABS
650.0000 mg | ORAL_TABLET | Freq: Four times a day (QID) | ORAL | Status: DC | PRN
  Administered 2023-01-27: 650 mg via ORAL
  Filled 2023-01-25: qty 2

## 2023-01-25 MED ORDER — METHYLPREDNISOLONE SODIUM SUCC 125 MG IJ SOLR
60.0000 mg | Freq: Two times a day (BID) | INTRAMUSCULAR | Status: DC
  Administered 2023-01-26 (×2): 60 mg via INTRAVENOUS
  Filled 2023-01-25 (×2): qty 2

## 2023-01-25 MED ORDER — SODIUM CHLORIDE 0.9 % IV SOLN
2.0000 g | Freq: Once | INTRAVENOUS | Status: AC
  Administered 2023-01-25: 2 g via INTRAVENOUS
  Filled 2023-01-25: qty 12.5

## 2023-01-25 MED ORDER — IPRATROPIUM-ALBUTEROL 0.5-2.5 (3) MG/3ML IN SOLN
3.0000 mL | Freq: Four times a day (QID) | RESPIRATORY_TRACT | Status: DC
  Administered 2023-01-25 – 2023-01-26 (×3): 3 mL via RESPIRATORY_TRACT
  Filled 2023-01-25 (×3): qty 3

## 2023-01-25 MED ORDER — ACETAMINOPHEN 650 MG RE SUPP: 650.0000 mg | Freq: Four times a day (QID) | RECTAL | Status: DC | PRN

## 2023-01-25 MED ORDER — METOPROLOL SUCCINATE ER 25 MG PO TB24
25.0000 mg | ORAL_TABLET | Freq: Every day | ORAL | Status: DC
  Administered 2023-01-26: 25 mg via ORAL
  Filled 2023-01-25 (×2): qty 1

## 2023-01-25 MED ORDER — HYDRALAZINE HCL 20 MG/ML IJ SOLN
10.0000 mg | Freq: Once | INTRAMUSCULAR | Status: AC
  Administered 2023-01-25: 10 mg via INTRAVENOUS
  Filled 2023-01-25: qty 1

## 2023-01-25 MED ORDER — RENA-VITE PO TABS
1.0000 | ORAL_TABLET | Freq: Every day | ORAL | Status: DC
  Administered 2023-01-25 – 2023-01-27 (×3): 1 via ORAL
  Filled 2023-01-25 (×3): qty 1

## 2023-01-25 NOTE — ED Provider Notes (Signed)
[place] [facility]ERGENCY DEPARTMENT AT [facility] Provider Note   CSN: [id number] Arrival date & time: 01/25/23  4098     History  Chief Complaint  Patient presents with   Respiratory Distress    Sheri Cooper is a 72 y.o. female.  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  Last dialysis was Saturday.  Saturday she was beginning to get short of breath with a nonproductive cough.  Went to urgent care had a negative COVID swab and they started her on doxycycline prednisone Tessalon Perles and an inhaler.  She said she has had no relief and her symptoms are getting worse.  She is due to get dialysis again today.  Non-smoker.  No fevers or chills.  She said she gets pneumonia fairly frequently.  Also has a history of breast cancer.  Brother sick with cough.  The history is provided by the patient.  Shortness of Breath Severity:  Moderate Onset quality:  Gradual Duration:  4 days Timing:  Constant Progression:  Worsening Chronicity:  Recurrent Relieved by:  Nothing Worsened by:  Activity and coughing Ineffective treatments:  Inhaler Associated symptoms: cough   Associated symptoms: no abdominal pain, no chest pain, no fever, no hemoptysis, no sputum production and no vomiting   Risk factors: no tobacco use        Home Medications Prior to Admission medications   Medication Sig Start Date End Date Taking? Authorizing Provider  amLODipine (NORVASC) 10 MG tablet Take 10 mg by mouth daily.    [provider]  atorvastatin (LIPITOR) 40 MG tablet Take 40 mg by mouth at bedtime. 09/22/22   [provider]  furosemide (LASIX) 80 MG tablet Take 80 mg by mouth daily.    [provider]  hydrALAZINE (APRESOLINE) 50 MG tablet Take 50 mg by mouth 2 (two) times daily. 07/14/22   [provider]  metoprolol succinate (TOPROL-XL) 25 MG 24 hr tablet Take 25 mg by mouth daily. 10/15/22   [provider]  multivitamin (RENA-VIT) TABS tablet Take 1 tablet by mouth daily. 10/15/22   [provider]  pantoprazole (PROTONIX) 40 MG tablet Take 1 tablet (40 mg total) by mouth 2 (two) times daily. 06/05/22 06/05/23  Vassie Loll, MD  sertraline (ZOLOFT) 50 MG tablet Take 50 mg by mouth daily. 05/12/22 11/29/22  [provider]  sevelamer carbonate (RENVELA) 800 MG tablet Take 800 mg by mouth 3 (three) times daily with meals. 11/23/22   [provider]      Allergies    Patient has no known allergies.    Review of Systems   Review of Systems  Constitutional:  Negative for fever.  Respiratory:  Positive for cough and shortness of breath. Negative for hemoptysis and sputum production.   Cardiovascular:  Negative for chest pain.  Gastrointestinal:  Negative for abdominal pain and vomiting.    Physical Exam Updated Vital Signs BP (!) 175/56 (BP Location: Right Arm)   Pulse 65   Temp 98.5 F (36.9 C) (Oral)   Resp (!) 30   Ht 4' 11.5" (1.511 m)   Wt 44.5 kg   SpO2 95%   BMI 19.46 kg/m  Physical Exam Vitals and nursing note reviewed.  Constitutional:      General: She is not in acute distress.    Appearance: Normal appearance. She is well-developed.  HENT:     Head: Normocephalic and atraumatic.  Eyes:     Conjunctiva/sclera:  Conjunctivae normal.  Cardiovascular:     Rate and Rhythm: Normal rate and regular rhythm.     Heart sounds: No murmur heard. Pulmonary:     Effort: Tachypnea and accessory muscle usage present. No respiratory distress.     Breath sounds: Normal breath sounds. No wheezing or rhonchi.  Abdominal:     Palpations: Abdomen is soft.     Tenderness: There is no abdominal tenderness.  Musculoskeletal:        General: No swelling.     Cervical back: Neck supple.  Skin:    General: Skin is warm and dry.     Capillary Refill: Capillary refill takes less than 2 seconds.  Neurological:     Mental Status: She is alert.  Psychiatric:         Mood and Affect: Mood normal.     ED Results / Procedures / Treatments   Labs (all labs ordered are listed, but only abnormal results are displayed) Labs Reviewed  BASIC METABOLIC PANEL - Abnormal; Notable for the following components:      Result Value   Potassium 3.4 (*)    CO2 20 (*)    Glucose, Bld 130 (*)    BUN 74 (*)    Creatinine, Ser 5.78 (*)    GFR, Estimated 7 (*)    All other components within normal limits  BRAIN NATRIURETIC PEPTIDE - Abnormal; Notable for the following components:   B Natriuretic Peptide 2,267.0 (*)    All other components within normal limits  CBC WITH DIFFERENTIAL/PLATELET - Abnormal; Notable for the following components:   WBC 14.0 (*)    RDW 18.7 (*)    Neutro Abs 9.3 (*)    Abs Immature Granulocytes 0.28 (*)    All other components within normal limits  SARS CORONAVIRUS 2 BY RT PCR  MRSA NEXT GEN BY PCR, NASAL  HEPATITIS B SURFACE ANTIBODY, QUANTITATIVE  HEPATITIS B SURFACE ANTIGEN  PROCALCITONIN  COMPREHENSIVE METABOLIC PANEL  CBC  TROPONIN I (HIGH SENSITIVITY)  TROPONIN I (HIGH SENSITIVITY)    EKG EKG Interpretation Date/Time:  Tuesday January 25 2023 10:02:28 EDT Ventricular Rate:  75 PR Interval:  110 QRS Duration:  113 QT Interval:  442 QTC Calculation: 484 R Axis:   66  Text Interpretation: Sinus rhythm Atrial premature complex Borderline short PR interval Borderline intraventricular conduction delay Borderline ST depression, lateral leads Minimal ST elevation, inferior leads Artifact in lead(s) V4 No significant change since prior 5/24 Confirmed by Meridee Score [phone]) on 01/25/2023 10:13:45 AM  Radiology DG Chest Port 1 View  Result Date: 01/25/2023 CLINICAL DATA:  Provided history: Shortness of breath. EXAM: PORTABLE CHEST 1 VIEW COMPARISON:  09/07/2022. Prior chest radiographs 09/07/2022 and earlier. FINDINGS: Right-sided dual lumen central venous catheter with tip at the level of the right atrium. Mild  cardiomegaly. Aortic atherosclerosis. Central pulmonary vascular congestion. Mild prominence of the intra lung markings suggesting interstitial edema. Small mild pleural effusions. Additional opacities at the bilateral lung bases which may reflect atelectasis and/or airspace consolidation. No evidence of pneumothorax. No acute osseous abnormality identified. IMPRESSION: 1. Cardiomegaly with pulmonary vascular congestion/interstitial edema. 2. Small bilateral pleural effusions. 3. Additional opacities at the bilateral lung bases, which may reflect atelectasis and/or airspace consolidation. 4. Aortic Atherosclerosis (ICD10-I70.0). Electronically Signed   By: Jackey Loge D.O.   On: 01/25/2023 12:32    Procedures Procedures    Medications Ordered in ED Medications  Chlorhexidine Gluconate Cloth 2 % PADS 6 each (6 each Topical Given  01/25/23 1704)  calcitRIOL (ROCALTROL) capsule 0.75 mcg (0.75 mcg Oral Given 01/25/23 1319)  amLODipine (NORVASC) tablet 10 mg (10 mg Oral Patient Refused/Not Given 01/25/23 1600)  atorvastatin (LIPITOR) tablet 40 mg (has no administration in time range)  furosemide (LASIX) tablet 80 mg (80 mg Oral Given 01/25/23 1605)  hydrALAZINE (APRESOLINE) tablet 50 mg (50 mg Oral Not Given 01/25/23 1705)  metoprolol succinate (TOPROL-XL) 24 hr tablet 25 mg (25 mg Oral Patient Refused/Not Given 01/25/23 1602)  sertraline (ZOLOFT) tablet 50 mg (50 mg Oral Not Given 01/25/23 1603)  pantoprazole (PROTONIX) EC tablet 40 mg (40 mg Oral Given 01/25/23 1604)  sevelamer carbonate (RENVELA) tablet 800 mg (has no administration in time range)  multivitamin (RENA-VIT) tablet 1 tablet (1 tablet Oral Given 01/25/23 1604)  heparin injection 5,000 Units (has no administration in time range)  acetaminophen (TYLENOL) tablet 650 mg (has no administration in time range)    Or  acetaminophen (TYLENOL) suppository 650 mg (has no administration in time range)  prochlorperazine (COMPAZINE) injection 10 mg (has  no administration in time range)  arformoterol (BROVANA) nebulizer solution 15 mcg (has no administration in time range)  budesonide (PULMICORT) nebulizer solution 0.5 mg (has no administration in time range)  ipratropium-albuterol (DUONEB) 0.5-2.5 (3) MG/3ML nebulizer solution 3 mL (3 mLs Nebulization Given 01/25/23 1624)  methylPREDNISolone sodium succinate (SOLU-MEDROL) 125 mg/2 mL injection 60 mg (has no administration in time range)  doxycycline (VIBRA-TABS) tablet 100 mg (has no administration in time range)  pneumococcal 20-valent conjugate vaccine (PREVNAR 20) injection 0.5 mL (has no administration in time range)  vancomycin (VANCOCIN) IVPB 1000 mg/200 mL premix (has no administration in time range)  ceFEPIme (MAXIPIME) 1 g in sodium chloride 0.9 % 100 mL IVPB (has no administration in time range)  vancomycin (VANCOREADY) IVPB 500 mg/100 mL (has no administration in time range)  ipratropium-albuterol (DUONEB) 0.5-2.5 (3) MG/3ML nebulizer solution 3 mL (3 mLs Nebulization Given 01/25/23 1012)  albuterol (PROVENTIL) (2.5 MG/3ML) 0.083% nebulizer solution 2.5 mg (2.5 mg Nebulization Given 01/25/23 1020)  cefTRIAXone (ROCEPHIN) 1 g in sodium chloride 0.9 % 100 mL IVPB (0 g Intravenous Stopped 01/25/23 1349)  doxycycline (VIBRA-TABS) tablet 100 mg (100 mg Oral Given 01/25/23 1320)  methylPREDNISolone sodium succinate (SOLU-MEDROL) 125 mg/2 mL injection 125 mg (125 mg Intravenous Given 01/25/23 1550)  hydrALAZINE (APRESOLINE) injection 10 mg (10 mg Intravenous Given 01/25/23 1552)  ceFEPIme (MAXIPIME) 2 g in sodium chloride 0.9 % 100 mL IVPB (0 g Intravenous Stopped 01/25/23 1642)    ED Course/ Medical Decision Making/ A&P Clinical Course as of 01/25/23 1717  Tue Jan 25, 2023  1035 Chest x-ray showing increased vascular congestion possible right lower lobe infiltrate.  Awaiting radiology reading. [MB]  1303 Discussed with Dr. Arbutus Leas [facility] hospitalist who will evaluate patient for admission. [MB]     Clinical Course User Index [MB] Terrilee Files, MD                                 Medical Decision Making Amount and/or Complexity of Data Reviewed Labs: ordered. Radiology: ordered.  Risk Prescription drug management. Decision regarding hospitalization.   This patient complains of cough shortness of breath; this involves an extensive number of treatment Options and is a complaint that carries with it a high risk of complications and morbidity. The differential includes CHF, COPD, pneumonia, COVID, flu, anemia, PE  I ordered, reviewed and interpreted labs, which included  CBC with elevated white count normal hemoglobin, chemistries consistent with her CKD, COVID-negative, BNP elevated, troponins flat I ordered medication IV antibiotics steroids and reviewed PMP when indicated. I ordered imaging studies which included chest x-ray and I independently    visualized and interpreted imaging which showed fluid overload possible basilar opacity Previous records obtained and reviewed patient admitted end of July for placement of AV fistula I consulted [facility] hospitalist Dr. Arbutus Leas and nephrology Dr. Thedore Mins and discussed lab and imaging findings and discussed disposition.  Cardiac monitoring reviewed, sinus rhythm Social determinants considered, physically inactive Critical Interventions: None  After the interventions stated above, I reevaluated the patient and found patient to be resting comfortably although still with increased work of breathing and tachypneic Admission and further testing considered, she would benefit from mission to the hospital for dialysis and further treatment of possible pneumonia.  She is in agreement with plan for admission.         Final Clinical Impression(s) / ED Diagnoses Final diagnoses:  End stage renal disease (HCC)  Hypervolemia, unspecified hypervolemia type  Community acquired pneumonia, unspecified laterality    Rx / DC Orders ED Discharge  Orders     None         Terrilee Files, MD 01/25/23 1721

## 2023-01-25 NOTE — ED Triage Notes (Signed)
Brother brought in by POV, Patient stated she had been taking medication bronchitis. Began to have more difficulty breathing last night.

## 2023-01-25 NOTE — ED Notes (Signed)
Pt states she "barely makes urine anymore, maybe once a day depending on how much fluids I drink, but its not a lot when I do"

## 2023-01-25 NOTE — Hospital Course (Signed)
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  The patient went to dialysis and stayed the whole session.  She continued to have shortness of breath and coughing.  She went to urgent care after dialysis.  The patient was given a prescription for prednisone 20 mg daily, doxycycline, albuterol HFA, and Tessalon Perles.  She took the medications without much improvement.  She continued to have shortness of breath and largely nonproductive cough.  As result, she presented for further evaluation and treatment.  The patient denies any fevers, chills, headache, neck pain, chest pain, nausea, vomiting, diarrhea, abdominal pain. The patient has similar admission from 09/07/2022 to 09/09/2022 when she was treated for COPD exacerbation, fluid overload and pneumonia. In the ED, the patient was afebrile and hemodynamically stable with oxygen saturation 93% on room air.  WBC 14.0, hemoglobin 12.0, platelets 281,000.  Sodium 143, potassium 3.4, bicarbonate 20, serum creatinine 5.78.  BNP 2267.  Troponin 16>> 16.  COVID-19 PCR was negative.  Chest x-ray showed pulmonary vascular congestion.  There is additional opacities at the lung bases.  There are small bilateral pleural effusions.  EKG shows sinus rhythm and nonspecific ST changes.  Nephrology was contacted and plans for dialysis on 01/25/2023.  The patient was started on doxycycline and ceftriaxone in the ED.

## 2023-01-25 NOTE — Progress Notes (Signed)
HEMODIALYSIS TREATMENT NOTE:   3 hour heparin-free treatment completed using RIJ TDC (ok per Dr. Thedore Mins d/t time constraints/high pt census). Catheter exit site is unremarkable.  Net UF 2.5 liters.  All blood was returned.  Meds given:  Vancomycin 1g, Cefepime 1g  Post-HD:  01/25/23 2025  Vitals  Temp 97.9 F (36.6 C)  Temp Source Oral  BP (!) 156/68  MAP (mmHg) 92  BP Location Right Arm  BP Method Automatic  Patient Position (if appropriate) Sitting  Pulse Rate 68  Pulse Rate Source Monitor  ECG Heart Rate 65  Resp 18  Oxygen Therapy  SpO2 99 %  O2 Device Nasal Cannula  O2 Flow Rate (L/min) 1.5 L/min  Hepatitis B Pre Treatment Patient Checks  Hepatitis B Surface Antigen Results Pending (labs drawn, awaiting results)  Date Hepatitis B Surface Antigen Drawn 01/25/23  Hep B Antibody Quant/Post still pending  Date Hep B Antibody Quant/Post Drawn 01/25/23  Patient's Immunity Status Pending (labs drawn, awaiting results)  Isolation Initiated Unknown Hepatitis status (Tablo 1 - chemically disinfected)  Post Treatment  Dialyzer Clearance Lightly streaked  Hemodialysis Intake (mL) 0 mL  Liters Processed 68.3  Fluid Removed (mL) 2500 mL  Tolerated HD Treatment Yes  Post-Hemodialysis Comments Tolerated HD  Fistula / Graft Left Upper arm Arteriovenous vein graft  Placement Date/Time: 11/29/22 1428   Orientation: Left  Access Location: Upper arm  Access Type: Arteriovenous vein graft  Fistula / Graft Assessment Thrill;Bruit  Status Patent  Hemodialysis Catheter Right Internal jugular Double lumen Permanent (Tunneled)  Placement Date/Time: 05/24/22 0953   Serial / Lot #: [id number]  Expiration Date: 02/03/27  Time Out: Correct patient;Correct site;Correct procedure  Maximum sterile barrier precautions: Hand hygiene;Cap;Mask;Sterile gown;Sterile gloves;Large sterile ...  Site Condition No complications  Blue Lumen Status Flushed;Heparin locked;Dead end cap in place  Red Lumen Status  Flushed;Heparin locked;Dead end cap in place  Purple Lumen Status N/A  Catheter fill solution Heparin 1000 units/ml  Catheter fill volume (Arterial) 1.6 cc  Catheter fill volume (Venous) 1.6  Dressing Type Transparent;Tube stabilization device  Dressing Status Antimicrobial disc in place;Clean, Dry, Intact  Interventions New dressing  Drainage Description None  Dressing Change Due 02/01/23  Post treatment catheter status Capped and Clamped    Arman Filter, RN [facility]

## 2023-01-25 NOTE — H&P (Signed)
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  The patient went to dialysis and stayed the whole session.  She continued to have shortness of breath and coughing.  She went to urgent care after dialysis.  The patient was given a prescription for prednisone 20 mg daily, doxycycline, albuterol HFA, and Tessalon Perles.  She took the medications without much improvement.  She continued to have shortness of breath and largely nonproductive cough.  As result, she presented for further evaluation and treatment.  The patient denies any fevers, chills, headache, neck pain, chest pain, nausea, vomiting, diarrhea, abdominal pain. The patient has similar admission from 09/07/2022 to 09/09/2022 when she was treated for COPD exacerbation, fluid overload and pneumonia. In the ED, the patient was afebrile and hemodynamically stable with oxygen saturation 93% on room air.  WBC 14.0, hemoglobin 12.0, platelets 281,000.  Sodium 143, potassium 3.4, bicarbonate 20, serum creatinine 5.78.  BNP 2267.  Troponin 16>> 16.  COVID-19 PCR was negative.  Chest x-ray showed pulmonary vascular congestion.  There is additional opacities at the lung bases.  There are small bilateral pleural effusions.  EKG shows sinus rhythm and nonspecific ST changes.  Nephrology was contacted and plans for dialysis on 01/25/2023.  The patient was started on doxycycline and ceftriaxone in the ED.  Review of Systems: As mentioned in the history of present illness. All other systems reviewed and are  negative. Past Medical History:  Diagnosis Date   Anemia    Chronic kidney disease (CKD), stage IV (severe) (HCC)    COPD (chronic obstructive pulmonary disease) (HCC)    ESRD (end stage renal disease) (HCC)    Hemodialysis T, TH, SAT, [address] , [place]   GERD (gastroesophageal reflux disease)    History of blood transfusion    History of blood transfusion    Hypertension    Pneumonia    3 times in 2024-   PUD (peptic ulcer disease) 05/2022   Rheumatoid arthritis (HCC)    Rheumatoid arthritis (HCC)    Past Surgical History:  Procedure Laterality Date   AV FISTULA PLACEMENT Left 11/29/2022   Procedure: INSERTION OF LEFT UPPER  ARTERIOVENOUS (AV) GORE-TEX GRAFT ARM;  Surgeon: Victorino Sparrow, MD;  Location: [facility] OR;  Service: Vascular;  Laterality: Left;   CENTRAL VENOUS CATHETER INSERTION Left 05/27/2022   Procedure: INSERTION CENTRAL LINE ADULT;  Surgeon: Lucretia Roers, MD;  Location: [facility] ORS;  Service: General;  Laterality: Left;   COLONOSCOPY WITH PROPOFOL N/A 05/22/2022   Surgeon: Marguerita Merles, Reuel Boom, MD; blood in the terminal ileum and colon.   ESOPHAGOGASTRODUODENOSCOPY (EGD) WITH PROPOFOL N/A 05/22/2022   Surgeon: Marguerita Merles, Reuel Boom, MD; peptic ulcer disease with 2 nonbleeding cratered gastric ulcers, multiple duodenal ulcers, 2 of which had adherent clots that were removed and revealed 2 vessels.  1 vessel was clipped, cauterized, and treated with epinephrine injection.  Second vessel was clipped and treated with cautery, clip fell off.   IR FLUORO GUIDE CV LINE RIGHT  05/24/2022   IR US GUIDE VASC ACCESS RIGHT  05/24/2022   LAPAROTOMY N/A 05/27/2022   Procedure: EXPLORATORY LAPAROTOMY, OMENTAL PATH, DRAIN PLACEMENT;  Surgeon: Lucretia Roers, MD;  Location: [facility];  Service: General;  Laterality: N/A;   VOLVULUS REDUCTION     colon resection   Social History:  reports that she has quit smoking. Her smoking use included cigarettes. She has been exposed to  tobacco smoke. She has never used smokeless tobacco. She reports that she does not currently use alcohol. She reports that she does not use drugs.  No Known Allergies  No family history on file.  Prior to Admission medications   Medication Sig Start Date End Date Taking? Authorizing Provider  amLODipine (NORVASC) 10 MG tablet Take 10 mg by mouth daily.    [provider]  atorvastatin (LIPITOR) 40 MG tablet Take 40 mg by mouth at bedtime. 09/22/22   [provider]  furosemide (LASIX) 80 MG tablet Take 80 mg by mouth daily.    [provider]  hydrALAZINE (APRESOLINE) 50 MG tablet Take 50 mg by mouth 2 (two) times daily. 07/14/22   [provider]  metoprolol succinate (TOPROL-XL) 25 MG 24 hr tablet Take 25 mg by mouth daily. 10/15/22   [provider]  multivitamin (RENA-VIT) TABS tablet Take 1 tablet by mouth daily. 10/15/22   [provider]  pantoprazole (PROTONIX) 40 MG tablet Take 1 tablet (40 mg total) by mouth 2 (two) times daily. 06/05/22 06/05/23  Vassie Loll, MD  sertraline (ZOLOFT) 50 MG tablet Take 50 mg by mouth daily. 05/12/22 11/29/22  [provider]  sevelamer carbonate (RENVELA) 800 MG tablet Take 800 mg by mouth 3 (three) times daily with meals. 11/23/22   [provider]    Physical Exam: Vitals:   01/25/23 1045 01/25/23 1100 01/25/23 1215 01/25/23 1315  BP: (!) 196/66 (!) 200/69 (!) 195/62 (!) 203/65  Pulse: 80 80 74 77  Resp: (!) 30 (!) 24 (!) 27 (!) 22  Temp:  98.6 F (37 C)    TempSrc:  Oral    SpO2: 97% 96% 95% 98%  Weight:      Height:       GENERAL:  A&O x 3, NAD, well developed, cooperative, follows commands HEENT: [place]/AT, No thrush, No icterus, No oral ulcers Neck:  No neck mass, No meningismus, soft, supple CV: RRR, no S3, no S4, no rub, no JVD Lungs: Bilateral wheezing.  Bilateral rales.  Diminished breath sounds bilateral. Abd: soft/NT +BS, nondistended Ext: No edema, no lymphangitis,  no cyanosis, no rashes Neuro:  CN II-XII intact, strength 4/5 in RUE, RLE, strength 4/5 LUE, LLE; sensation intact bilateral; no dysmetria; babinski equivocal  Data Reviewed: Data reviewed above in the history  Assessment and Plan: Fluid overload with pleural effusions -Nephrology plans for dialysis 01/25/2023 -09/08/2022 echo EF 60 to 65%, no WMA, normal RVEF, trivial MR, mild TR -Patient endorses compliance with dialysis and dietary restrictions   Lobar pneumonia -MRSA screen -vancomycin and cefepime pending culture data -continue doxy   COPD exacerbation -Start IV Solu-Medrol -Start bronchodilators -Start Pulmicort -Start Brovana   ESRD -Nephrology consulted for maintenance dialysis -Patient dialyzes on Tuesday, Thursday, Saturday   FSGS -The patient was taking prednisone 3 tablets daily at home -She is unclear to exact dosing -Currently on IV Solu-Medrol for COPD exacerbation   Hypokalemia -Replete   Essential hypertension -Restart metoprolol amlodipine and hydralazine     Advance Care Planning: FULL  Consults: renal  Family Communication: none  Severity of Illness:  The appropriate patient status for this patient is OBSERVATION. Observation status is judged to be reasonable and necessary in order to provide the required intensity of service to ensure the patient's safety. The patient's presenting symptoms, physical exam findings, and initial radiographic and laboratory data in the context of their medical condition is felt to place them at decreased risk for further clinical deterioration. Furthermore, it is anticipated that the patient will be medically stable for discharge from the hospital within 2 midnights of admission.   Author: Catarina Hartshorn, MD 01/25/2023 1:25 PM  For on call review [email or web address].

## 2023-01-25 NOTE — ED Notes (Signed)
Pt states she is supposed to have dialysis today

## 2023-01-25 NOTE — Progress Notes (Signed)
Informed by ER MD of patient. Here with SOB. Probable pneumonia. 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  Outpatient orders: [facility]. 3hrs . EDW 42.5 kg.  Flow rates: 350/500.  3K/2.5 calcium.  Heparin: None.  Access: New LUE AVG (16-gauge), does have a TDC that is ready to remove (has already been referred to CKD as an outpatient).  Meds: Calcitriol 0.75 mcg every treatment, Mircera 75 mcg every 4 weeks (last dose 9/10), Venofer 50 mg once weekly (due today).  Plan: -plan to dialyze today, keep on TTS if census permits -full consult to follow if patient is still here tomorrow -please call with any questions/concerns  Anthony Sar, MD [facility]

## 2023-01-25 NOTE — Progress Notes (Signed)
Pharmacy Antibiotic Note  Sheri Cooper is a 72 y.o. female admitted on 01/25/2023 with pneumonia.  Pharmacy has been consulted for vancomycin and cefepime dosing.  Patient is ESRD, due for HD today. Renal is planning HD today and then continue TTS if possible.   Patient currently afebrile, wbc 14.   Plan: Vancomycin 1g IV x1 now then  500mg  qHD Cefepime 2g now then 1g q24 hours until HD schedule is known  Height: 4' 11.5" (151.1 cm) Weight: 44.5 kg (98 lb) IBW/kg (Calculated) : 44.35  Temp (24hrs), Avg:98.4 F (36.9 C), Min:98 F (36.7 C), Max:98.6 F (37 C)  Recent Labs  Lab 01/25/23 1000  WBC 14.0*  CREATININE 5.78*    Estimated Creatinine Clearance: 6.1 mL/min (A) (by C-G formula based on SCr of 5.78 mg/dL (H)).    No Known Allergies  Thank you for allowing pharmacy to be a part of this patient's care.  Sheppard Coil PharmD., BCPS Clinical Pharmacist 01/25/2023 2:15 PM

## 2023-01-25 NOTE — ED Notes (Signed)
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

## 2023-01-25 NOTE — Plan of Care (Signed)
  Problem: Education: Goal: Knowledge of General Education information will improve Description Including pain rating scale, medication(s)/side effects and non-pharmacologic comfort measures Outcome: Progressing   Problem: Health Behavior/Discharge Planning: Goal: Ability to manage health-related needs will improve Outcome: Progressing   

## 2023-01-25 NOTE — ED Notes (Signed)
Pt states she did take her BP medications today, BP cycling high the last couple of cycles, last reading was 200/69--MD made aware

## 2023-01-26 DIAGNOSIS — Z87891 Personal history of nicotine dependence: Secondary | ICD-10-CM | POA: Diagnosis not present

## 2023-01-26 DIAGNOSIS — J9601 Acute respiratory failure with hypoxia: Secondary | ICD-10-CM | POA: Diagnosis not present

## 2023-01-26 DIAGNOSIS — E8770 Fluid overload, unspecified: Principal | ICD-10-CM

## 2023-01-26 DIAGNOSIS — J181 Lobar pneumonia, unspecified organism: Secondary | ICD-10-CM | POA: Diagnosis present

## 2023-01-26 DIAGNOSIS — Z79899 Other long term (current) drug therapy: Secondary | ICD-10-CM | POA: Diagnosis not present

## 2023-01-26 DIAGNOSIS — M898X9 Other specified disorders of bone, unspecified site: Secondary | ICD-10-CM | POA: Diagnosis present

## 2023-01-26 DIAGNOSIS — J440 Chronic obstructive pulmonary disease with acute lower respiratory infection: Secondary | ICD-10-CM | POA: Diagnosis present

## 2023-01-26 DIAGNOSIS — E876 Hypokalemia: Secondary | ICD-10-CM | POA: Diagnosis present

## 2023-01-26 DIAGNOSIS — J810 Acute pulmonary edema: Secondary | ICD-10-CM | POA: Diagnosis present

## 2023-01-26 DIAGNOSIS — Z8711 Personal history of peptic ulcer disease: Secondary | ICD-10-CM | POA: Diagnosis not present

## 2023-01-26 DIAGNOSIS — Z7952 Long term (current) use of systemic steroids: Secondary | ICD-10-CM | POA: Diagnosis not present

## 2023-01-26 DIAGNOSIS — N250 Renal osteodystrophy: Secondary | ICD-10-CM | POA: Diagnosis not present

## 2023-01-26 DIAGNOSIS — J441 Chronic obstructive pulmonary disease with (acute) exacerbation: Secondary | ICD-10-CM | POA: Diagnosis present

## 2023-01-26 DIAGNOSIS — E782 Mixed hyperlipidemia: Secondary | ICD-10-CM | POA: Diagnosis present

## 2023-01-26 DIAGNOSIS — Z1152 Encounter for screening for COVID-19: Secondary | ICD-10-CM | POA: Diagnosis not present

## 2023-01-26 DIAGNOSIS — I120 Hypertensive chronic kidney disease with stage 5 chronic kidney disease or end stage renal disease: Secondary | ICD-10-CM | POA: Diagnosis not present

## 2023-01-26 DIAGNOSIS — M069 Rheumatoid arthritis, unspecified: Secondary | ICD-10-CM | POA: Diagnosis present

## 2023-01-26 DIAGNOSIS — N186 End stage renal disease: Secondary | ICD-10-CM | POA: Diagnosis not present

## 2023-01-26 DIAGNOSIS — D631 Anemia in chronic kidney disease: Secondary | ICD-10-CM | POA: Diagnosis not present

## 2023-01-26 DIAGNOSIS — Z992 Dependence on renal dialysis: Secondary | ICD-10-CM | POA: Diagnosis not present

## 2023-01-26 DIAGNOSIS — Z853 Personal history of malignant neoplasm of breast: Secondary | ICD-10-CM | POA: Diagnosis not present

## 2023-01-26 DIAGNOSIS — J90 Pleural effusion, not elsewhere classified: Secondary | ICD-10-CM | POA: Diagnosis present

## 2023-01-26 DIAGNOSIS — R0602 Shortness of breath: Secondary | ICD-10-CM | POA: Diagnosis not present

## 2023-01-26 DIAGNOSIS — Z23 Encounter for immunization: Secondary | ICD-10-CM | POA: Diagnosis not present

## 2023-01-26 LAB — CBC
HCT: 37.4 % (ref 36.0–46.0)
Hemoglobin: 12.6 g/dL (ref 12.0–15.0)
MCH: 27.2 pg (ref 26.0–34.0)
MCHC: 33.7 g/dL (ref 30.0–36.0)
MCV: 80.8 fL (ref 80.0–100.0)
Platelets: 393 10*3/uL (ref 150–400)
RBC: 4.63 MIL/uL (ref 3.87–5.11)
RDW: 18.8 % — ABNORMAL HIGH (ref 11.5–15.5)
WBC: 9.4 10*3/uL (ref 4.0–10.5)
nRBC: 0 % (ref 0.0–0.2)

## 2023-01-26 LAB — COMPREHENSIVE METABOLIC PANEL
ALT: 30 U/L (ref 0–44)
AST: 30 U/L (ref 15–41)
Albumin: 3.2 g/dL — ABNORMAL LOW (ref 3.5–5.0)
Alkaline Phosphatase: 163 U/L — ABNORMAL HIGH (ref 38–126)
Anion gap: 13 (ref 5–15)
BUN: 41 mg/dL — ABNORMAL HIGH (ref 8–23)
CO2: 25 mmol/L (ref 22–32)
Calcium: 8.8 mg/dL — ABNORMAL LOW (ref 8.9–10.3)
Chloride: 97 mmol/L — ABNORMAL LOW (ref 98–111)
Creatinine, Ser: 3.46 mg/dL — ABNORMAL HIGH (ref 0.44–1.00)
GFR, Estimated: 13 mL/min — ABNORMAL LOW (ref >60)
Glucose, Bld: 110 mg/dL — ABNORMAL HIGH (ref 70–99)
Potassium: 4 mmol/L (ref 3.5–5.1)
Sodium: 135 mmol/L (ref 135–145)
Total Bilirubin: 0.7 mg/dL (ref 0.3–1.2)
Total Protein: 7.2 g/dL (ref 6.5–8.1)

## 2023-01-26 LAB — HEPATITIS B SURFACE ANTIBODY, QUANTITATIVE: Hep B S AB Quant (Post): <3.5 m[IU]/mL — ABNORMAL LOW

## 2023-01-26 LAB — HEPATITIS B SURFACE ANTIGEN

## 2023-01-26 MED ORDER — SODIUM CHLORIDE 0.9 % IV SOLN
2.0000 g | INTRAVENOUS | Status: DC
  Administered 2023-01-27: 2 g via INTRAVENOUS
  Filled 2023-01-26: qty 12.5

## 2023-01-26 MED ORDER — IPRATROPIUM-ALBUTEROL 0.5-2.5 (3) MG/3ML IN SOLN
3.0000 mL | Freq: Two times a day (BID) | RESPIRATORY_TRACT | Status: DC
  Administered 2023-01-26 – 2023-01-27 (×2): 3 mL via RESPIRATORY_TRACT
  Filled 2023-01-26 (×2): qty 3

## 2023-01-26 MED ORDER — HYDRALAZINE HCL 20 MG/ML IJ SOLN: 10.0000 mg | INTRAMUSCULAR | Status: DC | PRN

## 2023-01-26 MED ORDER — PREDNISONE 20 MG PO TABS
40.0000 mg | ORAL_TABLET | Freq: Every day | ORAL | Status: DC
  Administered 2023-01-27: 40 mg via ORAL
  Filled 2023-01-26: qty 2

## 2023-01-26 NOTE — Progress Notes (Addendum)
Ambulated in hall with cane on 1 liter and sat was 95%.  Stated that [facility] in [place] spoke with someone here at [facility] concerning having her dialysis catheter removed during this hospitalization.  Dr. Beaulah Corin note states that she has been referred to CKD for this. Secure chatted Dr. Signe Colt and Dr. Jarvis Newcomer concerning Davita's request. Bp's have been in 140's 150's and latest 172/56. Dr. Jarvis Newcomer ordered prn apresoline prn for > 180.

## 2023-01-26 NOTE — Progress Notes (Addendum)
Patient had just returned from dialysis and was eating her dinner.  Patient did not want her breathing treatments at that time.  I told her she had a treatment at 2am and that I would be back at that time to administer treatment.  Patient agreed.  Checked patient's O2 sat at that time, patient sat around 88%, so I placed her back on her 1.5L [place].

## 2023-01-26 NOTE — Progress Notes (Signed)
   Nephrology Nursing Note:  Last night pre-dialysis spO2 99% on room air.  O2 was started at 2L via [place] at pt's request, for her comfort only.  Arman Filter, RN

## 2023-01-26 NOTE — TOC CM/SW Note (Signed)
Transition of Care [facility]) - Inpatient Brief Assessment   Patient Details  Name: Sheri Cooper MRN: [id number] Date of Birth: [birth year]  Transition of Care [facility]) CM/SW Contact:    Villa Herb, LCSWA Phone Number: 01/26/2023, 11:03 AM   Clinical Narrative: Transition of Care Department [facility]) has reviewed patient and no TOC needs have been identified at this time. We will continue to monitor patient advancement through interdisciplinary progression rounds. If new patient transition needs arise, please place a TOC consult.  CSW spoke with pt who states that she does not wear O2 at home. CSW explained to pt that if she needs O2 at D/C [facility] will follow and assist with setting this [place]. TOC to follow.   Transition of Care Asessment: Insurance and Status: Insurance coverage has been reviewed Patient has primary care physician: Yes Home environment has been reviewed: from home Prior level of function:: independent Prior/Current Home Services: No current home services Social Determinants of Health Reivew: SDOH reviewed no interventions necessary Readmission risk has been reviewed: Yes Transition of care needs: no transition of care needs at this time

## 2023-01-26 NOTE — Progress Notes (Signed)
Mobility Specialist Progress Note:    01/26/23 1355  Mobility  Activity Ambulated with assistance in hallway  Level of Assistance Contact guard assist, steadying assist  Assistive Device Cane  Distance Ambulated (ft) 150 ft  Range of Motion/Exercises Active;All extremities  Activity Response Tolerated well  Mobility Referral Yes  $Mobility charge 1 Mobility  Mobility Specialist Start Time (ACUTE ONLY) 1350  Mobility Specialist Stop Time (ACUTE ONLY) 1400  Mobility Specialist Time Calculation (min) (ACUTE ONLY) 10 min   Pt received in bed, agreeable to mobility. Required CGA to stand and ambulate with cane. Tolerated well, SpO2 95% on 1L throughout session. Returned pt supine, call bell in reach. All needs met.   Lawerance Bach Mobility Specialist Please contact via Special educational needs teacher or  Rehab office at [phone]

## 2023-01-26 NOTE — Plan of Care (Signed)
Problem: Education: Goal: Knowledge of General Education information will improve Description Including pain rating scale, medication(s)/side effects and non-pharmacologic comfort measures Outcome: Progressing   Problem: Health Behavior/Discharge Planning: Goal: Ability to manage health-related needs will improve Outcome: Progressing

## 2023-01-26 NOTE — Progress Notes (Signed)
[facility] PROGRESS NOTE  Sheri Cooper (DOB: 11-27-1950) ACZ:[id number] PCP: Richardean Chimera, MD  Brief Narrative: Sheri Cooper is a 71 y.o. 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 She was wheezing and tachypneic improved with supplemental oxygen. WBC 14.0, BNP 2267. Troponin 16 > 16. COVID-19 PCR was negative. Chest x-ray showed pulmonary vascular congestion, lung base opacities, bilateral pleural effusions. She was given steroids, nebulized therapies, and initiated on urgent hemodialysis that evening.  Subjective: Continue to be quite short of breath with exertion, not at her baseline, though she feels much better than at admission.   Objective: BP (!) 150/55 (BP Location: Right Arm)   Pulse 78   Temp (!) 97.4 F (36.3 C) (Oral)   Resp 14   Ht 4' 11.5" (1.511 m)   Wt 42 kg   SpO2 92%   BMI 18.39 kg/m   Gen: No distress Pulm: Diminished at bases with crackles bibasilar as well. No wheezes  CV: RRR, no MRG or pitting edema GI: Soft, NT, ND, +BS  Neuro: Alert and oriented. No new focal deficits. Ext: Warm, no deformities. LUE AVG +thrill, R IJ TDC site c/d/i Skin: No new rashes, lesions or ulcers on visualized skin   Assessment & Plan: Acute hypoxic respiratory failure: Due to AECOPD with possible pneumonia, and volume overload (pulmonary edema, pleural effusions). - Wean oxygen as tolerated as long as SpO2 >89% and respiratory effort is normal.  - Treat as below  AECOPD with concern for bibasilar CAP in immunosuppressed patient: PCT 1.41.  - Continue empiric abx coverage x5 days. Hopefully can narrow soon, MRSA PCR pending. Monitor WBC, fever curve, cultures as appropriate. Since on broad coverage, remove doxycycline redundancy.  - Continue steroids, much improved exam, deescalate to prednisone - Suggest formal PFTs once returned to baseline function, consider  pulmonary evaluation given recurrent presentations.   Acute pulmonary edema, pleural effusions:  - Repeat HD again 9/26 - Monitor I/O, weights, and cardiac telemetry (NSR for the most part over past 24 hours. Lost p waves transiently around 7pm last night, though rate remained regular and unchanged.   HTN:  - Continue home meds including lasix.   RA:  - On chronic steroids per pt, not on MAR though.  Hypokalemia:  - Supplemented.  Tyrone Nine, MD [facility] [email or web address] 01/26/2023, 3:44 PM

## 2023-01-26 NOTE — Consult Note (Addendum)
Reason for Consult: To manage dialysis and dialysis related needs Referring Physician: Dr Beatrix Shipper is an 72 y.o. female.  HPI: Pt is a 53F with a PMH sig for ESRD on HD TTS, COPD, PUD, and recurrent PNA who is now seen in consultation at the request of Dr Jarvis Newcomer for evaluation and recommendations surrounding provision of HD and management of ESRD.  Pt presented yesterday to [facility] with progressive h/o SOB and coughing which started 01/22/23.  Went to UC, was given pred/ doxy/ tessalon pearles, but without improvement.  Came to [facility].    She has had similar presentations in 2024 for fluid overload and pneumonia-- she says that this is the 4th time this year.    Was dialyzed yesterday and placed on IV antibiotics.  She says she feels much better today.    Outpatient orders: [facility]. 3hrs . EDW 42.5 kg.  Flow rates: 350/500.  3K/2.5 calcium.  Heparin: None.  Access: New LUE AVG (16-gauge), does have a TDC that is ready to remove (has already been referred to CKD as an outpatient).  Meds: Calcitriol 0.75 mcg every treatment, Mircera 75 mcg every 4 weeks (last dose 9/10), Venofer 50 mg once weekly (due today).   Past Medical History:  Diagnosis Date   Anemia    Chronic kidney disease (CKD), stage IV (severe) (HCC)    COPD (chronic obstructive pulmonary disease) (HCC)    ESRD (end stage renal disease) (HCC)    Hemodialysis T, TH, SAT, [address] [place]   GERD (gastroesophageal reflux disease)    History of blood transfusion    History of blood transfusion    Hypertension    Pneumonia    3 times in 2024-   PUD (peptic ulcer disease) 05/2022   Rheumatoid arthritis (HCC)    Rheumatoid arthritis (HCC)     Past Surgical History:  Procedure Laterality Date   AV FISTULA PLACEMENT Left 11/29/2022   Procedure: INSERTION OF LEFT UPPER  ARTERIOVENOUS (AV) GORE-TEX GRAFT ARM;  Surgeon: Victorino Sparrow, MD;  Location: [facility];  Service: Vascular;  Laterality: Left;   CENTRAL  VENOUS CATHETER INSERTION Left 05/27/2022   Procedure: INSERTION CENTRAL LINE ADULT;  Surgeon: Lucretia Roers, MD;  Location: [facility];  Service: General;  Laterality: Left;   COLONOSCOPY WITH PROPOFOL N/A 05/22/2022   Surgeon: Marguerita Merles, Reuel Boom, MD; blood in the terminal ileum and colon.   ESOPHAGOGASTRODUODENOSCOPY (EGD) WITH PROPOFOL N/A 05/22/2022   Surgeon: Marguerita Merles, Reuel Boom, MD; peptic ulcer disease with 2 nonbleeding cratered gastric ulcers, multiple duodenal ulcers, 2 of which had adherent clots that were removed and revealed 2 vessels.  1 vessel was clipped, cauterized, and treated with epinephrine injection.  Second vessel was clipped and treated with cautery, clip fell off.   IR FLUORO GUIDE CV LINE RIGHT  05/24/2022   IR US GUIDE VASC ACCESS RIGHT  05/24/2022   LAPAROTOMY N/A 05/27/2022   Procedure: EXPLORATORY LAPAROTOMY, OMENTAL PATH, DRAIN PLACEMENT;  Surgeon: Lucretia Roers, MD;  Location: [facility];  Service: General;  Laterality: N/A;   VOLVULUS REDUCTION     colon resection    History reviewed. No pertinent family history.  Social History:  reports that she has quit smoking. Her smoking use included cigarettes. She has been exposed to tobacco smoke. She has never used smokeless tobacco. She reports that she does not currently use alcohol. She reports that she does not use drugs.  Allergies: No Known Allergies  Medications: Scheduled:  amLODipine  10 mg Oral Daily   arformoterol  15 mcg Nebulization BID   atorvastatin  40 mg Oral QHS   budesonide (PULMICORT) nebulizer solution  0.5 mg Nebulization BID   calcitRIOL  0.75 mcg Oral Q T,Th,Sa-HD   Chlorhexidine Gluconate Cloth  6 each Topical Q0600   doxycycline  100 mg Oral Q12H   furosemide  80 mg Oral Daily   heparin  5,000 Units Subcutaneous Q8H   hydrALAZINE  50 mg Oral BID   ipratropium-albuterol  3 mL Nebulization BID   methylPREDNISolone (SOLU-MEDROL) injection  60 mg Intravenous Q12H    metoprolol succinate  25 mg Oral Daily   multivitamin  1 tablet Oral Daily   pantoprazole  40 mg Oral BID   sertraline  50 mg Oral Daily   sevelamer carbonate  800 mg Oral TID WC     Results for orders placed or performed during the hospital encounter of 01/25/23 (from the past 48 hour(s))  Basic metabolic panel     Status: Abnormal   Collection Time: 01/25/23 10:00 AM  Result Value Ref Range   Sodium 143 135 - 145 mmol/L   Potassium 3.4 (L) 3.5 - 5.1 mmol/L   Chloride 109 98 - 111 mmol/L   CO2 20 (L) 22 - 32 mmol/L   Glucose, Bld 130 (H) 70 - 99 mg/dL    Comment: Glucose reference range applies only to samples taken after fasting for at least 8 hours.   BUN 74 (H) 8 - 23 mg/dL   Creatinine, Ser 4.09 (H) 0.44 - 1.00 mg/dL   Calcium 8.9 8.9 - 81.1 mg/dL   GFR, Estimated 7 (L) >60 mL/min    Comment: (NOTE) Calculated using the CKD-EPI Creatinine Equation (2021)    Anion gap 14 5 - 15    Comment: Performed at [facility], [address], [place], [place] [address]  Brain natriuretic peptide     Status: Abnormal   Collection Time: 01/25/23 10:00 AM  Result Value Ref Range   B Natriuretic Peptide 2,267.0 (H) 0.0 - 100.0 pg/mL    Comment: Performed at [facility], [address], [place], [place] [address]  Troponin I (High Sensitivity)     Status: None   Collection Time: 01/25/23 10:00 AM  Result Value Ref Range   Troponin I (High Sensitivity) 16 <18 ng/L    Comment: (NOTE) Elevated high sensitivity troponin I (hsTnI) values and significant  changes across serial measurements may suggest ACS but many other  chronic and acute conditions are known to elevate hsTnI results.  Refer to the "Links" section for chest pain algorithms and additional  guidance. Performed at [facility], [address] [place], [place] [address]   CBC with Differential     Status: Abnormal   Collection Time: 01/25/23 10:00 AM  Result Value Ref Range   WBC 14.0 (H) 4.0 - 10.5 K/uL   RBC 4.39 3.87 -  5.11 MIL/uL   Hemoglobin 12.0 12.0 - 15.0 g/dL   HCT 57.8 46.9 - 62.9 %   MCV 83.6 80.0 - 100.0 fL   MCH 27.3 26.0 - 34.0 pg   MCHC 32.7 30.0 - 36.0 g/dL   RDW 52.8 (H) 41.3 - 24.4 %   Platelets 381 150 - 400 K/uL   nRBC 0.0 0.0 - 0.2 %   Neutrophils Relative % 66 %   Neutro Abs 9.3 (H) 1.7 - 7.7 K/uL   Lymphocytes Relative 23 %   Lymphs Abs  3.2 0.7 - 4.0 K/uL   Monocytes Relative 7 %   Monocytes Absolute 1.0 0.1 - 1.0 K/uL   Eosinophils Relative 1 %   Eosinophils Absolute 0.1 0.0 - 0.5 K/uL   Basophils Relative 1 %   Basophils Absolute 0.1 0.0 - 0.1 K/uL   Immature Granulocytes 2 %   Abs Immature Granulocytes 0.28 (H) 0.00 - 0.07 K/uL    Comment: Performed at [facility], [address], [place], [place] [address]  SARS Coronavirus 2 by RT PCR (hospital order, performed in [facility] hospital lab) *cepheid single result test* Anterior Nasal Swab     Status: None   Collection Time: 01/25/23 10:03 AM   Specimen: Anterior Nasal Swab  Result Value Ref Range   SARS Coronavirus 2 by RT PCR NEGATIVE NEGATIVE    Comment: (NOTE) SARS-CoV-2 target nucleic acids are NOT DETECTED.  The SARS-CoV-2 RNA is generally detectable in upper and lower respiratory specimens during the acute phase of infection. The lowest concentration of SARS-CoV-2 viral copies this assay can detect is 250 copies / mL. A negative result does not preclude SARS-CoV-2 infection and should not be used as the sole basis for treatment or other patient management decisions.  A negative result may occur with improper specimen collection / handling, submission of specimen other than nasopharyngeal swab, presence of viral mutation(s) within the areas targeted by this assay, and inadequate number of viral copies (<250 copies / mL). A negative result must be combined with clinical observations, patient history, and epidemiological information.  Fact Sheet for Patients:   RoadLapTop.co.za  Fact  Sheet for Healthcare Providers: http://kim-miller.com/  This test is not yet approved or  cleared by the [place] FDA and has been authorized for detection and/or diagnosis of SARS-CoV-2 by FDA under an Emergency Use Authorization (EUA).  This EUA will remain in effect (meaning this test can be used) for the duration of the COVID-19 declaration under Section 564(b)(1) of the Act, 21 U.S.C. section 360bbb-3(b)(1), unless the authorization is terminated or revoked sooner.  Performed at [facility], [address] [place], [place] [address]   Troponin I (High Sensitivity)     Status: None   Collection Time: 01/25/23 12:04 PM  Result Value Ref Range   Troponin I (High Sensitivity) 16 <18 ng/L    Comment: (NOTE) Elevated high sensitivity troponin I (hsTnI) values and significant  changes across serial measurements may suggest ACS but many other  chronic and acute conditions are known to elevate hsTnI results.  Refer to the "Links" section for chest pain algorithms and additional  guidance. Performed at [facility], [address], [place], [place] [address]   Hepatitis B surface antibody,quantitative     Status: Abnormal   Collection Time: 01/25/23  5:28 PM  Result Value Ref Range   Hep B S AB Quant (Post) <3.5 (L) Immunity>10 mIU/mL    Comment: (NOTE)  Status of Immunity                     Anti-HBs Level  ------------------                     -------------- Inconsistent with Immunity                  0.0 - 10.0 Consistent with Immunity                         >10.0 Performed At:  [facility] [address] [place], [place] [id number] Jolene Schimke MD WU:[id number]   Hepatitis B surface antigen     Status: Abnormal   Collection Time: 01/25/23  5:28 PM  Result Value Ref Range   Hepatitis B Surface Ag (A) NON REACTIVE    Repeat testing was performed on the submitted specimen with repeated uninterpretable results. It is recommended that a new  specimen be obtained and retested.    Comment: Performed at [facility], [address], [place], [place] [address]  Procalcitonin     Status: None   Collection Time: 01/25/23  5:28 PM  Result Value Ref Range   Procalcitonin 1.41 ng/mL    Comment:        Interpretation: PCT > 0.5 ng/mL and <= 2 ng/mL: Systemic infection (sepsis) is possible, but other conditions are known to elevate PCT as well. (NOTE)       Sepsis PCT Algorithm           Lower Respiratory Tract                                      Infection PCT Algorithm    ----------------------------     ----------------------------         PCT < 0.25 ng/mL                PCT < 0.10 ng/mL          Strongly encourage             Strongly discourage   discontinuation of antibiotics    initiation of antibiotics    ----------------------------     -----------------------------       PCT 0.25 - 0.50 ng/mL            PCT 0.10 - 0.25 ng/mL               OR       >80% decrease in PCT            Discourage initiation of                                            antibiotics      Encourage discontinuation           of antibiotics    ----------------------------     -----------------------------         PCT >= 0.50 ng/mL              PCT 0.26 - 0.50 ng/mL                AND       <80% decrease in PCT             Encourage initiation of                                             antibiotics       Encourage continuation           of antibiotics    ----------------------------     -----------------------------        PCT >= 0.50 ng/mL  PCT > 0.50 ng/mL               AND         increase in PCT                  Strongly encourage                                      initiation of antibiotics    Strongly encourage escalation           of antibiotics                                     -----------------------------                                           PCT <= 0.25 ng/mL                                                  OR                                        > 80% decrease in PCT                                      Discontinue / Do not initiate                                             antibiotics  Performed at [facility], [address], [place], [place] [address]   Comprehensive metabolic panel     Status: Abnormal   Collection Time: 01/26/23  4:32 AM  Result Value Ref Range   Sodium 135 135 - 145 mmol/L    Comment: DELTA CHECK NOTED   Potassium 4.0 3.5 - 5.1 mmol/L   Chloride 97 (L) 98 - 111 mmol/L   CO2 25 22 - 32 mmol/L   Glucose, Bld 110 (H) 70 - 99 mg/dL    Comment: Glucose reference range applies only to samples taken after fasting for at least 8 hours.   BUN 41 (H) 8 - 23 mg/dL   Creatinine, Ser 0.98 (H) 0.44 - 1.00 mg/dL   Calcium 8.8 (L) 8.9 - 10.3 mg/dL   Total Protein 7.2 6.5 - 8.1 g/dL   Albumin 3.2 (L) 3.5 - 5.0 g/dL   AST 30 15 - 41 U/L   ALT 30 0 - 44 U/L   Alkaline Phosphatase 163 (H) 38 - 126 U/L   Total Bilirubin 0.7 0.3 - 1.2 mg/dL   GFR, Estimated 13 (L) >60 mL/min    Comment: (NOTE) Calculated using the CKD-EPI Creatinine Equation (2021)    Anion gap 13 5 - 15    Comment: Performed at [facility], [address] [place], [place]  [address]  CBC     Status: Abnormal   Collection Time: 01/26/23  4:32 AM  Result Value Ref Range   WBC 9.4 4.0 - 10.5 K/uL   RBC 4.63 3.87 - 5.11 MIL/uL   Hemoglobin 12.6 12.0 - 15.0 g/dL   HCT 21.3 08.6 - 57.8 %   MCV 80.8 80.0 - 100.0 fL   MCH 27.2 26.0 - 34.0 pg   MCHC 33.7 30.0 - 36.0 g/dL   RDW 46.9 (H) 62.9 - 52.8 %   Platelets 393 150 - 400 K/uL   nRBC 0.0 0.0 - 0.2 %    Comment: Performed at [facility], [address], [place], [place] [address]    DG Chest Port 1 View  Result Date: 01/25/2023 CLINICAL DATA:  Provided history: Shortness of breath. EXAM: PORTABLE CHEST 1 VIEW COMPARISON:  09/07/2022. Prior chest radiographs 09/07/2022 and earlier. FINDINGS: Right-sided dual lumen central venous catheter with tip  at the level of the right atrium. Mild cardiomegaly. Aortic atherosclerosis. Central pulmonary vascular congestion. Mild prominence of the intra lung markings suggesting interstitial edema. Small mild pleural effusions. Additional opacities at the bilateral lung bases which may reflect atelectasis and/or airspace consolidation. No evidence of pneumothorax. No acute osseous abnormality identified. IMPRESSION: 1. Cardiomegaly with pulmonary vascular congestion/interstitial edema. 2. Small bilateral pleural effusions. 3. Additional opacities at the bilateral lung bases, which may reflect atelectasis and/or airspace consolidation. 4. Aortic Atherosclerosis (ICD10-I70.0). Electronically Signed   By: Jackey Loge D.O.   On: 01/25/2023 12:32    ROS: all other systems reviewed and are negative except as per HPI  Blood pressure (!) 144/59, pulse 69, temperature 98.2 F (36.8 C), temperature source Oral, resp. rate 19, height 4' 11.5" (1.511 m), weight 42 kg, SpO2 92%. GEN  nad, sitting in bed, on O2 HEENT EOMI PERRL NECK no JVD today PULM still some crackles CV RRR ABD soft EXT no LE edema NEURO AAO x 3 nonfocal ACCESS LUE AVG and R internal jugular TDC  Assessment/Plan: 1 Acute hypoxic RF: improved with IV antibiotics (vanc/ ceftriaxone/ doxy) and dialysis.  Also got a dose of cefepime.  Recurrent issue.  Fluid may be primary driver- but may be helpful to see pulmonology as OP- had CT chest in May without overt findings- but since recurrent issue does she need bronch/ further testing 2 ESRD: TTS normally, HD on schedule yesterday, next HD tomorrow 3 Hypertension:  reasonably well controlled-  on home meds of amlodpine, hydralazine, metop.  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 Metabolic Bone Disease: on calcitriol and sevelamer 6.  Dispo: obs, possibly home soon?  Still on O2  Kristiann Noyce 01/26/2023, 10:20 AM

## 2023-01-27 DIAGNOSIS — E8770 Fluid overload, unspecified: Secondary | ICD-10-CM | POA: Diagnosis not present

## 2023-01-27 LAB — RENAL FUNCTION PANEL
Albumin: 3.2 g/dL — ABNORMAL LOW (ref 3.5–5.0)
Anion gap: 16 — ABNORMAL HIGH (ref 5–15)
BUN: 84 mg/dL — ABNORMAL HIGH (ref 8–23)
CO2: 22 mmol/L (ref 22–32)
Calcium: 8.5 mg/dL — ABNORMAL LOW (ref 8.9–10.3)
Chloride: 97 mmol/L — ABNORMAL LOW (ref 98–111)
Creatinine, Ser: 5.23 mg/dL — ABNORMAL HIGH (ref 0.44–1.00)
GFR, Estimated: 8 mL/min — ABNORMAL LOW (ref >60)
Glucose, Bld: 99 mg/dL (ref 70–99)
Phosphorus: 6.4 mg/dL — ABNORMAL HIGH (ref 2.5–4.6)
Potassium: 3.9 mmol/L (ref 3.5–5.1)
Sodium: 135 mmol/L (ref 135–145)

## 2023-01-27 LAB — HEPATITIS B SURFACE ANTIGEN: Hepatitis B Surface Ag: NONREACTIVE

## 2023-01-27 MED ORDER — SODIUM CHLORIDE 0.9 % IV SOLN: 2.0000 g | INTRAVENOUS | Status: AC

## 2023-01-27 MED ORDER — VANCOMYCIN HCL 500 MG/100ML IV SOLN: 500.0000 mg | INTRAVENOUS | Status: AC

## 2023-01-27 MED ORDER — PREDNISONE 20 MG PO TABS: 40.0000 mg | ORAL_TABLET | Freq: Every day | ORAL | 0 refills | Status: AC

## 2023-01-27 NOTE — Progress Notes (Signed)
[facility] Progress Note   Assessment/ Plan:   1 Acute hypoxic RF: improved with IV antibiotics (vanc/ ceftriaxone/ doxy) and dialysis.  Also got a dose of cefepime.  Recurrent issue.  Fluid may be primary driver- but may be helpful to see pulmonology as OP- had CT chest in May without overt findings- but since recurrent issue does she need bronch/ further testing 2 ESRD: TTS normally, HD on schedule, next Hd today.  Wants TDC removed- will see who placed, maybe can remove before d/c 3 Hypertension:  reasonably well controlled-  on home meds of amlodpine, hydralazine, metop.  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 Metabolic Bone Disease: on calcitriol and sevelamer 6.  Dispo: obs, possibly home soon?  Still on O2  Subjective:    For HD today.     Objective:   BP (!) 153/70 (BP Location: Right Arm)   Pulse 67   Temp 98.3 F (36.8 C) (Oral)   Resp 16   Ht 4' 11.5" (1.511 m)   Wt 42 kg   SpO2 100%   BMI 18.39 kg/m   Physical Exam: GEN  nad, sitting in bed, on O2 HEENT EOMI PERRL NECK no JVD today PULM still some crackles CV RRR ABD soft EXT no LE edema NEURO AAO x 3 nonfocal ACCESS LUE AVG and R internal jugular TDC  Labs: BMET Recent Labs  Lab 01/25/23 1000 01/26/23 0432 01/27/23 0422  NA 143 135 135  K 3.4* 4.0 3.9  CL 109 97* 97*  CO2 20* 25 22  GLUCOSE 130* 110* 99  BUN 74* 41* 84*  CREATININE 5.78* 3.46* 5.23*  CALCIUM 8.9 8.8* 8.5*  PHOS  --   --  6.4*   CBC Recent Labs  Lab 01/25/23 1000 01/26/23 0432  WBC 14.0* 9.4  NEUTROABS 9.3*  --   HGB 12.0 12.6  HCT 36.7 37.4  MCV 83.6 80.8  PLT 381 393      Medications:     amLODipine  10 mg Oral Daily   arformoterol  15 mcg Nebulization BID   atorvastatin  40 mg Oral QHS   budesonide (PULMICORT) nebulizer solution  0.5 mg Nebulization BID   calcitRIOL  0.75 mcg Oral Q T,Th,Sa-HD   Chlorhexidine Gluconate Cloth  6 each Topical Q0600    furosemide  80 mg Oral Daily   heparin  5,000 Units Subcutaneous Q8H   hydrALAZINE  50 mg Oral BID   ipratropium-albuterol  3 mL Nebulization BID   metoprolol succinate  25 mg Oral Daily   multivitamin  1 tablet Oral Daily   pantoprazole  40 mg Oral BID   predniSONE  40 mg Oral Q breakfast   sertraline  50 mg Oral Daily   sevelamer carbonate  800 mg Oral TID WC     Bufford Buttner MD 01/27/2023, 10:16 AM

## 2023-01-27 NOTE — Progress Notes (Signed)
   HEMODIALYSIS TREATMENT NOTE:  3 hour heparin-free treatment completed.  Goal met: 2.1 liters removed.  All blood was returned.    01/27/23 1630  Vitals  Temp 98 F (36.7 C)  Temp Source Oral  BP (!) 158/62  MAP (mmHg) 88  BP Location Right Arm  BP Method Automatic  Patient Position (if appropriate) Sitting  Pulse Rate 79  Pulse Rate Source Monitor  ECG Heart Rate 78  Resp 18  Oxygen Therapy  SpO2 99 %  O2 Device Room Air  Hepatitis B Pre Treatment Patient Checks  Hepatitis B Surface Antigen Results Pending (labs drawn, awaiting results)  Date Hepatitis B Surface Antigen Drawn 01/27/23  Hep B Antibody Quant/Post <3.5  Date Hep B Antibody Quant/Post Drawn 01/25/23  Patient's Immunity Status Unknown  Isolation Initiated Unknown Hepatitis status  Fistula / Graft Left Upper arm Arteriovenous vein graft  Placement Date/Time: 11/29/22 1428   Orientation: Left  Access Location: Upper arm  Access Type: Arteriovenous vein graft  Fistula / Graft Assessment Thrill;Bruit  Status Patent  Hemodialysis Catheter Right Internal jugular Double lumen Permanent (Tunneled)  Placement Date/Time: 05/24/22 0953   Serial / Lot #: [id number]  Expiration Date: 02/03/27  Time Out: Correct patient;Correct site;Correct procedure  Maximum sterile barrier precautions: Hand hygiene;Cap;Mask;Sterile gown;Sterile gloves;Large sterile ...  Blue Lumen Status Flushed;Heparin locked;Dead end cap in place  Red Lumen Status Flushed;Heparin locked;Dead end cap in place  Purple Lumen Status N/A  Catheter fill solution Heparin 1000 units/ml  Catheter fill volume (Arterial) 1.6 cc  Catheter fill volume (Venous) 1.6  Dressing Type Transparent;Tube stabilization device  Dressing Status Antimicrobial disc in place;Clean, Dry, Intact  Drainage Description None  Dressing Change Due 02/01/23  Post treatment catheter status Capped and Clamped    Arman Filter, RN [facility]

## 2023-01-27 NOTE — Care Management Important Message (Signed)
Important Message  Patient Details  Name: Sheri Cooper MRN: [id number] Date of Birth: [birth year]   Important Message Given:  N/A - LOS <3 / Initial given by admissions     Corey Harold 01/27/2023, 11:30 AM

## 2023-01-27 NOTE — TOC Initial Note (Signed)
Transition of Care [facility]) - Initial/Assessment Note    Patient Details  Name: Sheri Cooper MRN: [id number] Date of Birth: April 27, 1951  Transition of Care [facility]) CM/SW Contact:    Villa Herb, LCSWA Phone Number: 01/27/2023, 10:20 AM  Clinical Narrative:                 Pt is high risk for readmission. CSW spoke with pt to complete assessment. Pt has been staying with her brother recently. Pt is independent in completing her ADLs. Pt states that she has transportation when needed. Pt has had HH in the past but not currently. Pt has a cane to use if needed. TOC to follow.   Expected Discharge Plan: Home/Self Care Barriers to Discharge: No Barriers Identified   Patient Goals and CMS Choice Patient states their goals for this hospitalization and ongoing recovery are:: return home CMS Medicare.gov Compare Post Acute Care list provided to:: Patient Choice offered to / list presented to : Patient      Expected Discharge Plan and Services In-house Referral: Clinical Social Work Discharge Planning Services: CM Consult   Living arrangements for the past 2 months: Single Family Home                                      Prior Living Arrangements/Services Living arrangements for the past 2 months: Single Family Home Lives with:: Siblings Patient language and need for interpreter reviewed:: Yes Do you feel safe going back to the place where you live?: Yes      Need for Family Participation in Patient Care: Yes (Comment) Care giver support system in place?: Yes (comment)   Criminal Activity/Legal Involvement Pertinent to Current Situation/Hospitalization: No - Comment as needed  Activities of Daily Living   ADL Screening (condition at time of admission) Does the patient have a NEW difficulty with bathing/dressing/toileting/self-feeding that is expected to last >3 days?: No Does the patient have a NEW difficulty with getting in/out of bed, walking, or climbing stairs  that is expected to last >3 days?: No Does the patient have a NEW difficulty with communication that is expected to last >3 days?: No Is the patient deaf or have difficulty hearing?: No Does the patient have difficulty seeing, even when wearing glasses/contacts?: No Does the patient have difficulty concentrating, remembering, or making decisions?: No  Permission Sought/Granted                  Emotional Assessment Appearance:: Appears stated age Attitude/Demeanor/Rapport: Engaged Affect (typically observed): Accepting Orientation: : Oriented to Self, Oriented to Place, Oriented to  Time, Oriented to Situation Alcohol / Substance Use: Not Applicable Psych Involvement: No (comment)  Admission diagnosis:  Fluid overload [E87.70] Patient Active Problem List   Diagnosis Date Noted   Fluid overload 01/25/2023   COPD with acute exacerbation (HCC) 01/25/2023   Primary localized osteoarthrosis of multiple sites 09/14/2022   Weight loss 09/14/2022   Acute respiratory failure with hypoxia (HCC) 09/07/2022   Lobar pneumonia (HCC) 09/07/2022   ESRD (end stage renal disease) (HCC) 09/07/2022   Sepsis due to undetermined organism (HCC) 05/30/2022   Pneumoperitoneum 05/28/2022   Duodenal perforation (HCC) 05/28/2022   Peritonitis (HCC) 05/28/2022   Acute upper GI bleed 05/24/2022   Multiple duodenal ulcers 05/24/2022   ABLA (acute blood loss anemia) 05/24/2022   Melena 05/22/2022   HCAP (healthcare-associated pneumonia) 05/16/2022   Hypothermia 05/16/2022  Acute renal failure superimposed on stage 3b chronic kidney disease (HCC) 05/16/2022   Elevated brain natriuretic peptide (BNP) level 05/16/2022   Elevated troponin 05/16/2022   Increased anion gap metabolic acidosis 05/16/2022   Prolonged QT interval 05/16/2022   Hypoalbuminemia due to protein-calorie malnutrition (HCC) 05/16/2022   Iron deficiency anemia 05/16/2022   CKD (chronic kidney disease), stage V (HCC) 05/16/2022    Renal mass 05/14/2022   Simple renal cyst 05/14/2022   Vitamin D deficiency 05/14/2022   Chronic obstructive pulmonary disease with (acute) exacerbation (HCC) 05/07/2022   Pneumonia due to infectious organism 05/07/2022   Acute nontraumatic kidney injury (HCC) 05/07/2022   Mixed hyperlipidemia 05/07/2022   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   Primary hypertension 05/07/2022   Rheumatoid arthritis involving both hands with negative rheumatoid factor (HCC) 05/07/2022   Medication therapy management recommendation declined by patient 07/28/2020   Encounter for monitoring aromatase inhibitor therapy 06/12/2020   History of radiation therapy 06/12/2020   Involvement of right lateral surgical margin by neoplasm 04/16/2020   Status post breast lumpectomy 04/16/2020   Coordination of complex care 01/23/2020   Osteopenia of multiple sites 01/23/2020   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 - [place], [place] - [address] [place] [address] Phone: [phone] Fax: [phone]     Social Determinants of Health (SDOH) Social History: SDOH Screenings   Food Insecurity: No Food Insecurity (01/25/2023)  Housing: Low Risk  (01/25/2023)  Transportation Needs: No Transportation Needs (01/25/2023)  Utilities: Not At Risk (01/25/2023)  Alcohol Screen: Low Risk  (08/02/2022)  Depression (PHQ2-9): Low Risk  (08/02/2022)  Financial Resource Strain: Low Risk  (08/02/2022)  Physical Activity: Inactive (08/02/2022)  Social Connections: Unknown (08/02/2022)  Stress: No Stress Concern Present (08/02/2022)  Tobacco Use: Medium  Risk (01/25/2023)  Health Literacy: Low Risk  (08/11/2020)   Received from [facility]   SDOH Interventions:     Readmission Risk Interventions    01/27/2023   10:19 AM 05/18/2022   11:51 AM  Readmission Risk Prevention Plan  Transportation Screening Complete Complete  PCP or Specialist Appt within 5-7 Days  Not Complete  Home Care Screening  Complete  Medication Review (RN CM)  Complete  Medication Review (RN Care Manager) Complete   HRI or Home Care Consult Complete   SW Recovery Care/Counseling Consult Complete   Palliative Care Screening Not Applicable   Skilled Nursing Facility Not Applicable

## 2023-01-27 NOTE — Discharge Summary (Signed)
Physician Discharge Summary   Patient: Sheri Cooper MRN: [id number] DOB: 03-Nov-1950  Admit date:     01/25/2023  Discharge date: 01/27/23  Discharge Physician: Tyrone Nine   PCP: Richardean Chimera, MD   Recommendations at discharge:  Follow up with nephrology per routine, continue routine HD TTS (dialyzed last on day of discharge, 9/26). Follow up for HD catheter removal as outpatient.  Will need final dose of antibiotics (vancomycin 500mg  IV and cefepime 2g IV) on Saturday with dialysis.  Suggest formal PFTs once returned to baseline function, consider pulmonary evaluation given recurrent presentations.   Discharge Diagnoses: Principal Problem:   Fluid overload Active Problems:   ESRD (end stage renal disease) (HCC)   Mixed hyperlipidemia   Primary hypertension   Rheumatoid arthritis involving both hands with negative rheumatoid factor (HCC)   COPD with acute exacerbation [facility])  Hospital Course: [name] is a 72 y.o. 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 She was wheezing and tachypneic improved with supplemental oxygen. WBC 14.0, BNP 2267. Troponin 16 > 16. COVID-19 PCR was negative. Chest x-ray showed pulmonary vascular congestion, lung base opacities, bilateral pleural effusions. She was given steroids, nebulized therapies, and initiated on urgent hemodialysis that evening. Respiratory status has normalized, hypoxia resolved, and she is stable for discharge with plans to complete treatment for pneumonia and AECOPD after discharge.   Assessment and Plan: Acute hypoxic respiratory failure: Due to AECOPD with possible pneumonia, and volume overload (pulmonary edema, pleural effusions). - Wean oxygen as tolerated as long as SpO2 >89% and respiratory effort is normal.  - Treat as below   AECOPD with concern for bibasilar HCAP in immunosuppressed ESRD patient: PCT 1.41.   - Continue empiric abx coverage x5 days. Plan final dose with vancomycin and cefepime on Saturday's dialysis as outpatient.  - Continue steroids (prednisone) x5 total days > 2 more days after DC. - Suggest formal PFTs once returned to baseline function, consider pulmonary evaluation given recurrent presentations.    ESRD: Continue HD TTS accessing LUA AVG. Has been using for weeks, so recommend pulling HD catheter as planned as outpatient. No IR available on day of discharge to remove the line as inpatient.  Acute pulmonary edema, pleural effusions:  - Resolved clinically, suggest recheck CXR if symptoms return.    HTN:  - Continue home meds    RA:  - Continue chronic medications.   Hypokalemia: Manage with HD  Consultants: Nephrology Procedures performed: HD x2  Disposition: Home Diet recommendation:  Renal diet DISCHARGE MEDICATION: Allergies as of 01/27/2023   No Known Allergies      Medication List     STOP taking these medications    doxycycline 100 MG capsule Commonly known as: VIBRAMYCIN       TAKE these medications    amLODipine 10 MG tablet Commonly known as: NORVASC Take 10 mg by mouth daily.   atorvastatin 40 MG tablet Commonly known as: LIPITOR Take 40 mg by mouth at bedtime.   benzonatate 200 MG capsule Commonly known as: TESSALON Take 200 mg by mouth 3 (three) times daily as needed for cough.   ceFEPIme 2 g in sodium chloride 0.9 % 100 mL Inject 2 g into the vein Every Tuesday,Thursday,and Saturday with dialysis for 1 dose. Start taking on: January 29, 2023   hydrALAZINE 50 MG tablet Commonly known as: APRESOLINE Take 50 mg  by mouth 2 (two) times daily.   metoprolol succinate 25 MG 24 hr tablet Commonly known as: TOPROL-XL Take 25 mg by mouth daily.   multivitamin Tabs tablet Take 1 tablet by mouth daily.   pantoprazole 40 MG tablet Commonly known as: Protonix Take 1 tablet (40 mg total) by mouth 2 (two) times daily.    predniSONE 20 MG tablet Commonly known as: DELTASONE Take 2 tablets (40 mg total) by mouth daily with breakfast for 2 days. Start taking on: January 28, 2023   sertraline 50 MG tablet Commonly known as: ZOLOFT Take 50 mg by mouth daily.   sevelamer carbonate 800 MG tablet Commonly known as: RENVELA Take 800 mg by mouth 3 (three) times daily with meals.   vancomycin 500 MG/100ML IVPB Commonly known as: VANCOREADY Inject 100 mLs (500 mg total) into the vein Every Tuesday,Thursday,and Saturday with dialysis for 1 dose. Start taking on: January 29, 2023   Ventolin HFA 108 (90 Base) MCG/ACT inhaler Generic drug: albuterol Inhale 2 puffs into the lungs every 4 (four) hours as needed.        Follow-up Information     Richardean Chimera, MD Follow up.   Specialty: Family Medicine Contact information: [address] [place] [address] [phone]         [place], [facility] Follow up.   Contact information: [address] [place] [address] [phone]                Discharge Exam: Ceasar Mons Weights   01/25/23 1527 01/25/23 1700 01/25/23 2025  Weight: 44.5 kg 44.5 kg 42 kg  BP (!) 153/70 (BP Location: Right Arm)   Pulse 67   Temp 98.3 F (36.8 C) (Oral)   Resp 16   Ht 4' 11.5" (1.511 m)   Wt 42 kg   SpO2 100%   BMI 18.39 kg/m   Pleasant female in no distress Clear and nonlabored. No wheezes.  RRR, no MRG or dependent edema.  LUE AVG +thrill, R IJ TDC site c/d/i   Condition at discharge: stable  The results of significant diagnostics from this hospitalization (including imaging, microbiology, ancillary and laboratory) are listed below for reference.   Imaging Studies: DG Chest Port 1 View  Result Date: 01/25/2023 CLINICAL DATA:  Provided history: Shortness of breath. EXAM: PORTABLE CHEST 1 VIEW COMPARISON:  09/07/2022. Prior chest radiographs 09/07/2022 and earlier. FINDINGS: Right-sided dual lumen central venous catheter with  tip at the level of the right atrium. Mild cardiomegaly. Aortic atherosclerosis. Central pulmonary vascular congestion. Mild prominence of the intra lung markings suggesting interstitial edema. Small mild pleural effusions. Additional opacities at the bilateral lung bases which may reflect atelectasis and/or airspace consolidation. No evidence of pneumothorax. No acute osseous abnormality identified. IMPRESSION: 1. Cardiomegaly with pulmonary vascular congestion/interstitial edema. 2. Small bilateral pleural effusions. 3. Additional opacities at the bilateral lung bases, which may reflect atelectasis and/or airspace consolidation. 4. Aortic Atherosclerosis (ICD10-I70.0). Electronically Signed   By: Jackey Loge D.O.   On: 01/25/2023 12:32    Microbiology: Results for orders placed or performed during the hospital encounter of 01/25/23  SARS Coronavirus 2 by RT PCR (hospital order, performed in [facility] hospital lab) *cepheid single result test* Anterior Nasal Swab     Status: None   Collection Time: 01/25/23 10:03 AM   Specimen: Anterior Nasal Swab  Result Value Ref Range Status   SARS Coronavirus 2 by RT PCR NEGATIVE NEGATIVE Final  Comment: (NOTE) SARS-CoV-2 target nucleic acids are NOT DETECTED.  The SARS-CoV-2 RNA is generally detectable in upper and lower respiratory specimens during the acute phase of infection. The lowest concentration of SARS-CoV-2 viral copies this assay can detect is 250 copies / mL. A negative result does not preclude SARS-CoV-2 infection and should not be used as the sole basis for treatment or other patient management decisions.  A negative result may occur with improper specimen collection / handling, submission of specimen other than nasopharyngeal swab, presence of viral mutation(s) within the areas targeted by this assay, and inadequate number of viral copies (<250 copies / mL). A negative result must be combined with clinical observations, patient  history, and epidemiological information.  Fact Sheet for Patients:   RoadLapTop.co.za  Fact Sheet for Healthcare Providers: http://kim-miller.com/  This test is not yet approved or  cleared by the [place] FDA and has been authorized for detection and/or diagnosis of SARS-CoV-2 by FDA under an Emergency Use Authorization (EUA).  This EUA will remain in effect (meaning this test can be used) for the duration of the COVID-19 declaration under Section 564(b)(1) of the Act, 21 U.S.C. section 360bbb-3(b)(1), unless the authorization is terminated or revoked sooner.  Performed at [facility], [address], [place], [place] [address]     Labs: CBC: Recent Labs  Lab 01/25/23 1000 01/26/23 0432  WBC 14.0* 9.4  NEUTROABS 9.3*  --   HGB 12.0 12.6  HCT 36.7 37.4  MCV 83.6 80.8  PLT 381 393   Basic Metabolic Panel: Recent Labs  Lab 01/25/23 1000 01/26/23 0432 01/27/23 0422  NA 143 135 135  K 3.4* 4.0 3.9  CL 109 97* 97*  CO2 20* 25 22  GLUCOSE 130* 110* 99  BUN 74* 41* 84*  CREATININE 5.78* 3.46* 5.23*  CALCIUM 8.9 8.8* 8.5*  PHOS  --   --  6.4*   Liver Function Tests: Recent Labs  Lab 01/26/23 0432 01/27/23 0422  AST 30  --   ALT 30  --   ALKPHOS 163*  --   BILITOT 0.7  --   PROT 7.2  --   ALBUMIN 3.2* 3.2*   CBG: No results for input(s): "GLUCAP" in the last 168 hours.  Discharge time spent: greater than 30 minutes.  Signed: Tyrone Nine, MD [facility] 01/27/2023

## 2023-01-29 DIAGNOSIS — N186 End stage renal disease: Secondary | ICD-10-CM | POA: Diagnosis not present

## 2023-01-29 DIAGNOSIS — Z992 Dependence on renal dialysis: Secondary | ICD-10-CM | POA: Diagnosis not present

## 2023-01-31 DIAGNOSIS — Z992 Dependence on renal dialysis: Secondary | ICD-10-CM | POA: Diagnosis not present

## 2023-01-31 DIAGNOSIS — N186 End stage renal disease: Secondary | ICD-10-CM | POA: Diagnosis not present

## 2023-02-01 DIAGNOSIS — N186 End stage renal disease: Secondary | ICD-10-CM | POA: Diagnosis not present

## 2023-02-01 DIAGNOSIS — Z992 Dependence on renal dialysis: Secondary | ICD-10-CM | POA: Diagnosis not present

## 2023-02-02 DIAGNOSIS — Z452 Encounter for adjustment and management of vascular access device: Secondary | ICD-10-CM | POA: Diagnosis not present

## 2023-02-02 DIAGNOSIS — N186 End stage renal disease: Secondary | ICD-10-CM | POA: Diagnosis not present

## 2023-02-02 DIAGNOSIS — Z992 Dependence on renal dialysis: Secondary | ICD-10-CM | POA: Diagnosis not present

## 2023-02-03 ENCOUNTER — Emergency Department (HOSPITAL_COMMUNITY): Payer: Medicare Other

## 2023-02-03 ENCOUNTER — Encounter (HOSPITAL_COMMUNITY): Payer: Self-pay

## 2023-02-03 ENCOUNTER — Other Ambulatory Visit: Payer: Self-pay

## 2023-02-03 ENCOUNTER — Emergency Department (HOSPITAL_COMMUNITY)
Admission: EM | Admit: 2023-02-03 | Discharge: 2023-02-03 | Disposition: A | Discharge status: Home / Self Care | Payer: Medicare Other | Attending: Emergency Medicine | Admitting: Emergency Medicine

## 2023-02-03 DIAGNOSIS — J449 Chronic obstructive pulmonary disease, unspecified: Secondary | ICD-10-CM | POA: Diagnosis not present

## 2023-02-03 DIAGNOSIS — J90 Pleural effusion, not elsewhere classified: Secondary | ICD-10-CM | POA: Insufficient documentation

## 2023-02-03 DIAGNOSIS — Z743 Need for continuous supervision: Secondary | ICD-10-CM | POA: Diagnosis not present

## 2023-02-03 DIAGNOSIS — N186 End stage renal disease: Secondary | ICD-10-CM | POA: Insufficient documentation

## 2023-02-03 DIAGNOSIS — J441 Chronic obstructive pulmonary disease with (acute) exacerbation: Secondary | ICD-10-CM | POA: Diagnosis not present

## 2023-02-03 DIAGNOSIS — Z992 Dependence on renal dialysis: Secondary | ICD-10-CM | POA: Insufficient documentation

## 2023-02-03 DIAGNOSIS — R0602 Shortness of breath: Secondary | ICD-10-CM | POA: Diagnosis not present

## 2023-02-03 DIAGNOSIS — J9811 Atelectasis: Secondary | ICD-10-CM | POA: Diagnosis not present

## 2023-02-03 DIAGNOSIS — N250 Renal osteodystrophy: Secondary | ICD-10-CM | POA: Diagnosis not present

## 2023-02-03 DIAGNOSIS — I120 Hypertensive chronic kidney disease with stage 5 chronic kidney disease or end stage renal disease: Secondary | ICD-10-CM | POA: Diagnosis not present

## 2023-02-03 DIAGNOSIS — D631 Anemia in chronic kidney disease: Secondary | ICD-10-CM | POA: Diagnosis not present

## 2023-02-03 DIAGNOSIS — R6889 Other general symptoms and signs: Secondary | ICD-10-CM | POA: Diagnosis not present

## 2023-02-03 DIAGNOSIS — R069 Unspecified abnormalities of breathing: Secondary | ICD-10-CM | POA: Diagnosis not present

## 2023-02-03 DIAGNOSIS — R0689 Other abnormalities of breathing: Secondary | ICD-10-CM | POA: Diagnosis not present

## 2023-02-03 DIAGNOSIS — I10 Essential (primary) hypertension: Secondary | ICD-10-CM | POA: Diagnosis not present

## 2023-02-03 LAB — CBC WITH DIFFERENTIAL/PLATELET
Abs Immature Granulocytes: 0.43 10*3/uL — ABNORMAL HIGH (ref 0.00–0.07)
Basophils Absolute: 0.1 10*3/uL (ref 0.0–0.1)
Basophils Relative: 1 %
Eosinophils Absolute: 0.1 10*3/uL (ref 0.0–0.5)
Eosinophils Relative: 1 %
HCT: 34.1 % — ABNORMAL LOW (ref 36.0–46.0)
Hemoglobin: 11.3 g/dL — ABNORMAL LOW (ref 12.0–15.0)
Immature Granulocytes: 2 %
Lymphocytes Relative: 21 %
Lymphs Abs: 3.8 10*3/uL (ref 0.7–4.0)
MCH: 27.5 pg (ref 26.0–34.0)
MCHC: 33.1 g/dL (ref 30.0–36.0)
MCV: 83 fL (ref 80.0–100.0)
Monocytes Absolute: 1.2 10*3/uL — ABNORMAL HIGH (ref 0.1–1.0)
Monocytes Relative: 7 %
Neutro Abs: 12.5 10*3/uL — ABNORMAL HIGH (ref 1.7–7.7)
Neutrophils Relative %: 68 %
Platelets: 236 10*3/uL (ref 150–400)
RBC: 4.11 MIL/uL (ref 3.87–5.11)
RDW: 19.4 % — ABNORMAL HIGH (ref 11.5–15.5)
WBC: 18.2 10*3/uL — ABNORMAL HIGH (ref 4.0–10.5)
nRBC: 0 % (ref 0.0–0.2)

## 2023-02-03 LAB — COMPREHENSIVE METABOLIC PANEL
ALT: 101 U/L — ABNORMAL HIGH (ref 0–44)
AST: 110 U/L — ABNORMAL HIGH (ref 15–41)
Albumin: 3.2 g/dL — ABNORMAL LOW (ref 3.5–5.0)
Alkaline Phosphatase: 243 U/L — ABNORMAL HIGH (ref 38–126)
Anion gap: 14 (ref 5–15)
BUN: 67 mg/dL — ABNORMAL HIGH (ref 8–23)
CO2: 24 mmol/L (ref 22–32)
Calcium: 8.3 mg/dL — ABNORMAL LOW (ref 8.9–10.3)
Chloride: 104 mmol/L (ref 98–111)
Creatinine, Ser: 4.83 mg/dL — ABNORMAL HIGH (ref 0.44–1.00)
GFR, Estimated: 9 mL/min — ABNORMAL LOW (ref >60)
Glucose, Bld: 119 mg/dL — ABNORMAL HIGH (ref 70–99)
Potassium: 3.4 mmol/L — ABNORMAL LOW (ref 3.5–5.1)
Sodium: 142 mmol/L (ref 135–145)
Total Bilirubin: 0.6 mg/dL (ref 0.3–1.2)
Total Protein: 6.9 g/dL (ref 6.5–8.1)

## 2023-02-03 LAB — HEPATITIS B SURFACE ANTIGEN: Hepatitis B Surface Ag: NONREACTIVE

## 2023-02-03 MED ORDER — DOXYCYCLINE HYCLATE 100 MG PO CAPS: 100.0000 mg | ORAL_CAPSULE | Freq: Two times a day (BID) | ORAL | 0 refills | Status: DC

## 2023-02-03 MED ORDER — PREDNISONE 20 MG PO TABS: ORAL_TABLET | ORAL | 0 refills | Status: DC

## 2023-02-03 MED ORDER — LIDOCAINE HCL (PF) 1 % IJ SOLN: 5.0000 mL | INTRAMUSCULAR | Status: DC | PRN

## 2023-02-03 MED ORDER — PREDNISONE 50 MG PO TABS
60.0000 mg | ORAL_TABLET | Freq: Once | ORAL | Status: AC
  Administered 2023-02-03: 60 mg via ORAL
  Filled 2023-02-03: qty 1

## 2023-02-03 MED ORDER — DOXYCYCLINE HYCLATE 100 MG PO TABS
100.0000 mg | ORAL_TABLET | Freq: Once | ORAL | Status: AC
  Administered 2023-02-03: 100 mg via ORAL
  Filled 2023-02-03: qty 1

## 2023-02-03 MED ORDER — CHLORHEXIDINE GLUCONATE CLOTH 2 % EX PADS: 6.0000 | MEDICATED_PAD | Freq: Every day | CUTANEOUS | Status: DC

## 2023-02-03 MED ORDER — PENTAFLUOROPROP-TETRAFLUOROETH EX AERO: 1.0000 | INHALATION_SPRAY | CUTANEOUS | Status: DC | PRN

## 2023-02-03 MED ORDER — LIDOCAINE-PRILOCAINE 2.5-2.5 % EX CREA: 1.0000 | TOPICAL_CREAM | CUTANEOUS | Status: DC | PRN

## 2023-02-03 NOTE — Progress Notes (Signed)
  HEMODIALYSIS TREATMENT NOTE:  3.25 hour treatment ordered with 2-3 L goal. Removal of 3L was attempted however UFR had to be lowered twice: once, for 18-pt drop in SBP which made pt feel flushed and dizzy, then again for leg cramping unrelieved with massage and heat.  Net UF 2 liters.  All blood was returned and hemostasis was achieved in 15 minutes.  She remains mildly hypertensive and tachypneic but reports breathing is "much better."  Post-treatment:  02/03/23 1515  Vitals  Temp 98.1 F (36.7 C)  Temp Source Oral  BP (!) 189/55  MAP (mmHg) 94  BP Location Right Arm  BP Method Automatic  Patient Position (if appropriate) Sitting  Pulse Rate 84  Pulse Rate Source Monitor  ECG Heart Rate 84  Resp (!) 22  Oxygen Therapy  SpO2 100 %  O2 Device Room Air  Post Treatment  Dialyzer Clearance Lightly streaked  Hemodialysis Intake (mL) 200 mL  Liters Processed 59  Fluid Removed (mL) 2000 mL  Tolerated HD Treatment No (Comment)  Post-Hemodialysis Comments See PN  AVG/AVF Arterial Site Held (minutes) 10 minutes  AVG/AVF Venous Site Held (minutes) 10 minutes  Fistula / Graft Left Upper arm Arteriovenous vein graft  Placement Date/Time: 11/29/22 1428   Orientation: Left  Access Location: Upper arm  Access Type: Arteriovenous vein graft  Fistula / Graft Assessment Thrill;Bruit  Status Patent   Pt was transported from [facility] back to [facility] to await disposition.  Hand-off given to Orson Slick, RN.  Arman Filter, RN [facility]

## 2023-02-03 NOTE — ED Notes (Signed)
Pt to have dialysis this morning. EDP informed of bp.

## 2023-02-03 NOTE — ED Triage Notes (Signed)
Arrives EMS from home. As pt went to bed tonight she began feeling short of breath. Upon paramedic arrival pt sounded diminished with wheezing.   Received a duo neb in transport to ED with no improvement per transport crew.   Dialysis T. TH, S.

## 2023-02-03 NOTE — ED Provider Notes (Signed)
[place] EMERGENCY DEPARTMENT AT [facility] Provider Note   CSN: [id number] Arrival date & time: 02/03/23  0137     History  Chief Complaint  Patient presents with   Shortness of Breath    Sheri Cooper is a 72 y.o. female.  72 year old female end-stage renal disease on dialysis the presents ER today with shortness of breath.  She came via EMS.  EMS states that she had wheezing on arrival and some decreased breath sounds and states that they gave her 1 DuoNeb and she sounds much better now.  Not hypoxic but is tachypneic.  She is hypertensive.  Due for dialysis today.  She states she feels much better now than she did earlier.  She denies fever.   Shortness of Breath      Home Medications Prior to Admission medications   Medication Sig Start Date End Date Taking? Authorizing Provider  doxycycline (VIBRAMYCIN) 100 MG capsule Take 1 capsule (100 mg total) by mouth 2 (two) times daily. One po bid x 7 days 02/03/23  Yes Marene Gilliam, Barbara Cower, MD  predniSONE (DELTASONE) 20 MG tablet 2 tabs po daily x 4 days 02/03/23  Yes Janicia Monterrosa, Barbara Cower, MD  amLODipine (NORVASC) 10 MG tablet Take 10 mg by mouth daily.    [provider]  atorvastatin (LIPITOR) 40 MG tablet Take 40 mg by mouth at bedtime. 09/22/22   [provider]  benzonatate (TESSALON) 200 MG capsule Take 200 mg by mouth 3 (three) times daily as needed for cough. 01/20/23   [provider]  hydrALAZINE (APRESOLINE) 50 MG tablet Take 50 mg by mouth 2 (two) times daily. 07/14/22   [provider]  metoprolol succinate (TOPROL-XL) 25 MG 24 hr tablet Take 25 mg by mouth daily. 10/15/22   [provider]  multivitamin (RENA-VIT) TABS tablet Take 1 tablet by mouth daily. 10/15/22   [provider]  pantoprazole (PROTONIX) 40 MG tablet Take 1 tablet (40 mg total) by mouth 2 (two) times daily. 06/05/22 06/05/23  Vassie Loll, MD  sertraline (ZOLOFT) 50 MG tablet Take 50 mg by mouth  daily. 05/12/22 01/26/23  [provider]  sevelamer carbonate (RENVELA) 800 MG tablet Take 800 mg by mouth 3 (three) times daily with meals. 11/23/22   [provider]  VENTOLIN HFA 108 (90 Base) MCG/ACT inhaler Inhale 2 puffs into the lungs every 4 (four) hours as needed. 01/20/23   [provider]      Allergies    Patient has no known allergies.    Review of Systems   Review of Systems  Respiratory:  Positive for shortness of breath.     Physical Exam Updated Vital Signs BP (!) 204/59   Pulse 75   Temp 98 F (36.7 C) (Oral)   Resp 18   Ht 4' 11.5" (1.511 m)   Wt 44.5 kg   SpO2 96%   BMI 19.46 kg/m  Physical Exam Vitals and nursing note reviewed.  Constitutional:      Appearance: She is well-developed.  HENT:     Head: Normocephalic and atraumatic.  Cardiovascular:     Rate and Rhythm: Normal rate and regular rhythm.  Pulmonary:     Effort: Tachypnea present. No respiratory distress.     Breath sounds: No stridor. Decreased breath sounds and wheezing (Minimal scattered) present.  Abdominal:     General: There is no distension.  Musculoskeletal:     Cervical back: Normal range of motion.  Neurological:  Mental Status: She is alert.     ED Results / Procedures / Treatments   Labs (all labs ordered are listed, but only abnormal results are displayed) Labs Reviewed  CBC WITH DIFFERENTIAL/PLATELET - Abnormal; Notable for the following components:      Result Value   WBC 18.2 (*)    Hemoglobin 11.3 (*)    HCT 34.1 (*)    RDW 19.4 (*)    Neutro Abs 12.5 (*)    Monocytes Absolute 1.2 (*)    Abs Immature Granulocytes 0.43 (*)    All other components within normal limits  COMPREHENSIVE METABOLIC PANEL - Abnormal; Notable for the following components:   Potassium 3.4 (*)    Glucose, Bld 119 (*)    BUN 67 (*)    Creatinine, Ser 4.83 (*)    Calcium 8.3 (*)    Albumin 3.2 (*)    AST 110 (*)    ALT 101 (*)    Alkaline Phosphatase 243  (*)    GFR, Estimated 9 (*)    All other components within normal limits    EKG EKG Interpretation Date/Time:  Thursday February 03 2023 01:46:54 EDT Ventricular Rate:  84 PR Interval:  118 QRS Duration:  71 QT Interval:  424 QTC Calculation: 502 R Axis:   60  Text Interpretation: Sinus rhythm Borderline short PR interval Borderline ST depression, diffuse leads Prolonged QT interval Confirmed by Vanetta Mulders [id number]) on 02/03/2023 7:56:03 AM  Radiology DG Chest Portable 1 View  Result Date: 02/03/2023 CLINICAL DATA:  Shortness of breath EXAM: PORTABLE CHEST 1 VIEW COMPARISON:  01/25/2023 FINDINGS: Small pleural effusions and basilar atelectasis, right greater than left. Cardiomediastinal contours are normal. No pneumothorax. Left breast/axillary surgical clips. IMPRESSION: Small pleural effusions and basilar atelectasis, right greater than left. Electronically Signed   By: Deatra Robinson M.D.   On: 02/03/2023 02:13    Procedures Procedures    Medications Ordered in ED Medications  doxycycline (VIBRA-TABS) tablet 100 mg (100 mg Oral Given 02/03/23 0346)  predniSONE (DELTASONE) tablet 60 mg (60 mg Oral Given 02/03/23 0346)    ED Course/ Medical Decision Making/ A&P                                 Medical Decision Making Amount and/or Complexity of Data Reviewed Labs: ordered. Radiology: ordered. ECG/medicine tests: ordered.  Risk Prescription drug management.   X-ray shows pleural effusions.  She still tachypneic however her lungs are clear and she says she still feels better.  She is pretty hypertensive.  She is due for dialysis today.  If she was not tachypneic I be okay just let her go home and go to her normal dialysis however with the pleural effusions tachypnea with would prefer she gets dialysis here and then we can reevaluate her afterwards to make sure she is doing better from a respiratory standpoint.  Otherwise she will need to be on antibiotics and a steroid  burst with PCP follow-up for COPD exacerbation/bronchitis.  Discussed with Dr. Barbee Cough about get dialysis today.  He will put in orders for dialysis hopefully this morning and that she can return to the ER for reevaluation.  Suspect she will be able to go home as her blood pressures improved and respiratory rate is improved.  Antibiotics and steroids sent.  Will need to follow-up with PCP in a few days to make sure she is improving from a  COPD standpoint.  Care transferred pending reevaluation after dialysis.        Final Clinical Impression(s) / ED Diagnoses Final diagnoses:  COPD exacerbation (HCC)  Shortness of breath  Pleural effusion    Rx / DC Orders ED Discharge Orders          Ordered    doxycycline (VIBRAMYCIN) 100 MG capsule  2 times daily        02/03/23 0803    predniSONE (DELTASONE) 20 MG tablet        02/03/23 0803              Yajaira Doffing, Barbara Cower, MD 02/03/23 9528

## 2023-02-03 NOTE — ED Provider Notes (Signed)
Patient status post dialysis.  Patient is breathing much better.  She feels much more comfortable.  Oxygen saturations are 98% and she is not tachypneic.  Dialysis nurse also noted that as they were dialyzing her that her breathing became much better.  Patient seems to be stable for discharge home has dialysis again on Saturday.  She also will follow-up with her regular doctor regarding some of the pleural effusions.  And patient has some hepatitis panel labs pending still.   Vanetta Mulders, MD 02/03/23 [phone]

## 2023-02-03 NOTE — ED Notes (Addendum)
Pt's brother called to check on pt and expressed concern that pt is scheduled for dialysis today. Brother informed that plan is for pt to have dialysis here at hospital and be reassessed in ER before going home. Understanding verbalized.

## 2023-02-03 NOTE — ED Notes (Signed)
Pt being transported to dialysis.

## 2023-02-03 NOTE — ED Notes (Signed)
Pt assisted to bathroom

## 2023-02-03 NOTE — Consult Note (Signed)
[facility] Renal Consultation Note    Indication for Consultation:  Management of ESRD/hemodialysis; anemia, hypertension/volume and secondary hyperparathyroidism  HPI: Sheri Cooper is a 72 y.o. female with a PMH sig for ESRD on HD TTS, COPD, PUD, and recurrent PNA who presented to [facility] ED via EMS today c/o SOB and wheezing.  She was not hypoxic and vital signs stable.  She is due for HD today and we were consulted to provide HD and then have her re-evaluated by the ED MD to see if she can be discharged to home afterwards.  Pt was just recently discharged with similar presentation  on 01/27/23.  She did go to her outpatient HD session on Saturday and Tuesday.  Past Medical History:  Diagnosis Date   Anemia    Chronic kidney disease (CKD), stage IV (severe) (HCC)    COPD (chronic obstructive pulmonary disease) (HCC)    ESRD (end stage renal disease) (HCC)    Hemodialysis T, TH, SAT, [address] , [place]   GERD (gastroesophageal reflux disease)    History of blood transfusion    History of blood transfusion    Hypertension    Pneumonia    3 times in 2024-   PUD (peptic ulcer disease) 05/2022   Rheumatoid arthritis (HCC)    Rheumatoid arthritis (HCC)    Past Surgical History:  Procedure Laterality Date   AV FISTULA PLACEMENT Left 11/29/2022   Procedure: INSERTION OF LEFT UPPER  ARTERIOVENOUS (AV) GORE-TEX GRAFT ARM;  Surgeon: Victorino Sparrow, MD;  Location: [facility] OR;  Service: Vascular;  Laterality: Left;   CENTRAL VENOUS CATHETER INSERTION Left 05/27/2022   Procedure: INSERTION CENTRAL LINE ADULT;  Surgeon: Lucretia Roers, MD;  Location: [facility] ORS;  Service: General;  Laterality: Left;   COLONOSCOPY WITH PROPOFOL N/A 05/22/2022   Surgeon: Marguerita Merles, Reuel Boom, MD; blood in the terminal ileum and colon.   ESOPHAGOGASTRODUODENOSCOPY (EGD) WITH PROPOFOL N/A 05/22/2022   Surgeon: Marguerita Merles, Reuel Boom, MD; peptic ulcer disease with 2 nonbleeding cratered gastric  ulcers, multiple duodenal ulcers, 2 of which had adherent clots that were removed and revealed 2 vessels.  1 vessel was clipped, cauterized, and treated with epinephrine injection.  Second vessel was clipped and treated with cautery, clip fell off.   IR FLUORO GUIDE CV LINE RIGHT  05/24/2022   IR US GUIDE VASC ACCESS RIGHT  05/24/2022   LAPAROTOMY N/A 05/27/2022   Procedure: EXPLORATORY LAPAROTOMY, OMENTAL PATH, DRAIN PLACEMENT;  Surgeon: Lucretia Roers, MD;  Location: [facility] ORS;  Service: General;  Laterality: N/A;   VOLVULUS REDUCTION     colon resection   Family History:   History reviewed. No pertinent family history. Social History:  reports that she has quit smoking. Her smoking use included cigarettes. She has been exposed to tobacco smoke. She has never used smokeless tobacco. She reports that she does not currently use alcohol. She reports that she does not use drugs. No Known Allergies Prior to Admission medications   Medication Sig Start Date End Date Taking? Authorizing Provider  doxycycline (VIBRAMYCIN) 100 MG capsule Take 1 capsule (100 mg total) by mouth 2 (two) times daily. One po bid x 7 days 02/03/23  Yes Mesner, Barbara Cower, MD  predniSONE (DELTASONE) 20 MG tablet 2 tabs po daily x 4 days 02/03/23  Yes Mesner, Barbara Cower, MD  amLODipine (NORVASC) 10 MG tablet Take 10 mg by mouth daily.    [provider]  atorvastatin (LIPITOR) 40 MG tablet Take 40 mg by  mouth at bedtime. 09/22/22   [provider]  benzonatate (TESSALON) 200 MG capsule Take 200 mg by mouth 3 (three) times daily as needed for cough. 01/20/23   [provider]  hydrALAZINE (APRESOLINE) 50 MG tablet Take 50 mg by mouth 2 (two) times daily. 07/14/22   [provider]  metoprolol succinate (TOPROL-XL) 25 MG 24 hr tablet Take 25 mg by mouth daily. 10/15/22   [provider]  multivitamin (RENA-VIT) TABS tablet Take 1 tablet by mouth daily. 10/15/22   [provider]   pantoprazole (PROTONIX) 40 MG tablet Take 1 tablet (40 mg total) by mouth 2 (two) times daily. 06/05/22 06/05/23  Vassie Loll, MD  sertraline (ZOLOFT) 50 MG tablet Take 50 mg by mouth daily. 05/12/22 01/26/23  [provider]  sevelamer carbonate (RENVELA) 800 MG tablet Take 800 mg by mouth 3 (three) times daily with meals. 11/23/22   [provider]  VENTOLIN HFA 108 (90 Base) MCG/ACT inhaler Inhale 2 puffs into the lungs every 4 (four) hours as needed. 01/20/23   [provider]   Current Facility-Administered Medications  Medication Dose Route Frequency Provider Last Rate Last Admin   Chlorhexidine Gluconate Cloth 2 % PADS 6 each  6 each Topical Q0600 Terrial Rhodes, MD       Current Outpatient Medications  Medication Sig Dispense Refill   doxycycline (VIBRAMYCIN) 100 MG capsule Take 1 capsule (100 mg total) by mouth 2 (two) times daily. One po bid x 7 days 14 capsule 0   predniSONE (DELTASONE) 20 MG tablet 2 tabs po daily x 4 days 8 tablet 0   amLODipine (NORVASC) 10 MG tablet Take 10 mg by mouth daily.     atorvastatin (LIPITOR) 40 MG tablet Take 40 mg by mouth at bedtime.     benzonatate (TESSALON) 200 MG capsule Take 200 mg by mouth 3 (three) times daily as needed for cough.     hydrALAZINE (APRESOLINE) 50 MG tablet Take 50 mg by mouth 2 (two) times daily.     metoprolol succinate (TOPROL-XL) 25 MG 24 hr tablet Take 25 mg by mouth daily.     multivitamin (RENA-VIT) TABS tablet Take 1 tablet by mouth daily.     pantoprazole (PROTONIX) 40 MG tablet Take 1 tablet (40 mg total) by mouth 2 (two) times daily. 60 tablet 1   sertraline (ZOLOFT) 50 MG tablet Take 50 mg by mouth daily.     sevelamer carbonate (RENVELA) 800 MG tablet Take 800 mg by mouth 3 (three) times daily with meals.     VENTOLIN HFA 108 (90 Base) MCG/ACT inhaler Inhale 2 puffs into the lungs every 4 (four) hours as needed.     Labs: Basic Metabolic Panel: Recent Labs  Lab 02/03/23 0211  NA  142  K 3.4*  CL 104  CO2 24  GLUCOSE 119*  BUN 67*  CREATININE 4.83*  CALCIUM 8.3*   Liver Function Tests: Recent Labs  Lab 02/03/23 0211  AST 110*  ALT 101*  ALKPHOS 243*  BILITOT 0.6  PROT 6.9  ALBUMIN 3.2*   No results for input(s): "LIPASE", "AMYLASE" in the last 168 hours. No results for input(s): "AMMONIA" in the last 168 hours. CBC: Recent Labs  Lab 02/03/23 0211  WBC 18.2*  NEUTROABS 12.5*  HGB 11.3*  HCT 34.1*  MCV 83.0  PLT 236   Cardiac Enzymes: No results for input(s): "CKTOTAL", "CKMB", "CKMBINDEX", "TROPONINI" in the last 168 hours. CBG: No results for input(s): "GLUCAP" in  the last 168 hours. Iron Studies: No results for input(s): "IRON", "TIBC", "TRANSFERRIN", "FERRITIN" in the last 72 hours. Studies/Results: DG Chest Portable 1 View  Result Date: 02/03/2023 CLINICAL DATA:  Shortness of breath EXAM: PORTABLE CHEST 1 VIEW COMPARISON:  01/25/2023 FINDINGS: Small pleural effusions and basilar atelectasis, right greater than left. Cardiomediastinal contours are normal. No pneumothorax. Left breast/axillary surgical clips. IMPRESSION: Small pleural effusions and basilar atelectasis, right greater than left. Electronically Signed   By: Deatra Robinson M.D.   On: 02/03/2023 02:13    ROS: Pertinent items are noted in HPI. Physical Exam: Vitals:   02/03/23 0650 02/03/23 0700 02/03/23 0730 02/03/23 0830  BP: (!) 177/72 (!) 199/69 (!) 204/59 (!) 187/72  Pulse: 87 77 75 74  Resp: (!) 25 18 18 20   Temp:   98 F (36.7 C)   TempSrc:   Oral   SpO2: 91% 94% 96% 95%  Weight:      Height:          Weight change:  No intake or output data in the 24 hours ending 02/03/23 0941 BP (!) 187/72   Pulse 74   Temp 98 F (36.7 C) (Oral)   Resp 20   Ht 4' 11.5" (1.511 m)   Wt 44.5 kg   SpO2 95%   BMI 19.46 kg/m  General appearance: alert, cooperative, and no distress Head: Normocephalic, without obvious abnormality, atraumatic Resp: diminished breath sounds  bilaterally Cardio: regular rate and rhythm, S1, S2 normal, no murmur, click, rub or gallop GI: soft, non-tender; bowel sounds normal; no masses,  no organomegaly Extremities: extremities normal, atraumatic, no cyanosis or edema and LUE AVG +T/B Dialysis Access:  Dialysis Orders: Center: [facility]. 3hrs . EDW 42.5 kg. Flow rates: 350/500. 3K/2.5 calcium. Heparin: None. Access:  LUE AVG. Meds: Calcitriol 0.75 mcg every treatment, Mircera 75 mcg every 4 weeks (last dose 9/10), Venofer 50 mg once weekly (due today).   Assessment/Plan:  SOB- CXR with pleural effusions and ongoing tachypnea.  Plan for HD with UF today and see how she responds.  She may need lower edw.  ESRD -  as above.  Plan for HD today and re-evaluate in ED for possible discharge  Hypertension/volume  -  UF as tolerated.  She is only 2 kg above her edw.  Will challenge edw today.  Anemia  -  stable  Metabolic bone disease -   continue with home meds  Nutrition -  renal diet  Irena Cords, MD [facility], [facility]  02/03/2023, 9:41 AM

## 2023-02-03 NOTE — ED Notes (Signed)
Pt returned from dialysis

## 2023-02-03 NOTE — Discharge Instructions (Addendum)
Follow-up for dialysis on Saturday.  Glad that your breathing feels much better.  Also follow-up with your regular doctor.  Return for any new or worse symptoms.

## 2023-02-04 LAB — HEPATITIS B SURFACE ANTIBODY, QUANTITATIVE: Hep B S AB Quant (Post): <3.5 m[IU]/mL — ABNORMAL LOW

## 2023-02-05 DIAGNOSIS — N186 End stage renal disease: Secondary | ICD-10-CM | POA: Diagnosis not present

## 2023-02-05 DIAGNOSIS — Z992 Dependence on renal dialysis: Secondary | ICD-10-CM | POA: Diagnosis not present

## 2023-02-07 DIAGNOSIS — Z23 Encounter for immunization: Secondary | ICD-10-CM | POA: Diagnosis not present

## 2023-02-07 DIAGNOSIS — H6123 Impacted cerumen, bilateral: Secondary | ICD-10-CM | POA: Diagnosis not present

## 2023-02-08 DIAGNOSIS — N186 End stage renal disease: Secondary | ICD-10-CM | POA: Diagnosis not present

## 2023-02-08 DIAGNOSIS — Z992 Dependence on renal dialysis: Secondary | ICD-10-CM | POA: Diagnosis not present

## 2023-02-10 DIAGNOSIS — N186 End stage renal disease: Secondary | ICD-10-CM | POA: Diagnosis not present

## 2023-02-10 DIAGNOSIS — Z992 Dependence on renal dialysis: Secondary | ICD-10-CM | POA: Diagnosis not present

## 2023-02-12 DIAGNOSIS — Z992 Dependence on renal dialysis: Secondary | ICD-10-CM | POA: Diagnosis not present

## 2023-02-12 DIAGNOSIS — N186 End stage renal disease: Secondary | ICD-10-CM | POA: Diagnosis not present

## 2023-02-15 DIAGNOSIS — Z992 Dependence on renal dialysis: Secondary | ICD-10-CM | POA: Diagnosis not present

## 2023-02-15 DIAGNOSIS — N186 End stage renal disease: Secondary | ICD-10-CM | POA: Diagnosis not present

## 2023-02-17 DIAGNOSIS — Z992 Dependence on renal dialysis: Secondary | ICD-10-CM | POA: Diagnosis not present

## 2023-02-17 DIAGNOSIS — N186 End stage renal disease: Secondary | ICD-10-CM | POA: Diagnosis not present

## 2023-02-19 DIAGNOSIS — Z992 Dependence on renal dialysis: Secondary | ICD-10-CM | POA: Diagnosis not present

## 2023-02-19 DIAGNOSIS — N186 End stage renal disease: Secondary | ICD-10-CM | POA: Diagnosis not present

## 2023-02-22 DIAGNOSIS — N186 End stage renal disease: Secondary | ICD-10-CM | POA: Diagnosis not present

## 2023-02-22 DIAGNOSIS — Z992 Dependence on renal dialysis: Secondary | ICD-10-CM | POA: Diagnosis not present

## 2023-02-24 DIAGNOSIS — I10 Essential (primary) hypertension: Secondary | ICD-10-CM | POA: Diagnosis not present

## 2023-02-24 DIAGNOSIS — Z992 Dependence on renal dialysis: Secondary | ICD-10-CM | POA: Diagnosis not present

## 2023-02-24 DIAGNOSIS — E785 Hyperlipidemia, unspecified: Secondary | ICD-10-CM | POA: Diagnosis not present

## 2023-02-24 DIAGNOSIS — N186 End stage renal disease: Secondary | ICD-10-CM | POA: Diagnosis not present

## 2023-02-24 DIAGNOSIS — J441 Chronic obstructive pulmonary disease with (acute) exacerbation: Secondary | ICD-10-CM | POA: Diagnosis not present

## 2023-03-01 DIAGNOSIS — Z992 Dependence on renal dialysis: Secondary | ICD-10-CM | POA: Diagnosis not present

## 2023-03-01 DIAGNOSIS — N186 End stage renal disease: Secondary | ICD-10-CM | POA: Diagnosis not present

## 2023-03-03 DIAGNOSIS — Z992 Dependence on renal dialysis: Secondary | ICD-10-CM | POA: Diagnosis not present

## 2023-03-03 DIAGNOSIS — N186 End stage renal disease: Secondary | ICD-10-CM | POA: Diagnosis not present

## 2023-03-05 DIAGNOSIS — Z992 Dependence on renal dialysis: Secondary | ICD-10-CM | POA: Diagnosis not present

## 2023-03-05 DIAGNOSIS — N186 End stage renal disease: Secondary | ICD-10-CM | POA: Diagnosis not present

## 2023-03-07 ENCOUNTER — Telehealth: Payer: Self-pay | Admitting: *Deleted

## 2023-03-07 ENCOUNTER — Ambulatory Visit: Payer: Medicare Other | Admitting: *Deleted

## 2023-03-07 ENCOUNTER — Encounter: Payer: Self-pay | Admitting: *Deleted

## 2023-03-07 NOTE — Patient Instructions (Signed)
Visit Information  Thank you for taking time to visit with me today. Please don't hesitate to contact me if I can be of assistance to you.   Following are the goals we discussed today:   Goals Addressed               This Visit's Progress     Assess Need for Social Work Involvement. (pt-stated)   On track     Care Coordination Interventions:  Interventions Today    Flowsheet Row Most Recent Value  Chronic Disease   Chronic disease during today's visit Hypertension (HTN), Chronic Obstructive Pulmonary Disease (COPD), Other, Chronic Kidney Disease/End Stage Renal Disease (ESRD)  [Acute Respiratory Failure with Hypoxia, Hypoalbuminermia Due to Protein-Calorie Malnutrition]  General Interventions   General Interventions Discussed/Reviewed General Interventions Discussed, Labs, Vaccines, Doctor Visits, Health Screening, Annual Foot Exam, General Interventions Reviewed, Lipid Profile, Annual Eye Exam, Durable Medical Equipment (DME), Community Resources, Level of Care, Communication with  [Communication with Care Team Members]  Labs Hgb A1c every 3 months, Kidney Function  [Encouraged]  Vaccines COVID-19, Flu, Pneumonia, RSV, Shingles, Tetanus/Pertussis/Diphtheria  [Encouraged]  Doctor Visits Discussed/Reviewed Doctor Visits Discussed, Specialist, Doctor Visits Reviewed, Annual Wellness Visits, PCP  [Encouraged]  Health Screening Bone Density, Colonoscopy, Mammogram  [Encouraged]  Durable Medical Equipment (DME) BP Cuff, Walker, Wheelchair, Other  [Prescription Glasses, Scales, Cane]  Wheelchair Standard  PCP/Specialist Visits Compliance with follow-up visit  [Encouraged]  Communication with PCP/Specialists, Charity fundraiser, Pharmacists, Social Work  [Encouraged]  Exercise Interventions   Exercise Discussed/Reviewed Exercise Discussed, Education administrator use and maintanence, Exercise Reviewed, Physical Activity, Weight Managment  [Encouraged]  Physical Activity Discussed/Reviewed Physical Activity  Discussed, Home Exercise Program (HEP), PREP, Physical Activity Reviewed, Gym, Types of exercise  [Encouraged]  Weight Management Weight maintenance  [Encouraged]  Education Interventions   Education Provided Provided Education  Provided Verbal Education On Nutrition, Mental Health/Coping with Illness, When to see the doctor, Foot Care, Eye Care, Labs, Blood Sugar Monitoring, Applications, Exercise, Medication, Development worker, community, [employer]  [Encouraged]  Labs Reviewed Hgb A1c  Mental Health Interventions   Mental Health Discussed/Reviewed Mental Health Discussed, Depression, Anxiety, Grief and Loss, Mental Health Reviewed, Coping Strategies, Substance Abuse, Suicide, Crisis, Other  [Confirmed Absence of Domestic Violence]  Nutrition Interventions   Nutrition Discussed/Reviewed Nutrition Discussed, Nutrition Reviewed, Carbohydrate meal planning, Supplemental nutrition, Decreasing sugar intake, Portion sizes, Decreasing salt, Fluid intake, Decreasing fats, Increasing proteins, Adding fruits and vegetables  [Encouraged]  Pharmacy Interventions   Pharmacy Dicussed/Reviewed Pharmacy Topics Discussed, Medications and their functions, Medication Adherence, Pharmacy Topics Reviewed, Affording Medications  [Encouraged]  Medication Adherence --  [Encouraged to Check Blood Pressure & Take Prescription Medications Prior to Dialysis Treatments]  Safety Interventions   Safety Discussed/Reviewed Safety Discussed, Safety Reviewed, Fall Risk, Home Safety  [Encouraged]  Home Safety Assistive Devices, Refer for community resources  Advanced Directive Interventions   Advanced Directives Discussed/Reviewed Advanced Directives Discussed  [Encouraged Completion]      Assessed Social Determinant of Health Barriers. Discussed Plans for Ongoing Care Management Follow Up. Provided Careers information officer Information for Care Management Team Members. Screened for Signs & Symptoms of Depression, Related to Chronic Disease  State.  PHQ2 & PHQ9 Depression Screen Completed & Results Reviewed.  Suicidal Ideation & Homicidal Ideation Assessed - None Present.   Domestic Violence Assessed - None Present. Access to Weapons Assessed - None Present.   Active Listening & Reflection Utilized.  Verbalization of Feelings Encouraged.  Emotional Support Provided. Feelings of Caregiver Burnout, Stress, Frustration &  Fatigue Validated. Symptoms of Anxiety & Depression Acknowledged. Caregiver Resources Reviewed. Caregiver Support Groups Provided. Self-Enrollment in Caregiver Support Group of Interest Emphasized, from List Provided. Crisis Support Information, Agencies, Services & Resources Discussed. Problem Solving Interventions Identified. Task-Centered Solutions Implemented.   Solution-Focused Strategies Developed. Acceptance & Commitment Therapy Introduced. Brief Cognitive Behavioral Therapy Initiated. Client-Centered Therapy Enacted. Reviewed Prescription Medications & Discussed Importance of Compliance. Encouraged Administration of Prescription Medications Prior to Dialysis Treatments. Encouraged to Continue to Actively Monitor Blood Pressure, Keeping a Daily Log of Readings. Quality of Sleep Assessed & Sleep Hygiene Techniques Promoted. Discussed Higher Level of Care Options (I.e. Assisted Living, Extended Care, Skilled Nursing, Etc.) & Confirmed Disinterest in Consideration. Verified No In-Home Care Services, [employer], Warden/ranger, Etc., Covered Under Firefighter through [employer].  Verified No Long-Term Care Insurance Benefits, Secondary Insurance Policies, Plans, Coverage, Etc.   Reviewed Materials engineer through [employer] & Encouraged Consideration of Completion of Medicaid Application. Confirmed Patient Was Not a Cytogeneticist, Making Her Ineligible to Apply for Aid & Attendance Benefits, Through [employer] in  [place]. Offered Assistance with Completion of Medicaid Application & Encouraged Submission to [facility] of Social Services [phone]), for Processing. Encouraged Self-Enrollment with Psychiatrist of Interest in [facility], from List Provided, to Receive Psychotropic Medication Administration & Management, in An Effort to Reduce & Manage Symptoms of Depression. Encouraged Self-Enrollment with Therapist of Interest in [facility], from List Provided, to Receive Psychotherapeutic Counseling & Supportive Services, in An Effort to Reduce & Manage Symptoms of Depression. Encouraged Contact with CSW (# [phone]), if You Have Questions, Need Assistance, or If Additional Social Work Needs are Identified Between Now & Our Next Follow-Up Outreach Call, Scheduled on 03/17/2023 at 4:00 PM.      Our next appointment is by telephone on 03/17/2023 at 4:00 pm.  Please call the care guide team at [phone] if you need to cancel or reschedule your appointment.   If you are experiencing a Mental Health or Behavioral Health Crisis or need someone to talk to, please call the Suicide and Crisis Lifeline: 988 call the [place] National Suicide Prevention Lifeline: (509)490-8335 or TTY: 509-162-8516 TTY 5390078488) to talk to a trained counselor call 1-800-273-TALK (toll free, 24 hour hotline) go to [facility] [address] [place] [phone]) call the [facility] Crisis Line: [phone] call 911  Patient verbalizes understanding of instructions and care plan provided today and agrees to view in MyChart. Active MyChart status and patient understanding of how to access instructions and care plan via MyChart confirmed with patient.     Telephone follow up appointment with care management team member scheduled for:  03/17/2023 at 4:00 pm.  Danford Bad, BSW, MSW, LCSW  [employer] Social Work Case  Manager  [facility], Population Health Direct Dial: [phone]  Fax: [phone] Email: [email or web address]: [email or web address]

## 2023-03-07 NOTE — Patient Outreach (Signed)
Care Coordination   Initial Visit Note   03/07/2023  Name: Sheri Cooper MRN: [id number] DOB: 27-Jan-1951  Sheri Cooper is a 72 y.o. year old female who sees Richardean Chimera, MD for primary care. I spoke with Sheri Cooper by phone today.  What matters to the patients health and wellness today?  Assess Need for Social Work Involvement.    Goals Addressed               This Visit's Progress     Assess Need for Social Work Involvement. (pt-stated)   On track     Care Coordination Interventions:  Interventions Today    Flowsheet Row Most Recent Value  Chronic Disease   Chronic disease during today's visit Hypertension (HTN), Chronic Obstructive Pulmonary Disease (COPD), Other, Chronic Kidney Disease/End Stage Renal Disease (ESRD)  [Acute Respiratory Failure with Hypoxia, Hypoalbuminermia Due to Protein-Calorie Malnutrition]  General Interventions   General Interventions Discussed/Reviewed General Interventions Discussed, Labs, Vaccines, Doctor Visits, Health Screening, Annual Foot Exam, General Interventions Reviewed, Lipid Profile, Annual Eye Exam, Durable Medical Equipment (DME), Community Resources, Level of Care, Communication with  [Communication with Care Team Members]  Labs Hgb A1c every 3 months, Kidney Function  [Encouraged]  Vaccines COVID-19, Flu, Pneumonia, RSV, Shingles, Tetanus/Pertussis/Diphtheria  [Encouraged]  Doctor Visits Discussed/Reviewed Doctor Visits Discussed, Specialist, Doctor Visits Reviewed, Annual Wellness Visits, PCP  [Encouraged]  Health Screening Bone Density, Colonoscopy, Mammogram  [Encouraged]  Durable Medical Equipment (DME) BP Cuff, Walker, Wheelchair, Other  [Prescription Glasses, Scales, Cane]  Wheelchair Standard  PCP/Specialist Visits Compliance with follow-up visit  [Encouraged]  Communication with PCP/Specialists, Charity fundraiser, Pharmacists, Social Work  [Encouraged]  Exercise Interventions   Exercise Discussed/Reviewed Exercise  Discussed, Education administrator use and maintanence, Exercise Reviewed, Physical Activity, Weight Managment  [Encouraged]  Physical Activity Discussed/Reviewed Physical Activity Discussed, Home Exercise Program (HEP), PREP, Physical Activity Reviewed, Gym, Types of exercise  [Encouraged]  Weight Management Weight maintenance  [Encouraged]  Education Interventions   Education Provided Provided Education  Provided Verbal Education On Nutrition, Mental Health/Coping with Illness, When to see the doctor, Foot Care, Eye Care, Labs, Blood Sugar Monitoring, Applications, Exercise, Medication, Development worker, community, [employer]  [Encouraged]  Labs Reviewed Hgb A1c  Mental Health Interventions   Mental Health Discussed/Reviewed Mental Health Discussed, Depression, Anxiety, Grief and Loss, Mental Health Reviewed, Coping Strategies, Substance Abuse, Suicide, Crisis, Other  [Confirmed Absence of Domestic Violence]  Nutrition Interventions   Nutrition Discussed/Reviewed Nutrition Discussed, Nutrition Reviewed, Carbohydrate meal planning, Supplemental nutrition, Decreasing sugar intake, Portion sizes, Decreasing salt, Fluid intake, Decreasing fats, Increasing proteins, Adding fruits and vegetables  [Encouraged]  Pharmacy Interventions   Pharmacy Dicussed/Reviewed Pharmacy Topics Discussed, Medications and their functions, Medication Adherence, Pharmacy Topics Reviewed, Affording Medications  [Encouraged]  Medication Adherence --  [Encouraged to Check Blood Pressure & Take Prescription Medications Prior to Dialysis Treatments]  Safety Interventions   Safety Discussed/Reviewed Safety Discussed, Safety Reviewed, Fall Risk, Home Safety  [Encouraged]  Home Safety Assistive Devices, Refer for community resources  Advanced Directive Interventions   Advanced Directives Discussed/Reviewed Advanced Directives Discussed  [Encouraged Completion]      Assessed Social Determinant of Health Barriers. Discussed Plans for  Ongoing Care Management Follow Up. Provided Careers information officer Information for Care Management Team Members. Screened for Signs & Symptoms of Depression, Related to Chronic Disease State.  PHQ2 & PHQ9 Depression Screen Completed & Results Reviewed.  Suicidal Ideation & Homicidal Ideation Assessed - None Present.   Domestic Violence Assessed - None  Present. Access to Weapons Assessed - None Present.   Active Listening & Reflection Utilized.  Verbalization of Feelings Encouraged.  Emotional Support Provided. Feelings of Caregiver Burnout, Stress, Frustration & Fatigue Validated. Symptoms of Anxiety & Depression Acknowledged. Caregiver Resources Reviewed. Caregiver Support Groups Provided. Self-Enrollment in Caregiver Support Group of Interest Emphasized, from List Provided. Crisis Support Information, Agencies, Services & Resources Discussed. Problem Solving Interventions Identified. Task-Centered Solutions Implemented.   Solution-Focused Strategies Developed. Acceptance & Commitment Therapy Introduced. Brief Cognitive Behavioral Therapy Initiated. Client-Centered Therapy Enacted. Reviewed Prescription Medications & Discussed Importance of Compliance. Encouraged Administration of Prescription Medications Prior to Dialysis Treatments. Encouraged to Continue to Actively Monitor Blood Pressure, Keeping a Daily Log of Readings. Quality of Sleep Assessed & Sleep Hygiene Techniques Promoted. Discussed Higher Level of Care Options (I.e. Assisted Living, Extended Care, Skilled Nursing, Etc.) & Confirmed Disinterest in Consideration. Verified No In-Home Care Services, [employer], Warden/ranger, Etc., Covered Under Firefighter through [employer].  Verified No Long-Term Care Insurance Benefits, Secondary Insurance Policies, Plans, Coverage, Etc.   Reviewed Materials engineer through [employer] & Encouraged Consideration of  Completion of Medicaid Application. Confirmed Patient Was Not a Cytogeneticist, Making Her Ineligible to Apply for Aid & Attendance Benefits, Through [employer] in [place]. Offered Assistance with Completion of Medicaid Application & Encouraged Submission to [facility] of Social Services [phone]), for Processing. Encouraged Self-Enrollment with Psychiatrist of Interest in [facility], from List Provided, to Receive Psychotropic Medication Administration & Management, in An Effort to Reduce & Manage Symptoms of Depression. Encouraged Self-Enrollment with Therapist of Interest in [facility], from List Provided, to Receive Psychotherapeutic Counseling & Supportive Services, in An Effort to Reduce & Manage Symptoms of Depression. Encouraged Contact with CSW (# [phone]), if You Have Questions, Need Assistance, or If Additional Social Work Needs are Identified Between Now & Our Next Follow-Up Outreach Call, Scheduled on 03/17/2023 at 4:00 PM.        SDOH assessments and interventions completed:  Yes.  SDOH Interventions Today    Flowsheet Row Most Recent Value  SDOH Interventions   Food Insecurity Interventions Intervention Not Indicated, Other (Comment)  [Provided List of Food [employer], Food Pantries & Soup Kitchens]  Housing Interventions Intervention Not Indicated  Transportation Interventions Intervention Not Indicated, Patient Resources (Friends/Family), Payor Benefit  Utilities Interventions Intervention Not Indicated  Alcohol Usage Interventions Intervention Not Indicated (Score <7)  Financial Strain Interventions Intervention Not Indicated  Physical Activity Interventions Patient Declined  Stress Interventions Intervention Not Indicated, Patient Declined  Social Connections Interventions Intervention Not Indicated  Health Literacy Interventions Intervention Not Indicated     Care Coordination Interventions:  Yes, provided.    Follow up plan: Follow up call scheduled for 03/17/2023 at 4:00 pm.  Encounter Outcome:  Patient Visit Completed.   Danford Bad, BSW, MSW, Printmaker Social Work Case Set designer Health  [facility], Population Health Direct Dial: [phone]  Fax: [phone] Email: [email or web address]: [email or web address]

## 2023-03-07 NOTE — Progress Notes (Signed)
  Care Coordination Note  03/07/2023 Name: Sheri Cooper MRN: [id number] DOB: 07-07-1950  Sheri Cooper is a 72 y.o. year old female who is a primary care patient of Richardean Chimera, MD and is actively engaged with the care management team. I reached out to Thomasenia Sales by phone today to assist with scheduling a follow up visit with the RN Case Manager  Follow up plan: Telephone appointment with care management team member scheduled for:11/6  [facility] Coordination Care Guide  Direct Dial: [phone]

## 2023-03-08 DIAGNOSIS — Z992 Dependence on renal dialysis: Secondary | ICD-10-CM | POA: Diagnosis not present

## 2023-03-08 DIAGNOSIS — N186 End stage renal disease: Secondary | ICD-10-CM | POA: Diagnosis not present

## 2023-03-09 ENCOUNTER — Ambulatory Visit: Payer: Self-pay | Admitting: *Deleted

## 2023-03-09 ENCOUNTER — Encounter: Payer: Self-pay | Admitting: *Deleted

## 2023-03-09 NOTE — Patient Outreach (Signed)
  Care Coordination   03/09/2023 Name: Sheri Cooper MRN: [id number] DOB: 05-30-1950   Care Coordination Outreach Attempts:  An unsuccessful telephone outreach was attempted for a scheduled appointment today.  Follow Up Plan:  Additional outreach attempts will be made to offer the patient care coordination information and services.   Encounter Outcome:  No Answer. Left HIPAA compliant VM.   Care Coordination Interventions:  Yes, provided. Staff message sent to care guide requesting outreach and rescheduling. Patient's PCP office, [facility], has advised that they are providing their own care management services. I will forward this note to their office as an FYI and update.  Interventions Today    Flowsheet Row Most Recent Value  Chronic Disease   Chronic disease during today's visit Chronic Kidney Disease/End Stage Renal Disease (ESRD)  General Interventions   General Interventions Discussed/Reviewed Communication with, General Interventions Reviewed  Doctor Visits Discussed/Reviewed Doctor Visits Reviewed  Communication with --  [Consulted by Baruch Gouty, Population Health Renal Coordinator after their grand round meeting with [facility] dialysis regarding patient's uncontrolled blood pressure when presenting for dialysis and need to take medication prior to treatment.]  Pharmacy Interventions   Pharmacy Dicussed/Reviewed Pharmacy Topics Reviewed       Demetrios Loll, RN, BSN Care Management Coordinator [facility]  [facility] Direct Dial: [phone] Main #: [phone]

## 2023-03-10 DIAGNOSIS — N186 End stage renal disease: Secondary | ICD-10-CM | POA: Diagnosis not present

## 2023-03-10 DIAGNOSIS — Z992 Dependence on renal dialysis: Secondary | ICD-10-CM | POA: Diagnosis not present

## 2023-03-12 DIAGNOSIS — N186 End stage renal disease: Secondary | ICD-10-CM | POA: Diagnosis not present

## 2023-03-12 DIAGNOSIS — Z992 Dependence on renal dialysis: Secondary | ICD-10-CM | POA: Diagnosis not present

## 2023-03-14 DIAGNOSIS — J449 Chronic obstructive pulmonary disease, unspecified: Secondary | ICD-10-CM | POA: Diagnosis not present

## 2023-03-14 DIAGNOSIS — S0990XA Unspecified injury of head, initial encounter: Secondary | ICD-10-CM | POA: Diagnosis not present

## 2023-03-14 DIAGNOSIS — R519 Headache, unspecified: Secondary | ICD-10-CM | POA: Diagnosis not present

## 2023-03-14 DIAGNOSIS — R229 Localized swelling, mass and lump, unspecified: Secondary | ICD-10-CM | POA: Diagnosis not present

## 2023-03-14 DIAGNOSIS — W07XXXA Fall from chair, initial encounter: Secondary | ICD-10-CM | POA: Diagnosis not present

## 2023-03-14 DIAGNOSIS — I10 Essential (primary) hypertension: Secondary | ICD-10-CM | POA: Diagnosis not present

## 2023-03-14 DIAGNOSIS — M069 Rheumatoid arthritis, unspecified: Secondary | ICD-10-CM | POA: Diagnosis not present

## 2023-03-14 DIAGNOSIS — R42 Dizziness and giddiness: Secondary | ICD-10-CM | POA: Diagnosis not present

## 2023-03-14 DIAGNOSIS — Z87891 Personal history of nicotine dependence: Secondary | ICD-10-CM | POA: Diagnosis not present

## 2023-03-14 DIAGNOSIS — E782 Mixed hyperlipidemia: Secondary | ICD-10-CM | POA: Diagnosis not present

## 2023-03-14 DIAGNOSIS — Z79899 Other long term (current) drug therapy: Secondary | ICD-10-CM | POA: Diagnosis not present

## 2023-03-15 DIAGNOSIS — N186 End stage renal disease: Secondary | ICD-10-CM | POA: Diagnosis not present

## 2023-03-15 DIAGNOSIS — Z992 Dependence on renal dialysis: Secondary | ICD-10-CM | POA: Diagnosis not present

## 2023-03-17 ENCOUNTER — Encounter: Payer: Medicare Other | Admitting: *Deleted

## 2023-03-17 ENCOUNTER — Ambulatory Visit: Payer: Self-pay | Admitting: *Deleted

## 2023-03-17 DIAGNOSIS — Z992 Dependence on renal dialysis: Secondary | ICD-10-CM | POA: Diagnosis not present

## 2023-03-17 DIAGNOSIS — N186 End stage renal disease: Secondary | ICD-10-CM | POA: Diagnosis not present

## 2023-03-17 NOTE — Patient Instructions (Signed)
Visit Information  Thank you for taking time to visit with me today. Please don't hesitate to contact me if I can be of assistance to you.   Following are the goals we discussed today:   Goals Addressed               This Visit's Progress     COMPLETED: Assess Need for Social Work Involvement. (pt-stated)   On track     Care Coordination Interventions:  Interventions Today    Flowsheet Row Most Recent Value  Chronic Disease   Chronic disease during today's visit Hypertension (HTN), Chronic Obstructive Pulmonary Disease (COPD), Other, Chronic Kidney Disease/End Stage Renal Disease (ESRD)  [Acute Respiratory Failure with Hypoxia, Hypoalbuminermia Due to Protein-Calorie Malnutrition]  General Interventions   General Interventions Discussed/Reviewed General Interventions Discussed, Labs, Vaccines, Doctor Visits, Health Screening, Annual Foot Exam, General Interventions Reviewed, Lipid Profile, Annual Eye Exam, Durable Medical Equipment (DME), Community Resources, Level of Care, Communication with  [Communication with Care Team Members]  Labs Hgb A1c every 3 months, Kidney Function  [Encouraged]  Vaccines COVID-19, Flu, Pneumonia, RSV, Shingles, Tetanus/Pertussis/Diphtheria  [Encouraged]  Doctor Visits Discussed/Reviewed Doctor Visits Discussed, Specialist, Doctor Visits Reviewed, Annual Wellness Visits, PCP  [Encouraged]  Health Screening Bone Density, Colonoscopy, Mammogram  [Encouraged]  Durable Medical Equipment (DME) BP Cuff, Walker, Wheelchair, Other  [Prescription Glasses, Scales, Cane]  Wheelchair Standard  PCP/Specialist Visits Compliance with follow-up visit  [Encouraged]  Communication with PCP/Specialists, Charity fundraiser, Pharmacists, Social Work  [Encouraged]  Exercise Interventions   Exercise Discussed/Reviewed Exercise Discussed, Education administrator use and maintanence, Exercise Reviewed, Physical Activity, Weight Managment  [Encouraged]  Physical Activity Discussed/Reviewed Physical  Activity Discussed, Home Exercise Program (HEP), PREP, Physical Activity Reviewed, Gym, Types of exercise  [Encouraged]  Weight Management Weight maintenance  [Encouraged]  Education Interventions   Education Provided Provided Education  Provided Verbal Education On Nutrition, Mental Health/Coping with Illness, When to see the doctor, Foot Care, Eye Care, Labs, Blood Sugar Monitoring, Applications, Exercise, Medication, Development worker, community, [employer]  [Encouraged]  Labs Reviewed Hgb A1c  Mental Health Interventions   Mental Health Discussed/Reviewed Mental Health Discussed, Depression, Anxiety, Grief and Loss, Mental Health Reviewed, Coping Strategies, Substance Abuse, Suicide, Crisis, Other  [Confirmed Absence of Domestic Violence]  Nutrition Interventions   Nutrition Discussed/Reviewed Nutrition Discussed, Nutrition Reviewed, Carbohydrate meal planning, Supplemental nutrition, Decreasing sugar intake, Portion sizes, Decreasing salt, Fluid intake, Decreasing fats, Increasing proteins, Adding fruits and vegetables  [Encouraged]  Pharmacy Interventions   Pharmacy Dicussed/Reviewed Pharmacy Topics Discussed, Medications and their functions, Medication Adherence, Pharmacy Topics Reviewed, Affording Medications  [Encouraged]  Medication Adherence --  [Encouraged to Check Blood Pressure & Take Prescription Medications Prior to Dialysis Treatments]  Safety Interventions   Safety Discussed/Reviewed Safety Discussed, Safety Reviewed, Fall Risk, Home Safety  [Encouraged]  Home Safety Assistive Devices, Refer for community resources  Advanced Directive Interventions   Advanced Directives Discussed/Reviewed Advanced Directives Discussed  [Encouraged Completion]      Active Listening & Reflection Utilized.  Verbalization of Feelings Encouraged.  Emotional Support Provided. Problem Solving Interventions Indicated.   Solution-Focused Strategies Employed. Acceptance & Commitment Therapy  Initiated. Brief Cognitive Behavioral Therapy Implemented. Client-Centered Therapy Performed. Offered Assistance with Completion of Medicaid Application & Encouraged Submission to [facility] of Social Services [phone]), for Processing. Encouraged Self-Enrollment with Psychiatrist of Interest in [facility], from List Provided, to Receive Psychotropic Medication Administration & Management, in An Effort to Reduce & Manage Symptoms of Depression. Encouraged Self-Enrollment with Therapist  of Interest in [facility], from List Provided, to Receive Psychotherapeutic Counseling & Supportive Services, in An Effort to Reduce & Manage Symptoms of Depression. Encouraged Contact with CSW (# [phone]), if You Have Questions, Need Assistance, or If Additional Social Work Needs are Identified in [employer], as Haematologist at [facility] [phone]) Have Advised That They Will Be Providing Their Own Care Management Services Moving Forward.        Please call the care guide team at [phone] if you need to cancel or reschedule your appointment.   If you are experiencing a Mental Health or Behavioral Health Crisis or need someone to talk to, please call the Suicide and Crisis Lifeline: 988 call the [place] National Suicide Prevention Lifeline: (779)611-8785 or TTY: (334)083-6646 TTY (316) 020-2217) to talk to a trained counselor call 1-800-273-TALK (toll free, 24 hour hotline) go to [facility] [address], [place] [phone]) call the [facility] Crisis Line: [phone] call 911  Patient verbalizes understanding of instructions and care plan provided today and agrees to view in MyChart. Active MyChart status and patient understanding of how to access instructions and care plan via MyChart confirmed with patient.     No further follow up required.  Danford Bad, BSW, MSW, Optometrist Social Work Case Set designer Health  [facility], Population Health Direct Dial: [phone]  Fax: [phone] Email: [email or web address] Website: [email or web address]

## 2023-03-17 NOTE — Patient Outreach (Signed)
Care Coordination   Follow Up Visit Note   03/17/2023  Name: Sheri Cooper MRN: [id number] DOB: 1951-03-12  Sheri Cooper is a 72 y.o. year old female who sees Richardean Chimera, MD for primary care. I spoke with Sheri Cooper by phone today.  What matters to the patients health and wellness today?  Assess Need for Social Work Involvement.   Goals Addressed               This Visit's Progress     COMPLETED: Assess Need for Social Work Involvement. (pt-stated)   On track     Care Coordination Interventions:  Interventions Today    Flowsheet Row Most Recent Value  Chronic Disease   Chronic disease during today's visit Hypertension (HTN), Chronic Obstructive Pulmonary Disease (COPD), Other, Chronic Kidney Disease/End Stage Renal Disease (ESRD)  [Acute Respiratory Failure with Hypoxia, Hypoalbuminermia Due to Protein-Calorie Malnutrition]  General Interventions   General Interventions Discussed/Reviewed General Interventions Discussed, Labs, Vaccines, Doctor Visits, Health Screening, Annual Foot Exam, General Interventions Reviewed, Lipid Profile, Annual Eye Exam, Durable Medical Equipment (DME), Community Resources, Level of Care, Communication with  [Communication with Care Team Members]  Labs Hgb A1c every 3 months, Kidney Function  [Encouraged]  Vaccines COVID-19, Flu, Pneumonia, RSV, Shingles, Tetanus/Pertussis/Diphtheria  [Encouraged]  Doctor Visits Discussed/Reviewed Doctor Visits Discussed, Specialist, Doctor Visits Reviewed, Annual Wellness Visits, PCP  [Encouraged]  Health Screening Bone Density, Colonoscopy, Mammogram  [Encouraged]  Durable Medical Equipment (DME) BP Cuff, Walker, Wheelchair, Other  [Prescription Glasses, Scales, Cane]  Wheelchair Standard  PCP/Specialist Visits Compliance with follow-up visit  [Encouraged]  Communication with PCP/Specialists, Charity fundraiser, Pharmacists, Social Work  [Encouraged]  Exercise Interventions   Exercise  Discussed/Reviewed Exercise Discussed, Education administrator use and maintanence, Exercise Reviewed, Physical Activity, Weight Managment  [Encouraged]  Physical Activity Discussed/Reviewed Physical Activity Discussed, Home Exercise Program (HEP), PREP, Physical Activity Reviewed, Gym, Types of exercise  [Encouraged]  Weight Management Weight maintenance  [Encouraged]  Education Interventions   Education Provided Provided Education  Provided Verbal Education On Nutrition, Mental Health/Coping with Illness, When to see the doctor, Foot Care, Eye Care, Labs, Blood Sugar Monitoring, Applications, Exercise, Medication, Development worker, community, [employer]  [Encouraged]  Labs Reviewed Hgb A1c  Mental Health Interventions   Mental Health Discussed/Reviewed Mental Health Discussed, Depression, Anxiety, Grief and Loss, Mental Health Reviewed, Coping Strategies, Substance Abuse, Suicide, Crisis, Other  [Confirmed Absence of Domestic Violence]  Nutrition Interventions   Nutrition Discussed/Reviewed Nutrition Discussed, Nutrition Reviewed, Carbohydrate meal planning, Supplemental nutrition, Decreasing sugar intake, Portion sizes, Decreasing salt, Fluid intake, Decreasing fats, Increasing proteins, Adding fruits and vegetables  [Encouraged]  Pharmacy Interventions   Pharmacy Dicussed/Reviewed Pharmacy Topics Discussed, Medications and their functions, Medication Adherence, Pharmacy Topics Reviewed, Affording Medications  [Encouraged]  Medication Adherence --  [Encouraged to Check Blood Pressure & Take Prescription Medications Prior to Dialysis Treatments]  Safety Interventions   Safety Discussed/Reviewed Safety Discussed, Safety Reviewed, Fall Risk, Home Safety  [Encouraged]  Home Safety Assistive Devices, Refer for community resources  Advanced Directive Interventions   Advanced Directives Discussed/Reviewed Advanced Directives Discussed  [Encouraged Completion]      Active Listening & Reflection Utilized.   Verbalization of Feelings Encouraged.  Emotional Support Provided. Problem Solving Interventions Indicated.   Solution-Focused Strategies Employed. Acceptance & Commitment Therapy Initiated. Brief Cognitive Behavioral Therapy Implemented. Client-Centered Therapy Performed. Offered Assistance with Completion of Medicaid Application & Encouraged Submission to [facility] of Social Services [phone]), for Processing. Encouraged Self-Enrollment with  Psychiatrist of Interest in [facility], from List Provided, to Receive Psychotropic Medication Administration & Management, in An Effort to Reduce & Manage Symptoms of Depression. Encouraged Self-Enrollment with Therapist of Interest in [facility], from List Provided, to Receive Psychotherapeutic Counseling & Supportive Services, in An Effort to Reduce & Manage Symptoms of Depression. Encouraged Contact with CSW (# [phone]), if You Have Questions, Need Assistance, or If Additional Social Work Needs are Identified in [employer], as Haematologist at [facility] [phone]) Have Advised That They Will Be Providing Their Own Care Management Services Moving Forward.        SDOH assessments and interventions completed:  Yes.  Care Coordination Interventions:  Yes, provided.   Follow up plan: No further intervention required.   Encounter Outcome:  Patient Visit Completed.   Danford Bad, BSW, MSW, Printmaker Social Work Case Set designer Health  [facility], Population Health Direct Dial: [phone]  Fax: [phone] Email: [email or web address]: [email or web address]

## 2023-03-19 DIAGNOSIS — N186 End stage renal disease: Secondary | ICD-10-CM | POA: Diagnosis not present

## 2023-03-19 DIAGNOSIS — Z992 Dependence on renal dialysis: Secondary | ICD-10-CM | POA: Diagnosis not present

## 2023-03-22 ENCOUNTER — Telehealth: Payer: Self-pay | Admitting: *Deleted

## 2023-03-22 DIAGNOSIS — N186 End stage renal disease: Secondary | ICD-10-CM | POA: Diagnosis not present

## 2023-03-22 DIAGNOSIS — Z992 Dependence on renal dialysis: Secondary | ICD-10-CM | POA: Diagnosis not present

## 2023-03-22 NOTE — Progress Notes (Signed)
  Care Coordination Note  03/22/2023 Name: [name] MRN: [id number] DOB: 06-Mar-1951  Sheri Cooper is a 72 y.o. year old female who is a primary care patient of Richardean Chimera, MD and is actively engaged with the care management team. I reached out to Thomasenia Sales by phone today to assist with re-scheduling a follow up visit with the RN Case Manager  Follow up plan: Unsuccessful telephone outreach attempt made. A HIPAA compliant phone message was left for the patient providing contact information and requesting a return call.   [facility]  Care Coordination Care Guide  Direct Dial: [phone]

## 2023-03-24 DIAGNOSIS — N186 End stage renal disease: Secondary | ICD-10-CM | POA: Diagnosis not present

## 2023-03-24 DIAGNOSIS — Z992 Dependence on renal dialysis: Secondary | ICD-10-CM | POA: Diagnosis not present

## 2023-03-26 DIAGNOSIS — N186 End stage renal disease: Secondary | ICD-10-CM | POA: Diagnosis not present

## 2023-03-26 DIAGNOSIS — Z992 Dependence on renal dialysis: Secondary | ICD-10-CM | POA: Diagnosis not present

## 2023-03-29 DIAGNOSIS — Z992 Dependence on renal dialysis: Secondary | ICD-10-CM | POA: Diagnosis not present

## 2023-03-29 DIAGNOSIS — N186 End stage renal disease: Secondary | ICD-10-CM | POA: Diagnosis not present

## 2023-03-31 DIAGNOSIS — N186 End stage renal disease: Secondary | ICD-10-CM | POA: Diagnosis not present

## 2023-03-31 DIAGNOSIS — Z992 Dependence on renal dialysis: Secondary | ICD-10-CM | POA: Diagnosis not present

## 2023-04-02 DIAGNOSIS — Z992 Dependence on renal dialysis: Secondary | ICD-10-CM | POA: Diagnosis not present

## 2023-04-02 DIAGNOSIS — N186 End stage renal disease: Secondary | ICD-10-CM | POA: Diagnosis not present

## 2023-04-04 ENCOUNTER — Encounter: Payer: Self-pay | Admitting: *Deleted

## 2023-04-04 ENCOUNTER — Ambulatory Visit: Payer: Self-pay | Admitting: *Deleted

## 2023-04-04 NOTE — Progress Notes (Signed)
  Care Coordination Note  04/04/2023 Name: Sheri Cooper MRN: [id number] DOB: [birth year]  [name] is a 72 y.o. year old female who is a primary care patient of Richardean Chimera, MD and is actively engaged with the care management team. I reached out to Thomasenia Sales by phone today to assist with re-scheduling a follow up visit with the RN Case Manager  Follow up plan: Telephone appointment with care management team member scheduled for:04/04/23  [facility] Coordination Care Guide  Direct Dial: [phone]

## 2023-04-05 DIAGNOSIS — Z992 Dependence on renal dialysis: Secondary | ICD-10-CM | POA: Diagnosis not present

## 2023-04-05 DIAGNOSIS — N186 End stage renal disease: Secondary | ICD-10-CM | POA: Diagnosis not present

## 2023-04-05 NOTE — Patient Outreach (Signed)
Care Coordination   Follow Up Visit Note   04/04/2023 Name: Sheri Cooper MRN: [id number] DOB: 18-Jun-1950  Sheri Cooper is a 72 y.o. year old female who sees Richardean Chimera, MD for primary care. I spoke with  Sheri Cooper by phone today.  What matters to the patients health and wellness today?  Patient did not endorse any specific concerns. Patient was outreached for follow-up after discussion with Population Health Renal Coordinator.   Goals Addressed             This Visit's Progress    Care Coordination Needs       Care Coordination Goals: Patient will continue to use [facility] for transportation to [facility] on Tuesday, Thursday, and Saturday Patient will limit fluid intake to 32 oz per day (including liquids in foods) Patient will keep all medical appointments Patient will take medications before dialysis as directed Patient will reach out to RN Care Manager [phone] with any resource or care management needs     COMPLETED: Secure Transportation to Dialysis   On track    Care Coordination Goals: Patient will complete application for [employer] [place]ucker for dialysis transportation [facility] in [place], Th, & Sat at 10:30 Patient will talk with Social Worker at [facility] while there today regarding what the can offer to assist her with transportation [facility] awaiting return call from SW at [place] on Monday 4/8 regarding transition form CKD to ESRD and additional resources available Awaiting return call regarding transportation arrangement through [employer] [facility]. Environmental health practitioner relayed that transportation arrangement had been documented. Unsure if it is self pay. If so, that is still a barrier.  Patient will talk with family/friends to see if she can secure transportation for this Sat to dialysis  Patient will reach out to RN Care Coordinator [phone] with any resource or care coordination needs Patient will talk with Danford Bad, LCSW  [phone]) to see if there is anything she can assist with        SDOH assessments and interventions completed:  Yes SDOH Interventions Today    Flowsheet Row Most Recent Value  SDOH Interventions   Housing Interventions Intervention Not Indicated  Transportation Interventions Payor Benefit, Patient Resources (Friends/Family)  [Uses [employer] for transportation to dialysis]       Care Coordination Interventions:  Yes, provided  Interventions Today    Flowsheet Row Most Recent Value  Chronic Disease   Chronic disease during today's visit Chronic Kidney Disease/End Stage Renal Disease (ESRD)  General Interventions   General Interventions Discussed/Reviewed General Interventions Discussed, General Interventions Reviewed, Doctor Visits, Communication with  Doctor Visits Discussed/Reviewed Doctor Visits Discussed, Doctor Visits Reviewed, PCP, Specialist  PCP/Specialist Visits Compliance with follow-up visit  [Follow-up with PCP as directed. Dialysis on Tuesday, Thursday, and Saturday at [facility].]  Communication with --  [facility] via secure email by [facility] Renal Coordinator, Baruch Gouty after the dialysis [facility] on 03/03/23. Needs to maintain appropriate fluid intake & take medicaiton prior to dialysis treatment.]  Exercise Interventions   Exercise Discussed/Reviewed Exercise Discussed, Exercise Reviewed, Physical Activity  Physical Activity Discussed/Reviewed Physical Activity Discussed, Physical Activity Reviewed  Education Interventions   Education Provided Provided Education  Provided Verbal Education On Nutrition, When to see the doctor, Labs, Medication  Labs Reviewed Kidney Function  [labs are drawn at [facility] regularly. Reported via secure email on 03/07/23- ALB: 3.5  CA: 7.9  HGB: 10.6  PTH: 324  K: 4.2  PHOS: 9.6  KT/V: 2.09  TDW: 42.0 kg]  Nutrition Interventions   Nutrition Discussed/Reviewed Nutrition Discussed, Nutrition Reviewed,  Portion sizes, Fluid intake, Adding fruits and vegetables  [Limit fluids, including food with liquids, to 32 oz per day to avoid fluid overload]  Pharmacy Interventions   Pharmacy Dicussed/Reviewed Pharmacy Topics Discussed, Pharmacy Topics Reviewed, Medications and their functions, Medication Adherence  Medication Adherence --  [Patient reports taking medication regularly. Reinforced need to take medication prior to dialysis as advised]  Safety Interventions   Safety Discussed/Reviewed Safety Discussed, Safety Reviewed       Follow up plan: Follow up call scheduled for 05/06/23    Encounter Outcome:  Patient Visit Completed   Demetrios Loll, RN, BSN Care Management Coordinator [facility]  [facility] Direct Dial: [phone] Main #: [phone]

## 2023-04-07 DIAGNOSIS — N186 End stage renal disease: Secondary | ICD-10-CM | POA: Diagnosis not present

## 2023-04-07 DIAGNOSIS — Z992 Dependence on renal dialysis: Secondary | ICD-10-CM | POA: Diagnosis not present

## 2023-04-09 DIAGNOSIS — N186 End stage renal disease: Secondary | ICD-10-CM | POA: Diagnosis not present

## 2023-04-09 DIAGNOSIS — Z992 Dependence on renal dialysis: Secondary | ICD-10-CM | POA: Diagnosis not present

## 2023-04-12 DIAGNOSIS — Z992 Dependence on renal dialysis: Secondary | ICD-10-CM | POA: Diagnosis not present

## 2023-04-12 DIAGNOSIS — N186 End stage renal disease: Secondary | ICD-10-CM | POA: Diagnosis not present

## 2023-04-13 ENCOUNTER — Telehealth (HOSPITAL_COMMUNITY): Payer: Self-pay | Admitting: *Deleted

## 2023-04-13 NOTE — Telephone Encounter (Signed)
Received fax from Dr Cherylann Ratel requesting graftogram due to whistle in access. Will give to [facility] and upload fax to media.

## 2023-04-14 DIAGNOSIS — N186 End stage renal disease: Secondary | ICD-10-CM | POA: Diagnosis not present

## 2023-04-14 DIAGNOSIS — Z992 Dependence on renal dialysis: Secondary | ICD-10-CM | POA: Diagnosis not present

## 2023-04-16 DIAGNOSIS — Z992 Dependence on renal dialysis: Secondary | ICD-10-CM | POA: Diagnosis not present

## 2023-04-16 DIAGNOSIS — N186 End stage renal disease: Secondary | ICD-10-CM | POA: Diagnosis not present

## 2023-04-18 ENCOUNTER — Other Ambulatory Visit: Payer: Self-pay

## 2023-04-18 DIAGNOSIS — T829XXA Unspecified complication of cardiac and vascular prosthetic device, implant and graft, initial encounter: Secondary | ICD-10-CM

## 2023-04-18 DIAGNOSIS — N186 End stage renal disease: Secondary | ICD-10-CM

## 2023-04-18 MED ORDER — SODIUM CHLORIDE 0.9 % IV SOLN: 250.0000 mL | INTRAVENOUS | Status: AC | PRN

## 2023-04-18 MED ORDER — SODIUM CHLORIDE 0.9% FLUSH: 3.0000 mL | INTRAVENOUS | Status: DC | PRN

## 2023-04-19 DIAGNOSIS — Z992 Dependence on renal dialysis: Secondary | ICD-10-CM | POA: Diagnosis not present

## 2023-04-19 DIAGNOSIS — N186 End stage renal disease: Secondary | ICD-10-CM | POA: Diagnosis not present

## 2023-04-21 DIAGNOSIS — Z992 Dependence on renal dialysis: Secondary | ICD-10-CM | POA: Diagnosis not present

## 2023-04-21 DIAGNOSIS — N186 End stage renal disease: Secondary | ICD-10-CM | POA: Diagnosis not present

## 2023-04-23 DIAGNOSIS — N186 End stage renal disease: Secondary | ICD-10-CM | POA: Diagnosis not present

## 2023-04-23 DIAGNOSIS — Z992 Dependence on renal dialysis: Secondary | ICD-10-CM | POA: Diagnosis not present

## 2023-04-25 DIAGNOSIS — Z992 Dependence on renal dialysis: Secondary | ICD-10-CM | POA: Diagnosis not present

## 2023-04-25 DIAGNOSIS — N186 End stage renal disease: Secondary | ICD-10-CM | POA: Diagnosis not present

## 2023-04-28 DIAGNOSIS — Z992 Dependence on renal dialysis: Secondary | ICD-10-CM | POA: Diagnosis not present

## 2023-04-28 DIAGNOSIS — N186 End stage renal disease: Secondary | ICD-10-CM | POA: Diagnosis not present

## 2023-04-30 DIAGNOSIS — N186 End stage renal disease: Secondary | ICD-10-CM | POA: Diagnosis not present

## 2023-04-30 DIAGNOSIS — Z992 Dependence on renal dialysis: Secondary | ICD-10-CM | POA: Diagnosis not present

## 2023-05-02 ENCOUNTER — Other Ambulatory Visit: Payer: Self-pay

## 2023-05-02 ENCOUNTER — Ambulatory Visit (HOSPITAL_COMMUNITY)
Admission: RE | Admit: 2023-05-02 | Discharge: 2023-05-02 | Disposition: A | Discharge status: Home / Self Care | Payer: Medicare Other | Attending: Vascular Surgery | Admitting: Vascular Surgery

## 2023-05-02 ENCOUNTER — Encounter (HOSPITAL_COMMUNITY)
Admission: RE | Disposition: A | Discharge status: Home / Self Care | Payer: Self-pay | Source: Home / Self Care | Attending: Vascular Surgery

## 2023-05-02 DIAGNOSIS — Y832 Surgical operation with anastomosis, bypass or graft as the cause of abnormal reaction of the patient, or of later complication, without mention of misadventure at the time of the procedure: Secondary | ICD-10-CM | POA: Insufficient documentation

## 2023-05-02 DIAGNOSIS — T829XXA Unspecified complication of cardiac and vascular prosthetic device, implant and graft, initial encounter: Secondary | ICD-10-CM

## 2023-05-02 DIAGNOSIS — T82858A Stenosis of vascular prosthetic devices, implants and grafts, initial encounter: Secondary | ICD-10-CM | POA: Insufficient documentation

## 2023-05-02 DIAGNOSIS — T82898A Other specified complication of vascular prosthetic devices, implants and grafts, initial encounter: Secondary | ICD-10-CM | POA: Diagnosis not present

## 2023-05-02 DIAGNOSIS — N186 End stage renal disease: Secondary | ICD-10-CM | POA: Diagnosis not present

## 2023-05-02 DIAGNOSIS — Z992 Dependence on renal dialysis: Secondary | ICD-10-CM | POA: Diagnosis not present

## 2023-05-02 HISTORY — PX: PERIPHERAL VASCULAR BALLOON ANGIOPLASTY: CATH118281

## 2023-05-02 HISTORY — PX: A/V SHUNTOGRAM: CATH118297

## 2023-05-02 LAB — POCT I-STAT, CHEM 8
BUN: 51 mg/dL — ABNORMAL HIGH (ref 8–23)
Calcium, Ion: 1.16 mmol/L (ref 1.15–1.40)
Chloride: 105 mmol/L (ref 98–111)
Creatinine, Ser: 7.3 mg/dL — ABNORMAL HIGH (ref 0.44–1.00)
Glucose, Bld: 85 mg/dL (ref 70–99)
HCT: 36 % (ref 36.0–46.0)
Hemoglobin: 12.2 g/dL (ref 12.0–15.0)
Potassium: 4.2 mmol/L (ref 3.5–5.1)
Sodium: 141 mmol/L (ref 135–145)
TCO2: 23 mmol/L (ref 22–32)

## 2023-05-02 SURGERY — A/V SHUNTOGRAM
Anesthesia: LOCAL

## 2023-05-02 MED ORDER — SODIUM CHLORIDE 0.9% FLUSH: 3.0000 mL | Freq: Two times a day (BID) | INTRAVENOUS | Status: DC

## 2023-05-02 MED ORDER — MIDAZOLAM HCL 2 MG/2ML IJ SOLN
INTRAMUSCULAR | Status: AC
  Filled 2023-05-02: qty 2

## 2023-05-02 MED ORDER — LIDOCAINE HCL (PF) 1 % IJ SOLN
INTRAMUSCULAR | Status: DC | PRN
  Administered 2023-05-02: 2 mL via INTRADERMAL

## 2023-05-02 MED ORDER — IODIXANOL 320 MG/ML IV SOLN
INTRAVENOUS | Status: DC | PRN
  Administered 2023-05-02: 35 mL

## 2023-05-02 MED ORDER — FENTANYL CITRATE (PF) 100 MCG/2ML IJ SOLN
INTRAMUSCULAR | Status: AC
  Filled 2023-05-02: qty 2

## 2023-05-02 MED ORDER — HEPARIN (PORCINE) IN NACL 1000-0.9 UT/500ML-% IV SOLN
INTRAVENOUS | Status: DC | PRN
  Administered 2023-05-02: 500 mL

## 2023-05-02 MED ORDER — LIDOCAINE HCL (PF) 1 % IJ SOLN
INTRAMUSCULAR | Status: AC
  Filled 2023-05-02: qty 30

## 2023-05-02 SURGICAL SUPPLY — 13 items
BALLN LUTONIX AV 8X60X75 (BALLOONS) ×2
BALLOON LUTONIX AV 8X60X75 (BALLOONS) IMPLANT
COVER DOME SNAP 22 D (MISCELLANEOUS) ×2 IMPLANT
KIT ENCORE 26 ADVANTAGE (KITS) IMPLANT
KIT MICROPUNCTURE NIT STIFF (SHEATH) IMPLANT
KIT SYRINGE INJ CVI SPIKEX1 (MISCELLANEOUS) IMPLANT
SET ATX-X65L (MISCELLANEOUS) IMPLANT
SHEATH PINNACLE R/O II 6F 4CM (SHEATH) IMPLANT
SHEATH PROBE COVER 6X72 (BAG) ×2 IMPLANT
STOPCOCK MORSE 400PSI 3WAY (MISCELLANEOUS) ×2 IMPLANT
TRAY PV CATH (CUSTOM PROCEDURE TRAY) ×2 IMPLANT
TUBING CIL FLEX 10 FLL-RA (TUBING) ×2 IMPLANT
WIRE STARTER BENTSON 035X150 (WIRE) IMPLANT

## 2023-05-02 NOTE — H&P (Addendum)
Patient seen and examined in preop holding.  No complaints. Reports the dialysis nurses and her nephrologist claim there is a bruit being heard.  She denies any issues with dialysis sessions or prolonged bleeding.  Palpable thrill in left upper arm AVG  After discussing the risks and benefits of AV fistulogram, Grace S Schoppe elected to proceed.   Daria Pastures MD

## 2023-05-02 NOTE — Op Note (Signed)
    Patient name: Sheri Cooper MRN: [id number] DOB: April 16, 1951 Sex: female  05/02/2023 Pre-operative Diagnosis: ESRD on HD Post-operative diagnosis:  Same Surgeon:  Daria Pastures, MD Procedure Performed:  Ultrasound-guided access of left upper arm AVG Central venogram and fistulogram Balloon angioplasty with 8 x 60 Lutonix DCB   Indications: Patient is a 72 year old female with a history of ESRD on HD and a functioning left upper extremity AVG.  Her nephrologist requested a fistulogram due to a bruit being heard on exam.  There have not been issues with dialysis at sessions or prolonged bleeding.  Risks and benefits of fistulogram with intervention were reviewed and she elected to proceed.  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  A time out was called.  Ultrasound was used to evaluate the left arm AV access. This was accessed under u/s guidance. An 018 wire was advanced without resistance, a micropuncture sheath was placed and fistulagram obtained which demonstrated the above findings.  This access was then upsized to a 77F short sheath over a glidewire.  The stenosis was crossed with a Bentson wire and this was treated with a 8 x 60 balloon tonics DCB.  Completion angiography demonstrated an adequate result with less than 20% residual stenosis.  The wire and balloon were removed, and a figure-of-eight suture with 4-0 Monocryl was placed and the sheath was removed.  Contrast: 35 cc Sedation: None  Impression: Adequate result of balloon angioplasty of 60% venous anastomotic stenosis.   Daria Pastures MD [facility] Office: [phone]

## 2023-05-03 ENCOUNTER — Encounter (HOSPITAL_COMMUNITY): Payer: Self-pay | Admitting: Vascular Surgery

## 2023-05-03 DIAGNOSIS — N186 End stage renal disease: Secondary | ICD-10-CM | POA: Diagnosis not present

## 2023-05-03 DIAGNOSIS — Z992 Dependence on renal dialysis: Secondary | ICD-10-CM | POA: Diagnosis not present

## 2023-05-05 DIAGNOSIS — N186 End stage renal disease: Secondary | ICD-10-CM | POA: Diagnosis not present

## 2023-05-05 DIAGNOSIS — Z992 Dependence on renal dialysis: Secondary | ICD-10-CM | POA: Diagnosis not present

## 2023-05-06 ENCOUNTER — Ambulatory Visit: Payer: Self-pay | Admitting: *Deleted

## 2023-05-06 ENCOUNTER — Encounter: Payer: Self-pay | Admitting: *Deleted

## 2023-05-06 NOTE — Patient Outreach (Signed)
  Care Coordination   Follow Up Visit Note   05/06/2023 Name: Sheri Cooper MRN: [id number] DOB: August 20, 1950  Sheri Cooper is a 73 y.o. year old female who sees Toribio Jerel MATSU, MD for primary care. I spoke with  Sheri Cooper by phone today.  What matters to the patients health and wellness today?  Patient did not endorse any specific questions or concerns today.    Goals Addressed             This Visit's Progress    COMPLETED: Care Coordination Needs       Care Coordination Goals: Patient will continue to use [facility] for transportation to [facility] on Tuesday, Thursday, and Saturday Patient will limit fluid intake to 32 oz per day (including liquids in foods) Patient will keep all medical appointments Patient will take medications before dialysis as directed Patient will reach out to RN Care Manager [phone] with any resource or care management needs         SDOH assessments and interventions completed:  Yes SDOH Interventions Today    Flowsheet Row Most Recent Value  SDOH Interventions   Transportation Interventions Payor Benefit, Patient Resources (Friends/Family)        Care Coordination Interventions:  Yes, provided  Interventions Today    Flowsheet Row Most Recent Value  Chronic Disease   Chronic disease during today's visit Chronic Kidney Disease/End Stage Renal Disease (ESRD), Hypertension (HTN)  General Interventions   General Interventions Discussed/Reviewed General Interventions Discussed, General Interventions Reviewed, Durable Medical Equipment (DME), Doctor Visits  Doctor Visits Discussed/Reviewed Doctor Visits Discussed, Doctor Visits Reviewed, Specialist  Durable Medical Equipment (DME) BP Cuff  [blood pressure is monitored 3 days a week at dialysis]  PCP/Specialist Visits Compliance with follow-up visit  [[facility] on Tuesday, Thursday, Saturday. Keep follow-up with PCP.]  Education Interventions   Education Provided  Provided Education  Provided Verbal Education On Other, Nutrition, When to see the doctor, Medication  [blood pressure monitoring]  Nutrition Interventions   Nutrition Discussed/Reviewed Nutrition Discussed, Nutrition Reviewed, Portion sizes, Decreasing salt, Fluid intake  [fluids are limited to 32 oz per day. Follow renal diet.]  Pharmacy Interventions   Pharmacy Dicussed/Reviewed Pharmacy Topics Discussed, Pharmacy Topics Reviewed, Medications and their functions  [patient is now taking medications prior to dialysis sessions as directed]  Safety Interventions   Safety Discussed/Reviewed Safety Discussed, Safety Reviewed       Follow up plan: Follow up call scheduled for 06/06/23    Encounter Outcome:  Patient Visit Completed   Josette Pellet, RN, BSN Care Manager [place]  [facility]  Population Health  Direct Dial : [phone] Main #: [phone]

## 2023-05-07 DIAGNOSIS — Z992 Dependence on renal dialysis: Secondary | ICD-10-CM | POA: Diagnosis not present

## 2023-05-07 DIAGNOSIS — N186 End stage renal disease: Secondary | ICD-10-CM | POA: Diagnosis not present

## 2023-05-10 DIAGNOSIS — Z992 Dependence on renal dialysis: Secondary | ICD-10-CM | POA: Diagnosis not present

## 2023-05-10 DIAGNOSIS — N186 End stage renal disease: Secondary | ICD-10-CM | POA: Diagnosis not present

## 2023-05-12 DIAGNOSIS — Z992 Dependence on renal dialysis: Secondary | ICD-10-CM | POA: Diagnosis not present

## 2023-05-12 DIAGNOSIS — N186 End stage renal disease: Secondary | ICD-10-CM | POA: Diagnosis not present

## 2023-05-16 DIAGNOSIS — Z992 Dependence on renal dialysis: Secondary | ICD-10-CM | POA: Diagnosis not present

## 2023-05-16 DIAGNOSIS — N186 End stage renal disease: Secondary | ICD-10-CM | POA: Diagnosis not present

## 2023-05-19 DIAGNOSIS — N186 End stage renal disease: Secondary | ICD-10-CM | POA: Diagnosis not present

## 2023-05-19 DIAGNOSIS — Z992 Dependence on renal dialysis: Secondary | ICD-10-CM | POA: Diagnosis not present

## 2023-05-21 DIAGNOSIS — N186 End stage renal disease: Secondary | ICD-10-CM | POA: Diagnosis not present

## 2023-05-21 DIAGNOSIS — Z992 Dependence on renal dialysis: Secondary | ICD-10-CM | POA: Diagnosis not present

## 2023-05-24 DIAGNOSIS — Z992 Dependence on renal dialysis: Secondary | ICD-10-CM | POA: Diagnosis not present

## 2023-05-24 DIAGNOSIS — N186 End stage renal disease: Secondary | ICD-10-CM | POA: Diagnosis not present

## 2023-05-26 DIAGNOSIS — N186 End stage renal disease: Secondary | ICD-10-CM | POA: Diagnosis not present

## 2023-05-26 DIAGNOSIS — Z992 Dependence on renal dialysis: Secondary | ICD-10-CM | POA: Diagnosis not present

## 2023-05-28 DIAGNOSIS — Z992 Dependence on renal dialysis: Secondary | ICD-10-CM | POA: Diagnosis not present

## 2023-05-28 DIAGNOSIS — N186 End stage renal disease: Secondary | ICD-10-CM | POA: Diagnosis not present

## 2023-05-31 DIAGNOSIS — N186 End stage renal disease: Secondary | ICD-10-CM | POA: Diagnosis not present

## 2023-05-31 DIAGNOSIS — Z992 Dependence on renal dialysis: Secondary | ICD-10-CM | POA: Diagnosis not present

## 2023-06-02 DIAGNOSIS — N186 End stage renal disease: Secondary | ICD-10-CM | POA: Diagnosis not present

## 2023-06-02 DIAGNOSIS — Z992 Dependence on renal dialysis: Secondary | ICD-10-CM | POA: Diagnosis not present

## 2023-06-03 DIAGNOSIS — Z992 Dependence on renal dialysis: Secondary | ICD-10-CM | POA: Diagnosis not present

## 2023-06-03 DIAGNOSIS — N186 End stage renal disease: Secondary | ICD-10-CM | POA: Diagnosis not present

## 2023-06-04 DIAGNOSIS — Z992 Dependence on renal dialysis: Secondary | ICD-10-CM | POA: Diagnosis not present

## 2023-06-04 DIAGNOSIS — N186 End stage renal disease: Secondary | ICD-10-CM | POA: Diagnosis not present

## 2023-06-06 ENCOUNTER — Encounter: Payer: Self-pay | Admitting: *Deleted

## 2023-06-06 ENCOUNTER — Ambulatory Visit: Payer: Self-pay | Admitting: *Deleted

## 2023-06-06 NOTE — Patient Outreach (Signed)
  Care Coordination   Follow Up Visit Note   06/06/2023 Name: Sheri Cooper MRN: [id number] DOB: Nov 07, 1950  Sheri Cooper is a 73 y.o. year old female who sees Richardean Chimera, MD for primary care. I spoke with  Sheri Cooper by phone today.  What matters to the patients health and wellness today?  Patient did not endorse any specific questions or concerns. Feels that her medical conditions are well managed at this time and does not have any resource needs.     SDOH assessments and interventions completed:  Yes  SDOH Interventions Today    Flowsheet Row Most Recent Value  SDOH Interventions   Housing Interventions Intervention Not Indicated  Transportation Interventions Payor Benefit, Patient Resources (Friends/Family)        Care Coordination Interventions:  Yes, provided  Interventions Today    Flowsheet Row Most Recent Value  Chronic Disease   Chronic disease during today's visit Chronic Kidney Disease/End Stage Renal Disease (ESRD)  General Interventions   General Interventions Discussed/Reviewed General Interventions Discussed, General Interventions Reviewed, Doctor Visits, Labs  Labs Kidney Function  Doctor Visits Discussed/Reviewed Doctor Visits Discussed, Doctor Visits Reviewed, Annual Wellness Visits, PCP, Specialist  PCP/Specialist Visits Compliance with follow-up visit  Exercise Interventions   Exercise Discussed/Reviewed Physical Activity  Physical Activity Discussed/Reviewed Physical Activity Discussed, Physical Activity Reviewed  Education Interventions   Education Provided Provided Education  Provided Verbal Education On When to see the doctor, Other, Medication, Exercise, Labs  [Encouraged to follow-up with [facility] with any resource or care management needs]  Labs Reviewed Kidney Function  Pharmacy Interventions   Pharmacy Dicussed/Reviewed Pharmacy Topics Discussed, Pharmacy Topics Reviewed, Medications and their functions   Safety Interventions   Safety Discussed/Reviewed Safety Discussed, Safety Reviewed, Home Safety, Fall Risk  Home Safety Assistive Devices       Follow up plan: No further intervention required. Patient's Primary Care office is not partnering with the [facility] for care management and will be providing Care Management Services themselves. This final Care Management note will be securely faxed to the PCP office for handoff. Patient has been encouraged to reach out to their PCP office with any resource or care management needs.   Encounter Outcome:  Patient Visit Completed   Demetrios Loll, RN, BSN [place]  [facility], [facility] RN Care Manager Direct Dial: [phone]

## 2023-06-07 DIAGNOSIS — N186 End stage renal disease: Secondary | ICD-10-CM | POA: Diagnosis not present

## 2023-06-07 DIAGNOSIS — Z992 Dependence on renal dialysis: Secondary | ICD-10-CM | POA: Diagnosis not present

## 2023-06-09 DIAGNOSIS — N186 End stage renal disease: Secondary | ICD-10-CM | POA: Diagnosis not present

## 2023-06-09 DIAGNOSIS — Z992 Dependence on renal dialysis: Secondary | ICD-10-CM | POA: Diagnosis not present

## 2023-06-10 DIAGNOSIS — N186 End stage renal disease: Secondary | ICD-10-CM | POA: Diagnosis not present

## 2023-06-10 DIAGNOSIS — Z992 Dependence on renal dialysis: Secondary | ICD-10-CM | POA: Diagnosis not present

## 2023-06-14 DIAGNOSIS — N186 End stage renal disease: Secondary | ICD-10-CM | POA: Diagnosis not present

## 2023-06-14 DIAGNOSIS — Z992 Dependence on renal dialysis: Secondary | ICD-10-CM | POA: Diagnosis not present

## 2023-06-16 DIAGNOSIS — Z992 Dependence on renal dialysis: Secondary | ICD-10-CM | POA: Diagnosis not present

## 2023-06-16 DIAGNOSIS — N186 End stage renal disease: Secondary | ICD-10-CM | POA: Diagnosis not present

## 2023-06-18 DIAGNOSIS — N186 End stage renal disease: Secondary | ICD-10-CM | POA: Diagnosis not present

## 2023-06-18 DIAGNOSIS — Z992 Dependence on renal dialysis: Secondary | ICD-10-CM | POA: Diagnosis not present

## 2023-06-19 DIAGNOSIS — Z20822 Contact with and (suspected) exposure to covid-19: Secondary | ICD-10-CM | POA: Diagnosis not present

## 2023-06-19 DIAGNOSIS — R059 Cough, unspecified: Secondary | ICD-10-CM | POA: Diagnosis not present

## 2023-06-21 DIAGNOSIS — N186 End stage renal disease: Secondary | ICD-10-CM | POA: Diagnosis not present

## 2023-06-21 DIAGNOSIS — Z992 Dependence on renal dialysis: Secondary | ICD-10-CM | POA: Diagnosis not present

## 2023-06-23 DIAGNOSIS — N186 End stage renal disease: Secondary | ICD-10-CM | POA: Diagnosis not present

## 2023-06-23 DIAGNOSIS — Z992 Dependence on renal dialysis: Secondary | ICD-10-CM | POA: Diagnosis not present

## 2023-06-25 DIAGNOSIS — N186 End stage renal disease: Secondary | ICD-10-CM | POA: Diagnosis not present

## 2023-06-25 DIAGNOSIS — Z992 Dependence on renal dialysis: Secondary | ICD-10-CM | POA: Diagnosis not present

## 2023-06-28 DIAGNOSIS — N186 End stage renal disease: Secondary | ICD-10-CM | POA: Diagnosis not present

## 2023-06-28 DIAGNOSIS — Z992 Dependence on renal dialysis: Secondary | ICD-10-CM | POA: Diagnosis not present

## 2023-06-30 DIAGNOSIS — N186 End stage renal disease: Secondary | ICD-10-CM | POA: Diagnosis not present

## 2023-06-30 DIAGNOSIS — Z992 Dependence on renal dialysis: Secondary | ICD-10-CM | POA: Diagnosis not present

## 2023-06-30 DIAGNOSIS — R0602 Shortness of breath: Secondary | ICD-10-CM | POA: Diagnosis not present

## 2023-06-30 DIAGNOSIS — R0902 Hypoxemia: Secondary | ICD-10-CM | POA: Diagnosis not present

## 2023-07-01 DIAGNOSIS — Z992 Dependence on renal dialysis: Secondary | ICD-10-CM | POA: Diagnosis not present

## 2023-07-01 DIAGNOSIS — N186 End stage renal disease: Secondary | ICD-10-CM | POA: Diagnosis not present

## 2023-07-02 DIAGNOSIS — N186 End stage renal disease: Secondary | ICD-10-CM | POA: Diagnosis not present

## 2023-07-02 DIAGNOSIS — Z992 Dependence on renal dialysis: Secondary | ICD-10-CM | POA: Diagnosis not present

## 2023-07-04 DIAGNOSIS — N186 End stage renal disease: Secondary | ICD-10-CM | POA: Diagnosis not present

## 2023-07-04 DIAGNOSIS — Z992 Dependence on renal dialysis: Secondary | ICD-10-CM | POA: Diagnosis not present

## 2023-07-05 DIAGNOSIS — N186 End stage renal disease: Secondary | ICD-10-CM | POA: Diagnosis not present

## 2023-07-05 DIAGNOSIS — Z992 Dependence on renal dialysis: Secondary | ICD-10-CM | POA: Diagnosis not present

## 2023-07-06 DIAGNOSIS — I10 Essential (primary) hypertension: Secondary | ICD-10-CM | POA: Diagnosis not present

## 2023-07-06 DIAGNOSIS — D638 Anemia in other chronic diseases classified elsewhere: Secondary | ICD-10-CM | POA: Diagnosis not present

## 2023-07-06 DIAGNOSIS — E782 Mixed hyperlipidemia: Secondary | ICD-10-CM | POA: Diagnosis not present

## 2023-07-06 DIAGNOSIS — Z681 Body mass index (BMI) 19 or less, adult: Secondary | ICD-10-CM | POA: Diagnosis not present

## 2023-07-06 DIAGNOSIS — J449 Chronic obstructive pulmonary disease, unspecified: Secondary | ICD-10-CM | POA: Diagnosis not present

## 2023-07-06 DIAGNOSIS — Z0001 Encounter for general adult medical examination with abnormal findings: Secondary | ICD-10-CM | POA: Diagnosis not present

## 2023-07-06 DIAGNOSIS — Z1389 Encounter for screening for other disorder: Secondary | ICD-10-CM | POA: Diagnosis not present

## 2023-07-06 DIAGNOSIS — Z9189 Other specified personal risk factors, not elsewhere classified: Secondary | ICD-10-CM | POA: Diagnosis not present

## 2023-07-07 DIAGNOSIS — Z992 Dependence on renal dialysis: Secondary | ICD-10-CM | POA: Diagnosis not present

## 2023-07-07 DIAGNOSIS — N186 End stage renal disease: Secondary | ICD-10-CM | POA: Diagnosis not present

## 2023-07-08 DIAGNOSIS — N186 End stage renal disease: Secondary | ICD-10-CM | POA: Diagnosis not present

## 2023-07-08 DIAGNOSIS — Z992 Dependence on renal dialysis: Secondary | ICD-10-CM | POA: Diagnosis not present

## 2023-07-09 DIAGNOSIS — Z992 Dependence on renal dialysis: Secondary | ICD-10-CM | POA: Diagnosis not present

## 2023-07-09 DIAGNOSIS — N186 End stage renal disease: Secondary | ICD-10-CM | POA: Diagnosis not present

## 2023-07-12 DIAGNOSIS — N186 End stage renal disease: Secondary | ICD-10-CM | POA: Diagnosis not present

## 2023-07-12 DIAGNOSIS — Z992 Dependence on renal dialysis: Secondary | ICD-10-CM | POA: Diagnosis not present

## 2023-07-14 DIAGNOSIS — N186 End stage renal disease: Secondary | ICD-10-CM | POA: Diagnosis not present

## 2023-07-14 DIAGNOSIS — Z992 Dependence on renal dialysis: Secondary | ICD-10-CM | POA: Diagnosis not present

## 2023-07-16 DIAGNOSIS — Z992 Dependence on renal dialysis: Secondary | ICD-10-CM | POA: Diagnosis not present

## 2023-07-16 DIAGNOSIS — N186 End stage renal disease: Secondary | ICD-10-CM | POA: Diagnosis not present

## 2023-07-19 DIAGNOSIS — Z992 Dependence on renal dialysis: Secondary | ICD-10-CM | POA: Diagnosis not present

## 2023-07-19 DIAGNOSIS — N186 End stage renal disease: Secondary | ICD-10-CM | POA: Diagnosis not present

## 2023-07-21 DIAGNOSIS — N186 End stage renal disease: Secondary | ICD-10-CM | POA: Diagnosis not present

## 2023-07-21 DIAGNOSIS — Z992 Dependence on renal dialysis: Secondary | ICD-10-CM | POA: Diagnosis not present

## 2023-07-23 DIAGNOSIS — Z992 Dependence on renal dialysis: Secondary | ICD-10-CM | POA: Diagnosis not present

## 2023-07-23 DIAGNOSIS — N186 End stage renal disease: Secondary | ICD-10-CM | POA: Diagnosis not present

## 2023-07-26 DIAGNOSIS — E785 Hyperlipidemia, unspecified: Secondary | ICD-10-CM | POA: Diagnosis not present

## 2023-07-26 DIAGNOSIS — Z992 Dependence on renal dialysis: Secondary | ICD-10-CM | POA: Diagnosis not present

## 2023-07-26 DIAGNOSIS — Z79899 Other long term (current) drug therapy: Secondary | ICD-10-CM | POA: Diagnosis not present

## 2023-07-26 DIAGNOSIS — N186 End stage renal disease: Secondary | ICD-10-CM | POA: Diagnosis not present

## 2023-07-28 DIAGNOSIS — Z992 Dependence on renal dialysis: Secondary | ICD-10-CM | POA: Diagnosis not present

## 2023-07-28 DIAGNOSIS — N186 End stage renal disease: Secondary | ICD-10-CM | POA: Diagnosis not present

## 2023-07-30 DIAGNOSIS — Z992 Dependence on renal dialysis: Secondary | ICD-10-CM | POA: Diagnosis not present

## 2023-07-30 DIAGNOSIS — N186 End stage renal disease: Secondary | ICD-10-CM | POA: Diagnosis not present

## 2023-08-01 DIAGNOSIS — N186 End stage renal disease: Secondary | ICD-10-CM | POA: Diagnosis not present

## 2023-08-01 DIAGNOSIS — Z992 Dependence on renal dialysis: Secondary | ICD-10-CM | POA: Diagnosis not present

## 2023-08-02 DIAGNOSIS — N186 End stage renal disease: Secondary | ICD-10-CM | POA: Diagnosis not present

## 2023-08-02 DIAGNOSIS — Z992 Dependence on renal dialysis: Secondary | ICD-10-CM | POA: Diagnosis not present

## 2023-08-04 DIAGNOSIS — Z992 Dependence on renal dialysis: Secondary | ICD-10-CM | POA: Diagnosis not present

## 2023-08-04 DIAGNOSIS — N186 End stage renal disease: Secondary | ICD-10-CM | POA: Diagnosis not present

## 2023-08-06 DIAGNOSIS — Z992 Dependence on renal dialysis: Secondary | ICD-10-CM | POA: Diagnosis not present

## 2023-08-06 DIAGNOSIS — N186 End stage renal disease: Secondary | ICD-10-CM | POA: Diagnosis not present

## 2023-08-09 DIAGNOSIS — N186 End stage renal disease: Secondary | ICD-10-CM | POA: Diagnosis not present

## 2023-08-09 DIAGNOSIS — Z992 Dependence on renal dialysis: Secondary | ICD-10-CM | POA: Diagnosis not present

## 2023-08-11 DIAGNOSIS — N186 End stage renal disease: Secondary | ICD-10-CM | POA: Diagnosis not present

## 2023-08-11 DIAGNOSIS — Z992 Dependence on renal dialysis: Secondary | ICD-10-CM | POA: Diagnosis not present

## 2023-08-13 DIAGNOSIS — N186 End stage renal disease: Secondary | ICD-10-CM | POA: Diagnosis not present

## 2023-08-13 DIAGNOSIS — Z992 Dependence on renal dialysis: Secondary | ICD-10-CM | POA: Diagnosis not present

## 2023-08-18 ENCOUNTER — Other Ambulatory Visit: Payer: Self-pay

## 2023-08-18 ENCOUNTER — Encounter (HOSPITAL_COMMUNITY)
Admission: RE | Disposition: A | Discharge status: Home / Self Care | Payer: Self-pay | Source: Home / Self Care | Attending: Nephrology

## 2023-08-18 ENCOUNTER — Ambulatory Visit (HOSPITAL_COMMUNITY)
Admission: RE | Admit: 2023-08-18 | Discharge: 2023-08-18 | Disposition: A | Discharge status: Home / Self Care | Attending: Nephrology | Admitting: Nephrology

## 2023-08-18 DIAGNOSIS — T82868A Thrombosis of vascular prosthetic devices, implants and grafts, initial encounter: Secondary | ICD-10-CM | POA: Insufficient documentation

## 2023-08-18 DIAGNOSIS — Z992 Dependence on renal dialysis: Secondary | ICD-10-CM | POA: Diagnosis not present

## 2023-08-18 DIAGNOSIS — N186 End stage renal disease: Secondary | ICD-10-CM | POA: Diagnosis not present

## 2023-08-18 DIAGNOSIS — Z87891 Personal history of nicotine dependence: Secondary | ICD-10-CM | POA: Insufficient documentation

## 2023-08-18 DIAGNOSIS — I871 Compression of vein: Secondary | ICD-10-CM | POA: Insufficient documentation

## 2023-08-18 DIAGNOSIS — I120 Hypertensive chronic kidney disease with stage 5 chronic kidney disease or end stage renal disease: Secondary | ICD-10-CM | POA: Diagnosis not present

## 2023-08-18 DIAGNOSIS — N250 Renal osteodystrophy: Secondary | ICD-10-CM | POA: Insufficient documentation

## 2023-08-18 DIAGNOSIS — Y832 Surgical operation with anastomosis, bypass or graft as the cause of abnormal reaction of the patient, or of later complication, without mention of misadventure at the time of the procedure: Secondary | ICD-10-CM | POA: Insufficient documentation

## 2023-08-18 DIAGNOSIS — D631 Anemia in chronic kidney disease: Secondary | ICD-10-CM | POA: Diagnosis not present

## 2023-08-18 DIAGNOSIS — T82858A Stenosis of vascular prosthetic devices, implants and grafts, initial encounter: Secondary | ICD-10-CM | POA: Diagnosis not present

## 2023-08-18 HISTORY — PX: PERIPHERAL VASCULAR THROMBECTOMY: CATH118306

## 2023-08-18 HISTORY — PX: VENOUS ANGIOPLASTY: CATH118376

## 2023-08-18 SURGERY — PERIPHERAL VASCULAR THROMBECTOMY
Anesthesia: LOCAL | Laterality: Left

## 2023-08-18 MED ORDER — HEPARIN SODIUM (PORCINE) 1000 UNIT/ML IJ SOLN
INTRAMUSCULAR | Status: DC | PRN
  Administered 2023-08-18: 5000 [IU] via INTRAVENOUS

## 2023-08-18 MED ORDER — FENTANYL CITRATE (PF) 100 MCG/2ML IJ SOLN
INTRAMUSCULAR | Status: AC
  Filled 2023-08-18: qty 2

## 2023-08-18 MED ORDER — FENTANYL CITRATE (PF) 100 MCG/2ML IJ SOLN
INTRAMUSCULAR | Status: DC | PRN
  Administered 2023-08-18: 50 ug via INTRAVENOUS

## 2023-08-18 MED ORDER — MIDAZOLAM HCL 2 MG/2ML IJ SOLN
INTRAMUSCULAR | Status: DC | PRN
Start: 2023-08-18 — End: 2023-08-18
  Administered 2023-08-18: 1 mg via INTRAVENOUS

## 2023-08-18 MED ORDER — HEPARIN (PORCINE) IN NACL 1000-0.9 UT/500ML-% IV SOLN
INTRAVENOUS | Status: DC | PRN
  Administered 2023-08-18: 500 mL

## 2023-08-18 MED ORDER — LIDOCAINE HCL (PF) 1 % IJ SOLN
INTRAMUSCULAR | Status: AC
  Filled 2023-08-18: qty 30

## 2023-08-18 MED ORDER — MIDAZOLAM HCL 2 MG/2ML IJ SOLN
INTRAMUSCULAR | Status: AC
  Filled 2023-08-18: qty 2

## 2023-08-18 MED ORDER — IODIXANOL 320 MG/ML IV SOLN
INTRAVENOUS | Status: DC | PRN
  Administered 2023-08-18: 8 mL via INTRAVENOUS

## 2023-08-18 MED ORDER — LIDOCAINE HCL (PF) 1 % IJ SOLN
INTRAMUSCULAR | Status: DC | PRN
  Administered 2023-08-18 (×2): 2 mL via SUBCUTANEOUS

## 2023-08-18 SURGICAL SUPPLY — 13 items
BAG SNAP BAND KOVER 36X36 (MISCELLANEOUS) ×2 IMPLANT
BALLN MUSTANG 8X80X75 (BALLOONS) ×2 IMPLANT
BALLOON FOGARTY 5FR 40 (CATHETERS) ×1 IMPLANT
BALLOON MUSTANG 8X80X75 (BALLOONS) ×1 IMPLANT
CATH ANGIO 5F BER2 65CM (CATHETERS) ×1 IMPLANT
CATH FOGARTY BALL 5FR 40 (CATHETERS) ×2 IMPLANT
CATH STR 7FR 55 BRITE (CATHETERS) ×1 IMPLANT
COVER DOME SNAP 22 D (MISCELLANEOUS) ×2 IMPLANT
GUIDEWIRE ANGLED .035 180CM (WIRE) ×2 IMPLANT
SHEATH PINNACLE R/O II 5F 6CM (SHEATH) ×1 IMPLANT
SHEATH PINNACLE R/O II 7F 4CM (SHEATH) ×2 IMPLANT
SYR MEDALLION 10ML (SYRINGE) ×1 IMPLANT
TRAY PV CATH (CUSTOM PROCEDURE TRAY) ×2 IMPLANT

## 2023-08-18 NOTE — Discharge Instructions (Signed)

## 2023-08-18 NOTE — H&P (Addendum)
 Chief Complaint: Decreased flows  Interval H&P  The patient has presented today for an angiogram/ angioplasty.  Various methods of treatment have been discussed with the patient.  After consideration of risk, benefits and other options for treatment, the patient has consented to a angiogram/ angioplasty with  possible stent placement.   Risks of angiogram with potential angioplasty and stenting if needed.contrast reaction, extravasation/ bleeding, dissection, hypotension and death were explained to the patient.  The patient's history has been reviewed and the patient has been examined, no changes in status.  Stable for angiogram/angioplasty  I have reviewed the patient's chart and labs.  Questions were answered to the patient's satisfaction.  Assessment/Plan: ESRD dialyzing a t [place] TTS regimen Thrombosed left upper arm AV graft with a tapered inflow placed by Dr. Rosalva Comber on November 29, 2022 with outflow 8 mm angioplasty on May 02, 2023 by Dr. Susi Eric. Planning on declot with angioplasty. Renal osteodystrophy - continue binders per home regimen. Anemia - managed with ESA's and IV iron at dialysis center. HTN - resume home regimen.   HPI: Sheri Cooper is an 73 y.o. female history of COPD, peptic ulcer disease, rheumatoid arthritis, end-stage renal disease on dialysis Tuesday Thursdays and Saturdays in [place].  Patient is here for a thrombosed AV graft.  She states that she sometimes does have hypotensive episodes during dialysis.  ROS Per HPI.  Chemistry and CBC: Creatinine, Ser  Date/Time Value Ref Range Status  05/02/2023 05:51 AM 7.30 (H) 0.44 - 1.00 mg/dL Final  69/62/9528 41:32 AM 4.83 (H) 0.44 - 1.00 mg/dL Final  44/05/270 53:66 AM 5.23 (H) 0.44 - 1.00 mg/dL Final    Comment:    DELTA CHECK NOTED  01/26/2023 04:32 AM 3.46 (H) 0.44 - 1.00 mg/dL Final  44/07/4740 59:56 AM 5.78 (H) 0.44 - 1.00 mg/dL Final  38/75/6433 29:51 AM 5.70 (H) 0.44 - 1.00 mg/dL Final  88/41/6606  30:16 AM 3.56 (H) 0.44 - 1.00 mg/dL Final    Comment:    DELTA CHECK NOTED  09/08/2022 04:47 AM 1.82 (H) 0.44 - 1.00 mg/dL Final    Comment:    DELTA CHECK NOTED  09/07/2022 09:00 AM 3.80 (H) 0.44 - 1.00 mg/dL Final  05/11/3233 57:32 PM 1.50 (H) 0.44 - 1.00 mg/dL Final  20/25/4270 62:37 AM 2.88 (H) 0.44 - 1.00 mg/dL Final  62/83/1517 61:60 AM 2.31 (H) 0.44 - 1.00 mg/dL Final  73/71/0626 94:85 AM 1.72 (H) 0.44 - 1.00 mg/dL Final  46/27/0350 09:38 AM 1.68 (H) 0.44 - 1.00 mg/dL Final  18/29/9371 69:67 AM 2.13 (H) 0.44 - 1.00 mg/dL Final  89/38/1017 51:02 AM 2.20 (H) 0.44 - 1.00 mg/dL Final  58/52/7782 42:35 AM 1.70 (H) 0.44 - 1.00 mg/dL Final  36/14/4315 40:08 AM 2.34 (H) 0.44 - 1.00 mg/dL Final  67/61/9509 32:67 AM 1.88 (H) 0.44 - 1.00 mg/dL Final  12/45/8099 83:38 AM 2.55 (H) 0.44 - 1.00 mg/dL Final  25/09/3974 73:41 AM 1.71 (H) 0.44 - 1.00 mg/dL Final  93/79/0240 97:35 AM 1.76 (H) 0.44 - 1.00 mg/dL Final    Comment:    DELTA CHECK NOTED  05/27/2022 04:20 AM 2.98 (H) 0.44 - 1.00 mg/dL Final  32/99/2426 83:41 AM 2.00 (H) 0.44 - 1.00 mg/dL Final    Comment:    DELTA CHECK NOTED  05/25/2022 04:04 AM 3.10 (H) 0.44 - 1.00 mg/dL Final  96/22/2979 89:21 AM 2.54 (H) 0.44 - 1.00 mg/dL Final  19/41/7408 14:48 AM 3.54 (H) 0.44 - 1.00 mg/dL Final  18/56/3149 70:26  AM 2.73 (H) 0.44 - 1.00 mg/dL Final  52/84/1324 40:10 AM 4.03 (H) 0.44 - 1.00 mg/dL Final  27/25/3664 40:34 AM 5.92 (H) 0.44 - 1.00 mg/dL Final  74/25/9563 87:56 AM 5.57 (H) 0.44 - 1.00 mg/dL Final  43/32/9518 84:16 AM 8.12 (H) 0.44 - 1.00 mg/dL Final  60/63/0160 10:93 AM 7.51 (H) 0.44 - 1.00 mg/dL Final  23/55/7322 02:54 AM 7.50 (H) 0.44 - 1.00 mg/dL Final   No results for input(s): "NA", "K", "CL", "CO2", "GLUCOSE", "BUN", "CREATININE", "CALCIUM", "PHOS" in the last 168 hours.  Invalid input(s): "ALB" No results for input(s): "WBC", "NEUTROABS", "HGB", "HCT", "MCV", "PLT" in the last 168 hours. Liver Function Tests: No  results for input(s): "AST", "ALT", "ALKPHOS", "BILITOT", "PROT", "ALBUMIN" in the last 168 hours. No results for input(s): "LIPASE", "AMYLASE" in the last 168 hours. No results for input(s): "AMMONIA" in the last 168 hours. Cardiac Enzymes: No results for input(s): "CKTOTAL", "CKMB", "CKMBINDEX", "TROPONINI" in the last 168 hours. Iron Studies: No results for input(s): "IRON", "TIBC", "TRANSFERRIN", "FERRITIN" in the last 72 hours. PT/INR: @LABRCNTIP (inr:5)  Xrays/Other Studies: )No results found for this or any previous visit (from the past 48 hours). No results found.  PMH:   Past Medical History:  Diagnosis Date   Anemia    Chronic kidney disease (CKD), stage IV (severe) (HCC)    COPD (chronic obstructive pulmonary disease) (HCC)    ESRD (end stage renal disease) (HCC)    Hemodialysis T, TH, SAT, [address] , [place]   GERD (gastroesophageal reflux disease)    History of blood transfusion    History of blood transfusion    Hypertension    Pneumonia    3 times in 2024-   PUD (peptic ulcer disease) 05/2022   Rheumatoid arthritis (HCC)    Rheumatoid arthritis (HCC)     PSH:   Past Surgical History:  Procedure Laterality Date   A/V SHUNTOGRAM N/A 05/02/2023   Procedure: A/V SHUNTOGRAM;  Surgeon: Daria Pastures, MD;  Location: [facility] INVASIVE CV LAB;  Service: Cardiovascular;  Laterality: N/A;   AV FISTULA PLACEMENT Left 11/29/2022   Procedure: INSERTION OF LEFT UPPER  ARTERIOVENOUS (AV) GORE-TEX GRAFT ARM;  Surgeon: Victorino Sparrow, MD;  Location: [facility] OR;  Service: Vascular;  Laterality: Left;   CENTRAL VENOUS CATHETER INSERTION Left 05/27/2022   Procedure: INSERTION CENTRAL LINE ADULT;  Surgeon: Lucretia Roers, MD;  Location: [facility] ORS;  Service: General;  Laterality: Left;   COLONOSCOPY WITH PROPOFOL N/A 05/22/2022   Surgeon: Marguerita Merles, Reuel Boom, MD; blood in the terminal ileum and colon.   ESOPHAGOGASTRODUODENOSCOPY (EGD) WITH PROPOFOL N/A 05/22/2022   Surgeon:  Marguerita Merles, Reuel Boom, MD; peptic ulcer disease with 2 nonbleeding cratered gastric ulcers, multiple duodenal ulcers, 2 of which had adherent clots that were removed and revealed 2 vessels.  1 vessel was clipped, cauterized, and treated with epinephrine injection.  Second vessel was clipped and treated with cautery, clip fell off.   IR FLUORO GUIDE CV LINE RIGHT  05/24/2022   IR US GUIDE VASC ACCESS RIGHT  05/24/2022   LAPAROTOMY N/A 05/27/2022   Procedure: EXPLORATORY LAPAROTOMY, OMENTAL PATH, DRAIN PLACEMENT;  Surgeon: Lucretia Roers, MD;  Location: [facility] ORS;  Service: General;  Laterality: N/A;   PERIPHERAL VASCULAR BALLOON ANGIOPLASTY  05/02/2023   Procedure: PERIPHERAL VASCULAR BALLOON ANGIOPLASTY;  Surgeon: Daria Pastures, MD;  Location: [facility] INVASIVE CV LAB;  Service: Cardiovascular;;   VOLVULUS REDUCTION     colon resection    Allergies:  No Known Allergies  Medications:   Prior to Admission medications   Medication Sig Start Date End Date Taking? Authorizing Provider  amLODipine (NORVASC) 10 MG tablet Take 10 mg by mouth daily.   Yes [provider]  atorvastatin (LIPITOR) 40 MG tablet Take 40 mg by mouth at bedtime. 09/22/22  Yes [provider]  b complex-vitamin c-folic acid (NEPHRO-VITE) 0.8 MG TABS tablet Take 1 tablet by mouth at bedtime.   Yes [provider]  hydrALAZINE (APRESOLINE) 100 MG tablet Take 100 mg by mouth daily. 07/14/22  Yes [provider]  metoprolol succinate (TOPROL-XL) 50 MG 24 hr tablet Take 50 mg by mouth daily. 10/15/22  Yes [provider]  pantoprazole (PROTONIX) 40 MG tablet Take 1 tablet (40 mg total) by mouth 2 (two) times daily. 06/05/22 08/17/23 Yes Justina Oman, MD  sevelamer carbonate (RENVELA) 800 MG tablet Take 2,400 mg by mouth 3 (three) times daily with meals.   Yes [provider]    Discontinued Meds:   Medications Discontinued During This Encounter  Medication Reason   hydrALAZINE  (APRESOLINE) 25 MG tablet Patient Preference   multivitamin (RENA-VIT) TABS tablet Patient Preference   sertraline (ZOLOFT) 50 MG tablet Patient Preference   furosemide (LASIX) 80 MG tablet Patient Preference   losartan (COZAAR) 50 MG tablet Patient Preference    Social History:  reports that she has quit smoking. Her smoking use included cigarettes. She has been exposed to tobacco smoke. She has never used smokeless tobacco. She reports that she does not currently use alcohol. She reports that she does not use drugs.  Family History:  No family history on file.  Blood pressure (!) 195/68, pulse 81, resp. rate 16, weight 42.6 kg, SpO2 98%. GEN: NAD, A&Ox3, NCAT HEENT: No conjunctival pallor, EOMI NECK: Supple, no thyromegaly LUNGS: CTA B/L no rales, rhonchi or wheezing CV: RRR, No M/R/G ABD: SNDNT +BS  EXT: No lower extremity edema ACCESS: Left upper arm AV graft no bruit       Dyan Creelman, Alveda Aures, MD 08/18/2023, 7:19 AM

## 2023-08-18 NOTE — Op Note (Signed)
 Patient referred for a thrombectomy of November 29, 2022.    On examination there is no pulse or bruit in the left upper arm arc graft. The left radial pulse is 1+.   Summary:  1) Successful thrombectomy of a left upper arm arc AVG with evidence of 80% mid-intragraft stenosis + 50-70% VAS which were corrected with angioplasty (8 Mustang  FE ~18 ATM). 2) Arterial anastomosis and inflow graft segments are patent. 3) Patent  left axillary vein and left sided central veins. 4) This left upper arm arc graft remains amenable to future percutaneous intervention.  Description of procedure: The left arm was prepped and draped in the usual fashion. The left upper arm arc AVG was first cannulated (91478) in the arterial limb in an antegrade direction and then a 7Fr sheath was inserted by guidewire exchange technique. A 0.035 guidewire was passed through the clotted graft into the central veins under fluoroscopic guidance and then a 5Fr diagnostic catheter inserted over the wire. The wire was removed, and then intravenous heparin and sedative drugs administered. A pullback angiogram was performed by injecting contrast (G9562) via the diagnostic catheter. This showed patent central veins, patent left axillary vein, 50-70% venous anastomosis stenosis with an 80% mid-intragraft stenosis and evidence of filling defects in the graft consistent with thrombus.  Three passes were made with the 7Fr [facility] 1 catheter from the axillary vein to the antegrade sheath to physically remove thrombus and perform the thrombectomy (13086). The catheter was removed, and an 8 x 8 Gladiator angioplasty balloon was inserted over the wire to the level of the [place] -> mid-intragraft lesion followed by venous angioplasty (57846) carried out to 18ATM at only the site of stenosis to FULL effacement and then more gently in the rest of the graft circuit to macerate the thrombus.    In order to remove this clot and remove the platelet plug at the arterial  anastomosis, a second cannulation (96295) was required in a retrograde direction. A 7Fr sheath was inserted by guidewire exchange technique. A 4Fr Fogarty catheter was inserted through the sheath and advanced into the proximal brachial artery. The over the wire embolectomy balloon was inflated with 0.6cc of contrast and then pulled across the arterial anastomosis into the graft with aspiration of the sheath via the side port to remove the platelet plug and thrombus. [phone]). This step was repeated to sweep all residual clot from this side of the graft. In order to check the arterial inflow an arteriogram was necessary because refluxing contrast from the graft would have caused risk of embolizing thrombus from the graft into the arterial vasculature. A glidewire and straight catheter were inserted into the proximal brachial artery and an arteriogram performed by parking the tip of the catheter 2cm proximal to the arterial anastomosis. The arteriogram documented a good calibered proximal and distal brachial artery with slow distal runoff and no evidence of distal emboli. Follow up angiogram showed good blood flow through the graft circuit with some evidence of retained thrombus within the body of the graft. I then polished the entire outflow tract/graft circuit with the partially inflated 8mm angioplasty balloon to alleviate thrombus burden.  Completion outflow angiogram revealed rapid flows, no further retained thrombus burden, 10% residual stenosis at [facility] J+ mid-intragraft lesion with no dissection or extravasation. The graft had a good thrill and bruit.  Hemostasis: A 3-0 ethilon purse string suture was placed at the cannulation site on removal of the sheath.  Sedation: 1 mg Versed, 50mcg  Fentanyl. Sedation time. 20  minutes  Contrast. 8 mL  Monitoring: Because of the patient's comorbid conditions and sedation during the procedure, continuous EKG monitoring and O2 saturation monitoring was performed  throughout the procedure by the RN. There were no abnormal arrhythmias encountered.  Complications: None.   Diagnoses: N18.6 End stage renal disease  T82.858A Stenosis of vascular prosthetic devices, implants and grafts, initial encounter T82.868A Thrombosis of vascular prosthetic device/graft, initial  Procedure Coding:  [phone] Cannulation and angiogram of fistula/ graft, thrombectomy and venous angioplasty U0454 Contrast  Recommendations:  1. Continue to cannulate the graft with 15G needles.  2. Refer back for problems with flows. 3. Remove the sutures next treatment.   Discharge: The patient was discharged home in stable condition. The patient was given education regarding the care of the dialysis access AVF and specific instructions in case of any problems.

## 2023-08-19 ENCOUNTER — Encounter (HOSPITAL_COMMUNITY): Payer: Self-pay | Admitting: Nephrology

## 2023-08-19 DIAGNOSIS — Z20822 Contact with and (suspected) exposure to covid-19: Secondary | ICD-10-CM | POA: Diagnosis not present

## 2023-08-19 DIAGNOSIS — U071 COVID-19: Secondary | ICD-10-CM | POA: Diagnosis not present

## 2023-08-20 DIAGNOSIS — N186 End stage renal disease: Secondary | ICD-10-CM | POA: Diagnosis not present

## 2023-08-20 DIAGNOSIS — Z992 Dependence on renal dialysis: Secondary | ICD-10-CM | POA: Diagnosis not present

## 2023-08-22 DIAGNOSIS — N186 End stage renal disease: Secondary | ICD-10-CM | POA: Diagnosis not present

## 2023-08-22 DIAGNOSIS — E876 Hypokalemia: Secondary | ICD-10-CM | POA: Diagnosis not present

## 2023-08-23 DIAGNOSIS — N186 End stage renal disease: Secondary | ICD-10-CM | POA: Diagnosis not present

## 2023-08-23 DIAGNOSIS — Z992 Dependence on renal dialysis: Secondary | ICD-10-CM | POA: Diagnosis not present

## 2023-08-25 DIAGNOSIS — Z992 Dependence on renal dialysis: Secondary | ICD-10-CM | POA: Diagnosis not present

## 2023-08-25 DIAGNOSIS — N186 End stage renal disease: Secondary | ICD-10-CM | POA: Diagnosis not present

## 2023-08-27 DIAGNOSIS — Z992 Dependence on renal dialysis: Secondary | ICD-10-CM | POA: Diagnosis not present

## 2023-08-27 DIAGNOSIS — N186 End stage renal disease: Secondary | ICD-10-CM | POA: Diagnosis not present

## 2023-08-30 DIAGNOSIS — Z992 Dependence on renal dialysis: Secondary | ICD-10-CM | POA: Diagnosis not present

## 2023-08-30 DIAGNOSIS — N186 End stage renal disease: Secondary | ICD-10-CM | POA: Diagnosis not present

## 2023-08-31 DIAGNOSIS — N186 End stage renal disease: Secondary | ICD-10-CM | POA: Diagnosis not present

## 2023-08-31 DIAGNOSIS — Z992 Dependence on renal dialysis: Secondary | ICD-10-CM | POA: Diagnosis not present

## 2023-09-01 DIAGNOSIS — N186 End stage renal disease: Secondary | ICD-10-CM | POA: Diagnosis not present

## 2023-09-01 DIAGNOSIS — Z992 Dependence on renal dialysis: Secondary | ICD-10-CM | POA: Diagnosis not present

## 2023-09-03 DIAGNOSIS — Z992 Dependence on renal dialysis: Secondary | ICD-10-CM | POA: Diagnosis not present

## 2023-09-03 DIAGNOSIS — N186 End stage renal disease: Secondary | ICD-10-CM | POA: Diagnosis not present

## 2023-09-06 DIAGNOSIS — Z992 Dependence on renal dialysis: Secondary | ICD-10-CM | POA: Diagnosis not present

## 2023-09-06 DIAGNOSIS — N186 End stage renal disease: Secondary | ICD-10-CM | POA: Diagnosis not present

## 2023-09-08 ENCOUNTER — Encounter (HOSPITAL_COMMUNITY): Payer: Self-pay | Admitting: Emergency Medicine

## 2023-09-08 ENCOUNTER — Inpatient Hospital Stay (HOSPITAL_COMMUNITY)
Admission: EM | Admit: 2023-09-08 | Discharge: 2023-09-15 | DRG: 189 | Disposition: A | Discharge status: Home / Self Care | Attending: Internal Medicine | Admitting: Internal Medicine

## 2023-09-08 ENCOUNTER — Emergency Department (HOSPITAL_COMMUNITY)

## 2023-09-08 ENCOUNTER — Other Ambulatory Visit: Payer: Self-pay

## 2023-09-08 DIAGNOSIS — Z992 Dependence on renal dialysis: Secondary | ICD-10-CM | POA: Diagnosis not present

## 2023-09-08 DIAGNOSIS — K219 Gastro-esophageal reflux disease without esophagitis: Secondary | ICD-10-CM | POA: Diagnosis present

## 2023-09-08 DIAGNOSIS — J90 Pleural effusion, not elsewhere classified: Secondary | ICD-10-CM | POA: Diagnosis not present

## 2023-09-08 DIAGNOSIS — I132 Hypertensive heart and chronic kidney disease with heart failure and with stage 5 chronic kidney disease, or end stage renal disease: Secondary | ICD-10-CM | POA: Diagnosis present

## 2023-09-08 DIAGNOSIS — R918 Other nonspecific abnormal finding of lung field: Secondary | ICD-10-CM | POA: Diagnosis not present

## 2023-09-08 DIAGNOSIS — J9811 Atelectasis: Secondary | ICD-10-CM | POA: Diagnosis present

## 2023-09-08 DIAGNOSIS — J189 Pneumonia, unspecified organism: Secondary | ICD-10-CM | POA: Diagnosis not present

## 2023-09-08 DIAGNOSIS — Z1152 Encounter for screening for COVID-19: Secondary | ICD-10-CM | POA: Diagnosis not present

## 2023-09-08 DIAGNOSIS — N051 Unspecified nephritic syndrome with focal and segmental glomerular lesions: Secondary | ICD-10-CM | POA: Diagnosis present

## 2023-09-08 DIAGNOSIS — E8889 Other specified metabolic disorders: Secondary | ICD-10-CM | POA: Diagnosis not present

## 2023-09-08 DIAGNOSIS — Z87891 Personal history of nicotine dependence: Secondary | ICD-10-CM

## 2023-09-08 DIAGNOSIS — I5033 Acute on chronic diastolic (congestive) heart failure: Secondary | ICD-10-CM

## 2023-09-08 DIAGNOSIS — D649 Anemia, unspecified: Secondary | ICD-10-CM | POA: Diagnosis present

## 2023-09-08 DIAGNOSIS — K521 Toxic gastroenteritis and colitis: Secondary | ICD-10-CM | POA: Diagnosis not present

## 2023-09-08 DIAGNOSIS — R0902 Hypoxemia: Secondary | ICD-10-CM | POA: Diagnosis not present

## 2023-09-08 DIAGNOSIS — R54 Age-related physical debility: Secondary | ICD-10-CM | POA: Diagnosis present

## 2023-09-08 DIAGNOSIS — Y92239 Unspecified place in hospital as the place of occurrence of the external cause: Secondary | ICD-10-CM | POA: Diagnosis not present

## 2023-09-08 DIAGNOSIS — J9601 Acute respiratory failure with hypoxia: Secondary | ICD-10-CM | POA: Diagnosis not present

## 2023-09-08 DIAGNOSIS — J441 Chronic obstructive pulmonary disease with (acute) exacerbation: Secondary | ICD-10-CM | POA: Diagnosis present

## 2023-09-08 DIAGNOSIS — I517 Cardiomegaly: Secondary | ICD-10-CM | POA: Diagnosis not present

## 2023-09-08 DIAGNOSIS — I959 Hypotension, unspecified: Secondary | ICD-10-CM | POA: Diagnosis not present

## 2023-09-08 DIAGNOSIS — H538 Other visual disturbances: Secondary | ICD-10-CM | POA: Diagnosis not present

## 2023-09-08 DIAGNOSIS — J984 Other disorders of lung: Secondary | ICD-10-CM | POA: Diagnosis not present

## 2023-09-08 DIAGNOSIS — R0602 Shortness of breath: Secondary | ICD-10-CM | POA: Diagnosis not present

## 2023-09-08 DIAGNOSIS — N250 Renal osteodystrophy: Secondary | ICD-10-CM | POA: Diagnosis not present

## 2023-09-08 DIAGNOSIS — N186 End stage renal disease: Secondary | ICD-10-CM | POA: Diagnosis present

## 2023-09-08 DIAGNOSIS — R0689 Other abnormalities of breathing: Secondary | ICD-10-CM | POA: Diagnosis not present

## 2023-09-08 DIAGNOSIS — Z79899 Other long term (current) drug therapy: Secondary | ICD-10-CM | POA: Diagnosis not present

## 2023-09-08 DIAGNOSIS — R059 Cough, unspecified: Secondary | ICD-10-CM | POA: Diagnosis not present

## 2023-09-08 DIAGNOSIS — I6782 Cerebral ischemia: Secondary | ICD-10-CM | POA: Diagnosis not present

## 2023-09-08 DIAGNOSIS — Z8616 Personal history of COVID-19: Secondary | ICD-10-CM | POA: Diagnosis not present

## 2023-09-08 DIAGNOSIS — J181 Lobar pneumonia, unspecified organism: Secondary | ICD-10-CM | POA: Diagnosis present

## 2023-09-08 DIAGNOSIS — M069 Rheumatoid arthritis, unspecified: Secondary | ICD-10-CM | POA: Diagnosis not present

## 2023-09-08 DIAGNOSIS — T3695XA Adverse effect of unspecified systemic antibiotic, initial encounter: Secondary | ICD-10-CM | POA: Diagnosis not present

## 2023-09-08 DIAGNOSIS — R0989 Other specified symptoms and signs involving the circulatory and respiratory systems: Secondary | ICD-10-CM | POA: Diagnosis not present

## 2023-09-08 DIAGNOSIS — Z8711 Personal history of peptic ulcer disease: Secondary | ICD-10-CM

## 2023-09-08 DIAGNOSIS — I120 Hypertensive chronic kidney disease with stage 5 chronic kidney disease or end stage renal disease: Secondary | ICD-10-CM | POA: Diagnosis not present

## 2023-09-08 DIAGNOSIS — N189 Chronic kidney disease, unspecified: Secondary | ICD-10-CM

## 2023-09-08 DIAGNOSIS — D631 Anemia in chronic kidney disease: Secondary | ICD-10-CM | POA: Diagnosis present

## 2023-09-08 DIAGNOSIS — I129 Hypertensive chronic kidney disease with stage 1 through stage 4 chronic kidney disease, or unspecified chronic kidney disease: Secondary | ICD-10-CM | POA: Diagnosis not present

## 2023-09-08 LAB — CBC WITH DIFFERENTIAL/PLATELET
Abs Immature Granulocytes: 0.08 10*3/uL — ABNORMAL HIGH (ref 0.00–0.07)
Basophils Absolute: 0.1 10*3/uL (ref 0.0–0.1)
Basophils Relative: 1 %
Eosinophils Absolute: 0 10*3/uL (ref 0.0–0.5)
Eosinophils Relative: 0 %
HCT: 27.9 % — ABNORMAL LOW (ref 36.0–46.0)
Hemoglobin: 8.9 g/dL — ABNORMAL LOW (ref 12.0–15.0)
Immature Granulocytes: 1 %
Lymphocytes Relative: 8 %
Lymphs Abs: 1.1 10*3/uL (ref 0.7–4.0)
MCH: 28.5 pg (ref 26.0–34.0)
MCHC: 31.9 g/dL (ref 30.0–36.0)
MCV: 89.4 fL (ref 80.0–100.0)
Monocytes Absolute: 0.3 10*3/uL (ref 0.1–1.0)
Monocytes Relative: 3 %
Neutro Abs: 11.1 10*3/uL — ABNORMAL HIGH (ref 1.7–7.7)
Neutrophils Relative %: 87 %
Platelets: 462 10*3/uL — ABNORMAL HIGH (ref 150–400)
RBC: 3.12 MIL/uL — ABNORMAL LOW (ref 3.87–5.11)
RDW: 16.6 % — ABNORMAL HIGH (ref 11.5–15.5)
WBC: 12.6 10*3/uL — ABNORMAL HIGH (ref 4.0–10.5)
nRBC: 0 % (ref 0.0–0.2)

## 2023-09-08 LAB — COMPREHENSIVE METABOLIC PANEL WITH GFR
ALT: 11 U/L (ref 0–44)
AST: 18 U/L (ref 15–41)
Albumin: 2.9 g/dL — ABNORMAL LOW (ref 3.5–5.0)
Alkaline Phosphatase: 91 U/L (ref 38–126)
Anion gap: 11 (ref 5–15)
BUN: 21 mg/dL (ref 8–23)
CO2: 25 mmol/L (ref 22–32)
Calcium: 8.4 mg/dL — ABNORMAL LOW (ref 8.9–10.3)
Chloride: 99 mmol/L (ref 98–111)
Creatinine, Ser: 5.15 mg/dL — ABNORMAL HIGH (ref 0.44–1.00)
GFR, Estimated: 8 mL/min — ABNORMAL LOW (ref >60)
Glucose, Bld: 149 mg/dL — ABNORMAL HIGH (ref 70–99)
Potassium: 3.6 mmol/L (ref 3.5–5.1)
Sodium: 135 mmol/L (ref 135–145)
Total Bilirubin: 0.5 mg/dL (ref 0.0–1.2)
Total Protein: 6.4 g/dL — ABNORMAL LOW (ref 6.5–8.1)

## 2023-09-08 LAB — RESP PANEL BY RT-PCR (RSV, FLU A&B, COVID)  RVPGX2
Influenza A by PCR: NEGATIVE
Influenza B by PCR: NEGATIVE
Resp Syncytial Virus by PCR: NEGATIVE
SARS Coronavirus 2 by RT PCR: NEGATIVE

## 2023-09-08 LAB — BRAIN NATRIURETIC PEPTIDE: B Natriuretic Peptide: 1061 pg/mL — ABNORMAL HIGH (ref 0.0–100.0)

## 2023-09-08 LAB — TROPONIN I (HIGH SENSITIVITY)
Troponin I (High Sensitivity): 41 ng/L — ABNORMAL HIGH (ref <18)
Troponin I (High Sensitivity): 41 ng/L — ABNORMAL HIGH (ref <18)

## 2023-09-08 MED ORDER — IPRATROPIUM-ALBUTEROL 0.5-2.5 (3) MG/3ML IN SOLN
3.0000 mL | RESPIRATORY_TRACT | Status: DC
  Administered 2023-09-08 (×2): 3 mL via RESPIRATORY_TRACT
  Filled 2023-09-08 (×2): qty 3

## 2023-09-08 MED ORDER — METOPROLOL SUCCINATE ER 50 MG PO TB24
50.0000 mg | ORAL_TABLET | Freq: Every day | ORAL | Status: DC
  Administered 2023-09-09 – 2023-09-15 (×5): 50 mg via ORAL
  Filled 2023-09-08 (×6): qty 1

## 2023-09-08 MED ORDER — ACETAMINOPHEN 650 MG RE SUPP: 650.0000 mg | Freq: Four times a day (QID) | RECTAL | Status: DC | PRN

## 2023-09-08 MED ORDER — HYDRALAZINE HCL 50 MG PO TABS
100.0000 mg | ORAL_TABLET | Freq: Every day | ORAL | Status: DC
  Administered 2023-09-09 – 2023-09-12 (×4): 100 mg via ORAL
  Filled 2023-09-08 (×5): qty 2

## 2023-09-08 MED ORDER — HYDRALAZINE HCL 20 MG/ML IJ SOLN
5.0000 mg | INTRAMUSCULAR | Status: DC | PRN
  Administered 2023-09-14: 5 mg via INTRAVENOUS

## 2023-09-08 MED ORDER — IPRATROPIUM-ALBUTEROL 0.5-2.5 (3) MG/3ML IN SOLN
3.0000 mL | Freq: Three times a day (TID) | RESPIRATORY_TRACT | Status: DC
  Administered 2023-09-09 – 2023-09-14 (×15): 3 mL via RESPIRATORY_TRACT
  Filled 2023-09-08 (×16): qty 3

## 2023-09-08 MED ORDER — AMLODIPINE BESYLATE 5 MG PO TABS
10.0000 mg | ORAL_TABLET | Freq: Every day | ORAL | Status: DC
  Administered 2023-09-09 – 2023-09-15 (×6): 10 mg via ORAL
  Filled 2023-09-08 (×6): qty 2

## 2023-09-08 MED ORDER — IPRATROPIUM-ALBUTEROL 0.5-2.5 (3) MG/3ML IN SOLN
3.0000 mL | Freq: Once | RESPIRATORY_TRACT | Status: AC
  Administered 2023-09-08: 3 mL via RESPIRATORY_TRACT
  Filled 2023-09-08: qty 3

## 2023-09-08 MED ORDER — SEVELAMER CARBONATE 800 MG PO TABS
2400.0000 mg | ORAL_TABLET | Freq: Three times a day (TID) | ORAL | Status: DC
  Administered 2023-09-09 – 2023-09-15 (×18): 2400 mg via ORAL
  Filled 2023-09-08 (×18): qty 3

## 2023-09-08 MED ORDER — SODIUM CHLORIDE 0.9 % IV SOLN
1.0000 g | Freq: Once | INTRAVENOUS | Status: AC
  Administered 2023-09-08: 1 g via INTRAVENOUS
  Filled 2023-09-08: qty 10

## 2023-09-08 MED ORDER — ALBUTEROL SULFATE (2.5 MG/3ML) 0.083% IN NEBU
2.5000 mg | INHALATION_SOLUTION | RESPIRATORY_TRACT | Status: DC | PRN
  Administered 2023-09-10 – 2023-09-12 (×2): 2.5 mg via RESPIRATORY_TRACT
  Filled 2023-09-08 (×2): qty 3

## 2023-09-08 MED ORDER — GUAIFENESIN ER 600 MG PO TB12
600.0000 mg | ORAL_TABLET | Freq: Two times a day (BID) | ORAL | Status: DC
  Administered 2023-09-08 – 2023-09-15 (×14): 600 mg via ORAL
  Filled 2023-09-08 (×14): qty 1

## 2023-09-08 MED ORDER — ACETAMINOPHEN 325 MG PO TABS
650.0000 mg | ORAL_TABLET | Freq: Four times a day (QID) | ORAL | Status: DC | PRN
  Administered 2023-09-10 – 2023-09-12 (×2): 650 mg via ORAL
  Filled 2023-09-08 (×2): qty 2

## 2023-09-08 MED ORDER — SODIUM CHLORIDE 3 % IN NEBU: 4.0000 mL | INHALATION_SOLUTION | RESPIRATORY_TRACT | Status: AC | PRN

## 2023-09-08 MED ORDER — HEPARIN SODIUM (PORCINE) 5000 UNIT/ML IJ SOLN
5000.0000 [IU] | Freq: Three times a day (TID) | INTRAMUSCULAR | Status: DC
  Administered 2023-09-08 – 2023-09-15 (×20): 5000 [IU] via SUBCUTANEOUS
  Filled 2023-09-08 (×19): qty 1

## 2023-09-08 MED ORDER — SODIUM CHLORIDE 0.9 % IV SOLN
100.0000 mg | Freq: Two times a day (BID) | INTRAVENOUS | Status: DC
  Administered 2023-09-08 – 2023-09-13 (×10): 100 mg via INTRAVENOUS
  Filled 2023-09-08 (×14): qty 100

## 2023-09-08 MED ORDER — SODIUM CHLORIDE 0.9 % IV SOLN
2.0000 g | INTRAVENOUS | Status: DC
  Administered 2023-09-09 – 2023-09-12 (×4): 2 g via INTRAVENOUS
  Filled 2023-09-08 (×6): qty 20

## 2023-09-08 MED ORDER — FERROUS SULFATE 325 (65 FE) MG PO TABS
325.0000 mg | ORAL_TABLET | ORAL | Status: DC
  Administered 2023-09-09 – 2023-09-14 (×3): 325 mg via ORAL
  Filled 2023-09-08 (×3): qty 1

## 2023-09-08 MED ORDER — PANTOPRAZOLE SODIUM 40 MG PO TBEC
40.0000 mg | DELAYED_RELEASE_TABLET | Freq: Two times a day (BID) | ORAL | Status: DC
  Administered 2023-09-08 – 2023-09-15 (×14): 40 mg via ORAL
  Filled 2023-09-08 (×14): qty 1

## 2023-09-08 MED ORDER — METHYLPREDNISOLONE SODIUM SUCC 125 MG IJ SOLR
80.0000 mg | Freq: Two times a day (BID) | INTRAMUSCULAR | Status: DC
  Administered 2023-09-08 – 2023-09-10 (×4): 80 mg via INTRAVENOUS
  Filled 2023-09-08 (×4): qty 2

## 2023-09-08 MED ORDER — PANTOPRAZOLE SODIUM 40 MG PO TBEC: 40.0000 mg | DELAYED_RELEASE_TABLET | Freq: Every day | ORAL | Status: DC

## 2023-09-08 MED ORDER — RENA-VITE PO TABS
1.0000 | ORAL_TABLET | Freq: Every day | ORAL | Status: DC
  Administered 2023-09-08 – 2023-09-14 (×7): 1 via ORAL
  Filled 2023-09-08 (×7): qty 1

## 2023-09-08 NOTE — ED Notes (Signed)
 Pt A&Ox4 at this time. Sated, "I just want some food." Primary paramedic notified.

## 2023-09-08 NOTE — ED Triage Notes (Signed)
 Pt c/o shob since yesterday, had some dialysis today then they stopped due to pt c/o shob.pt arrived a/o. No resp distress or shob noted. Pt c/o PNA and covid recently. C/o cough.

## 2023-09-08 NOTE — H&P (Signed)
 [facility] H&P   Patient Demographics:    Sheri Cooper, is a 73 y.o. female  MRN: [id number]   DOB - 1950-09-15  Admit Date - 09/08/2023  Outpatient Primary MD for the patient is Leesa Pulling, MD  Referring MD/NP/PA: PA Rigney  Patient coming from: HD center  Chief Complaint  Patient presents with   Shortness of Breath      HPI:    Sheri Cooper  is a 73 y.o. 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 - [name] was sent by her hemodialysis center for shortness of breath, patient reports dyspnea since yesterday, cough, congestion, reports he has congestion but unable to produce phlegm, she denies any vomiting, nausea, patient reports she was diagnosed with COVID-19 2 weeks ago, all COVID symptoms has resolved, patient reports she was treated for pneumonia x 4 over the last year, but most recent was December 2020 for, during dialysis she was noted to be dyspneic so she only received 20 minutes and sent to ED for further evaluation. - In ED saturation was 85%, started on 2 L nasal cannula, chest x-ray significant for left pneumonia, some wheezing initially, started on antibiotics, [facility] hospitalist consulted to admit.     Review of systems:      A full 10 point Review of Systems was done, except as stated above, all other Review of Systems were negative.   With Past History of the following :    Past Medical History:  Diagnosis Date   Anemia    Chronic kidney disease (CKD), stage IV (severe) (HCC)    COPD (chronic obstructive pulmonary disease) (HCC)    ESRD (end stage renal disease) (HCC)    Hemodialysis T, TH, SAT, [address] , [place]   GERD (gastroesophageal reflux disease)    History of blood transfusion    History of blood transfusion    Hypertension    Pneumonia    3 times in 2024-   PUD (peptic ulcer disease) 05/2022   Rheumatoid  arthritis (HCC)    Rheumatoid arthritis (HCC)       Past Surgical History:  Procedure Laterality Date   A/V SHUNTOGRAM N/A 05/02/2023   Procedure: A/V SHUNTOGRAM;  Surgeon: Philipp Brawn, MD;  Location: [facility] INVASIVE CV LAB;  Service: Cardiovascular;  Laterality: N/A;   AV FISTULA PLACEMENT Left 11/29/2022   Procedure: INSERTION OF LEFT UPPER  ARTERIOVENOUS (AV) GORE-TEX GRAFT ARM;  Surgeon: Kayla Part, MD;  Location: [facility] OR;  Service: Vascular;  Laterality: Left;   CENTRAL VENOUS CATHETER INSERTION Left 05/27/2022   Procedure: INSERTION CENTRAL LINE ADULT;  Surgeon: Awilda Bogus, MD;  Location: [facility] ORS;  Service: General;  Laterality: Left;   COLONOSCOPY WITH PROPOFOL  N/A 05/22/2022   Surgeon: Umberto Ganong, Bearl Limes, MD; blood in the terminal ileum and colon.   ESOPHAGOGASTRODUODENOSCOPY (EGD) WITH PROPOFOL  N/A  05/22/2022   Surgeon: Umberto Ganong, Bearl Limes, MD; peptic ulcer disease with 2 nonbleeding cratered gastric ulcers, multiple duodenal ulcers, 2 of which had adherent clots that were removed and revealed 2 vessels.  1 vessel was clipped, cauterized, and treated with epinephrine  injection.  Second vessel was clipped and treated with cautery, clip fell off.   IR FLUORO GUIDE CV LINE RIGHT  05/24/2022   IR US  GUIDE VASC ACCESS RIGHT  05/24/2022   LAPAROTOMY N/A 05/27/2022   Procedure: EXPLORATORY LAPAROTOMY, OMENTAL PATH, DRAIN PLACEMENT;  Surgeon: Awilda Bogus, MD;  Location: [facility] ORS;  Service: General;  Laterality: N/A;   PERIPHERAL VASCULAR BALLOON ANGIOPLASTY  05/02/2023   Procedure: PERIPHERAL VASCULAR BALLOON ANGIOPLASTY;  Surgeon: Philipp Brawn, MD;  Location: [facility] INVASIVE CV LAB;  Service: Cardiovascular;;   PERIPHERAL VASCULAR THROMBECTOMY Left 08/18/2023   Procedure: PERIPHERAL VASCULAR THROMBECTOMY;  Surgeon: Patrick Boor, MD;  Location: [facility] INVASIVE CV LAB;  Service: Cardiovascular;  Laterality: Left;   VENOUS ANGIOPLASTY Left 08/18/2023   Procedure: VENOUS  ANGIOPLASTY;  Surgeon: Patrick Boor, MD;  Location: [facility] INVASIVE CV LAB;  Service: Cardiovascular;  Laterality: Left;  80% Intragraft; 50% Venous Anastomosis   VOLVULUS REDUCTION     colon resection      Social History:     Social History   Tobacco Use   Smoking status: Former    Types: Cigarettes    Passive exposure: Past   Smokeless tobacco: Never  Substance Use Topics   Alcohol use: Not Currently        Family History :    History reviewed. No pertinent family history.   Home Medications:   Prior to Admission medications   Medication Sig Start Date End Date Taking? Authorizing Provider  amLODipine  (NORVASC ) 10 MG tablet Take 10 mg by mouth daily.   Yes [provider]  b complex-vitamin c-folic acid  (NEPHRO-VITE) 0.8 MG TABS tablet Take 1 tablet by mouth at bedtime.   Yes [provider]  ferrous sulfate 325 (65 FE) MG EC tablet Take 325 mg by mouth 3 (three) times a week.   Yes [provider]  hydrALAZINE  (APRESOLINE ) 100 MG tablet Take 100 mg by mouth daily. 07/14/22  Yes [provider]  metoprolol  succinate (TOPROL -XL) 50 MG 24 hr tablet Take 50 mg by mouth daily. 10/15/22  Yes [provider]  pantoprazole  (PROTONIX ) 40 MG tablet Take 1 tablet (40 mg total) by mouth 2 (two) times daily. 06/05/22 09/08/23 Yes Justina Oman, MD  sevelamer  carbonate (RENVELA ) 800 MG tablet Take 2,400 mg by mouth 3 (three) times daily with meals.   Yes [provider]  molnupiravir EUA (LAGEVRIO) 200 MG CAPS capsule Take 4 capsules by mouth 2 (two) times daily.    [provider]     Allergies:    No Known Allergies   Physical Exam:   Vitals  Blood pressure (!) 123/49, pulse 68, temperature 98 F (36.7 C), resp. rate 17, SpO2 95%.   1. General frail elderly  lying in bed in NAD.  2. Normal affect and insight, Not Suicidal or Homicidal, Awake Alert, Oriented X 3.  3. No F.N deficits, ALL C.Nerves Intact, Strength 5/5  all 4 extremities, Sensation intact all 4 extremities, Plantars down going.  4. Ears and Eyes appear Normal, Conjunctivae clear, PERRLA. Moist Oral Mucosa.  5. Supple Neck, No JVD, No cervical lymphadenopathy appriciated, No Carotid Bruits.  6. Symmetrical Chest wall movement, dimished air entry at the bases, with  right lung Rales  7. RRR, No Gallops, Rubs or Murmurs, No Parasternal Heave.  8. Positive Bowel Sounds, Abdomen Soft, No tenderness, No organomegaly appriciated,No rebound -guarding or rigidity.  9.  No Cyanosis, Normal Skin Turgor, No Skin Rash or Bruise.  10. Good muscle tone,  joints appear normal , no effusions, Normal ROM.    Data Review:    CBC Recent Labs  Lab 09/08/23 1222  WBC 12.6*  HGB 8.9*  HCT 27.9*  PLT 462*  MCV 89.4  MCH 28.5  MCHC 31.9  RDW 16.6*  LYMPHSABS 1.1  MONOABS 0.3  EOSABS 0.0  BASOSABS 0.1   ------------------------------------------------------------------------------------------------------------------  Chemistries  Recent Labs  Lab 09/08/23 1222  NA 135  K 3.6  CL 99  CO2 25  GLUCOSE 149*  BUN 21  CREATININE 5.15*  CALCIUM  8.4*  AST 18  ALT 11  ALKPHOS 91  BILITOT 0.5   ------------------------------------------------------------------------------------------------------------------ CrCl cannot be calculated (Unknown ideal weight.). ------------------------------------------------------------------------------------------------------------------ No results for input(s): "TSH", "T4TOTAL", "T3FREE", "THYROIDAB" in the last 72 hours.  Invalid input(s): "FREET3"  Coagulation profile No results for input(s): "INR", "PROTIME" in the last 168 hours. ------------------------------------------------------------------------------------------------------------------- No results for input(s): "DDIMER" in the last 72  hours. -------------------------------------------------------------------------------------------------------------------  Cardiac Enzymes No results for input(s): "CKMB", "TROPONINI", "MYOGLOBIN" in the last 168 hours.  Invalid input(s): "CK" ------------------------------------------------------------------------------------------------------------------    Component Value Date/Time   BNP 1,061.0 (H) 09/08/2023 1222     ---------------------------------------------------------------------------------------------------------------  Urinalysis    Component Value Date/Time   COLORURINE YELLOW 05/28/2022 1005   APPEARANCEUR CLOUDY (A) 05/28/2022 1005   LABSPEC 1.018 05/28/2022 1005   PHURINE 7.0 05/28/2022 1005   GLUCOSEU NEGATIVE 05/28/2022 1005   HGBUR SMALL (A) 05/28/2022 1005   BILIRUBINUR NEGATIVE 05/28/2022 1005   KETONESUR NEGATIVE 05/28/2022 1005   PROTEINUR >=300 (A) 05/28/2022 1005   NITRITE NEGATIVE 05/28/2022 1005   LEUKOCYTESUR MODERATE (A) 05/28/2022 1005    ----------------------------------------------------------------------------------------------------------------   Imaging Results:    DG Chest Port 1 View Result Date: 09/08/2023 CLINICAL DATA:  Shortness of breath. EXAM: PORTABLE CHEST 1 VIEW COMPARISON:  Chest [place]  dated 09/05/2023. FINDINGS: Small bilateral pleural effusions and bibasilar atelectasis or infiltrate, slightly progressed since the prior radiograph. There is cardiomegaly with vascular congestion. No pneumothorax. No acute osseous pathology. IMPRESSION: 1. Small bilateral pleural effusions and bibasilar atelectasis or infiltrate. 2. Cardiomegaly with vascular congestion. Electronically Signed   By: Angus Bark M.D.   On: 09/08/2023 12:33     EKG:  Vent. rate 65 BPM PR interval 118 ms QRS duration 82 ms QT/QTcB 464/483 ms P-R-T axes 85 71 -82 Sinus rhythm Atrial premature complex Borderline short PR interval Borderline  repolarization abnormality  Assessment & Plan:    Principal Problem:   Pneumonia Active Problems:   Chronic obstructive pulmonary disease with (acute) exacerbation (HCC)   Lobar pneumonia (HCC)   ESRD (end stage renal disease) (HCC)   Anemia   Acute respiratory failure with hypoxia Community-acquired pneumonia COPD exacerbation -Resents dyspnea, increased work of breathing, hypoxic 85% on room air requiring 2 L nasal cannula. - He was encouraged use incentive spirometry, flutter valve, she reports this is her first bout of pneumonia in 1 year, I have instructed her we will need some sputum cultures, she was encouraged with flutter valve, will order as needed hypertonic nebs as well. - Check Legionella antigen, strep pneumonia antigen. - Continue with IV Rocephin  and doxycycline   Chronic diastolic CHF - Questionable effusion x-ray, but no overwhelming evidence of overload  on exam, her BNP dated but at baseline. - Only received 20 units of dialysis today, renal consulted to dialyze tomorrow, but again I think her main issue is due to infection not volume overload.  COPD exacerbation - Apparently had some wheezing initially upon presentation, but this appears to be resolved currently, after receiving nebulizer treatment,  continue with as needed albuterol , scheduled duo neb start on IV Solu-Medrol .  ESRD - On hemodialysis TTS schedule, only received 20 minutes today, discussed with renal, they will dialyze tomorrow, no indication for urgent dialysis currently  Hypertension -blood pressures acceptable, continue with home medications   Anemia of chronic kidney disease - Continue with p.o. iron , IV iron 's and IV iron  per renal     DVT Prophylaxis Heparin    AM Labs Ordered, also please review Full Orders  Family Communication: Admission, patients condition and plan of care including tests being ordered have been discussed with the patient who indicate understanding and agree with  the plan and Code Status.  Code Status full  Likely DC to  Full  Consults called: renal    Admission status: inpatient    Time spent in minutes : 70 minutes   Seena Dadds M.D on 09/08/2023 at 4:20 PM   [facility] - Office  [phone]

## 2023-09-08 NOTE — ED Provider Notes (Addendum)
 [place] EMERGENCY DEPARTMENT AT [facility] Provider Note   CSN: [id number] Arrival date & time: 09/08/23  1150     History  Chief Complaint  Patient presents with   Shortness of Breath    Sheri Cooper is a 73 y.o. female.  Patient is a 73 year old female who presents emergency department with a chief complaint of shortness of breath which has been ongoing since yesterday.  Patient notes that she was at dialysis today when she began having worsening shortness of breath and was sent to the emergency department further evaluation.  She did not receive a full course of dialysis.  She denies any increased edema.  She has had no abdominal pain, nausea, vomiting, diarrhea.  She does admit to an ongoing cough and notes that she was recently diagnosed with COVID-19.  She denies any dizziness, lightheadedness or syncope.  She denies any associated chest pain.   Shortness of Breath      Home Medications Prior to Admission medications   Medication Sig Start Date End Date Taking? Authorizing Provider  amLODipine  (NORVASC ) 10 MG tablet Take 10 mg by mouth daily.    [provider]  atorvastatin  (LIPITOR) 40 MG tablet Take 40 mg by mouth at bedtime. 09/22/22   [provider]  b complex-vitamin c-folic acid  (NEPHRO-VITE) 0.8 MG TABS tablet Take 1 tablet by mouth at bedtime.    [provider]  hydrALAZINE  (APRESOLINE ) 100 MG tablet Take 100 mg by mouth daily. 07/14/22   [provider]  metoprolol  succinate (TOPROL -XL) 50 MG 24 hr tablet Take 50 mg by mouth daily. 10/15/22   [provider]  pantoprazole  (PROTONIX ) 40 MG tablet Take 1 tablet (40 mg total) by mouth 2 (two) times daily. 06/05/22 08/17/23  Justina Oman, MD  sevelamer  carbonate (RENVELA ) 800 MG tablet Take 2,400 mg by mouth 3 (three) times daily with meals.    [provider]      Allergies    Patient has no known allergies.    Review of Systems   Review of  Systems  Respiratory:  Positive for shortness of breath.   All other systems reviewed and are negative.   Physical Exam Updated Vital Signs BP (!) 123/49   Pulse 68   Temp 98 F (36.7 C)   Resp 17   SpO2 95%  Physical Exam Vitals and nursing note reviewed.  Constitutional:      Appearance: Normal appearance.  HENT:     Head: Normocephalic and atraumatic.     Nose: Nose normal.     Mouth/Throat:     Mouth: Mucous membranes are moist.  Eyes:     Extraocular Movements: Extraocular movements intact.     Conjunctiva/sclera: Conjunctivae normal.     Pupils: Pupils are equal, round, and reactive to light.  Cardiovascular:     Rate and Rhythm: Normal rate and regular rhythm.     Pulses: Normal pulses.     Heart sounds: Normal heart sounds.  Pulmonary:     Effort: Pulmonary effort is normal. No tachypnea.     Breath sounds: No stridor. Wheezing and rales present. No decreased breath sounds or rhonchi.  Chest:     Chest wall: No tenderness.  Abdominal:     General: Abdomen is flat. Bowel sounds are normal.     Palpations: Abdomen is soft.     Tenderness: There is no abdominal tenderness. There is no guarding.  Musculoskeletal:        General:  Normal range of motion.     Cervical back: Normal range of motion and neck supple.     Right lower leg: No edema.     Left lower leg: No edema.  Skin:    General: Skin is warm and dry.  Neurological:     General: No focal deficit present.     Mental Status: She is alert and oriented to person, place, and time. Mental status is at baseline.  Psychiatric:        Mood and Affect: Mood normal.        Behavior: Behavior normal.        Thought Content: Thought content normal.        Judgment: Judgment normal.     ED Results / Procedures / Treatments   Labs (all labs ordered are listed, but only abnormal results are displayed) Labs Reviewed  CBC WITH DIFFERENTIAL/PLATELET - Abnormal; Notable for the following components:      Result  Value   WBC 12.6 (*)    RBC 3.12 (*)    Hemoglobin 8.9 (*)    HCT 27.9 (*)    RDW 16.6 (*)    Platelets 462 (*)    Neutro Abs 11.1 (*)    Abs Immature Granulocytes 0.08 (*)    All other components within normal limits  RESP PANEL BY RT-PCR (RSV, FLU A&B, COVID)  [id number]  COMPREHENSIVE METABOLIC PANEL WITH GFR  BRAIN NATRIURETIC PEPTIDE  TROPONIN I (HIGH SENSITIVITY)    EKG None  Radiology DG Chest Port 1 View Result Date: 09/08/2023 CLINICAL DATA:  Shortness of breath. EXAM: PORTABLE CHEST 1 VIEW COMPARISON:  Chest [place]  dated 09/05/2023. FINDINGS: Small bilateral pleural effusions and bibasilar atelectasis or infiltrate, slightly progressed since the prior radiograph. There is cardiomegaly with vascular congestion. No pneumothorax. No acute osseous pathology. IMPRESSION: 1. Small bilateral pleural effusions and bibasilar atelectasis or infiltrate. 2. Cardiomegaly with vascular congestion. Electronically Signed   By: Angus Bark M.D.   On: 09/08/2023 12:33    Procedures .Critical Care  Performed by: Roselynn Connors, PA-C Authorized by: Roselynn Connors, PA-C   Critical care provider statement:    Critical care time (minutes):  35   Critical care was necessary to treat or prevent imminent or life-threatening deterioration of the following conditions:  Respiratory failure   Critical care was time spent personally by me on the following activities:  Development of treatment plan with patient or surrogate, discussions with consultants, evaluation of patient's response to treatment, examination of patient, ordering and review of laboratory studies, ordering and review of radiographic studies, ordering and performing treatments and interventions, pulse oximetry, re-evaluation of patient's condition and review of old charts   I assumed direction of critical care for this patient from another provider in my specialty: no     Care discussed with: admitting provider        Medications Ordered in ED Medications  ipratropium-albuterol  (DUONEB) 0.5-2.5 (3) MG/3ML nebulizer solution 3 mL (has no administration in time range)    ED Course/ Medical Decision Making/ A&P                                 Medical Decision Making Amount and/or Complexity of Data Reviewed Labs: ordered. Radiology: ordered.  Risk Prescription drug management. Decision regarding hospitalization.   This patient presents to the ED for concern of shortness of breath, this involves an extensive number of  treatment options, and is a complaint that carries with it a high risk of complications and morbidity.  The differential diagnosis includes acute CHF, chronic renal failure, COPD exacerbation, pneumonia, sepsis   Co morbidities that complicate the patient evaluation  Dialysis dependent, COPD   Additional history obtained:  Additional history obtained from medical records External records from outside source obtained and reviewed including medical records   Lab Tests:  I Ordered, and personally interpreted labs.  The pertinent results include: Leukocytosis, anemia, elevated troponin, elevated BNP, normal electrolytes, elevated creatinine, normal liver function, negative viral swabs   Imaging Studies ordered:  I ordered imaging studies including chest x-ray I independently visualized and interpreted imaging which showed bilateral pleural effusions versus infiltrates, vascular congestion I agree with the radiologist interpretation   Cardiac Monitoring: / EKG:  The patient was maintained on a cardiac monitor.  I personally viewed and interpreted the cardiac monitored which showed an underlying rhythm of: Normal sinus rhythm, no ST changes, nonspecific T wave change, no STEMI   Consultations Obtained:  I requested consultation with the hospitalist,  and discussed lab and imaging findings as well as pertinent plan - they recommend: Admission   Problem List / ED  Course / Critical interventions / Medication management  Patient is doing well at this time and does remain stable.  Discussed with patient we will plan for admission to the hospitalist service for her hypoxic respiratory failure and pneumonia.  Patient has been given antibiotics in the emergency department as well as breathing treatment.  Do not suspect fluid overload at this point and patient does not warrant emergent dialysis.  Vital signs are otherwise stable and patient does not meet sepsis criteria.  Did discuss patient case with Dr. Osborne Blazer with the hospitalist service who has excepted at this time. I ordered medication including Rocephin , doxycycline , DuoNeb for pneumonia, shortness of breath Reevaluation of the patient after these medicines showed that the patient improved I have reviewed the patients home medicines and have made adjustments as needed   Social Determinants of Health:  None   Test / Admission - Considered:  Admission        Final Clinical Impression(s) / ED Diagnoses Final diagnoses:  None    Rx / DC Orders ED Discharge Orders     None         Roselynn Connors, PA-C 09/08/23 1726    Roselynn Connors, PA-C 09/08/23 1726    Edson Graces, MD 09/08/23 2256

## 2023-09-09 DIAGNOSIS — N186 End stage renal disease: Secondary | ICD-10-CM

## 2023-09-09 DIAGNOSIS — J189 Pneumonia, unspecified organism: Secondary | ICD-10-CM | POA: Diagnosis not present

## 2023-09-09 DIAGNOSIS — J441 Chronic obstructive pulmonary disease with (acute) exacerbation: Secondary | ICD-10-CM | POA: Diagnosis not present

## 2023-09-09 LAB — CBC
HCT: 27.4 % — ABNORMAL LOW (ref 36.0–46.0)
Hemoglobin: 9 g/dL — ABNORMAL LOW (ref 12.0–15.0)
MCH: 29.4 pg (ref 26.0–34.0)
MCHC: 32.8 g/dL (ref 30.0–36.0)
MCV: 89.5 fL (ref 80.0–100.0)
Platelets: 449 10*3/uL — ABNORMAL HIGH (ref 150–400)
RBC: 3.06 MIL/uL — ABNORMAL LOW (ref 3.87–5.11)
RDW: 16.7 % — ABNORMAL HIGH (ref 11.5–15.5)
WBC: 9.1 10*3/uL (ref 4.0–10.5)
nRBC: 0 % (ref 0.0–0.2)

## 2023-09-09 LAB — STREP PNEUMONIAE URINARY ANTIGEN: Strep Pneumo Urinary Antigen: NEGATIVE

## 2023-09-09 LAB — BASIC METABOLIC PANEL WITH GFR
Anion gap: 14 (ref 5–15)
BUN: 26 mg/dL — ABNORMAL HIGH (ref 8–23)
CO2: 23 mmol/L (ref 22–32)
Calcium: 8.1 mg/dL — ABNORMAL LOW (ref 8.9–10.3)
Chloride: 103 mmol/L (ref 98–111)
Creatinine, Ser: 5.89 mg/dL — ABNORMAL HIGH (ref 0.44–1.00)
GFR, Estimated: 7 mL/min — ABNORMAL LOW (ref >60)
Glucose, Bld: 126 mg/dL — ABNORMAL HIGH (ref 70–99)
Potassium: 4.2 mmol/L (ref 3.5–5.1)
Sodium: 140 mmol/L (ref 135–145)

## 2023-09-09 MED ORDER — METOCLOPRAMIDE HCL 5 MG/ML IJ SOLN
5.0000 mg | Freq: Four times a day (QID) | INTRAMUSCULAR | Status: DC | PRN
  Filled 2023-09-09: qty 2

## 2023-09-09 MED ORDER — CHLORHEXIDINE GLUCONATE CLOTH 2 % EX PADS
6.0000 | MEDICATED_PAD | Freq: Every day | CUTANEOUS | Status: DC
  Administered 2023-09-09 – 2023-09-15 (×7): 6 via TOPICAL

## 2023-09-09 NOTE — Consult Note (Addendum)
 [facility] Nephrology Consult Note: Reason for Consult: To manage dialysis and dialysis related needs Referring Physician: Dr. Lincoln Renshaw, [place]  HPI:  Sheri Cooper is an 73 y.o. female with past medical history significant for hypertension, COPD, ESRD on HD presented with shortness of breath and wheezing seen as a consultation for the management of ESRD.  The patient was diagnosed with COVID-19 about 2 weeks ago and she has had multiple hospitalization for pneumonia recently.  It seems like she received partial dialysis yesterday outpatient.  In the ER she was hypoxic to 85% therefore required oxygen via nasal cannula.  Chest x-ray reported as left pneumonia and had some wheezing therefore started on empiric antibiotics.  We are consulted to provide dialysis and manage ESRD. Currently patient reports her breathing is much better, using around 2.5 L of oxygen.  The labs showed potassium 4.2, BUN 26, hemoglobin 9.  Chest x-ray with small bilateral pleural effusion and atelectasis or infiltrate.  Some vascular congestion. She denies nausea, vomiting, chest pain.  Able to lie flat comfortable. OP HD orders: D[facility]yzes at  [facility], LUE AVG+   Past Medical History:  Diagnosis Date   Anemia    Chronic kidney disease (CKD), stage IV (severe) (HCC)    COPD (chronic obstructive pulmonary disease) (HCC)    ESRD (end stage renal disease) (HCC)    Hemodialysis T, TH, SAT, [address] , [place]   GERD (gastroesophageal reflux disease)    History of blood transfusion    History of blood transfusion    Hypertension    Pneumonia    3 times in 2024-   PUD (peptic ulcer disease) 05/2022   Rheumatoid arthritis (HCC)    Rheumatoid arthritis (HCC)     Past Surgical History:  Procedure Laterality Date   A/V SHUNTOGRAM N/A 05/02/2023   Procedure: A/V SHUNTOGRAM;  Surgeon: Philipp Brawn, MD;  Location: [facility] INVASIVE CV LAB;  Service: Cardiovascular;  Laterality: N/A;   AV FISTULA  PLACEMENT Left 11/29/2022   Procedure: INSERTION OF LEFT UPPER  ARTERIOVENOUS (AV) GORE-TEX GRAFT ARM;  Surgeon: Kayla Part, MD;  Location[facility] OR;  Service: Vascular;  Laterality: Left;   CENTRAL VENOUS CATHETER INSERTION Left 05/27/2022   Procedure: INSERTION CENTRAL LINE ADULT;  Surgeon: Awilda Bogus, MD;  Location: [facility] ORS;  Service: General;  Laterality: Left;   COLONOSCOPY WITH PROPOFOL  N/A 05/22/2022   Surgeon: Urban Garden, MD; blood in the terminal ileum and colon.   ESOPHAGOGASTRODUODENOSCOPY (EGD) WITH PROPOFOL  N/A 05/22/2022   Surgeon: Umberto Ganong, Bearl Limes, MD; peptic ulcer disease with 2 nonbleeding cratered gastric ulcers, multiple duodenal ulcers, 2 of which had adherent clots that were removed and revealed 2 vessels.  1 vessel was clipped, cauterized, and treated with epinephrine  injection.  Second vessel was clipped and treated with cautery, clip fell off.   IR FLUORO GUIDE CV LINE RIGHT  05/24/2022   IR US  GUIDE VASC ACCESS RIGHT  05/24/2022   LAPAROTOMY N/A 05/27/2022   Procedure: EXPLORATORY LAPAROTOMY, OMENTAL PATH, DRAIN PLACEMENT;  Surgeon: Awilda Bogus, MD;  Location: [facility] ORS;  Service: General;  Laterality: N/A;   PERIPHERAL VASCULAR BALLOON ANGIOPLASTY  05/02/2023   Procedure: PERIPHERAL VASCULAR BALLOON ANGIOPLASTY;  Surgeon: Philipp Brawn, MD;  Location: [facility] INVASIVE CV LAB;  Service: Cardiovascular;;   PERIPHERAL VASCULAR THROMBECTOMY Left 08/18/2023   Procedure: PERIPHERAL VASCULAR THROMBECTOMY;  Surgeon: Patrick Boor, MD;  Location: [facility] INVASIVE CV LAB;  Service: Cardiovascular;  Laterality: Left;  VENOUS ANGIOPLASTY Left 08/18/2023   Procedure: VENOUS ANGIOPLASTY;  Surgeon: Patrick Boor, MD;  Location: [facility] INVASIVE CV LAB;  Service: Cardiovascular;  Laterality: Left;  80% Intragraft; 50% Venous Anastomosis   VOLVULUS REDUCTION     colon resection    History reviewed. No pertinent family history.  Social History:  reports that she  has quit smoking. Her smoking use included cigarettes. She has been exposed to tobacco smoke. She has never used smokeless tobacco. She reports that she does not currently use alcohol. She reports that she does not use drugs.  Allergies: No Known Allergies  Medications: I have reviewed the patient's current medications.   Results for orders placed or performed during the hospital encounter of 09/08/23 (from the past 48 hours)  Comprehensive metabolic panel     Status: Abnormal   Collection Time: 09/08/23 12:22 PM  Result Value Ref Range   Sodium 135 135 - 145 mmol/L   Potassium 3.6 3.5 - 5.1 mmol/L   Chloride 99 98 - 111 mmol/L   CO2 25 22 - 32 mmol/L   Glucose, Bld 149 (H) 70 - 99 mg/dL    Comment: Glucose reference range applies only to samples taken after fasting for at least 8 hours.   BUN 21 8 - 23 mg/dL   Creatinine, Ser 1.61 (H) 0.44 - 1.00 mg/dL   Calcium  8.4 (L) 8.9 - 10.3 mg/dL   Total Protein 6.4 (L) 6.5 - 8.1 g/dL   Albumin  2.9 (L) 3.5 - 5.0 g/dL   AST 18 15 - 41 U/L   ALT 11 0 - 44 U/L   Alkaline Phosphatase 91 38 - 126 U/L   Total Bilirubin 0.5 0.0 - 1.2 mg/dL   GFR, Estimated 8 (L) >60 mL/min    Comment: (NOTE) Calculated using the CKD-EPI Creatinine Equation (2021)    Anion gap 11 5 - 15    Comment: Performed at [facility], [address], [place], [place] [address]  Brain natriuretic peptide     Status: Abnormal   Collection Time: 09/08/23 12:22 PM  Result Value Ref Range   B Natriuretic Peptide 1,061.0 (H) 0.0 - 100.0 pg/mL    Comment: Performed at [facility], [address] [place], [place] [address]  Troponin I (High Sensitivity)     Status: Abnormal   Collection Time: 09/08/23 12:22 PM  Result Value Ref Range   Troponin I (High Sensitivity) 41 (H) <18 ng/L    Comment: (NOTE) Elevated high sensitivity troponin I (hsTnI) values and significant  changes across serial measurements may suggest ACS but many other  chronic and acute conditions are known to  elevate hsTnI results.  Refer to the "Links" section for chest pain algorithms and additional  guidance. Performed at [facility], [address], [place], [place] [address]   CBC with Differential     Status: Abnormal   Collection Time: 09/08/23 12:22 PM  Result Value Ref Range   WBC 12.6 (H) 4.0 - 10.5 K/uL   RBC 3.12 (L) 3.87 - 5.11 MIL/uL   Hemoglobin 8.9 (L) 12.0 - 15.0 g/dL   HCT 78.2 (L) 95.6 - 21.3 %   MCV 89.4 80.0 - 100.0 fL   MCH 28.5 26.0 - 34.0 pg   MCHC 31.9 30.0 - 36.0 g/dL   RDW 08.6 (H) 57.8 - 46.9 %   Platelets 462 (H) 150 - 400 K/uL   nRBC 0.0 0.0 - 0.2 %   Neutrophils Relative % 87 %   Neutro  Abs 11.1 (H) 1.7 - 7.7 K/uL   Lymphocytes Relative 8 %   Lymphs Abs 1.1 0.7 - 4.0 K/uL   Monocytes Relative 3 %   Monocytes Absolute 0.3 0.1 - 1.0 K/uL   Eosinophils Relative 0 %   Eosinophils Absolute 0.0 0.0 - 0.5 K/uL   Basophils Relative 1 %   Basophils Absolute 0.1 0.0 - 0.1 K/uL   Immature Granulocytes 1 %   Abs Immature Granulocytes 0.08 (H) 0.00 - 0.07 K/uL    Comment: Performed at [facility], [address] [place], [place] [address]  Resp panel by RT-PCR (RSV, Flu A&B, Covid) Anterior Nasal Swab     Status: None   Collection Time: 09/08/23 12:24 PM   Specimen: Anterior Nasal Swab  Result Value Ref Range   SARS Coronavirus 2 by RT PCR NEGATIVE NEGATIVE    Comment: (NOTE) SARS-CoV-2 target nucleic acids are NOT DETECTED.  The SARS-CoV-2 RNA is generally detectable in upper respiratory specimens during the acute phase of infection. The lowest concentration of SARS-CoV-2 viral copies this assay can detect is 138 copies/mL. A negative result does not preclude SARS-Cov-2 infection and should not be used as the sole basis for treatment or other patient management decisions. A negative result may occur with  improper specimen collection/handling, submission of specimen other than nasopharyngeal swab, presence of viral mutation(s) within the areas targeted by  this assay, and inadequate number of viral copies(<138 copies/mL). A negative result must be combined with clinical observations, patient history, and epidemiological information. The expected result is Negative.  Fact Sheet for Patients:  BloggerCourse.com  Fact Sheet for Healthcare Providers:  SeriousBroker.it  This test is no t yet approved or cleared by the [place]  FDA and  has been authorized for detection and/or diagnosis of SARS-CoV-2 by FDA under an Emergency Use Authorization (EUA). This EUA will remain  in effect (meaning this test can be used) for the duration of the COVID-19 declaration under Section 564(b)(1) of the Act, 21 U.S.C.section 360bbb-3(b)(1), unless the authorization is terminated  or revoked sooner.       Influenza A by PCR NEGATIVE NEGATIVE   Influenza B by PCR NEGATIVE NEGATIVE    Comment: (NOTE) The Xpert Xpress SARS-CoV-2/FLU/RSV plus assay is intended as an aid in the diagnosis of influenza from Nasopharyngeal swab specimens and should not be used as a sole basis for treatment. Nasal washings and aspirates are unacceptable for Xpert Xpress SARS-CoV-2/FLU/RSV testing.  Fact Sheet for Patients: BloggerCourse.com  Fact Sheet for Healthcare Providers: SeriousBroker.it  This test is not yet approved or cleared by the [place]  FDA and has been authorized for detection and/or diagnosis of SARS-CoV-2 by FDA under an Emergency Use Authorization (EUA). This EUA will remain in effect (meaning this test can be used) for the duration of the COVID-19 declaration under Section 564(b)(1) of the Act, 21 U.S.C. section 360bbb-3(b)(1), unless the authorization is terminated or revoked.     Resp Syncytial Virus by PCR NEGATIVE NEGATIVE    Comment: (NOTE) Fact Sheet for Patients: BloggerCourse.com  Fact Sheet for Healthcare  Providers: SeriousBroker.it  This test is not yet approved or cleared by the [place]  FDA and has been authorized for detection and/or diagnosis of SARS-CoV-2 by FDA under an Emergency Use Authorization (EUA). This EUA will remain in effect (meaning this test can be used) for the duration of the COVID-19 declaration under Section 564(b)(1) of the Act, 21 U.S.C. section 360bbb-3(b)(1), unless the authorization is terminated or revoked.  Performed at [facility], [address], [place], [place] [address]   Troponin I (High Sensitivity)     Status: Abnormal   Collection Time: 09/08/23  2:47 PM  Result Value Ref Range   Troponin I (High Sensitivity) 41 (H) <18 ng/L    Comment: (NOTE) Elevated high sensitivity troponin I (hsTnI) values and significant  changes across serial measurements may suggest ACS but many other  chronic and acute conditions are known to elevate hsTnI results.  Refer to the "Links" section for chest pain algorithms and additional  guidance. Performed at [facility], [address] [place], [place] [address]   Basic metabolic panel     Status: Abnormal   Collection Time: 09/09/23  3:44 AM  Result Value Ref Range   Sodium 140 135 - 145 mmol/L   Potassium 4.2 3.5 - 5.1 mmol/L   Chloride 103 98 - 111 mmol/L   CO2 23 22 - 32 mmol/L   Glucose, Bld 126 (H) 70 - 99 mg/dL    Comment: Glucose reference range applies only to samples taken after fasting for at least 8 hours.   BUN 26 (H) 8 - 23 mg/dL   Creatinine, Ser 0.98 (H) 0.44 - 1.00 mg/dL   Calcium  8.1 (L) 8.9 - 10.3 mg/dL   GFR, Estimated 7 (L) >60 mL/min    Comment: (NOTE) Calculated using the CKD-EPI Creatinine Equation (2021)    Anion gap 14 5 - 15    Comment: Performed at [facility], [address] [place], [place] [address]  CBC     Status: Abnormal   Collection Time: 09/09/23  3:44 AM  Result Value Ref Range   WBC 9.1 4.0 - 10.5 K/uL   RBC 3.06 (L) 3.87 - 5.11 MIL/uL    Hemoglobin 9.0 (L) 12.0 - 15.0 g/dL   HCT 78.2 (L) 95.6 - 21.3 %   MCV 89.5 80.0 - 100.0 fL   MCH 29.4 26.0 - 34.0 pg   MCHC 32.8 30.0 - 36.0 g/dL   RDW 08.6 (H) 57.8 - 46.9 %   Platelets 449 (H) 150 - 400 K/uL   nRBC 0.0 0.0 - 0.2 %    Comment: Performed at [facility], [address] [place], [place] [address]    DG Chest Port 1 View Result Date: 09/08/2023 CLINICAL DATA:  Shortness of breath. EXAM: PORTABLE CHEST 1 VIEW COMPARISON:  Chest [place]  dated 09/05/2023. FINDINGS: Small bilateral pleural effusions and bibasilar atelectasis or infiltrate, slightly progressed since the prior radiograph. There is cardiomegaly with vascular congestion. No pneumothorax. No acute osseous pathology. IMPRESSION: 1. Small bilateral pleural effusions and bibasilar atelectasis or infiltrate. 2. Cardiomegaly with vascular congestion. Electronically Signed   By: Angus Bark M.D.   On: 09/08/2023 12:33    ROS: As per H&P, rest of the systems reviewed and negative. Blood pressure (!) 159/46, pulse 71, temperature 98.5 F (36.9 C), resp. rate 20, height 4\' 11"  (1.499 m), weight 43.1 kg, SpO2 93%. Gen: NAD, comfortable Respiratory: Clear bilateral, no wheezing or crackle Cardiovascular: Regular rate rhythm S1-S2 normal, no rubs GI: Abdomen soft, nontender, nondistended Extremities, no cyanosis or clubbing, no edema Skin: No rash or ulcer Neurology: Alert, awake, following commands, oriented Dialysis Access: Left upper extremity AV graft has good thrill and bruit  Assessment/Plan:  # Shortness of breath/pneumonia/COPD/acute hypoxia: Currently on empiric antibiotics and oxygen.  Clinically improving and looks comfortable.  # ESRD: TTS, received partial dialysis yesterday.  No urgent need for HD today and patient  does not want back-to-back dialysis.  We will do HD tomorrow.  # Hypertension: Resume home medication, restrict fluid intake.  UF with HD.  # Anemia of ESRD: Monitor anemia,  erythropoietin as outpatient.  # Metabolic Bone Disease: On sevelamer .  Discussed with the primary team and dialysis nurse.  Please contact on-call nephrologist over the weekend with any question or concern.  Rhia Blatchford [place] Planas 09/09/2023, 10:46 AM

## 2023-09-09 NOTE — TOC Initial Note (Signed)
 Transition of Care [facility]) - Initial/Assessment Note    Patient Details  Name: Sheri Cooper MRN: [id number] Date of Birth: July 04, 1950  Transition of Care [facility]) CM/SW Contact:    Linnea Richards, LCSW Phone Number: 09/09/2023, 10:04 AM  Clinical Narrative:                  Pt admitted from home. She has a high readmission risk score. Met with pt at bedside to assess.  Pt reports that she lives alone in her apartment. She had been staying with her brother at his home up until about a month ago. Pt states she is independent in ADLs. She has a cane and she uses a walker when she goes to HD. Pt states she is able to get to appointments and obtain medications.  Pt [facility] HD [facility]. She rides [employer]. Pt anticipating that she will return home with no needs at dc. TOC will follow and assist if needs arise.  Expected Discharge Plan: Home/Self Care Barriers to Discharge: Continued Medical Work up   Patient Goals and CMS Choice Patient states their goals for this hospitalization and ongoing recovery are:: return home          Expected Discharge Plan and Services In-house Referral: Clinical Social Work     Living arrangements for the past 2 months: Apartment                                      Prior Living Arrangements/Services Living arrangements for the past 2 months: Apartment Lives with:: Self Patient language and need for interpreter reviewed:: Yes Do you feel safe going back to the place where you live?: Yes      Need for Family Participation in Patient Care: No (Comment)     Criminal Activity/Legal Involvement Pertinent to Current Situation/Hospitalization: No - Comment as needed  Activities of Daily Living   ADL Screening (condition at time of admission) Independently performs ADLs?: No Does the patient have a NEW difficulty with bathing/dressing/toileting/self-feeding that is expected to last >3 days?: No Does the patient have a NEW  difficulty with getting in/out of bed, walking, or climbing stairs that is expected to last >3 days?: No Does the patient have a NEW difficulty with communication that is expected to last >3 days?: No Is the patient deaf or have difficulty hearing?: No Does the patient have difficulty seeing, even when wearing glasses/contacts?: No Does the patient have difficulty concentrating, remembering, or making decisions?: No  Permission Sought/Granted                  Emotional Assessment Appearance:: Appears stated age Attitude/Demeanor/Rapport: Engaged Affect (typically observed): Pleasant Orientation: : Oriented to Self, Oriented to Place, Oriented to  Time, Oriented to Situation Alcohol / Substance Use: Not Applicable Psych Involvement: No (comment)  Admission diagnosis:  Pneumonia [J18.9] Hypoxia [R09.02] Pneumonia due to infectious organism, unspecified laterality, unspecified part of lung [J18.9] Chronic kidney disease, unspecified CKD stage [N18.9] Patient Active Problem List   Diagnosis Date Noted   Pneumonia 09/08/2023   Fluid overload 01/25/2023   COPD with acute exacerbation (HCC) 01/25/2023   Primary localized osteoarthrosis of multiple sites 09/14/2022   Weight loss 09/14/2022   Acute respiratory failure with hypoxia (HCC) 09/07/2022   Lobar pneumonia (HCC) 09/07/2022   ESRD (end stage renal disease) (HCC) 09/07/2022   Sepsis due to undetermined organism (HCC) 05/30/2022  Pneumoperitoneum 05/28/2022   Duodenal perforation (HCC) 05/28/2022   Peritonitis (HCC) 05/28/2022   Acute upper GI bleed 05/24/2022   Multiple duodenal ulcers 05/24/2022   ABLA (acute blood loss anemia) 05/24/2022   Melena 05/22/2022   HCAP (healthcare-associated pneumonia) 05/16/2022   Hypothermia 05/16/2022   Acute renal failure superimposed on stage 3b chronic kidney disease (HCC) 05/16/2022   Elevated brain natriuretic peptide (BNP) level 05/16/2022   Elevated troponin 05/16/2022    Increased anion gap metabolic acidosis 05/16/2022   Prolonged QT interval 05/16/2022   Hypoalbuminemia due to protein-calorie malnutrition (HCC) 05/16/2022   Iron  deficiency anemia 05/16/2022   CKD (chronic kidney disease), stage V (HCC) 05/16/2022   Renal mass 05/14/2022   Simple renal cyst 05/14/2022   Vitamin D deficiency 05/14/2022   Chronic obstructive pulmonary disease with (acute) exacerbation (HCC) 05/07/2022   Pneumonia due to infectious organism 05/07/2022   Acute nontraumatic kidney injury (HCC) 05/07/2022   Mixed hyperlipidemia 05/07/2022   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   Primary hypertension 05/07/2022   Rheumatoid arthritis involving both hands with negative rheumatoid factor (HCC) 05/07/2022   Medication therapy management recommendation declined by patient 07/28/2020   Encounter for monitoring aromatase inhibitor therapy 06/12/2020   History of radiation therapy 06/12/2020   Involvement of right lateral surgical margin by neoplasm 04/16/2020   Status post breast lumpectomy 04/16/2020   Coordination of complex care 01/23/2020   Osteopenia of multiple sites 01/23/2020   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 - [place], [place] - [address] [place] [address] Phone: [phone] Fax: [phone]     Social Drivers of Health (SDOH) Social History: SDOH Screenings   Food Insecurity: No Food Insecurity (09/08/2023)  Housing: Low Risk  (09/08/2023)  Transportation Needs: No Transportation Needs (09/08/2023)  Utilities: Not At Risk (09/08/2023)  Alcohol Screen: Low Risk  (03/07/2023)   Depression (PHQ2-9): Low Risk  (03/07/2023)  Financial Resource Strain: Low Risk  (03/07/2023)  Physical Activity: Inactive (03/07/2023)  Social Connections: Unknown (09/08/2023)  Stress: No Stress Concern Present (03/07/2023)  Tobacco Use: Medium Risk (09/08/2023)  Health Literacy: Adequate Health Literacy (03/07/2023)   SDOH Interventions:     Readmission Risk Interventions    09/09/2023   10:03 AM 01/27/2023   10:19 AM 05/18/2022   11:51 AM  Readmission Risk Prevention Plan  Transportation Screening Complete Complete Complete  PCP or Specialist Appt within 5-7 Days   Not Complete  Home Care Screening   Complete  Medication Review (RN CM)   Complete  HRI or Home Care Consult Complete    Social Work Consult for Recovery Care Planning/Counseling Complete    Palliative Care Screening Not Applicable    Medication Review Oceanographer) Complete Complete   HRI or Home Care Consult  Complete   SW Recovery Care/Counseling Consult  Complete   Palliative Care Screening  Not Applicable   Skilled Nursing Facility  Not Applicable

## 2023-09-09 NOTE — Progress Notes (Signed)
 The beneficiary is confined to a single room and will need a 3N1 at discharge.

## 2023-09-09 NOTE — Evaluation (Signed)
 Physical Therapy Evaluation Patient Details Name: Sheri Cooper MRN: [id number] DOB: Nov 06, 1950 Today's Date: 09/09/2023  History of Present Illness  Sheri Cooper  is a 73 y.o. 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  - [name] was sent by her hemodialysis center for shortness of breath, patient reports dyspnea since yesterday, cough, congestion, reports he has congestion but unable to produce phlegm, she denies any vomiting, nausea, patient reports she was diagnosed with COVID-19 2 weeks ago, all COVID symptoms has resolved, patient reports she was treated for pneumonia x 4 over the last year, but most recent was December 2020 for, during dialysis she was noted to be dyspneic so she only received 20 minutes and sent to ED for further evaluation.  - In ED saturation was 85%, started on 2 L nasal cannula, chest x-ray significant for left pneumonia, some wheezing initially, started on antibiotics, [facility] hospitalist consulted to admit.   Clinical Impression  Patient functioning at baseline for functional mobility and gait demonstrating good return for ambulating in hallway without loss of balance or need for an AD, on room air with SpO2 at 94% - nurse notified.  Plan:  Patient discharged from physical therapy to care of nursing for ambulation daily as tolerated for length of stay.          If plan is discharge home, recommend the following: Help with stairs or ramp for entrance   Can travel by private vehicle        Equipment Recommendations BSC/3in1  Recommendations for Other Services       Functional Status Assessment Patient has not had a recent decline in their functional status     Precautions / Restrictions Precautions Precautions: None Restrictions Weight Bearing Restrictions Per Provider Order: No      Mobility  Bed Mobility Overal bed mobility: Independent                  Transfers Overall transfer level: Independent                       Ambulation/Gait Ambulation/Gait assistance: Modified independent (Device/Increase time) Gait Distance (Feet): 100 Feet Assistive device: None Gait Pattern/deviations: WFL(Within Functional Limits) Gait velocity: slightly decreased     General Gait Details: grossly WFL with good return for ambulating in room, hallways without loss of balance while on room air with SpO2 at 94%  Stairs            Wheelchair Mobility     Tilt Bed    Modified Rankin (Stroke Patients Only)       Balance Overall balance assessment: Independent                                           Pertinent Vitals/Pain Pain Assessment Pain Assessment: No/denies pain    Home Living Family/patient expects to be discharged to:: Private residence Living Arrangements: Alone Available Help at Discharge: Family;Available PRN/intermittently Type of Home: Apartment Home Access: Level entry       Home Layout: One level Home Equipment: Cane - single Librarian, academic (2 wheels)      Prior Function Prior Level of Function : Independent/Modified Independent;Driving             Mobility Comments: Community ambulation with RW PRN ADLs Comments: Independent     Extremity/Trunk Assessment  Upper Extremity Assessment Upper Extremity Assessment: Overall WFL for tasks assessed    Lower Extremity Assessment Lower Extremity Assessment: Overall WFL for tasks assessed       Communication   Communication Communication: No apparent difficulties    Cognition Arousal: Alert Behavior During Therapy: WFL for tasks assessed/performed   PT - Cognitive impairments: No apparent impairments                         Following commands: Intact       Cueing Cueing Techniques: Verbal cues     General Comments      Exercises     Assessment/Plan    PT Assessment Patient does not need any further PT services  PT Problem List         PT  Treatment Interventions      PT Goals (Current goals can be found in the Care Plan section)  Acute Rehab PT Goals Patient Stated Goal: return home with family to assist PT Goal Formulation: With patient Time For Goal Achievement: 09/09/23 Potential to Achieve Goals: Good    Frequency       Co-evaluation               AM-PAC PT "6 Clicks" Mobility  Outcome Measure Help needed turning from your back to your side while in a flat bed without using bedrails?: None Help needed moving from lying on your back to sitting on the side of a flat bed without using bedrails?: None Help needed moving to and from a bed to a chair (including a wheelchair)?: None Help needed standing up from a chair using your arms (e.g., wheelchair or bedside chair)?: None Help needed to walk in hospital room?: None Help needed climbing 3-5 steps with a railing? : A Little 6 Click Score: 23    End of Session   Activity Tolerance: Patient tolerated treatment well Patient left: in chair;with call bell/phone within reach Nurse Communication: Mobility status PT Visit Diagnosis: Unsteadiness on feet (R26.81);Other abnormalities of gait and mobility (R26.89);Muscle weakness (generalized) (M62.81)    Time: 0347-4259 PT Time Calculation (min) (ACUTE ONLY): 17 min   Charges:   PT Evaluation $PT Eval Low Complexity: 1 Low PT Treatments $Therapeutic Activity: 8-22 mins PT General Charges $$ ACUTE PT VISIT: 1 Visit         3:06 PM, 09/09/23 Walton Guppy, MPT Physical Therapist with [facility] [phone] office [phone] mobile phone

## 2023-09-09 NOTE — Plan of Care (Signed)

## 2023-09-09 NOTE — Plan of Care (Signed)
  Problem: Education: Goal: Knowledge of General Education information will improve Description: Including pain rating scale, medication(s)/side effects and non-pharmacologic comfort measures Outcome: Progressing   Problem: Health Behavior/Discharge Planning: Goal: Ability to manage health-related needs will improve Outcome: Progressing   Problem: Clinical Measurements: Goal: Ability to maintain clinical measurements within normal limits will improve Outcome: Progressing Goal: Will remain free from infection Outcome: Progressing Goal: Diagnostic test results will improve Outcome: Progressing Goal: Respiratory complications will improve Outcome: Progressing Goal: Cardiovascular complication will be avoided Outcome: Progressing   Problem: Nutrition: Goal: Adequate nutrition will be maintained Outcome: Progressing   Problem: Coping: Goal: Level of anxiety will decrease Outcome: Progressing   Problem: Elimination: Goal: Will not experience complications related to bowel motility Outcome: Progressing Goal: Will not experience complications related to urinary retention Outcome: Progressing   Problem: Pain Managment: Goal: General experience of comfort will improve and/or be controlled Outcome: Progressing   Problem: Skin Integrity: Goal: Risk for impaired skin integrity will decrease Outcome: Progressing   Problem: Activity: Goal: Ability to tolerate increased activity will improve Outcome: Progressing   Problem: Clinical Measurements: Goal: Ability to maintain a body temperature in the normal range will improve Outcome: Progressing   Problem: Respiratory: Goal: Ability to maintain adequate ventilation will improve Outcome: Progressing Goal: Ability to maintain a clear airway will improve Outcome: Progressing

## 2023-09-09 NOTE — Hospital Course (Signed)
 73 y.o. 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  Pt was sent by her hemodialysis center for shortness of breath, patient reports dyspnea since yesterday, cough, congestion, reports he has congestion but unable to produce phlegm, she denies any vomiting, nausea, patient reports she was diagnosed with COVID-19 2 weeks ago, all COVID symptoms has resolved, patient reports she was treated for pneumonia x 4 over the last year, but most recent was December 2020 for, during dialysis she was noted to be dyspneic so she only received 20 minutes and sent to ED for further evaluation. - In ED saturation was 85%, started on 2 L nasal cannula, chest x-ray significant for left pneumonia, some wheezing initially, started on antibiotics, [facility] hospitalist consulted to admit.

## 2023-09-09 NOTE — TOC Progression Note (Signed)
 Transition of Care [facility]) - Progression Note    Patient Details  Name: Sheri Cooper MRN: [id number] Date of Birth: [birth year]  Transition of Care [facility]) CM/SW Contact  Ander Kat[place], [place] Phone Number: 09/09/2023, 3:14 PM  Clinical Narrative:  PT recommending 3N1. Discussed with pt who is agreeable and no preference on agency. Referred to Zach with [employer].     Expected Discharge Plan: Home/Self Care Barriers to Discharge: Continued Medical Work up  Expected Discharge Plan and Services In-house Referral: Clinical Social Work     Living arrangements for the past 2 months: Apartment                                       Social Determinants of Health (SDOH) Interventions SDOH Screenings   Food Insecurity: No Food Insecurity (09/08/2023)  Housing: Low Risk  (09/08/2023)  Transportation Needs: No Transportation Needs (09/08/2023)  Utilities: Not At Risk (09/08/2023)  Alcohol Screen: Low Risk  (03/07/2023)  Depression (PHQ2-9): Low Risk  (03/07/2023)  Financial Resource Strain: Low Risk  (03/07/2023)  Physical Activity: Inactive (03/07/2023)  Social Connections: Unknown (09/08/2023)  Stress: No Stress Concern Present (03/07/2023)  Tobacco Use: Medium Risk (09/08/2023)  Health Literacy: Adequate Health Literacy (03/07/2023)    Readmission Risk Interventions    09/09/2023   10:03 AM 01/27/2023   10:19 AM 05/18/2022   11:51 AM  Readmission Risk Prevention Plan  Transportation Screening Complete Complete Complete  PCP or Specialist Appt within 5-7 Days   Not Complete  Home Care Screening   Complete  Medication Review (RN CM)   Complete  HRI or Home Care Consult Complete    Social Work Consult for Recovery Care Planning/Counseling Complete    Palliative Care Screening Not Applicable    Medication Review Oceanographer) Complete Complete   HRI or Home Care Consult  Complete   SW Recovery Care/Counseling Consult  Complete   Palliative Care Screening  Not Applicable    Skilled Nursing Facility  Not Applicable

## 2023-09-09 NOTE — Progress Notes (Signed)
 PROGRESS NOTE   Sheri Cooper  KGM:[id number] DOB: 17-Jun-1950 DOA: 09/08/2023 PCP: Leesa Pulling, MD   Chief Complaint  Patient presents with   Shortness of Breath   Level of care: Telemetry  Brief Admission History:  73 y.o. 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  Pt was sent by her hemodialysis center for shortness of breath, patient reports dyspnea since yesterday, cough, congestion, reports he has congestion but unable to produce phlegm, she denies any vomiting, nausea, patient reports she was diagnosed with COVID-19 2 weeks ago, all COVID symptoms has resolved, patient reports she was treated for pneumonia x 4 over the last year, but most recent was December 2020 for, during dialysis she was noted to be dyspneic so she only received 20 minutes and sent to ED for further evaluation. - In ED saturation was 85%, started on 2 L nasal cannula, chest x-ray significant for left pneumonia, some wheezing initially, started on antibiotics, [facility] hospitalist consulted to admit.     Assessment and Plan:  Acute respiratory failure with hypoxia Community-acquired pneumonia COPD exacerbation -Resents dyspnea, increased work of breathing, hypoxic 85% on room air requiring 2 L nasal cannula. - He was encouraged use incentive spirometry, flutter valve, she reports this is her first bout of pneumonia in 1 year, I have instructed her we will need some sputum cultures, she was encouraged with flutter valve, will order as needed hypertonic nebs as well. - Check Legionella antigen, strep pneumonia antigen. - Continue with IV Rocephin  and doxycycline    Chronic diastolic CHF - Questionable effusion x-ray, but no overwhelming evidence of overload on exam, her BNP dated but at baseline. - HD planned for 09/10/23, I discussed with nephrologist    COPD exacerbation - Apparently had some wheezing initially upon presentation, but this appears to be resolved currently, after  receiving nebulizer treatment,  continue with as needed albuterol , scheduled duo neb start on IV Solu-Medrol .   ESRD - On hemodialysis TTS schedule, discussed with nephrologist, planning HD on 09/10/23    Hypertension - continue with home medications    Anemia of chronic kidney disease - Continue with p.o. iron , IV iron 's and IV iron  per renal   DVT prophylaxis: [place] heparin  Code Status: Full  Family Communication:  Disposition: anticipate home    Consultants:  Nephrologist   Procedures:   Antimicrobials:    Subjective: Pt reports that she is feeling less SOB today and denies palpitations  Objective: Vitals:   09/09/23 0018 09/09/23 0432 09/09/23 0808 09/09/23 0812  BP: (!) 150/45 (!) 154/48 (!) 159/46   Pulse: (!) 59 70 71   Resp: 18 19 20    Temp: 97.6 F (36.4 C) 98.3 F (36.8 C) 98.5 F (36.9 C)   TempSrc: Oral Oral    SpO2: 95% 96% 96% 93%  Weight:      Height:        Intake/Output Summary (Last 24 hours) at 09/09/2023 1445 Last data filed at 09/09/2023 1349 Gross per 24 hour  Intake 1180 ml  Output --  Net 1180 ml   Filed Weights   09/08/23 1742  Weight: 43.1 kg   Examination:  General exam: Appears calm and comfortable  Respiratory system: rales RLL Cardiovascular system: normal S1 & S2 heard. No JVD, murmurs, rubs, gallops or clicks. No pedal edema. Gastrointestinal system: Abdomen is nondistended, soft and nontender. No organomegaly or masses felt. Normal bowel sounds heard. Central nervous system: Alert and oriented. No  focal neurological deficits. Extremities: Symmetric 5 x 5 power. Skin: No rashes, lesions or ulcers. Psychiatry: Judgement and insight appear normal. Mood & affect appropriate.   Data Reviewed: I have personally reviewed following labs and imaging studies  CBC: Recent Labs  Lab 09/08/23 1222 09/09/23 0344  WBC 12.6* 9.1  NEUTROABS 11.1*  --   HGB 8.9* 9.0*  HCT 27.9* 27.4*  MCV 89.4 89.5  PLT 462* 449*    Basic Metabolic  Panel: Recent Labs  Lab 09/08/23 1222 09/09/23 0344  NA 135 140  K 3.6 4.2  CL 99 103  CO2 25 23  GLUCOSE 149* 126*  BUN 21 26*  CREATININE 5.15* 5.89*  CALCIUM  8.4* 8.1*    CBG: No results for input(s): "GLUCAP" in the last 168 hours.  Recent Results (from the past 240 hours)  Resp panel by RT-PCR (RSV, Flu A&B, Covid) Anterior Nasal Swab     Status: None   Collection Time: 09/08/23 12:24 PM   Specimen: Anterior Nasal Swab  Result Value Ref Range Status   SARS Coronavirus 2 by RT PCR NEGATIVE NEGATIVE Final    Comment: (NOTE) SARS-CoV-2 target nucleic acids are NOT DETECTED.  The SARS-CoV-2 RNA is generally detectable in upper respiratory specimens during the acute phase of infection. The lowest concentration of SARS-CoV-2 viral copies this assay can detect is 138 copies/mL. A negative result does not preclude SARS-Cov-2 infection and should not be used as the sole basis for treatment or other patient management decisions. A negative result may occur with  improper specimen collection/handling, submission of specimen other than nasopharyngeal swab, presence of viral mutation(s) within the areas targeted by this assay, and inadequate number of viral copies(<138 copies/mL). A negative result must be combined with clinical observations, patient history, and epidemiological information. The expected result is Negative.  Fact Sheet for Patients:  BloggerCourse.com  Fact Sheet for Healthcare Providers:  SeriousBroker.it  This test is no t yet approved or cleared by the [place]  FDA and  has been authorized for detection and/or diagnosis of SARS-CoV-2 by FDA under an Emergency Use Authorization (EUA). This EUA will remain  in effect (meaning this test can be used) for the duration of the COVID-19 declaration under Section 564(b)(1) of the Act, 21 U.S.C.section 360bbb-3(b)(1), unless the authorization is terminated  or  revoked sooner.       Influenza A by PCR NEGATIVE NEGATIVE Final   Influenza B by PCR NEGATIVE NEGATIVE Final    Comment: (NOTE) The Xpert Xpress SARS-CoV-2/FLU/RSV plus assay is intended as an aid in the diagnosis of influenza from Nasopharyngeal swab specimens and should not be used as a sole basis for treatment. Nasal washings and aspirates are unacceptable for Xpert Xpress SARS-CoV-2/FLU/RSV testing.  Fact Sheet for Patients: BloggerCourse.com  Fact Sheet for Healthcare Providers: SeriousBroker.it  This test is not yet approved or cleared by the [place]  FDA and has been authorized for detection and/or diagnosis of SARS-CoV-2 by FDA under an Emergency Use Authorization (EUA). This EUA will remain in effect (meaning this test can be used) for the duration of the COVID-19 declaration under Section 564(b)(1) of the Act, 21 U.S.C. section 360bbb-3(b)(1), unless the authorization is terminated or revoked.     Resp Syncytial Virus by PCR NEGATIVE NEGATIVE Final    Comment: (NOTE) Fact Sheet for Patients: BloggerCourse.com  Fact Sheet for Healthcare Providers: SeriousBroker.it  This test is not yet approved or cleared by the [place]  FDA and has been authorized for detection and/or  diagnosis of SARS-CoV-2 by FDA under an Emergency Use Authorization (EUA). This EUA will remain in effect (meaning this test can be used) for the duration of the COVID-19 declaration under Section 564(b)(1) of the Act, 21 U.S.C. section 360bbb-3(b)(1), unless the authorization is terminated or revoked.  Performed at [facility], [address], [place], [place] [address]      Radiology Studies: [facility] Chest [facility] 1 View Result Date: 09/08/2023 CLINICAL DATA:  Shortness of breath. EXAM: PORTABLE CHEST 1 VIEW COMPARISON:  Chest [place]  dated 09/05/2023. FINDINGS: Small bilateral pleural  effusions and bibasilar atelectasis or infiltrate, slightly progressed since the prior radiograph. There is cardiomegaly with vascular congestion. No pneumothorax. No acute osseous pathology. IMPRESSION: 1. Small bilateral pleural effusions and bibasilar atelectasis or infiltrate. 2. Cardiomegaly with vascular congestion. Electronically Signed   By: Angus Bark M.D.   On: 09/08/2023 12:33   Scheduled Meds:  amLODipine   10 mg Oral Daily   Chlorhexidine  Gluconate Cloth  6 each Topical Q0600   ferrous sulfate   325 mg Oral Once per day on Monday Wednesday Friday   guaiFENesin   600 mg Oral BID   heparin   5,000 Units Subcutaneous Q8H   hydrALAZINE   100 mg Oral Daily   ipratropium-albuterol   3 mL Nebulization TID   methylPREDNISolone  (SOLU-MEDROL ) injection  80 mg Intravenous Q12H   metoprolol  succinate  50 mg Oral Daily   multivitamin  1 tablet Oral QHS   pantoprazole   40 mg Oral BID   sevelamer  carbonate  2,400 mg Oral TID WC   Continuous Infusions:  cefTRIAXone  (ROCEPHIN )  IV Stopped (09/09/23 0943)   doxycycline  (VIBRAMYCIN ) IV 125 mL/hr at 09/09/23 1349    LOS: 1 day   Time spent: 55 mins  Terrica Duecker Lincoln Renshaw, MD How to contact the [facility] Attending or Consulting provider 7A - 7P or covering provider during after hours 7P -7A, for this patient?  Check the care team in [facility] and look for a) attending/consulting [facility] provider listed and b) the TRH team listed Log into [email or web address] to find provider on call.  Locate the [facility] provider you are looking for under [facility] and page to a number that you can be directly reached. If you still have difficulty reaching the provider, please page the [facility] (Director on Call) for the Hospitalists listed on amion for assistance.  09/09/2023, 2:45 PM  ]

## 2023-09-09 NOTE — Plan of Care (Signed)

## 2023-09-10 ENCOUNTER — Inpatient Hospital Stay (HOSPITAL_COMMUNITY)

## 2023-09-10 DIAGNOSIS — J189 Pneumonia, unspecified organism: Secondary | ICD-10-CM | POA: Diagnosis not present

## 2023-09-10 DIAGNOSIS — J441 Chronic obstructive pulmonary disease with (acute) exacerbation: Secondary | ICD-10-CM | POA: Diagnosis not present

## 2023-09-10 DIAGNOSIS — N186 End stage renal disease: Secondary | ICD-10-CM | POA: Diagnosis not present

## 2023-09-10 LAB — CBC
HCT: 24.1 % — ABNORMAL LOW (ref 36.0–46.0)
Hemoglobin: 7.6 g/dL — ABNORMAL LOW (ref 12.0–15.0)
MCH: 27.9 pg (ref 26.0–34.0)
MCHC: 31.5 g/dL (ref 30.0–36.0)
MCV: 88.6 fL (ref 80.0–100.0)
Platelets: 439 10*3/uL — ABNORMAL HIGH (ref 150–400)
RBC: 2.72 MIL/uL — ABNORMAL LOW (ref 3.87–5.11)
RDW: 16.9 % — ABNORMAL HIGH (ref 11.5–15.5)
WBC: 15.4 10*3/uL — ABNORMAL HIGH (ref 4.0–10.5)
nRBC: 0 % (ref 0.0–0.2)

## 2023-09-10 LAB — RENAL FUNCTION PANEL
Albumin: 3.1 g/dL — ABNORMAL LOW (ref 3.5–5.0)
Anion gap: 16 — ABNORMAL HIGH (ref 5–15)
BUN: 38 mg/dL — ABNORMAL HIGH (ref 8–23)
CO2: 22 mmol/L (ref 22–32)
Calcium: 8.1 mg/dL — ABNORMAL LOW (ref 8.9–10.3)
Chloride: 102 mmol/L (ref 98–111)
Creatinine, Ser: 7.06 mg/dL — ABNORMAL HIGH (ref 0.44–1.00)
GFR, Estimated: 6 mL/min — ABNORMAL LOW (ref >60)
Glucose, Bld: 114 mg/dL — ABNORMAL HIGH (ref 70–99)
Phosphorus: 4.8 mg/dL — ABNORMAL HIGH (ref 2.5–4.6)
Potassium: 3.6 mmol/L (ref 3.5–5.1)
Sodium: 140 mmol/L (ref 135–145)

## 2023-09-10 LAB — HEPATITIS B SURFACE ANTIGEN: Hepatitis B Surface Ag: NONREACTIVE

## 2023-09-10 MED ORDER — SACCHAROMYCES BOULARDII 250 MG PO CAPS
250.0000 mg | ORAL_CAPSULE | Freq: Two times a day (BID) | ORAL | Status: DC
  Administered 2023-09-10 – 2023-09-15 (×11): 250 mg via ORAL
  Filled 2023-09-10 (×11): qty 1

## 2023-09-10 MED ORDER — LIDOCAINE HCL (PF) 1 % IJ SOLN: 5.0000 mL | INTRAMUSCULAR | Status: DC | PRN

## 2023-09-10 MED ORDER — PENTAFLUOROPROP-TETRAFLUOROETH EX AERO: 1.0000 | INHALATION_SPRAY | CUTANEOUS | Status: DC | PRN

## 2023-09-10 MED ORDER — TRAZODONE HCL 50 MG PO TABS
50.0000 mg | ORAL_TABLET | Freq: Every evening | ORAL | Status: DC | PRN
  Administered 2023-09-10 – 2023-09-12 (×2): 50 mg via ORAL
  Filled 2023-09-10 (×2): qty 1

## 2023-09-10 MED ORDER — LIDOCAINE-PRILOCAINE 2.5-2.5 % EX CREA: 1.0000 | TOPICAL_CREAM | CUTANEOUS | Status: DC | PRN

## 2023-09-10 MED ORDER — DIPHENOXYLATE-ATROPINE 2.5-0.025 MG PO TABS: 1.0000 | ORAL_TABLET | Freq: Four times a day (QID) | ORAL | Status: DC | PRN

## 2023-09-10 MED ORDER — METHYLPREDNISOLONE SODIUM SUCC 40 MG IJ SOLR
40.0000 mg | Freq: Two times a day (BID) | INTRAMUSCULAR | Status: DC
  Administered 2023-09-10 – 2023-09-12 (×5): 40 mg via INTRAVENOUS
  Filled 2023-09-10 (×6): qty 1

## 2023-09-10 MED ORDER — HEPARIN SODIUM (PORCINE) 1000 UNIT/ML DIALYSIS: 20.0000 [IU]/kg | INTRAMUSCULAR | Status: DC | PRN

## 2023-09-10 NOTE — Progress Notes (Signed)
 PROGRESS NOTE   Sheri Cooper  ZOX:[id number] DOB: 06/21/1950 DOA: 09/08/2023 PCP: Leesa Pulling, MD   Chief Complaint  Patient presents with   Shortness of Breath   Level of care: Telemetry  Brief Admission History:  73 y.o. 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  Pt was sent by her hemodialysis center for shortness of breath, patient reports dyspnea since yesterday, cough, congestion, reports he has congestion but unable to produce phlegm, she denies any vomiting, nausea, patient reports she was diagnosed with COVID-19 2 weeks ago, all COVID symptoms has resolved, patient reports she was treated for pneumonia x 4 over the last year, but most recent was December 2020 for, during dialysis she was noted to be dyspneic so she only received 20 minutes and sent to ED for further evaluation. - In ED saturation was 85%, started on 2 L nasal cannula, chest x-ray significant for left pneumonia, some wheezing initially, started on antibiotics, [facility] hospitalist consulted to admit.     Assessment and Plan:  Acute respiratory failure with hypoxia Community-acquired pneumonia COPD exacerbation -admitted with increased work of breathing, hypoxic 85% on room air requiring 2 L nasal cannula. - Encouraged incentive spirometry, flutter valve, she reports this is her 4th bout of pneumonia in 1 year - Check Legionella antigen, strep pneumonia antigen (negative) - Continue with IV Rocephin  and doxycycline   Diarrhea - suspect from antibiotics - added probiotics and PRN lomotil   Chronic diastolic CHF - Questionable effusion x-ray, but no overwhelming evidence of overload on exam, her BNP dated but at baseline. - HD planned for 09/10/23, I discussed with nephrologist    COPD exacerbation - continue with as needed albuterol , scheduled duoneb, IV Solu-Medrol .   ESRD - On hemodialysis TTS schedule, discussed with nephrologist, planning HD on 09/10/23     Hypertension - continue with home medications    Anemia of chronic kidney disease - Continue with p.o. iron , IV iron 's and IV iron  per renal   DVT prophylaxis: [place] heparin  Code Status: Full  Family Communication:  Disposition: anticipate home    Consultants:  Nephrologist   Procedures:   Antimicrobials:    Subjective: Pt says she doesn't feel well today, she is still very SOB and she needs dialysis.  She doesn't feel ready to go home today as she lives alone.    Objective: Vitals:   09/10/23 1442 09/10/23 1502 09/10/23 1530 09/10/23 1600  BP: (!) 151/58 (!) 148/53 (!) 160/53 (!) 149/54  Pulse: 73     Resp: 18 16 18 16   Temp: 97.9 F (36.6 C)     TempSrc: Oral     SpO2: 98%     Weight: 43.9 kg     Height:        Intake/Output Summary (Last 24 hours) at 09/10/2023 1620 Last data filed at 09/09/2023 1845 Gross per 24 hour  Intake 240 ml  Output --  Net 240 ml   Filed Weights   09/08/23 1742 09/10/23 1442  Weight: 43.1 kg 43.9 kg   Examination:  General exam: Appears calm and comfortable  Respiratory system: persistent rales RLL Cardiovascular system: normal S1 & S2 heard. No JVD, murmurs, rubs, gallops or clicks. No pedal edema. Gastrointestinal system: Abdomen is nondistended, soft and nontender. No organomegaly or masses felt. Normal bowel sounds heard. Central nervous system: Alert and oriented. No focal neurological deficits. Extremities: Symmetric 5 x 5 power. Skin: No rashes, lesions or ulcers. Psychiatry: Judgement  and insight appear normal. Mood & affect appropriate.   Data Reviewed: I have personally reviewed following labs and imaging studies  CBC: Recent Labs  Lab 09/08/23 1222 09/09/23 0344 09/10/23 0339  WBC 12.6* 9.1 15.4*  NEUTROABS 11.1*  --   --   HGB 8.9* 9.0* 7.6*  HCT 27.9* 27.4* 24.1*  MCV 89.4 89.5 88.6  PLT 462* 449* 439*    Basic Metabolic Panel: Recent Labs  Lab 09/08/23 1222 09/09/23 0344 09/10/23 0339  NA 135 140 140   K 3.6 4.2 3.6  CL 99 103 102  CO2 25 23 22   GLUCOSE 149* 126* 114*  BUN 21 26* 38*  CREATININE 5.15* 5.89* 7.06*  CALCIUM  8.4* 8.1* 8.1*  PHOS  --   --  4.8*    CBG: No results for input(s): "GLUCAP" in the last 168 hours.  Recent Results (from the past 240 hours)  Resp panel by RT-PCR (RSV, Flu A&B, Covid) Anterior Nasal Swab     Status: None   Collection Time: 09/08/23 12:24 PM   Specimen: Anterior Nasal Swab  Result Value Ref Range Status   SARS Coronavirus 2 by RT PCR NEGATIVE NEGATIVE Final    Comment: (NOTE) SARS-CoV-2 target nucleic acids are NOT DETECTED.  The SARS-CoV-2 RNA is generally detectable in upper respiratory specimens during the acute phase of infection. The lowest concentration of SARS-CoV-2 viral copies this assay can detect is 138 copies/mL. A negative result does not preclude SARS-Cov-2 infection and should not be used as the sole basis for treatment or other patient management decisions. A negative result may occur with  improper specimen collection/handling, submission of specimen other than nasopharyngeal swab, presence of viral mutation(s) within the areas targeted by this assay, and inadequate number of viral copies(<138 copies/mL). A negative result must be combined with clinical observations, patient history, and epidemiological information. The expected result is Negative.  Fact Sheet for Patients:  BloggerCourse.com  Fact Sheet for Healthcare Providers:  SeriousBroker.it  This test is no t yet approved or cleared by the [place]  FDA and  has been authorized for detection and/or diagnosis of SARS-CoV-2 by FDA under an Emergency Use Authorization (EUA). This EUA will remain  in effect (meaning this test can be used) for the duration of the COVID-19 declaration under Section 564(b)(1) of the Act, 21 U.S.C.section 360bbb-3(b)(1), unless the authorization is terminated  or revoked sooner.        Influenza A by PCR NEGATIVE NEGATIVE Final   Influenza B by PCR NEGATIVE NEGATIVE Final    Comment: (NOTE) The Xpert Xpress SARS-CoV-2/FLU/RSV plus assay is intended as an aid in the diagnosis of influenza from Nasopharyngeal swab specimens and should not be used as a sole basis for treatment. Nasal washings and aspirates are unacceptable for Xpert Xpress SARS-CoV-2/FLU/RSV testing.  Fact Sheet for Patients: BloggerCourse.com  Fact Sheet for Healthcare Providers: SeriousBroker.it  This test is not yet approved or cleared by the [place]  FDA and has been authorized for detection and/or diagnosis of SARS-CoV-2 by FDA under an Emergency Use Authorization (EUA). This EUA will remain in effect (meaning this test can be used) for the duration of the COVID-19 declaration under Section 564(b)(1) of the Act, 21 U.S.C. section 360bbb-3(b)(1), unless the authorization is terminated or revoked.     Resp Syncytial Virus by PCR NEGATIVE NEGATIVE Final    Comment: (NOTE) Fact Sheet for Patients: BloggerCourse.com  Fact Sheet for Healthcare Providers: SeriousBroker.it  This test is not yet approved or cleared  by the [place]  FDA and has been authorized for detection and/or diagnosis of SARS-CoV-2 by FDA under an Emergency Use Authorization (EUA). This EUA will remain in effect (meaning this test can be used) for the duration of the COVID-19 declaration under Section 564(b)(1) of the Act, 21 U.S.C. section 360bbb-3(b)(1), unless the authorization is terminated or revoked.  Performed at [facility], [address], [place], [place] [address]      Radiology Studies: No results found.  Scheduled Meds:  amLODipine   10 mg Oral Daily   Chlorhexidine  Gluconate Cloth  6 each Topical Q0600   ferrous sulfate   325 mg Oral Once per day on Monday Wednesday Friday   guaiFENesin    600 mg Oral BID   heparin   5,000 Units Subcutaneous Q8H   hydrALAZINE   100 mg Oral Daily   ipratropium-albuterol   3 mL Nebulization TID   methylPREDNISolone  (SOLU-MEDROL ) injection  40 mg Intravenous Q12H   metoprolol  succinate  50 mg Oral Daily   multivitamin  1 tablet Oral QHS   pantoprazole   40 mg Oral BID   saccharomyces boulardii  250 mg Oral BID   sevelamer  carbonate  2,400 mg Oral TID WC   Continuous Infusions:  cefTRIAXone  (ROCEPHIN )  IV 2 g (09/10/23 0827)   doxycycline  (VIBRAMYCIN ) IV 100 mg (09/10/23 0412)    LOS: 2 days   Time spent: 55 mins  Bracha Frankowski Lincoln Renshaw, MD How to contact the [facility] Attending or Consulting provider 7A - 7P or covering provider during after hours 7P -7A, for this patient?  Check the care team in [facility] and look for a) attending/consulting [facility] provider listed and b) the [facility] team listed Log into [email or web address] to find provider on call.  Locate the [facility] provider you are looking for under [facility] and page to a number that you can be directly reached. If you still have difficulty reaching the provider, please page the [facility] (Director on Call) for the Hospitalists listed on amion for assistance.  09/10/2023, 4:20 PM  ]

## 2023-09-10 NOTE — Progress Notes (Signed)
 Mobility Specialist Progress Note:    09/10/23 0925  Mobility  Activity Ambulated with assistance in room;Ambulated with assistance to bathroom;Stood at bedside  Level of Assistance Modified independent, requires aide device or extra time  Assistive Device None  Distance Ambulated (ft) 4 ft  Range of Motion/Exercises Active;All extremities  Activity Response Tolerated well  Mobility Referral Yes  Mobility visit 1 Mobility  Mobility Specialist Start Time (ACUTE ONLY) [id number]  Mobility Specialist Stop Time (ACUTE ONLY) 0925  Mobility Specialist Time Calculation (min) (ACUTE ONLY) 20 min   Pt received requesting assistance to get up. ModI to stand and ambulate with no AD. Tolerated well, asx throughout. Returned pt sitting EOB, MD in room. All needs met.  Glinda Lapping Mobility Specialist Please contact via Special educational needs teacher or  Rehab office at [phone]

## 2023-09-10 NOTE — Plan of Care (Signed)
  Problem: Clinical Measurements: Goal: Ability to maintain clinical measurements within normal limits will improve Outcome: Progressing   Problem: Clinical Measurements: Goal: Diagnostic test results will improve Outcome: Progressing   Problem: Nutrition: Goal: Adequate nutrition will be maintained Outcome: Progressing   Problem: Activity: Goal: Risk for activity intolerance will decrease Outcome: Progressing   Problem: Coping: Goal: Level of anxiety will decrease Outcome: Progressing

## 2023-09-10 NOTE — Plan of Care (Signed)
   Problem: Activity: Goal: Risk for activity intolerance will decrease Outcome: Progressing   Problem: Coping: Goal: Level of anxiety will decrease Outcome: Progressing

## 2023-09-10 NOTE — Procedures (Signed)
   HEMODIALYSIS TREATMENT NOTE:  3.25 hour HD session ordered.  At pt's request, AVG was cannulated with 17g needles ("They have to use the smallest needle.")  AVG is quite narrow and cannulation with 15g needles might have been problematic.    Tx was started with a 3L goal but this was lowered due to c/o cramps.  SBPs 150-160s.  At 1730, about 2.5 hours into treatment, pt c/o sudden onset blurred vision.  She also stated she needed to move her bowels.  UF was paused and NS bolus was given preemptively while waiting for BP reading to capture.  BP was 162/92, HR 70.  Pt described vision as "Whiteout... blurry vision... I can see, but everything's white."  Cranial nerves grossly intact.  Pt was assessed by Dr. Lincoln Renshaw.  MRI was ordered stat and HD was stopped after 2 hours and 40 minutes.  Net UF 1.5 liters.    Post HD BP 153/42, HR 73.  Order parameters for prn Hydralazine  not met at that time.  Pt was transported to MRI with Jeanette Milks RN, primary nurse.   Total run time: 2 hours 40 minutes Net UF: 1.4 liters.  On-call nephrologist Dr. Clevester Dally was notified of above.  Harlan Ervine, RN AP [facility]

## 2023-09-11 DIAGNOSIS — J189 Pneumonia, unspecified organism: Secondary | ICD-10-CM | POA: Diagnosis not present

## 2023-09-11 DIAGNOSIS — J441 Chronic obstructive pulmonary disease with (acute) exacerbation: Secondary | ICD-10-CM | POA: Diagnosis not present

## 2023-09-11 DIAGNOSIS — N186 End stage renal disease: Secondary | ICD-10-CM | POA: Diagnosis not present

## 2023-09-11 LAB — RENAL FUNCTION PANEL
Albumin: 2.9 g/dL — ABNORMAL LOW (ref 3.5–5.0)
Anion gap: 10 (ref 5–15)
BUN: 26 mg/dL — ABNORMAL HIGH (ref 8–23)
CO2: 25 mmol/L (ref 22–32)
Calcium: 8 mg/dL — ABNORMAL LOW (ref 8.9–10.3)
Chloride: 95 mmol/L — ABNORMAL LOW (ref 98–111)
Creatinine, Ser: 4.29 mg/dL — ABNORMAL HIGH (ref 0.44–1.00)
GFR, Estimated: 10 mL/min — ABNORMAL LOW (ref >60)
Glucose, Bld: 108 mg/dL — ABNORMAL HIGH (ref 70–99)
Phosphorus: 3.9 mg/dL (ref 2.5–4.6)
Potassium: 3.9 mmol/L (ref 3.5–5.1)
Sodium: 130 mmol/L — ABNORMAL LOW (ref 135–145)

## 2023-09-11 LAB — CBC
HCT: 24.7 % — ABNORMAL LOW (ref 36.0–46.0)
Hemoglobin: 7.8 g/dL — ABNORMAL LOW (ref 12.0–15.0)
MCH: 27.6 pg (ref 26.0–34.0)
MCHC: 31.6 g/dL (ref 30.0–36.0)
MCV: 87.3 fL (ref 80.0–100.0)
Platelets: 403 10*3/uL — ABNORMAL HIGH (ref 150–400)
RBC: 2.83 MIL/uL — ABNORMAL LOW (ref 3.87–5.11)
RDW: 17.2 % — ABNORMAL HIGH (ref 11.5–15.5)
WBC: 14.9 10*3/uL — ABNORMAL HIGH (ref 4.0–10.5)
nRBC: 0.1 % (ref 0.0–0.2)

## 2023-09-11 LAB — HEPATITIS B SURFACE ANTIBODY, QUANTITATIVE: Hep B S AB Quant (Post): <3.5 m[IU]/mL — ABNORMAL LOW

## 2023-09-11 LAB — LEGIONELLA PNEUMOPHILA SEROGP 1 UR AG: L. pneumophila Serogp 1 Ur Ag: NEGATIVE

## 2023-09-11 NOTE — Plan of Care (Signed)
  Problem: Education: Goal: Knowledge of General Education information will improve Description: Including pain rating scale, medication(s)/side effects and non-pharmacologic comfort measures Outcome: Progressing   Problem: Activity: Goal: Risk for activity intolerance will decrease Outcome: Progressing   Problem: Clinical Measurements: Goal: Respiratory complications will improve Outcome: Progressing   Problem: Nutrition: Goal: Adequate nutrition will be maintained Outcome: Progressing

## 2023-09-11 NOTE — Progress Notes (Signed)
 PROGRESS NOTE   Sheri Cooper  WUJ:[id number] DOB: 1951/01/13 DOA: 09/08/2023 PCP: Leesa Pulling, MD   Chief Complaint  Patient presents with   Shortness of Breath   Level of care: Telemetry  Brief Admission History:  73 y.o. 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  Pt was sent by her hemodialysis center for shortness of breath, patient reports dyspnea since yesterday, cough, congestion, reports he has congestion but unable to produce phlegm, she denies any vomiting, nausea, patient reports she was diagnosed with COVID-19 2 weeks ago, all COVID symptoms has resolved, patient reports she was treated for pneumonia x 4 over the last year, but most recent was December 2020 for, during dialysis she was noted to be dyspneic so she only received 20 minutes and sent to ED for further evaluation. - In ED saturation was 85%, started on 2 L nasal cannula, chest x-ray significant for left pneumonia, some wheezing initially, started on antibiotics, [facility] hospitalist consulted to admit.     Assessment and Plan:  Acute respiratory failure with hypoxia Community-acquired pneumonia COPD exacerbation -admitted with increased work of breathing, hypoxic 85% on room air requiring 2 L nasal cannula. - Encouraged incentive spirometry, flutter valve, she reports this is her 4th bout of pneumonia in 1 year - Check Legionella antigen, strep pneumonia antigen (negative) - Continue with IV Rocephin  and doxycycline  - if improved tomorrow, anticipate DC home  Diarrhea - suspect from antibiotics - added probiotics and PRN lomotil   Chronic diastolic CHF - Questionable effusion x-ray, but no overwhelming evidence of overload on exam, her BNP dated but at baseline. - HD planned for 09/10/23, I discussed with nephrologist    COPD exacerbation - continue with as needed albuterol , scheduled duoneb, IV Solu-Medrol .   ESRD - On hemodialysis TTS schedule, discussed with  nephrologist, planning HD on 09/10/23  - anticipating DC home on 5/12, and she therefore will be able to go to outpatient HD on 5/13   Hypertension - continue with home medications    Anemia of chronic kidney disease - Continue with p.o. iron , IV iron 's and IV iron  per renal   DVT prophylaxis: [place] heparin  Code Status: Full  Family Communication:  Disposition: anticipate home tomorrow    Consultants:  Nephrologist   Procedures:   Antimicrobials:    Anti-infectives (From admission, onward)    Start     Dose/Rate Route Frequency Ordered Stop   09/09/23 1000  cefTRIAXone  (ROCEPHIN ) 2 g in sodium chloride  0.9 % 100 mL IVPB        2 g 200 mL/hr over 30 Minutes Intravenous Every 24 hours 09/08/23 1619     09/08/23 1630  cefTRIAXone  (ROCEPHIN ) 1 g in sodium chloride  0.9 % 100 mL IVPB        1 g 200 mL/hr over 30 Minutes Intravenous  Once 09/08/23 1619 09/09/23 0904   09/08/23 1600  doxycycline  (VIBRAMYCIN ) 100 mg in sodium chloride  0.9 % 250 mL IVPB        100 mg 125 mL/hr over 120 Minutes Intravenous Every 12 hours 09/08/23 1506     09/08/23 1515  cefTRIAXone  (ROCEPHIN ) 1 g in sodium chloride  0.9 % 100 mL IVPB        1 g 200 mL/hr over 30 Minutes Intravenous  Once 09/08/23 1506 09/08/23 1644       Subjective: Says her transient vision changes resolved yesterday about 30 mins after HD finished.  Pt not sure she  will be able to manage by herself at home, had lived with brother for last 1.5 years up until recently.     Objective: Vitals:   09/10/23 2026 09/10/23 2103 09/11/23 0545 09/11/23 0841  BP: (!) 165/44  (!) 165/48   Pulse: 66  (!) 58   Resp: 16  16   Temp: 97.8 F (36.6 C)  98.1 F (36.7 C)   TempSrc: Oral  Oral   SpO2: 98% 96% 99% 98%  Weight:      Height:        Intake/Output Summary (Last 24 hours) at 09/11/2023 1027 Last data filed at 09/11/2023 0510 Gross per 24 hour  Intake 990 ml  Output 1400 ml  Net -410 ml   Filed Weights   09/08/23 1742 09/10/23  1442 09/10/23 1830  Weight: 43.1 kg 43.9 kg 42.2 kg   Examination:  General exam: Appears calm and comfortable  Respiratory system: better air movement, no rales heard, no increased work of breathing.  Cardiovascular system: normal S1 & S2 heard. No JVD, murmurs, rubs, gallops or clicks. No pedal edema. Gastrointestinal system: Abdomen is nondistended, soft and nontender. No organomegaly or masses felt. Normal bowel sounds heard. Central nervous system: Alert and oriented. No focal neurological deficits. Extremities: Symmetric 5 x 5 power. Skin: No rashes, lesions or ulcers. Psychiatry: Judgement and insight appear normal. Mood & affect appropriate.   Data Reviewed: I have personally reviewed following labs and imaging studies  CBC: Recent Labs  Lab 09/08/23 1222 09/09/23 0344 09/10/23 0339 09/11/23 0407  WBC 12.6* 9.1 15.4* 14.9*  NEUTROABS 11.1*  --   --   --   HGB 8.9* 9.0* 7.6* 7.8*  HCT 27.9* 27.4* 24.1* 24.7*  MCV 89.4 89.5 88.6 87.3  PLT 462* 449* 439* 403*    Basic Metabolic Panel: Recent Labs  Lab 09/08/23 1222 09/09/23 0344 09/10/23 0339 09/11/23 0407  NA 135 140 140 130*  K 3.6 4.2 3.6 3.9  CL 99 103 102 95*  CO2 25 23 22 25   GLUCOSE 149* 126* 114* 108*  BUN 21 26* 38* 26*  CREATININE 5.15* 5.89* 7.06* 4.29*  CALCIUM  8.4* 8.1* 8.1* 8.0*  PHOS  --   --  4.8* 3.9    CBG: No results for input(s): "GLUCAP" in the last 168 hours.  Recent Results (from the past 240 hours)  Resp panel by RT-PCR (RSV, Flu A&B, Covid) Anterior Nasal Swab     Status: None   Collection Time: 09/08/23 12:24 PM   Specimen: Anterior Nasal Swab  Result Value Ref Range Status   SARS Coronavirus 2 by RT PCR NEGATIVE NEGATIVE Final    Comment: (NOTE) SARS-CoV-2 target nucleic acids are NOT DETECTED.  The SARS-CoV-2 RNA is generally detectable in upper respiratory specimens during the acute phase of infection. The lowest concentration of SARS-CoV-2 viral copies this assay can  detect is 138 copies/mL. A negative result does not preclude SARS-Cov-2 infection and should not be used as the sole basis for treatment or other patient management decisions. A negative result may occur with  improper specimen collection/handling, submission of specimen other than nasopharyngeal swab, presence of viral mutation(s) within the areas targeted by this assay, and inadequate number of viral copies(<138 copies/mL). A negative result must be combined with clinical observations, patient history, and epidemiological information. The expected result is Negative.  Fact Sheet for Patients:  BloggerCourse.com  Fact Sheet for Healthcare Providers:  SeriousBroker.it  This test is no t yet approved or cleared  by the [place]  FDA and  has been authorized for detection and/or diagnosis of SARS-CoV-2 by FDA under an Emergency Use Authorization (EUA). This EUA will remain  in effect (meaning this test can be used) for the duration of the COVID-19 declaration under Section 564(b)(1) of the Act, 21 U.S.C.section 360bbb-3(b)(1), unless the authorization is terminated  or revoked sooner.       Influenza A by PCR NEGATIVE NEGATIVE Final   Influenza B by PCR NEGATIVE NEGATIVE Final    Comment: (NOTE) The Xpert Xpress SARS-CoV-2/FLU/RSV plus assay is intended as an aid in the diagnosis of influenza from Nasopharyngeal swab specimens and should not be used as a sole basis for treatment. Nasal washings and aspirates are unacceptable for Xpert Xpress SARS-CoV-2/FLU/RSV testing.  Fact Sheet for Patients: BloggerCourse.com  Fact Sheet for Healthcare Providers: SeriousBroker.it  This test is not yet approved or cleared by the [place]  FDA and has been authorized for detection and/or diagnosis of SARS-CoV-2 by FDA under an Emergency Use Authorization (EUA). This EUA will remain in  effect (meaning this test can be used) for the duration of the COVID-19 declaration under Section 564(b)(1) of the Act, 21 U.S.C. section 360bbb-3(b)(1), unless the authorization is terminated or revoked.     Resp Syncytial Virus by PCR NEGATIVE NEGATIVE Final    Comment: (NOTE) Fact Sheet for Patients: BloggerCourse.com  Fact Sheet for Healthcare Providers: SeriousBroker.it  This test is not yet approved or cleared by the [place]  FDA and has been authorized for detection and/or diagnosis of SARS-CoV-2 by FDA under an Emergency Use Authorization (EUA). This EUA will remain in effect (meaning this test can be used) for the duration of the COVID-19 declaration under Section 564(b)(1) of the Act, 21 U.S.C. section 360bbb-3(b)(1), unless the authorization is terminated or revoked.  Performed at [facility], [address] [place], [place] [address]      Radiology Studies: MR BRAIN WO CONTRAST Result Date: 09/10/2023 CLINICAL DATA:  Initial evaluation for acute neuro deficit, stroke suspected. EXAM: MRI HEAD WITHOUT CONTRAST TECHNIQUE: Multiplanar, multiecho pulse sequences of the brain and surrounding structures were obtained without intravenous contrast. COMPARISON:  None Available. FINDINGS: Brain: Cerebral volume within normal limits. Patchy T2/FLAIR hyperintensity involving the periventricular and deep white matter both cerebral hemispheres as well as the pons, most characteristic of chronic microvascular ischemic disease, mild for age. No evidence for acute or subacute infarct. No areas of chronic cortical infarction. No acute or chronic intracranial blood products. No mass lesion, midline shift or mass effect. No hydrocephalus or extra-axial fluid collection. Pituitary gland within normal limits. Vascular: Major intracranial vascular flow voids are maintained. Skull and upper cervical spine: Craniocervical junction within normal  limits. Bone marrow signal intensity normal. No scalp soft tissue abnormality. Sinuses/Orbits: Globes and orbital soft tissues within normal limits. Paranasal sinuses are clear. No mastoid effusion. Other: None. IMPRESSION: 1. No acute intracranial abnormality. 2. Mild chronic microvascular ischemic disease for age. Electronically Signed   By: Virgia Griffins M.D.   On: 09/10/2023 20:00    Scheduled Meds:  amLODipine   10 mg Oral Daily   Chlorhexidine  Gluconate Cloth  6 each Topical Q0600   ferrous sulfate   325 mg Oral Once per day on Monday Wednesday Friday   guaiFENesin   600 mg Oral BID   heparin   5,000 Units Subcutaneous Q8H   hydrALAZINE   100 mg Oral Daily   ipratropium-albuterol   3 mL Nebulization TID   methylPREDNISolone  (SOLU-MEDROL ) injection  40 mg Intravenous Q12H  metoprolol  succinate  50 mg Oral Daily   multivitamin  1 tablet Oral QHS   pantoprazole   40 mg Oral BID   saccharomyces boulardii  250 mg Oral BID   sevelamer  carbonate  2,400 mg Oral TID WC   Continuous Infusions:  cefTRIAXone  (ROCEPHIN )  IV 2 g (09/10/23 0827)   doxycycline  (VIBRAMYCIN ) IV 100 mg (09/11/23 0908)    LOS: 3 days   Time spent: 57 mins  Nicki Gracy Lincoln Renshaw, MD How to contact the [facility] Attending or Consulting provider 7A - 7P or covering provider during after hours 7P -7A, for this patient?  Check the care team in [facility] and look for a) attending/consulting [facility] provider listed and b) the [facility] team listed Log into [email or web address] to find provider on call.  Locate the [facility] provider you are looking for under [facility] and page to a number that you can be directly reached. If you still have difficulty reaching the provider, please page the [facility] (Director on Call) for the Hospitalists listed on amion for assistance.  09/11/2023, 10:27 AM  ]

## 2023-09-12 ENCOUNTER — Inpatient Hospital Stay (HOSPITAL_COMMUNITY)

## 2023-09-12 DIAGNOSIS — N186 End stage renal disease: Secondary | ICD-10-CM | POA: Diagnosis not present

## 2023-09-12 DIAGNOSIS — J189 Pneumonia, unspecified organism: Secondary | ICD-10-CM | POA: Diagnosis not present

## 2023-09-12 DIAGNOSIS — J441 Chronic obstructive pulmonary disease with (acute) exacerbation: Secondary | ICD-10-CM | POA: Diagnosis not present

## 2023-09-12 LAB — RENAL FUNCTION PANEL
Albumin: 3.1 g/dL — ABNORMAL LOW (ref 3.5–5.0)
Anion gap: 11 (ref 5–15)
BUN: 43 mg/dL — ABNORMAL HIGH (ref 8–23)
CO2: 21 mmol/L — ABNORMAL LOW (ref 22–32)
Calcium: 7.8 mg/dL — ABNORMAL LOW (ref 8.9–10.3)
Chloride: 99 mmol/L (ref 98–111)
Creatinine, Ser: 5.93 mg/dL — ABNORMAL HIGH (ref 0.44–1.00)
GFR, Estimated: 7 mL/min — ABNORMAL LOW (ref >60)
Glucose, Bld: 96 mg/dL (ref 70–99)
Phosphorus: 5.5 mg/dL — ABNORMAL HIGH (ref 2.5–4.6)
Potassium: 4.2 mmol/L (ref 3.5–5.1)
Sodium: 131 mmol/L — ABNORMAL LOW (ref 135–145)

## 2023-09-12 MED ORDER — ORAL CARE MOUTH RINSE: 15.0000 mL | OROMUCOSAL | Status: DC | PRN

## 2023-09-12 MED ORDER — PENTAFLUOROPROP-TETRAFLUOROETH EX AERO
INHALATION_SPRAY | CUTANEOUS | Status: AC
  Filled 2023-09-12: qty 30

## 2023-09-12 NOTE — Progress Notes (Addendum)
 [facility] Progress Note    Assessment/ Plan:   # Shortness of breath/pneumonia/COPD/acute hypoxia: Currently on empiric antibiotics and oxygen.  Clinically improving and looks comfortable.   # ESRD: outpatient HD-[facility]. On HD TTS, HD tomorrow   # Hypertension: continue with home medication, restrict fluid intake.  UF with HD.   # Anemia of ESRD: Monitor anemia, erythropoietin as outpatient. Checking Fe panel   # Metabolic Bone Disease: On sevelamer . Po4 acceptable  Addendum 12:50pm: Informed by primary service for worsening SOB, repeat CXR with possibly worsening pulm edema. Discussed with HD RN, will move up HD to today. Reassess tomorrow for further HD/UF needs.    Subjective:   Patient seen and examine bedside. She reports that her breathing is not much better, feels about the same.  Last HD on Saturday, had blurry vision/"white out" of vision. She reports that this never happened on dialysis before, net UF at that time was 1.5L, seems that she was hemodynamically stable during her treatment.   Objective:   BP (!) 177/50 (BP Location: Right Arm)   Pulse 67   Temp 98.6 F (37 C) (Oral)   Resp 18   Ht 4\' 11"  (1.499 m)   Wt 42.2 kg   SpO2 94%   BMI 18.79 kg/m   Intake/Output Summary (Last 24 hours) at 09/12/2023 0744 Last data filed at 09/12/2023 0406 Gross per 24 hour  Intake 1311.6 ml  Output --  Net 1311.6 ml   Weight change:   Physical Exam: Gen: NAD CVS: RRR Resp: cta b/l, no w/r/r/c appreciated, unlabored Abd: soft Ext: no sig edema b/l Les Neuro: awake, alert Dialysis access: LUE AVG +b/t  Imaging: MR BRAIN WO CONTRAST Result Date: 09/10/2023 CLINICAL DATA:  Initial evaluation for acute neuro deficit, stroke suspected. EXAM: MRI HEAD WITHOUT CONTRAST TECHNIQUE: Multiplanar, multiecho pulse sequences of the brain and surrounding structures were obtained without intravenous contrast. COMPARISON:  None Available. FINDINGS: Brain:  Cerebral volume within normal limits. Patchy T2/FLAIR hyperintensity involving the periventricular and deep white matter both cerebral hemispheres as well as the pons, most characteristic of chronic microvascular ischemic disease, mild for age. No evidence for acute or subacute infarct. No areas of chronic cortical infarction. No acute or chronic intracranial blood products. No mass lesion, midline shift or mass effect. No hydrocephalus or extra-axial fluid collection. Pituitary gland within normal limits. Vascular: Major intracranial vascular flow voids are maintained. Skull and upper cervical spine: Craniocervical junction within normal limits. Bone marrow signal intensity normal. No scalp soft tissue abnormality. Sinuses/Orbits: Globes and orbital soft tissues within normal limits. Paranasal sinuses are clear. No mastoid effusion. Other: None. IMPRESSION: 1. No acute intracranial abnormality. 2. Mild chronic microvascular ischemic disease for age. Electronically Signed   By: Virgia Griffins M.D.   On: 09/10/2023 20:00    Labs: BMET Recent Labs  Lab 09/08/23 1222 09/09/23 0344 09/10/23 0339 09/11/23 0407 09/12/23 0400  NA 135 140 140 130* 131*  K 3.6 4.2 3.6 3.9 4.2  CL 99 103 102 95* 99  CO2 25 23 22 25  21*  GLUCOSE 149* 126* 114* 108* 96  BUN 21 26* 38* 26* 43*  CREATININE 5.15* 5.89* 7.06* 4.29* 5.93*  CALCIUM  8.4* 8.1* 8.1* 8.0* 7.8*  PHOS  --   --  4.8* 3.9 5.5*   CBC Recent Labs  Lab 09/08/23 1222 09/09/23 0344 09/10/23 0339 09/11/23 0407  WBC 12.6* 9.1 15.4* 14.9*  NEUTROABS 11.1*  --   --   --  HGB 8.9* 9.0* 7.6* 7.8*  HCT 27.9* 27.4* 24.1* 24.7*  MCV 89.4 89.5 88.6 87.3  PLT 462* 449* 439* 403*    Medications:     amLODipine   10 mg Oral Daily   Chlorhexidine  Gluconate Cloth  6 each Topical Q0600   ferrous sulfate   325 mg Oral Once per day on Monday Wednesday Friday   guaiFENesin   600 mg Oral BID   heparin   5,000 Units Subcutaneous Q8H   hydrALAZINE   100 mg  Oral Daily   ipratropium-albuterol   3 mL Nebulization TID   methylPREDNISolone  (SOLU-MEDROL ) injection  40 mg Intravenous Q12H   metoprolol  succinate  50 mg Oral Daily   multivitamin  1 tablet Oral QHS   pantoprazole   40 mg Oral BID   saccharomyces boulardii  250 mg Oral BID   sevelamer  carbonate  2,400 mg Oral TID WC      Cristi Donalds, MD [facility] 09/12/2023, 7:44 AM

## 2023-09-12 NOTE — Progress Notes (Signed)
 PROGRESS NOTE   Sheri Cooper  VHQ:[id number] DOB: 27-Aug-1950 DOA: 09/08/2023 PCP: Leesa Pulling, MD   Chief Complaint  Patient presents with   Shortness of Breath   Level of care: Telemetry  Brief Admission History:  73 y.o. 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  Pt was sent by her hemodialysis center for shortness of breath, patient reports dyspnea since yesterday, cough, congestion, reports he has congestion but unable to produce phlegm, she denies any vomiting, nausea, patient reports she was diagnosed with COVID-19 2 weeks ago, all COVID symptoms has resolved, patient reports she was treated for pneumonia x 4 over the last year, but most recent was December 2020 for, during dialysis she was noted to be dyspneic so she only received 20 minutes and sent to ED for further evaluation. - In ED saturation was 85%, started on 2 L nasal cannula, chest x-ray significant for left pneumonia, some wheezing initially, started on antibiotics, [facility] hospitalist consulted to admit.     Assessment and Plan:  Acute respiratory failure with hypoxia Community-acquired pneumonia COPD exacerbation -admitted with increased work of breathing, hypoxic 85% on room air requiring 2 L nasal cannula. - Encouraged incentive spirometry, flutter valve, she reports this is her 4th bout of pneumonia in 1 year - Check Legionella antigen, strep pneumonia antigen (negative) - Continue with IV Rocephin  and doxycycline  - I'm not convinced this is truly pneumonia, I think this is pulmonary edema, volume overload - discussed with nephrology, planning for early HD treatment today   Diarrhea - suspect from antibiotics - added probiotics and PRN lomotil   Chronic diastolic CHF - Questionable effusion x-ray, but no overwhelming evidence of overload on exam, her BNP dated but at baseline. - HD planned for 09/10/23, I discussed with nephrologist    COPD exacerbation - continue with as  needed albuterol , scheduled duoneb, IV Solu-Medrol .   ESRD - On hemodialysis TTS schedule, discussed with nephrologist, planning HD on 09/10/23  - discussed with nephrologist,  early HD treatment today off schedule due to acute pulmonary edema    Hypertension - continue with home medications    Anemia of chronic kidney disease - Continue with p.o. iron , IV iron 's and IV iron  per renal   DVT prophylaxis: [place] heparin  Code Status: Full  Family Communication:  Disposition: anticipate home tomorrow    Consultants:  Nephrologist   Procedures:   Antimicrobials:    Anti-infectives (From admission, onward)    Start     Dose/Rate Route Frequency Ordered Stop   09/09/23 1000  cefTRIAXone  (ROCEPHIN ) 2 g in sodium chloride  0.9 % 100 mL IVPB        2 g 200 mL/hr over 30 Minutes Intravenous Every 24 hours 09/08/23 1619     09/08/23 1630  cefTRIAXone  (ROCEPHIN ) 1 g in sodium chloride  0.9 % 100 mL IVPB        1 g 200 mL/hr over 30 Minutes Intravenous  Once 09/08/23 1619 09/09/23 0904   09/08/23 1600  doxycycline  (VIBRAMYCIN ) 100 mg in sodium chloride  0.9 % 250 mL IVPB        100 mg 125 mL/hr over 120 Minutes Intravenous Every 12 hours 09/08/23 1506     09/08/23 1515  cefTRIAXone  (ROCEPHIN ) 1 g in sodium chloride  0.9 % 100 mL IVPB        1 g 200 mL/hr over 30 Minutes Intravenous  Once 09/08/23 1506 09/08/23 1644       Subjective:  Pt had an episode this morning of severe acute SOB and says her breathing doesn't seem to be getting better.      Objective: Vitals:   09/12/23 0828 09/12/23 1112 09/12/23 1124 09/12/23 1236  BP:  (!) 143/54 (!) 142/50 (!) 146/45  Pulse:  71 67 76  Resp:  (!) 40 20 20  Temp:   98.4 F (36.9 C) 98.1 F (36.7 C)  TempSrc:   Oral Oral  SpO2: 96% 93% 94% 94%  Weight:      Height:        Intake/Output Summary (Last 24 hours) at 09/12/2023 1316 Last data filed at 09/12/2023 1237 Gross per 24 hour  Intake 1431.6 ml  Output --  Net 1431.6 ml   Filed  Weights   09/08/23 1742 09/10/23 1442 09/10/23 1830  Weight: 43.1 kg 43.9 kg 42.2 kg   Examination:  General exam: Appears calm and comfortable  Respiratory system: better air movement, no rales heard, no increased work of breathing.  Cardiovascular system: normal S1 & S2 heard. No JVD, murmurs, rubs, gallops or clicks. No pedal edema. Gastrointestinal system: Abdomen is nondistended, soft and nontender. No organomegaly or masses felt. Normal bowel sounds heard. Central nervous system: Alert and oriented. No focal neurological deficits. Extremities: Symmetric 5 x 5 power. Skin: No rashes, lesions or ulcers. Psychiatry: Judgement and insight appear normal. Mood & affect appropriate.   Data Reviewed: I have personally reviewed following labs and imaging studies  CBC: Recent Labs  Lab 09/08/23 1222 09/09/23 0344 09/10/23 0339 09/11/23 0407  WBC 12.6* 9.1 15.4* 14.9*  NEUTROABS 11.1*  --   --   --   HGB 8.9* 9.0* 7.6* 7.8*  HCT 27.9* 27.4* 24.1* 24.7*  MCV 89.4 89.5 88.6 87.3  PLT 462* 449* 439* 403*    Basic Metabolic Panel: Recent Labs  Lab 09/08/23 1222 09/09/23 0344 09/10/23 0339 09/11/23 0407 09/12/23 0400  NA 135 140 140 130* 131*  K 3.6 4.2 3.6 3.9 4.2  CL 99 103 102 95* 99  CO2 25 23 22 25  21*  GLUCOSE 149* 126* 114* 108* 96  BUN 21 26* 38* 26* 43*  CREATININE 5.15* 5.89* 7.06* 4.29* 5.93*  CALCIUM  8.4* 8.1* 8.1* 8.0* 7.8*  PHOS  --   --  4.8* 3.9 5.5*    CBG: No results for input(s): "GLUCAP" in the last 168 hours.  Recent Results (from the past 240 hours)  Resp panel by RT-PCR (RSV, Flu A&B, Covid) Anterior Nasal Swab     Status: None   Collection Time: 09/08/23 12:24 PM   Specimen: Anterior Nasal Swab  Result Value Ref Range Status   SARS Coronavirus 2 by RT PCR NEGATIVE NEGATIVE Final    Comment: (NOTE) SARS-CoV-2 target nucleic acids are NOT DETECTED.  The SARS-CoV-2 RNA is generally detectable in upper respiratory specimens during the acute  phase of infection. The lowest concentration of SARS-CoV-2 viral copies this assay can detect is 138 copies/mL. A negative result does not preclude SARS-Cov-2 infection and should not be used as the sole basis for treatment or other patient management decisions. A negative result may occur with  improper specimen collection/handling, submission of specimen other than nasopharyngeal swab, presence of viral mutation(s) within the areas targeted by this assay, and inadequate number of viral copies(<138 copies/mL). A negative result must be combined with clinical observations, patient history, and epidemiological information. The expected result is Negative.  Fact Sheet for Patients:  BloggerCourse.com  Fact Sheet for Healthcare Providers:  SeriousBroker.it  This test is no t yet approved or cleared by the [place]  FDA and  has been authorized for detection and/or diagnosis of SARS-CoV-2 by FDA under an Emergency Use Authorization (EUA). This EUA will remain  in effect (meaning this test can be used) for the duration of the COVID-19 declaration under Section 564(b)(1) of the Act, 21 U.S.C.section 360bbb-3(b)(1), unless the authorization is terminated  or revoked sooner.       Influenza A by PCR NEGATIVE NEGATIVE Final   Influenza B by PCR NEGATIVE NEGATIVE Final    Comment: (NOTE) The Xpert Xpress SARS-CoV-2/FLU/RSV plus assay is intended as an aid in the diagnosis of influenza from Nasopharyngeal swab specimens and should not be used as a sole basis for treatment. Nasal washings and aspirates are unacceptable for Xpert Xpress SARS-CoV-2/FLU/RSV testing.  Fact Sheet for Patients: BloggerCourse.com  Fact Sheet for Healthcare Providers: SeriousBroker.it  This test is not yet approved or cleared by the [place]  FDA and has been authorized for detection and/or diagnosis of  SARS-CoV-2 by FDA under an Emergency Use Authorization (EUA). This EUA will remain in effect (meaning this test can be used) for the duration of the COVID-19 declaration under Section 564(b)(1) of the Act, 21 U.S.C. section 360bbb-3(b)(1), unless the authorization is terminated or revoked.     Resp Syncytial Virus by PCR NEGATIVE NEGATIVE Final    Comment: (NOTE) Fact Sheet for Patients: BloggerCourse.com  Fact Sheet for Healthcare Providers: SeriousBroker.it  This test is not yet approved or cleared by the [place]  FDA and has been authorized for detection and/or diagnosis of SARS-CoV-2 by FDA under an Emergency Use Authorization (EUA). This EUA will remain in effect (meaning this test can be used) for the duration of the COVID-19 declaration under Section 564(b)(1) of the Act, 21 U.S.C. section 360bbb-3(b)(1), unless the authorization is terminated or revoked.  Performed at [facility], [address] [place], [place] [address]      Radiology Studies: DG CHEST PORT 1 VIEW Result Date: 09/12/2023 CLINICAL DATA:  Dyspnea.  Pneumonia.  Acute hypoxia. EXAM: PORTABLE CHEST 1 VIEW COMPARISON:  One-view chest x-ray 09/08/2023. FINDINGS: Heart is enlarged. Atherosclerotic calcifications are present at the aortic arch. Bilateral pleural effusions and basilar airspace disease increased. Surgical clips over the left breast are again. IMPRESSION: 1. Increased bilateral pleural effusions and basilar airspace disease. This likely reflects edema. 2. Cardiomegaly without failure. Electronically Signed   By: Audree Leas M.D.   On: 09/12/2023 12:30   MR BRAIN WO CONTRAST Result Date: 09/10/2023 CLINICAL DATA:  Initial evaluation for acute neuro deficit, stroke suspected. EXAM: MRI HEAD WITHOUT CONTRAST TECHNIQUE: Multiplanar, multiecho pulse sequences of the brain and surrounding structures were obtained without intravenous contrast.  COMPARISON:  None Available. FINDINGS: Brain: Cerebral volume within normal limits. Patchy T2/FLAIR hyperintensity involving the periventricular and deep white matter both cerebral hemispheres as well as the pons, most characteristic of chronic microvascular ischemic disease, mild for age. No evidence for acute or subacute infarct. No areas of chronic cortical infarction. No acute or chronic intracranial blood products. No mass lesion, midline shift or mass effect. No hydrocephalus or extra-axial fluid collection. Pituitary gland within normal limits. Vascular: Major intracranial vascular flow voids are maintained. Skull and upper cervical spine: Craniocervical junction within normal limits. Bone marrow signal intensity normal. No scalp soft tissue abnormality. Sinuses/Orbits: Globes and orbital soft tissues within normal limits. Paranasal sinuses are clear. No mastoid effusion. Other: None. IMPRESSION: 1. No acute intracranial abnormality.  2. Mild chronic microvascular ischemic disease for age. Electronically Signed   By: Virgia Griffins M.D.   On: 09/10/2023 20:00   Scheduled Meds:  amLODipine   10 mg Oral Daily   Chlorhexidine  Gluconate Cloth  6 each Topical Q0600   ferrous sulfate   325 mg Oral Once per day on Monday Wednesday Friday   guaiFENesin   600 mg Oral BID   heparin   5,000 Units Subcutaneous Q8H   hydrALAZINE   100 mg Oral Daily   ipratropium-albuterol   3 mL Nebulization TID   methylPREDNISolone  (SOLU-MEDROL ) injection  40 mg Intravenous Q12H   metoprolol  succinate  50 mg Oral Daily   multivitamin  1 tablet Oral QHS   pantoprazole   40 mg Oral BID   saccharomyces boulardii  250 mg Oral BID   sevelamer  carbonate  2,400 mg Oral TID WC   Continuous Infusions:  cefTRIAXone  (ROCEPHIN )  IV 2 g (09/12/23 0809)   doxycycline  (VIBRAMYCIN ) IV 100 mg (09/12/23 0909)    LOS: 4 days   Time spent: 56 mins  Seibert Keeter Lincoln Renshaw, MD How to contact the [facility] Attending or Consulting provider 7A - 7P  or covering provider during after hours 7P -7A, for this patient?  Check the care team in [facility] and look for a) attending/consulting [facility] provider listed and b) the [facility] team listed Log into [email or web address] to find provider on call.  Locate the [facility] provider you are looking for under [facility] and page to a number that you can be directly reached. If you still have difficulty reaching the provider, please page the [facility] (Director on Call) for the Hospitalists listed on amion for assistance.  09/12/2023, 1:16 PM  ]

## 2023-09-12 NOTE — Plan of Care (Signed)
   Problem: Activity: Goal: Risk for activity intolerance will decrease Outcome: Progressing   Problem: Coping: Goal: Level of anxiety will decrease Outcome: Progressing

## 2023-09-12 NOTE — Progress Notes (Signed)
   HEMODIALYSIS TREATMENT NOTE:  3.25 hour low-heparin  session completed.  Goal met: 3 liters removed.  Hemodynamically stable throughout treatment.  All blood was returned.  Hemostasis was achieved in 10 minutes.      09/12/23 1900  Vital Signs  Temp 97.8 F (36.6 C)  Temp Source Oral  Pulse Rate 68  Pulse Rate Source Monitor  Resp 18  BP (!) 160/50  BP Location Right Arm  BP Method Automatic  Patient Position (if appropriate) Sitting  Oxygen Therapy  SpO2 99 %  O2 Device Nasal Cannula  O2 Flow Rate (L/min) 3 L/min  Pain Assessment  Pain Score 0  Dialysis Weight  Weight 40.2 kg  Type of Weight Post-Dialysis  Post Treatment  Dialyzer Clearance Clear  Hemodialysis Intake (mL) 0 mL  Liters Processed 53.4  Fluid Removed (mL) 3000 mL  Tolerated HD Treatment Yes  Post-Hemodialysis Comments Goal met  AVG/AVF Arterial Site Held (minutes) 7 minutes  AVG/AVF Venous Site Held (minutes) 7 minutes  Fistula / Graft Left Upper arm Arteriovenous vein graft  Placement Date/Time: 11/29/22 1428   Orientation: Left  Access Location: Upper arm  Access Type: Arteriovenous vein graft  Fistula / Graft Assessment Thrill;Bruit  Status Patent   Waunita Haff, RN [facility]

## 2023-09-12 NOTE — Procedures (Signed)
 I was present at this dialysis session. I have reviewed the session itself and made appropriate changes.   Patient reports she is already feeling better in regards to her breathing. Does have variable access pressures and low flows despite lower BFR---will likely need another fgram as an outpatient. Discussed with HD RN.  Filed Weights   09/08/23 1742 09/10/23 1442 09/10/23 1830  Weight: 43.1 kg 43.9 kg 42.2 kg    Recent Labs  Lab 09/12/23 0400  NA 131*  K 4.2  CL 99  CO2 21*  GLUCOSE 96  BUN 43*  CREATININE 5.93*  CALCIUM  7.8*  PHOS 5.5*    Recent Labs  Lab 09/08/23 1222 09/09/23 0344 09/10/23 0339 09/11/23 0407  WBC 12.6* 9.1 15.4* 14.9*  NEUTROABS 11.1*  --   --   --   HGB 8.9* 9.0* 7.6* 7.8*  HCT 27.9* 27.4* 24.1* 24.7*  MCV 89.4 89.5 88.6 87.3  PLT 462* 449* 439* 403*    Scheduled Meds:  amLODipine   10 mg Oral Daily   Chlorhexidine  Gluconate Cloth  6 each Topical Q0600   ferrous sulfate   325 mg Oral Once per day on Monday Wednesday Friday   guaiFENesin   600 mg Oral BID   heparin   5,000 Units Subcutaneous Q8H   hydrALAZINE   100 mg Oral Daily   ipratropium-albuterol   3 mL Nebulization TID   methylPREDNISolone  (SOLU-MEDROL ) injection  40 mg Intravenous Q12H   metoprolol  succinate  50 mg Oral Daily   multivitamin  1 tablet Oral QHS   pantoprazole   40 mg Oral BID   saccharomyces boulardii  250 mg Oral BID   sevelamer  carbonate  2,400 mg Oral TID WC   Continuous Infusions:  cefTRIAXone  (ROCEPHIN )  IV 2 g (09/12/23 0809)   doxycycline  (VIBRAMYCIN ) IV 100 mg (09/12/23 0909)   PRN Meds:.acetaminophen  **OR** acetaminophen , albuterol , diphenoxylate-atropine, heparin , hydrALAZINE , lidocaine  (PF), lidocaine -prilocaine , metoCLOPramide  (REGLAN ) injection, mouth rinse, pentafluoroprop-tetrafluoroeth, traZODone   Jarae Panas, MD [facility] 09/12/2023, 4:06 PM

## 2023-09-13 ENCOUNTER — Inpatient Hospital Stay (HOSPITAL_COMMUNITY)

## 2023-09-13 DIAGNOSIS — J189 Pneumonia, unspecified organism: Secondary | ICD-10-CM | POA: Diagnosis not present

## 2023-09-13 DIAGNOSIS — N186 End stage renal disease: Secondary | ICD-10-CM | POA: Diagnosis not present

## 2023-09-13 DIAGNOSIS — J441 Chronic obstructive pulmonary disease with (acute) exacerbation: Secondary | ICD-10-CM | POA: Diagnosis not present

## 2023-09-13 LAB — CBC
HCT: 24.5 % — ABNORMAL LOW (ref 36.0–46.0)
Hemoglobin: 8.2 g/dL — ABNORMAL LOW (ref 12.0–15.0)
MCH: 28.4 pg (ref 26.0–34.0)
MCHC: 33.5 g/dL (ref 30.0–36.0)
MCV: 84.8 fL (ref 80.0–100.0)
Platelets: 393 10*3/uL (ref 150–400)
RBC: 2.89 MIL/uL — ABNORMAL LOW (ref 3.87–5.11)
RDW: 17.8 % — ABNORMAL HIGH (ref 11.5–15.5)
WBC: 15.3 10*3/uL — ABNORMAL HIGH (ref 4.0–10.5)
nRBC: 0.2 % (ref 0.0–0.2)

## 2023-09-13 LAB — PROCALCITONIN: Procalcitonin: 0.17 ng/mL

## 2023-09-13 LAB — MAGNESIUM: Magnesium: 2.1 mg/dL (ref 1.7–2.4)

## 2023-09-13 LAB — IRON AND TIBC
Iron: 49 ug/dL (ref 28–170)
Saturation Ratios: 24 % (ref 10.4–31.8)
TIBC: 207 ug/dL — ABNORMAL LOW (ref 250–450)
UIBC: 158 ug/dL

## 2023-09-13 LAB — FERRITIN: Ferritin: 485 ng/mL — ABNORMAL HIGH (ref 11–307)

## 2023-09-13 MED ORDER — PREDNISONE 20 MG PO TABS
10.0000 mg | ORAL_TABLET | Freq: Every day | ORAL | Status: DC
  Administered 2023-09-14 – 2023-09-15 (×2): 10 mg via ORAL
  Filled 2023-09-13 (×2): qty 1

## 2023-09-13 MED ORDER — METHYLPREDNISOLONE SODIUM SUCC 40 MG IJ SOLR
40.0000 mg | Freq: Every day | INTRAMUSCULAR | Status: AC
  Administered 2023-09-13: 40 mg via INTRAVENOUS

## 2023-09-13 MED ORDER — HYDRALAZINE HCL 25 MG PO TABS
50.0000 mg | ORAL_TABLET | Freq: Two times a day (BID) | ORAL | Status: DC
  Administered 2023-09-13 – 2023-09-15 (×3): 50 mg via ORAL
  Filled 2023-09-13 (×3): qty 2

## 2023-09-13 MED ORDER — LOSARTAN POTASSIUM 50 MG PO TABS
50.0000 mg | ORAL_TABLET | Freq: Every day | ORAL | Status: DC
  Administered 2023-09-13 – 2023-09-15 (×2): 50 mg via ORAL
  Filled 2023-09-13 (×2): qty 1

## 2023-09-13 NOTE — Plan of Care (Signed)
   Problem: Coping: Goal: Level of anxiety will decrease Outcome: Progressing   Problem: Pain Managment: Goal: General experience of comfort will improve and/or be controlled Outcome: Progressing

## 2023-09-13 NOTE — Progress Notes (Signed)
 [facility] Progress Note    Assessment/ Plan:   # Acute hypoxic respiratory failure/pneumonia/pulmonary edema: On antibiotics for pneumonia with supplemental oxygen also likely will pulmonary edema.  Improved since yesterday.  Will plan on dialysis again tomorrow   # ESRD: outpatient HD[facility]. On HD TTS, received dialysis on 5/12 for hypoxia and shortness of breath.  Plan for dialysis again tomorrow   # Hypertension: Blood pressure elevated.  On regimen with hydralazine  once daily.  Change hydralazine  to 50 mg twice daily and add losartan 50 mg daily.  Continue other medications as prescribed   # Anemia of ESRD: Hemoglobin slightly better at 8.2 today.  Continue to monitor and consider ESA.  Follow-up iron  studies   # Metabolic Bone Disease: On sevelamer . Po4 acceptable.  Calcium  corrects near normal     Subjective:   Patient states she is feeling much better after dialysis yesterday with 3 L removed.  Shortness of breath significantly improved.  Denies nausea, vomiting, diarrhea, chest pain   Objective:   BP (!) 176/45 (BP Location: Right Arm)   Pulse 64   Temp 97.9 F (36.6 C) (Oral)   Resp 18   Ht 4\' 11"  (1.499 m)   Wt 40.2 kg   SpO2 96%   BMI 17.90 kg/m   Intake/Output Summary (Last 24 hours) at 09/13/2023 0848 Last data filed at 09/12/2023 1900 Gross per 24 hour  Intake 710 ml  Output 3000 ml  Net -2290 ml   Weight change:   Physical Exam: Gen: NAD, sitting in bed CVS: normal rate, no edema Resp: bilateral chest rise with no increased work of breathing Abd: soft, nondistended Ext: Warm and well-perfused Neuro: awake, alert Dialysis access: LUE AVG +b/t  Imaging: DG CHEST PORT 1 VIEW Result Date: 09/13/2023 CLINICAL DATA:  Shortness of breath EXAM: PORTABLE CHEST 1 VIEW COMPARISON:  Sep 12, 2023 FINDINGS: Persistent bilateral pulmonary congestive changes with a small bilateral pleural effusions and basilar atelectasis. IMPRESSION:  Persistent bilateral pulmonary congestive changes with a small bilateral pleural effusions and basilar atelectasis. Electronically Signed   By: Fredrich Jefferson M.D.   On: 09/13/2023 07:27   DG CHEST PORT 1 VIEW Result Date: 09/12/2023 CLINICAL DATA:  Dyspnea.  Pneumonia.  Acute hypoxia. EXAM: PORTABLE CHEST 1 VIEW COMPARISON:  One-view chest x-ray 09/08/2023. FINDINGS: Heart is enlarged. Atherosclerotic calcifications are present at the aortic arch. Bilateral pleural effusions and basilar airspace disease increased. Surgical clips over the left breast are again. IMPRESSION: 1. Increased bilateral pleural effusions and basilar airspace disease. This likely reflects edema. 2. Cardiomegaly without failure. Electronically Signed   By: Audree Leas M.D.   On: 09/12/2023 12:30    Labs: BMET Recent Labs  Lab 09/08/23 1222 09/09/23 0344 09/10/23 0339 09/11/23 0407 09/12/23 0400  NA 135 140 140 130* 131*  K 3.6 4.2 3.6 3.9 4.2  CL 99 103 102 95* 99  CO2 25 23 22 25  21*  GLUCOSE 149* 126* 114* 108* 96  BUN 21 26* 38* 26* 43*  CREATININE 5.15* 5.89* 7.06* 4.29* 5.93*  CALCIUM  8.4* 8.1* 8.1* 8.0* 7.8*  PHOS  --   --  4.8* 3.9 5.5*   CBC Recent Labs  Lab 09/08/23 1222 09/09/23 0344 09/10/23 0339 09/11/23 0407 09/13/23 0356  WBC 12.6* 9.1 15.4* 14.9* 15.3*  NEUTROABS 11.1*  --   --   --   --   HGB 8.9* 9.0* 7.6* 7.8* 8.2*  HCT 27.9* 27.4* 24.1* 24.7* 24.5*  MCV 89.4  89.5 88.6 87.3 84.8  PLT 462* 449* 439* 403* 393    Medications:     amLODipine   10 mg Oral Daily   Chlorhexidine  Gluconate Cloth  6 each Topical Q0600   ferrous sulfate   325 mg Oral Once per day on Monday Wednesday Friday   guaiFENesin   600 mg Oral BID   heparin   5,000 Units Subcutaneous Q8H   hydrALAZINE   50 mg Oral BID   ipratropium-albuterol   3 mL Nebulization TID   losartan  50 mg Oral Daily   methylPREDNISolone  (SOLU-MEDROL ) injection  40 mg Intravenous Daily   metoprolol  succinate  50 mg Oral Daily    multivitamin  1 tablet Oral QHS   pantoprazole   40 mg Oral BID   [START ON 09/14/2023] predniSONE   10 mg Oral Q breakfast   saccharomyces boulardii  250 mg Oral BID   sevelamer  carbonate  2,400 mg Oral TID WC       [facility] 09/13/2023, 8:48 AM

## 2023-09-13 NOTE — Progress Notes (Signed)
 PROGRESS NOTE   Sheri Cooper  ZOX:[id number] DOB: 1951-01-31 DOA: 09/08/2023 PCP: Leesa Pulling, MD   Chief Complaint  Patient presents with   Shortness of Breath   Level of care: Telemetry  Brief Admission History:  73 y.o. 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  Pt was sent by her hemodialysis center for shortness of breath, patient reports dyspnea since yesterday, cough, congestion, reports he has congestion but unable to produce phlegm, she denies any vomiting, nausea, patient reports she was diagnosed with COVID-19 2 weeks ago, all COVID symptoms has resolved, patient reports she was treated for pneumonia x 4 over the last year, but most recent was December 2020 for, during dialysis she was noted to be dyspneic so she only received 20 minutes and sent to ED for further evaluation. - In ED saturation was 85%, started on 2 L nasal cannula, chest x-ray significant for left pneumonia, some wheezing initially, started on antibiotics, [facility] hospitalist consulted to admit.     Assessment and Plan:  Acute respiratory failure with hypoxia Community-acquired pneumonia - ruled out  COPD exacerbation - admitted with increased work of breathing, hypoxic 85% on room air requiring 2 L nasal cannula. - Encouraged incentive spirometry, flutter valve, she reports this is her 4th bout of pneumonia in 1 year - Check Legionella antigen (negative), strep pneumonia antigen (negative) - Initially treated with IV Rocephin  and doxycycline  - discontinued - I'm not convinced this is truly pneumonia, I think this is pulmonary edema, volume overload, pneumonia ruled out - discussed with nephrology, planning for early HD treatment today   Diarrhea - resolved  - suspect from antibiotics - added probiotics and PRN lomotil   Chronic diastolic CHF Acute HFpEF/Flash Pulmonary Edema - developed acute SOB with pulmonary edema on 5/12, went for urgent HD on 5/12, 3L fluid  removed. - HD planned for 09/14/23, I discussed with nephrologist Dr. Cindra Cree   COPD exacerbation - resolved  - continue with as needed albuterol , scheduled duoneb, IV Solu-Medrol  discontinued.   ESRD - On hemodialysis TTS schedule, discussed with nephrologist - discussed with nephrologist,  early HD treatment today off schedule 5/12 due to acute pulmonary edema and due to [facility] water  crisis, HD deferred to 5/14   Hypertension - continue with home medications    Anemia of chronic kidney disease - Continue with p.o. iron , IV iron 's and IV iron  per renal   DVT prophylaxis: [facility] heparin  Code Status: Full  Family Communication:  Disposition: anticipate home in 1-2 days   Consultants:  Nephrologist   Procedures:   Antimicrobials:    Anti-infectives (From admission, onward)    Start     Dose/Rate Route Frequency Ordered Stop   09/09/23 1000  cefTRIAXone  (ROCEPHIN ) 2 g in sodium chloride  0.9 % 100 mL IVPB  Status:  Discontinued        2 g 200 mL/hr over 30 Minutes Intravenous Every 24 hours 09/08/23 1619 09/13/23 0922   09/08/23 1630  cefTRIAXone  (ROCEPHIN ) 1 g in sodium chloride  0.9 % 100 mL IVPB        1 g 200 mL/hr over 30 Minutes Intravenous  Once 09/08/23 1619 09/09/23 0904   09/08/23 1600  doxycycline  (VIBRAMYCIN ) 100 mg in sodium chloride  0.9 % 250 mL IVPB  Status:  Discontinued        100 mg 125 mL/hr over 120 Minutes Intravenous Every 12 hours 09/08/23 1506 09/13/23 0922   09/08/23 1515  cefTRIAXone  (  ROCEPHIN ) 1 g in sodium chloride  0.9 % 100 mL IVPB        1 g 200 mL/hr over 30 Minutes Intravenous  Once 09/08/23 1506 09/08/23 1644       Subjective: Pt much less SOB after dialysis on 5/12 where 3L of fluid was removed      Objective: Vitals:   09/13/23 0342 09/13/23 0923 09/13/23 0926 09/13/23 0928  BP: (!) 176/45 (!) 146/39 (!) 146/39 (!) 146/39  Pulse: 64  (!) 59   Resp: 18 20    Temp: 97.9 F (36.6 C)     TempSrc: Oral     SpO2: 96%     Weight:       Height:        Intake/Output Summary (Last 24 hours) at 09/13/2023 1125 Last data filed at 09/12/2023 1900 Gross per 24 hour  Intake 590 ml  Output 3000 ml  Net -2410 ml   Filed Weights   09/10/23 1830 09/12/23 1430 09/12/23 1900  Weight: 42.2 kg 43.7 kg 40.2 kg   Examination:  General exam: Appears calm and comfortable  Respiratory system: better air movement, no rales heard, no increased work of breathing.  Cardiovascular system: normal S1 & S2 heard. No JVD, murmurs, rubs, gallops or clicks. No pedal edema. Gastrointestinal system: Abdomen is nondistended, soft and nontender. No organomegaly or masses felt. Normal bowel sounds heard. Central nervous system: Alert and oriented. No focal neurological deficits. Extremities: Symmetric 5 x 5 power. Skin: No rashes, lesions or ulcers. Psychiatry: Judgement and insight appear normal. Mood & affect appropriate.   Data Reviewed: I have personally reviewed following labs and imaging studies  CBC: Recent Labs  Lab 09/08/23 1222 09/09/23 0344 09/10/23 0339 09/11/23 0407 09/13/23 0356  WBC 12.6* 9.1 15.4* 14.9* 15.3*  NEUTROABS 11.1*  --   --   --   --   HGB 8.9* 9.0* 7.6* 7.8* 8.2*  HCT 27.9* 27.4* 24.1* 24.7* 24.5*  MCV 89.4 89.5 88.6 87.3 84.8  PLT 462* 449* 439* 403* 393    Basic Metabolic Panel: Recent Labs  Lab 09/09/23 0344 09/10/23 0339 09/11/23 0407 09/12/23 0400 09/13/23 0356  NA 140 140 130* 131* 130*  K 4.2 3.6 3.9 4.2 4.3  CL 103 102 95* 99 92*  CO2 23 22 25  21* 26  GLUCOSE 126* 114* 108* 96 94  BUN 26* 38* 26* 43* 25*  CREATININE 5.89* 7.06* 4.29* 5.93* 3.58*  CALCIUM  8.1* 8.1* 8.0* 7.8* 8.0*  PHOS  --  4.8* 3.9 5.5* PENDING    CBG: No results for input(s): "GLUCAP" in the last 168 hours.  Recent Results (from the past 240 hours)  Resp panel by RT-PCR (RSV, Flu A&B, Covid) Anterior Nasal Swab     Status: None   Collection Time: 09/08/23 12:24 PM   Specimen: Anterior Nasal Swab  Result Value Ref  Range Status   SARS Coronavirus 2 by RT PCR NEGATIVE NEGATIVE Final    Comment: (NOTE) SARS-CoV-2 target nucleic acids are NOT DETECTED.  The SARS-CoV-2 RNA is generally detectable in upper respiratory specimens during the acute phase of infection. The lowest concentration of SARS-CoV-2 viral copies this assay can detect is 138 copies/mL. A negative result does not preclude SARS-Cov-2 infection and should not be used as the sole basis for treatment or other patient management decisions. A negative result may occur with  improper specimen collection/handling, submission of specimen other than nasopharyngeal swab, presence of viral mutation(s) within the areas targeted by this  assay, and inadequate number of viral copies(<138 copies/mL). A negative result must be combined with clinical observations, patient history, and epidemiological information. The expected result is Negative.  Fact Sheet for Patients:  BloggerCourse.com  Fact Sheet for Healthcare Providers:  SeriousBroker.it  This test is no t yet approved or cleared by the [place]  FDA and  has been authorized for detection and/or diagnosis of SARS-CoV-2 by FDA under an Emergency Use Authorization (EUA). This EUA will remain  in effect (meaning this test can be used) for the duration of the COVID-19 declaration under Section 564(b)(1) of the Act, 21 U.S.C.section 360bbb-3(b)(1), unless the authorization is terminated  or revoked sooner.       Influenza A by PCR NEGATIVE NEGATIVE Final   Influenza B by PCR NEGATIVE NEGATIVE Final    Comment: (NOTE) The Xpert Xpress SARS-CoV-2/FLU/RSV plus assay is intended as an aid in the diagnosis of influenza from Nasopharyngeal swab specimens and should not be used as a sole basis for treatment. Nasal washings and aspirates are unacceptable for Xpert Xpress SARS-CoV-2/FLU/RSV testing.  Fact Sheet for  Patients: BloggerCourse.com  Fact Sheet for Healthcare Providers: SeriousBroker.it  This test is not yet approved or cleared by the [place]  FDA and has been authorized for detection and/or diagnosis of SARS-CoV-2 by FDA under an Emergency Use Authorization (EUA). This EUA will remain in effect (meaning this test can be used) for the duration of the COVID-19 declaration under Section 564(b)(1) of the Act, 21 U.S.C. section 360bbb-3(b)(1), unless the authorization is terminated or revoked.     Resp Syncytial Virus by PCR NEGATIVE NEGATIVE Final    Comment: (NOTE) Fact Sheet for Patients: BloggerCourse.com  Fact Sheet for Healthcare Providers: SeriousBroker.it  This test is not yet approved or cleared by the [place]  FDA and has been authorized for detection and/or diagnosis of SARS-CoV-2 by FDA under an Emergency Use Authorization (EUA). This EUA will remain in effect (meaning this test can be used) for the duration of the COVID-19 declaration under Section 564(b)(1) of the Act, 21 U.S.C. section 360bbb-3(b)(1), unless the authorization is terminated or revoked.  Performed at [facility], [address], [place], [place] [address]      Radiology Studies: DG CHEST PORT 1 VIEW Result Date: 09/13/2023 CLINICAL DATA:  Shortness of breath EXAM: PORTABLE CHEST 1 VIEW COMPARISON:  Sep 12, 2023 FINDINGS: Persistent bilateral pulmonary congestive changes with a small bilateral pleural effusions and basilar atelectasis. IMPRESSION: Persistent bilateral pulmonary congestive changes with a small bilateral pleural effusions and basilar atelectasis. Electronically Signed   By: Fredrich Jefferson M.D.   On: 09/13/2023 07:27   DG CHEST PORT 1 VIEW Result Date: 09/12/2023 CLINICAL DATA:  Dyspnea.  Pneumonia.  Acute hypoxia. EXAM: PORTABLE CHEST 1 VIEW COMPARISON:  One-view chest x-ray  09/08/2023. FINDINGS: Heart is enlarged. Atherosclerotic calcifications are present at the aortic arch. Bilateral pleural effusions and basilar airspace disease increased. Surgical clips over the left breast are again. IMPRESSION: 1. Increased bilateral pleural effusions and basilar airspace disease. This likely reflects edema. 2. Cardiomegaly without failure. Electronically Signed   By: Audree Leas M.D.   On: 09/12/2023 12:30   Scheduled Meds:  amLODipine   10 mg Oral Daily   Chlorhexidine  Gluconate Cloth  6 each Topical Q0600   ferrous sulfate   325 mg Oral Once per day on Monday Wednesday Friday   guaiFENesin   600 mg Oral BID   heparin   5,000 Units Subcutaneous Q8H   hydrALAZINE   50 mg Oral BID  ipratropium-albuterol   3 mL Nebulization TID   losartan  50 mg Oral Daily   metoprolol  succinate  50 mg Oral Daily   multivitamin  1 tablet Oral QHS   pantoprazole   40 mg Oral BID   [START ON 09/14/2023] predniSONE   10 mg Oral Q breakfast   saccharomyces boulardii  250 mg Oral BID   sevelamer  carbonate  2,400 mg Oral TID WC   Continuous Infusions:   LOS: 5 days   Time spent: 55 mins  Kees Idrovo Lincoln Renshaw, MD How to contact the [facility] Attending or Consulting provider 7A - 7P or covering provider during after hours 7P -7A, for this patient?  Check the care team in [facility] and look for a) attending/consulting [facility] provider listed and b) the [facility] team listed Log into [email or web address] to find provider on call.  Locate the [facility] provider you are looking for under [facility] and page to a number that you can be directly reached. If you still have difficulty reaching the provider, please page the [facility] (Director on Call) for the Hospitalists listed on amion for assistance.  09/13/2023, 11:25 AM  ]

## 2023-09-14 DIAGNOSIS — I5033 Acute on chronic diastolic (congestive) heart failure: Secondary | ICD-10-CM

## 2023-09-14 DIAGNOSIS — J181 Lobar pneumonia, unspecified organism: Secondary | ICD-10-CM

## 2023-09-14 DIAGNOSIS — J9601 Acute respiratory failure with hypoxia: Principal | ICD-10-CM

## 2023-09-14 LAB — CBC
HCT: 26.4 % — ABNORMAL LOW (ref 36.0–46.0)
Hemoglobin: 9.1 g/dL — ABNORMAL LOW (ref 12.0–15.0)
MCH: 29.4 pg (ref 26.0–34.0)
MCHC: 34.5 g/dL (ref 30.0–36.0)
MCV: 85.4 fL (ref 80.0–100.0)
Platelets: 371 10*3/uL (ref 150–400)
RBC: 3.09 MIL/uL — ABNORMAL LOW (ref 3.87–5.11)
RDW: 19 % — ABNORMAL HIGH (ref 11.5–15.5)
WBC: 16.2 10*3/uL — ABNORMAL HIGH (ref 4.0–10.5)
nRBC: 0.1 % (ref 0.0–0.2)

## 2023-09-14 LAB — RENAL FUNCTION PANEL
Albumin: 3 g/dL — ABNORMAL LOW (ref 3.5–5.0)
Albumin: 3 g/dL — ABNORMAL LOW (ref 3.5–5.0)
Anion gap: 12 (ref 5–15)
Anion gap: 14 (ref 5–15)
BUN: 25 mg/dL — ABNORMAL HIGH (ref 8–23)
BUN: 54 mg/dL — ABNORMAL HIGH (ref 8–23)
CO2: 26 mmol/L (ref 22–32)
CO2: 23 mmol/L (ref 22–32)
Calcium: 8 mg/dL — ABNORMAL LOW (ref 8.9–10.3)
Calcium: 7.4 mg/dL — ABNORMAL LOW (ref 8.9–10.3)
Chloride: 92 mmol/L — ABNORMAL LOW (ref 98–111)
Chloride: 93 mmol/L — ABNORMAL LOW (ref 98–111)
Creatinine, Ser: 3.58 mg/dL — ABNORMAL HIGH (ref 0.44–1.00)
Creatinine, Ser: 5.56 mg/dL — ABNORMAL HIGH (ref 0.44–1.00)
GFR, Estimated: 13 mL/min — ABNORMAL LOW (ref >60)
GFR, Estimated: 8 mL/min — ABNORMAL LOW (ref >60)
Glucose, Bld: 94 mg/dL (ref 70–99)
Glucose, Bld: 82 mg/dL (ref 70–99)
Phosphorus: 4.3 mg/dL (ref 2.5–4.6)
Phosphorus: 6.3 mg/dL — ABNORMAL HIGH (ref 2.5–4.6)
Potassium: 4.3 mmol/L (ref 3.5–5.1)
Potassium: 3.9 mmol/L (ref 3.5–5.1)
Sodium: 130 mmol/L — ABNORMAL LOW (ref 135–145)
Sodium: 130 mmol/L — ABNORMAL LOW (ref 135–145)

## 2023-09-14 MED ORDER — IPRATROPIUM-ALBUTEROL 0.5-2.5 (3) MG/3ML IN SOLN
3.0000 mL | Freq: Two times a day (BID) | RESPIRATORY_TRACT | Status: DC
  Administered 2023-09-15: 3 mL via RESPIRATORY_TRACT

## 2023-09-14 NOTE — Progress Notes (Signed)
   HEMODIALYSIS TREATMENT NOTE:  HD was performed on [facility] campus due to source water  contamination in [place].  Pt arrived to 5C05 via [facility] at 1340.  VSS for treatment.  Consent obtained.  Heparin -free HD initiated using left upper arm AVG.  SBPs 160s - 170s throughout treatment, despite UF.  Unstable TMP and rising VPs prompted ending of session 15 minutes early so as to be able to return all blood.  Net UF 2.4 liters.   BP 191/55 after blood return.  Hydralazine  5mg  given IVP at 1805 per prn order.  177/48 --> 176/51.  Post-HD:  09/14/23 1800  Vital Signs  Temp 97.7 F (36.5 C)  Temp Source Oral  Pulse Rate 76  Pulse Rate Source Monitor  Resp 18  BP (!) 184/50  BP Location Right Arm  BP Method Automatic  Patient Position (if appropriate) Sitting  Oxygen Therapy  SpO2 95 %  O2 Device Room Air  Pain Assessment  Pain Scale 0-10  Pain Score 0  Dialysis Weight  Weight 41.5 kg  Type of Weight Pre-Dialysis  Post Treatment  Dialyzer Clearance Heavily streaked  Hemodialysis Intake (mL) 0 mL  Liters Processed 57.4  Fluid Removed (mL) 2400 mL  Tolerated HD Treatment Yes  Post-Hemodialysis Comments HD stopped 19m early d/t unstable TMP and clotting circuit.  Able to return all blood  AVG/AVF Arterial Site Held (minutes) 7 minutes  AVG/AVF Venous Site Held (minutes) 7 minutes  Fistula / Graft Left Upper arm Arteriovenous vein graft  Placement Date/Time: 11/29/22 1428   Orientation: Left  Access Location: Upper arm  Access Type: Arteriovenous vein graft  Fistula / Graft Assessment Thrill;Bruit  Status Patent     Report was called to Bascom Lily, RN.   Pt left [facility] campus via [facility] at 2022.  Report was called to Gatha Kaska, RN at [facility].  Kiauna Zywicki, RN [facility]

## 2023-09-14 NOTE — Plan of Care (Signed)

## 2023-09-14 NOTE — Progress Notes (Signed)
 PROGRESS NOTE  Sheri Cooper ZOX:[id number] DOB: May 24, 1950 DOA: 09/08/2023 PCP: Leesa Pulling, MD  Brief History:  73 y.o. 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  Pt was sent by her hemodialysis center for shortness of breath, patient reports dyspnea since yesterday, cough, congestion, reports he has congestion but unable to produce phlegm, she denies any vomiting, nausea, patient reports she was diagnosed with COVID-19 2 weeks ago, all COVID symptoms has resolved, patient reports she was treated for pneumonia x 4 over the last year, but most recent was December 2020 for, during dialysis she was noted to be dyspneic so she only received 20 minutes and sent to ED for further evaluation. - In ED saturation was 85%, started on 2 L nasal cannula, chest x-ray significant for left pneumonia, some wheezing initially, started on antibiotics, [facility] hospitalist consulted to admit.     Assessment/Plan: Acute respiratory failure with hypoxia Community-acquired pneumonia - ruled out  COPD exacerbation - admitted with increased work of breathing, hypoxic 85% on room air requiring 2 L nasal cannula. - Encouraged incentive spirometry, flutter valve, - Check Legionella antigen (negative), strep pneumonia antigen (negative) - Initially treated with IV Rocephin  and doxycycline  - discontinued--received 5 days of both during the hospitalization - I'm not convinced this is truly pneumonia, I think this is pulmonary edema, volume overload, pneumonia ruled out - planning HD again 09/13/13 - COVID neg   Diarrhea - resolved  - suspect from antibiotics - added probiotics and PRN lomotil    Acute on chronic HFpEF/Flash Pulmonary Edema - developed acute SOB with pulmonary edema on 5/12, went for urgent HD on 5/12, 3L fluid removed. - HD planned for 09/14/23   COPD exacerbation - resolved  - continue with as needed albuterol , scheduled duoneb, IV Solu-Medrol   discontinued.   ESRD - On hemodialysis TTS schedule, discussed with nephrologist - discussed with nephrologist,  early HD treatment today off schedule 5/12 due to acute pulmonary edema and due to [facility] water  crisis, HD deferred to 5/14   Hypertension - continue with home medications    Anemia of chronic kidney disease - Continue with p.o. iron , IV iron 's and IV iron  per renal       Family Communication:  no Family at bedside  Consultants:  renal  Code Status:  FULL   DVT Prophylaxis:  [place] Heparin     Procedures: As Listed in Progress Note Above  Antibiotics: Ceftriaxone  5/8-5/13 Doxy 5/8-5/13     Subjective: Patient denies fevers, chills, headache, chest pain, dyspnea, nausea, vomiting, diarrhea, abdominal pain, dysuria, hematuria, hematochezia, and melena.   Objective: Vitals:   09/13/23 2015 09/13/23 2037 09/14/23 0402 09/14/23 0745  BP: (!) 141/44  (!) 141/45   Pulse: 60  66   Resp: 17  16   Temp: 98.7 F (37.1 C)  98 F (36.7 C)   TempSrc: Oral  Oral   SpO2: 99% 99% 99% 98%  Weight:      Height:        Intake/Output Summary (Last 24 hours) at 09/14/2023 1111 Last data filed at 09/14/2023 0800 Gross per 24 hour  Intake 540 ml  Output --  Net 540 ml   Weight change:  Exam:  General:  Pt is alert, follows commands appropriately, not in acute distress HEENT: No icterus, No thrush, No neck mass, [place]/AT Cardiovascular: RRR, S1/S2, no rubs, no gallops Respiratory: bibasilar crackles. No  wheeze Abdomen: Soft/+BS, non tender, non distended, no guarding Extremities: No edema, No lymphangitis, No petechiae, No rashes, no synovitis   Data Reviewed: I have personally reviewed following labs and imaging studies Basic Metabolic Panel: Recent Labs  Lab 09/10/23 0339 09/11/23 0407 09/12/23 0400 09/13/23 0356 09/13/23 0827 09/14/23 0422  NA 140 130* 131* 130*  --  130*  K 3.6 3.9 4.2 4.3  --  3.9  CL 102 95* 99 92*  --  93*  CO2 22 25 21* 26  --   23  GLUCOSE 114* 108* 96 94  --  82  BUN 38* 26* 43* 25*  --  54*  CREATININE 7.06* 4.29* 5.93* 3.58*  --  5.56*  CALCIUM  8.1* 8.0* 7.8* 8.0*  --  7.4*  MG  --   --   --   --  2.1  --   PHOS 4.8* 3.9 5.5* 4.3  --  6.3*   Liver Function Tests: Recent Labs  Lab 09/08/23 1222 09/10/23 0339 09/11/23 0407 09/12/23 0400 09/13/23 0356 09/14/23 0422  AST 18  --   --   --   --   --   ALT 11  --   --   --   --   --   ALKPHOS 91  --   --   --   --   --   BILITOT 0.5  --   --   --   --   --   PROT 6.4*  --   --   --   --   --   ALBUMIN  2.9* 3.1* 2.9* 3.1* 3.0* 3.0*   No results for input(s): "LIPASE", "AMYLASE" in the last 168 hours. No results for input(s): "AMMONIA" in the last 168 hours. Coagulation Profile: No results for input(s): "INR", "PROTIME" in the last 168 hours. CBC: Recent Labs  Lab 09/08/23 1222 09/09/23 0344 09/10/23 0339 09/11/23 0407 09/13/23 0356  WBC 12.6* 9.1 15.4* 14.9* 15.3*  NEUTROABS 11.1*  --   --   --   --   HGB 8.9* 9.0* 7.6* 7.8* 8.2*  HCT 27.9* 27.4* 24.1* 24.7* 24.5*  MCV 89.4 89.5 88.6 87.3 84.8  PLT 462* 449* 439* 403* 393   Cardiac Enzymes: No results for input(s): "CKTOTAL", "CKMB", "CKMBINDEX", "TROPONINI" in the last 168 hours. BNP: Invalid input(s): "POCBNP" CBG: No results for input(s): "GLUCAP" in the last 168 hours. HbA1C: No results for input(s): "HGBA1C" in the last 72 hours. Urine analysis:    Component Value Date/Time   COLORURINE YELLOW 05/28/2022 1005   APPEARANCEUR CLOUDY (A) 05/28/2022 1005   LABSPEC 1.018 05/28/2022 1005   PHURINE 7.0 05/28/2022 1005   GLUCOSEU NEGATIVE 05/28/2022 1005   HGBUR SMALL (A) 05/28/2022 1005   BILIRUBINUR NEGATIVE 05/28/2022 1005   KETONESUR NEGATIVE 05/28/2022 1005   PROTEINUR >=300 (A) 05/28/2022 1005   NITRITE NEGATIVE 05/28/2022 1005   LEUKOCYTESUR MODERATE (A) 05/28/2022 1005   Sepsis Labs: @LABRCNTIP (procalcitonin:4,lacticidven:4) ) Recent Results (from the past 240 hours)   Resp panel by RT-PCR (RSV, Flu A&B, Covid) Anterior Nasal Swab     Status: None   Collection Time: 09/08/23 12:24 PM   Specimen: Anterior Nasal Swab  Result Value Ref Range Status   SARS Coronavirus 2 by RT PCR NEGATIVE NEGATIVE Final    Comment: (NOTE) SARS-CoV-2 target nucleic acids are NOT DETECTED.  The SARS-CoV-2 RNA is generally detectable in upper respiratory specimens during the acute phase of infection. The lowest concentration of SARS-CoV-2 viral copies  this assay can detect is 138 copies/mL. A negative result does not preclude SARS-Cov-2 infection and should not be used as the sole basis for treatment or other patient management decisions. A negative result may occur with  improper specimen collection/handling, submission of specimen other than nasopharyngeal swab, presence of viral mutation(s) within the areas targeted by this assay, and inadequate number of viral copies(<138 copies/mL). A negative result must be combined with clinical observations, patient history, and epidemiological information. The expected result is Negative.  Fact Sheet for Patients:  BloggerCourse.com  Fact Sheet for Healthcare Providers:  SeriousBroker.it  This test is no t yet approved or cleared by the [place]  FDA and  has been authorized for detection and/or diagnosis of SARS-CoV-2 by FDA under an Emergency Use Authorization (EUA). This EUA will remain  in effect (meaning this test can be used) for the duration of the COVID-19 declaration under Section 564(b)(1) of the Act, 21 U.S.C.section 360bbb-3(b)(1), unless the authorization is terminated  or revoked sooner.       Influenza A by PCR NEGATIVE NEGATIVE Final   Influenza B by PCR NEGATIVE NEGATIVE Final    Comment: (NOTE) The Xpert Xpress SARS-CoV-2/FLU/RSV plus assay is intended as an aid in the diagnosis of influenza from Nasopharyngeal swab specimens and should not be used  as a sole basis for treatment. Nasal washings and aspirates are unacceptable for Xpert Xpress SARS-CoV-2/FLU/RSV testing.  Fact Sheet for Patients: BloggerCourse.com  Fact Sheet for Healthcare Providers: SeriousBroker.it  This test is not yet approved or cleared by the [place]  FDA and has been authorized for detection and/or diagnosis of SARS-CoV-2 by FDA under an Emergency Use Authorization (EUA). This EUA will remain in effect (meaning this test can be used) for the duration of the COVID-19 declaration under Section 564(b)(1) of the Act, 21 U.S.C. section 360bbb-3(b)(1), unless the authorization is terminated or revoked.     Resp Syncytial Virus by PCR NEGATIVE NEGATIVE Final    Comment: (NOTE) Fact Sheet for Patients: BloggerCourse.com  Fact Sheet for Healthcare Providers: SeriousBroker.it  This test is not yet approved or cleared by the [place]  FDA and has been authorized for detection and/or diagnosis of SARS-CoV-2 by FDA under an Emergency Use Authorization (EUA). This EUA will remain in effect (meaning this test can be used) for the duration of the COVID-19 declaration under Section 564(b)(1) of the Act, 21 U.S.C. section 360bbb-3(b)(1), unless the authorization is terminated or revoked.  Performed at [facility], [address] [place], [place] [address]      Scheduled Meds:  amLODipine   10 mg Oral Daily   Chlorhexidine  Gluconate Cloth  6 each Topical Q0600   ferrous sulfate   325 mg Oral Once per day on Monday Wednesday Friday   guaiFENesin   600 mg Oral BID   heparin   5,000 Units Subcutaneous Q8H   hydrALAZINE   50 mg Oral BID   ipratropium-albuterol   3 mL Nebulization TID   losartan  50 mg Oral Daily   metoprolol  succinate  50 mg Oral Daily   multivitamin  1 tablet Oral QHS   pantoprazole   40 mg Oral BID   predniSONE   10 mg Oral Q breakfast    saccharomyces boulardii  250 mg Oral BID   sevelamer  carbonate  2,400 mg Oral TID WC   Continuous Infusions:  Procedures/Studies: DG CHEST PORT 1 VIEW Result Date: 09/13/2023 CLINICAL DATA:  Shortness of breath EXAM: PORTABLE CHEST 1 VIEW COMPARISON:  Sep 12, 2023 FINDINGS: Persistent bilateral pulmonary congestive  changes with a small bilateral pleural effusions and basilar atelectasis. IMPRESSION: Persistent bilateral pulmonary congestive changes with a small bilateral pleural effusions and basilar atelectasis. Electronically Signed   By: Fredrich Jefferson M.D.   On: 09/13/2023 07:27   DG CHEST PORT 1 VIEW Result Date: 09/12/2023 CLINICAL DATA:  Dyspnea.  Pneumonia.  Acute hypoxia. EXAM: PORTABLE CHEST 1 VIEW COMPARISON:  One-view chest x-ray 09/08/2023. FINDINGS: Heart is enlarged. Atherosclerotic calcifications are present at the aortic arch. Bilateral pleural effusions and basilar airspace disease increased. Surgical clips over the left breast are again. IMPRESSION: 1. Increased bilateral pleural effusions and basilar airspace disease. This likely reflects edema. 2. Cardiomegaly without failure. Electronically Signed   By: Audree Leas M.D.   On: 09/12/2023 12:30   MR BRAIN WO CONTRAST Result Date: 09/10/2023 CLINICAL DATA:  Initial evaluation for acute neuro deficit, stroke suspected. EXAM: MRI HEAD WITHOUT CONTRAST TECHNIQUE: Multiplanar, multiecho pulse sequences of the brain and surrounding structures were obtained without intravenous contrast. COMPARISON:  None Available. FINDINGS: Brain: Cerebral volume within normal limits. Patchy T2/FLAIR hyperintensity involving the periventricular and deep white matter both cerebral hemispheres as well as the pons, most characteristic of chronic microvascular ischemic disease, mild for age. No evidence for acute or subacute infarct. No areas of chronic cortical infarction. No acute or chronic intracranial blood products. No mass lesion, midline shift  or mass effect. No hydrocephalus or extra-axial fluid collection. Pituitary gland within normal limits. Vascular: Major intracranial vascular flow voids are maintained. Skull and upper cervical spine: Craniocervical junction within normal limits. Bone marrow signal intensity normal. No scalp soft tissue abnormality. Sinuses/Orbits: Globes and orbital soft tissues within normal limits. Paranasal sinuses are clear. No mastoid effusion. Other: None. IMPRESSION: 1. No acute intracranial abnormality. 2. Mild chronic microvascular ischemic disease for age. Electronically Signed   By: Virgia Griffins M.D.   On: 09/10/2023 20:00   DG Chest Port 1 View Result Date: 09/08/2023 CLINICAL DATA:  Shortness of breath. EXAM: PORTABLE CHEST 1 VIEW COMPARISON:  Chest [place]  dated 09/05/2023. FINDINGS: Small bilateral pleural effusions and bibasilar atelectasis or infiltrate, slightly progressed since the prior radiograph. There is cardiomegaly with vascular congestion. No pneumothorax. No acute osseous pathology. IMPRESSION: 1. Small bilateral pleural effusions and bibasilar atelectasis or infiltrate. 2. Cardiomegaly with vascular congestion. Electronically Signed   By: Angus Bark M.D.   On: 09/08/2023 12:33    Demaris Fillers, DO  [facility]  If 7PM-7AM, please contact night-coverage [email or web address] Password [email or web address] 09/14/2023, 11:11 AM   LOS: 6 days

## 2023-09-14 NOTE — Progress Notes (Signed)
 [facility] Progress Note    Assessment/ Plan:   # Acute hypoxic respiratory failure/pneumonia/pulmonary edema: On antibiotics for pneumonia with supplemental oxygen also likely will pulmonary edema.  HD today on schedule.    # ESRD: outpatient HD-[facility]. On HD TTS, received dialysis on 5/12 for hypoxia and shortness of breath.  HD today on schedule.   # Hypertension: Blood pressure elevated.  On regimen with hydralazine  once daily.  Change hydralazine  to 50 mg twice daily and add losartan 50 mg daily.  Continue other medications as prescribed   # Anemia of ESRD: Hemoglobin slightly better at 8.2 today.  Continue to monitor and consider ESA.  Follow-up iron  studies   # Metabolic Bone Disease: On sevelamer . Po4 acceptable.  Calcium  corrects near normal     Subjective:    Seen in room.  For HD today.  Can remain here at [facility] campus.    Objective:   BP (!) 141/45 (BP Location: Right Arm)   Pulse 66   Temp 98 F (36.7 C) (Oral)   Resp 16   Ht 4\' 11"  (1.499 m)   Wt 40.2 kg   SpO2 98%   BMI 17.90 kg/m   Intake/Output Summary (Last 24 hours) at 09/14/2023 1002 Last data filed at 09/14/2023 0800 Gross per 24 hour  Intake 540 ml  Output --  Net 540 ml   Weight change:   Physical Exam: Gen: NAD, sitting in bed CVS: normal rate, no edema Resp: bilateral chest rise with no increased work of breathing Abd: soft, nondistended Ext: Warm and well-perfused Neuro: awake, alert Dialysis access: LUE AVG +b/t  Imaging: DG CHEST PORT 1 VIEW Result Date: 09/13/2023 CLINICAL DATA:  Shortness of breath EXAM: PORTABLE CHEST 1 VIEW COMPARISON:  Sep 12, 2023 FINDINGS: Persistent bilateral pulmonary congestive changes with a small bilateral pleural effusions and basilar atelectasis. IMPRESSION: Persistent bilateral pulmonary congestive changes with a small bilateral pleural effusions and basilar atelectasis. Electronically Signed   By: Fredrich Jefferson M.D.   On: 09/13/2023  07:27   DG CHEST PORT 1 VIEW Result Date: 09/12/2023 CLINICAL DATA:  Dyspnea.  Pneumonia.  Acute hypoxia. EXAM: PORTABLE CHEST 1 VIEW COMPARISON:  One-view chest x-ray 09/08/2023. FINDINGS: Heart is enlarged. Atherosclerotic calcifications are present at the aortic arch. Bilateral pleural effusions and basilar airspace disease increased. Surgical clips over the left breast are again. IMPRESSION: 1. Increased bilateral pleural effusions and basilar airspace disease. This likely reflects edema. 2. Cardiomegaly without failure. Electronically Signed   By: Audree Leas M.D.   On: 09/12/2023 12:30    Labs: BMET Recent Labs  Lab 09/08/23 1222 09/09/23 0344 09/10/23 0339 09/11/23 0407 09/12/23 0400 09/13/23 0356 09/14/23 0422  NA 135 140 140 130* 131* 130* 130*  K 3.6 4.2 3.6 3.9 4.2 4.3 3.9  CL 99 103 102 95* 99 92* 93*  CO2 25 23 22 25  21* 26 23  GLUCOSE 149* 126* 114* 108* 96 94 82  BUN 21 26* 38* 26* 43* 25* 54*  CREATININE 5.15* 5.89* 7.06* 4.29* 5.93* 3.58* 5.56*  CALCIUM  8.4* 8.1* 8.1* 8.0* 7.8* 8.0* 7.4*  PHOS  --   --  4.8* 3.9 5.5* 4.3 6.3*   CBC Recent Labs  Lab 09/08/23 1222 09/09/23 0344 09/10/23 0339 09/11/23 0407 09/13/23 0356  WBC 12.6* 9.1 15.4* 14.9* 15.3*  NEUTROABS 11.1*  --   --   --   --   HGB 8.9* 9.0* 7.6* 7.8* 8.2*  HCT 27.9* 27.4* 24.1* 24.7*  24.5*  MCV 89.4 89.5 88.6 87.3 84.8  PLT 462* 449* 439* 403* 393    Medications:     amLODipine   10 mg Oral Daily   Chlorhexidine  Gluconate Cloth  6 each Topical Q0600   ferrous sulfate   325 mg Oral Once per day on Monday Wednesday Friday   guaiFENesin   600 mg Oral BID   heparin   5,000 Units Subcutaneous Q8H   hydrALAZINE   50 mg Oral BID   ipratropium-albuterol   3 mL Nebulization TID   losartan  50 mg Oral Daily   metoprolol  succinate  50 mg Oral Daily   multivitamin  1 tablet Oral QHS   pantoprazole   40 mg Oral BID   predniSONE   10 mg Oral Q breakfast   saccharomyces boulardii  250 mg Oral BID    sevelamer  carbonate  2,400 mg Oral TID WC       [facility] 09/14/2023, 10:02 AM

## 2023-09-15 MED ORDER — HYDRALAZINE HCL 50 MG PO TABS
50.0000 mg | ORAL_TABLET | Freq: Two times a day (BID) | ORAL | 1 refills | Status: DC
Start: 2023-09-15 — End: 2024-01-04

## 2023-09-15 MED ORDER — LOSARTAN POTASSIUM 50 MG PO TABS: 50.0000 mg | ORAL_TABLET | Freq: Every day | ORAL | 1 refills | Status: DC

## 2023-09-15 NOTE — Discharge Summary (Signed)
 Physician Discharge Summary   Patient: Sheri Cooper MRN: [id number] DOB: 1951-01-08  Admit date:     09/08/2023  Discharge date: 09/15/23  Discharge Physician: Myrtie Atkinson Jaloni Davoli   PCP: Leesa Pulling, MD   Recommendations at discharge:   Please follow up with primary care provider within 1-2 weeks  Please repeat BMP and CBC in one week  Hospital Course: 73 y.o. 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  Pt was sent by her hemodialysis center for shortness of breath, patient reports dyspnea since yesterday, cough, congestion, reports he has congestion but unable to produce phlegm, she denies any vomiting, nausea, patient reports she was diagnosed with COVID-19 2 weeks ago, all COVID symptoms has resolved, patient reports she was treated for pneumonia x 4 over the last year, but most recent was December 2020 for, during dialysis she was noted to be dyspneic so she only received 20 minutes and sent to ED for further evaluation. - In ED saturation was 85%, started on 2 L nasal cannula, chest x-ray significant for left pneumonia, some wheezing initially, started on antibiotics, [facility] hospitalist consulted to admit.    Assessment and Plan: Acute respiratory failure with hypoxia Community-acquired pneumonia - ruled out  COPD exacerbation - admitted with increased work of breathing, hypoxic 85% on room air requiring 2 L nasal cannula. - Encouraged incentive spirometry, flutter valve, - Check Legionella antigen (negative), strep pneumonia antigen (negative) - Initially treated with IV Rocephin  and doxycycline  - discontinued--received 5 days of both during the hospitalization - I'm not convinced this is truly pneumonia, I think this is pulmonary edema, volume overload, - last HD 09/14/23 - COVID neg -weaned to RA -09/15/23--ambulated on RA without desaturation <93   Diarrhea - resolved  - suspect from antibiotics - added probiotics and PRN lomotil    Acute on chronic  HFpEF - developed acute SOB with pulmonary edema on 5/12, went for urgent HD on 5/12, 3L fluid removed. - HD planned for 09/14/23   COPD exacerbation - resolved  - continue with as needed albuterol , scheduled duoneb, IV Solu-Medrol  discontinued.   ESRD - On hemodialysis TTS schedule, discussed with nephrologist - discussed with nephrologist,  early HD treatment today off schedule 5/12 due to acute pulmonary edema and due to [facility] water  crisis - received HD 09/14/23   Hypertension - continue with home medications    Anemia of chronic kidney disease - Continue with p.o. iron , IV iron 's and IV iron  per renal          Consultants: renal Procedures performed: none  Disposition: Home Diet recommendation:  Renal diet DISCHARGE MEDICATION: Allergies as of 09/15/2023   No Known Allergies      Medication List     STOP taking these medications    Lagevrio 200 MG Caps capsule Generic drug: molnupiravir EUA       TAKE these medications    amLODipine  10 MG tablet Commonly known as: NORVASC  Take 10 mg by mouth daily.   b complex-vitamin c-folic acid  0.8 MG Tabs tablet Take 1 tablet by mouth at bedtime.   ferrous sulfate  325 (65 FE) MG EC tablet Take 325 mg by mouth 3 (three) times a week.   hydrALAZINE  50 MG tablet Commonly known as: APRESOLINE  Take 1 tablet (50 mg total) by mouth 2 (two) times daily. What changed:  medication strength how much to take when to take this   losartan 50 MG tablet Commonly known as: COZAAR  Take 1 tablet (50 mg total) by mouth daily. Start taking on: Sep 16, 2023   metoprolol  succinate 50 MG 24 hr tablet Commonly known as: TOPROL -XL Take 50 mg by mouth daily.   pantoprazole  40 MG tablet Commonly known as: Protonix  Take 1 tablet (40 mg total) by mouth 2 (two) times daily.   sevelamer  carbonate 800 MG tablet Commonly known as: RENVELA  Take 2,400 mg by mouth 3 (three) times daily with meals.        Discharge  Exam: Filed Weights   09/12/23 1900 09/14/23 1400 09/14/23 1800  Weight: 40.2 kg 43.4 kg 41.5 kg   HEENT:  [place]/AT, No thrush, no icterus CV:  RRR, no rub, no S3, no S4 Lung:  fine bibasilar rales. No wheeze Abd:  soft/+BS, NT Ext:  No edema, no lymphangitis, no synovitis, no rash   Condition at discharge: stable  The results of significant diagnostics from this hospitalization (including imaging, microbiology, ancillary and laboratory) are listed below for reference.   Imaging Studies: DG CHEST PORT 1 VIEW Result Date: 09/13/2023 CLINICAL DATA:  Shortness of breath EXAM: PORTABLE CHEST 1 VIEW COMPARISON:  Sep 12, 2023 FINDINGS: Persistent bilateral pulmonary congestive changes with a small bilateral pleural effusions and basilar atelectasis. IMPRESSION: Persistent bilateral pulmonary congestive changes with a small bilateral pleural effusions and basilar atelectasis. Electronically Signed   By: Fredrich Jefferson M.D.   On: 09/13/2023 07:27   DG CHEST PORT 1 VIEW Result Date: 09/12/2023 CLINICAL DATA:  Dyspnea.  Pneumonia.  Acute hypoxia. EXAM: PORTABLE CHEST 1 VIEW COMPARISON:  One-view chest x-ray 09/08/2023. FINDINGS: Heart is enlarged. Atherosclerotic calcifications are present at the aortic arch. Bilateral pleural effusions and basilar airspace disease increased. Surgical clips over the left breast are again. IMPRESSION: 1. Increased bilateral pleural effusions and basilar airspace disease. This likely reflects edema. 2. Cardiomegaly without failure. Electronically Signed   By: Audree Leas M.D.   On: 09/12/2023 12:30   MR BRAIN WO CONTRAST Result Date: 09/10/2023 CLINICAL DATA:  Initial evaluation for acute neuro deficit, stroke suspected. EXAM: MRI HEAD WITHOUT CONTRAST TECHNIQUE: Multiplanar, multiecho pulse sequences of the brain and surrounding structures were obtained without intravenous contrast. COMPARISON:  None Available. FINDINGS: Brain: Cerebral volume within normal limits.  Patchy T2/FLAIR hyperintensity involving the periventricular and deep white matter both cerebral hemispheres as well as the pons, most characteristic of chronic microvascular ischemic disease, mild for age. No evidence for acute or subacute infarct. No areas of chronic cortical infarction. No acute or chronic intracranial blood products. No mass lesion, midline shift or mass effect. No hydrocephalus or extra-axial fluid collection. Pituitary gland within normal limits. Vascular: Major intracranial vascular flow voids are maintained. Skull and upper cervical spine: Craniocervical junction within normal limits. Bone marrow signal intensity normal. No scalp soft tissue abnormality. Sinuses/Orbits: Globes and orbital soft tissues within normal limits. Paranasal sinuses are clear. No mastoid effusion. Other: None. IMPRESSION: 1. No acute intracranial abnormality. 2. Mild chronic microvascular ischemic disease for age. Electronically Signed   By: Virgia Griffins M.D.   On: 09/10/2023 20:00   DG Chest Port 1 View Result Date: 09/08/2023 CLINICAL DATA:  Shortness of breath. EXAM: PORTABLE CHEST 1 VIEW COMPARISON:  Chest [place]  dated 09/05/2023. FINDINGS: Small bilateral pleural effusions and bibasilar atelectasis or infiltrate, slightly progressed since the prior radiograph. There is cardiomegaly with vascular congestion. No pneumothorax. No acute osseous pathology. IMPRESSION: 1. Small bilateral pleural effusions and bibasilar atelectasis or infiltrate. 2. Cardiomegaly with vascular congestion. Electronically Signed  By: Angus Bark M.D.   On: 09/08/2023 12:33    Microbiology: Results for orders placed or performed during the hospital encounter of 09/08/23  Resp panel by RT-PCR (RSV, Flu A&B, Covid) Anterior Nasal Swab     Status: None   Collection Time: 09/08/23 12:24 PM   Specimen: Anterior Nasal Swab  Result Value Ref Range Status   SARS Coronavirus 2 by RT PCR NEGATIVE NEGATIVE Final     Comment: (NOTE) SARS-CoV-2 target nucleic acids are NOT DETECTED.  The SARS-CoV-2 RNA is generally detectable in upper respiratory specimens during the acute phase of infection. The lowest concentration of SARS-CoV-2 viral copies this assay can detect is 138 copies/mL. A negative result does not preclude SARS-Cov-2 infection and should not be used as the sole basis for treatment or other patient management decisions. A negative result may occur with  improper specimen collection/handling, submission of specimen other than nasopharyngeal swab, presence of viral mutation(s) within the areas targeted by this assay, and inadequate number of viral copies(<138 copies/mL). A negative result must be combined with clinical observations, patient history, and epidemiological information. The expected result is Negative.  Fact Sheet for Patients:  BloggerCourse.com  Fact Sheet for Healthcare Providers:  SeriousBroker.it  This test is no t yet approved or cleared by the [place]  FDA and  has been authorized for detection and/or diagnosis of SARS-CoV-2 by FDA under an Emergency Use Authorization (EUA). This EUA will remain  in effect (meaning this test can be used) for the duration of the COVID-19 declaration under Section 564(b)(1) of the Act, 21 U.S.C.section 360bbb-3(b)(1), unless the authorization is terminated  or revoked sooner.       Influenza A by PCR NEGATIVE NEGATIVE Final   Influenza B by PCR NEGATIVE NEGATIVE Final    Comment: (NOTE) The Xpert Xpress SARS-CoV-2/FLU/RSV plus assay is intended as an aid in the diagnosis of influenza from Nasopharyngeal swab specimens and should not be used as a sole basis for treatment. Nasal washings and aspirates are unacceptable for Xpert Xpress SARS-CoV-2/FLU/RSV testing.  Fact Sheet for Patients: BloggerCourse.com  Fact Sheet for Healthcare  Providers: SeriousBroker.it  This test is not yet approved or cleared by the [place]  FDA and has been authorized for detection and/or diagnosis of SARS-CoV-2 by FDA under an Emergency Use Authorization (EUA). This EUA will remain in effect (meaning this test can be used) for the duration of the COVID-19 declaration under Section 564(b)(1) of the Act, 21 U.S.C. section 360bbb-3(b)(1), unless the authorization is terminated or revoked.     Resp Syncytial Virus by PCR NEGATIVE NEGATIVE Final    Comment: (NOTE) Fact Sheet for Patients: BloggerCourse.com  Fact Sheet for Healthcare Providers: SeriousBroker.it  This test is not yet approved or cleared by the [place]  FDA and has been authorized for detection and/or diagnosis of SARS-CoV-2 by FDA under an Emergency Use Authorization (EUA). This EUA will remain in effect (meaning this test can be used) for the duration of the COVID-19 declaration under Section 564(b)(1) of the Act, 21 U.S.C. section 360bbb-3(b)(1), unless the authorization is terminated or revoked.  Performed at [facility], [address], [place], [place] [address]     Labs: CBC: Recent Labs  Lab 09/09/23 0344 09/10/23 0339 09/11/23 0407 09/13/23 0356 09/14/23 0422  WBC 9.1 15.4* 14.9* 15.3* 16.2*  HGB 9.0* 7.6* 7.8* 8.2* 9.1*  HCT 27.4* 24.1* 24.7* 24.5* 26.4*  MCV 89.5 88.6 87.3 84.8 85.4  PLT 449* 439* 403* 393 371  Basic Metabolic Panel: Recent Labs  Lab 09/10/23 0339 09/11/23 0407 09/12/23 0400 09/13/23 0356 09/13/23 0827 09/14/23 0422  NA 140 130* 131* 130*  --  130*  K 3.6 3.9 4.2 4.3  --  3.9  CL 102 95* 99 92*  --  93*  CO2 22 25 21* 26  --  23  GLUCOSE 114* 108* 96 94  --  82  BUN 38* 26* 43* 25*  --  54*  CREATININE 7.06* 4.29* 5.93* 3.58*  --  5.56*  CALCIUM  8.1* 8.0* 7.8* 8.0*  --  7.4*  MG  --   --   --   --  2.1  --   PHOS 4.8* 3.9 5.5* 4.3   --  6.3*   Liver Function Tests: Recent Labs  Lab 09/10/23 0339 09/11/23 0407 09/12/23 0400 09/13/23 0356 09/14/23 0422  ALBUMIN  3.1* 2.9* 3.1* 3.0* 3.0*   CBG: No results for input(s): "GLUCAP" in the last 168 hours.  Discharge time spent: greater than 30 minutes.  Signed: Demaris Fillers, MD [facility] 09/15/2023

## 2023-09-15 NOTE — Progress Notes (Signed)
 Mobility Specialist Progress Note:    09/15/23 0950  Mobility  Activity Ambulated with assistance in room;Transferred from bed to chair  Level of Assistance Independent  Assistive Device None  Distance Ambulated (ft) 10 ft  Range of Motion/Exercises Active;All extremities  Activity Response Tolerated well  Mobility Referral Yes  Mobility visit 1 Mobility  Mobility Specialist Start Time (ACUTE ONLY) 0930  Mobility Specialist Stop Time (ACUTE ONLY) 0950  Mobility Specialist Time Calculation (min) (ACUTE ONLY) 20 min   Pt received in bed, agreeable to mobility. Independently able to stand and ambulate with no AD. Tolerated well, asx throughout. Left pt in chair, all needs met.  Glinda Lapping Mobility Specialist Please contact via Special educational needs teacher or  Rehab office at [phone]

## 2023-09-15 NOTE — Progress Notes (Signed)
 [facility] Progress Note    Assessment/ Plan:   # Acute hypoxic respiratory failure/pneumonia/pulmonary edema: On antibiotics for pneumonia with supplemental oxygen also likely will pulmonary edema.  HD yesterday. Has had 2 rx this week so far, next rx Saturday   # ESRD: outpatient HD-[facility]. On HD TTS, received dialysis on 5/12 for hypoxia and shortness of breath.  HD x 2 this week so far, can get back on regular schedule for Saturday.   # Hypertension: Blood pressure elevated.  On regimen with hydralazine  once daily.  Change hydralazine  to 50 mg twice daily and add losartan 50 mg daily.  Continue other medications as prescribed   # Anemia of ESRD: Hemoglobin slightly better at 8.2 today.  Continue to monitor and consider ESA.  Follow-up iron  studies   # Metabolic Bone Disease: On sevelamer . Po4 acceptable.  Calcium  corrects near normal     Subjective:    Seen in room.  Ended up going to [facility] after all for HD yesterday.  Feels better, still on O2 but improving.    Objective:   BP (!) 169/46   Pulse 83   Temp 98.9 F (37.2 C) (Oral)   Resp 15   Ht 4\' 11"  (1.499 m)   Wt 41.5 kg   SpO2 98%   BMI 18.48 kg/m   Intake/Output Summary (Last 24 hours) at 09/15/2023 0944 Last data filed at 09/15/2023 0800 Gross per 24 hour  Intake 600 ml  Output 2400 ml  Net -1800 ml   Weight change:   Physical Exam: Gen: NAD, sitting in bed CVS: normal rate, no edema Resp: bilateral chest rise with no increased work of breathing Abd: soft, nondistended Ext: Warm and well-perfused Neuro: awake, alert Dialysis access: LUE AVG +b/t  Imaging: No results found.   Labs: BMET Recent Labs  Lab 09/08/23 1222 09/09/23 0344 09/10/23 0339 09/11/23 0407 09/12/23 0400 09/13/23 0356 09/14/23 0422  NA 135 140 140 130* 131* 130* 130*  K 3.6 4.2 3.6 3.9 4.2 4.3 3.9  CL 99 103 102 95* 99 92* 93*  CO2 25 23 22 25  21* 26 23  GLUCOSE 149* 126* 114* 108* 96 94 82  BUN 21  26* 38* 26* 43* 25* 54*  CREATININE 5.15* 5.89* 7.06* 4.29* 5.93* 3.58* 5.56*  CALCIUM  8.4* 8.1* 8.1* 8.0* 7.8* 8.0* 7.4*  PHOS  --   --  4.8* 3.9 5.5* 4.3 6.3*   CBC Recent Labs  Lab 09/08/23 1222 09/09/23 0344 09/10/23 0339 09/11/23 0407 09/13/23 0356 09/14/23 0422  WBC 12.6*   < > 15.4* 14.9* 15.3* 16.2*  NEUTROABS 11.1*  --   --   --   --   --   HGB 8.9*   < > 7.6* 7.8* 8.2* 9.1*  HCT 27.9*   < > 24.1* 24.7* 24.5* 26.4*  MCV 89.4   < > 88.6 87.3 84.8 85.4  PLT 462*   < > 439* 403* 393 371   < > = values in this interval not displayed.    Medications:     amLODipine   10 mg Oral Daily   Chlorhexidine  Gluconate Cloth  6 each Topical Q0600   ferrous sulfate   325 mg Oral Once per day on Monday Wednesday Friday   guaiFENesin   600 mg Oral BID   heparin   5,000 Units Subcutaneous Q8H   hydrALAZINE   50 mg Oral BID   ipratropium-albuterol   3 mL Nebulization BID   losartan  50 mg Oral Daily  metoprolol  succinate  50 mg Oral Daily   multivitamin  1 tablet Oral QHS   pantoprazole   40 mg Oral BID   predniSONE   10 mg Oral Q breakfast   saccharomyces boulardii  250 mg Oral BID   sevelamer  carbonate  2,400 mg Oral TID WC       [facility] 09/15/2023, 9:44 AM

## 2023-09-15 NOTE — Care Management Important Message (Signed)
 Important Message  Patient Details  Name: Sheri Cooper MRN: [id number] Date of Birth: April 15, 1951   Important Message Given:  Yes - Medicare IM     Quavis Klutz L Nayson Traweek 09/15/2023, 12:21 PM

## 2023-09-15 NOTE — Plan of Care (Signed)
  Problem: Health Behavior/Discharge Planning: Goal: Ability to manage health-related needs will improve Outcome: Progressing   Problem: Clinical Measurements: Goal: Ability to maintain clinical measurements within normal limits will improve Outcome: Progressing Goal: Will remain free from infection Outcome: Progressing Goal: Diagnostic test results will improve Outcome: Progressing   Problem: Activity: Goal: Risk for activity intolerance will decrease Outcome: Progressing   Problem: Coping: Goal: Level of anxiety will decrease Outcome: Progressing   Problem: Elimination: Goal: Will not experience complications related to bowel motility Outcome: Progressing Goal: Will not experience complications related to urinary retention Outcome: Progressing   Problem: Pain Managment: Goal: General experience of comfort will improve and/or be controlled Outcome: Progressing   Problem: Safety: Goal: Ability to remain free from injury will improve Outcome: Progressing   Problem: Skin Integrity: Goal: Risk for impaired skin integrity will decrease Outcome: Progressing   Problem: Activity: Goal: Ability to tolerate increased activity will improve Outcome: Progressing   Problem: Clinical Measurements: Goal: Ability to maintain a body temperature in the normal range will improve Outcome: Progressing   Problem: Respiratory: Goal: Ability to maintain adequate ventilation will improve Outcome: Progressing Goal: Ability to maintain a clear airway will improve Outcome: Progressing

## 2023-09-16 NOTE — Progress Notes (Signed)
 [facility] Liaison Note  09/16/2023  Sheri Cooper [birth year] [id number]  Location: RN Hospital Liaison screened the patient remotely at [facility].  Insurance: [employer]   [name] is a 73 y.o. female who is a Primary Care Patient of Leesa Pulling, MD [facility]. The patient was screened for 30 day readmission hospitalization with noted extreme risk score for unplanned readmission risk with 2 IP in 6 months.  The patient was assessed for potential Care Management service needs for post hospital transition for care coordination. Review of patient's electronic medical record reveals patient was admitted with Hypoxia.  This provider office is not partnering with [facility] for care management services.    [facility] Care Management/Population Health does not replace or interfere with any arrangements made by the Inpatient Transition of Care team.   For questions contact:   Lilla Reichert, RN, [facility] Liaison [place]   [facility], Population Health Office Hours MTWF  8:00 am-6:00 pm Direct Dial : [phone] [email or web address]

## 2023-09-17 DIAGNOSIS — Z992 Dependence on renal dialysis: Secondary | ICD-10-CM | POA: Diagnosis not present

## 2023-09-17 DIAGNOSIS — N186 End stage renal disease: Secondary | ICD-10-CM | POA: Diagnosis not present

## 2023-09-20 DIAGNOSIS — Z992 Dependence on renal dialysis: Secondary | ICD-10-CM | POA: Diagnosis not present

## 2023-09-20 DIAGNOSIS — N186 End stage renal disease: Secondary | ICD-10-CM | POA: Diagnosis not present

## 2023-09-21 DIAGNOSIS — E782 Mixed hyperlipidemia: Secondary | ICD-10-CM | POA: Diagnosis not present

## 2023-09-21 DIAGNOSIS — D638 Anemia in other chronic diseases classified elsewhere: Secondary | ICD-10-CM | POA: Diagnosis not present

## 2023-09-22 DIAGNOSIS — Z992 Dependence on renal dialysis: Secondary | ICD-10-CM | POA: Diagnosis not present

## 2023-09-22 DIAGNOSIS — N186 End stage renal disease: Secondary | ICD-10-CM | POA: Diagnosis not present

## 2023-09-24 DIAGNOSIS — N186 End stage renal disease: Secondary | ICD-10-CM | POA: Diagnosis not present

## 2023-09-24 DIAGNOSIS — Z992 Dependence on renal dialysis: Secondary | ICD-10-CM | POA: Diagnosis not present

## 2023-09-26 ENCOUNTER — Emergency Department (HOSPITAL_COMMUNITY)

## 2023-09-26 ENCOUNTER — Inpatient Hospital Stay (HOSPITAL_COMMUNITY)
Admission: EM | Admit: 2023-09-26 | Discharge: 2023-09-28 | DRG: 640 | Disposition: A | Discharge status: Home / Self Care | Attending: Internal Medicine | Admitting: Internal Medicine

## 2023-09-26 ENCOUNTER — Other Ambulatory Visit: Payer: Self-pay

## 2023-09-26 ENCOUNTER — Encounter (HOSPITAL_COMMUNITY): Payer: Self-pay | Admitting: Emergency Medicine

## 2023-09-26 DIAGNOSIS — Z79899 Other long term (current) drug therapy: Secondary | ICD-10-CM | POA: Diagnosis not present

## 2023-09-26 DIAGNOSIS — N2581 Secondary hyperparathyroidism of renal origin: Secondary | ICD-10-CM | POA: Diagnosis not present

## 2023-09-26 DIAGNOSIS — R64 Cachexia: Secondary | ICD-10-CM | POA: Diagnosis not present

## 2023-09-26 DIAGNOSIS — E8770 Fluid overload, unspecified: Secondary | ICD-10-CM | POA: Diagnosis not present

## 2023-09-26 DIAGNOSIS — I120 Hypertensive chronic kidney disease with stage 5 chronic kidney disease or end stage renal disease: Secondary | ICD-10-CM | POA: Diagnosis not present

## 2023-09-26 DIAGNOSIS — E875 Hyperkalemia: Secondary | ICD-10-CM | POA: Insufficient documentation

## 2023-09-26 DIAGNOSIS — R918 Other nonspecific abnormal finding of lung field: Secondary | ICD-10-CM | POA: Diagnosis not present

## 2023-09-26 DIAGNOSIS — J441 Chronic obstructive pulmonary disease with (acute) exacerbation: Secondary | ICD-10-CM | POA: Diagnosis not present

## 2023-09-26 DIAGNOSIS — Z992 Dependence on renal dialysis: Secondary | ICD-10-CM | POA: Diagnosis present

## 2023-09-26 DIAGNOSIS — Z1152 Encounter for screening for COVID-19: Secondary | ICD-10-CM | POA: Diagnosis not present

## 2023-09-26 DIAGNOSIS — D631 Anemia in chronic kidney disease: Secondary | ICD-10-CM | POA: Diagnosis not present

## 2023-09-26 DIAGNOSIS — Z681 Body mass index (BMI) 19 or less, adult: Secondary | ICD-10-CM | POA: Diagnosis not present

## 2023-09-26 DIAGNOSIS — R0602 Shortness of breath: Secondary | ICD-10-CM | POA: Diagnosis not present

## 2023-09-26 DIAGNOSIS — I160 Hypertensive urgency: Secondary | ICD-10-CM | POA: Diagnosis present

## 2023-09-26 DIAGNOSIS — E441 Mild protein-calorie malnutrition: Secondary | ICD-10-CM | POA: Diagnosis not present

## 2023-09-26 DIAGNOSIS — Z87891 Personal history of nicotine dependence: Secondary | ICD-10-CM

## 2023-09-26 DIAGNOSIS — R079 Chest pain, unspecified: Secondary | ICD-10-CM | POA: Diagnosis not present

## 2023-09-26 DIAGNOSIS — K219 Gastro-esophageal reflux disease without esophagitis: Secondary | ICD-10-CM | POA: Diagnosis not present

## 2023-09-26 DIAGNOSIS — J90 Pleural effusion, not elsewhere classified: Secondary | ICD-10-CM | POA: Diagnosis present

## 2023-09-26 DIAGNOSIS — E8779 Other fluid overload: Secondary | ICD-10-CM | POA: Diagnosis not present

## 2023-09-26 DIAGNOSIS — K279 Peptic ulcer, site unspecified, unspecified as acute or chronic, without hemorrhage or perforation: Secondary | ICD-10-CM | POA: Diagnosis not present

## 2023-09-26 DIAGNOSIS — N186 End stage renal disease: Secondary | ICD-10-CM | POA: Diagnosis not present

## 2023-09-26 DIAGNOSIS — I5033 Acute on chronic diastolic (congestive) heart failure: Secondary | ICD-10-CM | POA: Diagnosis not present

## 2023-09-26 DIAGNOSIS — Z8701 Personal history of pneumonia (recurrent): Secondary | ICD-10-CM

## 2023-09-26 DIAGNOSIS — R6889 Other general symptoms and signs: Secondary | ICD-10-CM | POA: Diagnosis not present

## 2023-09-26 DIAGNOSIS — M069 Rheumatoid arthritis, unspecified: Secondary | ICD-10-CM | POA: Diagnosis not present

## 2023-09-26 DIAGNOSIS — R0902 Hypoxemia: Secondary | ICD-10-CM | POA: Diagnosis not present

## 2023-09-26 LAB — I-STAT CHEM 8, ED
BUN: 34 mg/dL — ABNORMAL HIGH (ref 8–23)
Calcium, Ion: 1.03 mmol/L — ABNORMAL LOW (ref 1.15–1.40)
Chloride: 110 mmol/L (ref 98–111)
Creatinine, Ser: 6.6 mg/dL — ABNORMAL HIGH (ref 0.44–1.00)
Glucose, Bld: 85 mg/dL (ref 70–99)
HCT: 28 % — ABNORMAL LOW (ref 36.0–46.0)
Hemoglobin: 9.5 g/dL — ABNORMAL LOW (ref 12.0–15.0)
Potassium: 6.2 mmol/L — ABNORMAL HIGH (ref 3.5–5.1)
Sodium: 140 mmol/L (ref 135–145)
TCO2: 20 mmol/L — ABNORMAL LOW (ref 22–32)

## 2023-09-26 LAB — CBC WITH DIFFERENTIAL/PLATELET
Abs Immature Granulocytes: 0.07 10*3/uL (ref 0.00–0.07)
Basophils Absolute: 0.1 10*3/uL (ref 0.0–0.1)
Basophils Relative: 1 %
Eosinophils Absolute: 0.2 10*3/uL (ref 0.0–0.5)
Eosinophils Relative: 1 %
HCT: 30.8 % — ABNORMAL LOW (ref 36.0–46.0)
Hemoglobin: 9.1 g/dL — ABNORMAL LOW (ref 12.0–15.0)
Immature Granulocytes: 1 %
Lymphocytes Relative: 18 %
Lymphs Abs: 2.4 10*3/uL (ref 0.7–4.0)
MCH: 28.8 pg (ref 26.0–34.0)
MCHC: 29.5 g/dL — ABNORMAL LOW (ref 30.0–36.0)
MCV: 97.5 fL (ref 80.0–100.0)
Monocytes Absolute: 0.7 10*3/uL (ref 0.1–1.0)
Monocytes Relative: 5 %
Neutro Abs: 10.1 10*3/uL — ABNORMAL HIGH (ref 1.7–7.7)
Neutrophils Relative %: 74 %
Platelets: 356 10*3/uL (ref 150–400)
RBC: 3.16 MIL/uL — ABNORMAL LOW (ref 3.87–5.11)
RDW: 20.6 % — ABNORMAL HIGH (ref 11.5–15.5)
WBC: 13.5 10*3/uL — ABNORMAL HIGH (ref 4.0–10.5)
nRBC: 0 % (ref 0.0–0.2)

## 2023-09-26 LAB — RESP PANEL BY RT-PCR (RSV, FLU A&B, COVID)  RVPGX2
Influenza A by PCR: NEGATIVE
Influenza B by PCR: NEGATIVE
Resp Syncytial Virus by PCR: NEGATIVE
SARS Coronavirus 2 by RT PCR: NEGATIVE

## 2023-09-26 MED ORDER — SODIUM BICARBONATE 8.4 % IV SOLN
Freq: Once | INTRAVENOUS | Status: DC
  Filled 2023-09-26: qty 1000

## 2023-09-26 MED ORDER — ALBUTEROL SULFATE (2.5 MG/3ML) 0.083% IN NEBU: 2.5000 mg | INHALATION_SOLUTION | RESPIRATORY_TRACT | Status: DC | PRN

## 2023-09-26 MED ORDER — SODIUM ZIRCONIUM CYCLOSILICATE 5 G PO PACK
10.0000 g | PACK | Freq: Once | ORAL | Status: AC
  Administered 2023-09-26: 10 g via ORAL
  Filled 2023-09-26: qty 2

## 2023-09-26 MED ORDER — FUROSEMIDE 10 MG/ML IJ SOLN
40.0000 mg | Freq: Once | INTRAMUSCULAR | Status: AC
  Administered 2023-09-26: 40 mg via INTRAVENOUS
  Filled 2023-09-26: qty 4

## 2023-09-26 MED ORDER — DEXTROSE 50 % IV SOLN
1.0000 | Freq: Once | INTRAVENOUS | Status: AC
  Administered 2023-09-26: 50 mL via INTRAVENOUS
  Filled 2023-09-26: qty 50

## 2023-09-26 MED ORDER — MELATONIN 3 MG PO TABS
6.0000 mg | ORAL_TABLET | Freq: Every evening | ORAL | Status: DC | PRN
  Administered 2023-09-27: 6 mg via ORAL
  Filled 2023-09-26: qty 2

## 2023-09-26 MED ORDER — ACETAMINOPHEN 500 MG PO TABS
1000.0000 mg | ORAL_TABLET | Freq: Four times a day (QID) | ORAL | Status: DC | PRN
  Administered 2023-09-27: 1000 mg via ORAL
  Filled 2023-09-26: qty 2

## 2023-09-26 MED ORDER — ALBUTEROL SULFATE (2.5 MG/3ML) 0.083% IN NEBU
10.0000 mg | INHALATION_SOLUTION | Freq: Once | RESPIRATORY_TRACT | Status: AC
  Administered 2023-09-26: 10 mg via RESPIRATORY_TRACT
  Filled 2023-09-26: qty 12

## 2023-09-26 MED ORDER — POLYETHYLENE GLYCOL 3350 17 G PO PACK: 17.0000 g | PACK | Freq: Every day | ORAL | Status: DC | PRN

## 2023-09-26 MED ORDER — INSULIN ASPART 100 UNIT/ML IV SOLN
5.0000 [IU] | Freq: Once | INTRAVENOUS | Status: AC
  Administered 2023-09-26: 5 [IU] via INTRAVENOUS

## 2023-09-26 MED ORDER — CALCIUM GLUCONATE 10 % IV SOLN
1.0000 g | Freq: Once | INTRAVENOUS | Status: AC
  Administered 2023-09-26: 1 g via INTRAVENOUS
  Filled 2023-09-26: qty 10

## 2023-09-26 MED ORDER — CHLORHEXIDINE GLUCONATE CLOTH 2 % EX PADS
6.0000 | MEDICATED_PAD | Freq: Every day | CUTANEOUS | Status: DC
  Administered 2023-09-27 – 2023-09-28 (×2): 6 via TOPICAL

## 2023-09-26 MED ORDER — ONDANSETRON HCL 4 MG/2ML IJ SOLN
4.0000 mg | Freq: Once | INTRAMUSCULAR | Status: AC
  Administered 2023-09-26: 4 mg via INTRAVENOUS
  Filled 2023-09-26: qty 2

## 2023-09-26 MED ORDER — HEPARIN SODIUM (PORCINE) 5000 UNIT/ML IJ SOLN
5000.0000 [IU] | Freq: Three times a day (TID) | INTRAMUSCULAR | Status: DC
  Administered 2023-09-27 – 2023-09-28 (×4): 5000 [IU] via SUBCUTANEOUS
  Filled 2023-09-26 (×4): qty 1

## 2023-09-26 MED ORDER — SODIUM CHLORIDE 0.9% FLUSH
3.0000 mL | Freq: Two times a day (BID) | INTRAVENOUS | Status: DC
  Administered 2023-09-27 – 2023-09-28 (×4): 3 mL via INTRAVENOUS

## 2023-09-26 MED ORDER — SODIUM BICARBONATE 8.4 % IV SOLN
50.0000 meq | Freq: Once | INTRAVENOUS | Status: AC
  Administered 2023-09-26: 50 meq via INTRAVENOUS
  Filled 2023-09-26: qty 50

## 2023-09-26 NOTE — ED Provider Notes (Signed)
 [facility] [place] EMERGENCY DEPARTMENT AT [facility] Provider Note   CSN: [id number] Arrival date & time: 09/26/23  2218     History  Chief Complaint  Patient presents with   Shortness of Breath    Sheri Cooper is a 73 y.o. female.  The history is provided by the patient, medical records and the EMS personnel. No language interpreter was used.  Shortness of Breath    73 year old female with history of end-stage renal disease currently on Tuesday Thursday Saturday dialysis, COPD, rheumatoid arthritis, hypertension, presenting today with complaints of shortness of breath.  Patient states despite having 3 and half hours of dialysis session 3 days ago, patient endorsed progressive worsening shortness of breath, cough, and occasional episodes of vomiting.  She feels fatigued from shortness of breath and she felt she is having difficulty with her breathing.  Patient states she has had recurrent pneumonia at least 4 times this year.  She also reports multiple hospital admissions for similar symptoms.  She is not on home O2.  She denies any other changes.  Home Medications Prior to Admission medications   Medication Sig Start Date End Date Taking? Authorizing Provider  amLODipine  (NORVASC ) 10 MG tablet Take 10 mg by mouth daily.    [provider]  b complex-vitamin c-folic acid  (NEPHRO-VITE) 0.8 MG TABS tablet Take 1 tablet by mouth at bedtime.    [provider]  ferrous sulfate  325 (65 FE) MG EC tablet Take 325 mg by mouth 3 (three) times a week.    [provider]  hydrALAZINE  (APRESOLINE ) 50 MG tablet Take 1 tablet (50 mg total) by mouth 2 (two) times daily. 09/15/23   Demaris Fillers, MD  losartan  (COZAAR ) 50 MG tablet Take 1 tablet (50 mg total) by mouth daily. 09/16/23   Demaris Fillers, MD  metoprolol  succinate (TOPROL -XL) 50 MG 24 hr tablet Take 50 mg by mouth daily. 10/15/22   [provider]  pantoprazole  (PROTONIX ) 40 MG tablet Take 1 tablet (40  mg total) by mouth 2 (two) times daily. 06/05/22 09/08/23  Justina Oman, MD  sevelamer  carbonate (RENVELA ) 800 MG tablet Take 2,400 mg by mouth 3 (three) times daily with meals.    [provider]      Allergies    Patient has no known allergies.    Review of Systems   Review of Systems  Respiratory:  Positive for shortness of breath.   All other systems reviewed and are negative.   Physical Exam Updated Vital Signs BP (!) 197/87   Pulse (!) 102   Temp 98.6 F (37 C) (Oral)   Resp (!) 22   Ht 4\' 11"  (1.499 m)   Wt 41 kg   SpO2 93%   BMI 18.26 kg/m  Physical Exam Vitals and nursing note reviewed.  Constitutional:      General: She is not in acute distress.    Appearance: She is well-developed.     Comments: Patient appears cachectic, having moderate respiratory discomfort.  She is also holding an emesis bag.  HENT:     Head: Atraumatic.  Eyes:     Conjunctiva/sclera: Conjunctivae normal.  Cardiovascular:     Rate and Rhythm: Tachycardia present.  Pulmonary:     Effort: Respiratory distress present.     Breath sounds: Rales present.  Abdominal:     Palpations: Abdomen is soft.     Tenderness: There is no abdominal tenderness.  Musculoskeletal:     Cervical back: Neck supple.  Right lower leg: No edema.     Left lower leg: No edema.  Skin:    Findings: No rash.     Comments: AV fistula to left upper extremity with palpable bruit and thrill  Neurological:     Mental Status: She is alert. Mental status is at baseline.  Psychiatric:        Mood and Affect: Mood normal.     ED Results / Procedures / Treatments   Labs (all labs ordered are listed, but only abnormal results are displayed) Labs Reviewed  CBC WITH DIFFERENTIAL/PLATELET - Abnormal; Notable for the following components:      Result Value   WBC 13.5 (*)    RBC 3.16 (*)    Hemoglobin 9.1 (*)    HCT 30.8 (*)    MCHC 29.5 (*)    RDW 20.6 (*)    Neutro Abs 10.1 (*)    All other components  within normal limits  I-STAT CHEM 8, ED - Abnormal; Notable for the following components:   Potassium 6.2 (*)    BUN 34 (*)    Creatinine, Ser 6.60 (*)    Calcium , Ion 1.03 (*)    TCO2 20 (*)    Hemoglobin 9.5 (*)    HCT 28.0 (*)    All other components within normal limits  RESP PANEL BY RT-PCR (RSV, FLU A&B, COVID)  [id number]  HEPATITIS B SURFACE ANTIGEN  HEPATITIS B SURFACE ANTIBODY, QUANTITATIVE    EKG None ED ECG REPORT   Date: 09/26/2023  Rate: 91  Rhythm: normal sinus rhythm  QRS Axis: normal  Intervals: QT prolonged  ST/T Wave abnormalities: nonspecific ST changes  Conduction Disutrbances:none  Narrative Interpretation:   Old EKG Reviewed: unchanged  I have personally reviewed the EKG tracing and agree with the computerized printout as noted.   Radiology DG Chest Port 1 View Result Date: 09/26/2023 CLINICAL DATA:  Severe shortness of breath, chest pain, cough EXAM: PORTABLE CHEST 1 VIEW COMPARISON:  09/13/2023 FINDINGS: Stable cardiomegaly. Aortic atherosclerotic calcification. Left greater than right pleural effusions and associated airspace opacities which may represent atelectasis or pneumonia. No pneumothorax. Surgical clips left chest wall. IMPRESSION: Left greater than right pleural effusions and associated airspace opacities which may represent atelectasis or pneumonia. Findings are similar to 09/13/2023. Electronically Signed   By: Rozell Cornet M.D.   On: 09/26/2023 22:53    Procedures .Critical Care  Performed by: Debbra Fairy, PA-C Authorized by: Debbra Fairy, PA-C   Critical care provider statement:    Critical care time (minutes):  30   Critical care was time spent personally by me on the following activities:  Development of treatment plan with patient or surrogate, discussions with consultants, evaluation of patient's response to treatment, examination of patient, ordering and review of laboratory studies, ordering and review of radiographic studies,  ordering and performing treatments and interventions, pulse oximetry, re-evaluation of patient's condition and review of old charts     Medications Ordered in ED Medications  furosemide  (LASIX ) injection 40 mg (has no administration in time range)  sodium zirconium cyclosilicate (LOKELMA) packet 10 g (has no administration in time range)  calcium  gluconate inj 10% (1 g) URGENT USE ONLY! (has no administration in time range)  albuterol  (PROVENTIL ) (2.5 MG/3ML) 0.083% nebulizer solution 10 mg (has no administration in time range)  insulin  aspart (novoLOG ) injection 5 Units (has no administration in time range)    And  dextrose  50 % solution 50 mL (has no administration in time  range)  Chlorhexidine  Gluconate Cloth 2 % PADS 6 each (has no administration in time range)  sodium bicarbonate  injection 50 mEq (has no administration in time range)  ondansetron  (ZOFRAN ) injection 4 mg (4 mg Intravenous Given 09/26/23 2300)    ED Course/ Medical Decision Making/ A&P                                 Medical Decision Making Amount and/or Complexity of Data Reviewed Labs: ordered. Radiology: ordered.   BP (!) 197/87   Pulse (!) 102   Temp 98.6 F (37 C) (Oral)   Resp (!) 22   Ht 4\' 11"  (1.499 m)   Wt 41 kg   SpO2 93%   BMI 18.26 kg/m   38:38 PM 73 year old female with history of end-stage renal disease currently on Tuesday Thursday Saturday dialysis, COPD, rheumatoid arthritis, hypertension, presenting today with complaints of shortness of breath.  Patient states despite having 3 and half hours of dialysis session 3 days ago, patient endorsed progressive worsening shortness of breath, cough, and occasional episodes of vomiting.  She feels fatigued from shortness of breath and she felt she is having difficulty with her breathing.  Patient states she has had recurrent pneumonia at least 4 times this year.  She also reports multiple hospital admissions for similar symptoms.  She is not on home  O2.  She denies any other changes.  On exam, this is a cachectic elderly female sitting up in bed, appears nauseous holding emesis bag and having some difficulty breathing.  Lung with diminished lung sounds and crackles heard at the lung bases.  She does not have any peripheral edema to her lower extremities.  Vital signs notable for an elevated blood pressure of 197/87.  She is satting at 94% on 2 L of O2.  -Labs ordered, independently viewed and interpreted by me.  Labs remarkable for potassium of 6.2 with EKG changes.  White count elevated at 13.5 improved from prior -The patient was maintained on a cardiac monitor.  I personally viewed and interpreted the cardiac monitored which showed an underlying rhythm of: Sinus tachycardia -Imaging independently viewed and interpreted by me and I agree with radiologist's interpretation.  Result remarkable for pleural effusions similar to prior x-ray. -This patient presents to the ED for concern of SOB, this involves an extensive number of treatment options, and is a complaint that carries with it a high risk of complications and morbidity.  The differential diagnosis includes pleural effusion, pna, pe, ptx, MI -Co morbidities that complicate the patient evaluation includes ESRD, COPD, RA, HTN -Treatment includes lokelma, lasix , insulin , albuterol , calcium  gluconate, D10, zofran , O2 -Reevaluation of the patient after these medicines showed that the patient improved -PCP office notes or outside notes reviewed -Discussion with attending Dr. Bryna Car.  I have consulted nephrologist Dr. Edson Graces who recommend admission and pt will need treatment for her hyperkalemia.  Dialysis tomorrow.  I have also consulted with [facility] Dr. Tisha Forget who will admit pt -Escalation to admission/observation considered: patient agreed with admission.         Final Clinical Impression(s) / ED Diagnoses Final diagnoses:  Hyperkalemia  Pleural effusion  Hypoxia   Hypertensive urgency    Rx / DC Orders ED Discharge Orders     None         Debbra Fairy, PA-C 09/26/23 2339    Cheyenne Cotta, MD 09/28/23 [phone]

## 2023-09-26 NOTE — ED Notes (Signed)
 Portable xray at bedside.

## 2023-09-26 NOTE — Progress Notes (Addendum)
 Brief nephrology note: ESRD on HD w/SOB. CXR and labs reviewed. K 6.2, treat medically and HD ordered. D/w ER provider and dialysis nurse. Will follow.

## 2023-09-26 NOTE — ED Triage Notes (Signed)
 Pt c/o sob x 2 days and is supposed to have dialysis tomorrow.

## 2023-09-27 ENCOUNTER — Observation Stay (HOSPITAL_COMMUNITY)

## 2023-09-27 ENCOUNTER — Other Ambulatory Visit (HOSPITAL_COMMUNITY): Payer: Self-pay | Admitting: *Deleted

## 2023-09-27 DIAGNOSIS — I5033 Acute on chronic diastolic (congestive) heart failure: Secondary | ICD-10-CM | POA: Diagnosis not present

## 2023-09-27 DIAGNOSIS — Z1152 Encounter for screening for COVID-19: Secondary | ICD-10-CM | POA: Diagnosis not present

## 2023-09-27 DIAGNOSIS — Z681 Body mass index (BMI) 19 or less, adult: Secondary | ICD-10-CM | POA: Diagnosis not present

## 2023-09-27 DIAGNOSIS — K279 Peptic ulcer, site unspecified, unspecified as acute or chronic, without hemorrhage or perforation: Secondary | ICD-10-CM | POA: Diagnosis present

## 2023-09-27 DIAGNOSIS — R0902 Hypoxemia: Secondary | ICD-10-CM | POA: Diagnosis present

## 2023-09-27 DIAGNOSIS — Z8701 Personal history of pneumonia (recurrent): Secondary | ICD-10-CM | POA: Diagnosis not present

## 2023-09-27 DIAGNOSIS — D631 Anemia in chronic kidney disease: Secondary | ICD-10-CM | POA: Diagnosis present

## 2023-09-27 DIAGNOSIS — E441 Mild protein-calorie malnutrition: Secondary | ICD-10-CM | POA: Diagnosis present

## 2023-09-27 DIAGNOSIS — M069 Rheumatoid arthritis, unspecified: Secondary | ICD-10-CM | POA: Diagnosis present

## 2023-09-27 DIAGNOSIS — E875 Hyperkalemia: Secondary | ICD-10-CM | POA: Insufficient documentation

## 2023-09-27 DIAGNOSIS — N186 End stage renal disease: Secondary | ICD-10-CM

## 2023-09-27 DIAGNOSIS — E8770 Fluid overload, unspecified: Secondary | ICD-10-CM | POA: Diagnosis present

## 2023-09-27 DIAGNOSIS — J441 Chronic obstructive pulmonary disease with (acute) exacerbation: Secondary | ICD-10-CM

## 2023-09-27 DIAGNOSIS — E8779 Other fluid overload: Secondary | ICD-10-CM | POA: Diagnosis not present

## 2023-09-27 DIAGNOSIS — R64 Cachexia: Secondary | ICD-10-CM | POA: Diagnosis present

## 2023-09-27 DIAGNOSIS — I160 Hypertensive urgency: Secondary | ICD-10-CM | POA: Diagnosis present

## 2023-09-27 DIAGNOSIS — N2581 Secondary hyperparathyroidism of renal origin: Secondary | ICD-10-CM | POA: Diagnosis not present

## 2023-09-27 DIAGNOSIS — Z87891 Personal history of nicotine dependence: Secondary | ICD-10-CM | POA: Diagnosis not present

## 2023-09-27 DIAGNOSIS — Z79899 Other long term (current) drug therapy: Secondary | ICD-10-CM | POA: Diagnosis not present

## 2023-09-27 DIAGNOSIS — I120 Hypertensive chronic kidney disease with stage 5 chronic kidney disease or end stage renal disease: Secondary | ICD-10-CM | POA: Diagnosis not present

## 2023-09-27 DIAGNOSIS — Z992 Dependence on renal dialysis: Secondary | ICD-10-CM | POA: Diagnosis not present

## 2023-09-27 DIAGNOSIS — K219 Gastro-esophageal reflux disease without esophagitis: Secondary | ICD-10-CM | POA: Diagnosis present

## 2023-09-27 DIAGNOSIS — J90 Pleural effusion, not elsewhere classified: Secondary | ICD-10-CM | POA: Diagnosis present

## 2023-09-27 LAB — ECHOCARDIOGRAM COMPLETE
AR max vel: 1.08 cm2
AV Area VTI: 1.17 cm2
AV Area mean vel: 1.18 cm2
AV Mean grad: 8 mmHg
AV Peak grad: 16.2 mmHg
Ao pk vel: 2.01 m/s
Area-P 1/2: 3.31 cm2
Height: 59 in
S' Lateral: 3 cm
Weight: 1446.22 [oz_av]

## 2023-09-27 LAB — BASIC METABOLIC PANEL WITH GFR
Anion gap: 10 (ref 5–15)
BUN: 38 mg/dL — ABNORMAL HIGH (ref 8–23)
CO2: 23 mmol/L (ref 22–32)
Calcium: 8.4 mg/dL — ABNORMAL LOW (ref 8.9–10.3)
Chloride: 106 mmol/L (ref 98–111)
Creatinine, Ser: 6.29 mg/dL — ABNORMAL HIGH (ref 0.44–1.00)
GFR, Estimated: 7 mL/min — ABNORMAL LOW (ref >60)
Glucose, Bld: 124 mg/dL — ABNORMAL HIGH (ref 70–99)
Potassium: 4.7 mmol/L (ref 3.5–5.1)
Sodium: 139 mmol/L (ref 135–145)

## 2023-09-27 LAB — CBC
HCT: 26.5 % — ABNORMAL LOW (ref 36.0–46.0)
Hemoglobin: 8.6 g/dL — ABNORMAL LOW (ref 12.0–15.0)
MCH: 29.7 pg (ref 26.0–34.0)
MCHC: 32.5 g/dL (ref 30.0–36.0)
MCV: 91.4 fL (ref 80.0–100.0)
Platelets: 304 10*3/uL (ref 150–400)
RBC: 2.9 MIL/uL — ABNORMAL LOW (ref 3.87–5.11)
RDW: 20.1 % — ABNORMAL HIGH (ref 11.5–15.5)
WBC: 12.5 10*3/uL — ABNORMAL HIGH (ref 4.0–10.5)
nRBC: 0 % (ref 0.0–0.2)

## 2023-09-27 LAB — HEPATITIS B SURFACE ANTIGEN: Hepatitis B Surface Ag: NONREACTIVE

## 2023-09-27 LAB — PHOSPHORUS: Phosphorus: 5.3 mg/dL — ABNORMAL HIGH (ref 2.5–4.6)

## 2023-09-27 LAB — PROCALCITONIN: Procalcitonin: 0.27 ng/mL

## 2023-09-27 LAB — MAGNESIUM: Magnesium: 2.1 mg/dL (ref 1.7–2.4)

## 2023-09-27 MED ORDER — LOSARTAN POTASSIUM 50 MG PO TABS
50.0000 mg | ORAL_TABLET | Freq: Every day | ORAL | Status: DC
  Administered 2023-09-27 – 2023-09-28 (×2): 50 mg via ORAL
  Filled 2023-09-27 (×2): qty 1

## 2023-09-27 MED ORDER — SODIUM ZIRCONIUM CYCLOSILICATE 10 G PO PACK
10.0000 g | PACK | Freq: Every day | ORAL | Status: DC
  Filled 2023-09-27: qty 1

## 2023-09-27 MED ORDER — IPRATROPIUM-ALBUTEROL 0.5-2.5 (3) MG/3ML IN SOLN
3.0000 mL | Freq: Four times a day (QID) | RESPIRATORY_TRACT | Status: DC
  Administered 2023-09-27 – 2023-09-28 (×4): 3 mL via RESPIRATORY_TRACT
  Filled 2023-09-27 (×5): qty 3

## 2023-09-27 MED ORDER — LIDOCAINE HCL (PF) 1 % IJ SOLN: 5.0000 mL | INTRAMUSCULAR | Status: DC | PRN

## 2023-09-27 MED ORDER — METHYLPREDNISOLONE SODIUM SUCC 125 MG IJ SOLR
60.0000 mg | Freq: Two times a day (BID) | INTRAMUSCULAR | Status: DC
  Administered 2023-09-27 – 2023-09-28 (×4): 60 mg via INTRAVENOUS
  Filled 2023-09-27 (×4): qty 2

## 2023-09-27 MED ORDER — PANTOPRAZOLE SODIUM 40 MG PO TBEC
40.0000 mg | DELAYED_RELEASE_TABLET | Freq: Two times a day (BID) | ORAL | Status: DC
  Administered 2023-09-27 – 2023-09-28 (×4): 40 mg via ORAL
  Filled 2023-09-27 (×4): qty 1

## 2023-09-27 MED ORDER — LIDOCAINE-PRILOCAINE 2.5-2.5 % EX CREA: 1.0000 | TOPICAL_CREAM | CUTANEOUS | Status: DC | PRN

## 2023-09-27 MED ORDER — HYDRALAZINE HCL 50 MG PO TABS
50.0000 mg | ORAL_TABLET | Freq: Two times a day (BID) | ORAL | Status: DC
  Administered 2023-09-27 – 2023-09-28 (×3): 50 mg via ORAL
  Filled 2023-09-27 (×4): qty 1

## 2023-09-27 MED ORDER — AMLODIPINE BESYLATE 5 MG PO TABS
10.0000 mg | ORAL_TABLET | Freq: Every day | ORAL | Status: DC
  Administered 2023-09-27 – 2023-09-28 (×2): 10 mg via ORAL
  Filled 2023-09-27 (×2): qty 2

## 2023-09-27 MED ORDER — METOPROLOL SUCCINATE ER 50 MG PO TB24
50.0000 mg | ORAL_TABLET | Freq: Every day | ORAL | Status: DC
  Administered 2023-09-27 – 2023-09-28 (×2): 50 mg via ORAL
  Filled 2023-09-27 (×2): qty 1

## 2023-09-27 MED ORDER — LABETALOL HCL 5 MG/ML IV SOLN: 10.0000 mg | INTRAVENOUS | Status: DC | PRN

## 2023-09-27 MED ORDER — PENTAFLUOROPROP-TETRAFLUOROETH EX AERO: 1.0000 | INHALATION_SPRAY | CUTANEOUS | Status: DC | PRN

## 2023-09-27 NOTE — H&P (Signed)
 History and Physical    Sheri Cooper UJW:[id number] DOB: 1950-05-23 DOA: 09/26/2023  PCP: Leesa Pulling, MD   Patient coming from: Home   Chief Complaint:  Chief Complaint  Patient presents with   Shortness of Breath    HPI:  Sheri Cooper is a 73 y.o. 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 Reports ongoing for past 2 days. Last received HD on Saturday. She reports associated cough. No fever, other URI symptoms, sick contacts. No swelling. No chest pain. Does not have controller inhalers at home for COPD. Otherwise no recent illness.    Review of Systems:  ROS complete and negative except as marked above   No Known Allergies  Prior to Admission medications   Medication Sig Start Date End Date Taking? Authorizing Provider  amLODipine  (NORVASC ) 10 MG tablet Take 10 mg by mouth daily.    [provider]  b complex-vitamin c-folic acid  (NEPHRO-VITE) 0.8 MG TABS tablet Take 1 tablet by mouth at bedtime.    [provider]  ferrous sulfate  325 (65 FE) MG EC tablet Take 325 mg by mouth 3 (three) times a week.    [provider]  hydrALAZINE  (APRESOLINE ) 50 MG tablet Take 1 tablet (50 mg total) by mouth 2 (two) times daily. 09/15/23   Demaris Fillers, MD  losartan  (COZAAR ) 50 MG tablet Take 1 tablet (50 mg total) by mouth daily. 09/16/23   Demaris Fillers, MD  metoprolol  succinate (TOPROL -XL) 50 MG 24 hr tablet Take 50 mg by mouth daily. 10/15/22   [provider]  pantoprazole  (PROTONIX ) 40 MG tablet Take 1 tablet (40 mg total) by mouth 2 (two) times daily. 06/05/22 09/08/23  Justina Oman, MD  sevelamer  carbonate (RENVELA ) 800 MG tablet Take 2,400 mg by mouth 3 (three) times daily with meals.    [provider]    Past Medical History:  Diagnosis Date   Anemia    Chronic kidney disease (CKD), stage IV (severe) (HCC)    COPD (chronic obstructive pulmonary disease) (HCC)     ESRD (end stage renal disease) (HCC)    Hemodialysis T, TH, SAT, [address] , [place]   GERD (gastroesophageal reflux disease)    History of blood transfusion    History of blood transfusion    Hypertension    Pneumonia    3 times in 2024-   PUD (peptic ulcer disease) 05/2022   Rheumatoid arthritis (HCC)    Rheumatoid arthritis (HCC)     Past Surgical History:  Procedure Laterality Date   A/V SHUNTOGRAM N/A 05/02/2023   Procedure: A/V SHUNTOGRAM;  Surgeon: Philipp Brawn, MD;  Location: [facility] INVASIVE CV LAB;  Service: Cardiovascular;  Laterality: N/A;   AV FISTULA PLACEMENT Left 11/29/2022   Procedure: INSERTION OF LEFT UPPER  ARTERIOVENOUS (AV) GORE-TEX GRAFT ARM;  Surgeon: Kayla Part, MD;  Location: [facility] OR;  Service: Vascular;  Laterality: Left;   CENTRAL VENOUS CATHETER INSERTION Left 05/27/2022   Procedure: INSERTION CENTRAL LINE ADULT;  Surgeon: Awilda Bogus, MD;  Location: [facility] ORS;  Service: General;  Laterality: Left;   COLONOSCOPY WITH PROPOFOL  N/A 05/22/2022   Surgeon: Urban Garden, MD; blood in the terminal ileum and colon.   ESOPHAGOGASTRODUODENOSCOPY (EGD) WITH PROPOFOL  N/A 05/22/2022   Surgeon: Umberto Ganong, Bearl Limes, MD; peptic ulcer disease with 2 nonbleeding cratered gastric ulcers, multiple duodenal ulcers, 2 of which had adherent clots that were  removed and revealed 2 vessels.  1 vessel was clipped, cauterized, and treated with epinephrine  injection.  Second vessel was clipped and treated with cautery, clip fell off.   IR FLUORO GUIDE CV LINE RIGHT  05/24/2022   IR US  GUIDE VASC ACCESS RIGHT  05/24/2022   LAPAROTOMY N/A 05/27/2022   Procedure: EXPLORATORY LAPAROTOMY, OMENTAL PATH, DRAIN PLACEMENT;  Surgeon: Awilda Bogus, MD;  Location: [facility] ORS;  Service: General;  Laterality: N/A;   PERIPHERAL VASCULAR BALLOON ANGIOPLASTY  05/02/2023   Procedure: PERIPHERAL VASCULAR BALLOON ANGIOPLASTY;  Surgeon: Philipp Brawn, MD;  Location: [facility] INVASIVE CV  LAB;  Service: Cardiovascular;;   PERIPHERAL VASCULAR THROMBECTOMY Left 08/18/2023   Procedure: PERIPHERAL VASCULAR THROMBECTOMY;  Surgeon: Patrick Boor, MD;  Location: [facility] INVASIVE CV LAB;  Service: Cardiovascular;  Laterality: Left;   VENOUS ANGIOPLASTY Left 08/18/2023   Procedure: VENOUS ANGIOPLASTY;  Surgeon: Patrick Boor, MD;  Location: [facility] INVASIVE CV LAB;  Service: Cardiovascular;  Laterality: Left;  80% Intragraft; 50% Venous Anastomosis   VOLVULUS REDUCTION     colon resection     reports that she has quit smoking. Her smoking use included cigarettes. She has been exposed to tobacco smoke. She has never used smokeless tobacco. She reports that she does not currently use alcohol. She reports that she does not use drugs.  History reviewed. No pertinent family history.   Physical Exam: Vitals:   09/26/23 2225 09/26/23 2305 09/26/23 2330 09/26/23 2345  BP: (!) 197/87 (!) 193/54 (!) 183/66   Pulse: (!) 102 91 94 99  Resp: (!) 22 (!) 35 (!) 22 (!) 22  Temp: 98.6 F (37 C)     TempSrc: Oral     SpO2: 93% 93% 91% 100%  Weight:      Height:        Gen: Awake, alert, chronically ill appearing   CV: Regular, normal S1, S2, 1/6 SEM  Resp: Mild increased WOB, tachypneic, diffuse expiratory wheezing   Abd: Flat, normoactive, nontender MSK: Symmetric, no edema  Skin: No rashes or lesions to exposed skin  Neuro: Alert and interactive  Psych: euthymic, appropriate    Data review:   Labs reviewed, notable for:   K6.2 ESRD WBC 13 down from prior  Micro:  Results for orders placed or performed during the hospital encounter of 09/26/23  Resp panel by RT-PCR (RSV, Flu A&B, Covid) Anterior Nasal Swab     Status: None   Collection Time: 09/26/23 10:45 PM   Specimen: Anterior Nasal Swab  Result Value Ref Range Status   SARS Coronavirus 2 by RT PCR NEGATIVE NEGATIVE Final    Comment: (NOTE) SARS-CoV-2 target nucleic acids are NOT DETECTED.  The SARS-CoV-2 RNA is generally  detectable in upper respiratory specimens during the acute phase of infection. The lowest concentration of SARS-CoV-2 viral copies this assay can detect is 138 copies/mL. A negative result does not preclude SARS-Cov-2 infection and should not be used as the sole basis for treatment or other patient management decisions. A negative result may occur with  improper specimen collection/handling, submission of specimen other than nasopharyngeal swab, presence of viral mutation(s) within the areas targeted by this assay, and inadequate number of viral copies(<138 copies/mL). A negative result must be combined with clinical observations, patient history, and epidemiological information. The expected result is Negative.  Fact Sheet for Patients:  BloggerCourse.com  Fact Sheet for Healthcare Providers:  SeriousBroker.it  This test is no t yet approved or cleared by the [place]   FDA and  has been authorized for detection and/or diagnosis of SARS-CoV-2 by FDA under an Emergency Use Authorization (EUA). This EUA will remain  in effect (meaning this test can be used) for the duration of the COVID-19 declaration under Section 564(b)(1) of the Act, 21 U.S.C.section 360bbb-3(b)(1), unless the authorization is terminated  or revoked sooner.       Influenza A by PCR NEGATIVE NEGATIVE Final   Influenza B by PCR NEGATIVE NEGATIVE Final    Comment: (NOTE) The Xpert Xpress SARS-CoV-2/FLU/RSV plus assay is intended as an aid in the diagnosis of influenza from Nasopharyngeal swab specimens and should not be used as a sole basis for treatment. Nasal washings and aspirates are unacceptable for Xpert Xpress SARS-CoV-2/FLU/RSV testing.  Fact Sheet for Patients: BloggerCourse.com  Fact Sheet for Healthcare Providers: SeriousBroker.it  This test is not yet approved or cleared by the [place]  FDA  and has been authorized for detection and/or diagnosis of SARS-CoV-2 by FDA under an Emergency Use Authorization (EUA). This EUA will remain in effect (meaning this test can be used) for the duration of the COVID-19 declaration under Section 564(b)(1) of the Act, 21 U.S.C. section 360bbb-3(b)(1), unless the authorization is terminated or revoked.     Resp Syncytial Virus by PCR NEGATIVE NEGATIVE Final    Comment: (NOTE) Fact Sheet for Patients: BloggerCourse.com  Fact Sheet for Healthcare Providers: SeriousBroker.it  This test is not yet approved or cleared by the [place]  FDA and has been authorized for detection and/or diagnosis of SARS-CoV-2 by FDA under an Emergency Use Authorization (EUA). This EUA will remain in effect (meaning this test can be used) for the duration of the COVID-19 declaration under Section 564(b)(1) of the Act, 21 U.S.C. section 360bbb-3(b)(1), unless the authorization is terminated or revoked.  Performed at [facility] - , [address], [place], [place] [address]     Imaging reviewed:  [facility] Chest [facility] 1 View Result Date: 09/26/2023 CLINICAL DATA:  Severe shortness of breath, chest pain, cough EXAM: PORTABLE CHEST 1 VIEW COMPARISON:  09/13/2023 FINDINGS: Stable cardiomegaly. Aortic atherosclerotic calcification. Left greater than right pleural effusions and associated airspace opacities which may represent atelectasis or pneumonia. No pneumothorax. Surgical clips left chest wall. IMPRESSION: Left greater than right pleural effusions and associated airspace opacities which may represent atelectasis or pneumonia. Findings are similar to 09/13/2023. Electronically Signed   By: Rozell Cornet M.D.   On: 09/26/2023 22:53   Personally reviewed chest x-ray   EKG:  Personally reviewed, sinus rhythm, QTc 490.  Artifact limiting interpretation possible ST depression inferiorly.  ED Course:  Treated with calcium   gluconate, insulin  D50, Lasix  40 mg IV, amp of bicarb for hyperkalemia.  EDP consulted with Nephrology Dr. Edson Graces with plan for dialysis.    Assessment/Plan:  73 y.o. 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 Found to have volume overload and hyperkalemia related to ESRD, possible component of COPD exacerbation.   Volume overload  Hyperkalemia  Hx ESRD  Acute Sob, arrives with resp distress, RR in mid 20-30s. Without associated hypoxia. Exam with significant wheezing. CXR demonstrating bilateral effusion and patchy GGO. Suspect presentation mainly related to volume overload from her underlying ESRD. However will eval for worsening heart failure, and treat with steroids for underlying COPD as well.  - EDP consulted with Nephrology Dr. Edson Graces with plan for dialysis for volume management  - s/p Ca gluconate, insulin  / D50, lasix , bicarb, lokelma  for HyperKalemia in the Ed. Continue Lokelma daily for now. Repeat K, temporize as needed prior to HD   COPD with acute exacerbation  - Started on methylprednisolone  60 mg IV every 12 hours - Hold off on antibiotics as she has minimal change in cough or sputum production - DuoNebs every 6 hours scheduled, albuterol  every 4 hours as needed, incentive spirometer, flutter valve, encourage out of bed to chair. - Would consider LAMA-LABA at discharge  - Consider referral to pulmonary rehab  Eval for heart failure  Suspect component of underlying HFpEF, last TTE 5/'24 with LVEF 60-65%, mild cLVH, indeterminate diastolic function, elevated RVSP at 30. Globular heart shadow on XR, question pericardial effusion.  - TTE -Volume management above  Hypertensive urgency Severe range hypertension systolics in the 190s - Resume home antihypertensives losartan  50 mg daily, amlodipine  10 mg daily, hydralazine  50 mg twice daily, metoprolol  50 mg daily - Labetalol  10 mg IV as needed for SBP greater than  180  Chronic medical problems: History RA: Not on DMARDs PUD: Continue home PPI twice daily    Body mass index is 18.26 kg/m.  Mild protein calorie malnutrition   DVT prophylaxis:  SQ Heparin  Code Status:  Full Code Diet:  Diet Orders (From admission, onward)     Start     Ordered   09/26/23 2346  Diet renal with fluid restriction Fluid restriction: 1200 mL Fluid; Room service appropriate? Yes; Fluid consistency: Thin  Diet effective now       Question Answer Comment  Fluid restriction: 1200 mL Fluid   Room service appropriate? Yes   Fluid consistency: Thin      09/26/23 2346           Family Communication:  No   Consults:  Nephrology   Admission status:   Obs , Step Down Unit  Severity of Illness: The appropriate patient status for this patient is OBSERVATION. Observation status is judged to be reasonable and necessary in order to provide the required intensity of service to ensure the patient's safety. The patient's presenting symptoms, physical exam findings, and initial radiographic and laboratory data in the context of their medical condition is felt to place them at decreased risk for further clinical deterioration. Furthermore, it is anticipated that the patient will be medically stable for discharge from the hospital within 2 midnights of admission.    Arnulfo Larch, MD [facility]  How to contact the [facility] Attending or Consulting provider 7A - 7P or covering provider during after hours 7P -7A, for this patient.  Check the care team in [facility] and look for a) attending/consulting [facility] provider listed and b) the [facility] team listed Log into [email or web address] and use [place]'s universal password to access. If you do not have the password, please contact the hospital operator. Locate the [facility] provider you are looking for under [facility] and page to a number that you can be directly reached. If you still have difficulty reaching the provider, please page the  [facility] (Director on Call) for the Hospitalists listed on amion for assistance.  09/27/2023, 12:17 AM

## 2023-09-27 NOTE — Progress Notes (Signed)
   HEMODIALYSIS TREATMENT NOTE:  3.25 h heparin -free treatment completed using left upper arm AVG.  3 liters removed.  UF was limited by cramping.  All blood was returned.  No meds given.  Post-HD:  09/27/23 2025  Vitals  BP (!) 141/51  MAP (mmHg) 76  BP Location Right Arm  BP Method Automatic  Patient Position (if appropriate) Sitting  Pulse Rate 72  Pulse Rate Source Monitor  ECG Heart Rate 74  Resp 20  Oxygen Therapy  SpO2 98 %  O2 Device Room Air  Post Treatment  Dialyzer Clearance Heavily streaked  Hemodialysis Intake (mL) 0 mL  Liters Processed 63.6  Fluid Removed (mL) 3000 mL  Tolerated HD Treatment Yes  AVG/AVF Arterial Site Held (minutes) 7 minutes  AVG/AVF Venous Site Held (minutes) 7 minutes  Fistula / Graft Left Upper arm Arteriovenous vein graft  Placement Date/Time: 11/29/22 1428   Orientation: Left  Access Location: Upper arm  Access Type: Arteriovenous vein graft  Fistula / Graft Assessment Thrill;Bruit  Status Patent    Waunita Haff, RN AP K[facility]

## 2023-09-27 NOTE — Progress Notes (Signed)
 Transition of Care Department [facility]) has reviewed patient and no other TOC needs have been identified at this time. We will continue to monitor patient advancement through interdisciplinary progression rounds. If new patient transition needs arise, please place a TOC consult.   09/27/23 0808  TOC Brief Assessment  Insurance and Status Reviewed  Patient has primary care physician Yes  Home environment has been reviewed Lives alone.  Prior level of function: Independent.  Prior/Current Home Services No current home services  Social Drivers of Health Review SDOH reviewed no interventions necessary  Readmission risk has been reviewed Yes  Transition of care needs no transition of care needs at this time

## 2023-09-27 NOTE — Progress Notes (Signed)
*  PRELIMINARY RESULTS* Echocardiogram 2D Echocardiogram has been performed.  Sheri Cooper 09/27/2023, 10:42 AM

## 2023-09-27 NOTE — Plan of Care (Signed)
  Problem: Education: Goal: Knowledge of General Education information will improve Description: Including pain rating scale, medication(s)/side effects and non-pharmacologic comfort measures Outcome: Progressing   Problem: Health Behavior/Discharge Planning: Goal: Ability to manage health-related needs will improve Outcome: Progressing   Problem: Clinical Measurements: Goal: Ability to maintain clinical measurements within normal limits will improve Outcome: Progressing Goal: Will remain free from infection Outcome: Progressing Goal: Diagnostic test results will improve Outcome: Progressing Goal: Respiratory complications will improve Outcome: Progressing   Problem: Activity: Goal: Risk for activity intolerance will decrease Outcome: Progressing   Problem: Nutrition: Goal: Adequate nutrition will be maintained Outcome: Progressing   Problem: Coping: Goal: Level of anxiety will decrease Outcome: Progressing   

## 2023-09-27 NOTE — Plan of Care (Signed)

## 2023-09-27 NOTE — Progress Notes (Signed)
 Patient given Incentive Spirometer and instructions explained to patient.  Patient was able to do .  Patient started having a coughing spell after doing it X10.  Patient was coughing up clear, thick secretions.

## 2023-09-27 NOTE — Progress Notes (Signed)
 Admitted after midnight secondary to shortness of breath, expiratory wheezing and orthopnea.  Patient was also noticed to be hyperkalemic.  Underlying history of end-stage renal disease on dialysis (TTS).  No fever, no sick contacts, no productive coughing spells.  Workup demonstrating fluid overload and vascular congestion with most likely component of COPD exacerbation.  Please refer to H&P written by Dr. Segars on 09/27/2023 for further info/details on admission.  Physical exam: General exam: Alert, awake, oriented x 3; mild difficulty speaking in full sentences and feeling short winded.  No requiring oxygen supplementation and expressing feeling better. Respiratory system: Fine crackles at the bases; positive expiratory wheezing bilaterally. Cardiovascular system:RRR. No rubs or gallops. Gastrointestinal system: Abdomen is nondistended, soft and nontender.  Positive bowel sounds appreciated. Central nervous system: No focal neurological deficits. Extremities: No cyanosis or clubbing. Skin: No petechiae. Psychiatry: Judgement and insight appear normal. Mood & affect appropriate.   Plan: - Continue steroids, bronchodilator management and mucolytic's - Fluid/volume stabilization per nephrology with hemodialysis management. - Patient will benefit of adjustment to her dry weight. - Follow 2D echo -No chest pain, palpitations or any other anginal equivalent symptoms at the moment. -Continue supportive care and follow clinical response.  Justina Oman MD [phone]

## 2023-09-28 DIAGNOSIS — E8779 Other fluid overload: Secondary | ICD-10-CM | POA: Diagnosis not present

## 2023-09-28 LAB — HEPATITIS B SURFACE ANTIBODY, QUANTITATIVE: Hep B S AB Quant (Post): <3.5 m[IU]/mL — ABNORMAL LOW

## 2023-09-28 MED ORDER — PREDNISONE 10 MG PO TABS: 40.0000 mg | ORAL_TABLET | Freq: Every day | ORAL | 0 refills | Status: AC

## 2023-09-28 MED ORDER — ALBUTEROL SULFATE HFA 108 (90 BASE) MCG/ACT IN AERS: 2.0000 | INHALATION_SPRAY | Freq: Four times a day (QID) | RESPIRATORY_TRACT | 2 refills | Status: DC | PRN

## 2023-09-28 NOTE — TOC Initial Note (Signed)
 Transition of Care [facility]) - Initial/Assessment Note    Patient Details  Name: Sheri Cooper MRN: [id number] Date of Birth: April 08, 1951  Transition of Care [facility]) CM/SW Contact:    Ander Katos, LCSW Phone Number: 09/28/2023, 8:18 AM  Clinical Narrative: Pt admitted for volume overload and COPD with acute exacerbation. Assessment completed due to high risk readmission score. Pt reports she lives alone and is independent with ADLs. Pt attends outpatient HD at [facility]. She rides [employer]. Pt anticipating that she will return home with no needs at dc. TOC will continue to follow.                 Expected Discharge Plan: Home/Self Care Barriers to Discharge: Continued Medical Work up   Patient Goals and CMS Choice Patient states their goals for this hospitalization and ongoing recovery are:: return home   Choice offered to / list presented to : Patient [place] ownership interest in [facility]provided to::  (n/a)    Expected Discharge Plan and Services In-house Referral: Clinical Social Work     Living arrangements for the past 2 months: Apartment                                      Prior Living Arrangements/Services Living arrangements for the past 2 months: Apartment Lives with:: Self Patient language and need for interpreter reviewed:: Yes Do you feel safe going back to the place where you live?: Yes      Need for Family Participation in Patient Care: No (Comment)   Current home services: DME Criminal Activity/Legal Involvement Pertinent to Current Situation/Hospitalization: No - Comment as needed  Activities of Daily Living   ADL Screening (condition at time of admission) Independently performs ADLs?: No Does the patient have a NEW difficulty with bathing/dressing/toileting/self-feeding that is expected to last >3 days?: No Does the patient have a NEW difficulty with getting in/out of bed, walking, or climbing stairs that is  expected to last >3 days?: No Does the patient have a NEW difficulty with communication that is expected to last >3 days?: No Is the patient deaf or have difficulty hearing?: No Does the patient have difficulty seeing, even when wearing glasses/contacts?: No Does the patient have difficulty concentrating, remembering, or making decisions?: No  Permission Sought/Granted                  Emotional Assessment     Affect (typically observed): Appropriate Orientation: : Oriented to Self, Oriented to Place, Oriented to  Time, Oriented to Situation Alcohol / Substance Use: Not Applicable Psych Involvement: No (comment)  Admission diagnosis:  Hyperkalemia [E87.5] Pleural effusion [J90] Hypoxia [R09.02] Volume overload [E87.70] Hypertensive urgency [I16.0] Patient Active Problem List   Diagnosis Date Noted   Hyperkalemia 09/27/2023   Volume overload 09/26/2023   Acute on chronic heart failure with preserved ejection fraction (HFpEF) (HCC) 09/14/2023   Pneumonia 09/08/2023   Fluid overload 01/25/2023   COPD with acute exacerbation (HCC) 01/25/2023   Primary localized osteoarthrosis of multiple sites 09/14/2022   Weight loss 09/14/2022   Acute respiratory failure with hypoxia (HCC) 09/07/2022   Lobar pneumonia (HCC) 09/07/2022   ESRD (end stage renal disease) (HCC) 09/07/2022   Sepsis due to undetermined organism (HCC) 05/30/2022   Pneumoperitoneum 05/28/2022   Duodenal perforation (HCC) 05/28/2022   Peritonitis (HCC) 05/28/2022   Acute upper GI bleed 05/24/2022   Multiple  duodenal ulcers 05/24/2022   ABLA (acute blood loss anemia) 05/24/2022   Melena 05/22/2022   HCAP (healthcare-associated pneumonia) 05/16/2022   Hypothermia 05/16/2022   Acute renal failure superimposed on stage 3b chronic kidney disease (HCC) 05/16/2022   Elevated brain natriuretic peptide (BNP) level 05/16/2022   Elevated troponin 05/16/2022   Increased anion gap metabolic acidosis 05/16/2022    Prolonged QT interval 05/16/2022   Hypoalbuminemia due to protein-calorie malnutrition (HCC) 05/16/2022   Iron  deficiency anemia 05/16/2022   CKD (chronic kidney disease), stage V (HCC) 05/16/2022   Renal mass 05/14/2022   Simple renal cyst 05/14/2022   Vitamin D deficiency 05/14/2022   Chronic obstructive pulmonary disease with (acute) exacerbation (HCC) 05/07/2022   Pneumonia due to infectious organism 05/07/2022   Acute nontraumatic kidney injury (HCC) 05/07/2022   Mixed hyperlipidemia 05/07/2022   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   Primary hypertension 05/07/2022   Rheumatoid arthritis involving both hands with negative rheumatoid factor (HCC) 05/07/2022   Medication therapy management recommendation declined by patient 07/28/2020   Encounter for monitoring aromatase inhibitor therapy 06/12/2020   History of radiation therapy 06/12/2020   Involvement of right lateral surgical margin by neoplasm 04/16/2020   Status post breast lumpectomy 04/16/2020   Coordination of complex care 01/23/2020   Osteopenia of multiple sites 01/23/2020   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 - [place], [place] - [address] [place] [address] Phone: [phone] Fax: [phone]     Social Drivers of Health (SDOH) Social History: SDOH Screenings   Food Insecurity: No Food Insecurity (09/27/2023)  Housing: Low Risk  (09/27/2023)  Transportation Needs: No Transportation Needs (09/27/2023)  Utilities: Not At Risk (09/27/2023)  Alcohol Screen: Low Risk  (03/07/2023)  Depression (PHQ2-9): Low Risk  (03/07/2023)   Financial Resource Strain: Low Risk  (03/07/2023)  Physical Activity: Inactive (03/07/2023)  Social Connections: Unknown (09/27/2023)  Stress: No Stress Concern Present (03/07/2023)  Tobacco Use: Medium Risk (09/26/2023)  Health Literacy: Adequate Health Literacy (03/07/2023)   SDOH Interventions:     Readmission Risk Interventions    09/28/2023    8:15 AM 09/09/2023   10:03 AM 01/27/2023   10:19 AM  Readmission Risk Prevention Plan  Transportation Screening Complete Complete Complete  HRI or Home Care Consult Complete Complete   Social Work Consult for Recovery Care Planning/Counseling Complete Complete   Palliative Care Screening Not Applicable Not Applicable   Medication Review Oceanographer) Complete Complete Complete  HRI or Home Care Consult   Complete  SW Recovery Care/Counseling Consult   Complete  Palliative Care Screening   Not Applicable  Skilled Nursing Facility   Not Applicable

## 2023-09-28 NOTE — Care Management Important Message (Signed)
 Important Message  Patient Details  Name: GAETANA KAWAHARA MRN: [id number] Date of Birth: 18-Jun-1950   Important Message Given:  N/A - LOS <3 / Initial given by admissions     Neila Bally 09/28/2023, 11:32 AM

## 2023-09-28 NOTE — Consult Note (Addendum)
 ESRD Consult Note  Requesting provider: Justina Oman Service requesting consult: Hospitalist Reason for consult: ESRD, provision of dialysis Indication for acute dialysis?: End Stage Renal Disease  Outpatient dialysis unit: [facility]  Assessment/Recommendations: Sheri Cooper is a/an 73 y.o. female with a past medical history notable for ESRD on HD admitted with shortness of breath.   # ESRD: Some volume excess plan on HD today per schedule with UF as tolerated. Then maintain TTS schedule  # Volume/ hypertension: Some volume overload with high blood pressures.  Continue home blood pressure medications.  Ultrafiltration as tolerated  # Anemia of Chronic Kidney Disease: Hemoglobin 8.6.  On ESA outpatient.  Unclear when she last received.  If she remains in the hospital consider dosing here  # Secondary Hyperparathyroidism/Hyperphosphatemia: Calcium  slightly low at 8.4 and with historically low albumin  likely improved to normal.  Phosphorus at goal at 5.3.  # Vascular access: Left upper extremity AVG with no issues  # Shortness of breath: Likely volume overload and some COPD contributing.  Management per primary team and UF as above   # Additional recommendations: - Dose all meds for creatinine clearance < 10 ml/min  - Unless absolutely necessary, no MRIs with gadolinium.  - Implement save arm precautions.  Prefer needle sticks in the dorsum of the hands or wrists.  No blood pressure measurements in arm. - If blood transfusion is requested during hemodialysis sessions, please alert us  prior to the session.  - Use synthetic opioids (Fentanyl /Dilaudid ) if needed  Recommendations were discussed with the primary team.   History of Present Illness: Sheri Cooper is a/an 73 y.o. female with a past medical history of ESRD who presents with shortness of breath  Patient presented to the hospital with worsening shortness of breath on 5/26.  She said that she had been having  shortness of breath for a couple days prior to presentation and last had hemodialysis on Saturday.  She had had a cough that was dry but no fevers, runny nose, congestion.  Also has not noted any edema.  Denies chest pain, nausea, vomiting, diarrhea.  She said this has been a recurrent issue with volume accumulation on dialysis.  Every time she comes to the hospital she feels like fluid is being removed very easily but in the outpatient setting sometimes fluid is not removed as much and she is unclear why.  She has plans to meet with her dialysis team for further evaluation of this issue and discuss other options.   Medications:  Current Facility-Administered Medications  Medication Dose Route Frequency Provider Last Rate Last Admin   acetaminophen  (TYLENOL ) tablet 1,000 mg  1,000 mg Oral Q6H PRN Arnulfo Larch, MD   1,000 mg at 09/27/23 0040   albuterol  (PROVENTIL ) (2.5 MG/3ML) 0.083% nebulizer solution 2.5 mg  2.5 mg Nebulization Q4H PRN Segars, Arlyce Lambert, MD       amLODipine  (NORVASC ) tablet 10 mg  10 mg Oral Daily Segars, Jonathan, MD   10 mg at 09/28/23 1610   Chlorhexidine  Gluconate Cloth 2 % PADS 6 each  6 each Topical Q0600 Clementine Cutting, MD   6 each at 09/28/23 9604   heparin  injection 5,000 Units  5,000 Units Subcutaneous Q8H Segars, Jonathan, MD   5,000 Units at 09/28/23 0547   hydrALAZINE  (APRESOLINE ) tablet 50 mg  50 mg Oral BID Segars, Jonathan, MD   50 mg at 09/28/23 5409   labetalol  (NORMODYNE ) injection 10 mg  10 mg Intravenous Q2H PRN Arnulfo Larch, MD  lidocaine  (PF) (XYLOCAINE ) 1 % injection 5 mL  5 mL Intradermal PRN Clementine Cutting, MD       lidocaine -prilocaine  (EMLA ) cream 1 Application  1 Application Topical PRN Bhandari, Dron Prasad, MD       losartan  (COZAAR ) tablet 50 mg  50 mg Oral Daily Segars, Jonathan, MD   50 mg at 09/28/23 1610   melatonin tablet 6 mg  6 mg Oral QHS PRN Segars, Jonathan, MD   6 mg at 09/27/23 2144   methylPREDNISolone  sodium  succinate (SOLU-MEDROL ) 125 mg/2 mL injection 60 mg  60 mg Intravenous Q12H Segars, Jonathan, MD   60 mg at 09/28/23 0019   metoprolol  succinate (TOPROL -XL) 24 hr tablet 50 mg  50 mg Oral Daily Segars, Jonathan, MD   50 mg at 09/28/23 9604   pantoprazole  (PROTONIX ) EC tablet 40 mg  40 mg Oral BID Segars, Jonathan, MD   40 mg at 09/28/23 5409   pentafluoroprop-tetrafluoroeth (GEBAUERS) aerosol 1 Application  1 Application Topical PRN Bhandari, Dron Prasad, MD       polyethylene glycol (MIRALAX / GLYCOLAX) packet 17 g  17 g Oral Daily PRN Arnulfo Larch, MD       sodium chloride  flush (NS) 0.9 % injection 3 mL  3 mL Intravenous Q12H Segars, Jonathan, MD   3 mL at 09/28/23 8119     ALLERGIES Patient has no known allergies.  MEDICAL HISTORY Past Medical History:  Diagnosis Date   Anemia    Chronic kidney disease (CKD), stage IV (severe) (HCC)    COPD (chronic obstructive pulmonary disease) (HCC)    ESRD (end stage renal disease) (HCC)    Hemodialysis T, TH, [facility], [address] [facility] [place]   GERD (gastroesophageal reflux disease)    History of blood transfusion    History of blood transfusion    Hypertension    Pneumonia    3 times in 2024-   PUD (peptic ulcer disease) 05/2022   Rheumatoid arthritis (HCC)    Rheumatoid arthritis (HCC)      SOCIAL HISTORY Social History   Socioeconomic History   Marital status: Significant Other    Spouse name: Not on file   Number of children: 0   Years of education: 12   Highest education level: 12th grade  Occupational History   Not on file  Tobacco Use   Smoking status: Former    Types: Cigarettes    Passive exposure: Past   Smokeless tobacco: Never  Vaping Use   Vaping status: Never Used  Substance and Sexual Activity   Alcohol use: Not Currently   Drug use: Never   Sexual activity: Not Currently  Other Topics Concern   Not on file  Social History Narrative   Not on file   Social Drivers of Health   Financial Resource Strain: Low  Risk  (03/07/2023)   Overall Financial Resource Strain (CARDIA)    Difficulty of Paying Living Expenses: Not hard at all  Food Insecurity: No Food Insecurity (09/27/2023)   Hunger Vital Sign    Worried About Running Out of Food in the Last Year: Never true    Ran Out of Food in the Last Year: Never true  Transportation Needs: No Transportation Needs (09/27/2023)   PRAPARE - Administrator, Civil Service (Medical): No    Lack of Transportation (Non-Medical): No  Physical Activity: Inactive (03/07/2023)   Exercise Vital Sign    Days of Exercise per Week: 0 days    Minutes of  Exercise per Session: 0 min  Stress: No Stress Concern Present (03/07/2023)   [employer] of Occupational Health - Occupational Stress Questionnaire    Feeling of Stress : Only a little  Social Connections: Unknown (09/27/2023)   Social Connection and Isolation Panel [NHANES]    Frequency of Communication with Friends and Family: More than three times a week    Frequency of Social Gatherings with Friends and Family: Three times a week    Attends Religious Services: Never    Active Member of Clubs or Organizations: No    Attends Banker Meetings: Never    Marital Status: Patient declined  Intimate Partner Violence: Not At Risk (09/27/2023)   Humiliation, Afraid, Rape, and Kick questionnaire    Fear of Current or Ex-Partner: No    Emotionally Abused: No    Physically Abused: No    Sexually Abused: No     FAMILY HISTORY History reviewed. No pertinent family history.   Review of Systems: 12 systems were reviewed and negative except per HPI  Physical Exam: Vitals:   09/28/23 0751 09/28/23 0834  BP:    Pulse:    Resp:    Temp: (!) 97.3 F (36.3 C)   SpO2:  95%   Total I/O In: 240 [P.O.:240] Out: -   Intake/Output Summary (Last 24 hours) at 09/28/2023 0908 Last data filed at 09/28/2023 0751 Gross per 24 hour  Intake 240 ml  Output 3000 ml  Net -2760 ml   General:  well-appearing, no acute distress HEENT: anicteric sclera, MMM CV: normal rate, no murmurs, no edema Lungs: bilateral chest rise, normal wob Abd: soft, non-tender, non-distended Skin: no visible lesions or rashes Psych: alert, engaged, appropriate mood and affect Neuro: normal speech, no gross focal deficits   Test Results Reviewed Lab Results  Component Value Date   NA 139 09/27/2023   K 4.7 09/27/2023   CL 106 09/27/2023   CO2 23 09/27/2023   BUN 38 (H) 09/27/2023   CREATININE 6.29 (H) 09/27/2023   CALCIUM  8.4 (L) 09/27/2023   ALBUMIN  3.0 (L) 09/14/2023   PHOS 5.3 (H) 09/26/2023    I have reviewed relevant outside healthcare records

## 2023-09-28 NOTE — Discharge Summary (Signed)
 Physician Discharge Summary  Lamis Behrmann Kosinski OZH:[id number] DOB: 1951-01-19 DOA: 09/26/2023  PCP: Leesa Pulling, MD  Admit date: 09/26/2023  Discharge date: 09/28/2023  Admitted From:Home  Disposition:  Home  Recommendations for Outpatient Follow-up:  Follow up with PCP in 1-2 weeks Continue on prednisone  as prescribed for 3 more days and use albuterol  inhaler as needed for shortness of breath or wheezing Continue other home medications as prior Continue hemodialysis as previously scheduled.  Home Health: None  Equipment/Devices: None  Discharge Condition:Stable  CODE STATUS: Full  Diet recommendation: Heart Healthy  Brief/Interim Summary: Jennelle Pinkstaff Reino is a 73 y.o. 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 Reports ongoing for past 2 days. Last received HD on Saturday. She reports associated cough. No fever, other URI symptoms, sick contacts. No swelling. No chest pain. Does not have controller inhalers at home for COPD.  Patient was admitted for dyspnea secondary to volume overload in the setting of ESRD and had some elevated blood pressures which have now improved.  She was also found to have COPD exacerbation which has resolved and she may transition to oral steroids with prednisone  as ordered as well as home inhaler as needed for any shortness of breath or wheezing.  No other acute events or concerns noted.  Discharge Diagnoses:  Principal Problem:   Volume overload Active Problems:   Chronic obstructive pulmonary disease with (acute) exacerbation (HCC)   ESRD (end stage renal disease) (HCC)   Hyperkalemia  Principal discharge diagnosis: Dyspnea-multifactorial in the setting of volume overload as well as acute COPD exacerbation.  Discharge Instructions  Discharge Instructions     Diet - low sodium heart healthy   Complete by: As directed    Increase activity slowly   Complete by: As directed        Allergies as of 09/28/2023   No Known Allergies      Medication List     TAKE these medications    albuterol  108 (90 Base) MCG/ACT inhaler Commonly known as: VENTOLIN  HFA Inhale 2 puffs into the lungs every 6 (six) hours as needed for wheezing or shortness of breath.   amLODipine  10 MG tablet Commonly known as: NORVASC  Take 10 mg by mouth daily.   b complex-vitamin c-folic acid  0.8 MG Tabs tablet Take 1 tablet by mouth at bedtime.   ferrous sulfate  325 (65 FE) MG EC tablet Take 325 mg by mouth 3 (three) times a week.   hydrALAZINE  50 MG tablet Commonly known as: APRESOLINE  Take 1 tablet (50 mg total) by mouth 2 (two) times daily.   losartan  50 MG tablet Commonly known as: COZAAR  Take 1 tablet (50 mg total) by mouth daily.   metoprolol  succinate 50 MG 24 hr tablet Commonly known as: TOPROL -XL Take 50 mg by mouth daily.   pantoprazole  40 MG tablet Commonly known as: Protonix  Take 1 tablet (40 mg total) by mouth 2 (two) times daily.   predniSONE  10 MG tablet Commonly known as: DELTASONE  Take 4 tablets (40 mg total) by mouth daily for 3 days.   sevelamer  carbonate 800 MG tablet Commonly known as: RENVELA  Take 2,400 mg by mouth 3 (three) times daily with meals.        Follow-up Information     Leesa Pulling, MD. Schedule an appointment as soon as possible for a visit in 1 week(s).   Specialty: Family Medicine Contact information: [address] [facility]  [place] [address] [phone]                No Known Allergies  Consultations: Nephrology   Procedures/Studies: ECHOCARDIOGRAM COMPLETE Result Date: 09/27/2023    ECHOCARDIOGRAM REPORT   Patient Name:   LAYLANIE KRUCZEK Date of Exam: 09/27/2023 Medical Rec #:  [id number]           Height:       59.0 in Accession #:    [id number]          Weight:       90.4 lb Date of Birth:  [birth year]           BSA:          1.316 m Patient Age:    72 years            BP:           184/45 mmHg Patient Gender: F                    HR:           91 bpm. Exam Location:  [facility] Procedure: 2D Echo, Cardiac Doppler and Color Doppler (Both Spectral and Color            Flow Doppler were utilized during procedure). Indications:    Congestive Heart Failure I50.9  History:        Patient has prior history of Echocardiogram examinations, most                 recent 09/08/2022. Risk Factors:Hypertension and Dyslipidemia. Hx                 of ESRD.  Sonographer:    Denese Finn RCS Referring Phys: [id number] JONATHAN SEGARS IMPRESSIONS  1. Left ventricular ejection fraction, by estimation, is 60 to 65%. The left ventricle has normal function. The left ventricle has no regional wall motion abnormalities. Left ventricular diastolic parameters are indeterminate.  2. Right ventricular systolic function is normal. The right ventricular size is normal. There is mildly elevated pulmonary artery systolic pressure. The estimated right ventricular systolic pressure is 41.2 mmHg.  3. Left atrial size was mildly dilated.  4. Left pleural effusion noted.  5. The mitral valve is grossly normal. Mild mitral valve regurgitation.  6. The aortic valve is tricuspid. There is mild calcification of the aortic valve. Aortic valve regurgitation is not visualized. Aortic valve sclerosis/calcification is present, without any evidence of aortic stenosis. Aortic valve mean gradient measures 8.0 mmHg.  7. The inferior vena cava is normal in size with greater than 50% respiratory variability, suggesting right atrial pressure of 3 mmHg. Comparison(s): Prior images reviewed side by side. LVEF normal range at 60-65%. Indeterminate diastolic function. Mildly elevated RVSP. Left pleural effusion noted. FINDINGS  Left Ventricle: Left ventricular ejection fraction, by estimation, is 60 to 65%. The left ventricle has normal function. The left ventricle has no regional wall motion abnormalities. The left ventricular internal cavity size was normal in size. There is  no  left ventricular hypertrophy. Left ventricular diastolic parameters are indeterminate. Right Ventricle: The right ventricular size is normal. No increase in right ventricular wall thickness. Right ventricular systolic function is normal. There is mildly elevated pulmonary artery systolic pressure. The tricuspid regurgitant velocity is 3.09  m/s, and with an assumed right atrial pressure of 3 mmHg, the estimated right ventricular systolic pressure is 41.2 mmHg. Left Atrium: Left atrial size was mildly dilated. Right Atrium: Right  atrial size was normal in size. Pericardium: Left pleural effusion noted. Trivial pericardial effusion is present. The pericardial effusion is anterior to the right ventricle. Presence of epicardial fat layer. Mitral Valve: The mitral valve is grossly normal. Mild mitral annular calcification. Mild mitral valve regurgitation. Tricuspid Valve: The tricuspid valve is grossly normal. Tricuspid valve regurgitation is mild. Aortic Valve: The aortic valve is tricuspid. There is mild calcification of the aortic valve. There is mild to moderate aortic valve annular calcification. Aortic valve regurgitation is not visualized. Aortic valve sclerosis/calcification is present, without any evidence of aortic stenosis. Aortic valve mean gradient measures 8.0 mmHg. Aortic valve peak gradient measures 16.2 mmHg. Aortic valve area, by VTI measures 1.17 cm. Pulmonic Valve: The pulmonic valve was grossly normal. Pulmonic valve regurgitation is trivial. Aorta: The aortic root is normal in size and structure. Venous: The inferior vena cava is normal in size with greater than 50% respiratory variability, suggesting right atrial pressure of 3 mmHg. IAS/Shunts: No atrial level shunt detected by color flow Doppler. Additional Comments: 3D was performed not requiring image post processing on an independent workstation and was indeterminate.  LEFT VENTRICLE PLAX 2D LVIDd:         4.50 cm   Diastology LVIDs:          3.00 cm   LV e' medial:    8.92 cm/s LV PW:         0.80 cm   LV E/e' medial:  14.0 LV IVS:        1.00 cm   LV e' lateral:   10.80 cm/s LVOT diam:     1.60 cm   LV E/e' lateral: 11.6 LV SV:         50 LV SV Index:   38 LVOT Area:     2.01 cm  RIGHT VENTRICLE RV S prime:     14.80 cm/s TAPSE (M-mode): 2.5 cm LEFT ATRIUM             Index        RIGHT ATRIUM           Index LA diam:        3.10 cm 2.36 cm/m   RA Area:     16.00 cm LA Vol (A2C):   52.3 ml 39.74 ml/m  RA Volume:   44.10 ml  33.51 ml/m LA Vol (A4C):   40.6 ml 30.85 ml/m LA Biplane Vol: 49.1 ml 37.31 ml/m  AORTIC VALVE AV Area (Vmax):    1.08 cm AV Area (Vmean):   1.18 cm AV Area (VTI):     1.17 cm AV Vmax:           201.00 cm/s AV Vmean:          130.000 cm/s AV VTI:            0.429 m AV Peak Grad:      16.2 mmHg AV Mean Grad:      8.0 mmHg LVOT Vmax:         108.00 cm/s LVOT Vmean:        76.100 cm/s LVOT VTI:          0.250 m LVOT/AV VTI ratio: 0.58  AORTA Ao Root diam: 2.80 cm MITRAL VALVE                TRICUSPID VALVE MV Area (PHT): 3.31 cm     TR Peak grad:   38.2 mmHg MV Decel Time: 229 msec  TR Vmax:        309.00 cm/s MV E velocity: 125.00 cm/s MV A velocity: 126.00 cm/s  SHUNTS MV E/A ratio:  0.99         Systemic VTI:  0.25 m                             Systemic Diam: 1.60 cm Teddie Favre MD Electronically signed by Teddie Favre MD Signature Date/Time: 09/27/2023/11:53:42 AM    Final    DG Chest Port 1 View Result Date: 09/26/2023 CLINICAL DATA:  Severe shortness of breath, chest pain, cough EXAM: PORTABLE CHEST 1 VIEW COMPARISON:  09/13/2023 FINDINGS: Stable cardiomegaly. Aortic atherosclerotic calcification. Left greater than right pleural effusions and associated airspace opacities which may represent atelectasis or pneumonia. No pneumothorax. Surgical clips left chest wall. IMPRESSION: Left greater than right pleural effusions and associated airspace opacities which may represent atelectasis or pneumonia. Findings  are similar to 09/13/2023. Electronically Signed   By: Rozell Cornet M.D.   On: 09/26/2023 22:53   DG CHEST PORT 1 VIEW Result Date: 09/13/2023 CLINICAL DATA:  Shortness of breath EXAM: PORTABLE CHEST 1 VIEW COMPARISON:  Sep 12, 2023 FINDINGS: Persistent bilateral pulmonary congestive changes with a small bilateral pleural effusions and basilar atelectasis. IMPRESSION: Persistent bilateral pulmonary congestive changes with a small bilateral pleural effusions and basilar atelectasis. Electronically Signed   By: Fredrich Jefferson M.D.   On: 09/13/2023 07:27   DG CHEST PORT 1 VIEW Result Date: 09/12/2023 CLINICAL DATA:  Dyspnea.  Pneumonia.  Acute hypoxia. EXAM: PORTABLE CHEST 1 VIEW COMPARISON:  One-view chest x-ray 09/08/2023. FINDINGS: Heart is enlarged. Atherosclerotic calcifications are present at the aortic arch. Bilateral pleural effusions and basilar airspace disease increased. Surgical clips over the left breast are again. IMPRESSION: 1. Increased bilateral pleural effusions and basilar airspace disease. This likely reflects edema. 2. Cardiomegaly without failure. Electronically Signed   By: Audree Leas M.D.   On: 09/12/2023 12:30   MR BRAIN WO CONTRAST Result Date: 09/10/2023 CLINICAL DATA:  Initial evaluation for acute neuro deficit, stroke suspected. EXAM: MRI HEAD WITHOUT CONTRAST TECHNIQUE: Multiplanar, multiecho pulse sequences of the brain and surrounding structures were obtained without intravenous contrast. COMPARISON:  None Available. FINDINGS: Brain: Cerebral volume within normal limits. Patchy T2/FLAIR hyperintensity involving the periventricular and deep white matter both cerebral hemispheres as well as the pons, most characteristic of chronic microvascular ischemic disease, mild for age. No evidence for acute or subacute infarct. No areas of chronic cortical infarction. No acute or chronic intracranial blood products. No mass lesion, midline shift or mass effect. No hydrocephalus  or extra-axial fluid collection. Pituitary gland within normal limits. Vascular: Major intracranial vascular flow voids are maintained. Skull and upper cervical spine: Craniocervical junction within normal limits. Bone marrow signal intensity normal. No scalp soft tissue abnormality. Sinuses/Orbits: Globes and orbital soft tissues within normal limits. Paranasal sinuses are clear. No mastoid effusion. Other: None. IMPRESSION: 1. No acute intracranial abnormality. 2. Mild chronic microvascular ischemic disease for age. Electronically Signed   By: Virgia Griffins M.D.   On: 09/10/2023 20:00   DG Chest Port 1 View Result Date: 09/08/2023 CLINICAL DATA:  Shortness of breath. EXAM: PORTABLE CHEST 1 VIEW COMPARISON:  Chest [place]  dated 09/05/2023. FINDINGS: Small bilateral pleural effusions and bibasilar atelectasis or infiltrate, slightly progressed since the prior radiograph. There is cardiomegaly with vascular congestion. No pneumothorax. No acute osseous pathology. IMPRESSION: 1. Small bilateral pleural effusions  and bibasilar atelectasis or infiltrate. 2. Cardiomegaly with vascular congestion. Electronically Signed   By: Angus Bark M.D.   On: 09/08/2023 12:33     Discharge Exam: Vitals:   09/28/23 0751 09/28/23 0834  BP:    Pulse:    Resp:    Temp: (!) 97.3 F (36.3 C)   SpO2:  95%   Vitals:   09/28/23 0600 09/28/23 0700 09/28/23 0751 09/28/23 0834  BP: (!) 144/32 (!) 133/30    Pulse: 66 68    Resp: 16 15    Temp:   (!) 97.3 F (36.3 C)   TempSrc:   Oral   SpO2: 94% 94%  95%  Weight:      Height:        General: Pt is alert, awake, not in acute distress Cardiovascular: RRR, S1/S2 +, no rubs, no gallops Respiratory: CTA bilaterally, no wheezing, no rhonchi Abdominal: Soft, NT, ND, bowel sounds + Extremities: no edema, no cyanosis    The results of significant diagnostics from this hospitalization (including imaging, microbiology, ancillary and laboratory) are listed  below for reference.     Microbiology: Recent Results (from the past 240 hours)  Resp panel by RT-PCR (RSV, Flu A&B, Covid) Anterior Nasal Swab     Status: None   Collection Time: 09/26/23 10:45 PM   Specimen: Anterior Nasal Swab  Result Value Ref Range Status   SARS Coronavirus 2 by RT PCR NEGATIVE NEGATIVE Final    Comment: (NOTE) SARS-CoV-2 target nucleic acids are NOT DETECTED.  The SARS-CoV-2 RNA is generally detectable in upper respiratory specimens during the acute phase of infection. The lowest concentration of SARS-CoV-2 viral copies this assay can detect is 138 copies/mL. A negative result does not preclude SARS-Cov-2 infection and should not be used as the sole basis for treatment or other patient management decisions. A negative result may occur with  improper specimen collection/handling, submission of specimen other than nasopharyngeal swab, presence of viral mutation(s) within the areas targeted by this assay, and inadequate number of viral copies(<138 copies/mL). A negative result must be combined with clinical observations, patient history, and epidemiological information. The expected result is Negative.  Fact Sheet for Patients:  BloggerCourse.com  Fact Sheet for Healthcare Providers:  SeriousBroker.it  This test is no t yet approved or cleared by the [place]  FDA and  has been authorized for detection and/or diagnosis of SARS-CoV-2 by FDA under an Emergency Use Authorization (EUA). This EUA will remain  in effect (meaning this test can be used) for the duration of the COVID-19 declaration under Section 564(b)(1) of the Act, 21 U.S.C.section 360bbb-3(b)(1), unless the authorization is terminated  or revoked sooner.       Influenza A by PCR NEGATIVE NEGATIVE Final   Influenza B by PCR NEGATIVE NEGATIVE Final    Comment: (NOTE) The Xpert Xpress SARS-CoV-2/FLU/RSV plus assay is intended as an aid in  the diagnosis of influenza from Nasopharyngeal swab specimens and should not be used as a sole basis for treatment. Nasal washings and aspirates are unacceptable for Xpert Xpress SARS-CoV-2/FLU/RSV testing.  Fact Sheet for Patients: BloggerCourse.com  Fact Sheet for Healthcare Providers: SeriousBroker.it  This test is not yet approved or cleared by the [place]  FDA and has been authorized for detection and/or diagnosis of SARS-CoV-2 by FDA under an Emergency Use Authorization (EUA). This EUA will remain in effect (meaning this test can be used) for the duration of the COVID-19 declaration under Section 564(b)(1) of the Act, 21 U.S.C.  section 360bbb-3(b)(1), unless the authorization is terminated or revoked.     Resp Syncytial Virus by PCR NEGATIVE NEGATIVE Final    Comment: (NOTE) Fact Sheet for Patients: BloggerCourse.com  Fact Sheet for Healthcare Providers: SeriousBroker.it  This test is not yet approved or cleared by the [place]  FDA and has been authorized for detection and/or diagnosis of SARS-CoV-2 by FDA under an Emergency Use Authorization (EUA). This EUA will remain in effect (meaning this test can be used) for the duration of the COVID-19 declaration under Section 564(b)(1) of the Act, 21 U.S.C. section 360bbb-3(b)(1), unless the authorization is terminated or revoked.  Performed at [facility], [address], [place], [place] [address]      Labs: BNP (last 3 results) Recent Labs    01/25/23 1000 09/08/23 1222  BNP 2,267.0* 1,061.0*   Basic Metabolic Panel: Recent Labs  Lab 09/26/23 2248 09/26/23 2250 09/27/23 0242  NA  --  140 139  K  --  6.2* 4.7  CL  --  110 106  CO2  --   --  23  GLUCOSE  --  85 124*  BUN  --  34* 38*  CREATININE  --  6.60* 6.29*  CALCIUM   --   --  8.4*  MG 2.1  --   --   PHOS 5.3*  --   --    Liver Function  Tests: No results for input(s): "AST", "ALT", "ALKPHOS", "BILITOT", "PROT", "ALBUMIN " in the last 168 hours. No results for input(s): "LIPASE", "AMYLASE" in the last 168 hours. No results for input(s): "AMMONIA" in the last 168 hours. CBC: Recent Labs  Lab 09/26/23 2248 09/26/23 2250 09/27/23 0242  WBC 13.5*  --  12.5*  NEUTROABS 10.1*  --   --   HGB 9.1* 9.5* 8.6*  HCT 30.8* 28.0* 26.5*  MCV 97.5  --  91.4  PLT 356  --  304   Cardiac Enzymes: No results for input(s): "CKTOTAL", "CKMB", "CKMBINDEX", "TROPONINI" in the last 168 hours. BNP: Invalid input(s): "POCBNP" CBG: No results for input(s): "GLUCAP" in the last 168 hours. D-Dimer No results for input(s): "DDIMER" in the last 72 hours. Hgb A1c No results for input(s): "HGBA1C" in the last 72 hours. Lipid Profile No results for input(s): "CHOL", "HDL", "LDLCALC", "TRIG", "CHOLHDL", "LDLDIRECT" in the last 72 hours. Thyroid  function studies No results for input(s): "TSH", "T4TOTAL", "T3FREE", "THYROIDAB" in the last 72 hours.  Invalid input(s): "FREET3" Anemia work up No results for input(s): "VITAMINB12", "FOLATE", "FERRITIN", "TIBC", "IRON ", "RETICCTPCT" in the last 72 hours. Urinalysis    Component Value Date/Time   COLORURINE YELLOW 05/28/2022 1005   APPEARANCEUR CLOUDY (A) 05/28/2022 1005   LABSPEC 1.018 05/28/2022 1005   PHURINE 7.0 05/28/2022 1005   GLUCOSEU NEGATIVE 05/28/2022 1005   HGBUR SMALL (A) 05/28/2022 1005   BILIRUBINUR NEGATIVE 05/28/2022 1005   KETONESUR NEGATIVE 05/28/2022 1005   PROTEINUR >=300 (A) 05/28/2022 1005   NITRITE NEGATIVE 05/28/2022 1005   LEUKOCYTESUR MODERATE (A) 05/28/2022 1005   Sepsis Labs Recent Labs  Lab 09/26/23 2248 09/27/23 0242  WBC 13.5* 12.5*   Microbiology Recent Results (from the past 240 hours)  Resp panel by RT-PCR (RSV, Flu A&B, Covid) Anterior Nasal Swab     Status: None   Collection Time: 09/26/23 10:45 PM   Specimen: Anterior Nasal Swab  Result Value  Ref Range Status   SARS Coronavirus 2 by RT PCR NEGATIVE NEGATIVE Final    Comment: (NOTE) SARS-CoV-2 target nucleic acids are NOT  DETECTED.  The SARS-CoV-2 RNA is generally detectable in upper respiratory specimens during the acute phase of infection. The lowest concentration of SARS-CoV-2 viral copies this assay can detect is 138 copies/mL. A negative result does not preclude SARS-Cov-2 infection and should not be used as the sole basis for treatment or other patient management decisions. A negative result may occur with  improper specimen collection/handling, submission of specimen other than nasopharyngeal swab, presence of viral mutation(s) within the areas targeted by this assay, and inadequate number of viral copies(<138 copies/mL). A negative result must be combined with clinical observations, patient history, and epidemiological information. The expected result is Negative.  Fact Sheet for Patients:  BloggerCourse.com  Fact Sheet for Healthcare Providers:  SeriousBroker.it  This test is no t yet approved or cleared by the [place]  FDA and  has been authorized for detection and/or diagnosis of SARS-CoV-2 by FDA under an Emergency Use Authorization (EUA). This EUA will remain  in effect (meaning this test can be used) for the duration of the COVID-19 declaration under Section 564(b)(1) of the Act, 21 U.S.C.section 360bbb-3(b)(1), unless the authorization is terminated  or revoked sooner.       Influenza A by PCR NEGATIVE NEGATIVE Final   Influenza B by PCR NEGATIVE NEGATIVE Final    Comment: (NOTE) The Xpert Xpress SARS-CoV-2/FLU/RSV plus assay is intended as an aid in the diagnosis of influenza from Nasopharyngeal swab specimens and should not be used as a sole basis for treatment. Nasal washings and aspirates are unacceptable for Xpert Xpress SARS-CoV-2/FLU/RSV testing.  Fact Sheet for  Patients: BloggerCourse.com  Fact Sheet for Healthcare Providers: SeriousBroker.it  This test is not yet approved or cleared by the [place]  FDA and has been authorized for detection and/or diagnosis of SARS-CoV-2 by FDA under an Emergency Use Authorization (EUA). This EUA will remain in effect (meaning this test can be used) for the duration of the COVID-19 declaration under Section 564(b)(1) of the Act, 21 U.S.C. section 360bbb-3(b)(1), unless the authorization is terminated or revoked.     Resp Syncytial Virus by PCR NEGATIVE NEGATIVE Final    Comment: (NOTE) Fact Sheet for Patients: BloggerCourse.com  Fact Sheet for Healthcare Providers: SeriousBroker.it  This test is not yet approved or cleared by the [place]  FDA and has been authorized for detection and/or diagnosis of SARS-CoV-2 by FDA under an Emergency Use Authorization (EUA). This EUA will remain in effect (meaning this test can be used) for the duration of the COVID-19 declaration under Section 564(b)(1) of the Act, 21 U.S.C. section 360bbb-3(b)(1), unless the authorization is terminated or revoked.  Performed at [facility], [address] [place], [place] [address]      Time coordinating discharge: 35 minutes  SIGNED:   Cornelius Dill, DO [facility] 09/28/2023, 10:17 AM  If 7PM-7AM, please contact night-coverage [email or web address]

## 2023-09-28 NOTE — Progress Notes (Signed)
 Nephrology Follow-Up Consult note  Outpatient dialysis unit: [facility]   Assessment/Recommendations: Sheri Cooper is a/an 73 y.o. female with a past medical history notable for ESRD on HD admitted with shortness of breath.    # ESRD: Had HD on 5/27. Plan to maintain schedule if she remains inpatient.   # Volume/ hypertension: Some volume overload with high blood pressures now improved with UF. Continue home meds   # Anemia of Chronic Kidney Disease: Hemoglobin 8.6.  On ESA outpatient.  Unclear when she last received.  If she remains in the hospital consider dosing here   # Secondary Hyperparathyroidism/Hyperphosphatemia: Calcium  slightly low at 8.4 and with historically low albumin  likely improved to normal.  Phosphorus at goal at 5.3.   # Vascular access: Left upper extremity AVG with no issues   # Shortness of breath: Likely volume overload and some COPD contributing.  Has improved with UF.  Patient is stable for DC from nephrology perspective   Recommendations conveyed to primary service.    Levorn Reason [facility] 09/28/2023 9:07 AM  ___________________________________________________________  CC: shortness of breath  Interval History/Subjective: Patient tolerated dialysis last night with 3 L removed.  Some cramping in her hands.  Feels much better today with minimal shortness of breath   Medications:  Current Facility-Administered Medications  Medication Dose Route Frequency Provider Last Rate Last Admin   acetaminophen  (TYLENOL ) tablet 1,000 mg  1,000 mg Oral Q6H PRN Arnulfo Larch, MD   1,000 mg at 09/27/23 0040   albuterol  (PROVENTIL ) (2.5 MG/3ML) 0.083% nebulizer solution 2.5 mg  2.5 mg Nebulization Q4H PRN Segars, Jonathan, MD       amLODipine  (NORVASC ) tablet 10 mg  10 mg Oral Daily Segars, Jonathan, MD   10 mg at 09/28/23 8295   Chlorhexidine  Gluconate Cloth 2 % PADS 6 each  6 each Topical Q0600 Clementine Cutting, MD   6 each at  09/28/23 6213   heparin  injection 5,000 Units  5,000 Units Subcutaneous Q8H Segars, Jonathan, MD   5,000 Units at 09/28/23 0547   hydrALAZINE  (APRESOLINE ) tablet 50 mg  50 mg Oral BID Segars, Jonathan, MD   50 mg at 09/28/23 0865   labetalol  (NORMODYNE ) injection 10 mg  10 mg Intravenous Q2H PRN Arnulfo Larch, MD       lidocaine  (PF) (XYLOCAINE ) 1 % injection 5 mL  5 mL Intradermal PRN Clementine Cutting, MD       lidocaine -prilocaine  (EMLA ) cream 1 Application  1 Application Topical PRN Bhandari, Dron Prasad, MD       losartan  (COZAAR ) tablet 50 mg  50 mg Oral Daily Segars, Jonathan, MD   50 mg at 09/28/23 7846   melatonin tablet 6 mg  6 mg Oral QHS PRN Segars, Jonathan, MD   6 mg at 09/27/23 2144   methylPREDNISolone  sodium succinate (SOLU-MEDROL ) 125 mg/2 mL injection 60 mg  60 mg Intravenous Q12H Segars, Jonathan, MD   60 mg at 09/28/23 0019   metoprolol  succinate (TOPROL -XL) 24 hr tablet 50 mg  50 mg Oral Daily Segars, Jonathan, MD   50 mg at 09/28/23 9629   pantoprazole  (PROTONIX ) EC tablet 40 mg  40 mg Oral BID Segars, Jonathan, MD   40 mg at 09/28/23 5284   pentafluoroprop-tetrafluoroeth (GEBAUERS) aerosol 1 Application  1 Application Topical PRN Bhandari, Dron Prasad, MD       polyethylene glycol (MIRALAX / GLYCOLAX) packet 17 g  17 g Oral Daily PRN Arnulfo Larch, MD  sodium chloride  flush (NS) 0.9 % injection 3 mL  3 mL Intravenous Q12H Arnulfo Larch, MD   3 mL at 09/28/23 1478      Review of Systems: 10 systems reviewed and negative except per interval history/subjective  Physical Exam: Vitals:   09/28/23 0751 09/28/23 0834  BP:    Pulse:    Resp:    Temp: (!) 97.3 F (36.3 C)   SpO2:  95%   Total I/O In: 240 [P.O.:240] Out: -   Intake/Output Summary (Last 24 hours) at 09/28/2023 0907 Last data filed at 09/28/2023 0751 Gross per 24 hour  Intake 240 ml  Output 3000 ml  Net -2760 ml   Constitutional: well-appearing, no acute distress ENMT: ears and  nose without scars or lesions, MMM CV: normal rate, no edema Respiratory: Bilateral chest rise, normal work of breathing Gastrointestinal: soft, non-tender, no palpable masses or hernias Skin: no visible lesions or rashes Psych: alert, judgement/insight appropriate, appropriate mood and affect   Test Results I personally reviewed new and old clinical labs and radiology tests Lab Results  Component Value Date   NA 139 09/27/2023   K 4.7 09/27/2023   CL 106 09/27/2023   CO2 23 09/27/2023   BUN 38 (H) 09/27/2023   CREATININE 6.29 (H) 09/27/2023   CALCIUM  8.4 (L) 09/27/2023   ALBUMIN  3.0 (L) 09/14/2023   PHOS 5.3 (H) 09/26/2023    CBC Recent Labs  Lab 09/26/23 2248 09/26/23 2250 09/27/23 0242  WBC 13.5*  --  12.5*  NEUTROABS 10.1*  --   --   HGB 9.1* 9.5* 8.6*  HCT 30.8* 28.0* 26.5*  MCV 97.5  --  91.4  PLT 356  --  304

## 2023-09-29 DIAGNOSIS — Z992 Dependence on renal dialysis: Secondary | ICD-10-CM | POA: Diagnosis not present

## 2023-09-29 DIAGNOSIS — N186 End stage renal disease: Secondary | ICD-10-CM | POA: Diagnosis not present

## 2023-10-01 DIAGNOSIS — N186 End stage renal disease: Secondary | ICD-10-CM | POA: Diagnosis not present

## 2023-10-01 DIAGNOSIS — Z992 Dependence on renal dialysis: Secondary | ICD-10-CM | POA: Diagnosis not present

## 2023-10-03 DIAGNOSIS — E782 Mixed hyperlipidemia: Secondary | ICD-10-CM | POA: Diagnosis not present

## 2023-10-03 DIAGNOSIS — K219 Gastro-esophageal reflux disease without esophagitis: Secondary | ICD-10-CM | POA: Diagnosis not present

## 2023-10-03 DIAGNOSIS — N186 End stage renal disease: Secondary | ICD-10-CM | POA: Diagnosis not present

## 2023-10-03 DIAGNOSIS — I10 Essential (primary) hypertension: Secondary | ICD-10-CM | POA: Diagnosis not present

## 2023-10-04 DIAGNOSIS — N186 End stage renal disease: Secondary | ICD-10-CM | POA: Diagnosis not present

## 2023-10-04 DIAGNOSIS — Z992 Dependence on renal dialysis: Secondary | ICD-10-CM | POA: Diagnosis not present

## 2023-10-06 DIAGNOSIS — Z992 Dependence on renal dialysis: Secondary | ICD-10-CM | POA: Diagnosis not present

## 2023-10-06 DIAGNOSIS — N186 End stage renal disease: Secondary | ICD-10-CM | POA: Diagnosis not present

## 2023-10-08 DIAGNOSIS — N186 End stage renal disease: Secondary | ICD-10-CM | POA: Diagnosis not present

## 2023-10-08 DIAGNOSIS — Z992 Dependence on renal dialysis: Secondary | ICD-10-CM | POA: Diagnosis not present

## 2023-10-11 DIAGNOSIS — N186 End stage renal disease: Secondary | ICD-10-CM | POA: Diagnosis not present

## 2023-10-11 DIAGNOSIS — Z992 Dependence on renal dialysis: Secondary | ICD-10-CM | POA: Diagnosis not present

## 2023-10-13 DIAGNOSIS — N186 End stage renal disease: Secondary | ICD-10-CM | POA: Diagnosis not present

## 2023-10-13 DIAGNOSIS — Z992 Dependence on renal dialysis: Secondary | ICD-10-CM | POA: Diagnosis not present

## 2023-10-15 DIAGNOSIS — N186 End stage renal disease: Secondary | ICD-10-CM | POA: Diagnosis not present

## 2023-10-15 DIAGNOSIS — Z992 Dependence on renal dialysis: Secondary | ICD-10-CM | POA: Diagnosis not present

## 2023-10-18 DIAGNOSIS — N186 End stage renal disease: Secondary | ICD-10-CM | POA: Diagnosis not present

## 2023-10-18 DIAGNOSIS — Z992 Dependence on renal dialysis: Secondary | ICD-10-CM | POA: Diagnosis not present

## 2023-10-20 DIAGNOSIS — Z992 Dependence on renal dialysis: Secondary | ICD-10-CM | POA: Diagnosis not present

## 2023-10-20 DIAGNOSIS — N186 End stage renal disease: Secondary | ICD-10-CM | POA: Diagnosis not present

## 2023-10-22 DIAGNOSIS — Z992 Dependence on renal dialysis: Secondary | ICD-10-CM | POA: Diagnosis not present

## 2023-10-22 DIAGNOSIS — N186 End stage renal disease: Secondary | ICD-10-CM | POA: Diagnosis not present

## 2023-10-25 DIAGNOSIS — N186 End stage renal disease: Secondary | ICD-10-CM | POA: Diagnosis not present

## 2023-10-25 DIAGNOSIS — Z992 Dependence on renal dialysis: Secondary | ICD-10-CM | POA: Diagnosis not present

## 2023-10-27 DIAGNOSIS — Z992 Dependence on renal dialysis: Secondary | ICD-10-CM | POA: Diagnosis not present

## 2023-10-27 DIAGNOSIS — N186 End stage renal disease: Secondary | ICD-10-CM | POA: Diagnosis not present

## 2023-10-28 DIAGNOSIS — I10 Essential (primary) hypertension: Secondary | ICD-10-CM | POA: Diagnosis not present

## 2023-10-28 DIAGNOSIS — Z992 Dependence on renal dialysis: Secondary | ICD-10-CM | POA: Diagnosis not present

## 2023-10-28 DIAGNOSIS — E782 Mixed hyperlipidemia: Secondary | ICD-10-CM | POA: Diagnosis not present

## 2023-10-28 DIAGNOSIS — D638 Anemia in other chronic diseases classified elsewhere: Secondary | ICD-10-CM | POA: Diagnosis not present

## 2023-10-29 DIAGNOSIS — N186 End stage renal disease: Secondary | ICD-10-CM | POA: Diagnosis not present

## 2023-10-29 DIAGNOSIS — Z992 Dependence on renal dialysis: Secondary | ICD-10-CM | POA: Diagnosis not present

## 2023-10-31 DIAGNOSIS — N186 End stage renal disease: Secondary | ICD-10-CM | POA: Diagnosis not present

## 2023-10-31 DIAGNOSIS — Z992 Dependence on renal dialysis: Secondary | ICD-10-CM | POA: Diagnosis not present

## 2023-11-01 DIAGNOSIS — N186 End stage renal disease: Secondary | ICD-10-CM | POA: Diagnosis not present

## 2023-11-01 DIAGNOSIS — E782 Mixed hyperlipidemia: Secondary | ICD-10-CM | POA: Diagnosis not present

## 2023-11-01 DIAGNOSIS — I10 Essential (primary) hypertension: Secondary | ICD-10-CM | POA: Diagnosis not present

## 2023-11-01 DIAGNOSIS — Z992 Dependence on renal dialysis: Secondary | ICD-10-CM | POA: Diagnosis not present

## 2023-11-01 DIAGNOSIS — J449 Chronic obstructive pulmonary disease, unspecified: Secondary | ICD-10-CM | POA: Diagnosis not present

## 2023-11-03 DIAGNOSIS — N186 End stage renal disease: Secondary | ICD-10-CM | POA: Diagnosis not present

## 2023-11-03 DIAGNOSIS — Z992 Dependence on renal dialysis: Secondary | ICD-10-CM | POA: Diagnosis not present

## 2023-11-05 DIAGNOSIS — N186 End stage renal disease: Secondary | ICD-10-CM | POA: Diagnosis not present

## 2023-11-05 DIAGNOSIS — Z992 Dependence on renal dialysis: Secondary | ICD-10-CM | POA: Diagnosis not present

## 2023-11-07 DIAGNOSIS — K219 Gastro-esophageal reflux disease without esophagitis: Secondary | ICD-10-CM | POA: Diagnosis not present

## 2023-11-07 DIAGNOSIS — J449 Chronic obstructive pulmonary disease, unspecified: Secondary | ICD-10-CM | POA: Diagnosis not present

## 2023-11-07 DIAGNOSIS — D649 Anemia, unspecified: Secondary | ICD-10-CM | POA: Diagnosis not present

## 2023-11-07 DIAGNOSIS — N186 End stage renal disease: Secondary | ICD-10-CM | POA: Diagnosis not present

## 2023-11-07 DIAGNOSIS — I10 Essential (primary) hypertension: Secondary | ICD-10-CM | POA: Diagnosis not present

## 2023-11-07 DIAGNOSIS — E782 Mixed hyperlipidemia: Secondary | ICD-10-CM | POA: Diagnosis not present

## 2023-11-08 DIAGNOSIS — Z992 Dependence on renal dialysis: Secondary | ICD-10-CM | POA: Diagnosis not present

## 2023-11-08 DIAGNOSIS — N186 End stage renal disease: Secondary | ICD-10-CM | POA: Diagnosis not present

## 2023-11-10 DIAGNOSIS — N186 End stage renal disease: Secondary | ICD-10-CM | POA: Diagnosis not present

## 2023-11-10 DIAGNOSIS — Z992 Dependence on renal dialysis: Secondary | ICD-10-CM | POA: Diagnosis not present

## 2023-11-12 DIAGNOSIS — Z992 Dependence on renal dialysis: Secondary | ICD-10-CM | POA: Diagnosis not present

## 2023-11-12 DIAGNOSIS — N186 End stage renal disease: Secondary | ICD-10-CM | POA: Diagnosis not present

## 2023-11-15 DIAGNOSIS — Z992 Dependence on renal dialysis: Secondary | ICD-10-CM | POA: Diagnosis not present

## 2023-11-15 DIAGNOSIS — N186 End stage renal disease: Secondary | ICD-10-CM | POA: Diagnosis not present

## 2023-11-17 DIAGNOSIS — Z992 Dependence on renal dialysis: Secondary | ICD-10-CM | POA: Diagnosis not present

## 2023-11-17 DIAGNOSIS — N186 End stage renal disease: Secondary | ICD-10-CM | POA: Diagnosis not present

## 2023-11-19 DIAGNOSIS — N186 End stage renal disease: Secondary | ICD-10-CM | POA: Diagnosis not present

## 2023-11-19 DIAGNOSIS — Z992 Dependence on renal dialysis: Secondary | ICD-10-CM | POA: Diagnosis not present

## 2023-11-22 DIAGNOSIS — N186 End stage renal disease: Secondary | ICD-10-CM | POA: Diagnosis not present

## 2023-11-22 DIAGNOSIS — Z992 Dependence on renal dialysis: Secondary | ICD-10-CM | POA: Diagnosis not present

## 2023-11-23 DIAGNOSIS — I10 Essential (primary) hypertension: Secondary | ICD-10-CM | POA: Diagnosis not present

## 2023-11-23 DIAGNOSIS — E039 Hypothyroidism, unspecified: Secondary | ICD-10-CM | POA: Diagnosis not present

## 2023-11-23 DIAGNOSIS — E7849 Other hyperlipidemia: Secondary | ICD-10-CM | POA: Diagnosis not present

## 2023-11-23 DIAGNOSIS — Z0001 Encounter for general adult medical examination with abnormal findings: Secondary | ICD-10-CM | POA: Diagnosis not present

## 2023-11-23 DIAGNOSIS — D638 Anemia in other chronic diseases classified elsewhere: Secondary | ICD-10-CM | POA: Diagnosis not present

## 2023-11-24 DIAGNOSIS — Z992 Dependence on renal dialysis: Secondary | ICD-10-CM | POA: Diagnosis not present

## 2023-11-24 DIAGNOSIS — N186 End stage renal disease: Secondary | ICD-10-CM | POA: Diagnosis not present

## 2023-11-26 DIAGNOSIS — N186 End stage renal disease: Secondary | ICD-10-CM | POA: Diagnosis not present

## 2023-11-26 DIAGNOSIS — Z992 Dependence on renal dialysis: Secondary | ICD-10-CM | POA: Diagnosis not present

## 2023-11-29 DIAGNOSIS — Z992 Dependence on renal dialysis: Secondary | ICD-10-CM | POA: Diagnosis not present

## 2023-11-29 DIAGNOSIS — N186 End stage renal disease: Secondary | ICD-10-CM | POA: Diagnosis not present

## 2023-12-01 DIAGNOSIS — N186 End stage renal disease: Secondary | ICD-10-CM | POA: Diagnosis not present

## 2023-12-01 DIAGNOSIS — Z992 Dependence on renal dialysis: Secondary | ICD-10-CM | POA: Diagnosis not present

## 2023-12-02 DIAGNOSIS — J449 Chronic obstructive pulmonary disease, unspecified: Secondary | ICD-10-CM | POA: Diagnosis not present

## 2023-12-02 DIAGNOSIS — Z992 Dependence on renal dialysis: Secondary | ICD-10-CM | POA: Diagnosis not present

## 2023-12-02 DIAGNOSIS — I10 Essential (primary) hypertension: Secondary | ICD-10-CM | POA: Diagnosis not present

## 2023-12-02 DIAGNOSIS — E782 Mixed hyperlipidemia: Secondary | ICD-10-CM | POA: Diagnosis not present

## 2023-12-02 DIAGNOSIS — N186 End stage renal disease: Secondary | ICD-10-CM | POA: Diagnosis not present

## 2023-12-03 ENCOUNTER — Emergency Department (HOSPITAL_COMMUNITY)

## 2023-12-03 ENCOUNTER — Other Ambulatory Visit: Payer: Self-pay

## 2023-12-03 ENCOUNTER — Observation Stay (HOSPITAL_COMMUNITY)
Admission: EM | Admit: 2023-12-03 | Discharge: 2023-12-03 | Disposition: A | Discharge status: Home / Self Care | Attending: Student | Admitting: Student

## 2023-12-03 ENCOUNTER — Encounter (HOSPITAL_COMMUNITY): Payer: Self-pay

## 2023-12-03 DIAGNOSIS — I120 Hypertensive chronic kidney disease with stage 5 chronic kidney disease or end stage renal disease: Secondary | ICD-10-CM | POA: Insufficient documentation

## 2023-12-03 DIAGNOSIS — J441 Chronic obstructive pulmonary disease with (acute) exacerbation: Secondary | ICD-10-CM

## 2023-12-03 DIAGNOSIS — I10 Essential (primary) hypertension: Secondary | ICD-10-CM | POA: Diagnosis not present

## 2023-12-03 DIAGNOSIS — R0989 Other specified symptoms and signs involving the circulatory and respiratory systems: Secondary | ICD-10-CM | POA: Diagnosis not present

## 2023-12-03 DIAGNOSIS — N186 End stage renal disease: Principal | ICD-10-CM | POA: Insufficient documentation

## 2023-12-03 DIAGNOSIS — J449 Chronic obstructive pulmonary disease, unspecified: Secondary | ICD-10-CM | POA: Diagnosis not present

## 2023-12-03 DIAGNOSIS — M06041 Rheumatoid arthritis without rheumatoid factor, right hand: Secondary | ICD-10-CM | POA: Diagnosis not present

## 2023-12-03 DIAGNOSIS — M06042 Rheumatoid arthritis without rheumatoid factor, left hand: Secondary | ICD-10-CM | POA: Diagnosis not present

## 2023-12-03 DIAGNOSIS — E8770 Fluid overload, unspecified: Secondary | ICD-10-CM

## 2023-12-03 DIAGNOSIS — R0602 Shortness of breath: Secondary | ICD-10-CM | POA: Diagnosis not present

## 2023-12-03 DIAGNOSIS — Z992 Dependence on renal dialysis: Secondary | ICD-10-CM | POA: Diagnosis not present

## 2023-12-03 DIAGNOSIS — J811 Chronic pulmonary edema: Secondary | ICD-10-CM | POA: Diagnosis not present

## 2023-12-03 DIAGNOSIS — R531 Weakness: Secondary | ICD-10-CM | POA: Diagnosis not present

## 2023-12-03 DIAGNOSIS — R918 Other nonspecific abnormal finding of lung field: Secondary | ICD-10-CM | POA: Diagnosis not present

## 2023-12-03 DIAGNOSIS — J90 Pleural effusion, not elsewhere classified: Principal | ICD-10-CM

## 2023-12-03 DIAGNOSIS — I132 Hypertensive heart and chronic kidney disease with heart failure and with stage 5 chronic kidney disease, or end stage renal disease: Secondary | ICD-10-CM | POA: Diagnosis not present

## 2023-12-03 DIAGNOSIS — R0600 Dyspnea, unspecified: Secondary | ICD-10-CM | POA: Diagnosis not present

## 2023-12-03 DIAGNOSIS — K279 Peptic ulcer, site unspecified, unspecified as acute or chronic, without hemorrhage or perforation: Secondary | ICD-10-CM | POA: Diagnosis not present

## 2023-12-03 DIAGNOSIS — R0902 Hypoxemia: Secondary | ICD-10-CM

## 2023-12-03 LAB — COMPREHENSIVE METABOLIC PANEL WITH GFR
ALT: 6 U/L (ref 0–44)
AST: 14 U/L — ABNORMAL LOW (ref 15–41)
Albumin: 3.1 g/dL — ABNORMAL LOW (ref 3.5–5.0)
Alkaline Phosphatase: 89 U/L (ref 38–126)
Anion gap: 15 (ref 5–15)
BUN: 29 mg/dL — ABNORMAL HIGH (ref 8–23)
CO2: 24 mmol/L (ref 22–32)
Calcium: 9.1 mg/dL (ref 8.9–10.3)
Chloride: 104 mmol/L (ref 98–111)
Creatinine, Ser: 6.75 mg/dL — ABNORMAL HIGH (ref 0.44–1.00)
GFR, Estimated: 6 mL/min — ABNORMAL LOW (ref >60)
Glucose, Bld: 87 mg/dL (ref 70–99)
Potassium: 4.1 mmol/L (ref 3.5–5.1)
Sodium: 143 mmol/L (ref 135–145)
Total Bilirubin: 0.7 mg/dL (ref 0.0–1.2)
Total Protein: 6.8 g/dL (ref 6.5–8.1)

## 2023-12-03 LAB — BASIC METABOLIC PANEL WITH GFR
Anion gap: 15 (ref 5–15)
BUN: 29 mg/dL — ABNORMAL HIGH (ref 8–23)
CO2: 21 mmol/L — ABNORMAL LOW (ref 22–32)
Calcium: 8.8 mg/dL — ABNORMAL LOW (ref 8.9–10.3)
Chloride: 106 mmol/L (ref 98–111)
Creatinine, Ser: 6.81 mg/dL — ABNORMAL HIGH (ref 0.44–1.00)
GFR, Estimated: 6 mL/min — ABNORMAL LOW (ref >60)
Glucose, Bld: 124 mg/dL — ABNORMAL HIGH (ref 70–99)
Potassium: 3.8 mmol/L (ref 3.5–5.1)
Sodium: 142 mmol/L (ref 135–145)

## 2023-12-03 LAB — CBC
HCT: 28.2 % — ABNORMAL LOW (ref 36.0–46.0)
Hemoglobin: 8.9 g/dL — ABNORMAL LOW (ref 12.0–15.0)
MCH: 27.3 pg (ref 26.0–34.0)
MCHC: 31.6 g/dL (ref 30.0–36.0)
MCV: 86.5 fL (ref 80.0–100.0)
Platelets: 350 K/uL (ref 150–400)
RBC: 3.26 MIL/uL — ABNORMAL LOW (ref 3.87–5.11)
RDW: 18.5 % — ABNORMAL HIGH (ref 11.5–15.5)
WBC: 13.6 K/uL — ABNORMAL HIGH (ref 4.0–10.5)
nRBC: 0 % (ref 0.0–0.2)

## 2023-12-03 LAB — CBC WITH DIFFERENTIAL/PLATELET
Abs Immature Granulocytes: 0.07 K/uL (ref 0.00–0.07)
Basophils Absolute: 0.1 K/uL (ref 0.0–0.1)
Basophils Relative: 1 %
Eosinophils Absolute: 0.2 K/uL (ref 0.0–0.5)
Eosinophils Relative: 2 %
HCT: 27.8 % — ABNORMAL LOW (ref 36.0–46.0)
Hemoglobin: 8.8 g/dL — ABNORMAL LOW (ref 12.0–15.0)
Immature Granulocytes: 1 %
Lymphocytes Relative: 18 %
Lymphs Abs: 2.2 K/uL (ref 0.7–4.0)
MCH: 27.2 pg (ref 26.0–34.0)
MCHC: 31.7 g/dL (ref 30.0–36.0)
MCV: 85.8 fL (ref 80.0–100.0)
Monocytes Absolute: 0.8 K/uL (ref 0.1–1.0)
Monocytes Relative: 7 %
Neutro Abs: 8.5 K/uL — ABNORMAL HIGH (ref 1.7–7.7)
Neutrophils Relative %: 71 %
Platelets: 356 K/uL (ref 150–400)
RBC: 3.24 MIL/uL — ABNORMAL LOW (ref 3.87–5.11)
RDW: 18.4 % — ABNORMAL HIGH (ref 11.5–15.5)
WBC: 11.9 K/uL — ABNORMAL HIGH (ref 4.0–10.5)
nRBC: 0 % (ref 0.0–0.2)

## 2023-12-03 LAB — TROPONIN I (HIGH SENSITIVITY)
Troponin I (High Sensitivity): 90 ng/L — ABNORMAL HIGH (ref <18)
Troponin I (High Sensitivity): 107 ng/L (ref <18)

## 2023-12-03 LAB — BRAIN NATRIURETIC PEPTIDE: B Natriuretic Peptide: 1645 pg/mL — ABNORMAL HIGH (ref 0.0–100.0)

## 2023-12-03 LAB — MRSA NEXT GEN BY PCR, NASAL: MRSA by PCR Next Gen: NOT DETECTED

## 2023-12-03 LAB — HEPATITIS B SURFACE ANTIGEN: Hepatitis B Surface Ag: NONREACTIVE

## 2023-12-03 MED ORDER — HYDRALAZINE HCL 50 MG PO TABS
50.0000 mg | ORAL_TABLET | Freq: Two times a day (BID) | ORAL | Status: DC
  Administered 2023-12-03: 50 mg via ORAL
  Filled 2023-12-03: qty 1

## 2023-12-03 MED ORDER — ALBUTEROL SULFATE HFA 108 (90 BASE) MCG/ACT IN AERS: 2.0000 | INHALATION_SPRAY | Freq: Four times a day (QID) | RESPIRATORY_TRACT | Status: DC | PRN

## 2023-12-03 MED ORDER — IPRATROPIUM-ALBUTEROL 0.5-2.5 (3) MG/3ML IN SOLN
9.0000 mL | Freq: Once | RESPIRATORY_TRACT | Status: AC
  Administered 2023-12-03: 9 mL via RESPIRATORY_TRACT
  Filled 2023-12-03: qty 9

## 2023-12-03 MED ORDER — LOSARTAN POTASSIUM 50 MG PO TABS
50.0000 mg | ORAL_TABLET | Freq: Every day | ORAL | Status: DC
  Administered 2023-12-03: 50 mg via ORAL
  Filled 2023-12-03: qty 1

## 2023-12-03 MED ORDER — CHLORHEXIDINE GLUCONATE CLOTH 2 % EX PADS: 6.0000 | MEDICATED_PAD | Freq: Every day | CUTANEOUS | Status: DC

## 2023-12-03 MED ORDER — AMLODIPINE BESYLATE 5 MG PO TABS
10.0000 mg | ORAL_TABLET | Freq: Every day | ORAL | Status: DC
  Administered 2023-12-03: 10 mg via ORAL
  Filled 2023-12-03: qty 2

## 2023-12-03 MED ORDER — HYDRALAZINE HCL 20 MG/ML IJ SOLN: 10.0000 mg | INTRAMUSCULAR | Status: DC | PRN

## 2023-12-03 MED ORDER — METOPROLOL SUCCINATE ER 50 MG PO TB24
50.0000 mg | ORAL_TABLET | Freq: Every day | ORAL | Status: DC
  Administered 2023-12-03: 50 mg via ORAL
  Filled 2023-12-03: qty 1

## 2023-12-03 MED ORDER — ONDANSETRON HCL 4 MG PO TABS: 4.0000 mg | ORAL_TABLET | Freq: Four times a day (QID) | ORAL | Status: DC | PRN

## 2023-12-03 MED ORDER — ACETAMINOPHEN 325 MG PO TABS: 650.0000 mg | ORAL_TABLET | Freq: Four times a day (QID) | ORAL | Status: DC | PRN

## 2023-12-03 MED ORDER — ONDANSETRON HCL 4 MG/2ML IJ SOLN: 4.0000 mg | Freq: Four times a day (QID) | INTRAMUSCULAR | Status: DC | PRN

## 2023-12-03 MED ORDER — ACETAMINOPHEN 650 MG RE SUPP: 650.0000 mg | Freq: Four times a day (QID) | RECTAL | Status: DC | PRN

## 2023-12-03 MED ORDER — PANTOPRAZOLE SODIUM 40 MG PO TBEC
40.0000 mg | DELAYED_RELEASE_TABLET | Freq: Two times a day (BID) | ORAL | Status: DC
  Administered 2023-12-03: 40 mg via ORAL
  Filled 2023-12-03: qty 1

## 2023-12-03 MED ORDER — CHLORHEXIDINE GLUCONATE CLOTH 2 % EX PADS
6.0000 | MEDICATED_PAD | Freq: Every day | CUTANEOUS | Status: DC
  Administered 2023-12-03: 6 via TOPICAL

## 2023-12-03 MED ORDER — HEPARIN SODIUM (PORCINE) 5000 UNIT/ML IJ SOLN
5000.0000 [IU] | Freq: Three times a day (TID) | INTRAMUSCULAR | Status: DC
  Administered 2023-12-03: 5000 [IU] via SUBCUTANEOUS
  Filled 2023-12-03: qty 1

## 2023-12-03 MED ORDER — FERROUS SULFATE 325 (65 FE) MG PO TBEC
325.0000 mg | DELAYED_RELEASE_TABLET | ORAL | Status: DC
Start: 2023-12-05 — End: 2023-12-03

## 2023-12-03 MED ORDER — SEVELAMER CARBONATE 800 MG PO TABS
2400.0000 mg | ORAL_TABLET | Freq: Three times a day (TID) | ORAL | Status: DC
  Administered 2023-12-03: 2400 mg via ORAL
  Filled 2023-12-03: qty 3

## 2023-12-03 MED ORDER — ALBUTEROL SULFATE (2.5 MG/3ML) 0.083% IN NEBU: 2.5000 mg | INHALATION_SOLUTION | Freq: Four times a day (QID) | RESPIRATORY_TRACT | Status: DC | PRN

## 2023-12-03 MED ORDER — FUROSEMIDE 10 MG/ML IJ SOLN
80.0000 mg | Freq: Once | INTRAMUSCULAR | Status: AC
  Administered 2023-12-03: 80 mg via INTRAVENOUS
  Filled 2023-12-03: qty 8

## 2023-12-03 MED ORDER — METHYLPREDNISOLONE SODIUM SUCC 125 MG IJ SOLR
125.0000 mg | Freq: Once | INTRAMUSCULAR | Status: AC
  Administered 2023-12-03: 125 mg via INTRAVENOUS
  Filled 2023-12-03: qty 2

## 2023-12-03 NOTE — Assessment & Plan Note (Addendum)
 Positive volume overload.   Patient with no acidosis, uremic symptoms or hyperkalemia.  02 requirements at 2 L/min per [facility] to keep 02 saturation above 92% Patient still makes urine   Plan for renal replacement therapy with ultrafiltration in the hospital today.  Add furosemide  80 mg IV once Will follow up with nephrology recommendations.   Anemia of chronic renal disease, with iron  deficiency.  Continue iron  supplementation   Metabolic bone disease, continue with sevelamer ,

## 2023-12-03 NOTE — ED Triage Notes (Addendum)
 Patient from home for shortness of breath and productive cough that started this afternoon. Patient reports coughing up clear phlegm. Patient has dialysis TTS; went Thursday but was cut 1 hour short. Patient has history of COPD. Upon arrival to ER, patient is alert and oriented; denies pain. No sticks/BP to L arm

## 2023-12-03 NOTE — Progress Notes (Signed)
 Brief Nephrology Note  Patient known to me and our service. Here with SOB and volume excess; sounds like not appropriately challenged on EDW this week. Will provide HD today and see formally when able.

## 2023-12-03 NOTE — Discharge Summary (Signed)
 Physician Discharge Summary  Sheri Cooper FMW:[id number] DOB: 02/03/1951 DOA: 12/03/2023  PCP: Toribio Jerel MATSU, MD  Admit date: 12/03/2023  Discharge date: 12/03/2023  Admitted From:Home  Disposition:  Home  Recommendations for Outpatient Follow-up:  Follow up with PCP in 1-2 weeks Continue dialysis per routine TTS Continue home medications as prior  Home Health: None  Equipment/Devices: None  Discharge Condition:Stable  CODE STATUS: Full  Diet recommendation: Renal diet  Brief/Interim Summary: Sheri Cooper is a 73 y.o. 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    Recent hospitalization 05/26 to 09/28/23 for volume overload, she had inpatient renal replacement therapy with ultrafiltration with improvement in her symptoms.    This week on Tuesday patient had hemodialysis but not ultrafiltration due to being at target dry weight (even lower per patient report), on Thursday her treatment was cut one hour short due to malfunctioning dialysis machine.  The following day, Friday, during the afternoon she developed acute and rapidly worsening dyspnea, worse with movement, with no improving factors and associated with cough.   Patient was admitted with volume overload in the setting of ESRD and underwent hemodialysis with 1 dose of IV Lasix  during this hospitalization.  She is now in stable condition for discharge and overall feels much better and does not have any shortness of breath or other symptomatology.  No other acute events or concerns noted during this brief admission.  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  Discharge Instructions  Discharge Instructions     Diet - low sodium heart healthy   Complete by: As directed    Increase activity slowly   Complete by: As directed       Allergies as of 12/03/2023   No Known Allergies      Medication List     TAKE these medications    albuterol  108 (90 Base) MCG/ACT inhaler Commonly known as: VENTOLIN  HFA Inhale 2 puffs into the lungs every 6 (six) hours as needed for wheezing or shortness of breath.   amLODipine  10 MG tablet Commonly known as: NORVASC  Take 10 mg by mouth daily.   b complex-vitamin c-folic acid  0.8 MG Tabs tablet Take 1 tablet by mouth at bedtime.   ferrous sulfate  325 (65 FE) MG EC tablet Take 325 mg by mouth 3 (three) times a week.   hydrALAZINE  50 MG tablet Commonly known as: APRESOLINE  Take 1 tablet (50 mg total) by mouth 2 (two) times daily.   losartan  50 MG tablet Commonly known as: COZAAR  Take 1 tablet (50 mg total) by mouth daily.   metoprolol  succinate 50 MG 24 hr tablet Commonly known as: TOPROL -XL Take 50 mg by mouth daily.   pantoprazole  40 MG tablet Commonly known as: Protonix  Take 1 tablet (40 mg total) by mouth 2 (two) times daily.   sevelamer  carbonate 800 MG tablet Commonly known as: RENVELA  Take 2,400 mg by mouth 3 (three) times daily with meals.        Follow-up Information     Toribio Jerel MATSU, MD. Schedule an appointment as soon as possible for a visit in 1  week(s).   Specialty: Family Medicine Contact information: [address] [place] [address] [phone]                No Known Allergies  Consultations: Nephrology   Procedures/Studies: DG Chest Portable 1 View Result Date: 12/03/2023 EXAM: 1 VIEW XRAY OF THE CHEST 12/03/2023 03:18:00 AM COMPARISON: 09/26/2023 CLINICAL HISTORY: Dyspnea. FINDINGS: LUNGS AND PLEURA: Moderate bilateral pleural effusions. Pulmonary vascular  congestion. Interstitial edema. Bibasilar airspace opacities. No pneumothorax. HEART AND MEDIASTINUM: Stable cardiomegaly. Aortic atherosclerotic calcification. BONES AND SOFT TISSUES: No acute osseous abnormality. IMPRESSION: 1. Findings most suggestive of CHF with moderate bilateral pleural effusions similar to prior. Multifocal pneumonia could appear similarly. Electronically signed by: Norman Gatlin MD 12/03/2023 03:26 AM EDT RP Workstation: [id number]     Discharge Exam: Vitals:   12/03/23 1430 12/03/23 1511  BP: (!) 150/70   Pulse: 88 96  Resp: 20 17  Temp: 98.2 F (36.8 C)   SpO2: 100% 100%   Vitals:   12/03/23 1400 12/03/23 1430 12/03/23 1449 12/03/23 1511  BP: 122/84 (!) 150/70    Pulse: 90 88  96  Resp: (!) 27 20  17   Temp:  98.2 F (36.8 C)    TempSrc:  Oral    SpO2: 100% 100%  100%  Weight:   41.5 kg   Height:        General: Pt is alert, awake, not in acute distress Cardiovascular: RRR, S1/S2 +, no rubs, no gallops Respiratory: CTA bilaterally, no wheezing, no rhonchi Abdominal: Soft, NT, ND, bowel sounds + Extremities: no edema, no cyanosis    The results of significant diagnostics from this hospitalization (including imaging, microbiology, ancillary and laboratory) are listed below for reference.     Microbiology: No results found for this or any previous visit (from the past 240 hours).   Labs: BNP (last 3 results) Recent Labs    01/25/23 1000 09/08/23 1222 12/03/23 0319  BNP 2,267.0* 1,061.0* 1,645.0*   Basic Metabolic Panel: Recent Labs  Lab 12/03/23 0319 12/03/23 0553  NA 143 142  K 4.1 3.8  CL 104 106  CO2 24 21*  GLUCOSE 87 124*  BUN 29* 29*  CREATININE 6.75* 6.81*  CALCIUM  9.1 8.8*   Liver Function Tests: Recent Labs  Lab 12/03/23 0319  AST 14*  ALT 6  ALKPHOS 89  BILITOT 0.7  PROT 6.8  ALBUMIN  3.1*   No results for input(s): LIPASE, AMYLASE in the last 168 hours. No results for input(s): AMMONIA in the last 168  hours. CBC: Recent Labs  Lab 12/03/23 0319 12/03/23 0553  WBC 11.9* 13.6*  NEUTROABS 8.5*  --   HGB 8.8* 8.9*  HCT 27.8* 28.2*  MCV 85.8 86.5  PLT 356 350   Cardiac Enzymes: No results for input(s): CKTOTAL, CKMB, CKMBINDEX, TROPONINI in the last 168 hours. BNP: Invalid input(s): POCBNP CBG: No results for input(s): GLUCAP in the last 168 hours. D-Dimer No results for input(s): DDIMER in the last 72 hours. Hgb A1c No results for input(s): HGBA1C in the last 72 hours. Lipid Profile No results for input(s): CHOL, HDL, LDLCALC, TRIG, CHOLHDL, LDLDIRECT in the last 72 hours. Thyroid  function studies No results for input(s): TSH, T4TOTAL, T3FREE, THYROIDAB in the last 72 hours.  Invalid input(s): FREET3 Anemia work up No results for input(s): VITAMINB12, FOLATE, FERRITIN, TIBC, IRON , RETICCTPCT in the last 72 hours. Urinalysis    Component Value Date/Time   COLORURINE YELLOW 05/28/2022 1005   APPEARANCEUR CLOUDY (A) 05/28/2022  1005   LABSPEC 1.018 05/28/2022 1005   PHURINE 7.0 05/28/2022 1005   GLUCOSEU NEGATIVE 05/28/2022 1005   HGBUR SMALL (A) 05/28/2022 1005   BILIRUBINUR NEGATIVE 05/28/2022 1005   KETONESUR NEGATIVE 05/28/2022 1005   PROTEINUR >=300 (A) 05/28/2022 1005   NITRITE NEGATIVE 05/28/2022 1005   LEUKOCYTESUR MODERATE (A) 05/28/2022 1005   Sepsis Labs Recent Labs  Lab 12/03/23 0319 12/03/23 0553  WBC 11.9* 13.6*   Microbiology No results found for this or any previous visit (from the past 240 hours).   Time coordinating discharge: 35 minutes  SIGNED:   Adron JONETTA Fairly, DO [facility] 12/03/2023, 3:16 PM  If 7PM-7AM, please contact night-coverage [email or web address]

## 2023-12-03 NOTE — ED Notes (Signed)
 Portable xray at bedside.

## 2023-12-03 NOTE — Assessment & Plan Note (Signed)
No signs of acute flare.

## 2023-12-03 NOTE — ED Provider Notes (Signed)
 [place] EMERGENCY DEPARTMENT AT [facility] Provider Note  CSN: [id number] Arrival date & time: 12/03/23 0300  Chief Complaint(s) Shortness of Breath  HPI Sheri Cooper is a 73 y.o. female with PMH ESRD on hemodialysis Tuesday Thursday Saturday, COPD, RA, peptic ulcer disease who presents emerged part for evaluation of shortness of breath.  States that symptoms began approximate 24 hours ago and she was awoken from sleep very short of breath.  States that her last dialysis session 2 days ago there was a problem with the machine and she only received a half treatment.  Also states that she is out of her inhalers.  Denies chest pain, abdominal pain, nausea, vomiting, headache, fever or other systemic symptoms   Past Medical History Past Medical History:  Diagnosis Date  . Anemia   . Chronic kidney disease (CKD), stage IV (severe) (HCC)   . COPD (chronic obstructive pulmonary disease) (HCC)   . ESRD (end stage renal disease) (HCC)    Hemodialysis T, TH, SAT, [address] , [place]  . GERD (gastroesophageal reflux disease)   . History of blood transfusion   . History of blood transfusion   . Hypertension   . Pneumonia    3 times in 2024-  . PUD (peptic ulcer disease) 05/2022  . Rheumatoid arthritis (HCC)   . Rheumatoid arthritis [facility])    Patient Active Problem List   Diagnosis Date Noted  . Hyperkalemia 09/27/2023  . Volume overload 09/26/2023  . Acute on chronic heart failure with preserved ejection fraction (HFpEF) (HCC) 09/14/2023  . Pneumonia 09/08/2023  . Fluid overload 01/25/2023  . COPD with acute exacerbation (HCC) 01/25/2023  . Primary localized osteoarthrosis of multiple sites 09/14/2022  . Weight loss 09/14/2022  . Acute respiratory failure with hypoxia (HCC) 09/07/2022  . Lobar pneumonia (HCC) 09/07/2022  . ESRD (end stage renal disease) (HCC) 09/07/2022  . Sepsis due to undetermined organism (HCC) 05/30/2022  . Pneumoperitoneum 05/28/2022  . Duodenal  perforation (HCC) 05/28/2022  . Peritonitis (HCC) 05/28/2022  . Acute upper GI bleed 05/24/2022  . Multiple duodenal ulcers 05/24/2022  . ABLA (acute blood loss anemia) 05/24/2022  . Melena 05/22/2022  . HCAP (healthcare-associated pneumonia) 05/16/2022  . Hypothermia 05/16/2022  . Acute renal failure superimposed on stage 3b chronic kidney disease (HCC) 05/16/2022  . Elevated brain natriuretic peptide (BNP) level 05/16/2022  . Elevated troponin 05/16/2022  . Increased anion gap metabolic acidosis 05/16/2022  . Prolonged QT interval 05/16/2022  . Hypoalbuminemia due to protein-calorie malnutrition (HCC) 05/16/2022  . Iron  deficiency anemia 05/16/2022  . CKD (chronic kidney disease), stage V (HCC) 05/16/2022  . Renal mass 05/14/2022  . Simple renal cyst 05/14/2022  . Vitamin D deficiency 05/14/2022  . Chronic obstructive pulmonary disease with (acute) exacerbation (HCC) 05/07/2022  . Pneumonia due to infectious organism 05/07/2022  . Acute nontraumatic kidney injury (HCC) 05/07/2022  . Mixed hyperlipidemia 05/07/2022  . 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  . Primary hypertension 05/07/2022  . Rheumatoid arthritis involving both hands with negative rheumatoid factor (HCC) 05/07/2022  . Medication therapy management recommendation declined by patient 07/28/2020  . Encounter for monitoring aromatase inhibitor therapy 06/12/2020  . History of radiation therapy 06/12/2020  . Involvement of right lateral surgical margin by neoplasm 04/16/2020  . Status post breast lumpectomy 04/16/2020  . Coordination of complex care 01/23/2020  . Osteopenia of multiple sites 01/23/2020  . 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 Abnormal mammogram with microcalcification 01/08/2020  .  Abnormal mammogram of left breast 12/18/2019  . Gastro-esophageal reflux disease with esophagitis 02/22/2019  . Arthritis 11/24/2018  . Anemia 11/24/2018  . Elevated ferritin level  11/24/2018  . Thrombocytosis 11/24/2018   Home Medication(s) Prior to Admission medications   Medication Sig Start Date End Date Taking? Authorizing Provider  albuterol  (VENTOLIN  HFA) 108 (90 Base) MCG/ACT inhaler Inhale 2 puffs into the lungs every 6 (six) hours as needed for wheezing or shortness of breath. 09/28/23   Maree, Pratik D, DO  amLODipine  (NORVASC ) 10 MG tablet Take 10 mg by mouth daily.    [provider]  b complex-vitamin c-folic acid  (NEPHRO-VITE) 0.8 MG TABS tablet Take 1 tablet by mouth at bedtime.    [provider]  ferrous sulfate  325 (65 FE) MG EC tablet Take 325 mg by mouth 3 (three) times a week.    [provider]  hydrALAZINE  (APRESOLINE ) 50 MG tablet Take 1 tablet (50 mg total) by mouth 2 (two) times daily. 09/15/23   Evonnie Lenis, MD  losartan  (COZAAR ) 50 MG tablet Take 1 tablet (50 mg total) by mouth daily. 09/16/23   Evonnie Lenis, MD  metoprolol  succinate (TOPROL -XL) 50 MG 24 hr tablet Take 50 mg by mouth daily. 10/15/22   [provider]  pantoprazole  (PROTONIX ) 40 MG tablet Take 1 tablet (40 mg total) by mouth 2 (two) times daily. 06/05/22 09/08/23  Ricky Fines, MD  sevelamer  carbonate (RENVELA ) 800 MG tablet Take 2,400 mg by mouth 3 (three) times daily with meals.    [provider]                                                                                                                                    Past Surgical History Past Surgical History:  Procedure Laterality Date  . A/V SHUNTOGRAM N/A 05/02/2023   Procedure: A/V SHUNTOGRAM;  Surgeon: Pearline Norman RAMAN, MD;  [facility];  Service: Cardiovascular;  Laterality: N/A;  . AV FISTULA PLACEMENT Left 11/29/2022   Procedure: INSERTION OF LEFT UPPER  ARTERIOVENOUS (AV) GORE-TEX GRAFT ARM;  Surgeon: Lanis Fonda BRAVO, MD;  Location: [facility] OR;  Service: Vascular;  Laterality: Left;  . CENTRAL VENOUS CATHETER INSERTION Left 05/27/2022   Procedure: INSERTION  CENTRAL LINE ADULT;  Surgeon: Kallie Manuelita BROCKS, MD;  Location: [facility] ORS;  Service: General;  Laterality: Left;  . COLONOSCOPY WITH PROPOFOL  N/A 05/22/2022   Surgeon: Eartha Flavors, Toribio, MD; blood in the terminal ileum and colon.  . ESOPHAGOGASTRODUODENOSCOPY (EGD) WITH PROPOFOL  N/A 05/22/2022   Surgeon: Eartha Flavors, Toribio, MD; peptic ulcer disease with 2 nonbleeding cratered gastric ulcers, multiple duodenal ulcers, 2 of which had adherent clots that were removed and revealed 2 vessels.  1 vessel was clipped, cauterized, and treated with epinephrine  injection.  Second vessel was clipped and treated with cautery, clip fell off.  . IR FLUORO GUIDE CV LINE RIGHT  05/24/2022  .  IR US  GUIDE VASC ACCESS RIGHT  05/24/2022  . LAPAROTOMY N/A 05/27/2022   Procedure: EXPLORATORY LAPAROTOMY, OMENTAL PATH, DRAIN PLACEMENT;  Surgeon: Kallie Manuelita BROCKS, MD;  Location: [facility] ORS;  Service: General;  Laterality: N/A;  . PERIPHERAL VASCULAR BALLOON ANGIOPLASTY  05/02/2023   Procedure: PERIPHERAL VASCULAR BALLOON ANGIOPLASTY;  Surgeon: Pearline Norman RAMAN, MD;  Location: [facility] INVASIVE CV LAB;  Service: Cardiovascular;;  . PERIPHERAL VASCULAR THROMBECTOMY Left 08/18/2023   Procedure: PERIPHERAL VASCULAR THROMBECTOMY;  Surgeon: Melia Lynwood ORN, MD;  Location: [facility] INVASIVE CV LAB;  Service: Cardiovascular;  Laterality: Left;  SABRA VENOUS ANGIOPLASTY Left 08/18/2023   Procedure: VENOUS ANGIOPLASTY;  Surgeon: Melia Lynwood ORN, MD;  Location: [facility] INVASIVE CV LAB;  Service: Cardiovascular;  Laterality: Left;  80% Intragraft; 50% Venous Anastomosis  . VOLVULUS REDUCTION     colon resection   Family History History reviewed. No pertinent family history.  Social History Social History   Tobacco Use  . Smoking status: Former    Types: Cigarettes    Passive exposure: Past  . Smokeless tobacco: Never  Vaping Use  . Vaping status: Never Used  Substance Use Topics  . Alcohol use: Not Currently  . Drug use: Never    Allergies Patient has no known allergies.  Review of Systems Review of Systems  Respiratory:  Positive for cough, chest tightness and shortness of breath.     Physical Exam Vital Signs  I have reviewed the triage vital signs BP (!) 188/65 (BP Location: Right Arm)   Pulse 94   Temp 98.1 F (36.7 C) (Oral)   Resp (!) 26   Ht 4' 11 (1.499 m)   Wt 44 kg   SpO2 98%   BMI 19.59 kg/m   Physical Exam Vitals and nursing note reviewed.  Constitutional:      General: She is not in acute distress.    Appearance: She is well-developed.  HENT:     Head: Normocephalic and atraumatic.  Eyes:     Conjunctiva/sclera: Conjunctivae normal.  Cardiovascular:     Rate and Rhythm: Normal rate and regular rhythm.     Heart sounds: No murmur heard. Pulmonary:     Effort: Pulmonary effort is normal. No respiratory distress.     Breath sounds: Wheezing and rales present.  Abdominal:     Palpations: Abdomen is soft.     Tenderness: There is no abdominal tenderness.  Musculoskeletal:        General: No swelling.     Cervical back: Neck supple.  Skin:    General: Skin is warm and dry.     Capillary Refill: Capillary refill takes less than 2 seconds.  Neurological:     Mental Status: She is alert.  Psychiatric:        Mood and Affect: Mood normal.     ED Results and Treatments Labs (all labs ordered are listed, but only abnormal results are displayed) Labs Reviewed  COMPREHENSIVE METABOLIC PANEL WITH GFR - Abnormal; Notable for the following components:      Result Value   BUN 29 (*)    Creatinine, Ser 6.75 (*)    Albumin  3.1 (*)    AST 14 (*)    GFR, Estimated 6 (*)    All other components within normal limits  CBC WITH DIFFERENTIAL/PLATELET - Abnormal; Notable for the following components:   WBC 11.9 (*)    RBC 3.24 (*)    Hemoglobin 8.8 (*)    HCT 27.8 (*)  RDW 18.4 (*)    Neutro Abs 8.5 (*)    All other components within normal limits  BRAIN NATRIURETIC PEPTIDE -  Abnormal; Notable for the following components:   B Natriuretic Peptide 1,645.0 (*)    All other components within normal limits  TROPONIN I (HIGH SENSITIVITY) - Abnormal; Notable for the following components:   Troponin I (High Sensitivity) 90 (*)    All other components within normal limits                                                                                                                          Radiology DG Chest Portable 1 View Result Date: 12/03/2023 EXAM: 1 VIEW XRAY OF THE CHEST 12/03/2023 03:18:00 AM COMPARISON: 09/26/2023 CLINICAL HISTORY: Dyspnea. FINDINGS: LUNGS AND PLEURA: Moderate bilateral pleural effusions. Pulmonary vascular congestion. Interstitial edema. Bibasilar airspace opacities. No pneumothorax. HEART AND MEDIASTINUM: Stable cardiomegaly. Aortic atherosclerotic calcification. BONES AND SOFT TISSUES: No acute osseous abnormality. IMPRESSION: 1. Findings most suggestive of CHF with moderate bilateral pleural effusions similar to prior. Multifocal pneumonia could appear similarly. Electronically signed by: Norman Gatlin MD 12/03/2023 03:26 AM EDT RP Workstation: [id number]    Pertinent labs & imaging results that were available during my care of the patient were reviewed by me and considered in my medical decision making (see MDM for details).  Medications Ordered in ED Medications  ipratropium-albuterol  (DUONEB) 0.5-2.5 (3) MG/3ML nebulizer solution 9 mL (9 mLs Nebulization Given 12/03/23 0336)  methylPREDNISolone  sodium succinate (SOLU-MEDROL ) 125 mg/2 mL injection 125 mg (125 mg Intravenous Given 12/03/23 0325)                                                                                                                                     Procedures .Critical Care  Performed by: Albertina Dixon, MD Authorized by: Albertina Dixon, MD   Critical care provider statement:    Critical care time (minutes):  30   Critical care was necessary to treat or prevent  imminent or life-threatening deterioration of the following conditions:  Respiratory failure   Critical care was time spent personally by me on the following activities:  Development of treatment plan with patient or surrogate, discussions with consultants, evaluation of patient's response to treatment, examination of patient, ordering and review of laboratory studies, ordering and review of radiographic studies, ordering and performing treatments and interventions, pulse oximetry, re-evaluation of patient's  condition and review of old charts   (including critical care time)  Medical Decision Making / ED Course   This patient presents to the ED for concern of shortness of breath, this involves an extensive number of treatment options, and is a complaint that carries with it a high risk of complications and morbidity.  The differential diagnosis includes Pe, PTX, Pulmonary Edema, COPD/Asthma, ACS, CHF exacerbation, Arrhythmia,  Anemia, Sepsis, Anxiety, Viral UR  MDM: Patient seen emerged part for evaluation of shortness of breath.  Physical exam reveals a tachypneic patient with rales at the bases and wheezing bilaterally.  Mild accessory muscle use.  Laboratory evaluation with a leukocytosis to 11.9, hemoglobin 8.8, BUN 29, creatinine 6.75, high-sensitivity troponin is 90 likely demand ischemia, BNP elevated at 1645.  Patient requiring 2 L nasal cannula to maintain oxygen saturations.  Chest x-ray with bilateral pleural effusions and fluid overload.  Patient received 3 DuoNebs and methylprednisolone  and on reevaluation she states she feels like her breathing is improved and repeat pulmonary exam does show improvement of the wheezing.  However she is still tachypneic breathing about 30 times a minute on 2 L nasal cannula and I have concerns about a safe discharge plan for the patient.  She will require dialysis this morning for new oxygen requirement and fluid overload.  I sent a message to the  nephrologist on-call Dr. Macel.  patient will require hospital admission at this time.   Additional history obtained:  -External records from outside source obtained and reviewed including: Chart review including previous notes, labs, imaging, consultation notes   Lab Tests: -I ordered, reviewed, and interpreted labs.   The pertinent results include:   Labs Reviewed  COMPREHENSIVE METABOLIC PANEL WITH GFR - Abnormal; Notable for the following components:      Result Value   BUN 29 (*)    Creatinine, Ser 6.75 (*)    Albumin  3.1 (*)    AST 14 (*)    GFR, Estimated 6 (*)    All other components within normal limits  CBC WITH DIFFERENTIAL/PLATELET - Abnormal; Notable for the following components:   WBC 11.9 (*)    RBC 3.24 (*)    Hemoglobin 8.8 (*)    HCT 27.8 (*)    RDW 18.4 (*)    Neutro Abs 8.5 (*)    All other components within normal limits  BRAIN NATRIURETIC PEPTIDE - Abnormal; Notable for the following components:   B Natriuretic Peptide 1,645.0 (*)    All other components within normal limits  TROPONIN I (HIGH SENSITIVITY) - Abnormal; Notable for the following components:   Troponin I (High Sensitivity) 90 (*)    All other components within normal limits         Imaging Studies ordered: I ordered imaging studies including chest x-ray I independently visualized and interpreted imaging. I agree with the radiologist interpretation   Medicines ordered and prescription drug management: Meds ordered this encounter  Medications  . ipratropium-albuterol  (DUONEB) 0.5-2.5 (3) MG/3ML nebulizer solution 9 mL  . methylPREDNISolone  sodium succinate (SOLU-MEDROL ) 125 mg/2 mL injection 125 mg    -I have reviewed the patients home medicines and have made adjustments as needed  Critical interventions Multiple DuoNebs, steroids, supplemental oxygen   Cardiac Monitoring: The patient was maintained on a cardiac monitor.  I personally viewed and interpreted the cardiac  monitored which showed an underlying rhythm of: NSR  Social Determinants of Health:  Factors impacting patients care include: none   Reevaluation: After the  interventions noted above, I reevaluated the patient and found that they have :improved  Co morbidities that complicate the patient evaluation . Past Medical History:  Diagnosis Date  . Anemia   . Chronic kidney disease (CKD), stage IV (severe) (HCC)   . COPD (chronic obstructive pulmonary disease) (HCC)   . ESRD (end stage renal disease) (HCC)    Hemodialysis T, TH, SAT, [address] , [place]  . GERD (gastroesophageal reflux disease)   . History of blood transfusion   . History of blood transfusion   . Hypertension   . Pneumonia    3 times in 2024-  . PUD (peptic ulcer disease) 05/2022  . Rheumatoid arthritis (HCC)   . Rheumatoid arthritis (HCC)       Dispostion: I considered admission for this patient, and patient require hospital admission for hypoxia, fluid overload and concomitant COPD exacerbation     Final Clinical Impression(s) / ED Diagnoses Final diagnoses:  None     @PCDICTATION @    Albertina Dixon, MD 12/03/23 [phone]

## 2023-12-03 NOTE — Assessment & Plan Note (Signed)
 No signs of exacerbation, continue with bronchodilator therapy.

## 2023-12-03 NOTE — Assessment & Plan Note (Signed)
 Uncontrolled hypertension, due to volume overload.   Plan to continue amlodipine , hydralazine , metoprolol  and losartan ,  For ultrafiltration today with hemodialysis.

## 2023-12-03 NOTE — H&P (Addendum)
 History and Physical    Patient: Sheri Cooper FMW:[id number] DOB: 02-Apr-1951 DOA: 12/03/2023 DOS: the patient was seen and examined on 12/03/2023 PCP: Toribio Jerel MATSU, MD  Patient coming from: Home  Chief Complaint:  Chief Complaint  Patient presents with   Shortness of Breath   HPI: Sheri Cooper is a 73 y.o. 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   Recent hospitalization 05/26 to 09/28/23 for volume overload, she had inpatient renal replacement therapy with ultrafiltration with improvement in her symptoms.   This week on Tuesday patient had hemodialysis but not ultrafiltration due to being at target dry weight (even lower per patient report), on Thursday her treatment was cut one hour short due to malfunctioning dialysis machine.  The following day, Friday, during the afternoon she developed acute and rapidly worsening dyspnea, worse with movement, with no improving factors and associated with cough.   Denies recent weight loss, no chest pain, no nausea or vomiting.    Review of Systems: As mentioned in the history of present illness. All other systems reviewed and are negative. Past Medical History:  Diagnosis Date   Anemia    Chronic kidney disease (CKD), stage IV (severe) (HCC)    COPD (chronic obstructive pulmonary disease) (HCC)    ESRD (end stage renal disease) (HCC)    Hemodialysis T, TH, SAT, [address] , [place]   GERD (gastroesophageal reflux disease)    History of blood transfusion    History of blood transfusion    Hypertension    Pneumonia    3 times in 2024-   PUD (peptic ulcer disease) 05/2022   Rheumatoid arthritis (HCC)    Rheumatoid arthritis (HCC)    Past Surgical History:  Procedure Laterality Date   A/V SHUNTOGRAM N/A 05/02/2023   Procedure: A/V SHUNTOGRAM;  Surgeon: Pearline Norman GORMAN, MD;  Location: [facility]  INVASIVE CV LAB;  Service: Cardiovascular;  Laterality: N/A;   AV FISTULA PLACEMENT Left 11/29/2022   Procedure: INSERTION OF LEFT UPPER  ARTERIOVENOUS (AV) GORE-TEX GRAFT ARM;  Surgeon: Lanis Fonda BRAVO, MD;  Location[facility] OR;  Service: Vascular;  Laterality: Left;   CENTRAL VENOUS CATHETER INSERTION Left 05/27/2022   Procedure: INSERTION CENTRAL LINE ADULT;  Surgeon: Kallie Manuelita BROCKS, MD;  Location: [facility] ORS;  Service: General;  Laterality: Left;   COLONOSCOPY WITH PROPOFOL  N/A 05/22/2022   Surgeon: Eartha Angelia Toribio, MD; blood in the terminal ileum and colon.   ESOPHAGOGASTRODUODENOSCOPY (EGD) WITH PROPOFOL  N/A 05/22/2022   Surgeon: Eartha Angelia, Toribio, MD; peptic ulcer disease with 2 nonbleeding cratered gastric ulcers, multiple duodenal ulcers, 2 of which had adherent clots that were removed and revealed 2 vessels.  1 vessel was clipped, cauterized, and treated with epinephrine  injection.  Second vessel was clipped and treated with cautery, clip fell off.   IR FLUORO GUIDE CV LINE RIGHT  05/24/2022   IR US  GUIDE VASC ACCESS RIGHT  05/24/2022   LAPAROTOMY N/A 05/27/2022   Procedure: EXPLORATORY LAPAROTOMY, OMENTAL PATH, DRAIN PLACEMENT;  Surgeon: Kallie Manuelita BROCKS, MD;  Location: [facility] ORS;  Service: General;  Laterality: N/A;   PERIPHERAL VASCULAR BALLOON ANGIOPLASTY  05/02/2023   Procedure: PERIPHERAL VASCULAR BALLOON ANGIOPLASTY;  Surgeon: Pearline Norman GORMAN, MD;  Location: [facility] INVASIVE CV LAB;  Service: Cardiovascular;;   PERIPHERAL VASCULAR THROMBECTOMY Left 08/18/2023   Procedure: PERIPHERAL VASCULAR THROMBECTOMY;  Surgeon: Melia Lynwood ORN, MD;  Location:  [facility] INVASIVE CV LAB;  Service: Cardiovascular;  Laterality: Left;   VENOUS ANGIOPLASTY Left 08/18/2023   Procedure: VENOUS ANGIOPLASTY;  Surgeon: Melia Lynwood ORN, MD;  Location: [facility] INVASIVE CV LAB;  Service: Cardiovascular;  Laterality: Left;  80% Intragraft; 50% Venous Anastomosis   VOLVULUS REDUCTION     colon resection   Social  History:  reports that she has quit smoking. Her smoking use included cigarettes. She has been exposed to tobacco smoke. She has never used smokeless tobacco. She reports that she does not currently use alcohol. She reports that she does not use drugs.  No Known Allergies  History reviewed. No pertinent family history.  Prior to Admission medications   Medication Sig Start Date End Date Taking? Authorizing Provider  albuterol  (VENTOLIN  HFA) 108 (90 Base) MCG/ACT inhaler Inhale 2 puffs into the lungs every 6 (six) hours as needed for wheezing or shortness of breath. 09/28/23   Maree, Pratik D, DO  amLODipine  (NORVASC ) 10 MG tablet Take 10 mg by mouth daily.    [provider]  b complex-vitamin c-folic acid  (NEPHRO-VITE) 0.8 MG TABS tablet Take 1 tablet by mouth at bedtime.    [provider]  ferrous sulfate  325 (65 FE) MG EC tablet Take 325 mg by mouth 3 (three) times a week.    [provider]  hydrALAZINE  (APRESOLINE ) 50 MG tablet Take 1 tablet (50 mg total) by mouth 2 (two) times daily. 09/15/23   Evonnie Lenis, MD  losartan  (COZAAR ) 50 MG tablet Take 1 tablet (50 mg total) by mouth daily. 09/16/23   Evonnie Lenis, MD  metoprolol  succinate (TOPROL -XL) 50 MG 24 hr tablet Take 50 mg by mouth daily. 10/15/22   [provider]  pantoprazole  (PROTONIX ) 40 MG tablet Take 1 tablet (40 mg total) by mouth 2 (two) times daily. 06/05/22 09/08/23  Ricky Fines, MD  sevelamer  carbonate (RENVELA ) 800 MG tablet Take 2,400 mg by mouth 3 (three) times daily with meals.    [provider]    Physical Exam: Vitals:   12/03/23 0315 12/03/23 0330 12/03/23 0337 12/03/23 0400  BP:  (!) 171/135  (!) 203/136  Pulse: 94 76  100  Resp: (!) 26 (!) 23  (!) 23  Temp:      TempSrc:      SpO2: 98% 98% 98% 96%  Weight:      Height:       BP (!) 203/136 (BP Location: Right Arm)   Pulse 100   Temp 98.1 F (36.7 C) (Oral)   Resp (!) 23   Ht 4' 11 (1.499 m)   Wt 44 kg   SpO2 96%    BMI 19.59 kg/m   Neurology awake and alert ENT with mild pallor Cardiovascular with S1 and S2 present and regular with no gallops or rubs, no murmurs, positive extra beats Positive JVD Respiratory with rales at bases with no wheezing or rhonchi  Abdomen with no distention  No lower extremity edema  HD access left  upper extremity graft  Data Reviewed:   Na 143, K 4.1 Cl 104 bicarbonate 24 glucose 87 bun 29 cr 6.75  AST 14 ALT 6  BNP 1,645  High sensitive troponin 90  Wbc 11,9 hgb 8,8 plt 356   Chest radiograph with positive cardiomegaly, bilateral hilar vascular congestion, bilateral pleural effusions, left more than right.  Assessment and Plan: * ESRD on hemodialysis (HCC) Positive volume overload.   Patient with no acidosis, uremic symptoms or hyperkalemia.  02 requirements  at 2 L/min per [place] to keep 02 saturation above 92% Patient still makes urine   Plan for renal replacement therapy with ultrafiltration in the hospital today.  Add furosemide  80 mg IV once Will follow up with nephrology recommendations.   Anemia of chronic renal disease, with iron  deficiency.  Continue iron  supplementation   Metabolic bone disease, continue with sevelamer ,   Essential hypertension Uncontrolled hypertension, due to volume overload.   Plan to continue amlodipine , hydralazine , metoprolol  and losartan ,  For ultrafiltration today with hemodialysis.   COPD (chronic obstructive pulmonary disease) (HCC) No signs of exacerbation, continue with bronchodilator therapy   Rheumatoid arthritis involving both hands with negative rheumatoid factor (HCC) No signs of acute flare.   Peptic ulcer disease Continue pantoprazole  40 mg po bid.    Advance Care Planning:   Code Status: Full Code   Consults: nephrology in am  Family Communication: no family at the bedside   Severity of Illness: The appropriate patient status for this patient is OBSERVATION. Observation status is judged to be  reasonable and necessary in order to provide the required intensity of service to ensure the patient's safety. The patient's presenting symptoms, physical exam findings, and initial radiographic and laboratory data in the context of their medical condition is felt to place them at decreased risk for further clinical deterioration. Furthermore, it is anticipated that the patient will be medically stable for discharge from the hospital within 2 midnights of admission.   Author: Elidia Toribio Furnace, MD 12/03/2023 4:11 AM  For on call review [email or web address].

## 2023-12-03 NOTE — Assessment & Plan Note (Signed)
 Continue pantoprazole 40 mg p.o. b.i.d.

## 2023-12-03 NOTE — Progress Notes (Signed)
 During the patient's dialysis treatment , symptoms such us  cramps, hypotension, and syncope occurred, so the target UF 3 L was not achieved, with 1.6 liters of fluid removed. The nephrologist Dr. Macel has already reported the case. The patient is already at her dry weight. Currently , the patient is alert and in stable condition with steady vital signs.  12/03/23 1430  Vitals  Temp 98.2 F (36.8 C)  Temp Source Oral  BP (!) 150/70  BP Location Right Arm  BP Method Automatic  Patient Position (if appropriate) Lying  Pulse Rate 88  ECG Heart Rate 90  Resp 20  Oxygen Therapy  SpO2 100 %  O2 Device Nasal Cannula  O2 Flow Rate (L/min) 2 L/min  During Treatment Monitoring  Intra-Hemodialysis Comments Tx completed  Post Treatment  Dialyzer Clearance Lightly streaked  Hemodialysis Intake (mL) 0 mL  Liters Processed 68  Fluid Removed (mL) 1600 mL  Tolerated HD Treatment Yes  Post-Hemodialysis Comments see notes  AVG/AVF Arterial Site Held (minutes) 10 minutes  AVG/AVF Venous Site Held (minutes) 10 minutes  Fistula / Graft Left Upper arm Arteriovenous vein graft  Placement Date/Time: 11/29/22 1428   Orientation: Left  Access Location: Upper arm  Access Type: Arteriovenous vein graft  Site Condition No complications  Fistula / Graft Assessment Present;Thrill;Bruit  Status Deaccessed  Needle Size 15  Drainage Description None

## 2023-12-03 NOTE — TOC CM/SW Note (Signed)
 Transition of Care [facility]) - Inpatient Brief Assessment   Patient Details  Name: Sheri Cooper MRN: [id number] Date of Birth: 11-Jul-1950  Transition of Care [facility]) CM/SW Contact:    Noreen KATHEE Pinal, LCSWA Phone Number: 12/03/2023, 8:16 AM   Clinical Narrative:  Transition of Care Department [facility]) has reviewed patient and no TOC needs have been identified at this time. We will continue to monitor patient advancement through interdisciplinary progression rounds. If new patient transition needs arise, please place a TOC consult.  Transition of Care Asessment: Insurance and Status: Insurance coverage has been reviewed Patient has primary care physician: Yes Home environment has been reviewed: Apartment Prior level of function:: Independent Prior/Current Home Services: No current home services Social Drivers of Health Review: SDOH reviewed no interventions necessary Readmission risk has been reviewed: Yes Transition of care needs: no transition of care needs at this time

## 2023-12-04 LAB — HEPATITIS B SURFACE ANTIBODY, QUANTITATIVE: Hep B S AB Quant (Post): <3.5 m[IU]/mL — ABNORMAL LOW

## 2023-12-05 ENCOUNTER — Other Ambulatory Visit: Payer: Self-pay

## 2023-12-05 ENCOUNTER — Encounter (HOSPITAL_COMMUNITY): Payer: Self-pay | Admitting: Emergency Medicine

## 2023-12-05 ENCOUNTER — Emergency Department (HOSPITAL_COMMUNITY)

## 2023-12-05 ENCOUNTER — Inpatient Hospital Stay (HOSPITAL_COMMUNITY)
Admission: EM | Admit: 2023-12-05 | Discharge: 2023-12-08 | DRG: 291 | Disposition: A | Discharge status: Home / Self Care | Attending: Internal Medicine | Admitting: Internal Medicine

## 2023-12-05 DIAGNOSIS — R0602 Shortness of breath: Secondary | ICD-10-CM | POA: Diagnosis not present

## 2023-12-05 DIAGNOSIS — D631 Anemia in chronic kidney disease: Secondary | ICD-10-CM | POA: Diagnosis not present

## 2023-12-05 DIAGNOSIS — R0902 Hypoxemia: Secondary | ICD-10-CM | POA: Diagnosis present

## 2023-12-05 DIAGNOSIS — M898X9 Other specified disorders of bone, unspecified site: Secondary | ICD-10-CM | POA: Diagnosis not present

## 2023-12-05 DIAGNOSIS — K219 Gastro-esophageal reflux disease without esophagitis: Secondary | ICD-10-CM | POA: Diagnosis present

## 2023-12-05 DIAGNOSIS — K521 Toxic gastroenteritis and colitis: Secondary | ICD-10-CM | POA: Diagnosis not present

## 2023-12-05 DIAGNOSIS — I5033 Acute on chronic diastolic (congestive) heart failure: Secondary | ICD-10-CM | POA: Diagnosis present

## 2023-12-05 DIAGNOSIS — E8779 Other fluid overload: Secondary | ICD-10-CM | POA: Diagnosis not present

## 2023-12-05 DIAGNOSIS — Z87891 Personal history of nicotine dependence: Secondary | ICD-10-CM | POA: Diagnosis not present

## 2023-12-05 DIAGNOSIS — T3695XA Adverse effect of unspecified systemic antibiotic, initial encounter: Secondary | ICD-10-CM | POA: Diagnosis not present

## 2023-12-05 DIAGNOSIS — I132 Hypertensive heart and chronic kidney disease with heart failure and with stage 5 chronic kidney disease, or end stage renal disease: Secondary | ICD-10-CM | POA: Diagnosis not present

## 2023-12-05 DIAGNOSIS — Z8711 Personal history of peptic ulcer disease: Secondary | ICD-10-CM | POA: Diagnosis not present

## 2023-12-05 DIAGNOSIS — J90 Pleural effusion, not elsewhere classified: Secondary | ICD-10-CM | POA: Diagnosis not present

## 2023-12-05 DIAGNOSIS — Z79899 Other long term (current) drug therapy: Secondary | ICD-10-CM

## 2023-12-05 DIAGNOSIS — R9431 Abnormal electrocardiogram [ECG] [EKG]: Secondary | ICD-10-CM | POA: Diagnosis not present

## 2023-12-05 DIAGNOSIS — N2581 Secondary hyperparathyroidism of renal origin: Secondary | ICD-10-CM | POA: Diagnosis present

## 2023-12-05 DIAGNOSIS — M069 Rheumatoid arthritis, unspecified: Secondary | ICD-10-CM | POA: Diagnosis present

## 2023-12-05 DIAGNOSIS — J441 Chronic obstructive pulmonary disease with (acute) exacerbation: Secondary | ICD-10-CM | POA: Diagnosis not present

## 2023-12-05 DIAGNOSIS — K279 Peptic ulcer, site unspecified, unspecified as acute or chronic, without hemorrhage or perforation: Secondary | ICD-10-CM | POA: Diagnosis present

## 2023-12-05 DIAGNOSIS — Z992 Dependence on renal dialysis: Secondary | ICD-10-CM | POA: Diagnosis not present

## 2023-12-05 DIAGNOSIS — N186 End stage renal disease: Secondary | ICD-10-CM | POA: Diagnosis present

## 2023-12-05 DIAGNOSIS — I10 Essential (primary) hypertension: Secondary | ICD-10-CM | POA: Diagnosis present

## 2023-12-05 DIAGNOSIS — R918 Other nonspecific abnormal finding of lung field: Secondary | ICD-10-CM | POA: Diagnosis not present

## 2023-12-05 DIAGNOSIS — I499 Cardiac arrhythmia, unspecified: Secondary | ICD-10-CM | POA: Diagnosis not present

## 2023-12-05 DIAGNOSIS — I517 Cardiomegaly: Secondary | ICD-10-CM | POA: Diagnosis not present

## 2023-12-05 DIAGNOSIS — E8770 Fluid overload, unspecified: Secondary | ICD-10-CM | POA: Diagnosis not present

## 2023-12-05 DIAGNOSIS — N051 Unspecified nephritic syndrome with focal and segmental glomerular lesions: Secondary | ICD-10-CM | POA: Diagnosis not present

## 2023-12-05 LAB — CBC
HCT: 27 % — ABNORMAL LOW (ref 36.0–46.0)
Hemoglobin: 8.5 g/dL — ABNORMAL LOW (ref 12.0–15.0)
MCH: 27.6 pg (ref 26.0–34.0)
MCHC: 31.5 g/dL (ref 30.0–36.0)
MCV: 87.7 fL (ref 80.0–100.0)
Platelets: 420 K/uL — ABNORMAL HIGH (ref 150–400)
RBC: 3.08 MIL/uL — ABNORMAL LOW (ref 3.87–5.11)
RDW: 19.5 % — ABNORMAL HIGH (ref 11.5–15.5)
WBC: 14.3 K/uL — ABNORMAL HIGH (ref 4.0–10.5)
nRBC: 0 % (ref 0.0–0.2)

## 2023-12-05 LAB — COMPREHENSIVE METABOLIC PANEL WITH GFR
ALT: 8 U/L (ref 0–44)
AST: 17 U/L (ref 15–41)
Albumin: 3.1 g/dL — ABNORMAL LOW (ref 3.5–5.0)
Alkaline Phosphatase: 95 U/L (ref 38–126)
Anion gap: 14 (ref 5–15)
BUN: 56 mg/dL — ABNORMAL HIGH (ref 8–23)
CO2: 21 mmol/L — ABNORMAL LOW (ref 22–32)
Calcium: 8.6 mg/dL — ABNORMAL LOW (ref 8.9–10.3)
Chloride: 106 mmol/L (ref 98–111)
Creatinine, Ser: 6.85 mg/dL — ABNORMAL HIGH (ref 0.44–1.00)
GFR, Estimated: 6 mL/min — ABNORMAL LOW (ref >60)
Glucose, Bld: 111 mg/dL — ABNORMAL HIGH (ref 70–99)
Potassium: 4.1 mmol/L (ref 3.5–5.1)
Sodium: 141 mmol/L (ref 135–145)
Total Bilirubin: 0.7 mg/dL (ref 0.0–1.2)
Total Protein: 6.7 g/dL (ref 6.5–8.1)

## 2023-12-05 LAB — BRAIN NATRIURETIC PEPTIDE: B Natriuretic Peptide: 802 pg/mL — ABNORMAL HIGH (ref 0.0–100.0)

## 2023-12-05 LAB — RESP PANEL BY RT-PCR (RSV, FLU A&B, COVID)  RVPGX2
Influenza A by PCR: NEGATIVE
Influenza B by PCR: NEGATIVE
Resp Syncytial Virus by PCR: NEGATIVE
SARS Coronavirus 2 by RT PCR: NEGATIVE

## 2023-12-05 LAB — MAGNESIUM: Magnesium: 2.5 mg/dL — ABNORMAL HIGH (ref 1.7–2.4)

## 2023-12-05 MED ORDER — GUAIFENESIN-DM 100-10 MG/5ML PO SYRP
15.0000 mL | ORAL_SOLUTION | Freq: Three times a day (TID) | ORAL | Status: AC
  Administered 2023-12-05 – 2023-12-06 (×2): 15 mL via ORAL
  Filled 2023-12-05 (×2): qty 15

## 2023-12-05 MED ORDER — HYDRALAZINE HCL 25 MG PO TABS
25.0000 mg | ORAL_TABLET | ORAL | Status: AC
  Administered 2023-12-05: 25 mg via ORAL
  Filled 2023-12-05: qty 1

## 2023-12-05 MED ORDER — PREDNISONE 20 MG PO TABS
40.0000 mg | ORAL_TABLET | Freq: Every day | ORAL | Status: DC
  Administered 2023-12-07 – 2023-12-08 (×2): 40 mg via ORAL
  Filled 2023-12-05 (×2): qty 2

## 2023-12-05 MED ORDER — METHYLPREDNISOLONE SODIUM SUCC 125 MG IJ SOLR
125.0000 mg | Freq: Once | INTRAMUSCULAR | Status: AC
  Administered 2023-12-05: 125 mg via INTRAVENOUS
  Filled 2023-12-05: qty 2

## 2023-12-05 MED ORDER — SEVELAMER CARBONATE 800 MG PO TABS
2400.0000 mg | ORAL_TABLET | Freq: Three times a day (TID) | ORAL | Status: DC
  Administered 2023-12-06 – 2023-12-08 (×7): 2400 mg via ORAL
  Filled 2023-12-05 (×7): qty 3

## 2023-12-05 MED ORDER — METHYLPREDNISOLONE SODIUM SUCC 125 MG IJ SOLR
60.0000 mg | Freq: Two times a day (BID) | INTRAMUSCULAR | Status: AC
  Administered 2023-12-06 (×2): 60 mg via INTRAVENOUS
  Filled 2023-12-05 (×2): qty 2

## 2023-12-05 MED ORDER — AMLODIPINE BESYLATE 5 MG PO TABS
10.0000 mg | ORAL_TABLET | Freq: Every day | ORAL | Status: DC
  Administered 2023-12-07 – 2023-12-08 (×2): 10 mg via ORAL
  Filled 2023-12-05 (×2): qty 2

## 2023-12-05 MED ORDER — LOSARTAN POTASSIUM 50 MG PO TABS
50.0000 mg | ORAL_TABLET | Freq: Every day | ORAL | Status: DC
  Administered 2023-12-07: 50 mg via ORAL
  Filled 2023-12-05: qty 1

## 2023-12-05 MED ORDER — LABETALOL HCL 5 MG/ML IV SOLN
10.0000 mg | INTRAVENOUS | Status: DC | PRN
  Administered 2023-12-06: 10 mg via INTRAVENOUS
  Filled 2023-12-05: qty 4

## 2023-12-05 MED ORDER — HEPARIN SODIUM (PORCINE) 5000 UNIT/ML IJ SOLN
5000.0000 [IU] | Freq: Three times a day (TID) | INTRAMUSCULAR | Status: DC
  Administered 2023-12-06 – 2023-12-08 (×7): 5000 [IU] via SUBCUTANEOUS
  Filled 2023-12-05 (×7): qty 1

## 2023-12-05 MED ORDER — IPRATROPIUM-ALBUTEROL 0.5-2.5 (3) MG/3ML IN SOLN: 3.0000 mL | RESPIRATORY_TRACT | Status: DC | PRN

## 2023-12-05 MED ORDER — ACETAMINOPHEN 650 MG RE SUPP: 650.0000 mg | Freq: Four times a day (QID) | RECTAL | Status: DC | PRN

## 2023-12-05 MED ORDER — HYDRALAZINE HCL 50 MG PO TABS
50.0000 mg | ORAL_TABLET | Freq: Two times a day (BID) | ORAL | Status: DC
  Administered 2023-12-06 – 2023-12-07 (×2): 50 mg via ORAL
  Filled 2023-12-05 (×3): qty 1

## 2023-12-05 MED ORDER — IPRATROPIUM-ALBUTEROL 0.5-2.5 (3) MG/3ML IN SOLN
3.0000 mL | Freq: Three times a day (TID) | RESPIRATORY_TRACT | Status: AC
  Administered 2023-12-06 (×2): 3 mL via RESPIRATORY_TRACT
  Filled 2023-12-05 (×2): qty 3

## 2023-12-05 MED ORDER — HYDRALAZINE HCL 25 MG PO TABS
25.0000 mg | ORAL_TABLET | Freq: Once | ORAL | Status: DC
Start: 2023-12-05 — End: 2023-12-05
  Administered 2023-12-05: 25 mg via ORAL

## 2023-12-05 MED ORDER — SODIUM CHLORIDE 0.9 % IV SOLN
100.0000 mg | Freq: Two times a day (BID) | INTRAVENOUS | Status: DC
  Administered 2023-12-06 – 2023-12-08 (×6): 100 mg via INTRAVENOUS
  Filled 2023-12-05 (×9): qty 100

## 2023-12-05 MED ORDER — METOPROLOL SUCCINATE ER 50 MG PO TB24
50.0000 mg | ORAL_TABLET | Freq: Every day | ORAL | Status: DC
  Administered 2023-12-07 – 2023-12-08 (×2): 50 mg via ORAL
  Filled 2023-12-05 (×2): qty 1

## 2023-12-05 MED ORDER — ALBUTEROL SULFATE (2.5 MG/3ML) 0.083% IN NEBU
5.0000 mg | INHALATION_SOLUTION | Freq: Once | RESPIRATORY_TRACT | Status: AC
  Administered 2023-12-05: 5 mg via RESPIRATORY_TRACT
  Filled 2023-12-05: qty 6

## 2023-12-05 MED ORDER — IPRATROPIUM-ALBUTEROL 0.5-2.5 (3) MG/3ML IN SOLN
RESPIRATORY_TRACT | Status: AC
Start: 2023-12-05 — End: 2023-12-06
  Filled 2023-12-05: qty 6

## 2023-12-05 MED ORDER — FUROSEMIDE 10 MG/ML IJ SOLN
80.0000 mg | Freq: Once | INTRAMUSCULAR | Status: AC
  Administered 2023-12-05: 80 mg via INTRAVENOUS
  Filled 2023-12-05: qty 8

## 2023-12-05 MED ORDER — IPRATROPIUM-ALBUTEROL 0.5-2.5 (3) MG/3ML IN SOLN
9.0000 mL | Freq: Once | RESPIRATORY_TRACT | Status: AC
  Administered 2023-12-05: 9 mL via RESPIRATORY_TRACT
  Filled 2023-12-05: qty 3

## 2023-12-05 MED ORDER — POLYETHYLENE GLYCOL 3350 17 G PO PACK: 17.0000 g | PACK | Freq: Every day | ORAL | Status: DC | PRN

## 2023-12-05 MED ORDER — ACETAMINOPHEN 325 MG PO TABS
650.0000 mg | ORAL_TABLET | Freq: Four times a day (QID) | ORAL | Status: DC | PRN
  Administered 2023-12-06: 650 mg via ORAL
  Filled 2023-12-05 (×2): qty 2

## 2023-12-05 NOTE — Assessment & Plan Note (Signed)
 Decompensated CHF, ESRD status.  Has not missed any HD sessions.  Last HD was here and in the hospital 8/2.  Recent echo 09/2023 EF of 60 to 65%.  Facial swelling otherwise no peripheral signs of edema.  Chest x-rays with finding of moderate pleural effusions, pulmonary edema.  BNP 802, was up to 1645 during recent hospitalization. -Per HD -IV Lasix  80 mg x 1

## 2023-12-05 NOTE — ED Notes (Signed)
 Pt walked to the bathroom once she was finished pt became out of breath. This tech got pt a wheelchair and pt is back in bed with call light in reach. Pt O2 at 98 at this time.

## 2023-12-05 NOTE — H&P (Signed)
 History and Physical    Sheri Cooper FMW:[id number] DOB: July 12, 1950 DOA: 12/05/2023  PCP: Toribio Jerel MATSU, MD   Patient coming from: Home  I have personally briefly reviewed patient's old medical records in [facility]  Chief Complaint: Difficulty breathing  HPI: Sheri Cooper is a 73 y.o. female with medical history significant for ESRD on dialysis-Tuesday Thursday Saturday, hypertension, COPD, GI bleed. Patient presented to the ED with complaints of difficulty breathing and productive cough that started this morning.  She has not missed any dialysis sessions.  She makes a little amount of urine.  No chest pain.  Lower extremity swelling.  No abdominal bloating.  She reports facial swelling -typically occurs when she is volume overloaded.   Patient was hospitalized and discharged same day 8/2, for volume overload secondary to ESRD, she was dialyzed and discharged home.  ED Course: Temperature 97.7.  Heart rate 79-107.  Respiratory rate 15-33.  Blood pressure elevated systolic up to 210.  O2 sats on my evaluation remained at 97% on room air.  She was placed on 2 L nasal cannula for comfort. WBC 14.3.  BNP 802.  BUN of 56.  Potassium 4.1.  Review of Systems: As per HPI all other systems reviewed and negative.  Past Medical History:  Diagnosis Date   Anemia    Chronic kidney disease (CKD), stage IV (severe) (HCC)    COPD (chronic obstructive pulmonary disease) (HCC)    ESRD (end stage renal disease) (HCC)    Hemodialysis T, TH, SAT, [address] , [place]   GERD (gastroesophageal reflux disease)    History of blood transfusion    History of blood transfusion    Hypertension    Pneumonia    3 times in 2024-   PUD (peptic ulcer disease) 05/2022   Rheumatoid arthritis (HCC)    Rheumatoid arthritis (HCC)     Past Surgical History:  Procedure Laterality Date   A/V SHUNTOGRAM N/A 05/02/2023   Procedure: A/V SHUNTOGRAM;  Surgeon: Pearline Norman RAMAN, MD;  Location: [facility]  INVASIVE CV LAB;  Service: Cardiovascular;  Laterality: N/A;   AV FISTULA PLACEMENT Left 11/29/2022   Procedure: INSERTION OF LEFT UPPER  ARTERIOVENOUS (AV) GORE-TEX GRAFT ARM;  Surgeon: Lanis Fonda BRAVO, MD;  Location: [facility] OR;  Service: Vascular;  Laterality: Left;   CENTRAL VENOUS CATHETER INSERTION Left 05/27/2022   Procedure: INSERTION CENTRAL LINE ADULT;  Surgeon: Kallie Manuelita BROCKS, MD;  Location: [facility] ORS;  Service: General;  Laterality: Left;   COLONOSCOPY WITH PROPOFOL  N/A 05/22/2022   Surgeon: Eartha Angelia Toribio, MD; blood in the terminal ileum and colon.   ESOPHAGOGASTRODUODENOSCOPY (EGD) WITH PROPOFOL  N/A 05/22/2022   Surgeon: Eartha Angelia, Toribio, MD; peptic ulcer disease with 2 nonbleeding cratered gastric ulcers, multiple duodenal ulcers, 2 of which had adherent clots that were removed and revealed 2 vessels.  1 vessel was clipped, cauterized, and treated with epinephrine  injection.  Second vessel was clipped and treated with cautery, clip fell off.   IR FLUORO GUIDE CV LINE RIGHT  05/24/2022   IR US  GUIDE VASC ACCESS RIGHT  05/24/2022   LAPAROTOMY N/A 05/27/2022   Procedure: EXPLORATORY LAPAROTOMY, OMENTAL PATH, DRAIN PLACEMENT;  Surgeon: Kallie Manuelita BROCKS, MD;  Location: [facility] ORS;  Service: General;  Laterality: N/A;   PERIPHERAL VASCULAR BALLOON ANGIOPLASTY  05/02/2023   Procedure: PERIPHERAL VASCULAR BALLOON ANGIOPLASTY;  Surgeon: Pearline Norman RAMAN, MD;  Location: [facility] INVASIVE CV LAB;  Service: Cardiovascular;;   PERIPHERAL VASCULAR THROMBECTOMY Left 08/18/2023  Procedure: PERIPHERAL VASCULAR THROMBECTOMY;  Surgeon: Melia Lynwood ORN, MD;  Location: [facility] INVASIVE CV LAB;  Service: Cardiovascular;  Laterality: Left;   VENOUS ANGIOPLASTY Left 08/18/2023   Procedure: VENOUS ANGIOPLASTY;  Surgeon: Melia Lynwood ORN, MD;  Location: [facility] INVASIVE CV LAB;  Service: Cardiovascular;  Laterality: Left;  80% Intragraft; 50% Venous Anastomosis   VOLVULUS REDUCTION     colon resection     reports  that she has quit smoking. Her smoking use included cigarettes. She has been exposed to tobacco smoke. She has never used smokeless tobacco. She reports that she does not currently use alcohol. She reports that she does not use drugs.  No Known Allergies  History reviewed. No pertinent family history.  Prior to Admission medications   Medication Sig Start Date End Date Taking? Authorizing Provider  albuterol  (VENTOLIN  HFA) 108 (90 Base) MCG/ACT inhaler Inhale 2 puffs into the lungs every 6 (six) hours as needed for wheezing or shortness of breath. 09/28/23  Yes Maree, Pratik D, DO  amLODipine  (NORVASC ) 10 MG tablet Take 10 mg by mouth daily.   Yes [provider]  ferrous sulfate  325 (65 FE) MG EC tablet Take 325 mg by mouth 3 (three) times a week. Tuesday, Thursday, and Saturday at dialysis   Yes [provider]  hydrALAZINE  (APRESOLINE ) 50 MG tablet Take 1 tablet (50 mg total) by mouth 2 (two) times daily. 09/15/23  Yes Tat, Alm, MD  losartan  (COZAAR ) 50 MG tablet Take 1 tablet (50 mg total) by mouth daily. 09/16/23  Yes Tat, Alm, MD  metoprolol  succinate (TOPROL -XL) 50 MG 24 hr tablet Take 50 mg by mouth daily. 10/15/22  Yes [provider]  sevelamer  carbonate (RENVELA ) 800 MG tablet Take 2,400 mg by mouth 3 (three) times daily with meals.   Yes [provider]    Physical Exam: Vitals:   12/05/23 2030 12/05/23 2100 12/05/23 2107 12/05/23 2110  BP: (!) 202/66 (!) 210/53 (!) 210/53 (!) 155/96  Pulse: 100 98  (!) 104  Resp: (!) 21 (!) 24  (!) 25  Temp:      TempSrc:      SpO2: 99% 100%  98%  Weight:      Height:        Constitutional: NAD, calm, comfortable Vitals:   12/05/23 2030 12/05/23 2100 12/05/23 2107 12/05/23 2110  BP: (!) 202/66 (!) 210/53 (!) 210/53 (!) 155/96  Pulse: 100 98  (!) 104  Resp: (!) 21 (!) 24  (!) 25  Temp:      TempSrc:      SpO2: 99% 100%  98%  Weight:      Height:       Eyes: PERRL, lids and conjunctivae  normal ENMT: Mucous membranes are moist. Neck: normal, supple, no masses, no thyromegaly Respiratory: Tachypneic, increased work of breathing with exertion, crackles bilaterally, very faint expiratory wheezing. Cardiovascular: Regular rate and rhythm, no murmurs / rubs / gallops. No extremity edema.  Extremities warm..  Abdomen: Not distended, no tenderness, no masses palpated. No hepatosplenomegaly. Bowel sounds positive.  Musculoskeletal: no clubbing / cyanosis. No joint deformity upper and lower extremities.  Skin: no rashes, lesions, ulcers. No induration Neurologic: No facial asymmetry, moving extremities spontaneously, speech fluent Psychiatric: Normal judgment and insight. Alert and oriented x 3. Normal mood.   Labs on Admission: I have personally reviewed following labs and imaging studies  CBC: Recent Labs  Lab 12/03/23 0319 12/03/23 0553 12/05/23 2038  WBC 11.9* 13.6* 14.3*  NEUTROABS  8.5*  --   --   HGB 8.8* 8.9* 8.5*  HCT 27.8* 28.2* 27.0*  MCV 85.8 86.5 87.7  PLT 356 350 420*   Basic Metabolic Panel: Recent Labs  Lab 12/03/23 0319 12/03/23 0553 12/05/23 2038  NA 143 142 141  K 4.1 3.8 4.1  CL 104 106 106  CO2 24 21* 21*  GLUCOSE 87 124* 111*  BUN 29* 29* 56*  CREATININE 6.75* 6.81* 6.85*  CALCIUM  9.1 8.8* 8.6*   GFR: Estimated Creatinine Clearance: 4.7 mL/min (A) (by C-G formula based on SCr of 6.85 mg/dL (H)). Liver Function Tests: Recent Labs  Lab 12/03/23 0319 12/05/23 2038  AST 14* 17  ALT 6 8  ALKPHOS 89 95  BILITOT 0.7 0.7  PROT 6.8 6.7  ALBUMIN  3.1* 3.1*   Urine analysis:    Component Value Date/Time   COLORURINE YELLOW 05/28/2022 1005   APPEARANCEUR CLOUDY (A) 05/28/2022 1005   LABSPEC 1.018 05/28/2022 1005   PHURINE 7.0 05/28/2022 1005   GLUCOSEU NEGATIVE 05/28/2022 1005   HGBUR SMALL (A) 05/28/2022 1005   BILIRUBINUR NEGATIVE 05/28/2022 1005   KETONESUR NEGATIVE 05/28/2022 1005   PROTEINUR >=300 (A) 05/28/2022 1005   NITRITE  NEGATIVE 05/28/2022 1005   LEUKOCYTESUR MODERATE (A) 05/28/2022 1005    Radiological Exams on Admission: DG Chest Port 1 View Result Date: 12/05/2023 CLINICAL DATA:  Shortness of breath. EXAM: PORTABLE CHEST 1 VIEW COMPARISON:  Radiograph 2 days ago 12/03/2023 FINDINGS: Again seen cardiomegaly. Moderate pleural effusions are unchanged from recent exam. Again seen diffuse interstitial thickening suspicious for pulmonary edema. Bibasilar opacities are unchanged. No pneumothorax. Left chest wall/axillary surgical clips. IMPRESSION: 1. Findings again suggestive of CHF. Unchanged cardiomegaly, moderate pleural effusions, and diffuse interstitial thickening suspicious for pulmonary edema. 2. Bibasilar opacities are unchanged. Electronically Signed   By: Andrea Gasman M.D.   On: 12/05/2023 21:18   EKG: Independently reviewed.  Sinus rhythm rate 98, QTc prolonged 521.  No significant change from prior.  Assessment/Plan Principal Problem:   COPD with acute exacerbation (HCC) Active Problems:   Fluid overload   Acute on chronic heart failure with preserved ejection fraction (HFpEF) (HCC)   Essential hypertension   Prolonged QT interval   ESRD (end stage renal disease) (HCC)  Assessment and Plan: * COPD with acute exacerbation (HCC) Initial wheezing on arrival to the ED has improved.  Presents with dyspnea and productive cough.  COPD possibly exacerbated by volume overload.  Not hypoxic, sats 97% on room air on my evaluation.  Placed on 2 L for comfort. -IV Solu-Medrol  125 mg x 1 given, continue 60 twice daily -Doxycycline  (Qt prolonged) - DuoNebs as needed and scheduled - Mucolytics  Acute on chronic heart failure with preserved ejection fraction (HFpEF) (HCC) Decompensated CHF, ESRD status.  Has not missed any HD sessions.  Last HD was here and in the hospital 8/2.  Recent echo 09/2023 EF of 60 to 65%.  Facial swelling otherwise no peripheral signs of edema.  Chest x-rays with finding of  moderate pleural effusions, pulmonary edema.  BNP 802, was up to 1645 during recent hospitalization. -Per HD -IV Lasix  80 mg x 1  Essential hypertension Blood pressure elevated to 210/53.  Wide pulse pressure. - Reports compliance with Norvasc , hydralazine , losartan  and metoprolol  today, resume -As needed labetalol  10 mg for systolic greater than 190.  ESRD (end stage renal disease) [facility]) Consult nephrology in a.m. for HD.  She is not hypoxic, potassium 4.1, blood pressure initially elevated has improved.  Prolonged QT interval Potassium 4.1.  Check magnesium    DVT prophylaxis: Heparin  Code Status: FULL Family Communication: None at bedside Disposition Plan: ~ 2 days Consults called:  Please consult nephrology in a.m. Admission status:  Obs Tele     Author: Tully FORBES Carwin, MD 12/05/2023 10:22 PM  For on call review [email or web address].

## 2023-12-05 NOTE — ED Notes (Signed)
 RT aware of need for another neb tx

## 2023-12-05 NOTE — Assessment & Plan Note (Signed)
 Initial wheezing on arrival to the ED has improved.  Presents with dyspnea and productive cough.  COPD possibly exacerbated by volume overload.  Not hypoxic, sats 97% on room air on my evaluation.  Placed on 2 L for comfort. -IV Solu-Medrol  125 mg x 1 given, continue 60 twice daily -Doxycycline  (Qt prolonged) - DuoNebs as needed and scheduled - Mucolytics

## 2023-12-05 NOTE — Assessment & Plan Note (Signed)
 Consult nephrology in a.m. for HD.  She is not hypoxic, potassium 4.1, blood pressure initially elevated has improved.

## 2023-12-05 NOTE — ED Notes (Signed)
 Pt taken to bathroom by NT, standby assist

## 2023-12-05 NOTE — Assessment & Plan Note (Signed)
 Potassium 4.1.  Check magnesium 

## 2023-12-05 NOTE — ED Provider Notes (Signed)
 [place] EMERGENCY DEPARTMENT AT [facility] Provider Note   CSN: [id number] Arrival date & time: 12/05/23  2003     Patient presents with: Shortness of Breath   Sheri Cooper is a 73 y.o. female.   73 year old female with history of ESRD and COPD who presents to the emergency department with shortness of breath.  Patient reports that this morning she woke up and felt very short of breath.  Has also had a productive cough.  States that she last got dialysis here at the hospital on 12/03/2023 when she was admitted and discharged on the same day and was feeling better initially.  Denies any fevers.  No chest pain.  No leg swelling.  No history of intubations for COPD. Not on home oxygen.        Prior to Admission medications   Medication Sig Start Date End Date Taking? Authorizing Provider  albuterol  (VENTOLIN  HFA) 108 (90 Base) MCG/ACT inhaler Inhale 2 puffs into the lungs every 6 (six) hours as needed for wheezing or shortness of breath. 09/28/23  Yes Maree, Pratik D, DO  amLODipine  (NORVASC ) 10 MG tablet Take 10 mg by mouth daily.   Yes [provider]  ferrous sulfate  325 (65 FE) MG EC tablet Take 325 mg by mouth 3 (three) times a week. Tuesday, Thursday, and Saturday at dialysis   Yes [provider]  hydrALAZINE  (APRESOLINE ) 50 MG tablet Take 1 tablet (50 mg total) by mouth 2 (two) times daily. 09/15/23  Yes Tat, Alm, MD  losartan  (COZAAR ) 50 MG tablet Take 1 tablet (50 mg total) by mouth daily. 09/16/23  Yes Tat, Alm, MD  metoprolol  succinate (TOPROL -XL) 50 MG 24 hr tablet Take 50 mg by mouth daily. 10/15/22  Yes [provider]  sevelamer  carbonate (RENVELA ) 800 MG tablet Take 2,400 mg by mouth 3 (three) times daily with meals.   Yes [provider]    Allergies: Patient has no known allergies.    Review of Systems  Updated Vital Signs BP (!) 227/58 (BP Location: Right Arm)   Pulse (!) 56   Temp 97.8 F (36.6 C) (Oral)    Resp (!) 24   Ht 4' 11 (1.499 m)   Wt 44.5 kg   SpO2 100%   BMI 19.81 kg/m   Physical Exam Vitals and nursing note reviewed.  Constitutional:      General: She is not in acute distress.    Appearance: She is well-developed.  HENT:     Head: Normocephalic and atraumatic.     Right Ear: External ear normal.     Left Ear: External ear normal.     Nose: Nose normal.  Eyes:     Extraocular Movements: Extraocular movements intact.     Conjunctiva/sclera: Conjunctivae normal.     Pupils: Pupils are equal, round, and reactive to light.  Cardiovascular:     Rate and Rhythm: Regular rhythm. Tachycardia present.     Heart sounds: No murmur heard. Pulmonary:     Effort: Pulmonary effort is normal. No respiratory distress.     Breath sounds: Wheezing and rales (Bibasilar) present.  Musculoskeletal:     Cervical back: Normal range of motion and neck supple.     Right lower leg: No edema.     Left lower leg: No edema.  Skin:    General: Skin is warm and dry.  Neurological:     Mental Status: She is alert and oriented to person, place, and time. Mental  status is at baseline.  Psychiatric:        Mood and Affect: Mood normal.     (all labs ordered are listed, but only abnormal results are displayed) Labs Reviewed  COMPREHENSIVE METABOLIC PANEL WITH GFR - Abnormal; Notable for the following components:      Result Value   CO2 21 (*)    Glucose, Bld 111 (*)    BUN 56 (*)    Creatinine, Ser 6.85 (*)    Calcium  8.6 (*)    Albumin  3.1 (*)    GFR, Estimated 6 (*)    All other components within normal limits  CBC - Abnormal; Notable for the following components:   WBC 14.3 (*)    RBC 3.08 (*)    Hemoglobin 8.5 (*)    HCT 27.0 (*)    RDW 19.5 (*)    Platelets 420 (*)    All other components within normal limits  BRAIN NATRIURETIC PEPTIDE - Abnormal; Notable for the following components:   B Natriuretic Peptide 802.0 (*)    All other components within normal limits  MAGNESIUM  -  Abnormal; Notable for the following components:   Magnesium  2.5 (*)    All other components within normal limits  RESP PANEL BY RT-PCR (RSV, FLU A&B, COVID)  [id number]  BASIC METABOLIC PANEL WITH GFR  CBC    EKG: EKG Interpretation Date/Time:  Monday December 05 2023 20:15:16 EDT Ventricular Rate:  98 PR Interval:  122 QRS Duration:  124 QT Interval:  408 QTC Calculation: 521 R Axis:   37  Text Interpretation: Sinus tachycardia Multiple premature complexes, vent & supraven Nonspecific intraventricular conduction delay Nonspecific repol abnormality, lateral leads Baseline wander in lead(s) V2 Confirmed by Yolande Charleston [id number]) on 12/05/2023 9:25:56 PM  Radiology: [facility] Chest Port 1 View Result Date: 12/05/2023 CLINICAL DATA:  Shortness of breath. EXAM: PORTABLE CHEST 1 VIEW COMPARISON:  Radiograph 2 days ago 12/03/2023 FINDINGS: Again seen cardiomegaly. Moderate pleural effusions are unchanged from recent exam. Again seen diffuse interstitial thickening suspicious for pulmonary edema. Bibasilar opacities are unchanged. No pneumothorax. Left chest wall/axillary surgical clips. IMPRESSION: 1. Findings again suggestive of CHF. Unchanged cardiomegaly, moderate pleural effusions, and diffuse interstitial thickening suspicious for pulmonary edema. 2. Bibasilar opacities are unchanged. Electronically Signed   By: Andrea Gasman M.D.   On: 12/05/2023 21:18     Procedures   Medications Ordered in the ED  amLODipine  (NORVASC ) tablet 10 mg (has no administration in time range)  hydrALAZINE  (APRESOLINE ) tablet 50 mg (has no administration in time range)  losartan  (COZAAR ) tablet 50 mg (has no administration in time range)  metoprolol  succinate (TOPROL -XL) 24 hr tablet 50 mg (has no administration in time range)  sevelamer  carbonate (RENVELA ) tablet 2,400 mg (has no administration in time range)  methylPREDNISolone  sodium succinate (SOLU-MEDROL ) 125 mg/2 mL injection 60 mg (has no administration in  time range)    Followed by  predniSONE  (DELTASONE ) tablet 40 mg (has no administration in time range)  doxycycline  (VIBRAMYCIN ) 100 mg in sodium chloride  0.9 % 250 mL IVPB (has no administration in time range)  heparin  injection 5,000 Units (has no administration in time range)  acetaminophen  (TYLENOL ) tablet 650 mg (has no administration in time range)    Or  acetaminophen  (TYLENOL ) suppository 650 mg (has no administration in time range)  polyethylene glycol (MIRALAX  / GLYCOLAX ) packet 17 g (has no administration in time range)  guaiFENesin -dextromethorphan  (ROBITUSSIN DM) 100-10 MG/5ML syrup 15 mL (15 mLs Oral Given 12/05/23  2305)  ipratropium-albuterol  (DUONEB) 0.5-2.5 (3) MG/3ML nebulizer solution 3 mL (has no administration in time range)  ipratropium-albuterol  (DUONEB) 0.5-2.5 (3) MG/3ML nebulizer solution 3 mL (has no administration in time range)  labetalol  (NORMODYNE ) injection 10 mg (has no administration in time range)  ipratropium-albuterol  (DUONEB) 0.5-2.5 (3) MG/3ML nebulizer solution 9 mL ( Nebulization Not Given 12/05/23 2048)  methylPREDNISolone  sodium succinate (SOLU-MEDROL ) 125 mg/2 mL injection 125 mg (125 mg Intravenous Given 12/05/23 2053)  hydrALAZINE  (APRESOLINE ) tablet 25 mg (25 mg Oral Given 12/05/23 2107)  albuterol  (PROVENTIL ) (2.5 MG/3ML) 0.083% nebulizer solution 5 mg (5 mg Nebulization Given 12/05/23 2138)  furosemide  (LASIX ) injection 80 mg (80 mg Intravenous Given 12/05/23 2306)    Clinical Course as of 12/05/23 2335  Mon Dec 05, 2023  2124 B Natriuretic Peptide(!): 802.0 Baseline 1600 [RP]  2153 Discussed with Dr. Pearlean from hospitalist for admission [RP]    Clinical Course User Index [RP] Yolande Lamar BROCKS, MD                                 Medical Decision Making Amount and/or Complexity of Data Reviewed Labs: ordered. Decision-making details documented in ED Course. Radiology: ordered.  Risk Prescription drug management. Decision regarding  hospitalization.   73 year old female with history of ESRD and COPD who presents to the emergency department with shortness of breath.  Initial Ddx:  Volume overload, hypertensive emergency, COPD exacerbation, pneumonia  MDM/Course:  Patient presents emergency department with shortness of breath.  Appears to be acute in onset.  Also having cough.  On exam is markedly hypertensive.  She does have bibasilar Rales as well as respiratory wheezing.  Does not appear to be in respiratory distress at this time.  Was given hydralazine  and upon re-evaluation blood pressure was in the 150s systolic.  Chest x-ray did show bilateral pleural effusions.  She was reassessed after breathing treatments and reported that she was feeling much better.  She is stable on 2 L nasal cannula.  Feel that she will need dialysis but does not need emergent dialysis at this time we will admit her to the hospitalist for blood pressure control as well as dialysis in the morning.  Suspect that her presentation is from COPD as well as volume overload  This patient presents to the ED for concern of complaints listed in HPI, this involves an extensive number of treatment options, and is a complaint that carries with it a high risk of complications and morbidity. Disposition including potential need for admission considered.   Dispo: Admit to Floor  Records reviewed Outpatient Clinic Notes The following labs were independently interpreted: Chemistry and show CKD I independently reviewed the following imaging with scope of interpretation limited to determining acute life threatening conditions related to emergency care: Chest x-ray and agree with the radiologist interpretation with the following exceptions: none I personally reviewed and interpreted cardiac monitoring: normal sinus rhythm  I personally reviewed and interpreted the pt's EKG: see above for interpretation  I have reviewed the patients home medications and made  adjustments as needed Consults: Hospitalist  Portions of this note were generated with Scientist, clinical (histocompatibility and immunogenetics). Dictation errors may occur despite best attempts at proofreading.     Final diagnoses:  COPD exacerbation (HCC)  Shortness of breath  Hypervolemia, unspecified hypervolemia type  Hypoxia    ED Discharge Orders     None          Yolande,  Lamar BROCKS, MD 12/05/23 [phone]

## 2023-12-05 NOTE — ED Triage Notes (Signed)
 Pt c/o increased sob since having dialysis here Saturday.

## 2023-12-05 NOTE — Assessment & Plan Note (Signed)
 Blood pressure elevated to 210/53.  Wide pulse pressure. - Reports compliance with Norvasc , hydralazine , losartan  and metoprolol  today, resume -As needed labetalol  10 mg for systolic greater than 190.

## 2023-12-06 DIAGNOSIS — Z87891 Personal history of nicotine dependence: Secondary | ICD-10-CM | POA: Diagnosis not present

## 2023-12-06 DIAGNOSIS — Z992 Dependence on renal dialysis: Secondary | ICD-10-CM | POA: Diagnosis not present

## 2023-12-06 DIAGNOSIS — E8770 Fluid overload, unspecified: Secondary | ICD-10-CM | POA: Diagnosis not present

## 2023-12-06 DIAGNOSIS — R0902 Hypoxemia: Secondary | ICD-10-CM | POA: Diagnosis present

## 2023-12-06 DIAGNOSIS — M898X9 Other specified disorders of bone, unspecified site: Secondary | ICD-10-CM | POA: Diagnosis present

## 2023-12-06 DIAGNOSIS — N186 End stage renal disease: Secondary | ICD-10-CM | POA: Diagnosis not present

## 2023-12-06 DIAGNOSIS — Z8711 Personal history of peptic ulcer disease: Secondary | ICD-10-CM | POA: Diagnosis not present

## 2023-12-06 DIAGNOSIS — I132 Hypertensive heart and chronic kidney disease with heart failure and with stage 5 chronic kidney disease, or end stage renal disease: Secondary | ICD-10-CM | POA: Diagnosis present

## 2023-12-06 DIAGNOSIS — E8779 Other fluid overload: Secondary | ICD-10-CM | POA: Diagnosis not present

## 2023-12-06 DIAGNOSIS — K279 Peptic ulcer, site unspecified, unspecified as acute or chronic, without hemorrhage or perforation: Secondary | ICD-10-CM | POA: Diagnosis present

## 2023-12-06 DIAGNOSIS — I120 Hypertensive chronic kidney disease with stage 5 chronic kidney disease or end stage renal disease: Secondary | ICD-10-CM | POA: Diagnosis not present

## 2023-12-06 DIAGNOSIS — T3695XA Adverse effect of unspecified systemic antibiotic, initial encounter: Secondary | ICD-10-CM | POA: Diagnosis not present

## 2023-12-06 DIAGNOSIS — M069 Rheumatoid arthritis, unspecified: Secondary | ICD-10-CM | POA: Diagnosis present

## 2023-12-06 DIAGNOSIS — R0602 Shortness of breath: Secondary | ICD-10-CM | POA: Diagnosis not present

## 2023-12-06 DIAGNOSIS — Z79899 Other long term (current) drug therapy: Secondary | ICD-10-CM | POA: Diagnosis not present

## 2023-12-06 DIAGNOSIS — K219 Gastro-esophageal reflux disease without esophagitis: Secondary | ICD-10-CM | POA: Diagnosis present

## 2023-12-06 DIAGNOSIS — J441 Chronic obstructive pulmonary disease with (acute) exacerbation: Secondary | ICD-10-CM | POA: Diagnosis not present

## 2023-12-06 DIAGNOSIS — N051 Unspecified nephritic syndrome with focal and segmental glomerular lesions: Secondary | ICD-10-CM | POA: Diagnosis present

## 2023-12-06 DIAGNOSIS — N2581 Secondary hyperparathyroidism of renal origin: Secondary | ICD-10-CM | POA: Diagnosis not present

## 2023-12-06 DIAGNOSIS — R9431 Abnormal electrocardiogram [ECG] [EKG]: Secondary | ICD-10-CM | POA: Diagnosis not present

## 2023-12-06 DIAGNOSIS — I5033 Acute on chronic diastolic (congestive) heart failure: Secondary | ICD-10-CM | POA: Diagnosis not present

## 2023-12-06 DIAGNOSIS — I10 Essential (primary) hypertension: Secondary | ICD-10-CM | POA: Diagnosis not present

## 2023-12-06 DIAGNOSIS — D631 Anemia in chronic kidney disease: Secondary | ICD-10-CM | POA: Diagnosis not present

## 2023-12-06 DIAGNOSIS — K521 Toxic gastroenteritis and colitis: Secondary | ICD-10-CM | POA: Diagnosis not present

## 2023-12-06 LAB — IRON AND TIBC
Iron: 19 ug/dL — ABNORMAL LOW (ref 28–170)
Saturation Ratios: 8 % — ABNORMAL LOW (ref 10.4–31.8)
TIBC: 235 ug/dL — ABNORMAL LOW (ref 250–450)
UIBC: 216 ug/dL

## 2023-12-06 LAB — BASIC METABOLIC PANEL WITH GFR
Anion gap: 16 — ABNORMAL HIGH (ref 5–15)
BUN: 60 mg/dL — ABNORMAL HIGH (ref 8–23)
CO2: 19 mmol/L — ABNORMAL LOW (ref 22–32)
Calcium: 8.8 mg/dL — ABNORMAL LOW (ref 8.9–10.3)
Chloride: 105 mmol/L (ref 98–111)
Creatinine, Ser: 7.05 mg/dL — ABNORMAL HIGH (ref 0.44–1.00)
GFR, Estimated: 6 mL/min — ABNORMAL LOW (ref >60)
Glucose, Bld: 123 mg/dL — ABNORMAL HIGH (ref 70–99)
Potassium: 4.6 mmol/L (ref 3.5–5.1)
Sodium: 140 mmol/L (ref 135–145)

## 2023-12-06 LAB — CBC
HCT: 26.1 % — ABNORMAL LOW (ref 36.0–46.0)
Hemoglobin: 8.4 g/dL — ABNORMAL LOW (ref 12.0–15.0)
MCH: 28 pg (ref 26.0–34.0)
MCHC: 32.2 g/dL (ref 30.0–36.0)
MCV: 87 fL (ref 80.0–100.0)
Platelets: 416 K/uL — ABNORMAL HIGH (ref 150–400)
RBC: 3 MIL/uL — ABNORMAL LOW (ref 3.87–5.11)
RDW: 19.6 % — ABNORMAL HIGH (ref 11.5–15.5)
WBC: 11.9 K/uL — ABNORMAL HIGH (ref 4.0–10.5)
nRBC: 0 % (ref 0.0–0.2)

## 2023-12-06 LAB — PHOSPHORUS: Phosphorus: 2.9 mg/dL (ref 2.5–4.6)

## 2023-12-06 LAB — FERRITIN: Ferritin: 274 ng/mL (ref 11–307)

## 2023-12-06 MED ORDER — MIDODRINE HCL 5 MG PO TABS
ORAL_TABLET | ORAL | Status: AC
Start: 2023-12-06 — End: 2023-12-06
  Filled 2023-12-06: qty 2

## 2023-12-06 MED ORDER — MIDODRINE HCL 5 MG PO TABS
10.0000 mg | ORAL_TABLET | Freq: Once | ORAL | Status: AC
  Administered 2023-12-06: 10 mg via ORAL

## 2023-12-06 MED ORDER — ALBUMIN HUMAN 25 % IV SOLN
INTRAVENOUS | Status: AC
  Filled 2023-12-06: qty 100

## 2023-12-06 MED ORDER — CHLORHEXIDINE GLUCONATE CLOTH 2 % EX PADS
6.0000 | MEDICATED_PAD | Freq: Every day | CUTANEOUS | Status: DC
  Administered 2023-12-06 – 2023-12-08 (×3): 6 via TOPICAL

## 2023-12-06 MED ORDER — CHLORHEXIDINE GLUCONATE CLOTH 2 % EX PADS
6.0000 | MEDICATED_PAD | Freq: Every day | CUTANEOUS | Status: DC
  Administered 2023-12-07 – 2023-12-08 (×2): 6 via TOPICAL

## 2023-12-06 MED ORDER — ALBUMIN HUMAN 25 % IV SOLN
25.0000 g | Freq: Once | INTRAVENOUS | Status: AC
  Administered 2023-12-06: 25 g via INTRAVENOUS

## 2023-12-06 NOTE — TOC Initial Note (Signed)
 Transition of Care [facility]) - Initial/Assessment Note    Patient Details  Name: Sheri Cooper MRN: [id number] Date of Birth: 05/19/1950  [facility] CM/SW Contact:    Sharlyne Stabs, RN Phone Number: 12/06/2023, 11:33 AM  Patient recently discharged and assessed. Readmitted with COPD exacerbation. Patient has a high risk readmission score.  She lives alone and is independent with ADLs.  Her Brother is her support. Attends outpatient HD at [facility]. She rides [employer]. Pt anticipating that she will return home with no needs at dc. TOC will continue to follow      Expected Discharge Plan: Home/Self Care Barriers to Discharge: Continued Medical Work up   Patient Goals and CMS Choice Patient states their goals for this hospitalization and ongoing recovery are:: Return home CMS Medicare.gov Compare Post Acute Care list provided to:: Patient Choice offered to / list presented to : Patient      Expected Discharge Plan and Services       Living arrangements for the past 2 months: Apartment                     Prior Living Arrangements/Services Living arrangements for the past 2 months: Apartment Lives with:: Self Patient language and need for interpreter reviewed:: Yes        Need for Family Participation in Patient Care: Yes (Comment) Care giver support system in place?: Yes (comment) Current home services: DME Criminal Activity/Legal Involvement Pertinent to Current Situation/Hospitalization: No - Comment as needed  Activities of Daily Living   ADL Screening (condition at time of admission) Independently performs ADLs?: Yes (appropriate for developmental age) Is the patient deaf or have difficulty hearing?: No Does the patient have difficulty seeing, even when wearing glasses/contacts?: No Does the patient have difficulty concentrating, remembering, or making decisions?: No  Permission Sought/Granted            Permission granted to share info w  Relationship: Brother     Emotional Assessment     Affect (typically observed): Accepting Orientation: : Oriented to Self, Oriented to Situation, Oriented to Place Alcohol / Substance Use: Not Applicable Psych Involvement: No (comment)  Admission diagnosis:  COPD with acute exacerbation (HCC) [J44.1] Patient Active Problem List   Diagnosis Date Noted   Essential hypertension 12/03/2023   COPD (chronic obstructive pulmonary disease) (HCC) 12/03/2023   Peptic ulcer disease 12/03/2023   Hyperkalemia 09/27/2023   ESRD on hemodialysis (HCC) 09/26/2023   Acute on chronic heart failure with preserved ejection fraction (HFpEF) (HCC) 09/14/2023   Pneumonia 09/08/2023   Fluid overload 01/25/2023   COPD with acute exacerbation (HCC) 01/25/2023   Primary localized osteoarthrosis of multiple sites 09/14/2022   Weight loss 09/14/2022   Acute respiratory failure with hypoxia (HCC) 09/07/2022   Lobar pneumonia (HCC) 09/07/2022   ESRD (end stage renal disease) (HCC) 09/07/2022   Sepsis due to undetermined organism (HCC) 05/30/2022   Pneumoperitoneum 05/28/2022   Duodenal perforation (HCC) 05/28/2022   Peritonitis (HCC) 05/28/2022   Acute upper GI bleed 05/24/2022   Multiple duodenal ulcers 05/24/2022   ABLA (acute blood loss anemia) 05/24/2022   Melena 05/22/2022   HCAP (healthcare-associated pneumonia) 05/16/2022   Hypothermia 05/16/2022   Acute renal failure superimposed on stage 3b chronic kidney disease (HCC) 05/16/2022   Elevated brain natriuretic peptide (BNP) level 05/16/2022   Elevated troponin 05/16/2022   Increased anion gap metabolic acidosis 05/16/2022   Prolonged QT interval 05/16/2022   Hypoalbuminemia due to protein-calorie malnutrition (HCC)  05/16/2022   Iron  deficiency anemia 05/16/2022   CKD (chronic kidney disease), stage V (HCC) 05/16/2022   Renal mass 05/14/2022   Simple renal cyst 05/14/2022   Vitamin D deficiency 05/14/2022   Chronic obstructive pulmonary  disease with (acute) exacerbation (HCC) 05/07/2022   Pneumonia due to infectious organism 05/07/2022   Acute nontraumatic kidney injury (HCC) 05/07/2022   Mixed hyperlipidemia 05/07/2022   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   Primary hypertension 05/07/2022   Rheumatoid arthritis involving both hands with negative rheumatoid factor (HCC) 05/07/2022   Medication therapy management recommendation declined by patient 07/28/2020   Encounter for monitoring aromatase inhibitor therapy 06/12/2020   History of radiation therapy 06/12/2020   Involvement of right lateral surgical margin by neoplasm 04/16/2020   Status post breast lumpectomy 04/16/2020   Coordination of complex care 01/23/2020   Osteopenia of multiple sites 01/23/2020   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 - [place], [place] - [address] [place] [address] Phone: [phone] Fax: [phone]     Social Drivers of Health (SDOH) Social History: SDOH Screenings   Food Insecurity: No Food Insecurity (12/05/2023)  Housing: Low Risk  (12/05/2023)  Transportation Needs: No Transportation Needs (12/05/2023)  Utilities: Not At Risk (12/05/2023)  Alcohol Screen: Low Risk  (03/07/2023)  Depression (PHQ2-9): Low Risk  (03/07/2023)  Financial Resource Strain: Low Risk  (03/07/2023)  Physical Activity: Inactive (03/07/2023)  Social Connections: Unknown (12/05/2023)  Stress: No Stress Concern Present (03/07/2023)  Tobacco Use: Medium Risk (12/05/2023)  Health Literacy: Adequate Health Literacy (03/07/2023)   SDOH Interventions:     Readmission Risk  Interventions    12/06/2023   11:32 AM 09/28/2023    8:15 AM 09/09/2023   10:03 AM  Readmission Risk Prevention Plan  Transportation Screening Complete Complete Complete  HRI or Home Care Consult  Complete Complete  Social Work Consult for Recovery Care Planning/Counseling  Complete Complete  Palliative Care Screening  Not Applicable Not Applicable  Medication Review Oceanographer) Complete Complete Complete  HRI or Home Care Consult Complete    Palliative Care Screening Not Applicable    Skilled Nursing Facility Not Applicable

## 2023-12-06 NOTE — Progress Notes (Signed)
 Pt. Receives OP HD, [facility], TTS, chair time 11:00am. Contacted clinic to inform she will not be at appt today. Will continue to assist as needed.   [facility] Dialysis Navigator [phone]  [facility]#- [phone]

## 2023-12-06 NOTE — Plan of Care (Signed)

## 2023-12-06 NOTE — Consult Note (Signed)
 ESRD Consult Note  Requesting provider: Afton Louder Service requesting consult: Hospitalist Reason for consult: ESRD, provision of dialysis Indication for acute dialysis?: End Stage Renal Disease  Outpatient dialysis unit: [facility] Outpatient dialysis prescription:   Assessment/Recommendations: Sheri Cooper is a/an 73 y.o. female with a past medical history notable for ESRD on HD admitted with shortness of breath   # ESRD: HD today on TTS schedule. Dialysis prescription adjusted based on labs  # Volume/ hypertension: frequent problems with volume overload.  Dialysis with ultrafiltration today.continue home blood pressure medications.  Shortness of breath as below  # Anemia of Chronic Kidney Disease:hemoglobin in the eights.  Obtain iron  studies today.  Could consider ESA  # Secondary Hyperparathyroidism/Hyperphosphatemia: calcium  corrects to normal.  Obtain phosphorus level.continue home sevelamer .  # Vascular access: AVG with no issues  # shortness of breath: volume overload contributing but also being treated for COPD exacerbation.  Likely multifactorial.  She does not have a pulmonologist.  Recommend referral to pulmonology outpatient   # Additional recommendations: - Dose all meds for creatinine clearance < 10 ml/min  - Unless absolutely necessary, no MRIs with gadolinium.  - Implement save arm precautions.  Prefer needle sticks in the dorsum of the hands or wrists.  No blood pressure measurements in arm. - If blood transfusion is requested during hemodialysis sessions, please alert us  prior to the session.  - Use synthetic opioids (Fentanyl /Dilaudid ) if needed  Recommendations were discussed with the primary team.   History of Present Illness: Sheri Cooper is a/an 73 y.o. female with a past medical history of ESRD who presents with chief complaint of shortness of breath  Patient states she has had chronic problems with shortness of breath but they have  been worse recently.  She has frequently come to the hospital for dialysis because of shortness of breath.  She states sometimes dialysis in the outpatient setting is difficult and will make her feel sick so she cannot get the whole treatment.  She was able to adjust her dialysate ultrafiltration profile which helps some but then over the past week or so she has had more difficulty with shortness of breath.  She also states she has a productive cough.  Denies missing any dialysis.  Denies fevers, chills, nausea, vomiting, diarrhea.  She presented over the weekend and received dialysis on 8/2 and was discharged from the hospital.  She returned because of persistent and worsening shortness of breath   Medications:  Current Facility-Administered Medications  Medication Dose Route Frequency Provider Last Rate Last Admin   acetaminophen  (TYLENOL ) tablet 650 mg  650 mg Oral Q6H PRN Emokpae, Ejiroghene E, MD   650 mg at 12/06/23 0841   Or   acetaminophen  (TYLENOL ) suppository 650 mg  650 mg Rectal Q6H PRN Emokpae, Ejiroghene E, MD       amLODipine  (NORVASC ) tablet 10 mg  10 mg Oral Daily Emokpae, Ejiroghene E, MD       Chlorhexidine  Gluconate Cloth 2 % PADS 6 each  6 each Topical Daily Emokpae, Ejiroghene E, MD       Chlorhexidine  Gluconate Cloth 2 % PADS 6 each  6 each Topical Q0600 Macel Jayson PARAS, MD       doxycycline  (VIBRAMYCIN ) 100 mg in sodium chloride  0.9 % 250 mL IVPB  100 mg Intravenous Q12H Emokpae, Ejiroghene E, MD 125 mL/hr at 12/06/23 0026 100 mg at 12/06/23 0026   guaiFENesin -dextromethorphan  (ROBITUSSIN DM) 100-10 MG/5ML syrup 15 mL  15 mL Oral Q8H  Emokpae, Ejiroghene E, MD   15 mL at 12/06/23 0538   heparin  injection 5,000 Units  5,000 Units Subcutaneous Q8H Emokpae, Ejiroghene E, MD   5,000 Units at 12/06/23 0538   hydrALAZINE  (APRESOLINE ) tablet 50 mg  50 mg Oral BID Emokpae, Ejiroghene E, MD       ipratropium-albuterol  (DUONEB) 0.5-2.5 (3) MG/3ML nebulizer solution 3 mL  3 mL  Nebulization Q4H PRN Emokpae, Ejiroghene E, MD       labetalol  (NORMODYNE ) injection 10 mg  10 mg Intravenous Q2H PRN Emokpae, Ejiroghene E, MD   10 mg at 12/06/23 0201   losartan  (COZAAR ) tablet 50 mg  50 mg Oral Daily Emokpae, Ejiroghene E, MD       methylPREDNISolone  sodium succinate (SOLU-MEDROL ) 125 mg/2 mL injection 60 mg  60 mg Intravenous Q12H Emokpae, Ejiroghene E, MD       Followed by   NOREEN ON 12/07/2023] predniSONE  (DELTASONE ) tablet 40 mg  40 mg Oral Q breakfast Emokpae, Ejiroghene E, MD       metoprolol  succinate (TOPROL -XL) 24 hr tablet 50 mg  50 mg Oral Daily Emokpae, Ejiroghene E, MD       polyethylene glycol (MIRALAX  / GLYCOLAX ) packet 17 g  17 g Oral Daily PRN Emokpae, Ejiroghene E, MD       sevelamer  carbonate (RENVELA ) tablet 2,400 mg  2,400 mg Oral TID WC Emokpae, Ejiroghene E, MD         ALLERGIES Patient has no known allergies.  MEDICAL HISTORY Past Medical History:  Diagnosis Date   Anemia    Chronic kidney disease (CKD), stage IV (severe) (HCC)    COPD (chronic obstructive pulmonary disease) (HCC)    ESRD (end stage renal disease) (HCC)    Hemodialysis T, TH, SAT, [address] , [place]   GERD (gastroesophageal reflux disease)    History of blood transfusion    History of blood transfusion    Hypertension    Pneumonia    3 times in 2024-   PUD (peptic ulcer disease) 05/2022   Rheumatoid arthritis (HCC)    Rheumatoid arthritis (HCC)      SOCIAL HISTORY Social History   Socioeconomic History   Marital status: Significant Other    Spouse name: Not on file   Number of children: 0   Years of education: 12   Highest education level: 12th grade  Occupational History   Not on file  Tobacco Use   Smoking status: Former    Types: Cigarettes    Passive exposure: Past   Smokeless tobacco: Never  Vaping Use   Vaping status: Never Used  Substance and Sexual Activity   Alcohol use: Not Currently   Drug use: Never   Sexual activity: Not Currently  Other  Topics Concern   Not on file  Social History Narrative   Not on file   Social Drivers of Health   Financial Resource Strain: Low Risk  (03/07/2023)   Overall Financial Resource Strain (CARDIA)    Difficulty of Paying Living Expenses: Not hard at all  Food Insecurity: No Food Insecurity (12/05/2023)   Hunger Vital Sign    Worried About Running Out of Food in the Last Year: Never true    Ran Out of Food in the Last Year: Never true  Transportation Needs: No Transportation Needs (12/05/2023)   PRAPARE - Administrator, Civil Service (Medical): No    Lack of Transportation (Non-Medical): No  Physical Activity: Inactive (03/07/2023)   Exercise Vital Sign  Days of Exercise per Week: 0 days    Minutes of Exercise per Session: 0 min  Stress: No Stress Concern Present (03/07/2023)   [employer] of Occupational Health - Occupational Stress Questionnaire    Feeling of Stress : Only a little  Social Connections: Unknown (12/05/2023)   Social Connection and Isolation Panel    Frequency of Communication with Friends and Family: More than three times a week    Frequency of Social Gatherings with Friends and Family: Three times a week    Attends Religious Services: Never    Active Member of Clubs or Organizations: No    Attends Banker Meetings: Never    Marital Status: Patient declined  Intimate Partner Violence: Not At Risk (12/05/2023)   Humiliation, Afraid, Rape, and Kick questionnaire    Fear of Current or Ex-Partner: No    Emotionally Abused: No    Physically Abused: No    Sexually Abused: No     FAMILY HISTORY History reviewed. No pertinent family history.   Review of Systems: 12 systems were reviewed and negative except per HPI  Physical Exam: Vitals:   12/06/23 0737 12/06/23 0837  BP:  111/78  Pulse:  97  Resp:    Temp:    SpO2: 99% 100%   No intake/output data recorded.  Intake/Output Summary (Last 24 hours) at 12/06/2023 0911 Last data filed at  12/06/2023 0500 Gross per 24 hour  Intake 250 ml  Output --  Net 250 ml   General: well-appearing, mild distress HEENT: anicteric sclera, MMM CV: normal rate, no rub, no lower extremity edema Lungs: bilateral chest rise, mild increased work of breathing, crackles bilaterally Abd: soft, non-tender, non-distended Skin: no visible lesions or rashes Psych: alert, engaged, appropriate mood and affect Neuro: normal speech, no gross focal deficits   Test Results Reviewed Lab Results  Component Value Date   NA 140 12/06/2023   K 4.6 12/06/2023   CL 105 12/06/2023   CO2 19 (L) 12/06/2023   BUN 60 (H) 12/06/2023   CREATININE 7.05 (H) 12/06/2023   CALCIUM  8.8 (L) 12/06/2023   ALBUMIN  3.1 (L) 12/05/2023   PHOS 5.3 (H) 09/26/2023    I have reviewed relevant outside healthcare records

## 2023-12-06 NOTE — Progress Notes (Signed)
 PROGRESS NOTE   Sheri Cooper  FMW:[id number] DOB: December 10, 1950 DOA: 12/05/2023 PCP: Toribio Jerel MATSU, MD   Chief Complaint  Patient presents with   Shortness of Breath   Level of care: Telemetry  Brief Admission History:  73 y.o. female with medical history significant for ESRD on dialysis-Tuesday Thursday Saturday, hypertension, COPD, GI bleed.  Patient presented to the ED with complaints of difficulty breathing and productive cough that started this morning.  She has not missed any dialysis sessions.  She makes a little amount of urine.  No chest pain.  Lower extremity swelling.  No abdominal bloating.  She reports facial swelling -typically occurs when she is volume overloaded.    Patient was hospitalized and discharged same day 8/2, for volume overload secondary to ESRD, she was dialyzed and discharged home.   ED Course: Temperature 97.7.  Heart rate 79-107.  Respiratory rate 15-33.  Blood pressure elevated systolic up to 210.  O2 sats on my evaluation remained at 97% on room air.  She was placed on 2 L nasal cannula for comfort.  WBC 14.3.  BNP 802.  BUN of 56.  Potassium 4.1.  She was admitted for COPD exacerbation, DOE, volume overload.     Assessment and Plan:  COPD with mild acute exacerbation  Initial wheezing on arrival to the ED has improved.  Presents with dyspnea and productive cough.  COPD possibly exacerbated by volume overload.  Not hypoxic, sats 97% on room air on my evaluation.  Placed on 2 L for comfort. - IV Solu-Medrol  125 mg x 1 given, then 60 mg BID x 1 day, then oral prednisone  - Doxycycline   - DuoNebs as needed and scheduled - Mucolytics  Essential hypertension -resumed home Norvasc , hydralazine , losartan  and metoprolol   -As needed labetalol  10 mg for systolic greater than 190.  Acute on chronic heart failure with preserved ejection fraction (HFpEF)  Decompensated CHF, ESRD status.  Has not missed any HD sessions.  Last HD was here and in the hospital 8/2.   Recent echo 09/2023 EF of 60 to 65%.  Facial swelling otherwise no peripheral signs of edema.  Chest x-rays with finding of moderate pleural effusions, pulmonary edema.  BNP 802, was up to 1645 during recent hospitalization. -volume management Per nephrologist with HD treatments  -IV Lasix  80 mg x 1 given in ED   ESRD (end stage renal disease)  Consulted nephrology for HD.  She is not hypoxic, potassium 4.6 HD later today per Dr. Macel  Prolonged QT interval Potassium 4.1. Mg 2.5, phos 2.9   DVT prophylaxis: sq heparin  Code Status: Full  Communication: updated pt with plan of care at bedside Disposition: TBD    Consultants:  Nephrology   Procedures:  Hemodialysis 8/5  Antimicrobials:    Subjective: Pt reports she gets very SOB with minimal exertion.   Objective: Vitals:   12/06/23 0737 12/06/23 0837 12/06/23 0950 12/06/23 1253  BP:  111/78 118/70 124/81  Pulse:  97  98  Resp:    (!) 22  Temp:    98.1 F (36.7 C)  TempSrc:    Oral  SpO2: 99% 100%  98%  Weight:      Height:        Intake/Output Summary (Last 24 hours) at 12/06/2023 1310 Last data filed at 12/06/2023 0500 Gross per 24 hour  Intake 250 ml  Output --  Net 250 ml   Filed Weights   12/05/23 2005 12/05/23 2258 12/06/23 0539  Weight: 41.5 kg  44.5 kg 44.5 kg   Examination:  General exam: Appears calm and comfortable does NOT appear to be in any distress. Respiratory system: Clear to auscultation. Respiratory effort normal. Cardiovascular system: normal S1 & S2 heard. No JVD, murmurs, rubs, gallops or clicks. No pedal edema. Gastrointestinal system: Abdomen is nondistended, soft and nontender. No organomegaly or masses felt. Normal bowel sounds heard. Central nervous system: Alert and oriented. No focal neurological deficits. Extremities: Symmetric 5 x 5 power. Skin: No rashes, lesions or ulcers. Psychiatry: Judgement and insight appear normal. Mood & affect appropriate.   Data Reviewed: I have  personally reviewed following labs and imaging studies  CBC: Recent Labs  Lab 12/03/23 0319 12/03/23 0553 12/05/23 2038 12/06/23 0452  WBC 11.9* 13.6* 14.3* 11.9*  NEUTROABS 8.5*  --   --   --   HGB 8.8* 8.9* 8.5* 8.4*  HCT 27.8* 28.2* 27.0* 26.1*  MCV 85.8 86.5 87.7 87.0  PLT 356 350 420* 416*    Basic Metabolic Panel: Recent Labs  Lab 12/03/23 0319 12/03/23 0553 12/05/23 2038 12/06/23 0452 12/06/23 0519  NA 143 142 141 140  --   K 4.1 3.8 4.1 4.6  --   CL 104 106 106 105  --   CO2 24 21* 21* 19*  --   GLUCOSE 87 124* 111* 123*  --   BUN 29* 29* 56* 60*  --   CREATININE 6.75* 6.81* 6.85* 7.05*  --   CALCIUM  9.1 8.8* 8.6* 8.8*  --   MG  --   --  2.5*  --   --   PHOS  --   --   --   --  2.9    CBG: No results for input(s): GLUCAP in the last 168 hours.  Recent Results (from the past 240 hours)  MRSA Next Gen by PCR, Nasal     Status: None   Collection Time: 12/03/23  4:20 AM   Specimen: Nasal Mucosa; Nasal Swab  Result Value Ref Range Status   MRSA by PCR Next Gen NOT DETECTED NOT DETECTED Final    Comment: (NOTE) The GeneXpert MRSA Assay (FDA approved for NASAL specimens only), is one component of a comprehensive MRSA colonization surveillance program. It is not intended to diagnose MRSA infection nor to guide or monitor treatment for MRSA infections. Test performance is not FDA approved in patients less than 47 years old. Performed at [facility], [address], [place], [place] [address]   Resp panel by RT-PCR (RSV, Flu A&B, Covid) Anterior Nasal Swab     Status: None   Collection Time: 12/05/23  8:18 PM   Specimen: Anterior Nasal Swab  Result Value Ref Range Status   SARS Coronavirus 2 by RT PCR NEGATIVE NEGATIVE Final    Comment: (NOTE) SARS-CoV-2 target nucleic acids are NOT DETECTED.  The SARS-CoV-2 RNA is generally detectable in upper respiratory specimens during the acute phase of infection. The lowest concentration of SARS-CoV-2 viral copies  this assay can detect is 138 copies/mL. A negative result does not preclude SARS-Cov-2 infection and should not be used as the sole basis for treatment or other patient management decisions. A negative result may occur with  improper specimen collection/handling, submission of specimen other than nasopharyngeal swab, presence of viral mutation(s) within the areas targeted by this assay, and inadequate number of viral copies(<138 copies/mL). A negative result must be combined with clinical observations, patient history, and epidemiological information. The expected result is Negative.  Fact Sheet for Patients:  BloggerCourse.com  Fact Sheet for Healthcare Providers:  SeriousBroker.it  This test is no t yet approved or cleared by the [place]  FDA and  has been authorized for detection and/or diagnosis of SARS-CoV-2 by FDA under an Emergency Use Authorization (EUA). This EUA will remain  in effect (meaning this test can be used) for the duration of the COVID-19 declaration under Section 564(b)(1) of the Act, 21 U.S.C.section 360bbb-3(b)(1), unless the authorization is terminated  or revoked sooner.       Influenza A by PCR NEGATIVE NEGATIVE Final   Influenza B by PCR NEGATIVE NEGATIVE Final    Comment: (NOTE) The Xpert Xpress SARS-CoV-2/FLU/RSV plus assay is intended as an aid in the diagnosis of influenza from Nasopharyngeal swab specimens and should not be used as a sole basis for treatment. Nasal washings and aspirates are unacceptable for Xpert Xpress SARS-CoV-2/FLU/RSV testing.  Fact Sheet for Patients: BloggerCourse.com  Fact Sheet for Healthcare Providers: SeriousBroker.it  This test is not yet approved or cleared by the [place]  FDA and has been authorized for detection and/or diagnosis of SARS-CoV-2 by FDA under an Emergency Use Authorization (EUA). This EUA  will remain in effect (meaning this test can be used) for the duration of the COVID-19 declaration under Section 564(b)(1) of the Act, 21 U.S.C. section 360bbb-3(b)(1), unless the authorization is terminated or revoked.     Resp Syncytial Virus by PCR NEGATIVE NEGATIVE Final    Comment: (NOTE) Fact Sheet for Patients: BloggerCourse.com  Fact Sheet for Healthcare Providers: SeriousBroker.it  This test is not yet approved or cleared by the [place]  FDA and has been authorized for detection and/or diagnosis of SARS-CoV-2 by FDA under an Emergency Use Authorization (EUA). This EUA will remain in effect (meaning this test can be used) for the duration of the COVID-19 declaration under Section 564(b)(1) of the Act, 21 U.S.C. section 360bbb-3(b)(1), unless the authorization is terminated or revoked.  Performed at [facility], [address] [place], [place] [address]      Radiology Studies: [facility] [place] [facility] 1 View Result Date: 12/05/2023 CLINICAL DATA:  Shortness of breath. EXAM: PORTABLE CHEST 1 VIEW COMPARISON:  Radiograph 2 days ago 12/03/2023 FINDINGS: Again seen cardiomegaly. Moderate pleural effusions are unchanged from recent exam. Again seen diffuse interstitial thickening suspicious for pulmonary edema. Bibasilar opacities are unchanged. No pneumothorax. Left chest wall/axillary surgical clips. IMPRESSION: 1. Findings again suggestive of CHF. Unchanged cardiomegaly, moderate pleural effusions, and diffuse interstitial thickening suspicious for pulmonary edema. 2. Bibasilar opacities are unchanged. Electronically Signed   By: Andrea Gasman M.D.   On: 12/05/2023 21:18    Scheduled Meds:  amLODipine   10 mg Oral Daily   Chlorhexidine  Gluconate Cloth  6 each Topical Daily   Chlorhexidine  Gluconate Cloth  6 each Topical Q0600   guaiFENesin -dextromethorphan   15 mL Oral Q8H   heparin   5,000 Units Subcutaneous Q8H   hydrALAZINE   50  mg Oral BID   losartan   50 mg Oral Daily   methylPREDNISolone  (SOLU-MEDROL ) injection  60 mg Intravenous Q12H   Followed by   NOREEN ON 12/07/2023] predniSONE   40 mg Oral Q breakfast   metoprolol  succinate  50 mg Oral Daily   sevelamer  carbonate  2,400 mg Oral TID WC   Continuous Infusions:  doxycycline  (VIBRAMYCIN ) IV 100 mg (12/06/23 1140)     LOS: 0 days   Time spent: 57 mins  Hermenia Fritcher, MD How to contact the [facility] Attending or Consulting provider 7A - 7P or covering provider during  after hours 7P -7A, for this patient?  Check the care team in [facility] and look for a) attending/consulting TRH provider listed and b) the TRH team listed Log into [email or web address] to find provider on call.  Locate the TRH provider you are looking for under [facility] and page to a number that you can be directly reached. If you still have difficulty reaching the provider, please page the [facility] (Director on Call) for the Hospitalists listed on amion for assistance.  12/06/2023, 1:10 PM

## 2023-12-06 NOTE — Hospital Course (Signed)
 73 y.o. female with medical history significant for ESRD on dialysis-Tuesday Thursday Saturday, hypertension, COPD, GI bleed.  Patient presented to the ED with complaints of difficulty breathing and productive cough that started this morning.  She has not missed any dialysis sessions.  She makes a little amount of urine.  No chest pain.  Lower extremity swelling.  No abdominal bloating.  She reports facial swelling -typically occurs when she is volume overloaded.    Patient was hospitalized and discharged same day 8/2, for volume overload secondary to ESRD, she was dialyzed and discharged home.   ED Course: Temperature 97.7.  Heart rate 79-107.  Respiratory rate 15-33.  Blood pressure elevated systolic up to 210.  O2 sats on my evaluation remained at 97% on room air.  She was placed on 2 L nasal cannula for comfort.  WBC 14.3.  BNP 802.  BUN of 56.  Potassium 4.1.  She was admitted for COPD exacerbation, DOE, volume overload.

## 2023-12-07 DIAGNOSIS — E8779 Other fluid overload: Secondary | ICD-10-CM | POA: Diagnosis not present

## 2023-12-07 DIAGNOSIS — J441 Chronic obstructive pulmonary disease with (acute) exacerbation: Secondary | ICD-10-CM | POA: Diagnosis not present

## 2023-12-07 DIAGNOSIS — N186 End stage renal disease: Secondary | ICD-10-CM | POA: Diagnosis not present

## 2023-12-07 LAB — CBC WITH DIFFERENTIAL/PLATELET
Abs Immature Granulocytes: 0.13 K/uL — ABNORMAL HIGH (ref 0.00–0.07)
Basophils Absolute: 0 K/uL (ref 0.0–0.1)
Basophils Relative: 0 %
Eosinophils Absolute: 0 K/uL (ref 0.0–0.5)
Eosinophils Relative: 0 %
HCT: 25.8 % — ABNORMAL LOW (ref 36.0–46.0)
Hemoglobin: 8.3 g/dL — ABNORMAL LOW (ref 12.0–15.0)
Immature Granulocytes: 1 %
Lymphocytes Relative: 7 %
Lymphs Abs: 0.9 K/uL (ref 0.7–4.0)
MCH: 27 pg (ref 26.0–34.0)
MCHC: 32.2 g/dL (ref 30.0–36.0)
MCV: 84 fL (ref 80.0–100.0)
Monocytes Absolute: 0.3 K/uL (ref 0.1–1.0)
Monocytes Relative: 2 %
Neutro Abs: 12 K/uL — ABNORMAL HIGH (ref 1.7–7.7)
Neutrophils Relative %: 90 %
Platelets: 422 K/uL — ABNORMAL HIGH (ref 150–400)
RBC: 3.07 MIL/uL — ABNORMAL LOW (ref 3.87–5.11)
RDW: 20 % — ABNORMAL HIGH (ref 11.5–15.5)
WBC: 13.4 K/uL — ABNORMAL HIGH (ref 4.0–10.5)
nRBC: 0 % (ref 0.0–0.2)

## 2023-12-07 LAB — RENAL FUNCTION PANEL
Albumin: 3.8 g/dL (ref 3.5–5.0)
Anion gap: 15 (ref 5–15)
BUN: 26 mg/dL — ABNORMAL HIGH (ref 8–23)
CO2: 25 mmol/L (ref 22–32)
Calcium: 8.6 mg/dL — ABNORMAL LOW (ref 8.9–10.3)
Chloride: 97 mmol/L — ABNORMAL LOW (ref 98–111)
Creatinine, Ser: 3.45 mg/dL — ABNORMAL HIGH (ref 0.44–1.00)
GFR, Estimated: 13 mL/min — ABNORMAL LOW (ref >60)
Glucose, Bld: 124 mg/dL — ABNORMAL HIGH (ref 70–99)
Phosphorus: 5.2 mg/dL — ABNORMAL HIGH (ref 2.5–4.6)
Potassium: 4.1 mmol/L (ref 3.5–5.1)
Sodium: 137 mmol/L (ref 135–145)

## 2023-12-07 MED ORDER — HEPARIN SODIUM (PORCINE) 1000 UNIT/ML DIALYSIS: 1000.0000 [IU] | INTRAMUSCULAR | Status: DC | PRN

## 2023-12-07 MED ORDER — LIDOCAINE HCL (PF) 1 % IJ SOLN: 5.0000 mL | INTRAMUSCULAR | Status: DC | PRN

## 2023-12-07 MED ORDER — LIDOCAINE-PRILOCAINE 2.5-2.5 % EX CREA
1.0000 | TOPICAL_CREAM | CUTANEOUS | Status: DC | PRN
Start: 2023-12-07 — End: 2023-12-08

## 2023-12-07 MED ORDER — ALTEPLASE 2 MG IJ SOLR: 2.0000 mg | Freq: Once | INTRAMUSCULAR | Status: DC | PRN

## 2023-12-07 MED ORDER — BUDESONIDE 0.5 MG/2ML IN SUSP
0.5000 mg | Freq: Two times a day (BID) | RESPIRATORY_TRACT | Status: DC
  Administered 2023-12-07 – 2023-12-08 (×2): 0.5 mg via RESPIRATORY_TRACT
  Filled 2023-12-07 (×2): qty 2

## 2023-12-07 MED ORDER — ARFORMOTEROL TARTRATE 15 MCG/2ML IN NEBU
15.0000 ug | INHALATION_SOLUTION | Freq: Two times a day (BID) | RESPIRATORY_TRACT | Status: DC
  Administered 2023-12-07 – 2023-12-08 (×2): 15 ug via RESPIRATORY_TRACT
  Filled 2023-12-07 (×2): qty 2

## 2023-12-07 MED ORDER — IPRATROPIUM-ALBUTEROL 0.5-2.5 (3) MG/3ML IN SOLN
3.0000 mL | Freq: Three times a day (TID) | RESPIRATORY_TRACT | Status: DC
  Administered 2023-12-07: 3 mL via RESPIRATORY_TRACT
  Filled 2023-12-07: qty 3

## 2023-12-07 MED ORDER — IPRATROPIUM-ALBUTEROL 0.5-2.5 (3) MG/3ML IN SOLN
3.0000 mL | Freq: Two times a day (BID) | RESPIRATORY_TRACT | Status: DC
  Administered 2023-12-08: 3 mL via RESPIRATORY_TRACT
  Filled 2023-12-07: qty 3

## 2023-12-07 MED ORDER — PENTAFLUOROPROP-TETRAFLUOROETH EX AERO
1.0000 | INHALATION_SPRAY | CUTANEOUS | Status: DC | PRN
Start: 2023-12-07 — End: 2023-12-08

## 2023-12-07 MED ORDER — CHLORHEXIDINE GLUCONATE CLOTH 2 % EX PADS
6.0000 | MEDICATED_PAD | Freq: Every day | CUTANEOUS | Status: DC
  Administered 2023-12-07 – 2023-12-08 (×2): 6 via TOPICAL

## 2023-12-07 MED ORDER — SODIUM CHLORIDE 0.9 % IV SOLN
200.0000 mg | INTRAVENOUS | Status: DC
  Administered 2023-12-07: 200 mg via INTRAVENOUS
  Filled 2023-12-07 (×3): qty 10

## 2023-12-07 MED ORDER — PENTAFLUOROPROP-TETRAFLUOROETH EX AERO: 1.0000 | INHALATION_SPRAY | CUTANEOUS | Status: DC | PRN

## 2023-12-07 MED ORDER — LIDOCAINE-PRILOCAINE 2.5-2.5 % EX CREA: 1.0000 | TOPICAL_CREAM | CUTANEOUS | Status: DC | PRN

## 2023-12-07 NOTE — Progress Notes (Signed)
 PROGRESS NOTE  Sheri Cooper FMW:[id number] DOB: 1950-07-08 DOA: 12/05/2023 PCP: Toribio Jerel MATSU, MD  Brief History:  73 y.o. female with medical history significant for ESRD on dialysis-Tuesday Thursday Saturday, hypertension, COPD, GI bleed.  Patient presented to the ED with complaints of difficulty breathing and productive cough that started this morning.  She has not missed any dialysis sessions.  She makes a little amount of urine.  No chest pain.  Lower extremity swelling.  No abdominal bloating.  She reports facial swelling -typically occurs when she is volume overloaded.    Patient was hospitalized and discharged same day 8/2, for volume overload secondary to ESRD, she was dialyzed and discharged home.   ED Course: Temperature 97.7.  Heart rate 79-107.  Respiratory rate 15-33.  Blood pressure elevated systolic up to 210.  O2 sats on my evaluation remained at 97% on room air.  She was placed on 2 L nasal cannula for comfort.  WBC 14.3.  BNP 802.  BUN of 56.  Potassium 4.1.  She was admitted for COPD exacerbation, DOE, volume overload.     Assessment/Plan: COPD exacerbation - admitted with increased work of breathing - Encouraged incentive spirometry, flutter valve, - add brovana  - add pulmicort  - initially on solumedrol>>prednisone  - continue duonebs   Diarrhea - resolved  - suspect from antibiotics   Acute on chronic HFpEF - 09/27/23 Echo EF 60-65%, no WMA, normal RVF - CXR personally reviewed--bilateral pleural effusions, increased interstitial markings - fluid removal via HD  - plan HD 8/7   ESRD - On hemodialysis TTS schedule,  - next HD 8/7   Hypertension - continue with home medications - hold losartan  and hydralazine  for soft BPs - continue metoprolol  and amlodipine     Anemia of chronic kidney disease - Continue with p.o. iron , IV iron 's and IV iron  per renal         Family Communication: no Family at bedside  Consultants:  renal  Code  Status:  FULL   DVT Prophylaxis:  [place] Heparin    Procedures: As Listed in Progress Note Above  Antibiotics: None       Subjective: Pt states she is breathing better, but still has some dyspnea on exertion.  Denies f/c, cp, n/v/d.  Had one loos stool in last 24h.  No hematochezia, no melena  Objective: Vitals:   12/06/23 1945 12/07/23 0455 12/07/23 0907 12/07/23 1314  BP: 113/75 92/65 117/68 (!) 81/52  Pulse: 92 87 75 65  Resp: 20 17  20   Temp: 98 F (36.7 C) 99.1 F (37.3 C)  99.2 F (37.3 C)  TempSrc: Oral Oral  Oral  SpO2: 99% 98%  99%  Weight:  42.3 kg    Height:        Intake/Output Summary (Last 24 hours) at 12/07/2023 1751 Last data filed at 12/07/2023 1300 Gross per 24 hour  Intake 1100 ml  Output 2500 ml  Net -1400 ml   Weight change: 3 kg Exam:  General:  Pt is alert, follows commands appropriately, not in acute distress HEENT: No icterus, No thrush, No neck mass, [place]/AT Cardiovascular: RRR, S1/S2, no rubs, no gallops Respiratory: bibasilar rales.  No wheeze.  Diminished BS Abdomen: Soft/+BS, non tender, non distended, no guarding Extremities: No edema, No lymphangitis, No petechiae, No rashes, no synovitis   Data Reviewed: I have personally reviewed following labs and imaging studies Basic Metabolic Panel: Recent Labs  Lab 12/03/23 0319 12/03/23 0553  12/05/23 2038 12/06/23 0452 12/06/23 0519 12/07/23 0541  NA 143 142 141 140  --  137  K 4.1 3.8 4.1 4.6  --  4.1  CL 104 106 106 105  --  97*  CO2 24 21* 21* 19*  --  25  GLUCOSE 87 124* 111* 123*  --  124*  BUN 29* 29* 56* 60*  --  26*  CREATININE 6.75* 6.81* 6.85* 7.05*  --  3.45*  CALCIUM  9.1 8.8* 8.6* 8.8*  --  8.6*  MG  --   --  2.5*  --   --   --   PHOS  --   --   --   --  2.9 5.2*   Liver Function Tests: Recent Labs  Lab 12/03/23 0319 12/05/23 2038 12/07/23 0541  AST 14* 17  --   ALT 6 8  --   ALKPHOS 89 95  --   BILITOT 0.7 0.7  --   PROT 6.8 6.7  --   ALBUMIN  3.1* 3.1* 3.8    No results for input(s): LIPASE, AMYLASE in the last 168 hours. No results for input(s): AMMONIA in the last 168 hours. Coagulation Profile: No results for input(s): INR, PROTIME in the last 168 hours. CBC: Recent Labs  Lab 12/03/23 0319 12/03/23 0553 12/05/23 2038 12/06/23 0452 12/07/23 0541  WBC 11.9* 13.6* 14.3* 11.9* 13.4*  NEUTROABS 8.5*  --   --   --  12.0*  HGB 8.8* 8.9* 8.5* 8.4* 8.3*  HCT 27.8* 28.2* 27.0* 26.1* 25.8*  MCV 85.8 86.5 87.7 87.0 84.0  PLT 356 350 420* 416* 422*   Cardiac Enzymes: No results for input(s): CKTOTAL, CKMB, CKMBINDEX, TROPONINI in the last 168 hours. BNP: Invalid input(s): POCBNP CBG: No results for input(s): GLUCAP in the last 168 hours. HbA1C: No results for input(s): HGBA1C in the last 72 hours. Urine analysis:    Component Value Date/Time   COLORURINE YELLOW 05/28/2022 1005   APPEARANCEUR CLOUDY (A) 05/28/2022 1005   LABSPEC 1.018 05/28/2022 1005   PHURINE 7.0 05/28/2022 1005   GLUCOSEU NEGATIVE 05/28/2022 1005   HGBUR SMALL (A) 05/28/2022 1005   BILIRUBINUR NEGATIVE 05/28/2022 1005   KETONESUR NEGATIVE 05/28/2022 1005   PROTEINUR >=300 (A) 05/28/2022 1005   NITRITE NEGATIVE 05/28/2022 1005   LEUKOCYTESUR MODERATE (A) 05/28/2022 1005   Sepsis Labs: @LABRCNTIP (procalcitonin:4,lacticidven:4) ) Recent Results (from the past 240 hours)  MRSA Next Gen by PCR, Nasal     Status: None   Collection Time: 12/03/23  4:20 AM   Specimen: Nasal Mucosa; Nasal Swab  Result Value Ref Range Status   MRSA by PCR Next Gen NOT DETECTED NOT DETECTED Final    Comment: (NOTE) The GeneXpert MRSA Assay (FDA approved for NASAL specimens only), is one component of a comprehensive MRSA colonization surveillance program. It is not intended to diagnose MRSA infection nor to guide or monitor treatment for MRSA infections. Test performance is not FDA approved in patients less than 42 years old. Performed at [facility], [address] [place], [place] [address]   Resp panel by RT-PCR (RSV, Flu A&B, Covid) Anterior Nasal Swab     Status: None   Collection Time: 12/05/23  8:18 PM   Specimen: Anterior Nasal Swab  Result Value Ref Range Status   SARS Coronavirus 2 by RT PCR NEGATIVE NEGATIVE Final    Comment: (NOTE) SARS-CoV-2 target nucleic acids are NOT DETECTED.  The SARS-CoV-2 RNA is generally detectable in upper respiratory specimens during the acute  phase of infection. The lowest concentration of SARS-CoV-2 viral copies this assay can detect is 138 copies/mL. A negative result does not preclude SARS-Cov-2 infection and should not be used as the sole basis for treatment or other patient management decisions. A negative result may occur with  improper specimen collection/handling, submission of specimen other than nasopharyngeal swab, presence of viral mutation(s) within the areas targeted by this assay, and inadequate number of viral copies(<138 copies/mL). A negative result must be combined with clinical observations, patient history, and epidemiological information. The expected result is Negative.  Fact Sheet for Patients:  BloggerCourse.com  Fact Sheet for Healthcare Providers:  SeriousBroker.it  This test is no t yet approved or cleared by the [place]  FDA and  has been authorized for detection and/or diagnosis of SARS-CoV-2 by FDA under an Emergency Use Authorization (EUA). This EUA will remain  in effect (meaning this test can be used) for the duration of the COVID-19 declaration under Section 564(b)(1) of the Act, 21 U.S.C.section 360bbb-3(b)(1), unless the authorization is terminated  or revoked sooner.       Influenza A by PCR NEGATIVE NEGATIVE Final   Influenza B by PCR NEGATIVE NEGATIVE Final    Comment: (NOTE) The Xpert Xpress SARS-CoV-2/FLU/RSV plus assay is intended as an aid in the diagnosis of influenza from  Nasopharyngeal swab specimens and should not be used as a sole basis for treatment. Nasal washings and aspirates are unacceptable for Xpert Xpress SARS-CoV-2/FLU/RSV testing.  Fact Sheet for Patients: BloggerCourse.com  Fact Sheet for Healthcare Providers: SeriousBroker.it  This test is not yet approved or cleared by the [place]  FDA and has been authorized for detection and/or diagnosis of SARS-CoV-2 by FDA under an Emergency Use Authorization (EUA). This EUA will remain in effect (meaning this test can be used) for the duration of the COVID-19 declaration under Section 564(b)(1) of the Act, 21 U.S.C. section 360bbb-3(b)(1), unless the authorization is terminated or revoked.     Resp Syncytial Virus by PCR NEGATIVE NEGATIVE Final    Comment: (NOTE) Fact Sheet for Patients: BloggerCourse.com  Fact Sheet for Healthcare Providers: SeriousBroker.it  This test is not yet approved or cleared by the [place]  FDA and has been authorized for detection and/or diagnosis of SARS-CoV-2 by FDA under an Emergency Use Authorization (EUA). This EUA will remain in effect (meaning this test can be used) for the duration of the COVID-19 declaration under Section 564(b)(1) of the Act, 21 U.S.C. section 360bbb-3(b)(1), unless the authorization is terminated or revoked.  Performed at [facility], [address] [place], [place] [address]      Scheduled Meds:  amLODipine   10 mg Oral Daily   arformoterol   15 mcg Nebulization BID   budesonide  (PULMICORT ) nebulizer solution  0.5 mg Nebulization BID   Chlorhexidine  Gluconate Cloth  6 each Topical Daily   Chlorhexidine  Gluconate Cloth  6 each Topical Q0600   Chlorhexidine  Gluconate Cloth  6 each Topical Q0600   heparin   5,000 Units Subcutaneous Q8H   ipratropium-albuterol   3 mL Nebulization Q8H   metoprolol  succinate  50 mg Oral Daily    predniSONE   40 mg Oral Q breakfast   sevelamer  carbonate  2,400 mg Oral TID WC   Continuous Infusions:  doxycycline  (VIBRAMYCIN ) IV 100 mg (12/07/23 1211)   iron  sucrose 200 mg (12/07/23 1503)    Procedures/Studies: DG Chest Port 1 View Result Date: 12/05/2023 CLINICAL DATA:  Shortness of breath. EXAM: PORTABLE CHEST 1 VIEW COMPARISON:  Radiograph 2 days ago 12/03/2023  FINDINGS: Again seen cardiomegaly. Moderate pleural effusions are unchanged from recent exam. Again seen diffuse interstitial thickening suspicious for pulmonary edema. Bibasilar opacities are unchanged. No pneumothorax. Left chest wall/axillary surgical clips. IMPRESSION: 1. Findings again suggestive of CHF. Unchanged cardiomegaly, moderate pleural effusions, and diffuse interstitial thickening suspicious for pulmonary edema. 2. Bibasilar opacities are unchanged. Electronically Signed   By: Andrea Gasman M.D.   On: 12/05/2023 21:18   DG Chest Portable 1 View Result Date: 12/03/2023 EXAM: 1 VIEW XRAY OF THE CHEST 12/03/2023 03:18:00 AM COMPARISON: 09/26/2023 CLINICAL HISTORY: Dyspnea. FINDINGS: LUNGS AND PLEURA: Moderate bilateral pleural effusions. Pulmonary vascular congestion. Interstitial edema. Bibasilar airspace opacities. No pneumothorax. HEART AND MEDIASTINUM: Stable cardiomegaly. Aortic atherosclerotic calcification. BONES AND SOFT TISSUES: No acute osseous abnormality. IMPRESSION: 1. Findings most suggestive of CHF with moderate bilateral pleural effusions similar to prior. Multifocal pneumonia could appear similarly. Electronically signed by: Norman Gatlin MD 12/03/2023 03:26 AM EDT RP Workstation: [id number]    Alm Schneider, DO  [facility]  If 7PM-7AM, please contact night-coverage [email or web address] Password [email or web address] 12/07/2023, 5:51 PM   LOS: 1 day

## 2023-12-07 NOTE — Plan of Care (Signed)

## 2023-12-07 NOTE — Progress Notes (Signed)
 Sheri Cooper is an 73 y.o. female COPD, hypertension, GI bleed, ESRD here with shortness of breath and productive cough.  Noted to be hypertensive with systolics in the 210 range in the ED with a white count of 14.3.    Assessment/Plan: # ESRD: HD today on TTS schedule. Dialysis prescription adjusted based on labs.  Tolerated dialysis on Tuesday with 2.5 L net UF; patient feels much better with markedly improved shortness of breath.  Next treatment on Thursday if she still here.   # Volume/ hypertension: frequent problems with volume overload.  Dialysis with ultrafiltration today.continue home blood pressure medications.  Shortness of breath as below   # Anemia of Chronic Kidney Disease:hemoglobin in the eights.  8% TSAT F274. Could consider ESA; Hb 8.5 (stable) Will load with IV iron    # Secondary Hyperparathyroidism/Hyperphosphatemia: calcium  corrects to normal.  Obtain phosphorus level.continue home sevelamer .   # Vascular access: lt AVG with no issues   # shortness of breath: volume overload contributing but also being treated for COPD exacerbation. Recent echo 09/2023 EF of 60 to 65%.  Facial swelling otherwise no peripheral signs of edema; only had the right IJ TDC in for about 4 months so less likely to be SVC syndrome.  Chest x-rays with finding of moderate pleural effusions, pulmonary edema. Likely multifactorial.  She does not have a pulmonologist.  Recommend referral to pulmonology outpatient.     # COPD   # Additional recommendations: - Dose all meds for creatinine clearance < 10 ml/min  - Unless absolutely necessary, no MRIs with gadolinium.  - Implement save arm precautions.  Prefer needle sticks in the dorsum of the hands or wrists.  No blood pressure measurements in arm. - If blood transfusion is requested during hemodialysis sessions, please alert us  prior to the session.  - Use synthetic opioids (Fentanyl /Dilaudid ) if needed   Subjective: Feeling much better  after treatment yesterday.   Chemistry and CBC: Creatinine, Ser  Date/Time Value Ref Range Status  12/07/2023 05:41 AM 3.45 (H) 0.44 - 1.00 mg/dL Final    Comment:    DELTA CHECK NOTED  12/06/2023 04:52 AM 7.05 (H) 0.44 - 1.00 mg/dL Final  91/95/7974 91:61 PM 6.85 (H) 0.44 - 1.00 mg/dL Final  91/97/7974 94:46 AM 6.81 (H) 0.44 - 1.00 mg/dL Final  91/97/7974 96:80 AM 6.75 (H) 0.44 - 1.00 mg/dL Final  94/72/7974 97:57 AM 6.29 (H) 0.44 - 1.00 mg/dL Final  94/73/7974 89:49 PM 6.60 (H) 0.44 - 1.00 mg/dL Final  94/85/7974 95:77 AM 5.56 (H) 0.44 - 1.00 mg/dL Final    Comment:    DELTA CHECK NOTED  09/13/2023 03:56 AM 3.58 (H) 0.44 - 1.00 mg/dL Final  94/87/7974 95:99 AM 5.93 (H) 0.44 - 1.00 mg/dL Final  94/88/7974 95:92 AM 4.29 (H) 0.44 - 1.00 mg/dL Final  94/89/7974 96:60 AM 7.06 (H) 0.44 - 1.00 mg/dL Final  94/90/7974 96:55 AM 5.89 (H) 0.44 - 1.00 mg/dL Final  94/91/7974 87:77 PM 5.15 (H) 0.44 - 1.00 mg/dL Final  87/69/7975 94:48 AM 7.30 (H) 0.44 - 1.00 mg/dL Final  89/96/7975 97:88 AM 4.83 (H) 0.44 - 1.00 mg/dL Final  90/73/7975 95:77 AM 5.23 (H) 0.44 - 1.00 mg/dL Final    Comment:    DELTA CHECK NOTED  01/26/2023 04:32 AM 3.46 (H) 0.44 - 1.00 mg/dL Final  90/75/7975 89:99 AM 5.78 (H) 0.44 - 1.00 mg/dL Final  92/70/7975 88:41 AM 5.70 (H) 0.44 - 1.00 mg/dL Final  94/90/7975 95:77 AM 3.56 (H) 0.44 -  1.00 mg/dL Final    Comment:    DELTA CHECK NOTED  09/08/2022 04:47 AM 1.82 (H) 0.44 - 1.00 mg/dL Final    Comment:    DELTA CHECK NOTED  09/07/2022 09:00 AM 3.80 (H) 0.44 - 1.00 mg/dL Final  95/83/7975 95:48 PM 1.50 (H) 0.44 - 1.00 mg/dL Final  97/96/7975 95:66 AM 2.88 (H) 0.44 - 1.00 mg/dL Final  97/97/7975 96:45 AM 2.31 (H) 0.44 - 1.00 mg/dL Final  97/98/7975 96:43 AM 1.72 (H) 0.44 - 1.00 mg/dL Final  97/98/7975 96:44 AM 1.68 (H) 0.44 - 1.00 mg/dL Final  98/68/7975 95:92 AM 2.13 (H) 0.44 - 1.00 mg/dL Final  98/68/7975 95:92 AM 2.20 (H) 0.44 - 1.00 mg/dL Final  98/69/7975 95:95 AM  1.70 (H) 0.44 - 1.00 mg/dL Final  98/70/7975 94:84 AM 2.34 (H) 0.44 - 1.00 mg/dL Final  98/71/7975 94:99 AM 1.88 (H) 0.44 - 1.00 mg/dL Final  98/72/7975 94:91 AM 2.55 (H) 0.44 - 1.00 mg/dL Final  98/73/7975 91:96 AM 1.71 (H) 0.44 - 1.00 mg/dL Final  98/73/7975 92:85 AM 1.76 (H) 0.44 - 1.00 mg/dL Final    Comment:    DELTA CHECK NOTED  05/27/2022 04:20 AM 2.98 (H) 0.44 - 1.00 mg/dL Final  98/75/7975 95:89 AM 2.00 (H) 0.44 - 1.00 mg/dL Final    Comment:    DELTA CHECK NOTED  05/25/2022 04:04 AM 3.10 (H) 0.44 - 1.00 mg/dL Final  98/77/7975 95:60 AM 2.54 (H) 0.44 - 1.00 mg/dL Final  98/78/7975 95:85 AM 3.54 (H) 0.44 - 1.00 mg/dL Final  98/79/7975 96:53 AM 2.73 (H) 0.44 - 1.00 mg/dL Final  98/80/7975 95:43 AM 4.03 (H) 0.44 - 1.00 mg/dL Final  98/81/7975 94:98 AM 5.92 (H) 0.44 - 1.00 mg/dL Final  98/82/7975 95:66 AM 5.57 (H) 0.44 - 1.00 mg/dL Final  98/83/7975 96:99 AM 8.12 (H) 0.44 - 1.00 mg/dL Final  98/84/7975 95:97 AM 7.51 (H) 0.44 - 1.00 mg/dL Final  98/85/7975 96:90 AM 7.50 (H) 0.44 - 1.00 mg/dL Final   Recent Labs  Lab 12/03/23 0319 12/03/23 0553 12/05/23 2038 12/06/23 0452 12/06/23 0519 12/07/23 0541  NA 143 142 141 140  --  137  K 4.1 3.8 4.1 4.6  --  4.1  CL 104 106 106 105  --  97*  CO2 24 21* 21* 19*  --  25  GLUCOSE 87 124* 111* 123*  --  124*  BUN 29* 29* 56* 60*  --  26*  CREATININE 6.75* 6.81* 6.85* 7.05*  --  3.45*  CALCIUM  9.1 8.8* 8.6* 8.8*  --  8.6*  PHOS  --   --   --   --  2.9 5.2*   Recent Labs  Lab 12/03/23 0319 12/03/23 0553 12/05/23 2038 12/06/23 0452 12/07/23 0541  WBC 11.9* 13.6* 14.3* 11.9* 13.4*  NEUTROABS 8.5*  --   --   --  12.0*  HGB 8.8* 8.9* 8.5* 8.4* 8.3*  HCT 27.8* 28.2* 27.0* 26.1* 25.8*  MCV 85.8 86.5 87.7 87.0 84.0  PLT 356 350 420* 416* 422*   Liver Function Tests: Recent Labs  Lab 12/03/23 0319 12/05/23 2038 12/07/23 0541  AST 14* 17  --   ALT 6 8  --   ALKPHOS 89 95  --   BILITOT 0.7 0.7  --   PROT 6.8 6.7  --    ALBUMIN  3.1* 3.1* 3.8   No results for input(s): LIPASE, AMYLASE in the last 168 hours. No results for input(s): AMMONIA in the last 168 hours. Cardiac Enzymes:  No results for input(s): CKTOTAL, CKMB, CKMBINDEX, TROPONINI in the last 168 hours. Iron  Studies:  Recent Labs    12/06/23 0519  IRON  19*  TIBC 235*  FERRITIN 274   PT/INR: @LABRCNTIP (inr:5)  Xrays/Other Studies: ) Results for orders placed or performed during the hospital encounter of 12/05/23 (from the past 48 hours)  Resp panel by RT-PCR (RSV, Flu A&B, Covid) Anterior Nasal Swab     Status: None   Collection Time: 12/05/23  8:18 PM   Specimen: Anterior Nasal Swab  Result Value Ref Range   SARS Coronavirus 2 by RT PCR NEGATIVE NEGATIVE    Comment: (NOTE) SARS-CoV-2 target nucleic acids are NOT DETECTED.  The SARS-CoV-2 RNA is generally detectable in upper respiratory specimens during the acute phase of infection. The lowest concentration of SARS-CoV-2 viral copies this assay can detect is 138 copies/mL. A negative result does not preclude SARS-Cov-2 infection and should not be used as the sole basis for treatment or other patient management decisions. A negative result may occur with  improper specimen collection/handling, submission of specimen other than nasopharyngeal swab, presence of viral mutation(s) within the areas targeted by this assay, and inadequate number of viral copies(<138 copies/mL). A negative result must be combined with clinical observations, patient history, and epidemiological information. The expected result is Negative.  Fact Sheet for Patients:  BloggerCourse.com  Fact Sheet for Healthcare Providers:  SeriousBroker.it  This test is no t yet approved or cleared by the [place]  FDA and  has been authorized for detection and/or diagnosis of SARS-CoV-2 by FDA under an Emergency Use Authorization (EUA). This EUA will  remain  in effect (meaning this test can be used) for the duration of the COVID-19 declaration under Section 564(b)(1) of the Act, 21 U.S.C.section 360bbb-3(b)(1), unless the authorization is terminated  or revoked sooner.       Influenza A by PCR NEGATIVE NEGATIVE   Influenza B by PCR NEGATIVE NEGATIVE    Comment: (NOTE) The Xpert Xpress SARS-CoV-2/FLU/RSV plus assay is intended as an aid in the diagnosis of influenza from Nasopharyngeal swab specimens and should not be used as a sole basis for treatment. Nasal washings and aspirates are unacceptable for Xpert Xpress SARS-CoV-2/FLU/RSV testing.  Fact Sheet for Patients: BloggerCourse.com  Fact Sheet for Healthcare Providers: SeriousBroker.it  This test is not yet approved or cleared by the [place]  FDA and has been authorized for detection and/or diagnosis of SARS-CoV-2 by FDA under an Emergency Use Authorization (EUA). This EUA will remain in effect (meaning this test can be used) for the duration of the COVID-19 declaration under Section 564(b)(1) of the Act, 21 U.S.C. section 360bbb-3(b)(1), unless the authorization is terminated or revoked.     Resp Syncytial Virus by PCR NEGATIVE NEGATIVE    Comment: (NOTE) Fact Sheet for Patients: BloggerCourse.com  Fact Sheet for Healthcare Providers: SeriousBroker.it  This test is not yet approved or cleared by the [place]  FDA and has been authorized for detection and/or diagnosis of SARS-CoV-2 by FDA under an Emergency Use Authorization (EUA). This EUA will remain in effect (meaning this test can be used) for the duration of the COVID-19 declaration under Section 564(b)(1) of the Act, 21 U.S.C. section 360bbb-3(b)(1), unless the authorization is terminated or revoked.  Performed at [facility], [address], [place], [place] [address]   Comprehensive metabolic  panel     Status: Abnormal   Collection Time: 12/05/23  8:38 PM  Result Value Ref Range   Sodium 141 135 -  145 mmol/L   Potassium 4.1 3.5 - 5.1 mmol/L   Chloride 106 98 - 111 mmol/L   CO2 21 (L) 22 - 32 mmol/L   Glucose, Bld 111 (H) 70 - 99 mg/dL    Comment: Glucose reference range applies only to samples taken after fasting for at least 8 hours.   BUN 56 (H) 8 - 23 mg/dL   Creatinine, Ser 3.14 (H) 0.44 - 1.00 mg/dL   Calcium  8.6 (L) 8.9 - 10.3 mg/dL   Total Protein 6.7 6.5 - 8.1 g/dL   Albumin  3.1 (L) 3.5 - 5.0 g/dL   AST 17 15 - 41 U/L   ALT 8 0 - 44 U/L   Alkaline Phosphatase 95 38 - 126 U/L   Total Bilirubin 0.7 0.0 - 1.2 mg/dL   GFR, Estimated 6 (L) >60 mL/min    Comment: (NOTE) Calculated using the CKD-EPI Creatinine Equation (2021)    Anion gap 14 5 - 15    Comment: Performed at [facility], [address], [place], [place] [address]  CBC     Status: Abnormal   Collection Time: 12/05/23  8:38 PM  Result Value Ref Range   WBC 14.3 (H) 4.0 - 10.5 K/uL   RBC 3.08 (L) 3.87 - 5.11 MIL/uL   Hemoglobin 8.5 (L) 12.0 - 15.0 g/dL   HCT 72.9 (L) 63.9 - 53.9 %   MCV 87.7 80.0 - 100.0 fL   MCH 27.6 26.0 - 34.0 pg   MCHC 31.5 30.0 - 36.0 g/dL   RDW 80.4 (H) 88.4 - 84.4 %   Platelets 420 (H) 150 - 400 K/uL   nRBC 0.0 0.0 - 0.2 %    Comment: Performed at [facility], [address] [place], [place] [address]  Brain natriuretic peptide     Status: Abnormal   Collection Time: 12/05/23  8:38 PM  Result Value Ref Range   B Natriuretic Peptide 802.0 (H) 0.0 - 100.0 pg/mL    Comment: Performed at [facility], [address], [place], [place] [address]  Magnesium      Status: Abnormal   Collection Time: 12/05/23  8:38 PM  Result Value Ref Range   Magnesium  2.5 (H) 1.7 - 2.4 mg/dL    Comment: Performed at [facility], [address] [place], [place] [address]  Basic metabolic panel     Status: Abnormal   Collection Time: 12/06/23  4:52 AM  Result Value Ref Range   Sodium 140 135 -  145 mmol/L   Potassium 4.6 3.5 - 5.1 mmol/L   Chloride 105 98 - 111 mmol/L   CO2 19 (L) 22 - 32 mmol/L   Glucose, Bld 123 (H) 70 - 99 mg/dL    Comment: Glucose reference range applies only to samples taken after fasting for at least 8 hours.   BUN 60 (H) 8 - 23 mg/dL   Creatinine, Ser 2.94 (H) 0.44 - 1.00 mg/dL   Calcium  8.8 (L) 8.9 - 10.3 mg/dL   GFR, Estimated 6 (L) >60 mL/min    Comment: (NOTE) Calculated using the CKD-EPI Creatinine Equation (2021)    Anion gap 16 (H) 5 - 15    Comment: Performed at [facility], [address] [place], [place] [address]  CBC     Status: Abnormal   Collection Time: 12/06/23  4:52 AM  Result Value Ref Range   WBC 11.9 (H) 4.0 - 10.5 K/uL   RBC 3.00 (L) 3.87 - 5.11 MIL/uL   Hemoglobin 8.4 (L) 12.0 -  15.0 g/dL   HCT 73.8 (L) 63.9 - 53.9 %   MCV 87.0 80.0 - 100.0 fL   MCH 28.0 26.0 - 34.0 pg   MCHC 32.2 30.0 - 36.0 g/dL   RDW 80.3 (H) 88.4 - 84.4 %   Platelets 416 (H) 150 - 400 K/uL   nRBC 0.0 0.0 - 0.2 %    Comment: Performed at [facility], [address], [place], [place] [address]  Iron  and TIBC     Status: Abnormal   Collection Time: 12/06/23  5:19 AM  Result Value Ref Range   Iron  19 (L) 28 - 170 ug/dL   TIBC 764 (L) 749 - 549 ug/dL   Saturation Ratios 8 (L) 10.4 - 31.8 %   UIBC 216 ug/dL    Comment: Performed at [facility], [address] [place], [place] [address]  Ferritin     Status: None   Collection Time: 12/06/23  5:19 AM  Result Value Ref Range   Ferritin 274 11 - 307 ng/mL    Comment: Performed at [facility], [address], [place], [place] [address]  Phosphorus     Status: None   Collection Time: 12/06/23  5:19 AM  Result Value Ref Range   Phosphorus 2.9 2.5 - 4.6 mg/dL    Comment: Performed at [facility], [address] [place], [place] [address]  Renal function panel     Status: Abnormal   Collection Time: 12/07/23  5:41 AM  Result Value Ref Range   Sodium 137 135 - 145 mmol/L   Potassium 4.1 3.5 - 5.1 mmol/L    Chloride 97 (L) 98 - 111 mmol/L   CO2 25 22 - 32 mmol/L   Glucose, Bld 124 (H) 70 - 99 mg/dL    Comment: Glucose reference range applies only to samples taken after fasting for at least 8 hours.   BUN 26 (H) 8 - 23 mg/dL   Creatinine, Ser 6.54 (H) 0.44 - 1.00 mg/dL    Comment: DELTA CHECK NOTED   Calcium  8.6 (L) 8.9 - 10.3 mg/dL   Phosphorus 5.2 (H) 2.5 - 4.6 mg/dL   Albumin  3.8 3.5 - 5.0 g/dL   GFR, Estimated 13 (L) >60 mL/min    Comment: (NOTE) Calculated using the CKD-EPI Creatinine Equation (2021)    Anion gap 15 5 - 15    Comment: Performed at [facility], [address], [place], [place] [address]  CBC with Differential/Platelet     Status: Abnormal   Collection Time: 12/07/23  5:41 AM  Result Value Ref Range   WBC 13.4 (H) 4.0 - 10.5 K/uL   RBC 3.07 (L) 3.87 - 5.11 MIL/uL   Hemoglobin 8.3 (L) 12.0 - 15.0 g/dL   HCT 74.1 (L) 63.9 - 53.9 %   MCV 84.0 80.0 - 100.0 fL   MCH 27.0 26.0 - 34.0 pg   MCHC 32.2 30.0 - 36.0 g/dL   RDW 79.9 (H) 88.4 - 84.4 %   Platelets 422 (H) 150 - 400 K/uL   nRBC 0.0 0.0 - 0.2 %   Neutrophils Relative % 90 %   Neutro Abs 12.0 (H) 1.7 - 7.7 K/uL   Lymphocytes Relative 7 %   Lymphs Abs 0.9 0.7 - 4.0 K/uL   Monocytes Relative 2 %   Monocytes Absolute 0.3 0.1 - 1.0 K/uL   Eosinophils Relative 0 %   Eosinophils Absolute 0.0 0.0 - 0.5 K/uL   Basophils Relative 0 %   Basophils Absolute 0.0 0.0 -  0.1 K/uL   Immature Granulocytes 1 %   Abs Immature Granulocytes 0.13 (H) 0.00 - 0.07 K/uL    Comment: Performed at [facility], [address], [place], [place] [address]   DG Chest Port 1 View Result Date: 12/05/2023 CLINICAL DATA:  Shortness of breath. EXAM: PORTABLE CHEST 1 VIEW COMPARISON:  Radiograph 2 days ago 12/03/2023 FINDINGS: Again seen cardiomegaly. Moderate pleural effusions are unchanged from recent exam. Again seen diffuse interstitial thickening suspicious for pulmonary edema. Bibasilar opacities are unchanged. No pneumothorax. Left chest  wall/axillary surgical clips. IMPRESSION: 1. Findings again suggestive of CHF. Unchanged cardiomegaly, moderate pleural effusions, and diffuse interstitial thickening suspicious for pulmonary edema. 2. Bibasilar opacities are unchanged. Electronically Signed   By: Andrea Gasman M.D.   On: 12/05/2023 21:18    PMH:   Past Medical History:  Diagnosis Date   Anemia    Chronic kidney disease (CKD), stage IV (severe) (HCC)    COPD (chronic obstructive pulmonary disease) (HCC)    ESRD (end stage renal disease) (HCC)    Hemodialysis T, TH, SAT, [address] [place]   GERD (gastroesophageal reflux disease)    History of blood transfusion    History of blood transfusion    Hypertension    Pneumonia    3 times in 2024-   PUD (peptic ulcer disease) 05/2022   Rheumatoid arthritis (HCC)    Rheumatoid arthritis (HCC)     PSH:   Past Surgical History:  Procedure Laterality Date   A/V SHUNTOGRAM N/A 05/02/2023   Procedure: A/V SHUNTOGRAM;  Surgeon: Pearline Norman RAMAN, MD;  Location: [facility] INVASIVE CV LAB;  Service: Cardiovascular;  Laterality: N/A;   AV FISTULA PLACEMENT Left 11/29/2022   Procedure: INSERTION OF LEFT UPPER  ARTERIOVENOUS (AV) GORE-TEX GRAFT ARM;  Surgeon: Lanis Fonda BRAVO, MD;  Location: [facility] OR;  Service: Vascular;  Laterality: Left;   CENTRAL VENOUS CATHETER INSERTION Left 05/27/2022   Procedure: INSERTION CENTRAL LINE ADULT;  Surgeon: Kallie Manuelita BROCKS, MD;  Location: [facility] ORS;  Service: General;  Laterality: Left;   COLONOSCOPY WITH PROPOFOL  N/A 05/22/2022   Surgeon: Eartha Flavors, Toribio, MD; blood in the terminal ileum and colon.   ESOPHAGOGASTRODUODENOSCOPY (EGD) WITH PROPOFOL  N/A 05/22/2022   Surgeon: Eartha Flavors, Toribio, MD; peptic ulcer disease with 2 nonbleeding cratered gastric ulcers, multiple duodenal ulcers, 2 of which had adherent clots that were removed and revealed 2 vessels.  1 vessel was clipped, cauterized, and treated with epinephrine  injection.  Second vessel  was clipped and treated with cautery, clip fell off.   IR FLUORO GUIDE CV LINE RIGHT  05/24/2022   IR US  GUIDE VASC ACCESS RIGHT  05/24/2022   LAPAROTOMY N/A 05/27/2022   Procedure: EXPLORATORY LAPAROTOMY, OMENTAL PATH, DRAIN PLACEMENT;  Surgeon: Kallie Manuelita BROCKS, MD;  Location: [facility] ORS;  Service: General;  Laterality: N/A;   PERIPHERAL VASCULAR BALLOON ANGIOPLASTY  05/02/2023   Procedure: PERIPHERAL VASCULAR BALLOON ANGIOPLASTY;  Surgeon: Pearline Norman RAMAN, MD;  Location: [facility] INVASIVE CV LAB;  Service: Cardiovascular;;   PERIPHERAL VASCULAR THROMBECTOMY Left 08/18/2023   Procedure: PERIPHERAL VASCULAR THROMBECTOMY;  Surgeon: Melia Lynwood ORN, MD;  Location: [facility] INVASIVE CV LAB;  Service: Cardiovascular;  Laterality: Left;   VENOUS ANGIOPLASTY Left 08/18/2023   Procedure: VENOUS ANGIOPLASTY;  Surgeon: Melia Lynwood ORN, MD;  Location: [facility] INVASIVE CV LAB;  Service: Cardiovascular;  Laterality: Left;  80% Intragraft; 50% Venous Anastomosis   VOLVULUS REDUCTION     colon resection    Allergies: No Known Allergies  Medications:   Prior to Admission medications   Medication Sig Start Date End Date Taking? Authorizing Provider  albuterol  (VENTOLIN  HFA) 108 (90 Base) MCG/ACT inhaler Inhale 2 puffs into the lungs every 6 (six) hours as needed for wheezing or shortness of breath. 09/28/23  Yes Maree, Pratik D, DO  amLODipine  (NORVASC ) 10 MG tablet Take 10 mg by mouth daily.   Yes [provider]  ferrous sulfate  325 (65 FE) MG EC tablet Take 325 mg by mouth 3 (three) times a week. Tuesday, Thursday, and Saturday at dialysis   Yes [provider]  hydrALAZINE  (APRESOLINE ) 50 MG tablet Take 1 tablet (50 mg total) by mouth 2 (two) times daily. 09/15/23  Yes Tat, Alm, MD  losartan  (COZAAR ) 50 MG tablet Take 1 tablet (50 mg total) by mouth daily. 09/16/23  Yes Tat, Alm, MD  metoprolol  succinate (TOPROL -XL) 50 MG 24 hr tablet Take 50 mg by mouth daily. 10/15/22  Yes [provider]  sevelamer   carbonate (RENVELA ) 800 MG tablet Take 2,400 mg by mouth 3 (three) times daily with meals.   Yes [provider]    Discontinued Meds:   Medications Discontinued During This Encounter  Medication Reason   b complex-vitamin c-folic acid  (NEPHRO-VITE) 0.8 MG TABS tablet Patient Preference   pantoprazole  (PROTONIX ) 40 MG tablet Patient Preference   hydrALAZINE  (APRESOLINE ) tablet 25 mg     Social History:  reports that she has quit smoking. Her smoking use included cigarettes. She has been exposed to tobacco smoke. She has never used smokeless tobacco. She reports that she does not currently use alcohol. She reports that she does not use drugs.  Family History:  History reviewed. No pertinent family history.  Blood pressure 117/68, pulse 75, temperature 99.1 F (37.3 C), temperature source Oral, resp. rate 17, height 4' 11 (1.499 m), weight 42.3 kg, SpO2 98%. Physical Exam: General: well-appearing, NAD HEENT: NCAT CV: normal rate, no rub, no lower extremity edema Lungs: bilateral chest rise, normal RR Abd: SNDNT +BS Skin: no visible lesions or rashes Psych: alert, engaged, appropriate mood and affect Neuro: normal speech, no gross focal deficits  Access: LUA AVG + pulse    Shimon Trowbridge, LYNWOOD ORN, MD 12/07/2023, 10:12 AM

## 2023-12-08 DIAGNOSIS — I5033 Acute on chronic diastolic (congestive) heart failure: Secondary | ICD-10-CM | POA: Diagnosis not present

## 2023-12-08 DIAGNOSIS — N186 End stage renal disease: Secondary | ICD-10-CM | POA: Diagnosis not present

## 2023-12-08 DIAGNOSIS — J441 Chronic obstructive pulmonary disease with (acute) exacerbation: Secondary | ICD-10-CM | POA: Diagnosis not present

## 2023-12-08 LAB — CBC WITH DIFFERENTIAL/PLATELET
Abs Immature Granulocytes: 0.13 K/uL — ABNORMAL HIGH (ref 0.00–0.07)
Basophils Absolute: 0 K/uL (ref 0.0–0.1)
Basophils Relative: 0 %
Eosinophils Absolute: 0 K/uL (ref 0.0–0.5)
Eosinophils Relative: 0 %
HCT: 26.4 % — ABNORMAL LOW (ref 36.0–46.0)
Hemoglobin: 8.6 g/dL — ABNORMAL LOW (ref 12.0–15.0)
Immature Granulocytes: 1 %
Lymphocytes Relative: 13 %
Lymphs Abs: 1.6 K/uL (ref 0.7–4.0)
MCH: 28 pg (ref 26.0–34.0)
MCHC: 32.6 g/dL (ref 30.0–36.0)
MCV: 86 fL (ref 80.0–100.0)
Monocytes Absolute: 0.8 K/uL (ref 0.1–1.0)
Monocytes Relative: 7 %
Neutro Abs: 9.5 K/uL — ABNORMAL HIGH (ref 1.7–7.7)
Neutrophils Relative %: 79 %
Platelets: 429 K/uL — ABNORMAL HIGH (ref 150–400)
RBC: 3.07 MIL/uL — ABNORMAL LOW (ref 3.87–5.11)
RDW: 19.9 % — ABNORMAL HIGH (ref 11.5–15.5)
WBC: 12.1 K/uL — ABNORMAL HIGH (ref 4.0–10.5)
nRBC: 0 % (ref 0.0–0.2)

## 2023-12-08 LAB — RENAL FUNCTION PANEL
Albumin: 3.7 g/dL (ref 3.5–5.0)
Anion gap: 15 (ref 5–15)
BUN: 55 mg/dL — ABNORMAL HIGH (ref 8–23)
CO2: 25 mmol/L (ref 22–32)
Calcium: 8 mg/dL — ABNORMAL LOW (ref 8.9–10.3)
Chloride: 94 mmol/L — ABNORMAL LOW (ref 98–111)
Creatinine, Ser: 5.45 mg/dL — ABNORMAL HIGH (ref 0.44–1.00)
GFR, Estimated: 8 mL/min — ABNORMAL LOW (ref >60)
Glucose, Bld: 100 mg/dL — ABNORMAL HIGH (ref 70–99)
Phosphorus: 5.5 mg/dL — ABNORMAL HIGH (ref 2.5–4.6)
Potassium: 3.9 mmol/L (ref 3.5–5.1)
Sodium: 134 mmol/L — ABNORMAL LOW (ref 135–145)

## 2023-12-08 LAB — HEPATITIS B SURFACE ANTIGEN: Hepatitis B Surface Ag: NONREACTIVE

## 2023-12-08 MED ORDER — MIDODRINE HCL 5 MG PO TABS: 10.0000 mg | ORAL_TABLET | Freq: Three times a day (TID) | ORAL | Status: DC

## 2023-12-08 MED ORDER — DOXYCYCLINE HYCLATE 100 MG PO TABS: 100.0000 mg | ORAL_TABLET | Freq: Two times a day (BID) | ORAL | Status: DC

## 2023-12-08 MED ORDER — DOXYCYCLINE HYCLATE 100 MG PO TABS: 100.0000 mg | ORAL_TABLET | Freq: Two times a day (BID) | ORAL | 0 refills | Status: DC

## 2023-12-08 MED ORDER — ALBUMIN HUMAN 25 % IV SOLN
25.0000 g | Freq: Once | INTRAVENOUS | Status: DC
  Filled 2023-12-08: qty 100

## 2023-12-08 MED ORDER — MIDODRINE HCL 5 MG PO TABS
10.0000 mg | ORAL_TABLET | Freq: Once | ORAL | Status: AC
  Administered 2023-12-08: 10 mg via ORAL
  Filled 2023-12-08: qty 2

## 2023-12-08 MED ORDER — PREDNISONE 20 MG PO TABS: 20.0000 mg | ORAL_TABLET | Freq: Every day | ORAL | 0 refills | Status: DC

## 2023-12-08 NOTE — Progress Notes (Signed)
 Sheri Cooper is an 73 y.o. female COPD, hypertension, GI bleed, ESRD here with shortness of breath and productive cough.  Noted to be hypertensive with systolics in the 210 range in the ED with a white count of 14.3.    Assessment/Plan: # ESRD: HD today on TTS schedule. Dialysis prescription adjusted based on labs.  Tolerated dialysis on Tuesday with 2.5 L net UF; patient feels much better with markedly improved shortness of breath.  Next treatment today; from renal standpoint she is stable for outpatient dialysis.    # Volume/ hypertension: frequent problems with volume overload.  Dialysis with ultrafiltration today.continue home blood pressure medications.  Shortness of breath as below   # Anemia of Chronic Kidney Disease:hemoglobin in the eights.  8% TSAT F274. Could consider ESA; Hb 8.5 (stable) Loading with IV iron    # Secondary Hyperparathyroidism/Hyperphosphatemia: calcium  corrects to normal.  Obtain phosphorus level.continue home sevelamer .   # Vascular access: lt AVG with no issues   # shortness of breath: volume overload contributing but also being treated for COPD exacerbation. Recent echo 09/2023 EF of 60 to 65%.  Facial swelling otherwise no peripheral signs of edema; only had the right IJ TDC in for about 4 months so less likely to be SVC syndrome.  Chest x-rays with finding of moderate pleural effusions, pulmonary edema. Likely multifactorial.  She does not have a pulmonologist.  Recommend referral to pulmonology outpatient.     # COPD   # Additional recommendations: - Dose all meds for creatinine clearance < 10 ml/min  - Unless absolutely necessary, no MRIs with gadolinium.  - Implement save arm precautions.  Prefer needle sticks in the dorsum of the hands or wrists.  No blood pressure measurements in arm. - If blood transfusion is requested during hemodialysis sessions, please alert us  prior to the session.  - Use synthetic opioids (Fentanyl /Dilaudid ) if  needed   Subjective: Feeling much better after treatment on Tuesday; denies shortness of breath, nausea, vomiting, chest pain, headaches.   Chemistry and CBC: Creatinine, Ser  Date/Time Value Ref Range Status  12/08/2023 04:48 AM 5.45 (H) 0.44 - 1.00 mg/dL Final    Comment:    DELTA CHECK NOTED  12/07/2023 05:41 AM 3.45 (H) 0.44 - 1.00 mg/dL Final    Comment:    DELTA CHECK NOTED  12/06/2023 04:52 AM 7.05 (H) 0.44 - 1.00 mg/dL Final  91/95/7974 91:61 PM 6.85 (H) 0.44 - 1.00 mg/dL Final  91/97/7974 94:46 AM 6.81 (H) 0.44 - 1.00 mg/dL Final  91/97/7974 96:80 AM 6.75 (H) 0.44 - 1.00 mg/dL Final  94/72/7974 97:57 AM 6.29 (H) 0.44 - 1.00 mg/dL Final  94/73/7974 89:49 PM 6.60 (H) 0.44 - 1.00 mg/dL Final  94/85/7974 95:77 AM 5.56 (H) 0.44 - 1.00 mg/dL Final    Comment:    DELTA CHECK NOTED  09/13/2023 03:56 AM 3.58 (H) 0.44 - 1.00 mg/dL Final  94/87/7974 95:99 AM 5.93 (H) 0.44 - 1.00 mg/dL Final  94/88/7974 95:92 AM 4.29 (H) 0.44 - 1.00 mg/dL Final  94/89/7974 96:60 AM 7.06 (H) 0.44 - 1.00 mg/dL Final  94/90/7974 96:55 AM 5.89 (H) 0.44 - 1.00 mg/dL Final  94/91/7974 87:77 PM 5.15 (H) 0.44 - 1.00 mg/dL Final  87/69/7975 94:48 AM 7.30 (H) 0.44 - 1.00 mg/dL Final  89/96/7975 97:88 AM 4.83 (H) 0.44 - 1.00 mg/dL Final  90/73/7975 95:77 AM 5.23 (H) 0.44 - 1.00 mg/dL Final    Comment:    DELTA CHECK NOTED  01/26/2023 04:32 AM 3.46 (H)  0.44 - 1.00 mg/dL Final  90/75/7975 89:99 AM 5.78 (H) 0.44 - 1.00 mg/dL Final  92/70/7975 88:41 AM 5.70 (H) 0.44 - 1.00 mg/dL Final  94/90/7975 95:77 AM 3.56 (H) 0.44 - 1.00 mg/dL Final    Comment:    DELTA CHECK NOTED  09/08/2022 04:47 AM 1.82 (H) 0.44 - 1.00 mg/dL Final    Comment:    DELTA CHECK NOTED  09/07/2022 09:00 AM 3.80 (H) 0.44 - 1.00 mg/dL Final  95/83/7975 95:48 PM 1.50 (H) 0.44 - 1.00 mg/dL Final  97/96/7975 95:66 AM 2.88 (H) 0.44 - 1.00 mg/dL Final  97/97/7975 96:45 AM 2.31 (H) 0.44 - 1.00 mg/dL Final  97/98/7975 96:43 AM 1.72 (H) 0.44 -  1.00 mg/dL Final  97/98/7975 96:44 AM 1.68 (H) 0.44 - 1.00 mg/dL Final  98/68/7975 95:92 AM 2.13 (H) 0.44 - 1.00 mg/dL Final  98/68/7975 95:92 AM 2.20 (H) 0.44 - 1.00 mg/dL Final  98/69/7975 95:95 AM 1.70 (H) 0.44 - 1.00 mg/dL Final  98/70/7975 94:84 AM 2.34 (H) 0.44 - 1.00 mg/dL Final  98/71/7975 94:99 AM 1.88 (H) 0.44 - 1.00 mg/dL Final  98/72/7975 94:91 AM 2.55 (H) 0.44 - 1.00 mg/dL Final  98/73/7975 91:96 AM 1.71 (H) 0.44 - 1.00 mg/dL Final  98/73/7975 92:85 AM 1.76 (H) 0.44 - 1.00 mg/dL Final    Comment:    DELTA CHECK NOTED  05/27/2022 04:20 AM 2.98 (H) 0.44 - 1.00 mg/dL Final  98/75/7975 95:89 AM 2.00 (H) 0.44 - 1.00 mg/dL Final    Comment:    DELTA CHECK NOTED  05/25/2022 04:04 AM 3.10 (H) 0.44 - 1.00 mg/dL Final  98/77/7975 95:60 AM 2.54 (H) 0.44 - 1.00 mg/dL Final  98/78/7975 95:85 AM 3.54 (H) 0.44 - 1.00 mg/dL Final  98/79/7975 96:53 AM 2.73 (H) 0.44 - 1.00 mg/dL Final  98/80/7975 95:43 AM 4.03 (H) 0.44 - 1.00 mg/dL Final  98/81/7975 94:98 AM 5.92 (H) 0.44 - 1.00 mg/dL Final  98/82/7975 95:66 AM 5.57 (H) 0.44 - 1.00 mg/dL Final  98/83/7975 96:99 AM 8.12 (H) 0.44 - 1.00 mg/dL Final  98/84/7975 95:97 AM 7.51 (H) 0.44 - 1.00 mg/dL Final  98/85/7975 96:90 AM 7.50 (H) 0.44 - 1.00 mg/dL Final   Recent Labs  Lab 12/03/23 0319 12/03/23 0553 12/05/23 2038 12/06/23 0452 12/06/23 0519 12/07/23 0541 12/08/23 0448  NA 143 142 141 140  --  137 134*  K 4.1 3.8 4.1 4.6  --  4.1 3.9  CL 104 106 106 105  --  97* 94*  CO2 24 21* 21* 19*  --  25 25  GLUCOSE 87 124* 111* 123*  --  124* 100*  BUN 29* 29* 56* 60*  --  26* 55*  CREATININE 6.75* 6.81* 6.85* 7.05*  --  3.45* 5.45*  CALCIUM  9.1 8.8* 8.6* 8.8*  --  8.6* 8.0*  PHOS  --   --   --   --  2.9 5.2* 5.5*   Recent Labs  Lab 12/03/23 0319 12/03/23 0553 12/05/23 2038 12/06/23 0452 12/07/23 0541 12/08/23 0448  WBC 11.9*   < > 14.3* 11.9* 13.4* 12.1*  NEUTROABS 8.5*  --   --   --  12.0* 9.5*  HGB 8.8*   < > 8.5* 8.4* 8.3*  8.6*  HCT 27.8*   < > 27.0* 26.1* 25.8* 26.4*  MCV 85.8   < > 87.7 87.0 84.0 86.0  PLT 356   < > 420* 416* 422* 429*   < > = values in this interval not displayed.  Liver Function Tests: Recent Labs  Lab 12/03/23 0319 12/05/23 2038 12/07/23 0541 12/08/23 0448  AST 14* 17  --   --   ALT 6 8  --   --   ALKPHOS 89 95  --   --   BILITOT 0.7 0.7  --   --   PROT 6.8 6.7  --   --   ALBUMIN  3.1* 3.1* 3.8 3.7   No results for input(s): LIPASE, AMYLASE in the last 168 hours. No results for input(s): AMMONIA in the last 168 hours. Cardiac Enzymes: No results for input(s): CKTOTAL, CKMB, CKMBINDEX, TROPONINI in the last 168 hours. Iron  Studies:  Recent Labs    12/06/23 0519  IRON  19*  TIBC 235*  FERRITIN 274   PT/INR: @LABRCNTIP (inr:5)  Xrays/Other Studies: ) Results for orders placed or performed during the hospital encounter of 12/05/23 (from the past 48 hours)  Renal function panel     Status: Abnormal   Collection Time: 12/07/23  5:41 AM  Result Value Ref Range   Sodium 137 135 - 145 mmol/L   Potassium 4.1 3.5 - 5.1 mmol/L   Chloride 97 (L) 98 - 111 mmol/L   CO2 25 22 - 32 mmol/L   Glucose, Bld 124 (H) 70 - 99 mg/dL    Comment: Glucose reference range applies only to samples taken after fasting for at least 8 hours.   BUN 26 (H) 8 - 23 mg/dL   Creatinine, Ser 6.54 (H) 0.44 - 1.00 mg/dL    Comment: DELTA CHECK NOTED   Calcium  8.6 (L) 8.9 - 10.3 mg/dL   Phosphorus 5.2 (H) 2.5 - 4.6 mg/dL   Albumin  3.8 3.5 - 5.0 g/dL   GFR, Estimated 13 (L) >60 mL/min    Comment: (NOTE) Calculated using the CKD-EPI Creatinine Equation (2021)    Anion gap 15 5 - 15    Comment: Performed at [facility], [address] [place], [place] [address]  CBC with Differential/Platelet     Status: Abnormal   Collection Time: 12/07/23  5:41 AM  Result Value Ref Range   WBC 13.4 (H) 4.0 - 10.5 K/uL   RBC 3.07 (L) 3.87 - 5.11 MIL/uL   Hemoglobin 8.3 (L) 12.0 - 15.0 g/dL   HCT  74.1 (L) 63.9 - 46.0 %   MCV 84.0 80.0 - 100.0 fL   MCH 27.0 26.0 - 34.0 pg   MCHC 32.2 30.0 - 36.0 g/dL   RDW 79.9 (H) 88.4 - 84.4 %   Platelets 422 (H) 150 - 400 K/uL   nRBC 0.0 0.0 - 0.2 %   Neutrophils Relative % 90 %   Neutro Abs 12.0 (H) 1.7 - 7.7 K/uL   Lymphocytes Relative 7 %   Lymphs Abs 0.9 0.7 - 4.0 K/uL   Monocytes Relative 2 %   Monocytes Absolute 0.3 0.1 - 1.0 K/uL   Eosinophils Relative 0 %   Eosinophils Absolute 0.0 0.0 - 0.5 K/uL   Basophils Relative 0 %   Basophils Absolute 0.0 0.0 - 0.1 K/uL   Immature Granulocytes 1 %   Abs Immature Granulocytes 0.13 (H) 0.00 - 0.07 K/uL    Comment: Performed at [facility], [address], [place], [place] [address]  Renal function panel     Status: Abnormal   Collection Time: 12/08/23  4:48 AM  Result Value Ref Range   Sodium 134 (L) 135 - 145 mmol/L   Potassium 3.9 3.5 - 5.1 mmol/L   Chloride 94 (L) 98 -  111 mmol/L   CO2 25 22 - 32 mmol/L   Glucose, Bld 100 (H) 70 - 99 mg/dL    Comment: Glucose reference range applies only to samples taken after fasting for at least 8 hours.   BUN 55 (H) 8 - 23 mg/dL   Creatinine, Ser 4.54 (H) 0.44 - 1.00 mg/dL    Comment: DELTA CHECK NOTED   Calcium  8.0 (L) 8.9 - 10.3 mg/dL   Phosphorus 5.5 (H) 2.5 - 4.6 mg/dL   Albumin  3.7 3.5 - 5.0 g/dL   GFR, Estimated 8 (L) >60 mL/min    Comment: (NOTE) Calculated using the CKD-EPI Creatinine Equation (2021)    Anion gap 15 5 - 15    Comment: Performed at [facility], [address], [place], [place] [address]  CBC with Differential/Platelet     Status: Abnormal   Collection Time: 12/08/23  4:48 AM  Result Value Ref Range   WBC 12.1 (H) 4.0 - 10.5 K/uL   RBC 3.07 (L) 3.87 - 5.11 MIL/uL   Hemoglobin 8.6 (L) 12.0 - 15.0 g/dL   HCT 73.5 (L) 63.9 - 53.9 %   MCV 86.0 80.0 - 100.0 fL   MCH 28.0 26.0 - 34.0 pg   MCHC 32.6 30.0 - 36.0 g/dL   RDW 80.0 (H) 88.4 - 84.4 %   Platelets 429 (H) 150 - 400 K/uL   nRBC 0.0 0.0 - 0.2 %   Neutrophils  Relative % 79 %   Neutro Abs 9.5 (H) 1.7 - 7.7 K/uL   Lymphocytes Relative 13 %   Lymphs Abs 1.6 0.7 - 4.0 K/uL   Monocytes Relative 7 %   Monocytes Absolute 0.8 0.1 - 1.0 K/uL   Eosinophils Relative 0 %   Eosinophils Absolute 0.0 0.0 - 0.5 K/uL   Basophils Relative 0 %   Basophils Absolute 0.0 0.0 - 0.1 K/uL   Immature Granulocytes 1 %   Abs Immature Granulocytes 0.13 (H) 0.00 - 0.07 K/uL    Comment: Performed at [facility], [address] [place], [place] [address]   No results found.   PMH:   Past Medical History:  Diagnosis Date   Anemia    Chronic kidney disease (CKD), stage IV (severe) (HCC)    COPD (chronic obstructive pulmonary disease) (HCC)    ESRD (end stage renal disease) (HCC)    Hemodialysis T, TH, SAT, [address] [place]   GERD (gastroesophageal reflux disease)    History of blood transfusion    History of blood transfusion    Hypertension    Pneumonia    3 times in 2024-   PUD (peptic ulcer disease) 05/2022   Rheumatoid arthritis (HCC)    Rheumatoid arthritis (HCC)     PSH:   Past Surgical History:  Procedure Laterality Date   A/V SHUNTOGRAM N/A 05/02/2023   Procedure: A/V SHUNTOGRAM;  Surgeon: Pearline Norman RAMAN, MD;  Location: [facility] INVASIVE CV LAB;  Service: Cardiovascular;  Laterality: N/A;   AV FISTULA PLACEMENT Left 11/29/2022   Procedure: INSERTION OF LEFT UPPER  ARTERIOVENOUS (AV) GORE-TEX GRAFT ARM;  Surgeon: Lanis Fonda BRAVO, MD;  Location: [facility] OR;  Service: Vascular;  Laterality: Left;   CENTRAL VENOUS CATHETER INSERTION Left 05/27/2022   Procedure: INSERTION CENTRAL LINE ADULT;  Surgeon: Kallie Manuelita BROCKS, MD;  Location: [facility] ORS;  Service: General;  Laterality: Left;   COLONOSCOPY WITH PROPOFOL  N/A 05/22/2022   Surgeon: Eartha Angelia Sieving, MD; blood in the terminal ileum and colon.   ESOPHAGOGASTRODUODENOSCOPY (  EGD) WITH PROPOFOL  N/A 05/22/2022   Surgeon: Eartha Flavors, Toribio, MD; peptic ulcer disease with 2 nonbleeding cratered gastric  ulcers, multiple duodenal ulcers, 2 of which had adherent clots that were removed and revealed 2 vessels.  1 vessel was clipped, cauterized, and treated with epinephrine  injection.  Second vessel was clipped and treated with cautery, clip fell off.   IR FLUORO GUIDE CV LINE RIGHT  05/24/2022   IR US  GUIDE VASC ACCESS RIGHT  05/24/2022   LAPAROTOMY N/A 05/27/2022   Procedure: EXPLORATORY LAPAROTOMY, OMENTAL PATH, DRAIN PLACEMENT;  Surgeon: Kallie Manuelita BROCKS, MD;  Location: [facility] ORS;  Service: General;  Laterality: N/A;   PERIPHERAL VASCULAR BALLOON ANGIOPLASTY  05/02/2023   Procedure: PERIPHERAL VASCULAR BALLOON ANGIOPLASTY;  Surgeon: Pearline Norman RAMAN, MD;  Location: [facility] INVASIVE CV LAB;  Service: Cardiovascular;;   PERIPHERAL VASCULAR THROMBECTOMY Left 08/18/2023   Procedure: PERIPHERAL VASCULAR THROMBECTOMY;  Surgeon: Melia Lynwood ORN, MD;  Location: [facility] INVASIVE CV LAB;  Service: Cardiovascular;  Laterality: Left;   VENOUS ANGIOPLASTY Left 08/18/2023   Procedure: VENOUS ANGIOPLASTY;  Surgeon: Melia Lynwood ORN, MD;  Location: [facility] INVASIVE CV LAB;  Service: Cardiovascular;  Laterality: Left;  80% Intragraft; 50% Venous Anastomosis   VOLVULUS REDUCTION     colon resection    Allergies: No Known Allergies  Medications:   Prior to Admission medications   Medication Sig Start Date End Date Taking? Authorizing Provider  albuterol  (VENTOLIN  HFA) 108 (90 Base) MCG/ACT inhaler Inhale 2 puffs into the lungs every 6 (six) hours as needed for wheezing or shortness of breath. 09/28/23  Yes Maree, Pratik D, DO  amLODipine  (NORVASC ) 10 MG tablet Take 10 mg by mouth daily.   Yes [provider]  ferrous sulfate  325 (65 FE) MG EC tablet Take 325 mg by mouth 3 (three) times a week. Tuesday, Thursday, and Saturday at dialysis   Yes [provider]  hydrALAZINE  (APRESOLINE ) 50 MG tablet Take 1 tablet (50 mg total) by mouth 2 (two) times daily. 09/15/23  Yes Tat, Alm, MD  losartan  (COZAAR ) 50 MG tablet Take 1  tablet (50 mg total) by mouth daily. 09/16/23  Yes Tat, Alm, MD  metoprolol  succinate (TOPROL -XL) 50 MG 24 hr tablet Take 50 mg by mouth daily. 10/15/22  Yes [provider]  sevelamer  carbonate (RENVELA ) 800 MG tablet Take 2,400 mg by mouth 3 (three) times daily with meals.   Yes [provider]    Discontinued Meds:   Medications Discontinued During This Encounter  Medication Reason   b complex-vitamin c-folic acid  (NEPHRO-VITE) 0.8 MG TABS tablet Patient Preference   pantoprazole  (PROTONIX ) 40 MG tablet Patient Preference   hydrALAZINE  (APRESOLINE ) tablet 25 mg    hydrALAZINE  (APRESOLINE ) tablet 50 mg    losartan  (COZAAR ) tablet 50 mg    pentafluoroprop-tetrafluoroeth (GEBAUERS) aerosol 1 Application Duplicate   lidocaine  (PF) (XYLOCAINE ) 1 % injection 5 mL Duplicate   lidocaine -prilocaine  (EMLA ) cream 1 Application Duplicate   heparin  injection 1,000 Units Duplicate   alteplase  (CATHFLO ACTIVASE ) injection 2 mg Duplicate   ipratropium-albuterol  (DUONEB) 0.5-2.5 (3) MG/3ML nebulizer solution 3 mL    midodrine  (PROAMATINE ) tablet 10 mg     Social History:  reports that she has quit smoking. Her smoking use included cigarettes. She has been exposed to tobacco smoke. She has never used smokeless tobacco. She reports that she does not currently use alcohol. She reports that she does not use drugs.  Family History:  History reviewed. No pertinent family history.  Blood  pressure (!) 124/95, pulse 63, temperature 97.7 F (36.5 C), temperature source Oral, resp. rate 18, height 4' 11 (1.499 m), weight 42 kg, SpO2 98%. Physical Exam: General: well-appearing, NAD HEENT: NCAT CV: normal rate, no rub, no lower extremity edema Lungs: bilateral chest rise, normal RR Abd: SNDNT +BS Skin: no visible lesions or rashes Psych: alert, engaged, appropriate mood and affect Neuro: normal speech, no gross focal deficits  Access: LUA AVG + pulse    Kincade Granberg, LYNWOOD ORN, MD 12/08/2023,  9:43 AM

## 2023-12-08 NOTE — Progress Notes (Addendum)
 D/c order noted. Contacted [facility] HD clinic to inform pt will be there for treatment Saturday per schedule. D/c summary and last note faxed to clinic at this time.   Lavanda Videl Nobrega Dialysis Navigator [phone]

## 2023-12-08 NOTE — Discharge Summary (Signed)
 Physician Discharge Summary   Patient: Sheri Cooper MRN: [id number] DOB: 1950-07-15  Admit date:     12/05/2023  Discharge date: 12/08/23  Discharge Physician: Alm Auburn Hert   PCP: Toribio Jerel MATSU, MD   Recommendations at discharge:   Please follow up with primary care provider within 1-2 weeks  Please repeat BMP and CBC in one week    Hospital Course: 73 y.o. 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  Patient presented to the ED with complaints of difficulty breathing and productive cough that started this morning.  She has not missed any dialysis sessions.  She makes a little amount of urine.  No chest pain.  Lower extremity swelling.  No abdominal bloating.  She reports facial swelling -typically occurs when she is volume overloaded.    Patient was hospitalized and discharged same day 8/2, for volume overload secondary to ESRD, she was dialyzed and discharged home.   ED Course: Temperature 97.7.  Heart rate 79-107.  Respiratory rate 15-33.  Blood pressure elevated systolic up to 210.  O2 sats on my evaluation remained at 97% on room air.  She was placed on 2 L nasal cannula for comfort.  WBC 14.3.  BNP 802.  BUN of 56.  Potassium 4.1.  She was admitted for COPD exacerbation, DOE, volume overload.    Assessment and Plan:  COPD exacerbation - admitted with increased work of breathing - Encouraged incentive spirometry, flutter valve, - added brovana  - added pulmicort  - initially on solumedrol>>prednisone  - continue duonebs - 8/7 ambulatory pulseox did not show desaturation <94%   Diarrhea - resolved  - suspect from antibiotics   Acute on chronic HFpEF - 09/27/23 Echo EF 60-65%, no WMA, normal RVF - CXR personally reviewed--bilateral pleural effusions, increased interstitial markings - fluid removal via HD  - received HD 8/7   ESRD - On hemodialysis TTS schedule,  - next HD  8/7   Hypertension - continue with home medications - hold losartan  and hydralazine  for soft BPs>>restart as BP high now - continue metoprolol  and amlodipine     Anemia of chronic kidney disease - Continue with p.o. iron , IV iron 's and IV iron  per renal        Consultants: renal Procedures performed: none  Disposition: Home Diet recommendation:  Renal diet DISCHARGE MEDICATION: Allergies as of 12/08/2023   No Known Allergies      Medication List     TAKE these medications    albuterol  108 (90 Base) MCG/ACT inhaler Commonly known as: VENTOLIN  HFA Inhale 2 puffs into the lungs every 6 (six) hours as needed for wheezing or shortness of breath.   amLODipine  10 MG tablet Commonly known as: NORVASC  Take 10 mg by mouth daily.   doxycycline  100 MG tablet Commonly known as: VIBRA -TABS Take 1 tablet (100 mg total) by mouth every 12 (twelve) hours.   ferrous sulfate  325 (65 FE) MG EC tablet Take 325 mg by mouth 3 (three) times a week. Tuesday, Thursday, and Saturday at dialysis   hydrALAZINE  50 MG tablet Commonly known as: APRESOLINE  Take 1 tablet (50 mg total) by mouth 2 (two) times daily.   losartan  50 MG tablet Commonly known as: COZAAR  Take 1 tablet (50 mg total) by mouth daily.   metoprolol  succinate 50 MG 24 hr tablet Commonly known as: TOPROL -XL Take 50 mg by mouth daily.   predniSONE  20 MG tablet Commonly known as: DELTASONE  Take 1 tablet (  20 mg total) by mouth daily with breakfast. Start taking on: December 09, 2023   sevelamer  carbonate 800 MG tablet Commonly known as: RENVELA  Take 2,400 mg by mouth 3 (three) times daily with meals.        Discharge Exam: Filed Weights   12/08/23 0354 12/08/23 0850 12/08/23 1349  Weight: 42 kg 42 kg 40 kg   HEENT:  Nambe/AT, No thrush, no icterus CV:  RRR, no rub, no S3, no S4 Lung:  CTA, no wheeze, no rhonchi Abd:  soft/+BS, NT Ext:  No edema, no lymphangitis, no synovitis, no rash   Condition at discharge:  stable  The results of significant diagnostics from this hospitalization (including imaging, microbiology, ancillary and laboratory) are listed below for reference.   Imaging Studies: DG Chest Port 1 View Result Date: 12/05/2023 CLINICAL DATA:  Shortness of breath. EXAM: PORTABLE CHEST 1 VIEW COMPARISON:  Radiograph 2 days ago 12/03/2023 FINDINGS: Again seen cardiomegaly. Moderate pleural effusions are unchanged from recent exam. Again seen diffuse interstitial thickening suspicious for pulmonary edema. Bibasilar opacities are unchanged. No pneumothorax. Left chest wall/axillary surgical clips. IMPRESSION: 1. Findings again suggestive of CHF. Unchanged cardiomegaly, moderate pleural effusions, and diffuse interstitial thickening suspicious for pulmonary edema. 2. Bibasilar opacities are unchanged. Electronically Signed   By: Andrea Gasman M.D.   On: 12/05/2023 21:18   DG Chest Portable 1 View Result Date: 12/03/2023 EXAM: 1 VIEW XRAY OF THE CHEST 12/03/2023 03:18:00 AM COMPARISON: 09/26/2023 CLINICAL HISTORY: Dyspnea. FINDINGS: LUNGS AND PLEURA: Moderate bilateral pleural effusions. Pulmonary vascular congestion. Interstitial edema. Bibasilar airspace opacities. No pneumothorax. HEART AND MEDIASTINUM: Stable cardiomegaly. Aortic atherosclerotic calcification. BONES AND SOFT TISSUES: No acute osseous abnormality. IMPRESSION: 1. Findings most suggestive of CHF with moderate bilateral pleural effusions similar to prior. Multifocal pneumonia could appear similarly. Electronically signed by: Norman Gatlin MD 12/03/2023 03:26 AM EDT RP Workstation: [id number]    Microbiology: Results for orders placed or performed during the hospital encounter of 12/05/23  Resp panel by RT-PCR (RSV, Flu A&B, Covid) Anterior Nasal Swab     Status: None   Collection Time: 12/05/23  8:18 PM   Specimen: Anterior Nasal Swab  Result Value Ref Range Status   SARS Coronavirus 2 by RT PCR NEGATIVE NEGATIVE Final    Comment:  (NOTE) SARS-CoV-2 target nucleic acids are NOT DETECTED.  The SARS-CoV-2 RNA is generally detectable in upper respiratory specimens during the acute phase of infection. The lowest concentration of SARS-CoV-2 viral copies this assay can detect is 138 copies/mL. A negative result does not preclude SARS-Cov-2 infection and should not be used as the sole basis for treatment or other patient management decisions. A negative result may occur with  improper specimen collection/handling, submission of specimen other than nasopharyngeal swab, presence of viral mutation(s) within the areas targeted by this assay, and inadequate number of viral copies(<138 copies/mL). A negative result must be combined with clinical observations, patient history, and epidemiological information. The expected result is Negative.  Fact Sheet for Patients:  BloggerCourse.com  Fact Sheet for Healthcare Providers:  SeriousBroker.it  This test is no t yet approved or cleared by the [place]  FDA and  has been authorized for detection and/or diagnosis of SARS-CoV-2 by FDA under an Emergency Use Authorization (EUA). This EUA will remain  in effect (meaning this test can be used) for the duration of the COVID-19 declaration under Section 564(b)(1) of the Act, 21 U.S.C.section 360bbb-3(b)(1), unless the authorization is terminated  or revoked sooner.  Influenza A by PCR NEGATIVE NEGATIVE Final   Influenza B by PCR NEGATIVE NEGATIVE Final    Comment: (NOTE) The Xpert Xpress SARS-CoV-2/FLU/RSV plus assay is intended as an aid in the diagnosis of influenza from Nasopharyngeal swab specimens and should not be used as a sole basis for treatment. Nasal washings and aspirates are unacceptable for Xpert Xpress SARS-CoV-2/FLU/RSV testing.  Fact Sheet for Patients: BloggerCourse.com  Fact Sheet for Healthcare  Providers: SeriousBroker.it  This test is not yet approved or cleared by the [place]  FDA and has been authorized for detection and/or diagnosis of SARS-CoV-2 by FDA under an Emergency Use Authorization (EUA). This EUA will remain in effect (meaning this test can be used) for the duration of the COVID-19 declaration under Section 564(b)(1) of the Act, 21 U.S.C. section 360bbb-3(b)(1), unless the authorization is terminated or revoked.     Resp Syncytial Virus by PCR NEGATIVE NEGATIVE Final    Comment: (NOTE) Fact Sheet for Patients: BloggerCourse.com  Fact Sheet for Healthcare Providers: SeriousBroker.it  This test is not yet approved or cleared by the [place]  FDA and has been authorized for detection and/or diagnosis of SARS-CoV-2 by FDA under an Emergency Use Authorization (EUA). This EUA will remain in effect (meaning this test can be used) for the duration of the COVID-19 declaration under Section 564(b)(1) of the Act, 21 U.S.C. section 360bbb-3(b)(1), unless the authorization is terminated or revoked.  Performed at [facility], [address], [place], [place] [address]     Labs: CBC: Recent Labs  Lab 12/03/23 0319 12/03/23 0553 12/05/23 2038 12/06/23 0452 12/07/23 0541 12/08/23 0448  WBC 11.9* 13.6* 14.3* 11.9* 13.4* 12.1*  NEUTROABS 8.5*  --   --   --  12.0* 9.5*  HGB 8.8* 8.9* 8.5* 8.4* 8.3* 8.6*  HCT 27.8* 28.2* 27.0* 26.1* 25.8* 26.4*  MCV 85.8 86.5 87.7 87.0 84.0 86.0  PLT 356 350 420* 416* 422* 429*   Basic Metabolic Panel: Recent Labs  Lab 12/03/23 0553 12/05/23 2038 12/06/23 0452 12/06/23 0519 12/07/23 0541 12/08/23 0448  NA 142 141 140  --  137 134*  K 3.8 4.1 4.6  --  4.1 3.9  CL 106 106 105  --  97* 94*  CO2 21* 21* 19*  --  25 25  GLUCOSE 124* 111* 123*  --  124* 100*  BUN 29* 56* 60*  --  26* 55*  CREATININE 6.81* 6.85* 7.05*  --  3.45* 5.45*   CALCIUM  8.8* 8.6* 8.8*  --  8.6* 8.0*  MG  --  2.5*  --   --   --   --   PHOS  --   --   --  2.9 5.2* 5.5*   Liver Function Tests: Recent Labs  Lab 12/03/23 0319 12/05/23 2038 12/07/23 0541 12/08/23 0448  AST 14* 17  --   --   ALT 6 8  --   --   ALKPHOS 89 95  --   --   BILITOT 0.7 0.7  --   --   PROT 6.8 6.7  --   --   ALBUMIN  3.1* 3.1* 3.8 3.7   CBG: No results for input(s): GLUCAP in the last 168 hours.  Discharge time spent: greater than 30 minutes.  Signed: Alm Schneider, MD [facility] 12/08/2023

## 2023-12-08 NOTE — Progress Notes (Signed)
 Pt completed dialysis treatment without issue. Pt goal met.Midodrine  10 mg po given before HD.  12/08/23 1349  Vitals  Temp 98 F (36.7 C)  Temp Source Oral  BP (!) 162/50  BP Location Right Arm  BP Method Automatic  Patient Position (if appropriate) Lying  Pulse Rate 60  ECG Heart Rate 60  Weight 40 kg  Type of Weight Post-Dialysis  Oxygen Therapy  SpO2 100 %  O2 Device Nasal Cannula  O2 Flow Rate (L/min) 2 L/min  During Treatment Monitoring  Intra-Hemodialysis Comments Tx completed  Post Treatment  Dialyzer Clearance Lightly streaked  Hemodialysis Intake (mL) 0 mL  Liters Processed 70  Fluid Removed (mL) 2000 mL  Tolerated HD Treatment Yes  Post-Hemodialysis Comments Pt goal met.  AVG/AVF Arterial Site Held (minutes) 10 minutes  AVG/AVF Venous Site Held (minutes) 10 minutes  Fistula / Graft Left Upper arm Arteriovenous vein graft  Placement Date/Time: 11/29/22 1428   Orientation: Left  Access Location: Upper arm  Access Type: Arteriovenous vein graft  Site Condition No complications  Fistula / Graft Assessment Present;Thrill;Bruit  Status Deaccessed  Needle Size 15  Drainage Description None

## 2023-12-09 ENCOUNTER — Telehealth: Payer: Self-pay

## 2023-12-09 DIAGNOSIS — N186 End stage renal disease: Secondary | ICD-10-CM | POA: Diagnosis not present

## 2023-12-09 DIAGNOSIS — E782 Mixed hyperlipidemia: Secondary | ICD-10-CM | POA: Diagnosis not present

## 2023-12-09 DIAGNOSIS — D649 Anemia, unspecified: Secondary | ICD-10-CM | POA: Diagnosis not present

## 2023-12-09 DIAGNOSIS — J449 Chronic obstructive pulmonary disease, unspecified: Secondary | ICD-10-CM | POA: Diagnosis not present

## 2023-12-09 DIAGNOSIS — I10 Essential (primary) hypertension: Secondary | ICD-10-CM | POA: Diagnosis not present

## 2023-12-09 DIAGNOSIS — K219 Gastro-esophageal reflux disease without esophagitis: Secondary | ICD-10-CM | POA: Diagnosis not present

## 2023-12-09 LAB — HEPATITIS B SURFACE ANTIBODY, QUANTITATIVE: Hep B S AB Quant (Post): <3.5 m[IU]/mL — ABNORMAL LOW

## 2023-12-09 NOTE — Transitions of Care (Post Inpatient/ED Visit) (Signed)
 Today's TOC FU Call Status: Today's TOC FU Call Status:: Successful TOC FU Call Completed TOC FU Call Complete Date: 12/09/23 Patient's Name and Date of Birth confirmed.  Hospital Course: (From DC Summary on 12/08/23)  73 y.o. 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  Patient presented to the ED with complaints of difficulty breathing and productive cough that started this morning.  She has not missed any dialysis sessions.  She makes a little amount of urine.  No chest pain.  Lower extremity swelling.  No abdominal bloating.  She reports facial swelling typically occurs when she is volume overloaded.  Transition Care Management Follow-up Telephone Call Date of Discharge: 12/08/23 [facility] Type of Discharge: Inpatient Admission Primary Inpatient Discharge Diagnosis:: COPD with acute exacerbation How have you been since you were released from the hospital?: Better Any questions or concerns?: No  Items Reviewed: Did you receive and understand the discharge instructions provided?: Yes Medications obtained,verified, and reconciled?: Yes (Medications Reviewed) Any new allergies since your discharge?: No Dietary orders reviewed?: Yes Type of Diet Ordered:: Renal diet Do you have support at home?: Yes People in Home [RPT]: sibling(s) Name of Support/Comfort Primary Source: smith,melvin (Brother)  [phone] (Mobile)  Medications Reviewed Today: Medications Reviewed Today     Reviewed by Carolee Heron NOVAK, RN (Case Manager) on 12/09/23 at [phone]  Med List Status: <None>   Medication Order Taking? Sig Documenting Provider Last Dose Status Informant  albuterol  (VENTOLIN  HFA) 108 (90 Base) MCG/ACT inhaler [id number] Yes Inhale 2 puffs into the lungs every 6 (six) hours as needed for wheezing or shortness of breath. Maree Bracken D, DO  Active Self, Pharmacy Records            Med Note [place], CHUCK KANDICE Kitchens Dec 05, 2023  9:46 PM) Pt needs a refill  amLODipine  (NORVASC ) 10 MG tablet [id number] Yes Take 10 mg by mouth daily. [provider]  Active Self, Pharmacy Records  doxycycline  (VIBRA -TABS) 100 MG tablet [id number] Yes Take 1 tablet (100 mg total) by mouth every 12 (twelve) hours. Evonnie Lenis, MD  Active   ferrous sulfate  325 (65 FE) MG EC tablet [id number] Yes Take 325 mg by mouth 3 (three) times a week. Tuesday, Thursday, and Saturday at dialysis [provider]  Active Self, Pharmacy Records  hydrALAZINE  (APRESOLINE ) 50 MG tablet [id number] Yes Take 1 tablet (50 mg total) by mouth 2 (two) times daily. Evonnie Lenis, MD  Active Self, Pharmacy Records  losartan  (COZAAR ) 50 MG tablet [id number] Yes Take 1 tablet (50 mg total) by mouth daily. Evonnie Lenis, MD  Active Self, Pharmacy Records  metoprolol  succinate (TOPROL -XL) 50 MG 24 hr tablet [id number] Yes Take 50 mg by mouth daily. [provider]  Active Self, Pharmacy Records  predniSONE  (DELTASONE ) 20 MG tablet [id number] Yes Take 1 tablet (20 mg total) by mouth daily with breakfast. Tat, Lenis, MD  Active   sevelamer  carbonate (RENVELA ) 800 MG tablet [id number] Yes Take 2,400 mg by mouth 3 (three) times daily with meals. [provider]  Active Self, Pharmacy Records  sodium chloride  flush (NS) 0.9 % injection 3 mL [id number]   Pearline Norman RAMAN, MD  Active   Med List Note (Ward, Angelica, CPhT 09/08/23 1518): Tuesday, Thursday, Saturday dialysis            Home Care and Equipment/Supplies: Were Home Health Services Ordered?: No Any new equipment  or medical supplies ordered?: Yes Name of Medical supply agency?: Unkown via PCP for home nebulizer per call with PCP RN this am Were you able to get the equipment/medical supplies?:  (Pending delivery today per patient) Do you have any questions related to the use of the equipment/supplies?: No  Functional Questionnaire: Do you need  assistance with bathing/showering or dressing?: No Do you need assistance with meal preparation?: No Do you need assistance with eating?: No Do you have difficulty maintaining continence: No Do you need assistance with getting out of bed/getting out of a chair/moving?: No Do you have difficulty managing or taking your medications?: No  Follow up appointments reviewed: PCP Follow-up appointment confirmed?: No (Spoke to office this am and are calling back to schedule appt with patient. Has HD on Saturday as well) Specialist Hospital Follow-up appointment confirmed?: Yes (Pulmonary referrral from PCP office pending as of call to patient this am.) Date of Specialist follow-up appointment?: 12/10/23 Follow-Up Specialty Provider:: Nephrology at [facility] [place] Do you need transportation to your follow-up appointment?: No Do you understand care options if your condition(s) worsen?: Yes-patient verbalized understanding  SDOH Interventions Today    Flowsheet Row Most Recent Value  SDOH Interventions   Food Insecurity Interventions Intervention Not Indicated, Other (Comment)  Housing Interventions Intervention Not Indicated  Transportation Interventions Payor Benefit, Patient Resources (Friends/Family), Intervention Not Indicated  Utilities Interventions Intervention Not Indicated   12/09/23 1030 am TOC RN CM completed successful post discharge telephonic outreach with patient Patient verbally declined need for follow up calls due to HD 3 times a week covering/following much of the same things reviewed today, but appreciated the call today.  Patient has been in contact with PCP regarding HFU and an order for a nebulizer to be delivered today PCP office will call back to schedule HFU (Declined need for Care Guide assist).  A pulmonary referral is being made as well by PCP per patient call with office this am.  Medications to be delivered today by her pharmacy; new medications reviewed with  continued medications.  HD T, TH, Sat at [name] HD Navigator [phone] [name] [phone] The patient has been provided with contact information for the care management team, is active with MyChart,  in case she changes her mind or has a new need, and has been advised to call with any health-related questions or concerns if needed that are not urgent or emergent.  The patient is directed to their insurance card regarding availability of benefits coverage     [name] MSN, RN RN Case Manager [facility]  [facility] Office Hours M-F [phone] Direct Dial : [phone] Main Phone [phone]  Fax: [phone] [email or web address]

## 2023-12-11 ENCOUNTER — Emergency Department (HOSPITAL_COMMUNITY)
Admission: EM | Admit: 2023-12-11 | Discharge: 2023-12-12 | Disposition: A | Discharge status: Home / Self Care | Source: Home / Self Care | Attending: Emergency Medicine | Admitting: Emergency Medicine

## 2023-12-11 DIAGNOSIS — E8779 Other fluid overload: Secondary | ICD-10-CM | POA: Diagnosis not present

## 2023-12-11 DIAGNOSIS — J918 Pleural effusion in other conditions classified elsewhere: Secondary | ICD-10-CM | POA: Diagnosis not present

## 2023-12-11 DIAGNOSIS — I120 Hypertensive chronic kidney disease with stage 5 chronic kidney disease or end stage renal disease: Secondary | ICD-10-CM | POA: Insufficient documentation

## 2023-12-11 DIAGNOSIS — I132 Hypertensive heart and chronic kidney disease with heart failure and with stage 5 chronic kidney disease, or end stage renal disease: Secondary | ICD-10-CM | POA: Diagnosis not present

## 2023-12-11 DIAGNOSIS — J9601 Acute respiratory failure with hypoxia: Secondary | ICD-10-CM | POA: Diagnosis not present

## 2023-12-11 DIAGNOSIS — J449 Chronic obstructive pulmonary disease, unspecified: Secondary | ICD-10-CM | POA: Insufficient documentation

## 2023-12-11 DIAGNOSIS — Z7952 Long term (current) use of systemic steroids: Secondary | ICD-10-CM | POA: Diagnosis not present

## 2023-12-11 DIAGNOSIS — Z87891 Personal history of nicotine dependence: Secondary | ICD-10-CM | POA: Diagnosis not present

## 2023-12-11 DIAGNOSIS — N2581 Secondary hyperparathyroidism of renal origin: Secondary | ICD-10-CM | POA: Diagnosis not present

## 2023-12-11 DIAGNOSIS — J441 Chronic obstructive pulmonary disease with (acute) exacerbation: Secondary | ICD-10-CM

## 2023-12-11 DIAGNOSIS — J90 Pleural effusion, not elsewhere classified: Secondary | ICD-10-CM | POA: Diagnosis not present

## 2023-12-11 DIAGNOSIS — K219 Gastro-esophageal reflux disease without esophagitis: Secondary | ICD-10-CM | POA: Diagnosis not present

## 2023-12-11 DIAGNOSIS — Z79899 Other long term (current) drug therapy: Secondary | ICD-10-CM | POA: Insufficient documentation

## 2023-12-11 DIAGNOSIS — Z992 Dependence on renal dialysis: Secondary | ICD-10-CM | POA: Diagnosis not present

## 2023-12-11 DIAGNOSIS — I517 Cardiomegaly: Secondary | ICD-10-CM | POA: Diagnosis not present

## 2023-12-11 DIAGNOSIS — I5033 Acute on chronic diastolic (congestive) heart failure: Secondary | ICD-10-CM | POA: Diagnosis not present

## 2023-12-11 DIAGNOSIS — D72829 Elevated white blood cell count, unspecified: Secondary | ICD-10-CM | POA: Diagnosis not present

## 2023-12-11 DIAGNOSIS — R0602 Shortness of breath: Secondary | ICD-10-CM | POA: Insufficient documentation

## 2023-12-11 DIAGNOSIS — R062 Wheezing: Secondary | ICD-10-CM | POA: Diagnosis not present

## 2023-12-11 DIAGNOSIS — Z8711 Personal history of peptic ulcer disease: Secondary | ICD-10-CM | POA: Diagnosis not present

## 2023-12-11 DIAGNOSIS — D631 Anemia in chronic kidney disease: Secondary | ICD-10-CM | POA: Diagnosis not present

## 2023-12-11 DIAGNOSIS — R200 Anesthesia of skin: Secondary | ICD-10-CM | POA: Diagnosis not present

## 2023-12-11 DIAGNOSIS — J811 Chronic pulmonary edema: Secondary | ICD-10-CM | POA: Diagnosis not present

## 2023-12-11 DIAGNOSIS — R918 Other nonspecific abnormal finding of lung field: Secondary | ICD-10-CM | POA: Diagnosis not present

## 2023-12-11 DIAGNOSIS — N186 End stage renal disease: Secondary | ICD-10-CM | POA: Insufficient documentation

## 2023-12-11 DIAGNOSIS — E8770 Fluid overload, unspecified: Secondary | ICD-10-CM | POA: Diagnosis not present

## 2023-12-11 DIAGNOSIS — M069 Rheumatoid arthritis, unspecified: Secondary | ICD-10-CM | POA: Diagnosis not present

## 2023-12-11 DIAGNOSIS — I10 Essential (primary) hypertension: Secondary | ICD-10-CM | POA: Diagnosis not present

## 2023-12-12 ENCOUNTER — Encounter (HOSPITAL_COMMUNITY): Payer: Self-pay | Admitting: Emergency Medicine

## 2023-12-12 ENCOUNTER — Encounter (HOSPITAL_COMMUNITY): Payer: Self-pay

## 2023-12-12 ENCOUNTER — Inpatient Hospital Stay (HOSPITAL_COMMUNITY)
Admission: EM | Admit: 2023-12-12 | Discharge: 2023-12-15 | DRG: 291 | Disposition: A | Discharge status: Home / Self Care | Attending: Family Medicine | Admitting: Family Medicine

## 2023-12-12 ENCOUNTER — Emergency Department (HOSPITAL_COMMUNITY)

## 2023-12-12 ENCOUNTER — Other Ambulatory Visit: Payer: Self-pay

## 2023-12-12 DIAGNOSIS — I10 Essential (primary) hypertension: Secondary | ICD-10-CM | POA: Diagnosis present

## 2023-12-12 DIAGNOSIS — E8770 Fluid overload, unspecified: Secondary | ICD-10-CM | POA: Diagnosis present

## 2023-12-12 DIAGNOSIS — Z7952 Long term (current) use of systemic steroids: Secondary | ICD-10-CM

## 2023-12-12 DIAGNOSIS — Z8711 Personal history of peptic ulcer disease: Secondary | ICD-10-CM

## 2023-12-12 DIAGNOSIS — Z87891 Personal history of nicotine dependence: Secondary | ICD-10-CM

## 2023-12-12 DIAGNOSIS — N186 End stage renal disease: Secondary | ICD-10-CM | POA: Diagnosis present

## 2023-12-12 DIAGNOSIS — F419 Anxiety disorder, unspecified: Secondary | ICD-10-CM | POA: Diagnosis present

## 2023-12-12 DIAGNOSIS — J441 Chronic obstructive pulmonary disease with (acute) exacerbation: Secondary | ICD-10-CM | POA: Diagnosis not present

## 2023-12-12 DIAGNOSIS — R6889 Other general symptoms and signs: Secondary | ICD-10-CM | POA: Diagnosis not present

## 2023-12-12 DIAGNOSIS — Z1152 Encounter for screening for COVID-19: Secondary | ICD-10-CM

## 2023-12-12 DIAGNOSIS — J918 Pleural effusion in other conditions classified elsewhere: Secondary | ICD-10-CM | POA: Diagnosis present

## 2023-12-12 DIAGNOSIS — I499 Cardiac arrhythmia, unspecified: Secondary | ICD-10-CM | POA: Diagnosis not present

## 2023-12-12 DIAGNOSIS — D72829 Elevated white blood cell count, unspecified: Secondary | ICD-10-CM | POA: Diagnosis present

## 2023-12-12 DIAGNOSIS — J9601 Acute respiratory failure with hypoxia: Secondary | ICD-10-CM | POA: Diagnosis present

## 2023-12-12 DIAGNOSIS — K219 Gastro-esophageal reflux disease without esophagitis: Secondary | ICD-10-CM | POA: Diagnosis present

## 2023-12-12 DIAGNOSIS — Z992 Dependence on renal dialysis: Secondary | ICD-10-CM

## 2023-12-12 DIAGNOSIS — M069 Rheumatoid arthritis, unspecified: Secondary | ICD-10-CM | POA: Diagnosis present

## 2023-12-12 DIAGNOSIS — D631 Anemia in chronic kidney disease: Secondary | ICD-10-CM | POA: Diagnosis present

## 2023-12-12 DIAGNOSIS — R0602 Shortness of breath: Secondary | ICD-10-CM | POA: Diagnosis not present

## 2023-12-12 DIAGNOSIS — I132 Hypertensive heart and chronic kidney disease with heart failure and with stage 5 chronic kidney disease, or end stage renal disease: Principal | ICD-10-CM | POA: Diagnosis present

## 2023-12-12 DIAGNOSIS — I5033 Acute on chronic diastolic (congestive) heart failure: Secondary | ICD-10-CM | POA: Diagnosis present

## 2023-12-12 DIAGNOSIS — N2581 Secondary hyperparathyroidism of renal origin: Secondary | ICD-10-CM | POA: Diagnosis present

## 2023-12-12 DIAGNOSIS — Z79899 Other long term (current) drug therapy: Secondary | ICD-10-CM

## 2023-12-12 LAB — CBC
HCT: 29.6 % — ABNORMAL LOW (ref 36.0–46.0)
Hemoglobin: 9.9 g/dL — ABNORMAL LOW (ref 12.0–15.0)
MCH: 28.4 pg (ref 26.0–34.0)
MCHC: 33.4 g/dL (ref 30.0–36.0)
MCV: 85.1 fL (ref 80.0–100.0)
Platelets: 316 K/uL (ref 150–400)
RBC: 3.48 MIL/uL — ABNORMAL LOW (ref 3.87–5.11)
RDW: 19.9 % — ABNORMAL HIGH (ref 11.5–15.5)
WBC: 18.1 K/uL — ABNORMAL HIGH (ref 4.0–10.5)
nRBC: 0 % (ref 0.0–0.2)

## 2023-12-12 LAB — BASIC METABOLIC PANEL WITH GFR
Anion gap: 17 — ABNORMAL HIGH (ref 5–15)
Anion gap: 19 — ABNORMAL HIGH (ref 5–15)
BUN: 52 mg/dL — ABNORMAL HIGH (ref 8–23)
BUN: 72 mg/dL — ABNORMAL HIGH (ref 8–23)
CO2: 17 mmol/L — ABNORMAL LOW (ref 22–32)
CO2: 18 mmol/L — ABNORMAL LOW (ref 22–32)
Calcium: 7.9 mg/dL — ABNORMAL LOW (ref 8.9–10.3)
Calcium: 7.9 mg/dL — ABNORMAL LOW (ref 8.9–10.3)
Chloride: 104 mmol/L (ref 98–111)
Chloride: 104 mmol/L (ref 98–111)
Creatinine, Ser: 6.53 mg/dL — ABNORMAL HIGH (ref 0.44–1.00)
Creatinine, Ser: 7.81 mg/dL — ABNORMAL HIGH (ref 0.44–1.00)
GFR, Estimated: 5 mL/min — ABNORMAL LOW (ref >60)
GFR, Estimated: 6 mL/min — ABNORMAL LOW (ref >60)
Glucose, Bld: 112 mg/dL — ABNORMAL HIGH (ref 70–99)
Glucose, Bld: 127 mg/dL — ABNORMAL HIGH (ref 70–99)
Potassium: 4.3 mmol/L (ref 3.5–5.1)
Potassium: 4.9 mmol/L (ref 3.5–5.1)
Sodium: 139 mmol/L (ref 135–145)
Sodium: 140 mmol/L (ref 135–145)

## 2023-12-12 LAB — BRAIN NATRIURETIC PEPTIDE: B Natriuretic Peptide: 2354 pg/mL — ABNORMAL HIGH (ref 0.0–100.0)

## 2023-12-12 MED ORDER — ALBUTEROL SULFATE (2.5 MG/3ML) 0.083% IN NEBU
2.5000 mg | INHALATION_SOLUTION | Freq: Four times a day (QID) | RESPIRATORY_TRACT | Status: DC | PRN
  Administered 2023-12-13 (×2): 2.5 mg via RESPIRATORY_TRACT
  Filled 2023-12-12: qty 3

## 2023-12-12 MED ORDER — IPRATROPIUM-ALBUTEROL 0.5-2.5 (3) MG/3ML IN SOLN
3.0000 mL | Freq: Once | RESPIRATORY_TRACT | Status: AC
  Administered 2023-12-12 (×2): 3 mL via RESPIRATORY_TRACT
  Filled 2023-12-12: qty 3

## 2023-12-12 MED ORDER — HYDRALAZINE HCL 50 MG PO TABS
50.0000 mg | ORAL_TABLET | Freq: Two times a day (BID) | ORAL | Status: DC
  Administered 2023-12-13 – 2023-12-14 (×6): 50 mg via ORAL
  Filled 2023-12-12 (×4): qty 1

## 2023-12-12 MED ORDER — AMLODIPINE BESYLATE 5 MG PO TABS
10.0000 mg | ORAL_TABLET | Freq: Every day | ORAL | Status: DC
  Administered 2023-12-14 (×2): 10 mg via ORAL
  Filled 2023-12-12 (×2): qty 2

## 2023-12-12 MED ORDER — LORAZEPAM 1 MG PO TABS
1.0000 mg | ORAL_TABLET | Freq: Once | ORAL | Status: AC
  Administered 2023-12-12 (×2): 1 mg via ORAL
  Filled 2023-12-12: qty 1

## 2023-12-12 MED ORDER — LOSARTAN POTASSIUM 50 MG PO TABS
50.0000 mg | ORAL_TABLET | Freq: Every day | ORAL | Status: DC
  Administered 2023-12-14 (×2): 50 mg via ORAL
  Filled 2023-12-12: qty 1

## 2023-12-12 MED ORDER — FUROSEMIDE 10 MG/ML IJ SOLN
60.0000 mg | Freq: Two times a day (BID) | INTRAMUSCULAR | Status: DC
  Administered 2023-12-13 (×4): 60 mg via INTRAVENOUS
  Filled 2023-12-12 (×2): qty 6

## 2023-12-12 MED ORDER — PREDNISONE 20 MG PO TABS
20.0000 mg | ORAL_TABLET | Freq: Every day | ORAL | Status: DC
  Administered 2023-12-13 – 2023-12-15 (×5): 20 mg via ORAL
  Filled 2023-12-12 (×3): qty 1

## 2023-12-12 MED ORDER — MAGNESIUM HYDROXIDE 400 MG/5ML PO SUSP: 30.0000 mL | Freq: Every day | ORAL | Status: DC | PRN

## 2023-12-12 MED ORDER — FERROUS SULFATE 325 (65 FE) MG PO TABS
325.0000 mg | ORAL_TABLET | ORAL | Status: DC
  Administered 2023-12-13 – 2023-12-15 (×3): 325 mg via ORAL
  Filled 2023-12-12 (×2): qty 1

## 2023-12-12 MED ORDER — ACETAMINOPHEN 325 MG PO TABS: 650.0000 mg | ORAL_TABLET | Freq: Four times a day (QID) | ORAL | Status: DC | PRN

## 2023-12-12 MED ORDER — METHYLPREDNISOLONE SODIUM SUCC 125 MG IJ SOLR
80.0000 mg | Freq: Once | INTRAMUSCULAR | Status: AC
  Administered 2023-12-12 (×2): 80 mg via INTRAVENOUS
  Filled 2023-12-12: qty 2

## 2023-12-12 MED ORDER — DOXYCYCLINE HYCLATE 100 MG PO TABS
100.0000 mg | ORAL_TABLET | Freq: Two times a day (BID) | ORAL | Status: DC
  Administered 2023-12-13 – 2023-12-15 (×11): 100 mg via ORAL
  Filled 2023-12-12 (×6): qty 1

## 2023-12-12 MED ORDER — LORAZEPAM 2 MG/ML IJ SOLN: 1.0000 mg | Freq: Once | INTRAMUSCULAR | Status: DC

## 2023-12-12 MED ORDER — SODIUM CHLORIDE 0.9% FLUSH: 3.0000 mL | INTRAVENOUS | Status: DC | PRN

## 2023-12-12 MED ORDER — HEPARIN SODIUM (PORCINE) 5000 UNIT/ML IJ SOLN
5000.0000 [IU] | Freq: Three times a day (TID) | INTRAMUSCULAR | Status: DC
  Administered 2023-12-13 – 2023-12-15 (×13): 5000 [IU] via SUBCUTANEOUS
  Filled 2023-12-12 (×8): qty 1

## 2023-12-12 MED ORDER — METOPROLOL SUCCINATE ER 50 MG PO TB24
50.0000 mg | ORAL_TABLET | Freq: Every day | ORAL | Status: DC
  Administered 2023-12-14 (×2): 50 mg via ORAL
  Filled 2023-12-12 (×2): qty 1

## 2023-12-12 MED ORDER — ACETAMINOPHEN 650 MG RE SUPP: 650.0000 mg | Freq: Four times a day (QID) | RECTAL | Status: DC | PRN

## 2023-12-12 MED ORDER — ALBUTEROL SULFATE HFA 108 (90 BASE) MCG/ACT IN AERS: 2.0000 | INHALATION_SPRAY | Freq: Four times a day (QID) | RESPIRATORY_TRACT | Status: DC | PRN

## 2023-12-12 MED ORDER — SEVELAMER CARBONATE 800 MG PO TABS
2400.0000 mg | ORAL_TABLET | Freq: Three times a day (TID) | ORAL | Status: DC
  Administered 2023-12-13 – 2023-12-15 (×12): 2400 mg via ORAL
  Filled 2023-12-12 (×7): qty 3

## 2023-12-12 MED ORDER — TRAZODONE HCL 50 MG PO TABS
25.0000 mg | ORAL_TABLET | Freq: Every evening | ORAL | Status: DC | PRN
Start: 2023-12-12 — End: 2023-12-15

## 2023-12-12 NOTE — ED Provider Notes (Signed)
 [place] EMERGENCY DEPARTMENT AT [facility] Provider Note   CSN: [id number] Arrival date & time: 12/12/23  2115     Patient presents with: Shortness of Breath   Sheri Cooper is a 73 y.o. female.    Shortness of Breath Patient presented shortness of breath.  History of end-stage renal disease and COPD.  Not on oxygen.  Has been admitted twice this month and was seen in the ER yesterday for similar symptoms.  Thought to be both due to COPD and need for dialysis.  Was last dialyzed on Saturday with today being Monday.  States that they are to have difficulty finding the right weight for her with a new machine.  States she told the people yesterday she was still short of breath.  States she has however been short of breath most of the month.  States she was told it was anxiety yesterday.    Past Medical History:  Diagnosis Date   Anemia    Chronic kidney disease (CKD), stage IV (severe) (HCC)    COPD (chronic obstructive pulmonary disease) (HCC)    ESRD (end stage renal disease) (HCC)    Hemodialysis T, TH, SAT, [address] , [place]   GERD (gastroesophageal reflux disease)    History of blood transfusion    History of blood transfusion    Hypertension    Pneumonia    3 times in 2024-   PUD (peptic ulcer disease) 05/2022   Rheumatoid arthritis (HCC)    Rheumatoid arthritis (HCC)     Prior to Admission medications   Medication Sig Start Date End Date Taking? Authorizing Provider  albuterol  (VENTOLIN  HFA) 108 (90 Base) MCG/ACT inhaler Inhale 2 puffs into the lungs every 6 (six) hours as needed for wheezing or shortness of breath. 09/28/23   Maree, Pratik D, DO  amLODipine  (NORVASC ) 10 MG tablet Take 10 mg by mouth daily.    [provider]  doxycycline  (VIBRA -TABS) 100 MG tablet Take 1 tablet (100 mg total) by mouth every 12 (twelve) hours. 12/08/23   Evonnie Lenis, MD  ferrous sulfate  325 (65 FE) MG EC tablet Take 325 mg by mouth 3 (three) times a week. Tuesday,  Thursday, and Saturday at dialysis    [provider]  hydrALAZINE  (APRESOLINE ) 50 MG tablet Take 1 tablet (50 mg total) by mouth 2 (two) times daily. 09/15/23   Evonnie Lenis, MD  losartan  (COZAAR ) 50 MG tablet Take 1 tablet (50 mg total) by mouth daily. 09/16/23   Evonnie Lenis, MD  metoprolol  succinate (TOPROL -XL) 50 MG 24 hr tablet Take 50 mg by mouth daily. 10/15/22   [provider]  predniSONE  (DELTASONE ) 20 MG tablet Take 1 tablet (20 mg total) by mouth daily with breakfast. 12/09/23   Tat, Lenis, MD  sevelamer  carbonate (RENVELA ) 800 MG tablet Take 2,400 mg by mouth 3 (three) times daily with meals.    [provider]    Allergies: Patient has no known allergies.    Review of Systems  Respiratory:  Positive for shortness of breath.     Updated Vital Signs BP (!) 198/66 (BP Location: Right Arm)   Pulse 95   Temp 98.5 F (36.9 C) (Oral)   Resp 19   Ht 4' 11 (1.499 m)   Wt 44.5 kg   SpO2 94%   BMI 19.79 kg/m   Physical Exam Vitals reviewed.  HENT:     Head:     Comments: Does have some periorbital edema. Cardiovascular:  Rate and Rhythm: Tachycardia present.  Pulmonary:     Comments: Somewhat harsh breath sounds with tachypnea. Musculoskeletal:     Right lower leg: Edema present.     Left lower leg: Edema present.  Neurological:     Mental Status: She is alert.     (all labs ordered are listed, but only abnormal results are displayed) Labs Reviewed  BASIC METABOLIC PANEL WITH GFR - Abnormal; Notable for the following components:      Result Value   CO2 17 (*)    Glucose, Bld 127 (*)    BUN 72 (*)    Creatinine, Ser 7.81 (*)    Calcium  7.9 (*)    GFR, Estimated 5 (*)    Anion gap 19 (*)    All other components within normal limits    EKG: EKG Interpretation Date/Time:  Monday December 12 2023 21:29:04 EDT Ventricular Rate:  93 PR Interval:  114 QRS Duration:  79 QT Interval:  432 QTC Calculation: 538 R Axis:   47  Text  Interpretation: Sinus rhythm Borderline short PR interval Consider left ventricular hypertrophy Prolonged QT interval Confirmed by Patsey Lot [phone]) on 12/12/2023 10:46:04 PM  Radiology: [facility] Chest Portable 1 View Result Date: 12/12/2023 CLINICAL DATA:  Shortness of breath.  Bilateral feet numbness. EXAM: PORTABLE CHEST 1 VIEW COMPARISON:  Radiograph earlier today FINDINGS: Stable cardiomegaly. Unchanged mediastinal contours. Unchanged bilateral pleural effusions, left greater than right with associated bibasilar opacities. Interstitial thickening suspicious for pulmonary edema, also unchanged. No pneumothorax. Left breast/axillary surgical clips. IMPRESSION: 1. Unchanged bilateral pleural effusions, left greater than right with associated bibasilar opacities. 2. Stable cardiomegaly. Unchanged interstitial thickening suspicious for pulmonary edema. Electronically Signed   By: Andrea Gasman M.D.   On: 12/12/2023 22:57   DG Chest Portable 1 View Result Date: 12/12/2023 CLINICAL DATA:  Shortness of breath and decreased breath sounds. EXAM: PORTABLE CHEST 1 VIEW COMPARISON:  Portable chest 12/05/2023 FINDINGS: Moderate pleural effusions on the left greater than right continue to be seen, not significantly changed. Stable mild cardiomegaly, mild lower zonal interstitial edema and central vascular prominence. There are bibasilar opacities overlying the pleural effusions which could be atelectasis or consolidation and are also unchanged. The remaining lungs appear clear. The mediastinum is normally outlined. Aortic atherosclerosis. No new osseous finding.  Left axillary surgical clips. IMPRESSION: 1. No significant change in the appearance of the chest from 1 week ago. 2. Moderate pleural effusions on the left greater than right, with overlying atelectasis or consolidation. 3. Stable cardiomegaly and mild interstitial edema. Electronically Signed   By: Francis Quam M.D.   On: 12/12/2023 00:59      Procedures   Medications Ordered in the ED  ipratropium-albuterol  (DUONEB) 0.5-2.5 (3) MG/3ML nebulizer solution 3 mL (3 mLs Nebulization Given 12/12/23 2216)  LORazepam  (ATIVAN ) tablet 1 mg (1 mg Oral Given 12/12/23 2154)                                    Medical Decision Making Amount and/or Complexity of Data Reviewed Labs: ordered. Radiology: ordered.  Risk Prescription drug management.   Shortness of breath history of same.  Differential diagnosis includes COPD CHF hypertension given some anxiety.  Due for dialysis tomorrow.  Not hypoxic but is dyspneic.  Blood pressure somewhat elevated.  Will get x-ray.  Reviewed note from yesterday.  Reviewed blood work from earlier today.  Will repeat the  BMP but do not think the CBC needs to be repeated at this time.  X-ray does show potential stable edema.  Resting more comfortably but I think with edema recurrent shortness of breath patient benefit from admission in the hospital.  Will discuss with hospitalist.  Potentially due to volume overload or COPD.       Final diagnoses:  SOB (shortness of breath)  End stage renal disease on dialysis [facility])    ED Discharge Orders     None          Patsey Lot, MD 12/12/23 2301

## 2023-12-12 NOTE — ED Notes (Signed)
 EDP at bedside. Kellogg RN

## 2023-12-12 NOTE — ED Triage Notes (Signed)
 Pt bib EMS after she called out for SOB. EMS reports O2 sats of 98-99% on R/A upon their arrival and some diminished breath sounds. Pt given 1 Albuterol  neb en route. Pt reportedly seen here within the last few days for same complaints and sent home with some prescriptions which hasn't been able to pick up yet.

## 2023-12-12 NOTE — ED Provider Notes (Signed)
 [place] EMERGENCY DEPARTMENT AT [facility] Provider Note   CSN: [id number] Arrival date & time: 12/11/23  2356     Patient presents with: Shortness of Breath   Sheri Cooper is a 73 y.o. female.   Patient is a 73 year old female with history of COPD, end-stage renal disease on hemodialysis, hypertension, rheumatoid.  Patient presenting today with complaints of shortness of breath.  She has been here several times recently with similar complaints.  She was just discharged a few days ago.  She reports going to dialysis on Saturday and the medications that she was prescribed while in the hospital were attempted to be delivered while she was at dialysis, so she did not receive them.       Prior to Admission medications   Medication Sig Start Date End Date Taking? Authorizing Provider  albuterol  (VENTOLIN  HFA) 108 (90 Base) MCG/ACT inhaler Inhale 2 puffs into the lungs every 6 (six) hours as needed for wheezing or shortness of breath. 09/28/23   Maree, Pratik D, DO  amLODipine  (NORVASC ) 10 MG tablet Take 10 mg by mouth daily.    [provider]  doxycycline  (VIBRA -TABS) 100 MG tablet Take 1 tablet (100 mg total) by mouth every 12 (twelve) hours. 12/08/23   Evonnie Lenis, MD  ferrous sulfate  325 (65 FE) MG EC tablet Take 325 mg by mouth 3 (three) times a week. Tuesday, Thursday, and Saturday at dialysis    [provider]  hydrALAZINE  (APRESOLINE ) 50 MG tablet Take 1 tablet (50 mg total) by mouth 2 (two) times daily. 09/15/23   Evonnie Lenis, MD  losartan  (COZAAR ) 50 MG tablet Take 1 tablet (50 mg total) by mouth daily. 09/16/23   Evonnie Lenis, MD  metoprolol  succinate (TOPROL -XL) 50 MG 24 hr tablet Take 50 mg by mouth daily. 10/15/22   [provider]  predniSONE  (DELTASONE ) 20 MG tablet Take 1 tablet (20 mg total) by mouth daily with breakfast. 12/09/23   Tat, Lenis, MD  sevelamer  carbonate (RENVELA ) 800 MG tablet Take 2,400 mg by mouth 3 (three) times daily  with meals.    [provider]    Allergies: Patient has no known allergies.    Review of Systems  All other systems reviewed and are negative.   Updated Vital Signs BP (!) 174/22   Pulse 88   Temp 98.1 F (36.7 C) (Oral)   Resp (!) 23   Ht 4' 11 (1.499 m)   Wt 40 kg   SpO2 97%   BMI 17.81 kg/m   Physical Exam Vitals and nursing note reviewed.  Constitutional:      General: She is not in acute distress.    Appearance: She is well-developed. She is not diaphoretic.  HENT:     Head: Normocephalic and atraumatic.  Cardiovascular:     Rate and Rhythm: Normal rate and regular rhythm.     Heart sounds: No murmur heard.    No friction rub. No gallop.  Pulmonary:     Effort: Pulmonary effort is normal. No respiratory distress.     Breath sounds: Examination of the right-middle field reveals rhonchi. Examination of the left-middle field reveals rhonchi. Rhonchi present. No wheezing.  Abdominal:     General: Bowel sounds are normal. There is no distension.     Palpations: Abdomen is soft.     Tenderness: There is no abdominal tenderness.  Musculoskeletal:        General: Normal range of motion.     Cervical  back: Normal range of motion and neck supple.  Skin:    General: Skin is warm and dry.  Neurological:     General: No focal deficit present.     Mental Status: She is alert and oriented to person, place, and time.     (all labs ordered are listed, but only abnormal results are displayed) Labs Reviewed  BASIC METABOLIC PANEL WITH GFR - Abnormal; Notable for the following components:      Result Value   CO2 18 (*)    Glucose, Bld 112 (*)    BUN 52 (*)    Creatinine, Ser 6.53 (*)    Calcium  7.9 (*)    GFR, Estimated 6 (*)    Anion gap 17 (*)    All other components within normal limits  CBC - Abnormal; Notable for the following components:   WBC 18.1 (*)    RBC 3.48 (*)    Hemoglobin 9.9 (*)    HCT 29.6 (*)    RDW 19.9 (*)    All other components  within normal limits  BRAIN NATRIURETIC PEPTIDE - Abnormal; Notable for the following components:   B Natriuretic Peptide 2,354.0 (*)    All other components within normal limits    EKG: EKG Interpretation Date/Time:  Monday December 12 2023 00:08:05 EDT Ventricular Rate:  91 PR Interval:  115 QRS Duration:  95 QT Interval:  435 QTC Calculation: 536 R Axis:   52  Text Interpretation: Sinus rhythm Borderline short PR interval Probable left ventricular hypertrophy Prolonged QT interval No significant change since 12/05/2023 Confirmed by Geroldine Berg ([id number]) on 12/12/2023 12:19:43 AM  Radiology: [facility] Chest Portable 1 View Result Date: 12/12/2023 CLINICAL DATA:  Shortness of breath and decreased breath sounds. EXAM: PORTABLE CHEST 1 VIEW COMPARISON:  Portable chest 12/05/2023 FINDINGS: Moderate pleural effusions on the left greater than right continue to be seen, not significantly changed. Stable mild cardiomegaly, mild lower zonal interstitial edema and central vascular prominence. There are bibasilar opacities overlying the pleural effusions which could be atelectasis or consolidation and are also unchanged. The remaining lungs appear clear. The mediastinum is normally outlined. Aortic atherosclerosis. No new osseous finding.  Left axillary surgical clips. IMPRESSION: 1. No significant change in the appearance of the chest from 1 week ago. 2. Moderate pleural effusions on the left greater than right, with overlying atelectasis or consolidation. 3. Stable cardiomegaly and mild interstitial edema. Electronically Signed   By: Francis Quam M.D.   On: 12/12/2023 00:59     Procedures   Medications Ordered in the ED  methylPREDNISolone  sodium succinate (SOLU-MEDROL ) 125 mg/2 mL injection 80 mg (has no administration in time range)  ipratropium-albuterol  (DUONEB) 0.5-2.5 (3) MG/3ML nebulizer solution 3 mL (has no administration in time range)  ipratropium-albuterol  (DUONEB) 0.5-2.5 (3) MG/3ML  nebulizer solution 3 mL (has no administration in time range)                                    Medical Decision Making Amount and/or Complexity of Data Reviewed Labs: ordered. Radiology: ordered.  Risk Prescription drug management.   Patient is a 73 year old female presenting with shortness of breath.  She has history of COPD and end-stage renal disease on hemodialysis.  Patient arrives here with stable vital signs and is afebrile.  Oxygen saturations are 97%.  Laboratory studies obtained including CBC and basic metabolic panel.  She has white count of  18,000 and electrolytes are consistent with her being on dialysis.  BNP is 2300.  Chest x-ray shows no significant change in the appearance of the chest x-ray from several days ago.  She has received breathing treatments along with Solu-Medrol  and seems to be feeling better.  Patient seems appropriate for discharge.  To return as needed.     Final diagnoses:  None    ED Discharge Orders     None          Geroldine Berg, MD 12/12/23 [id number]

## 2023-12-12 NOTE — ED Notes (Signed)
 ED Provider at bedside.

## 2023-12-12 NOTE — ED Notes (Addendum)
 Reached out to Dr. Geroldine, Patient stated I do not want to be admitted. informed transport to take her home. Then Patient stated I can't breath, I need to you to give me something to help me breath, Informed patient that I could take her back inside and put her back in a room. Patient stated I have been admitted three times and don't want to be admitted, noting that yall have given me has helped, not the meds or the dialysis. Patient informed transport to take her home one more time. This Nurse informed patient I can take you back inside if you want to be seen again, Patient declined to get out of transport. Patient then Informed this nurse I will be back, I'll get EMS to bring me back. MD made aware.

## 2023-12-12 NOTE — ED Notes (Signed)
 Pt called out stating she cannot breathe. Reassured her oxygen sats are WNL and encouraged to utilize calming techniques.

## 2023-12-12 NOTE — ED Notes (Signed)
 Patient is resting comfortably.

## 2023-12-12 NOTE — Discharge Instructions (Signed)
 Resume taking medications as previously prescribed.  Fill your prescriptions and begin taking these.  Continue dialysis as previously scheduled.  Return to the ER for any new and/or concerning issues.

## 2023-12-12 NOTE — ED Notes (Signed)
 Pt wheeled outside for ride home by [employer] [facility]. Pt then stated she  couldn't breathe and wanted to check back in. Pt brought back to room and Dr Geroldine notified.

## 2023-12-12 NOTE — H&P (Addendum)
 [place]   PATIENT NAME: Sheri Cooper    MR#:  [id number]  DATE OF BIRTH:  08-06-1950  DATE OF ADMISSION:  12/12/2023  PRIMARY CARE PHYSICIAN: Toribio Jerel MATSU, MD   Patient is coming from: Home  REQUESTING/REFERRING PHYSICIAN: Patsey Lot, MD  CHIEF COMPLAINT:   Chief Complaint  Patient presents with   Shortness of Breath    HISTORY OF PRESENT ILLNESS:  Sheri Cooper is a 73 y.o. African-American female with medical history significant for COPD, end-stage renal disease on hemodialysis, GERD, hypertension, rheumatoid arthritis, PUD, and anemia, who presented to the emergency room with acute onset of worsening dyspnea with associated orthopnea, paroxysmal nocturnal dyspnea and dyspnea on exertion.  She admits to dry cough with occasional expectoration of clear sputum as well as occasional wheezing.  She denied any fever or chills.  No chest pain or palpitations.  No nausea or vomiting or abdominal pain.  ED Course: When she came to the ER, respiratory rate was 23 and BP 174/22 with otherwise normal vital signs.  Labs revealed a CO2 of 18 and BUN of 52 with a creatinine of 6.53 and calcium  7.9 with anion gap of 17.  BNP was 2354 and CBC showed leukocytosis of 18.1 with anemia. EKG as reviewed by me : EKG showed normal sinus rhythm with a rate of 93 with prolonged QT interval with QTc of 538 mL. Imaging: Portable chest x-ray showed stable cardiomegaly with interstitial thickening suspicious for pulmonary edema that is unchanged from earlier during the day.  It showed unchanged bilateral pleural effusions, left greater than right with associated by basilar opacities.  The patient was given DuoNeb 1 mg of p.o. Ativan  and will be admitted to a medical telemetry observation bed for further evaluation and management. PAST MEDICAL HISTORY:   Past Medical History:  Diagnosis Date   Anemia    Chronic kidney disease (CKD), stage IV (severe) (HCC)    COPD (chronic  obstructive pulmonary disease) (HCC)    ESRD (end stage renal disease) (HCC)    Hemodialysis T, TH, SAT, [address] , [place]   GERD (gastroesophageal reflux disease)    History of blood transfusion    History of blood transfusion    Hypertension    Pneumonia    3 times in 2024-   PUD (peptic ulcer disease) 05/2022   Rheumatoid arthritis (HCC)    Rheumatoid arthritis (HCC)     PAST SURGICAL HISTORY:   Past Surgical History:  Procedure Laterality Date   A/V SHUNTOGRAM N/A 05/02/2023   Procedure: A/V SHUNTOGRAM;  Surgeon: Pearline Norman GORMAN, MD;  [facility] INVASIVE CV LAB;  Service: Cardiovascular;  Laterality: N/A;   AV FISTULA PLACEMENT Left 11/29/2022   Procedure: INSERTION OF LEFT UPPER  ARTERIOVENOUS (AV) GORE-TEX GRAFT ARM;  Surgeon: Lanis Fonda BRAVO, MD;  Location: [facility] OR;  Service: Vascular;  Laterality: Left;   CENTRAL VENOUS CATHETER INSERTION Left 05/27/2022   Procedure: INSERTION CENTRAL LINE ADULT;  Surgeon: Kallie Manuelita BROCKS, MD;  Location: [facility] ORS;  Service: General;  Laterality: Left;   COLONOSCOPY WITH PROPOFOL  N/A 05/22/2022   Surgeon: Eartha Angelia Toribio, MD; blood in the terminal ileum and colon.   ESOPHAGOGASTRODUODENOSCOPY (EGD) WITH PROPOFOL  N/A 05/22/2022   Surgeon: Eartha Angelia, Toribio, MD; peptic ulcer disease with 2 nonbleeding cratered gastric ulcers, multiple duodenal ulcers, 2 of which had adherent clots that were removed and revealed 2 vessels.  1 vessel was clipped, cauterized, and treated with epinephrine  injection.  Second vessel was clipped and treated with cautery, clip fell off.   IR FLUORO GUIDE CV LINE RIGHT  05/24/2022   IR US  GUIDE VASC ACCESS RIGHT  05/24/2022   LAPAROTOMY N/A 05/27/2022   Procedure: EXPLORATORY LAPAROTOMY, OMENTAL PATH, DRAIN PLACEMENT;  Surgeon: Kallie Manuelita BROCKS, MD;  Location: [facility] ORS;  Service: General;  Laterality: N/A;   PERIPHERAL VASCULAR BALLOON ANGIOPLASTY  05/02/2023   Procedure: PERIPHERAL VASCULAR BALLOON  ANGIOPLASTY;  Surgeon: Pearline Norman RAMAN, MD;  Location: [facility] INVASIVE CV LAB;  Service: Cardiovascular;;   PERIPHERAL VASCULAR THROMBECTOMY Left 08/18/2023   Procedure: PERIPHERAL VASCULAR THROMBECTOMY;  Surgeon: Melia Lynwood ORN, MD;  Location: [facility] INVASIVE CV LAB;  Service: Cardiovascular;  Laterality: Left;   VENOUS ANGIOPLASTY Left 08/18/2023   Procedure: VENOUS ANGIOPLASTY;  Surgeon: Melia Lynwood ORN, MD;  Location: [facility] INVASIVE CV LAB;  Service: Cardiovascular;  Laterality: Left;  80% Intragraft; 50% Venous Anastomosis   VOLVULUS REDUCTION     colon resection    SOCIAL HISTORY:   Social History   Tobacco Use   Smoking status: Former    Types: Cigarettes    Passive exposure: Past   Smokeless tobacco: Never  Substance Use Topics   Alcohol use: Not Currently    FAMILY HISTORY:  History reviewed. No pertinent family history.  DRUG ALLERGIES:  No Known Allergies  REVIEW OF SYSTEMS:   ROS As per history of present illness. All pertinent systems were reviewed above. Constitutional, HEENT, cardiovascular, respiratory, GI, GU, musculoskeletal, neuro, psychiatric, endocrine, integumentary and hematologic systems were reviewed and are otherwise negative/unremarkable except for positive findings mentioned above in the HPI.   MEDICATIONS AT HOME:   Prior to Admission medications   Medication Sig Start Date End Date Taking? Authorizing Provider  albuterol  (VENTOLIN  HFA) 108 (90 Base) MCG/ACT inhaler Inhale 2 puffs into the lungs every 6 (six) hours as needed for wheezing or shortness of breath. 09/28/23   Maree, Pratik D, DO  amLODipine  (NORVASC ) 10 MG tablet Take 10 mg by mouth daily.    [provider]  doxycycline  (VIBRA -TABS) 100 MG tablet Take 1 tablet (100 mg total) by mouth every 12 (twelve) hours. 12/08/23   Evonnie Lenis, MD  ferrous sulfate  325 (65 FE) MG EC tablet Take 325 mg by mouth 3 (three) times a week. Tuesday, Thursday, and Saturday at dialysis    [provider]   hydrALAZINE  (APRESOLINE ) 50 MG tablet Take 1 tablet (50 mg total) by mouth 2 (two) times daily. 09/15/23   Evonnie Lenis, MD  losartan  (COZAAR ) 50 MG tablet Take 1 tablet (50 mg total) by mouth daily. 09/16/23   Evonnie Lenis, MD  metoprolol  succinate (TOPROL -XL) 50 MG 24 hr tablet Take 50 mg by mouth daily. 10/15/22   [provider]  predniSONE  (DELTASONE ) 20 MG tablet Take 1 tablet (20 mg total) by mouth daily with breakfast. 12/09/23   Tat, Lenis, MD  sevelamer  carbonate (RENVELA ) 800 MG tablet Take 2,400 mg by mouth 3 (three) times daily with meals.    [provider]      VITAL SIGNS:  Blood pressure (!) 185/68, pulse 97, temperature 98.8 F (37.1 C), temperature source Oral, resp. rate 17, height 4' 11 (1.499 m), weight 44.5 kg, SpO2 94%.  PHYSICAL EXAMINATION:  Physical Exam  GENERAL:  73 y.o.-year-old patient lying in the bed with no acute distress.  EYES: Pupils equal, round, reactive to light and accommodation. No scleral icterus. Extraocular muscles intact.  HEENT: Head atraumatic, normocephalic. Oropharynx and  nasopharynx clear.  NECK:  Supple, no jugular venous distention. No thyroid  enlargement, no tenderness.  LUNGS: Diminished bibasilar breath sounds with bibasilar rales.. No use of accessory muscles of respiration.  CARDIOVASCULAR: Regular rate and rhythm, S1, S2 normal. No murmurs, rubs, or gallops.  ABDOMEN: Soft, nondistended, nontender. Bowel sounds present. No organomegaly or mass.  EXTREMITIES: No pedal edema, cyanosis, or clubbing.  NEUROLOGIC: Cranial nerves II through XII are intact. Muscle strength 5/5 in all extremities. Sensation intact. Gait not checked.  PSYCHIATRIC: The patient is alert and oriented x 3.  Normal affect and good eye contact. SKIN: No obvious rash, lesion, or ulcer.   LABORATORY PANEL:   CBC Recent Labs  Lab 12/12/23 0025  WBC 18.1*  HGB 9.9*  HCT 29.6*  PLT 316    ------------------------------------------------------------------------------------------------------------------  Chemistries  Recent Labs  Lab 12/12/23 2201  NA 140  K 4.9  CL 104  CO2 17*  GLUCOSE 127*  BUN 72*  CREATININE 7.81*  CALCIUM  7.9*   ------------------------------------------------------------------------------------------------------------------  Cardiac Enzymes No results for input(s): TROPONINI in the last 168 hours. ------------------------------------------------------------------------------------------------------------------  RADIOLOGY:  DG Chest Portable 1 View Result Date: 12/12/2023 CLINICAL DATA:  Shortness of breath.  Bilateral feet numbness. EXAM: PORTABLE CHEST 1 VIEW COMPARISON:  Radiograph earlier today FINDINGS: Stable cardiomegaly. Unchanged mediastinal contours. Unchanged bilateral pleural effusions, left greater than right with associated bibasilar opacities. Interstitial thickening suspicious for pulmonary edema, also unchanged. No pneumothorax. Left breast/axillary surgical clips. IMPRESSION: 1. Unchanged bilateral pleural effusions, left greater than right with associated bibasilar opacities. 2. Stable cardiomegaly. Unchanged interstitial thickening suspicious for pulmonary edema. Electronically Signed   By: Andrea Gasman M.D.   On: 12/12/2023 22:57   DG Chest Portable 1 View Result Date: 12/12/2023 CLINICAL DATA:  Shortness of breath and decreased breath sounds. EXAM: PORTABLE CHEST 1 VIEW COMPARISON:  Portable chest 12/05/2023 FINDINGS: Moderate pleural effusions on the left greater than right continue to be seen, not significantly changed. Stable mild cardiomegaly, mild lower zonal interstitial edema and central vascular prominence. There are bibasilar opacities overlying the pleural effusions which could be atelectasis or consolidation and are also unchanged. The remaining lungs appear clear. The mediastinum is normally outlined. Aortic  atherosclerosis. No new osseous finding.  Left axillary surgical clips. IMPRESSION: 1. No significant change in the appearance of the chest from 1 week ago. 2. Moderate pleural effusions on the left greater than right, with overlying atelectasis or consolidation. 3. Stable cardiomegaly and mild interstitial edema. Electronically Signed   By: Francis Quam M.D.   On: 12/12/2023 00:59      IMPRESSION AND PLAN:  Assessment and Plan: * Fluid overload - This is associated with end-stage renal disease on hemodialysis. - The patient will be admitted to the medical telemetry observation bed. - Will place her on diuresis for pulmonary congestion with IV Lasix . - Nephrology consult will be obtainedFor hemodialysis in AM. - I notified Dr. Dolan and Norine about the patient.  Acute on chronic heart failure with preserved ejection fraction (HCC) - Management as above.  Essential hypertension - Will continue antihypertensive therapy.  COPD with acute exacerbation (HCC) - She was recently given doxycycline  and prednisone  taper for recent exacerbation.  Both will be resumed for now. - The patient will be placed on scheduled and as needed DuoNebs.   DVT prophylaxis: Subcu heparin . Advanced Care Planning:  Code Status: full code.  Family Communication:  The plan of care was discussed in details with the patient (and family). I answered  all questions. The patient agreed to proceed with the above mentioned plan. Further management will depend upon hospital course. Disposition Plan: Back to previous home environment Consults called: Nephrology. All the records are reviewed and case discussed with ED provider.  Status is: Observation  I certify that at the time of admission, it is my clinical judgment that the patient will require  hospital care extending less than 2 midnights.                            Dispo: The patient is from: Home              Anticipated d/c is to: Home               Patient currently is not medically stable to d/c.              Difficult to place patient: No  Madison DELENA Peaches M.D on 12/13/2023 at 3:59 AM  [facility]   From 7 PM-7 AM, contact night-coverage [email or web address]  CC: Primary care physician; Toribio Jerel MATSU, MD

## 2023-12-12 NOTE — ED Triage Notes (Signed)
 Pt was at home experienced shortness of breath; took two breathing treatments with no relief. Pt hypertensive and sinus tach with [facility]. Pt is dialysis pt.

## 2023-12-13 ENCOUNTER — Telehealth: Payer: Self-pay | Admitting: *Deleted

## 2023-12-13 ENCOUNTER — Encounter (HOSPITAL_COMMUNITY): Payer: Self-pay | Admitting: Family Medicine

## 2023-12-13 DIAGNOSIS — J90 Pleural effusion, not elsewhere classified: Secondary | ICD-10-CM | POA: Diagnosis not present

## 2023-12-13 DIAGNOSIS — Z7952 Long term (current) use of systemic steroids: Secondary | ICD-10-CM | POA: Diagnosis not present

## 2023-12-13 DIAGNOSIS — E8779 Other fluid overload: Secondary | ICD-10-CM

## 2023-12-13 DIAGNOSIS — D631 Anemia in chronic kidney disease: Secondary | ICD-10-CM | POA: Diagnosis not present

## 2023-12-13 DIAGNOSIS — N2581 Secondary hyperparathyroidism of renal origin: Secondary | ICD-10-CM | POA: Diagnosis not present

## 2023-12-13 DIAGNOSIS — I517 Cardiomegaly: Secondary | ICD-10-CM | POA: Diagnosis not present

## 2023-12-13 DIAGNOSIS — F419 Anxiety disorder, unspecified: Secondary | ICD-10-CM | POA: Diagnosis present

## 2023-12-13 DIAGNOSIS — J918 Pleural effusion in other conditions classified elsewhere: Secondary | ICD-10-CM | POA: Diagnosis present

## 2023-12-13 DIAGNOSIS — Z87891 Personal history of nicotine dependence: Secondary | ICD-10-CM | POA: Diagnosis not present

## 2023-12-13 DIAGNOSIS — E8770 Fluid overload, unspecified: Secondary | ICD-10-CM | POA: Diagnosis present

## 2023-12-13 DIAGNOSIS — J441 Chronic obstructive pulmonary disease with (acute) exacerbation: Secondary | ICD-10-CM | POA: Diagnosis not present

## 2023-12-13 DIAGNOSIS — Z79899 Other long term (current) drug therapy: Secondary | ICD-10-CM | POA: Diagnosis not present

## 2023-12-13 DIAGNOSIS — I10 Essential (primary) hypertension: Secondary | ICD-10-CM | POA: Diagnosis not present

## 2023-12-13 DIAGNOSIS — Z992 Dependence on renal dialysis: Secondary | ICD-10-CM | POA: Diagnosis not present

## 2023-12-13 DIAGNOSIS — D72829 Elevated white blood cell count, unspecified: Secondary | ICD-10-CM | POA: Diagnosis present

## 2023-12-13 DIAGNOSIS — J9601 Acute respiratory failure with hypoxia: Secondary | ICD-10-CM | POA: Diagnosis present

## 2023-12-13 DIAGNOSIS — I5033 Acute on chronic diastolic (congestive) heart failure: Secondary | ICD-10-CM

## 2023-12-13 DIAGNOSIS — K219 Gastro-esophageal reflux disease without esophagitis: Secondary | ICD-10-CM | POA: Diagnosis present

## 2023-12-13 DIAGNOSIS — M069 Rheumatoid arthritis, unspecified: Secondary | ICD-10-CM | POA: Diagnosis present

## 2023-12-13 DIAGNOSIS — N186 End stage renal disease: Secondary | ICD-10-CM | POA: Diagnosis not present

## 2023-12-13 DIAGNOSIS — I132 Hypertensive heart and chronic kidney disease with heart failure and with stage 5 chronic kidney disease, or end stage renal disease: Secondary | ICD-10-CM | POA: Diagnosis present

## 2023-12-13 DIAGNOSIS — Z8711 Personal history of peptic ulcer disease: Secondary | ICD-10-CM | POA: Diagnosis not present

## 2023-12-13 DIAGNOSIS — I700 Atherosclerosis of aorta: Secondary | ICD-10-CM | POA: Diagnosis not present

## 2023-12-13 DIAGNOSIS — I120 Hypertensive chronic kidney disease with stage 5 chronic kidney disease or end stage renal disease: Secondary | ICD-10-CM | POA: Diagnosis not present

## 2023-12-13 DIAGNOSIS — R0602 Shortness of breath: Secondary | ICD-10-CM | POA: Diagnosis not present

## 2023-12-13 DIAGNOSIS — Z48813 Encounter for surgical aftercare following surgery on the respiratory system: Secondary | ICD-10-CM | POA: Diagnosis not present

## 2023-12-13 DIAGNOSIS — Z1152 Encounter for screening for COVID-19: Secondary | ICD-10-CM | POA: Diagnosis not present

## 2023-12-13 LAB — CBC
HCT: 26.9 % — ABNORMAL LOW (ref 36.0–46.0)
Hemoglobin: 8.9 g/dL — ABNORMAL LOW (ref 12.0–15.0)
MCH: 28.4 pg (ref 26.0–34.0)
MCHC: 33.1 g/dL (ref 30.0–36.0)
MCV: 85.9 fL (ref 80.0–100.0)
Platelets: 316 K/uL (ref 150–400)
RBC: 3.13 MIL/uL — ABNORMAL LOW (ref 3.87–5.11)
RDW: 20.7 % — ABNORMAL HIGH (ref 11.5–15.5)
WBC: 13.1 K/uL — ABNORMAL HIGH (ref 4.0–10.5)
nRBC: 0 % (ref 0.0–0.2)

## 2023-12-13 LAB — BASIC METABOLIC PANEL WITH GFR
Anion gap: 15 (ref 5–15)
BUN: 77 mg/dL — ABNORMAL HIGH (ref 8–23)
CO2: 17 mmol/L — ABNORMAL LOW (ref 22–32)
Calcium: 7.7 mg/dL — ABNORMAL LOW (ref 8.9–10.3)
Chloride: 107 mmol/L (ref 98–111)
Creatinine, Ser: 7.97 mg/dL — ABNORMAL HIGH (ref 0.44–1.00)
GFR, Estimated: 5 mL/min — ABNORMAL LOW (ref >60)
Glucose, Bld: 118 mg/dL — ABNORMAL HIGH (ref 70–99)
Potassium: 4.6 mmol/L (ref 3.5–5.1)
Sodium: 139 mmol/L (ref 135–145)

## 2023-12-13 MED ORDER — CHLORHEXIDINE GLUCONATE CLOTH 2 % EX PADS
6.0000 | MEDICATED_PAD | Freq: Every day | CUTANEOUS | Status: DC
Start: 2023-12-13 — End: 2023-12-15
  Administered 2023-12-13 – 2023-12-15 (×5): 6 via TOPICAL

## 2023-12-13 MED ORDER — IPRATROPIUM-ALBUTEROL 0.5-2.5 (3) MG/3ML IN SOLN
3.0000 mL | Freq: Four times a day (QID) | RESPIRATORY_TRACT | Status: DC
  Administered 2023-12-13 (×4): 3 mL via RESPIRATORY_TRACT
  Filled 2023-12-13 (×3): qty 3

## 2023-12-13 NOTE — Progress Notes (Signed)
 Heart Failure Navigator Progress Note  Assessed for Heart & Vascular TOC clinic readiness.  Unfortunately, patient does not meet criteria due to ESRD on Hemodialysis.   Navigator will sign off at this time.  Charmaine Pines, RN, BSN [facility] Heart Failure Navigator Secure Chat Only

## 2023-12-13 NOTE — Consult Note (Signed)
 ESRD Consult Note  Requesting provider: Afton Louder Service requesting consult: Hospitalist Reason for consult: ESRD, provision of dialysis Indication for acute dialysis?: End Stage Renal Disease  Outpatient dialysis unit: [facility] [place] Outpatient dialysis prescription:   Assessment/Recommendations: Sheri Cooper is a/an 73 y.o. female with a past medical history notable for ESRD on HD admitted with shortness of breath   # ESRD: HD today on TTS schedule. Dialysis prescription adjusted based on labs.    # Volume/ hypertension: frequent problems with volume overload.  Dialysis with ultrafiltration today - will try 3L UF and standing post weight.  Sounds like EDW isn't the issue, it's tolerating UF so we'll have to make sure she can get profile 2 with new HD machine outpt. Continue home blood pressure medications.  Does have a mod L pleural effusion - may need to further eval/tx if dyspnea continues.   # Anemia of Chronic Kidney Disease:  Hb 8-9s. Monitor for now.   # Secondary Hyperparathyroidism/Hyperphosphatemia: calcium  corrects to normal.  Obtain phosphorus level.continue home sevelamer .  # Vascular access: AVG with no issues  # Additional recommendations: - Dose all meds for creatinine clearance < 10 ml/min  - Unless absolutely necessary, no MRIs with gadolinium.  - Implement save arm precautions.  Prefer needle sticks in the dorsum of the hands or wrists.  No blood pressure measurements in arm. - If blood transfusion is requested during hemodialysis sessions, please alert us  prior to the session.  - Use synthetic opioids (Fentanyl /Dilaudid ) if needed  Recommendations were discussed with the primary team.   History of Present Illness: Sheri Cooper is a/an 73 y.o. female with a past medical history of ESRD who presents with chief complaint of shortness of breath  Patient states she has had chronic problems with shortness of breath but they have been worse recently.   She has frequently come to the hospital for dialysis because of shortness of breath.  She states sometimes dialysis in the outpatient setting is difficult and will make her feel sick so she cannot get the whole treatment. SHe was admitted last week and underwent HD here x several treatments and per notes she felt remarkably better.  There are no comments on changes to EDW.  She returned because of persistent and worsening shortness of breath and was admitted overnight.  She says her HD unit got new machines and Sat was 1st tx on this - no UF profile set and she didn't tolerate tx, came off early.  CXR showing BL pleural effusions, L >R (R is small) and pulm edema.  She's been afebrile, HTN with BP 170-190s, sats 97% on RA but on walking to bathroom this AM had acute dyspnea which improved with rest.  O2 sats did not drop.     Medications:  Current Facility-Administered Medications  Medication Dose Route Frequency Provider Last Rate Last Admin   acetaminophen  (TYLENOL ) tablet 650 mg  650 mg Oral Q6H PRN Mansy, Jan A, MD       Or   acetaminophen  (TYLENOL ) suppository 650 mg  650 mg Rectal Q6H PRN Mansy, Jan A, MD       albuterol  (PROVENTIL ) (2.5 MG/3ML) 0.083% nebulizer solution 2.5 mg  2.5 mg Nebulization Q6H PRN Mansy, Jan A, MD   2.5 mg at 12/13/23 0720   amLODipine  (NORVASC ) tablet 10 mg  10 mg Oral Daily Mansy, Jan A, MD       doxycycline  (VIBRA -TABS) tablet 100 mg  100 mg Oral Q12H Mansy, Jan A,  MD   100 mg at 12/13/23 0118   [START ON 12/14/2023] ferrous sulfate  EC tablet 325 mg  325 mg Oral Once per day on Monday Wednesday Friday Mansy, Jan A, MD       furosemide  (LASIX ) injection 60 mg  60 mg Intravenous Q12H Mansy, Jan A, MD   60 mg at 12/13/23 0026   heparin  injection 5,000 Units  5,000 Units Subcutaneous Q8H Mansy, Jan A, MD   5,000 Units at 12/13/23 0559   hydrALAZINE  (APRESOLINE ) tablet 50 mg  50 mg Oral BID Mansy, Jan A, MD   50 mg at 12/13/23 0118   ipratropium-albuterol  (DUONEB)  0.5-2.5 (3) MG/3ML nebulizer solution 3 mL  3 mL Nebulization QID Mansy, Jan A, MD       losartan  (COZAAR ) tablet 50 mg  50 mg Oral Daily Mansy, Jan A, MD       magnesium  hydroxide (MILK OF MAGNESIA) suspension 30 mL  30 mL Oral Daily PRN Mansy, Jan A, MD       metoprolol  succinate (TOPROL -XL) 24 hr tablet 50 mg  50 mg Oral Daily Mansy, Jan A, MD       predniSONE  (DELTASONE ) tablet 20 mg  20 mg Oral Q breakfast Mansy, Jan A, MD       sevelamer  carbonate (RENVELA ) tablet 2,400 mg  2,400 mg Oral TID WC Mansy, Jan A, MD       sodium chloride  flush (NS) 0.9 % injection 3 mL  3 mL Intravenous PRN Mansy, Jan A, MD       traZODone  (DESYREL ) tablet 25 mg  25 mg Oral QHS PRN Mansy, Madison LABOR, MD         ALLERGIES Patient has no known allergies.  MEDICAL HISTORY Past Medical History:  Diagnosis Date   Anemia    Chronic kidney disease (CKD), stage IV (severe) (HCC)    COPD (chronic obstructive pulmonary disease) (HCC)    ESRD (end stage renal disease) (HCC)    Hemodialysis T, TH, SAT, [address] , [place]   GERD (gastroesophageal reflux disease)    History of blood transfusion    History of blood transfusion    Hypertension    Pneumonia    3 times in 2024-   PUD (peptic ulcer disease) 05/2022   Rheumatoid arthritis (HCC)    Rheumatoid arthritis (HCC)      SOCIAL HISTORY Social History   Socioeconomic History   Marital status: Significant Other    Spouse name: Not on file   Number of children: 0   Years of education: 12   Highest education level: 12th grade  Occupational History   Not on file  Tobacco Use   Smoking status: Former    Types: Cigarettes    Passive exposure: Past   Smokeless tobacco: Never  Vaping Use   Vaping status: Never Used  Substance and Sexual Activity   Alcohol use: Not Currently   Drug use: Never   Sexual activity: Not Currently  Other Topics Concern   Not on file  Social History Narrative   Not on file   Social Drivers of Health   Financial Resource  Strain: Low Risk  (03/07/2023)   Overall Financial Resource Strain (CARDIA)    Difficulty of Paying Living Expenses: Not hard at all  Food Insecurity: No Food Insecurity (12/13/2023)   Hunger Vital Sign    Worried About Running Out of Food in the Last Year: Never true    Ran Out of Food in the Last  Year: Never true  Transportation Needs: No Transportation Needs (12/13/2023)   PRAPARE - Administrator, Civil Service (Medical): No    Lack of Transportation (Non-Medical): No  Physical Activity: Inactive (03/07/2023)   Exercise Vital Sign    Days of Exercise per Week: 0 days    Minutes of Exercise per Session: 0 min  Stress: No Stress Concern Present (03/07/2023)   [employer] of Occupational Health - Occupational Stress Questionnaire    Feeling of Stress : Only a little  Social Connections: Unknown (12/13/2023)   Social Connection and Isolation Panel    Frequency of Communication with Friends and Family: Once a week    Frequency of Social Gatherings with Friends and Family: Once a week    Attends Religious Services: 1 to 4 times per year    Active Member of [employer] or Organizations: No    Attends Banker Meetings: Never    Marital Status: Patient declined  Catering manager Violence: Not At Risk (12/13/2023)   Humiliation, Afraid, Rape, and Kick questionnaire    Fear of Current or Ex-Partner: No    Emotionally Abused: No    Physically Abused: No    Sexually Abused: No     FAMILY HISTORY History reviewed. No pertinent family history.   Review of Systems: 12 systems were reviewed and negative except per HPI  Physical Exam: Vitals:   12/13/23 0720 12/13/23 0801  BP:  (!) 173/63  Pulse:  (!) 102  Resp:  18  Temp:  98.3 F (36.8 C)  SpO2: 96% 97%   No intake/output data recorded. No intake or output data in the 24 hours ending 12/13/23 0811  General: well-appearing, mild distress HEENT: anicteric sclera, MMM CV: normal rate, no rub, no lower  extremity edema Lungs: bilateral chest rise, mild increased work of breathing, crackles bilaterally, dec BS L base Abd: soft, non-tender, non-distended Skin: no visible lesions or rashes Psych: alert, engaged, appropriate mood and affect Neuro: normal speech, no gross focal deficits   Test Results Reviewed Lab Results  Component Value Date   NA 139 12/13/2023   K 4.6 12/13/2023   CL 107 12/13/2023   CO2 17 (L) 12/13/2023   BUN 77 (H) 12/13/2023   CREATININE 7.97 (H) 12/13/2023   CALCIUM  7.7 (L) 12/13/2023   ALBUMIN  3.7 12/08/2023   PHOS 5.5 (H) 12/08/2023    I have reviewed relevant outside healthcare records

## 2023-12-13 NOTE — Assessment & Plan Note (Addendum)
-   This is associated with end-stage renal disease on hemodialysis. - The patient will be admitted to the medical telemetry observation bed. - Will place her on diuresis for pulmonary congestion with IV Lasix . - Nephrology consult will be obtainedFor hemodialysis in AM. - I notified Dr. Dolan and Norine about the patient.

## 2023-12-13 NOTE — TOC Initial Note (Signed)
 Transition of Care [facility]) - Initial/Assessment Note    Patient Details  Name: Sheri Cooper MRN: [id number] Date of Birth: [birth year]  Transition of Care [facility]) CM/SW Contact:    Mcarthur Saddie Kim, LCSW Phone Number: 12/13/2023, 8:56 AM  Clinical Narrative: Pt admitted due to acute on chronic heart failure. TOC consulted for CHF screening. Pt reports she lives alone and is fairly independent with ADLs. She ambulates with a walker. Pt's dialysis is TTS at [facility]. She uses [facility] for transportation to appointments.  CHF screening completed. Pt indicates she wasn't aware she had been diagnosed with CHF. MD notified. Discussed importance of daily weights and following a heart healthy diet. Pt states she has a scale at home. CHF education added to AVS.                  Expected Discharge Plan: Home/Self Care Barriers to Discharge: Continued Medical Work up   Patient Goals and CMS Choice Patient states their goals for this hospitalization and ongoing recovery are:: return home   Choice offered to / list presented to : Patient [place] ownership interest in [facility].provided to::  (n/a)    Expected Discharge Plan and Services In-house Referral: Clinical Social Work     Living arrangements for the past 2 months: Apartment                                      Prior Living Arrangements/Services Living arrangements for the past 2 months: Apartment Lives with:: Self Patient language and need for interpreter reviewed:: Yes Do you feel safe going back to the place where you live?: Yes          Current home services: DME (cane, walker, wheelchair, nebulizer) Criminal Activity/Legal Involvement Pertinent to Current Situation/Hospitalization: No - Comment as needed  Activities of Daily Living   ADL Screening (condition at time of admission) Independently performs ADLs?: No Does the patient have a NEW difficulty with  bathing/dressing/toileting/self-feeding that is expected to last >3 days?: No Does the patient have a NEW difficulty with getting in/out of bed, walking, or climbing stairs that is expected to last >3 days?: No Does the patient have a NEW difficulty with communication that is expected to last >3 days?: No Is the patient deaf or have difficulty hearing?: No Does the patient have difficulty seeing, even when wearing glasses/contacts?: No Does the patient have difficulty concentrating, remembering, or making decisions?: No  Permission Sought/Granted                  Emotional Assessment     Affect (typically observed): Appropriate Orientation: : Oriented to Self, Oriented to Place, Oriented to  Time, Oriented to Situation Alcohol / Substance Use: Not Applicable Psych Involvement: No (comment)  Admission diagnosis:  SOB (shortness of breath) [R06.02] End stage renal disease on dialysis (HCC) [N18.6, Z99.2] Fluid overload [E87.70] Patient Active Problem List   Diagnosis Date Noted   Acute on chronic heart failure with preserved ejection fraction (HCC) 12/13/2023   Essential hypertension 12/03/2023   COPD (chronic obstructive pulmonary disease) (HCC) 12/03/2023   Peptic ulcer disease 12/03/2023   Hyperkalemia 09/27/2023   ESRD on hemodialysis (HCC) 09/26/2023   Acute on chronic heart failure with preserved ejection fraction (HFpEF) (HCC) 09/14/2023   Pneumonia 09/08/2023   Fluid overload 01/25/2023   COPD with acute exacerbation (HCC) 01/25/2023   Primary localized osteoarthrosis  of multiple sites 09/14/2022   Weight loss 09/14/2022   Acute respiratory failure with hypoxia (HCC) 09/07/2022   Lobar pneumonia (HCC) 09/07/2022   ESRD (end stage renal disease) (HCC) 09/07/2022   Sepsis due to undetermined organism (HCC) 05/30/2022   Pneumoperitoneum 05/28/2022   Duodenal perforation (HCC) 05/28/2022   Peritonitis (HCC) 05/28/2022   Acute upper GI bleed 05/24/2022   Multiple  duodenal ulcers 05/24/2022   ABLA (acute blood loss anemia) 05/24/2022   Melena 05/22/2022   HCAP (healthcare-associated pneumonia) 05/16/2022   Hypothermia 05/16/2022   Acute renal failure superimposed on stage 3b chronic kidney disease (HCC) 05/16/2022   Elevated brain natriuretic peptide (BNP) level 05/16/2022   Elevated troponin 05/16/2022   Increased anion gap metabolic acidosis 05/16/2022   Prolonged QT interval 05/16/2022   Hypoalbuminemia due to protein-calorie malnutrition (HCC) 05/16/2022   Iron  deficiency anemia 05/16/2022   CKD (chronic kidney disease), stage V (HCC) 05/16/2022   Renal mass 05/14/2022   Simple renal cyst 05/14/2022   Vitamin D deficiency 05/14/2022   Chronic obstructive pulmonary disease with (acute) exacerbation (HCC) 05/07/2022   Pneumonia due to infectious organism 05/07/2022   Acute nontraumatic kidney injury (HCC) 05/07/2022   Mixed hyperlipidemia 05/07/2022   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   Primary hypertension 05/07/2022   Rheumatoid arthritis involving both hands with negative rheumatoid factor (HCC) 05/07/2022   Medication therapy management recommendation declined by patient 07/28/2020   Encounter for monitoring aromatase inhibitor therapy 06/12/2020   History of radiation therapy 06/12/2020   Involvement of right lateral surgical margin by neoplasm 04/16/2020   Status post breast lumpectomy 04/16/2020   Coordination of complex care 01/23/2020   Osteopenia of multiple sites 01/23/2020   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 - [place], [place] - [address] [place] [address] Phone: [phone] Fax: [phone]     Social Drivers of Health (SDOH) Social History: SDOH Screenings   Food Insecurity: No Food Insecurity (12/13/2023)  Housing: Unknown (12/13/2023)  Transportation Needs: No Transportation Needs (12/13/2023)  Utilities: Not At Risk (12/13/2023)  Alcohol Screen: Low Risk  (03/07/2023)  Depression (PHQ2-9): Low Risk  (12/09/2023)  Financial Resource Strain: Low Risk  (03/07/2023)  Physical Activity: Inactive (03/07/2023)  Social Connections: Unknown (12/13/2023)  Stress: No Stress Concern Present (03/07/2023)  Tobacco Use: Medium Risk (12/13/2023)  Health Literacy: Adequate Health Literacy (03/07/2023)   SDOH Interventions:     Readmission Risk Interventions    12/08/2023    9:55 AM 12/06/2023   11:32 AM 09/28/2023    8:15 AM  Readmission Risk Prevention Plan  Transportation Screening Complete Complete Complete  HRI or Home Care Consult   Complete  Social Work Consult for Recovery Care Planning/Counseling   Complete  Palliative Care Screening   Not Applicable  Medication Review Oceanographer) Complete Complete Complete  HRI or Home Care Consult Complete Complete   SW Recovery Care/Counseling Consult Complete    Palliative Care Screening Not Applicable Not Applicable   Skilled Nursing Facility Not Applicable Not Applicable

## 2023-12-13 NOTE — Telephone Encounter (Signed)
 Copied from CRM [id number]. Topic: Referral - Question >> Dec 13, 2023  9:42 AM Rozanna MATSU wrote: Reason for CRM: PT STATED IN HOSPITAL STAED WILL CALL WHEN DISCHARED TO SCHEDULE REFERRAL

## 2023-12-13 NOTE — Assessment & Plan Note (Addendum)
-   She was recently given doxycycline  and prednisone  taper for recent exacerbation.  Both will be resumed for now. - The patient will be placed on scheduled and as needed DuoNebs.

## 2023-12-13 NOTE — Progress Notes (Signed)
 PROGRESS NOTE  Sheri Cooper, is a 73 y.o. female, DOB - 23-Aug-1950, FMW:[id number]  Admit date - 12/12/2023   Admitting Physician Zaccheus Edmister Pearlean, MD  Outpatient Primary MD for the patient is Toribio Jerel MATSU, MD  LOS - 0  Chief Complaint  Patient presents with   Shortness of Breath      Brief Narrative:  73 y.o. 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-Tuesday Thursday Saturday,  -Follow volume status with hypoxia and pleural effusions -Tolerated hemodialysis well on 12/13/2023 with removal of 2.8 kg (patient weighs only 40 kg) - Allergic consult appreciated  2)Acute on chronic heart failure with preserved ejection fraction -- -BNP greater than 2300  -Echo from 09/27/2023 with EF of 60 to 65% -- Chest x-ray with bilateral pleural effusions - Hemodialysis as above #1 - IR consult for left-sided thoracentesis  3) acute hypoxic respiratory failure--due to #1 #2 above - Much improved after hemodialysis on 12/13/2023  4) chronic anemia of ESRD--Hgb stable around 9  --defer to nephrology service timing of ESA/Procrit agents  5)HTN-continue losartan  and hydralazine  as well as amlodipine  COPD with acute exacerbation (HCC) - She was recently given doxycycline  and prednisone  taper for recent exacerbation.   - Continue mucolytics and bronchodilators  Status is: Inpatient   Disposition: The patient is from: Home              Anticipated d/c is to: Home              Anticipated d/c date is: 2 days              Patient currently is not medically stable to d/c. Barriers: Not Clinically Stable-   Code Status :  -  Code Status: Full Code   Family Communication:    NA (patient is alert, awake and  coherent)   DVT Prophylaxis  :   - SCDs  heparin  injection 5,000 Units Start: 12/13/23 0600   Lab Results  Component Value Date   PLT 316 12/13/2023    Inpatient Medications  Scheduled Meds:  amLODipine   10 mg Oral Daily   Chlorhexidine  Gluconate Cloth  6 each Topical Q0600   doxycycline   100 mg Oral Q12H   ferrous sulfate   325 mg Oral Q T,Th,Sat-1800   furosemide   60 mg Intravenous Q12H   heparin   5,000 Units Subcutaneous Q8H   hydrALAZINE   50 mg Oral BID   ipratropium-albuterol   3 mL Nebulization QID   losartan   50 mg Oral Daily   metoprolol  succinate  50 mg Oral Daily   predniSONE   20 mg Oral Q breakfast   sevelamer  carbonate  2,400 mg Oral TID WC   Continuous Infusions: PRN Meds:.acetaminophen  **OR** acetaminophen , albuterol , magnesium  hydroxide, sodium chloride  flush, traZODone    Anti-infectives (From admission, onward)    Start     Dose/Rate Route Frequency Ordered Stop   12/12/23 2345  doxycycline  (VIBRA -TABS) tablet 100 mg        100 mg Oral Every 12 hours 12/12/23 2330           Subjective: Sheri Cooper today has no fevers, no emesis,  No chest pain,   Tolerated hemodialysis well with removal of 2.8 kg -Dyspnea and hypoxia resolving after hemodialysis   Objective: Vitals:   12/13/23 1430  12/13/23 1445 12/13/23 1450 12/13/23 1518  BP:  99/77 (!) 164/61   Pulse:   98   Resp: 16 16 18    Temp:   98 F (36.7 C)   TempSrc:   Oral   SpO2:    96%  Weight:   40.1 kg   Height:        Intake/Output Summary (Last 24 hours) at 12/13/2023 1858 Last data filed at 12/13/2023 1756 Gross per 24 hour  Intake 360 ml  Output 2800 ml  Net -2440 ml   Filed Weights   12/13/23 0723 12/13/23 1100 12/13/23 1450  Weight: 43 kg 42.9 kg 40.1 kg    Physical Exam  Gen:- Awake Alert,  in no apparent distress , no conversational dyspnea HEENT:- [place].AT, No sclera icterus Nose- [place] 2L/min--- weaning off Neck-Supple Neck,No JVD,.  Lungs-diminished in bases left more  than right, no wheezing CV- S1, S2 normal, regular  Abd-  +ve B.Sounds, Abd Soft, No tenderness,    Extremity/Skin:- No  edema, pedal pulses present  Psych-affect is appropriate, oriented x3 Neuro-no new focal deficits, no tremors  Data Reviewed: I have personally reviewed following labs and imaging studies  CBC: Recent Labs  Lab 12/07/23 0541 12/08/23 0448 12/12/23 0025 12/13/23 0435  WBC 13.4* 12.1* 18.1* 13.1*  NEUTROABS 12.0* 9.5*  --   --   HGB 8.3* 8.6* 9.9* 8.9*  HCT 25.8* 26.4* 29.6* 26.9*  MCV 84.0 86.0 85.1 85.9  PLT 422* 429* 316 316   Basic Metabolic Panel: Recent Labs  Lab 12/07/23 0541 12/08/23 0448 12/12/23 0025 12/12/23 2201 12/13/23 0435  NA 137 134* 139 140 139  K 4.1 3.9 4.3 4.9 4.6  CL 97* 94* 104 104 107  CO2 25 25 18* 17* 17*  GLUCOSE 124* 100* 112* 127* 118*  BUN 26* 55* 52* 72* 77*  CREATININE 3.45* 5.45* 6.53* 7.81* 7.97*  CALCIUM  8.6* 8.0* 7.9* 7.9* 7.7*  PHOS 5.2* 5.5*  --   --   --    GFR: Estimated Creatinine Clearance: 3.9 mL/min (A) (by C-G formula based on SCr of 7.97 mg/dL (H)). Liver Function Tests: Recent Labs  Lab 12/07/23 0541 12/08/23 0448  ALBUMIN  3.8 3.7   Recent Results (from the past 240 hours)  Resp panel by RT-PCR (RSV, Flu A&B, Covid) Anterior Nasal Swab     Status: None   Collection Time: 12/05/23  8:18 PM   Specimen: Anterior Nasal Swab  Result Value Ref Range Status   SARS Coronavirus 2 by RT PCR NEGATIVE NEGATIVE Final    Comment: (NOTE) SARS-CoV-2 target nucleic acids are NOT DETECTED.  The SARS-CoV-2 RNA is generally detectable in upper respiratory specimens during the acute phase of infection. The lowest concentration of SARS-CoV-2 viral copies this assay can detect is 138 copies/mL. A negative result does not preclude SARS-Cov-2 infection and should not be used as the sole basis for treatment or other patient management decisions. A negative result may occur with  improper specimen  collection/handling, submission of specimen other than nasopharyngeal swab, presence of viral mutation(s) within the areas targeted by this assay, and inadequate number of viral copies(<138 copies/mL). A negative result must be combined with clinical observations, patient history, and epidemiological information. The expected result is Negative.  Fact Sheet for Patients:  BloggerCourse.com  Fact Sheet for Healthcare Providers:  SeriousBroker.it  This test is no t yet approved or cleared by the [place]  FDA and  has been authorized for detection and/or diagnosis of SARS-CoV-2 by  FDA under an Emergency Use Authorization (EUA). This EUA will remain  in effect (meaning this test can be used) for the duration of the COVID-19 declaration under Section 564(b)(1) of the Act, 21 U.S.C.section 360bbb-3(b)(1), unless the authorization is terminated  or revoked sooner.       Influenza A by PCR NEGATIVE NEGATIVE Final   Influenza B by PCR NEGATIVE NEGATIVE Final    Comment: (NOTE) The Xpert Xpress SARS-CoV-2/FLU/RSV plus assay is intended as an aid in the diagnosis of influenza from Nasopharyngeal swab specimens and should not be used as a sole basis for treatment. Nasal washings and aspirates are unacceptable for Xpert Xpress SARS-CoV-2/FLU/RSV testing.  Fact Sheet for Patients: BloggerCourse.com  Fact Sheet for Healthcare Providers: SeriousBroker.it  This test is not yet approved or cleared by the [place]  FDA and has been authorized for detection and/or diagnosis of SARS-CoV-2 by FDA under an Emergency Use Authorization (EUA). This EUA will remain in effect (meaning this test can be used) for the duration of the COVID-19 declaration under Section 564(b)(1) of the Act, 21 U.S.C. section 360bbb-3(b)(1), unless the authorization is terminated or revoked.     Resp Syncytial  Virus by PCR NEGATIVE NEGATIVE Final    Comment: (NOTE) Fact Sheet for Patients: BloggerCourse.com  Fact Sheet for Healthcare Providers: SeriousBroker.it  This test is not yet approved or cleared by the [place]  FDA and has been authorized for detection and/or diagnosis of SARS-CoV-2 by FDA under an Emergency Use Authorization (EUA). This EUA will remain in effect (meaning this test can be used) for the duration of the COVID-19 declaration under Section 564(b)(1) of the Act, 21 U.S.C. section 360bbb-3(b)(1), unless the authorization is terminated or revoked.  Performed at [facility], [address] [place], [place] [address]     Radiology Studies: DG Chest Portable 1 View Result Date: 12/12/2023 CLINICAL DATA:  Shortness of breath.  Bilateral feet numbness. EXAM: PORTABLE CHEST 1 VIEW COMPARISON:  Radiograph earlier today FINDINGS: Stable cardiomegaly. Unchanged mediastinal contours. Unchanged bilateral pleural effusions, left greater than right with associated bibasilar opacities. Interstitial thickening suspicious for pulmonary edema, also unchanged. No pneumothorax. Left breast/axillary surgical clips. IMPRESSION: 1. Unchanged bilateral pleural effusions, left greater than right with associated bibasilar opacities. 2. Stable cardiomegaly. Unchanged interstitial thickening suspicious for pulmonary edema. Electronically Signed   By: Andrea Gasman M.D.   On: 12/12/2023 22:57   DG Chest Portable 1 View Result Date: 12/12/2023 CLINICAL DATA:  Shortness of breath and decreased breath sounds. EXAM: PORTABLE CHEST 1 VIEW COMPARISON:  Portable chest 12/05/2023 FINDINGS: Moderate pleural effusions on the left greater than right continue to be seen, not significantly changed. Stable mild cardiomegaly, mild lower zonal interstitial edema and central vascular prominence. There are bibasilar opacities overlying the pleural effusions which could  be atelectasis or consolidation and are also unchanged. The remaining lungs appear clear. The mediastinum is normally outlined. Aortic atherosclerosis. No new osseous finding.  Left axillary surgical clips. IMPRESSION: 1. No significant change in the appearance of the chest from 1 week ago. 2. Moderate pleural effusions on the left greater than right, with overlying atelectasis or consolidation. 3. Stable cardiomegaly and mild interstitial edema. Electronically Signed   By: Francis Quam M.D.   On: 12/12/2023 00:59   Scheduled Meds:  amLODipine   10 mg Oral Daily   Chlorhexidine  Gluconate Cloth  6 each Topical Q0600   doxycycline   100 mg Oral Q12H   ferrous sulfate   325 mg Oral Q T,Th,Sat-1800  furosemide   60 mg Intravenous Q12H   heparin   5,000 Units Subcutaneous Q8H   hydrALAZINE   50 mg Oral BID   ipratropium-albuterol   3 mL Nebulization QID   losartan   50 mg Oral Daily   metoprolol  succinate  50 mg Oral Daily   predniSONE   20 mg Oral Q breakfast   sevelamer  carbonate  2,400 mg Oral TID WC   Continuous Infusions:   LOS: 0 days   Rendall Carwin M.D on 12/13/2023 at 6:58 PM  Go to [email or web address] - for contact info  [facility] - Office  [phone]  If 7PM-7AM, please contact night-coverage [email or web address] 12/13/2023, 6:58 PM

## 2023-12-13 NOTE — ED Notes (Signed)
 Pt reports she feels better and legs are no longer numb.

## 2023-12-13 NOTE — Assessment & Plan Note (Signed)
 Management as above

## 2023-12-13 NOTE — Plan of Care (Signed)

## 2023-12-13 NOTE — Progress Notes (Signed)
 Pt tolerated dialysis treatment well. UF off 2.8 L.  Dr. Norine notified.  12/13/23 1450  Vitals  Temp 98 F (36.7 C)  Temp Source Oral  BP (!) 164/61  BP Location Right Arm  BP Method Automatic  Patient Position (if appropriate) Lying  Pulse Rate 98  Resp 18  Weight 40.1 kg  Type of Weight Post-Dialysis  Oxygen Therapy  O2 Device Room Air  During Treatment Monitoring  Intra-Hemodialysis Comments Tx completed  Post Treatment  Dialyzer Clearance Lightly streaked  Hemodialysis Intake (mL) 0 mL  Liters Processed 62  Fluid Removed (mL) 2800 mL  Tolerated HD Treatment Yes  Post-Hemodialysis Comments see notes.  AVG/AVF Arterial Site Held (minutes) 10 minutes  AVG/AVF Venous Site Held (minutes) 10 minutes  Fistula / Graft Left Upper arm Arteriovenous vein graft  Placement Date/Time: 11/29/22 1428   Orientation: Left  Access Location: Upper arm  Access Type: Arteriovenous vein graft  Site Condition No complications  Fistula / Graft Assessment Present;Thrill;Bruit  Status Deaccessed  Needle Size 15  Drainage Description None

## 2023-12-13 NOTE — Assessment & Plan Note (Signed)
-   Will continue antihypertensive therapy.

## 2023-12-13 NOTE — Progress Notes (Addendum)
 Pt. Receives OP HD, [facility], TTS, chair time 11:00am. Contacted clinic to inform she will not be at appt today. Clinic states that pt was upset they did not take more fluid off of her during last appointment. Will continue to assist as needed.    [facility] Iness Pangilinan Dialysis Navigator [phone]   [facility]#- [phone]

## 2023-12-13 NOTE — ED Notes (Signed)
Report given to nurse on the floor. 

## 2023-12-14 ENCOUNTER — Inpatient Hospital Stay (HOSPITAL_COMMUNITY)

## 2023-12-14 DIAGNOSIS — J441 Chronic obstructive pulmonary disease with (acute) exacerbation: Secondary | ICD-10-CM | POA: Diagnosis not present

## 2023-12-14 DIAGNOSIS — I5033 Acute on chronic diastolic (congestive) heart failure: Secondary | ICD-10-CM | POA: Diagnosis not present

## 2023-12-14 DIAGNOSIS — E8779 Other fluid overload: Secondary | ICD-10-CM | POA: Diagnosis not present

## 2023-12-14 LAB — BODY FLUID CELL COUNT WITH DIFFERENTIAL
Lymphs, Fluid: 64 %
Monocyte-Macrophage-Serous Fluid: 29 % — ABNORMAL LOW (ref 50–90)
Neutrophil Count, Fluid: 7 % (ref 0–25)
Total Nucleated Cell Count, Fluid: 460 uL (ref 0–1000)

## 2023-12-14 LAB — PROTEIN, PLEURAL OR PERITONEAL FLUID: Total protein, fluid: <3 g/dL

## 2023-12-14 LAB — RENAL FUNCTION PANEL
Albumin: 3.8 g/dL (ref 3.5–5.0)
Anion gap: 14 (ref 5–15)
BUN: 38 mg/dL — ABNORMAL HIGH (ref 8–23)
CO2: 25 mmol/L (ref 22–32)
Calcium: 8.3 mg/dL — ABNORMAL LOW (ref 8.9–10.3)
Chloride: 98 mmol/L (ref 98–111)
Creatinine, Ser: 4.56 mg/dL — ABNORMAL HIGH (ref 0.44–1.00)
GFR, Estimated: 10 mL/min — ABNORMAL LOW (ref >60)
Glucose, Bld: 87 mg/dL (ref 70–99)
Phosphorus: 4.6 mg/dL (ref 2.5–4.6)
Potassium: 4.4 mmol/L (ref 3.5–5.1)
Sodium: 137 mmol/L (ref 135–145)

## 2023-12-14 LAB — CBC
HCT: 30.9 % — ABNORMAL LOW (ref 36.0–46.0)
Hemoglobin: 10.1 g/dL — ABNORMAL LOW (ref 12.0–15.0)
MCH: 27.6 pg (ref 26.0–34.0)
MCHC: 32.7 g/dL (ref 30.0–36.0)
MCV: 84.4 fL (ref 80.0–100.0)
Platelets: 338 K/uL (ref 150–400)
RBC: 3.66 MIL/uL — ABNORMAL LOW (ref 3.87–5.11)
RDW: 20.3 % — ABNORMAL HIGH (ref 11.5–15.5)
WBC: 13.2 K/uL — ABNORMAL HIGH (ref 4.0–10.5)
nRBC: 0 % (ref 0.0–0.2)

## 2023-12-14 LAB — LACTATE DEHYDROGENASE: LDH: 183 U/L (ref 98–192)

## 2023-12-14 LAB — LACTATE DEHYDROGENASE, PLEURAL OR PERITONEAL FLUID: LD, Fluid: 102 U/L — ABNORMAL HIGH (ref 3–23)

## 2023-12-14 LAB — GRAM STAIN

## 2023-12-14 MED ORDER — IPRATROPIUM-ALBUTEROL 0.5-2.5 (3) MG/3ML IN SOLN
3.0000 mL | Freq: Two times a day (BID) | RESPIRATORY_TRACT | Status: DC
  Administered 2023-12-14 – 2023-12-15 (×5): 3 mL via RESPIRATORY_TRACT
  Filled 2023-12-14 (×2): qty 3

## 2023-12-14 MED ORDER — LIDOCAINE HCL (PF) 2 % IJ SOLN
INTRAMUSCULAR | Status: AC
  Filled 2023-12-14: qty 10

## 2023-12-14 MED ORDER — LOSARTAN POTASSIUM 50 MG PO TABS
100.0000 mg | ORAL_TABLET | Freq: Every day | ORAL | Status: DC
  Filled 2023-12-14: qty 2

## 2023-12-14 MED ORDER — LOSARTAN POTASSIUM 50 MG PO TABS
50.0000 mg | ORAL_TABLET | Freq: Once | ORAL | Status: AC
  Administered 2023-12-14 (×2): 50 mg via ORAL
  Filled 2023-12-14: qty 1

## 2023-12-14 MED ORDER — LIDOCAINE HCL (PF) 2 % IJ SOLN
10.0000 mL | Freq: Once | INTRAMUSCULAR | Status: AC
  Administered 2023-12-14 (×2): 10 mL via INTRADERMAL

## 2023-12-14 NOTE — Procedures (Signed)
 PROCEDURE SUMMARY:  Successful US  guided left thoracentesis. Yielded 600 ml of clear yellow fluid. Pt tolerated procedure well. No immediate complications.  Specimen sent for labs. CXR ordered; results pending  EBL < 2 mL  Warren JONELLE Dais, NP 12/14/2023 2:58 PM

## 2023-12-14 NOTE — Progress Notes (Signed)
 PROGRESS NOTE  Sheri Cooper, is a 73 y.o. female, DOB - 07/30/1950, FMW:[id number]  Admit date - 12/12/2023   Admitting Physician Shaden Higley Pearlean, MD  Outpatient Primary MD for the patient is Toribio Jerel MATSU, MD  LOS - 1  Chief Complaint  Patient presents with   Shortness of Breath      Brief Narrative:  73 y.o. 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-Tuesday Thursday Saturday,  -Follow volume status with hypoxia and pleural effusions -Tolerated hemodialysis well on 12/13/2023 with removal of 2.8 kg (patient weighs only 40 kg) - Nephrology consult appreciated  2)Acute on chronic heart failure with preserved ejection fraction -- -BNP greater than 2300  -Echo from 09/27/2023 with EF of 60 to 65% -- On admission chest x-ray with bilateral pleural effusions, postthoracentesis repeat chest x-ray on 12/14/2023 with improvement in left-sided pleural effusion - Hemodialysis as above #1 - IR consult for left-sided thoracentesis--please see #6 below - Dyspnea improving post left-sided thoracentesis-  3) acute hypoxic respiratory failure--due to #1 #2 above - Much improved after hemodialysis on 12/13/2023  4) chronic anemia of ESRD--Hgb stable around 9  --defer to nephrology service timing of ESA/Procrit agents  5)HTN-continue losartan  and hydralazine  as well as amlodipine  COPD with acute exacerbation (HCC) - She was recently given doxycycline  and prednisone  taper for recent exacerbation.   - Continue mucolytics and bronchodilators  6)Bilateral pleural effusion left more than right--see #2 above - Status post IR left-sided thoracentesis on 12/14/2023--cell count low - Fluid analysis by lights criteria  consistent with transudate - With pleural fluid LDH of 102 and  serum LDH of 183 = 0.56 - Continue to use hemodialysis to address pulm status  Status is: Inpatient   Disposition: The patient is from: Home              Anticipated d/c is to: Home              Anticipated d/c date is: 1 day              Patient currently is not medically stable to d/c. Barriers: Not Clinically Stable-   Code Status :  -  Code Status: Full Code   Family Communication:    NA (patient is alert, awake and coherent)   DVT Prophylaxis  :   - SCDs  heparin  injection 5,000 Units Start: 12/13/23 0600   Lab Results  Component Value Date   PLT 338 12/14/2023    Inpatient Medications  Scheduled Meds:  amLODipine   10 mg Oral Daily   Chlorhexidine  Gluconate Cloth  6 each Topical Q0600   doxycycline   100 mg Oral Q12H   ferrous sulfate   325 mg Oral Q T,Th,Sat-1800   heparin   5,000 Units Subcutaneous Q8H   hydrALAZINE   50 mg Oral BID   ipratropium-albuterol   3 mL Nebulization BID   [START ON 12/15/2023] losartan   100 mg Oral Daily   metoprolol  succinate  50 mg Oral Daily   predniSONE   20 mg Oral Q breakfast   sevelamer  carbonate  2,400 mg Oral TID WC   Continuous Infusions: PRN Meds:.acetaminophen  **OR** acetaminophen , albuterol , magnesium  hydroxide, sodium chloride  flush, traZODone    Anti-infectives (From admission, onward)    Start     Dose/Rate Route Frequency Ordered Stop  12/12/23 2345  doxycycline  (VIBRA -TABS) tablet 100 mg        100 mg Oral Every 12 hours 12/12/23 2330           Subjective: Sheri Cooper today has no fevers, no emesis,  No chest pain,   -Tolerated left-sided thoracentesis well on 12/14/2023 - Dyspnea improving -- Anticipate discharge home on 12/15/2023 after hemodialysis   Objective: Vitals:   12/14/23 0314 12/14/23 0732 12/14/23 1244 12/14/23 1430  BP: (!) 188/55  (!) 119/40 109/60  Pulse: 79  62   Resp: 16     Temp: 98.4 F (36.9 C)  98.1 F (36.7 C)    TempSrc: Oral  Oral   SpO2: 96% 96% 98%   Weight: 40.4 kg     Height:        Intake/Output Summary (Last 24 hours) at 12/14/2023 1907 Last data filed at 12/14/2023 1200 Gross per 24 hour  Intake 360 ml  Output --  Net 360 ml   Filed Weights   12/13/23 1100 12/13/23 1450 12/14/23 0314  Weight: 42.9 kg 40.1 kg 40.4 kg    Physical Exam  Gen:- Awake Alert,  in no apparent distress , no conversational dyspnea HEENT:- Ashley.AT, No sclera icterus Neck-Supple Neck,No JVD,.  Lungs-improving air movement on the left after Thora, no wheezing CV- S1, S2 normal, regular  Abd-  +ve B.Sounds, Abd Soft, No tenderness,    Extremity/Skin:- No  edema, pedal pulses present  Psych-affect is appropriate, oriented x3 Neuro-no new focal deficits, no tremors  Data Reviewed: I have personally reviewed following labs and imaging studies  CBC: Recent Labs  Lab 12/08/23 0448 12/12/23 0025 12/13/23 0435 12/14/23 0513  WBC 12.1* 18.1* 13.1* 13.2*  NEUTROABS 9.5*  --   --   --   HGB 8.6* 9.9* 8.9* 10.1*  HCT 26.4* 29.6* 26.9* 30.9*  MCV 86.0 85.1 85.9 84.4  PLT 429* 316 316 338   Basic Metabolic Panel: Recent Labs  Lab 12/08/23 0448 12/12/23 0025 12/12/23 2201 12/13/23 0435 12/14/23 0513  NA 134* 139 140 139 137  K 3.9 4.3 4.9 4.6 4.4  CL 94* 104 104 107 98  CO2 25 18* 17* 17* 25  GLUCOSE 100* 112* 127* 118* 87  BUN 55* 52* 72* 77* 38*  CREATININE 5.45* 6.53* 7.81* 7.97* 4.56*  CALCIUM  8.0* 7.9* 7.9* 7.7* 8.3*  PHOS 5.5*  --   --   --  4.6   GFR: Estimated Creatinine Clearance: 6.9 mL/min (A) (by C-G formula based on SCr of 4.56 mg/dL (H)). Liver Function Tests: Recent Labs  Lab 12/08/23 0448 12/14/23 0513  ALBUMIN  3.7 3.8   Recent Results (from the past 240 hours)  Resp panel by RT-PCR (RSV, Flu A&B, Covid) Anterior Nasal Swab     Status: None   Collection Time: 12/05/23  8:18 PM   Specimen: Anterior Nasal Swab  Result Value Ref Range Status   SARS Coronavirus 2 by RT PCR  NEGATIVE NEGATIVE Final    Comment: (NOTE) SARS-CoV-2 target nucleic acids are NOT DETECTED.  The SARS-CoV-2 RNA is generally detectable in upper respiratory specimens during the acute phase of infection. The lowest concentration of SARS-CoV-2 viral copies this assay can detect is 138 copies/mL. A negative result does not preclude SARS-Cov-2 infection and should not be used as the sole basis for treatment or other patient management decisions. A negative result may occur with  improper specimen collection/handling, submission of specimen other than nasopharyngeal swab, presence of viral mutation(s) within  the areas targeted by this assay, and inadequate number of viral copies(<138 copies/mL). A negative result must be combined with clinical observations, patient history, and epidemiological information. The expected result is Negative.  Fact Sheet for Patients:  BloggerCourse.com  Fact Sheet for Healthcare Providers:  SeriousBroker.it  This test is no t yet approved or cleared by the [place]  FDA and  has been authorized for detection and/or diagnosis of SARS-CoV-2 by FDA under an Emergency Use Authorization (EUA). This EUA will remain  in effect (meaning this test can be used) for the duration of the COVID-19 declaration under Section 564(b)(1) of the Act, 21 U.S.C.section 360bbb-3(b)(1), unless the authorization is terminated  or revoked sooner.       Influenza A by PCR NEGATIVE NEGATIVE Final   Influenza B by PCR NEGATIVE NEGATIVE Final    Comment: (NOTE) The Xpert Xpress SARS-CoV-2/FLU/RSV plus assay is intended as an aid in the diagnosis of influenza from Nasopharyngeal swab specimens and should not be used as a sole basis for treatment. Nasal washings and aspirates are unacceptable for Xpert Xpress SARS-CoV-2/FLU/RSV testing.  Fact Sheet for Patients: BloggerCourse.com  Fact Sheet for  Healthcare Providers: SeriousBroker.it  This test is not yet approved or cleared by the [place]  FDA and has been authorized for detection and/or diagnosis of SARS-CoV-2 by FDA under an Emergency Use Authorization (EUA). This EUA will remain in effect (meaning this test can be used) for the duration of the COVID-19 declaration under Section 564(b)(1) of the Act, 21 U.S.C. section 360bbb-3(b)(1), unless the authorization is terminated or revoked.     Resp Syncytial Virus by PCR NEGATIVE NEGATIVE Final    Comment: (NOTE) Fact Sheet for Patients: BloggerCourse.com  Fact Sheet for Healthcare Providers: SeriousBroker.it  This test is not yet approved or cleared by the [place]  FDA and has been authorized for detection and/or diagnosis of SARS-CoV-2 by FDA under an Emergency Use Authorization (EUA). This EUA will remain in effect (meaning this test can be used) for the duration of the COVID-19 declaration under Section 564(b)(1) of the Act, 21 U.S.C. section 360bbb-3(b)(1), unless the authorization is terminated or revoked.  Performed at [facility], [address] [place], [place] [address]   Gram stain     Status: None   Collection Time: 12/14/23  2:20 PM   Specimen: Pleura  Result Value Ref Range Status   Specimen Description PLEURAL  Final   Special Requests PLEURAL  Final   Gram Stain   Final    PLEURAL WBC PRESENT,BOTH PMN AND MONONUCLEAR NO ORGANISMS SEEN CYTOSPIN SMEAR Performed at [facility], [address] [place], [place] [address]    Report Status 12/14/2023 FINAL  Final    Radiology Studies: DG Chest 1 View Result Date: 12/14/2023 CLINICAL DATA:  [id number] Status post thoracentesis [id number] EXAM: CHEST  1 VIEW COMPARISON:  December 12, 2023 FINDINGS: Unchanged small right pleural effusion. Interval decrease in size of the left pleural effusion, now small in volume. Patchy opacities in  both lung bases, likely atelectasis. No pneumothorax. Moderate cardiomegaly. Tortuous aorta with aortic atherosclerosis. No acute fracture or destructive lesions. Multilevel thoracic osteophytosis. Surgical clips in the left breast. IMPRESSION: 1. Interval decrease in size of the left pleural effusion, now small in volume. No pneumothorax. 2. Unchanged small right pleural effusion. Electronically Signed   By: Rogelia Myers M.D.   On: 12/14/2023 16:18   DG Chest Portable 1 View Result Date: 12/12/2023 CLINICAL DATA:  Shortness of breath.  Bilateral feet  numbness. EXAM: PORTABLE CHEST 1 VIEW COMPARISON:  Radiograph earlier today FINDINGS: Stable cardiomegaly. Unchanged mediastinal contours. Unchanged bilateral pleural effusions, left greater than right with associated bibasilar opacities. Interstitial thickening suspicious for pulmonary edema, also unchanged. No pneumothorax. Left breast/axillary surgical clips. IMPRESSION: 1. Unchanged bilateral pleural effusions, left greater than right with associated bibasilar opacities. 2. Stable cardiomegaly. Unchanged interstitial thickening suspicious for pulmonary edema. Electronically Signed   By: Andrea Gasman M.D.   On: 12/12/2023 22:57   Scheduled Meds:  amLODipine   10 mg Oral Daily   Chlorhexidine  Gluconate Cloth  6 each Topical Q0600   doxycycline   100 mg Oral Q12H   ferrous sulfate   325 mg Oral Q T,Th,Sat-1800   heparin   5,000 Units Subcutaneous Q8H   hydrALAZINE   50 mg Oral BID   ipratropium-albuterol   3 mL Nebulization BID   [START ON 12/15/2023] losartan   100 mg Oral Daily   metoprolol  succinate  50 mg Oral Daily   predniSONE   20 mg Oral Q breakfast   sevelamer  carbonate  2,400 mg Oral TID WC   Continuous Infusions:   LOS: 1 day   Rendall Carwin M.D on 12/14/2023 at 7:07 PM  Go to [email or web address] - for contact info  [facility] - Office  [phone]  If 7PM-7AM, please contact night-coverage [email or web address] 12/14/2023, 7:07 PM

## 2023-12-14 NOTE — Progress Notes (Signed)
 [name] Progress Note   Subjective:   Had UF 2.8L with HD yesterday, no issues during treatment.  Feels better this AM.  Plans for thoracentesis with IR.  No new issues.   Objective Vitals:   12/13/23 2046 12/13/23 2107 12/14/23 0314 12/14/23 0732  BP: (!) 192/54  (!) 188/55   Pulse: 82  79   Resp: 18  16   Temp: 98 F (36.7 C)  98.4 F (36.9 C)   TempSrc: Oral  Oral   SpO2: 92% 92% 96% 96%  Weight:   40.4 kg   Height:       Physical Exam General: eating breakfast vigorously Heart: RRR Lungs: normal WOB on RA this AM Extremities: no edema Dialysis Access: LUE AVG +t/b  Additional Objective Labs: Basic Metabolic Panel: Recent Labs  Lab 12/08/23 0448 12/12/23 0025 12/12/23 2201 12/13/23 0435 12/14/23 0513  NA 134*   < > 140 139 137  K 3.9   < > 4.9 4.6 4.4  CL 94*   < > 104 107 98  CO2 25   < > 17* 17* 25  GLUCOSE 100*   < > 127* 118* 87  BUN 55*   < > 72* 77* 38*  CREATININE 5.45*   < > 7.81* 7.97* 4.56*  CALCIUM  8.0*   < > 7.9* 7.7* 8.3*  PHOS 5.5*  --   --   --  4.6   < > = values in this interval not displayed.   Liver Function Tests: Recent Labs  Lab 12/08/23 0448 12/14/23 0513  ALBUMIN  3.7 3.8   No results for input(s): LIPASE, AMYLASE in the last 168 hours. CBC: Recent Labs  Lab 12/08/23 0448 12/12/23 0025 12/13/23 0435 12/14/23 0513  WBC 12.1* 18.1* 13.1* 13.2*  NEUTROABS 9.5*  --   --   --   HGB 8.6* 9.9* 8.9* 10.1*  HCT 26.4* 29.6* 26.9* 30.9*  MCV 86.0 85.1 85.9 84.4  PLT 429* 316 316 338   Blood Culture    Component Value Date/Time   SDES  05/28/2022 1005    URINE, CATHETERIZED Performed at [facility], [address] [place], [place] [address]    [facility]  05/28/2022 1005    NONE Performed at [facility], [address], [place], [place] [address]    CULT (A) 05/28/2022 1005    >=100,000 COLONIES/mL ENTEROCOCCUS FAECIUM VANCOMYCIN  RESISTANT ENTEROCOCCUS ISOLATED    REPTSTATUS 05/30/2022 FINAL  05/28/2022 1005    Cardiac Enzymes: No results for input(s): CKTOTAL, CKMB, CKMBINDEX, TROPONINI in the last 168 hours. CBG: No results for input(s): GLUCAP in the last 168 hours. Iron  Studies: No results for input(s): IRON , TIBC, TRANSFERRIN, FERRITIN in the last 72 hours. @lablastinr3 @ Studies/Results: DG Chest Portable 1 View Result Date: 12/12/2023 CLINICAL DATA:  Shortness of breath.  Bilateral feet numbness. EXAM: PORTABLE CHEST 1 VIEW COMPARISON:  Radiograph earlier today FINDINGS: Stable cardiomegaly. Unchanged mediastinal contours. Unchanged bilateral pleural effusions, left greater than right with associated bibasilar opacities. Interstitial thickening suspicious for pulmonary edema, also unchanged. No pneumothorax. Left breast/axillary surgical clips. IMPRESSION: 1. Unchanged bilateral pleural effusions, left greater than right with associated bibasilar opacities. 2. Stable cardiomegaly. Unchanged interstitial thickening suspicious for pulmonary edema. Electronically Signed   By: Andrea Gasman M.D.   On: 12/12/2023 22:57   Medications:   amLODipine   10 mg Oral Daily   Chlorhexidine  Gluconate Cloth  6 each Topical Q0600   doxycycline   100 mg Oral Q12H   ferrous sulfate   325 mg Oral Q T,Th,Sat-1800   heparin   5,000 Units Subcutaneous Q8H   hydrALAZINE   50 mg Oral BID   ipratropium-albuterol   3 mL Nebulization BID   [START ON 12/15/2023] losartan   100 mg Oral Daily   losartan   50 mg Oral Once   metoprolol  succinate  50 mg Oral Daily   predniSONE   20 mg Oral Q breakfast   sevelamer  carbonate  2,400 mg Oral TID WC   Assessment/Recommendations: Sheri Cooper is a/an 73 y.o. female with a past medical history notable for ESRD on HD admitted with shortness of breath    # ESRD: HD tomorrow on TTS schedule. Dialysis prescription adjusted based on labs.  Standing post weights to be conveyed to outpt HD - 8/12 40.1kg post tx.    # Volume/ hypertension:  frequent problems with volume overload.  Tolerated UF 2.8L yesterday.  Sounds like EDW isn't the issue, it's tolerating UF so we'll have to make sure she can get profile 2 with new HD machine outpt. BP remains high - increased losartan  to 100mg  daily this AM, cont other meds.  Does have a mod L pleural effusion too w plans for thoracentesis today.    # Anemia of Chronic Kidney Disease:  Hb 10s this AM. Monitor for now.    # Secondary Hyperparathyroidism/Hyperphosphatemia: calcium  corrects to normal.  Well controlled phosphorus continue home sevelamer .   # Vascular access: AVG with no issues   # Additional recommendations: - Dose all meds for creatinine clearance < 10 ml/min  - Unless absolutely necessary, no MRIs with gadolinium.  - Implement save arm precautions.  Prefer needle sticks in the dorsum of the hands or wrists.  No blood pressure measurements in arm. - If blood transfusion is requested during hemodialysis sessions, please alert us  prior to the session.  - Use synthetic opioids (Fentanyl /Dilaudid ) if needed  Manuelita Barters MD 12/14/2023, 8:52 AM  [facility] Pager: [phone]

## 2023-12-14 NOTE — Plan of Care (Signed)

## 2023-12-14 NOTE — Progress Notes (Signed)
 Patient brought to US  per stretcher in no acute distress. Thoracentesis discussed, consent obtained. Prepped and draped in sterile fashion. Access obtained from Left post thorax. 600cc dark amber fluid retirieved under vacutainer suction. Began to cough toward end of removal. Access withdrawn without complication. Bandaged, no bleeding no hematoma. Transported to Xray for chest film.

## 2023-12-14 NOTE — Progress Notes (Signed)
 Unable to obtain due to BMI  12/14/23 1000  ReDS Vest / Clip  Station Marker A  Ruler Value 18

## 2023-12-15 DIAGNOSIS — I10 Essential (primary) hypertension: Secondary | ICD-10-CM | POA: Diagnosis not present

## 2023-12-15 DIAGNOSIS — I5033 Acute on chronic diastolic (congestive) heart failure: Secondary | ICD-10-CM | POA: Diagnosis not present

## 2023-12-15 DIAGNOSIS — E8770 Fluid overload, unspecified: Secondary | ICD-10-CM | POA: Diagnosis not present

## 2023-12-15 LAB — CBC
HCT: 27.4 % — ABNORMAL LOW (ref 36.0–46.0)
Hemoglobin: 9 g/dL — ABNORMAL LOW (ref 12.0–15.0)
MCH: 27.5 pg (ref 26.0–34.0)
MCHC: 32.8 g/dL (ref 30.0–36.0)
MCV: 83.8 fL (ref 80.0–100.0)
Platelets: 341 K/uL (ref 150–400)
RBC: 3.27 MIL/uL — ABNORMAL LOW (ref 3.87–5.11)
RDW: 19.7 % — ABNORMAL HIGH (ref 11.5–15.5)
WBC: 15.8 K/uL — ABNORMAL HIGH (ref 4.0–10.5)
nRBC: 0 % (ref 0.0–0.2)

## 2023-12-15 LAB — BASIC METABOLIC PANEL WITH GFR
Anion gap: 18 — ABNORMAL HIGH (ref 5–15)
BUN: 76 mg/dL — ABNORMAL HIGH (ref 8–23)
CO2: 26 mmol/L (ref 22–32)
Calcium: 7.6 mg/dL — ABNORMAL LOW (ref 8.9–10.3)
Chloride: 92 mmol/L — ABNORMAL LOW (ref 98–111)
Creatinine, Ser: 6.42 mg/dL — ABNORMAL HIGH (ref 0.44–1.00)
GFR, Estimated: 6 mL/min — ABNORMAL LOW (ref >60)
Glucose, Bld: 82 mg/dL (ref 70–99)
Potassium: 4.2 mmol/L (ref 3.5–5.1)
Sodium: 136 mmol/L (ref 135–145)

## 2023-12-15 LAB — PATHOLOGIST SMEAR REVIEW

## 2023-12-15 MED ORDER — ACETAMINOPHEN 325 MG PO TABS: 650.0000 mg | ORAL_TABLET | Freq: Four times a day (QID) | ORAL | Status: AC | PRN

## 2023-12-15 MED ORDER — ALBUTEROL SULFATE (2.5 MG/3ML) 0.083% IN NEBU: 2.5000 mg | INHALATION_SOLUTION | Freq: Four times a day (QID) | RESPIRATORY_TRACT | 11 refills | Status: DC | PRN

## 2023-12-15 MED ORDER — AMLODIPINE BESYLATE 10 MG PO TABS: 10.0000 mg | ORAL_TABLET | Freq: Every day | ORAL | 5 refills | Status: DC

## 2023-12-15 MED ORDER — LOSARTAN POTASSIUM 50 MG PO TABS
50.0000 mg | ORAL_TABLET | Freq: Every day | ORAL | Status: DC
Start: 2023-12-15 — End: 2023-12-15

## 2023-12-15 MED ORDER — ALBUTEROL SULFATE HFA 108 (90 BASE) MCG/ACT IN AERS: 2.0000 | INHALATION_SPRAY | Freq: Four times a day (QID) | RESPIRATORY_TRACT | 2 refills | Status: DC | PRN

## 2023-12-15 NOTE — Care Management Important Message (Signed)
 Important Message  Patient Details  Name: Sheri Cooper MRN: [id number] Date of Birth: 1951-02-07   Important Message Given:  N/A - LOS <3 / Initial given by admissions     [name] 12/15/2023, 12:04 PM

## 2023-12-15 NOTE — Progress Notes (Signed)
   12/15/23 1505  Provider Notification  Provider Name/Title dr. courage  Date Provider Notified 12/15/23  Time Provider Notified 1505  Method of Notification Page  Notification Reason Red med refusal (heparin  refusal)  Provider response No new orders

## 2023-12-15 NOTE — Discharge Summary (Signed)
 Sheri Cooper, is a 73 y.o. female  DOB Apr 12, 1951  MRN [id number].  Admission date:  12/12/2023  Admitting Physician  Rendall Carwin, MD  Discharge Date:  12/15/2023   Primary MD  Toribio Jerel MATSU, MD  Recommendations for primary care physician for things to follow:  1)Very Low-salt diet advised---Less than 2 gm of Sodium per day advised----ok to use Mrs DASH salt substitute instead of Salt 2)Weigh yourself daily, call if you gain more than 3 pounds in 1 day or more than 5 pounds in 1 week as your hemodialysis schedule may need to be adjusted 3)Limit your Fluid  intake to no more than 40 ounces (1.2 Liters) per day 4)[facility] Driver will pick you up at 9:45 AM  and your Hemodialysis is scheduled for 10 am tomorrow (on Friday 12/15/2023)-- 5)Avoid ibuprofen/Advil/Aleve/Motrin/Goody Powders/Naproxen/BC powders/Meloxicam/Diclofenac/Indomethacin and other Nonsteroidal anti-inflammatory medications as these will make you more likely to bleed and can cause stomach ulcers, can also cause Kidney problems- 6)Please remind your outpatient hemodialysis team including your nephrologist that your New estimated dry weight (EDW) is 40 kg (your New EDW 40kg and use profile 2 for your outpatient hemodialysis sessions)  Admission Diagnosis  SOB (shortness of breath) [R06.02] End stage renal disease on dialysis (HCC) [N18.6, Z99.2] Fluid overload [E87.70] Volume overload [E87.70]   Discharge Diagnosis  SOB (shortness of breath) [R06.02] End stage renal disease on dialysis (HCC) [N18.6, Z99.2] Fluid overload [E87.70] Volume overload [E87.70]    Principal Problem:   Fluid overload Active Problems:   Acute on chronic heart failure with preserved ejection fraction (HCC)   Essential hypertension   COPD with acute exacerbation (HCC)   Volume overload      Past Medical History:  Diagnosis Date   Anemia    Chronic kidney  disease (CKD), stage IV (severe) (HCC)    COPD (chronic obstructive pulmonary disease) (HCC)    ESRD (end stage renal disease) (HCC)    Hemodialysis T, TH, SAT, [address] , [place]   GERD (gastroesophageal reflux disease)    History of blood transfusion    History of blood transfusion    Hypertension    Pneumonia    3 times in 2024-   PUD (peptic ulcer disease) 05/2022   Rheumatoid arthritis (HCC)    Rheumatoid arthritis (HCC)     Past Surgical History:  Procedure Laterality Date   A/V SHUNTOGRAM N/A 05/02/2023   Procedure: A/V SHUNTOGRAM;  Surgeon: Pearline Norman RAMAN, MD;  Location: [facility] INVASIVE CV LAB;  Service: Cardiovascular;  Laterality: N/A;   AV FISTULA PLACEMENT Left 11/29/2022   Procedure: INSERTION OF LEFT UPPER  ARTERIOVENOUS (AV) GORE-TEX GRAFT ARM;  Surgeon: Lanis Fonda BRAVO, MD;  Location: [facility] OR;  Service: Vascular;  Laterality: Left;   CENTRAL VENOUS CATHETER INSERTION Left 05/27/2022   Procedure: INSERTION CENTRAL LINE ADULT;  Surgeon: Kallie Manuelita BROCKS, MD;  Location: [facility] ORS;  Service: General;  Laterality: Left;   COLONOSCOPY WITH PROPOFOL  N/A 05/22/2022   Surgeon: Eartha Angelia Toribio, MD; blood in the terminal  ileum and colon.   ESOPHAGOGASTRODUODENOSCOPY (EGD) WITH PROPOFOL  N/A 05/22/2022   Surgeon: Eartha Flavors, Toribio, MD; peptic ulcer disease with 2 nonbleeding cratered gastric ulcers, multiple duodenal ulcers, 2 of which had adherent clots that were removed and revealed 2 vessels.  1 vessel was clipped, cauterized, and treated with epinephrine  injection.  Second vessel was clipped and treated with cautery, clip fell off.   IR FLUORO GUIDE CV LINE RIGHT  05/24/2022   IR US  GUIDE VASC ACCESS RIGHT  05/24/2022   LAPAROTOMY N/A 05/27/2022   Procedure: EXPLORATORY LAPAROTOMY, OMENTAL PATH, DRAIN PLACEMENT;  Surgeon: Kallie Manuelita BROCKS, MD;  Location: [facility] ORS;  Service: General;  Laterality: N/A;   PERIPHERAL VASCULAR BALLOON ANGIOPLASTY  05/02/2023   Procedure:  PERIPHERAL VASCULAR BALLOON ANGIOPLASTY;  Surgeon: Pearline Norman RAMAN, MD;  Location: [facility] INVASIVE CV LAB;  Service: Cardiovascular;;   PERIPHERAL VASCULAR THROMBECTOMY Left 08/18/2023   Procedure: PERIPHERAL VASCULAR THROMBECTOMY;  Surgeon: Melia Lynwood ORN, MD;  Location: [facility] INVASIVE CV LAB;  Service: Cardiovascular;  Laterality: Left;   VENOUS ANGIOPLASTY Left 08/18/2023   Procedure: VENOUS ANGIOPLASTY;  Surgeon: Melia Lynwood ORN, MD;  Location: [facility] INVASIVE CV LAB;  Service: Cardiovascular;  Laterality: Left;  80% Intragraft; 50% Venous Anastomosis   VOLVULUS REDUCTION     colon resection       HPI  from the history and physical done on the day of admission:   Sheri Cooper is a 73 y.o. African-American female with medical history significant for COPD, end-stage renal disease on hemodialysis, GERD, hypertension, rheumatoid arthritis, PUD, and anemia, who presented to the emergency room with acute onset of worsening dyspnea with associated orthopnea, paroxysmal nocturnal dyspnea and dyspnea on exertion.  She admits to dry cough with occasional expectoration of clear sputum as well as occasional wheezing.  She denied any fever or chills.  No chest pain or palpitations.  No nausea or vomiting or abdominal pain.   ED Course: When she came to the ER, respiratory rate was 23 and BP 174/22 with otherwise normal vital signs.  Labs revealed a CO2 of 18 and BUN of 52 with a creatinine of 6.53 and calcium  7.9 with anion gap of 17.  BNP was 2354 and CBC showed leukocytosis of 18.1 with anemia. EKG as reviewed by me : EKG showed normal sinus rhythm with a rate of 93 with prolonged QT interval with QTc of 538 mL. Imaging: Portable chest x-ray showed stable cardiomegaly with interstitial thickening suspicious for pulmonary edema that is unchanged from earlier during the day.  It showed unchanged bilateral pleural effusions, left greater than right with associated by basilar opacities.   The patient was given DuoNeb  1 mg of p.o. Ativan  and will be admitted to a medical telemetry observation bed for further evaluation and management.   Hospital Course:   Assessment and Plan: Brief Narrative:  73 y.o. 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-Tuesday Thursday Saturday,  -Volume status and hypoxia improved after hemodialysis session and left-sided thoracentesis -Tolerated hemodialysis well on 12/13/2023 with removal of 2.8 kg (patient weighs only 40 kg) - Nephrology consult appreciated - Due to high volume of inpatient dialysis on 12/15/2023 nephrologist arranged for patient to get outpatient hemodialysis either in the [facility]ita on 12/16/2023 off schedule  2)Acute on chronic heart failure with preserved ejection fraction -- -BNP greater than 2300 on admission -Echo from 09/27/2023 with EF of 60 to 65% -- On admission chest x-ray with bilateral pleural effusions, postthoracentesis repeat chest x-ray on 12/14/2023 with improvement in left-sided pleural effusion - Hemodialysis as above #1 - IR consult for left-sided thoracentesis--please see #6 below - Dyspnea improved post left-sided thoracentesis and hemodialysis -Fluid restriction was advised   3) acute hypoxic respiratory failure--due to #1 #2 above - Hypoxia resolved after hemodialysis on 12/13/2023 and after left-sided thoracentesis on 12/14/2023  -Post ambulation O2 sats 94 to 95% on room air   4) chronic anemia of ESRD--Hgb stable around 9  --defer to nephrology service timing of ESA/Procrit agents   5)HTN-continue losartan  and hydralazine  as well as amlodipine  COPD with acute exacerbation (HCC) - She was recently given doxycycline  and prednisone  taper for recent  exacerbation.   - Continue mucolytics and bronchodilators   6)Bilateral pleural effusion left more than right--see #2 above - Status post IR left-sided thoracentesis on 12/14/2023--cell count low - Fluid analysis by lights criteria consistent with transudate - With pleural fluid LDH of 102 and  serum LDH of 183 = 0.56 - Continue to use hemodialysis to address pulm status    Disposition: The patient is from: Home              Anticipated d/c is to: Home  Discharge Condition: Stable  Follow UP   Follow-up Information     [place], [facility]. Go on 12/16/2023.   Why: Please arrive at 10:30am for dialysis appointment at [facility]. Your transportation is picking you up at 9:45am. Contact information: [address] [place] [address] [phone]                  Consults obtained -nephrology and IR  Diet and Activity recommendation:  As advised  Discharge Instructions    Discharge Instructions     Call MD for:  difficulty breathing, headache or visual disturbances   Complete by: As directed    Call MD for:  persistant dizziness or light-headedness   Complete by: As directed    Call MD for:  persistant nausea and vomiting   Complete by: As directed    Call MD for:  temperature >100.4   Complete by: As directed    Diet - low sodium heart healthy   Complete by: As directed    Discharge instructions   Complete by: As directed    1)Very Low-salt diet advised---Less than 2 gm of Sodium per day advised----ok to use Mrs DASH salt substitute instead of Salt 2)Weigh yourself daily, call if you gain more than 3 pounds in 1 day or more than 5 pounds in 1 week as your hemodialysis schedule may need to be adjusted 3)Limit your Fluid  intake to no more than 40 ounces (1.2 Liters) per day 4)[facility] Driver will pick you up at 9:45 AM  and your Hemodialysis is scheduled for 10 am tomorrow (on Friday 12/15/2023)-- 5)Avoid ibuprofen/Advil/Aleve/Motrin/Goody  Powders/Naproxen/BC powders/Meloxicam/Diclofenac/Indomethacin and other Nonsteroidal anti-inflammatory medications as these will make you more likely to bleed and can cause stomach ulcers, can also cause Kidney problems- 6)Please remind your outpatient hemodialysis team including your nephrologist that your New estimated dry weight (EDW) is 40 kg (your New EDW 40kg and use profile 2 for your outpatient hemodialysis sessions)   Increase activity slowly   Complete by: As directed  Discharge Medications     Allergies as of 12/15/2023   Not on File      Medication List     STOP taking these medications    doxycycline  100 MG tablet Commonly known as: VIBRA -TABS   predniSONE  20 MG tablet Commonly known as: DELTASONE        TAKE these medications    acetaminophen  325 MG tablet Commonly known as: TYLENOL  Take 2 tablets (650 mg total) by mouth every 6 (six) hours as needed for mild pain (pain score 1-3) or fever (or Fever >/= 101).   albuterol  (2.5 MG/3ML) 0.083% nebulizer solution Commonly known as: PROVENTIL  Take 3 mLs (2.5 mg total) by nebulization every 6 (six) hours as needed for wheezing or shortness of breath.   albuterol  108 (90 Base) MCG/ACT inhaler Commonly known as: VENTOLIN  HFA Inhale 2 puffs into the lungs every 6 (six) hours as needed for wheezing or shortness of breath.   amLODipine  10 MG tablet Commonly known as: NORVASC  Take 1 tablet (10 mg total) by mouth daily.   ferrous sulfate  325 (65 FE) MG EC tablet Take 325 mg by mouth 3 (three) times a week. Tuesday, Thursday, and Saturday at dialysis   hydrALAZINE  50 MG tablet Commonly known as: APRESOLINE  Take 1 tablet (50 mg total) by mouth 2 (two) times daily.   losartan  50 MG tablet Commonly known as: COZAAR  Take 1 tablet (50 mg total) by mouth daily.   metoprolol  succinate 50 MG 24 hr tablet Commonly known as: TOPROL -XL Take 50 mg by mouth daily.   sevelamer  carbonate 800 MG tablet Commonly  known as: RENVELA  Take 2,400 mg by mouth 3 (three) times daily with meals.        Major procedures and Radiology Reports - PLEASE review detailed and final reports for all details, in brief -   US  THORACENTESIS ASP PLEURAL SPACE W/IMG GUIDE Result Date: 12/15/2023 INDICATION: Patient with a history of end-stage renal disease and heart failure presents today with bilateral pleural effusions. Interventional Radiology asked to perform a diagnostic and therapeutic thoracentesis. EXAM: ULTRASOUND GUIDED THORACENTESIS MEDICATIONS: % lidocaine  10 mL COMPLICATIONS: None immediate. PROCEDURE: An ultrasound guided thoracentesis was thoroughly discussed with the patient and questions answered. The benefits, risks, alternatives and complications were also discussed. The patient understands and wishes to proceed with the procedure. Written consent was obtained. Ultrasound was performed to localize and mark an adequate pocket of fluid in the left chest. The area was then prepped and draped in the normal sterile fashion. 1% Lidocaine  was used for local anesthesia. Under ultrasound guidance a 6 Fr Safe-T-Centesis catheter was introduced. Thoracentesis was performed. The catheter was removed and a dressing applied. FINDINGS: A total of approximately 600 mL of clear yellow fluid was removed. Samples were sent to the laboratory as requested by the clinical team. IMPRESSION: Successful ultrasound guided right thoracentesis yielding 600 mL of pleural fluid. Procedure performed by Warren Dais, NP Electronically Signed   By: Ester Sides M.D.   On: 12/15/2023 06:00   DG Chest 1 View Result Date: 12/14/2023 CLINICAL DATA:  [id number] Status post thoracentesis [id number] EXAM: CHEST  1 VIEW COMPARISON:  December 12, 2023 FINDINGS: Unchanged small right pleural effusion. Interval decrease in size of the left pleural effusion, now small in volume. Patchy opacities in both lung bases, likely atelectasis. No pneumothorax. Moderate  cardiomegaly. Tortuous aorta with aortic atherosclerosis. No acute fracture or destructive lesions. Multilevel thoracic osteophytosis. Surgical clips in the left breast. IMPRESSION: 1. Interval decrease in size  of the left pleural effusion, now small in volume. No pneumothorax. 2. Unchanged small right pleural effusion. Electronically Signed   By: Rogelia Myers M.D.   On: 12/14/2023 16:18   DG Chest Portable 1 View Result Date: 12/12/2023 CLINICAL DATA:  Shortness of breath.  Bilateral feet numbness. EXAM: PORTABLE CHEST 1 VIEW COMPARISON:  Radiograph earlier today FINDINGS: Stable cardiomegaly. Unchanged mediastinal contours. Unchanged bilateral pleural effusions, left greater than right with associated bibasilar opacities. Interstitial thickening suspicious for pulmonary edema, also unchanged. No pneumothorax. Left breast/axillary surgical clips. IMPRESSION: 1. Unchanged bilateral pleural effusions, left greater than right with associated bibasilar opacities. 2. Stable cardiomegaly. Unchanged interstitial thickening suspicious for pulmonary edema. Electronically Signed   By: Andrea Gasman M.D.   On: 12/12/2023 22:57   DG Chest Portable 1 View Result Date: 12/12/2023 CLINICAL DATA:  Shortness of breath and decreased breath sounds. EXAM: PORTABLE CHEST 1 VIEW COMPARISON:  Portable chest 12/05/2023 FINDINGS: Moderate pleural effusions on the left greater than right continue to be seen, not significantly changed. Stable mild cardiomegaly, mild lower zonal interstitial edema and central vascular prominence. There are bibasilar opacities overlying the pleural effusions which could be atelectasis or consolidation and are also unchanged. The remaining lungs appear clear. The mediastinum is normally outlined. Aortic atherosclerosis. No new osseous finding.  Left axillary surgical clips. IMPRESSION: 1. No significant change in the appearance of the chest from 1 week ago. 2. Moderate pleural effusions on the left  greater than right, with overlying atelectasis or consolidation. 3. Stable cardiomegaly and mild interstitial edema. Electronically Signed   By: Francis Quam M.D.   On: 12/12/2023 00:59   DG Chest Port 1 View Result Date: 12/05/2023 CLINICAL DATA:  Shortness of breath. EXAM: PORTABLE CHEST 1 VIEW COMPARISON:  Radiograph 2 days ago 12/03/2023 FINDINGS: Again seen cardiomegaly. Moderate pleural effusions are unchanged from recent exam. Again seen diffuse interstitial thickening suspicious for pulmonary edema. Bibasilar opacities are unchanged. No pneumothorax. Left chest wall/axillary surgical clips. IMPRESSION: 1. Findings again suggestive of CHF. Unchanged cardiomegaly, moderate pleural effusions, and diffuse interstitial thickening suspicious for pulmonary edema. 2. Bibasilar opacities are unchanged. Electronically Signed   By: Andrea Gasman M.D.   On: 12/05/2023 21:18   DG Chest Portable 1 View Result Date: 12/03/2023 EXAM: 1 VIEW XRAY OF THE CHEST 12/03/2023 03:18:00 AM COMPARISON: 09/26/2023 CLINICAL HISTORY: Dyspnea. FINDINGS: LUNGS AND PLEURA: Moderate bilateral pleural effusions. Pulmonary vascular congestion. Interstitial edema. Bibasilar airspace opacities. No pneumothorax. HEART AND MEDIASTINUM: Stable cardiomegaly. Aortic atherosclerotic calcification. BONES AND SOFT TISSUES: No acute osseous abnormality. IMPRESSION: 1. Findings most suggestive of CHF with moderate bilateral pleural effusions similar to prior. Multifocal pneumonia could appear similarly. Electronically signed by: Norman Gatlin MD 12/03/2023 03:26 AM EDT RP Workstation: [id number]   Micro Results  Recent Results (from the past 240 hours)  Resp panel by RT-PCR (RSV, Flu A&B, Covid) Anterior Nasal Swab     Status: None   Collection Time: 12/05/23  8:18 PM   Specimen: Anterior Nasal Swab  Result Value Ref Range Status   SARS Coronavirus 2 by RT PCR NEGATIVE NEGATIVE Final    Comment: (NOTE) SARS-CoV-2 target nucleic acids  are NOT DETECTED.  The SARS-CoV-2 RNA is generally detectable in upper respiratory specimens during the acute phase of infection. The lowest concentration of SARS-CoV-2 viral copies this assay can detect is 138 copies/mL. A negative result does not preclude SARS-Cov-2 infection and should not be used as the sole basis for treatment or other patient management  decisions. A negative result may occur with  improper specimen collection/handling, submission of specimen other than nasopharyngeal swab, presence of viral mutation(s) within the areas targeted by this assay, and inadequate number of viral copies(<138 copies/mL). A negative result must be combined with clinical observations, patient history, and epidemiological information. The expected result is Negative.  Fact Sheet for Patients:  BloggerCourse.com  Fact Sheet for Healthcare Providers:  SeriousBroker.it  This test is no t yet approved or cleared by the [place]  FDA and  has been authorized for detection and/or diagnosis of SARS-CoV-2 by FDA under an Emergency Use Authorization (EUA). This EUA will remain  in effect (meaning this test can be used) for the duration of the COVID-19 declaration under Section 564(b)(1) of the Act, 21 U.S.C.section 360bbb-3(b)(1), unless the authorization is terminated  or revoked sooner.       Influenza A by PCR NEGATIVE NEGATIVE Final   Influenza B by PCR NEGATIVE NEGATIVE Final    Comment: (NOTE) The Xpert Xpress SARS-CoV-2/FLU/RSV plus assay is intended as an aid in the diagnosis of influenza from Nasopharyngeal swab specimens and should not be used as a sole basis for treatment. Nasal washings and aspirates are unacceptable for Xpert Xpress SARS-CoV-2/FLU/RSV testing.  Fact Sheet for Patients: BloggerCourse.com  Fact Sheet for Healthcare Providers: SeriousBroker.it  This test is  not yet approved or cleared by the [place]  FDA and has been authorized for detection and/or diagnosis of SARS-CoV-2 by FDA under an Emergency Use Authorization (EUA). This EUA will remain in effect (meaning this test can be used) for the duration of the COVID-19 declaration under Section 564(b)(1) of the Act, 21 U.S.C. section 360bbb-3(b)(1), unless the authorization is terminated or revoked.     Resp Syncytial Virus by PCR NEGATIVE NEGATIVE Final    Comment: (NOTE) Fact Sheet for Patients: BloggerCourse.com  Fact Sheet for Healthcare Providers: SeriousBroker.it  This test is not yet approved or cleared by the [place]  FDA and has been authorized for detection and/or diagnosis of SARS-CoV-2 by FDA under an Emergency Use Authorization (EUA). This EUA will remain in effect (meaning this test can be used) for the duration of the COVID-19 declaration under Section 564(b)(1) of the Act, 21 U.S.C. section 360bbb-3(b)(1), unless the authorization is terminated or revoked.  Performed at [facility], [address] [place], [place] [address]   Gram stain     Status: None   Collection Time: 12/14/23  2:20 PM   Specimen: Pleura  Result Value Ref Range Status   Specimen Description PLEURAL  Final   Special Requests PLEURAL  Final   Gram Stain   Final    PLEURAL WBC PRESENT,BOTH PMN AND MONONUCLEAR NO ORGANISMS SEEN CYTOSPIN SMEAR Performed at [facility], [address], [place], [place] [address]    Report Status 12/14/2023 FINAL  Final    Today   Subjective    Ziyan Cureton today has no new complaints - Dyspnea and hypoxia resolved - Post ambulation O2 sats on room air 94 to 95%   Patient has been seen and examined prior to discharge   Objective   Blood pressure (!) 158/43, pulse 66, temperature 97.8 F (36.6 C), temperature source Oral, resp. rate 20, height 4' 11 (1.499 m), weight 40.9 kg, SpO2  97%.   Intake/Output Summary (Last 24 hours) at 12/15/2023 1459 Last data filed at 12/15/2023 1300 Gross per 24 hour  Intake 840 ml  Output --  Net 840 ml    Exam Gen:- Awake Alert,  no acute distress  HEENT:- [place].AT, No sclera icterus Neck-Supple Neck,No JVD,.  Lungs-improved air movement especially on the left, no wheezing  CV- S1, S2 normal, regular Abd-  +ve B.Sounds, Abd Soft, No tenderness,    Extremity/Skin:- No  edema,   good pulses Psych-affect is appropriate, oriented x3 Neuro-no new focal deficits, no tremors  MSK-LUE AVG +t/b    Data Review   CBC w Diff:  Lab Results  Component Value Date   WBC 15.8 (H) 12/15/2023   HGB 9.0 (L) 12/15/2023   HCT 27.4 (L) 12/15/2023   PLT 341 12/15/2023   LYMPHOPCT 13 12/08/2023   MONOPCT 7 12/08/2023   EOSPCT 0 12/08/2023   BASOPCT 0 12/08/2023    CMP:  Lab Results  Component Value Date   NA 136 12/15/2023   K 4.2 12/15/2023   CL 92 (L) 12/15/2023   CO2 26 12/15/2023   BUN 76 (H) 12/15/2023   CREATININE 6.42 (H) 12/15/2023   PROT 6.7 12/05/2023   ALBUMIN  3.8 12/14/2023   BILITOT 0.7 12/05/2023   ALKPHOS 95 12/05/2023   AST 17 12/05/2023   ALT 8 12/05/2023  .  Total Discharge time is about 33 minutes  Rendall Carwin M.D on 12/15/2023 at 2:59 PM  Go to [email or web address] -  for contact info  [facility] - Office  [phone]

## 2023-12-15 NOTE — Plan of Care (Signed)

## 2023-12-15 NOTE — Discharge Instructions (Addendum)
 1)Very Low-salt diet advised---Less than 2 gm of Sodium per day advised----ok to use Mrs DASH salt substitute instead of Salt 2)Weigh yourself daily, call if you gain more than 3 pounds in 1 day or more than 5 pounds in 1 week as your hemodialysis schedule may need to be adjusted 3)Limit your Fluid  intake to no more than 40 ounces (1.2 Liters) per day 4)[facility] Driver will pick you up at 9:45 AM  and your Hemodialysis is scheduled for 10 am tomorrow (on Friday 12/15/2023)-- 5)Avoid ibuprofen/Advil/Aleve/Motrin/Goody Powders/Naproxen/BC powders/Meloxicam/Diclofenac/Indomethacin and other Nonsteroidal anti-inflammatory medications as these will make you more likely to bleed and can cause stomach ulcers, can also cause Kidney problems- 6)Please remind your outpatient hemodialysis team including your nephrologist that your New estimated dry weight (EDW) is 40 kg (your New EDW 40kg and use profile 2 for your outpatient hemodialysis sessions)

## 2023-12-15 NOTE — Progress Notes (Signed)
 Dialysis on day of discharge occurring due to pt demands as well as limiting transportation factors. Nephrology notified, will continue to assist as needed.   Lavanda Armanie Martine Dialysis Navigator [phone]

## 2023-12-15 NOTE — Progress Notes (Signed)
 [facility] Progress Note   Subjective:   Had thoracentesis with IR 0.6L yesterday and feels improved.  No new issues.   Objective Vitals:   12/14/23 2017 12/15/23 0507 12/15/23 0803 12/15/23 0854  BP:  (!) 142/50  (!) 132/42  Pulse:  62  64  Resp:  16    Temp:  98.2 F (36.8 C)    TempSrc:  Oral    SpO2: 94% 97% 97%   Weight:  40.9 kg    Height:       Physical Exam General: eating breakfast vigorously Heart: RRR Lungs: normal WOB on RA this AM Extremities: no edema Dialysis Access: LUE AVG +t/b  Additional Objective Labs: Basic Metabolic Panel: Recent Labs  Lab 12/13/23 0435 12/14/23 0513 12/15/23 0440  NA 139 137 136  K 4.6 4.4 4.2  CL 107 98 92*  CO2 17* 25 26  GLUCOSE 118* 87 82  BUN 77* 38* 76*  CREATININE 7.97* 4.56* 6.42*  CALCIUM  7.7* 8.3* 7.6*  PHOS  --  4.6  --    Liver Function Tests: Recent Labs  Lab 12/14/23 0513  ALBUMIN  3.8   No results for input(s): LIPASE, AMYLASE in the last 168 hours. CBC: Recent Labs  Lab 12/12/23 0025 12/13/23 0435 12/14/23 0513 12/15/23 0440  WBC 18.1* 13.1* 13.2* 15.8*  HGB 9.9* 8.9* 10.1* 9.0*  HCT 29.6* 26.9* 30.9* 27.4*  MCV 85.1 85.9 84.4 83.8  PLT 316 316 338 341   Blood Culture    Component Value Date/Time   SDES PLEURAL 12/14/2023 1420   SPECREQUEST PLEURAL 12/14/2023 1420   CULT (A) 05/28/2022 1005    >=100,000 COLONIES/mL ENTEROCOCCUS FAECIUM VANCOMYCIN  RESISTANT ENTEROCOCCUS ISOLATED    REPTSTATUS 12/14/2023 FINAL 12/14/2023 1420    Cardiac Enzymes: No results for input(s): CKTOTAL, CKMB, CKMBINDEX, TROPONINI in the last 168 hours. CBG: No results for input(s): GLUCAP in the last 168 hours. Iron  Studies: No results for input(s): IRON , TIBC, TRANSFERRIN, FERRITIN in the last 72 hours. @lablastinr3 @ Studies/Results: US  THORACENTESIS ASP PLEURAL SPACE W/IMG GUIDE Result Date: 12/15/2023 INDICATION: Patient with a history of end-stage renal disease  and heart failure presents today with bilateral pleural effusions. Interventional Radiology asked to perform a diagnostic and therapeutic thoracentesis. EXAM: ULTRASOUND GUIDED THORACENTESIS MEDICATIONS: % lidocaine  10 mL COMPLICATIONS: None immediate. PROCEDURE: An ultrasound guided thoracentesis was thoroughly discussed with the patient and questions answered. The benefits, risks, alternatives and complications were also discussed. The patient understands and wishes to proceed with the procedure. Written consent was obtained. Ultrasound was performed to localize and mark an adequate pocket of fluid in the left chest. The area was then prepped and draped in the normal sterile fashion. 1% Lidocaine  was used for local anesthesia. Under ultrasound guidance a 6 Fr Safe-T-Centesis catheter was introduced. Thoracentesis was performed. The catheter was removed and a dressing applied. FINDINGS: A total of approximately 600 mL of clear yellow fluid was removed. Samples were sent to the laboratory as requested by the clinical team. IMPRESSION: Successful ultrasound guided right thoracentesis yielding 600 mL of pleural fluid. Procedure performed by Warren Dais, NP Electronically Signed   By: Ester Sides M.D.   On: 12/15/2023 06:00   DG Chest 1 View Result Date: 12/14/2023 CLINICAL DATA:  [id number] Status post thoracentesis [id number] EXAM: CHEST  1 VIEW COMPARISON:  December 12, 2023 FINDINGS: Unchanged small right pleural effusion. Interval decrease in size of the left pleural effusion, now small in volume. Patchy opacities in both lung bases, likely atelectasis.  No pneumothorax. Moderate cardiomegaly. Tortuous aorta with aortic atherosclerosis. No acute fracture or destructive lesions. Multilevel thoracic osteophytosis. Surgical clips in the left breast. IMPRESSION: 1. Interval decrease in size of the left pleural effusion, now small in volume. No pneumothorax. 2. Unchanged small right pleural effusion. Electronically  Signed   By: Rogelia Myers M.D.   On: 12/14/2023 16:18   Medications:   amLODipine   10 mg Oral Daily   Chlorhexidine  Gluconate Cloth  6 each Topical Q0600   doxycycline   100 mg Oral Q12H   ferrous sulfate   325 mg Oral Q T,Th,Sat-1800   heparin   5,000 Units Subcutaneous Q8H   hydrALAZINE   50 mg Oral BID   ipratropium-albuterol   3 mL Nebulization BID   losartan   100 mg Oral Daily   metoprolol  succinate  50 mg Oral Daily   predniSONE   20 mg Oral Q breakfast   sevelamer  carbonate  2,400 mg Oral TID WC   Assessment/Recommendations: Sheri Cooper is a/an 73 y.o. female with a past medical history notable for ESRD on HD admitted with shortness of breath    # ESRD: HD today on TTS schedule. Dialysis prescription adjusted based on labs.  Standing post weights to be conveyed to outpt HD - 8/12 40.1kg post tx.  Primary to convey to outpt unit MD new EDW 40kg and use profile 2.   # Volume/ hypertension: frequent problems with volume overload.  Tolerated UF 2.8L 8/13 + had 0.6L thoracentesis 8/13  Feels much better.  Will reset EDW 40kg.  Adjusted antiHTN meds while admitted due to chronic severe HTN - BP much better today.    # Anemia of Chronic Kidney Disease:  Hb 9-10s. Monitor for now.    # Secondary Hyperparathyroidism/Hyperphosphatemia: calcium  corrects to normal.  Well controlled phosphorus continue home sevelamer .   # Vascular access: AVG with no issues   # Additional recommendations: - Dose all meds for creatinine clearance < 10 ml/min  - Unless absolutely necessary, no MRIs with gadolinium.  - Implement save arm precautions.  Prefer needle sticks in the dorsum of the hands or wrists.  No blood pressure measurements in arm. - If blood transfusion is requested during hemodialysis sessions, please alert us  prior to the session.  - Use synthetic opioids (Fentanyl /Dilaudid ) if needed  Manuelita Barters MD 12/15/2023, 9:35 AM  [facility] Pager: [phone]

## 2023-12-15 NOTE — Progress Notes (Signed)
 Contacted by nephrologist regarding finding seating at [facility] on an off schedule day due to high volume of HD at hospital today. Collaborated with clinic and [facility] transportation at this time. Pt now has an 11am appointment at [facility] for op HD tomorrow, the 15th. She will be picked up by transport at her home at 9:45am.   Spoke with pt bedside about this, pt was agreeable to this appointment at this time. Pt mentioned not having a ride home today if d/c, navigator notified SW at this time.  AVS updated with new appointment.  Lavanda Catia Todorov Dialysis Navigator [phone]

## 2023-12-16 ENCOUNTER — Telehealth: Payer: Self-pay

## 2023-12-16 DIAGNOSIS — N186 End stage renal disease: Secondary | ICD-10-CM | POA: Diagnosis not present

## 2023-12-16 DIAGNOSIS — Z992 Dependence on renal dialysis: Secondary | ICD-10-CM | POA: Diagnosis not present

## 2023-12-16 NOTE — Transitions of Care (Post Inpatient/ED Visit) (Signed)
   12/16/2023  Name: Sheri Cooper MRN: [id number] DOB: 11/01/1950  Today's TOC FU Call Status: Today's TOC FU Call Status:: Unsuccessful Call (1st Attempt) Unsuccessful Call (1st Attempt) Date: 12/16/23  Attempted to reach the patient regarding the most recent Inpatient/ED visit.  Follow Up Plan: Additional outreach attempts will be made to reach the patient to complete the Transitions of Care (Post Inpatient/ED visit) call.   [name] RN, CCM [place]  VBCI-Population Health RN Care Manager [phone]

## 2023-12-17 DIAGNOSIS — Z992 Dependence on renal dialysis: Secondary | ICD-10-CM | POA: Diagnosis not present

## 2023-12-17 DIAGNOSIS — N186 End stage renal disease: Secondary | ICD-10-CM | POA: Diagnosis not present

## 2023-12-19 ENCOUNTER — Telehealth: Payer: Self-pay

## 2023-12-19 NOTE — Transitions of Care (Post Inpatient/ED Visit) (Signed)
   12/19/2023  Name: Sheri Cooper MRN: [id number] DOB: 04-19-1951  Today's TOC FU Call Status: Today's TOC FU Call Status:: Successful TOC FU Call Completed TOC FU Call Complete Date: 12/19/23 (Patient with patient who declined to participate in [facility] program) Patient's Name and Date of Birth confirmed.  [name] RN, CCM [place]  VBCI-Population Health RN Care Manager [phone]

## 2023-12-20 DIAGNOSIS — N186 End stage renal disease: Secondary | ICD-10-CM | POA: Diagnosis not present

## 2023-12-20 DIAGNOSIS — Z992 Dependence on renal dialysis: Secondary | ICD-10-CM | POA: Diagnosis not present

## 2023-12-22 DIAGNOSIS — N186 End stage renal disease: Secondary | ICD-10-CM | POA: Diagnosis not present

## 2023-12-22 DIAGNOSIS — Z992 Dependence on renal dialysis: Secondary | ICD-10-CM | POA: Diagnosis not present

## 2023-12-24 DIAGNOSIS — N186 End stage renal disease: Secondary | ICD-10-CM | POA: Diagnosis not present

## 2023-12-24 DIAGNOSIS — Z992 Dependence on renal dialysis: Secondary | ICD-10-CM | POA: Diagnosis not present

## 2023-12-27 DIAGNOSIS — N186 End stage renal disease: Secondary | ICD-10-CM | POA: Diagnosis not present

## 2023-12-27 DIAGNOSIS — Z992 Dependence on renal dialysis: Secondary | ICD-10-CM | POA: Diagnosis not present

## 2023-12-29 DIAGNOSIS — Z992 Dependence on renal dialysis: Secondary | ICD-10-CM | POA: Diagnosis not present

## 2023-12-29 DIAGNOSIS — N186 End stage renal disease: Secondary | ICD-10-CM | POA: Diagnosis not present

## 2023-12-31 DIAGNOSIS — N186 End stage renal disease: Secondary | ICD-10-CM | POA: Diagnosis not present

## 2023-12-31 DIAGNOSIS — Z992 Dependence on renal dialysis: Secondary | ICD-10-CM | POA: Diagnosis not present

## 2024-01-01 DIAGNOSIS — N186 End stage renal disease: Secondary | ICD-10-CM | POA: Diagnosis not present

## 2024-01-01 DIAGNOSIS — Z992 Dependence on renal dialysis: Secondary | ICD-10-CM | POA: Diagnosis not present

## 2024-01-02 ENCOUNTER — Emergency Department (HOSPITAL_COMMUNITY)

## 2024-01-02 ENCOUNTER — Encounter (HOSPITAL_COMMUNITY): Payer: Self-pay

## 2024-01-02 ENCOUNTER — Other Ambulatory Visit: Payer: Self-pay

## 2024-01-02 ENCOUNTER — Inpatient Hospital Stay (HOSPITAL_COMMUNITY)
Admission: EM | Admit: 2024-01-02 | Discharge: 2024-01-04 | DRG: 291 | Disposition: A | Discharge status: Home / Self Care | Attending: Family Medicine | Admitting: Family Medicine

## 2024-01-02 DIAGNOSIS — N2581 Secondary hyperparathyroidism of renal origin: Secondary | ICD-10-CM | POA: Diagnosis present

## 2024-01-02 DIAGNOSIS — I509 Heart failure, unspecified: Secondary | ICD-10-CM | POA: Diagnosis not present

## 2024-01-02 DIAGNOSIS — R0602 Shortness of breath: Principal | ICD-10-CM

## 2024-01-02 DIAGNOSIS — Z79899 Other long term (current) drug therapy: Secondary | ICD-10-CM | POA: Diagnosis not present

## 2024-01-02 DIAGNOSIS — Z87891 Personal history of nicotine dependence: Secondary | ICD-10-CM | POA: Diagnosis not present

## 2024-01-02 DIAGNOSIS — J449 Chronic obstructive pulmonary disease, unspecified: Secondary | ICD-10-CM | POA: Diagnosis not present

## 2024-01-02 DIAGNOSIS — Z9889 Other specified postprocedural states: Secondary | ICD-10-CM

## 2024-01-02 DIAGNOSIS — K219 Gastro-esophageal reflux disease without esophagitis: Secondary | ICD-10-CM | POA: Diagnosis not present

## 2024-01-02 DIAGNOSIS — R918 Other nonspecific abnormal finding of lung field: Secondary | ICD-10-CM | POA: Diagnosis not present

## 2024-01-02 DIAGNOSIS — I499 Cardiac arrhythmia, unspecified: Secondary | ICD-10-CM | POA: Diagnosis not present

## 2024-01-02 DIAGNOSIS — I517 Cardiomegaly: Secondary | ICD-10-CM | POA: Diagnosis not present

## 2024-01-02 DIAGNOSIS — J9811 Atelectasis: Secondary | ICD-10-CM | POA: Diagnosis not present

## 2024-01-02 DIAGNOSIS — R069 Unspecified abnormalities of breathing: Secondary | ICD-10-CM | POA: Diagnosis not present

## 2024-01-02 DIAGNOSIS — D649 Anemia, unspecified: Secondary | ICD-10-CM | POA: Diagnosis present

## 2024-01-02 DIAGNOSIS — I10 Essential (primary) hypertension: Secondary | ICD-10-CM | POA: Diagnosis present

## 2024-01-02 DIAGNOSIS — I132 Hypertensive heart and chronic kidney disease with heart failure and with stage 5 chronic kidney disease, or end stage renal disease: Principal | ICD-10-CM | POA: Diagnosis present

## 2024-01-02 DIAGNOSIS — M069 Rheumatoid arthritis, unspecified: Secondary | ICD-10-CM | POA: Diagnosis not present

## 2024-01-02 DIAGNOSIS — I5033 Acute on chronic diastolic (congestive) heart failure: Secondary | ICD-10-CM | POA: Diagnosis not present

## 2024-01-02 DIAGNOSIS — N269 Renal sclerosis, unspecified: Secondary | ICD-10-CM | POA: Diagnosis not present

## 2024-01-02 DIAGNOSIS — J90 Pleural effusion, not elsewhere classified: Secondary | ICD-10-CM | POA: Diagnosis present

## 2024-01-02 DIAGNOSIS — D631 Anemia in chronic kidney disease: Secondary | ICD-10-CM | POA: Diagnosis present

## 2024-01-02 DIAGNOSIS — J9601 Acute respiratory failure with hypoxia: Secondary | ICD-10-CM | POA: Diagnosis not present

## 2024-01-02 DIAGNOSIS — N186 End stage renal disease: Secondary | ICD-10-CM | POA: Diagnosis not present

## 2024-01-02 DIAGNOSIS — Z992 Dependence on renal dialysis: Secondary | ICD-10-CM | POA: Diagnosis not present

## 2024-01-02 DIAGNOSIS — R188 Other ascites: Secondary | ICD-10-CM | POA: Diagnosis not present

## 2024-01-02 DIAGNOSIS — Z1152 Encounter for screening for COVID-19: Secondary | ICD-10-CM | POA: Diagnosis not present

## 2024-01-02 DIAGNOSIS — Z48813 Encounter for surgical aftercare following surgery on the respiratory system: Secondary | ICD-10-CM | POA: Diagnosis not present

## 2024-01-02 DIAGNOSIS — I120 Hypertensive chronic kidney disease with stage 5 chronic kidney disease or end stage renal disease: Secondary | ICD-10-CM | POA: Diagnosis not present

## 2024-01-02 DIAGNOSIS — R404 Transient alteration of awareness: Secondary | ICD-10-CM | POA: Diagnosis not present

## 2024-01-02 DIAGNOSIS — D638 Anemia in other chronic diseases classified elsewhere: Secondary | ICD-10-CM | POA: Diagnosis present

## 2024-01-02 DIAGNOSIS — I700 Atherosclerosis of aorta: Secondary | ICD-10-CM | POA: Diagnosis not present

## 2024-01-02 DIAGNOSIS — E782 Mixed hyperlipidemia: Secondary | ICD-10-CM | POA: Diagnosis not present

## 2024-01-02 DIAGNOSIS — R059 Cough, unspecified: Secondary | ICD-10-CM | POA: Diagnosis not present

## 2024-01-02 LAB — BLOOD GAS, VENOUS
Acid-Base Excess: 3.1 mmol/L — ABNORMAL HIGH (ref 0.0–2.0)
Bicarbonate: 25.1 mmol/L (ref 20.0–28.0)
Drawn by: 53361
O2 Saturation: 79 %
Patient temperature: 36.7
pCO2, Ven: 30 mmHg — ABNORMAL LOW (ref 44–60)
pH, Ven: 7.53 — ABNORMAL HIGH (ref 7.25–7.43)
pO2, Ven: 42 mmHg (ref 32–45)

## 2024-01-02 LAB — COMPREHENSIVE METABOLIC PANEL WITH GFR
ALT: 8 U/L (ref 0–44)
AST: 15 U/L (ref 15–41)
Albumin: 3 g/dL — ABNORMAL LOW (ref 3.5–5.0)
Alkaline Phosphatase: 89 U/L (ref 38–126)
Anion gap: 14 (ref 5–15)
BUN: 25 mg/dL — ABNORMAL HIGH (ref 8–23)
CO2: 21 mmol/L — ABNORMAL LOW (ref 22–32)
Calcium: 8.2 mg/dL — ABNORMAL LOW (ref 8.9–10.3)
Chloride: 107 mmol/L (ref 98–111)
Creatinine, Ser: 7.01 mg/dL — ABNORMAL HIGH (ref 0.44–1.00)
GFR, Estimated: 6 mL/min — ABNORMAL LOW (ref >60)
Glucose, Bld: 134 mg/dL — ABNORMAL HIGH (ref 70–99)
Potassium: 4.4 mmol/L (ref 3.5–5.1)
Sodium: 142 mmol/L (ref 135–145)
Total Bilirubin: 0.6 mg/dL (ref 0.0–1.2)
Total Protein: 6.5 g/dL (ref 6.5–8.1)

## 2024-01-02 LAB — RESP PANEL BY RT-PCR (RSV, FLU A&B, COVID)  RVPGX2
Influenza A by PCR: NEGATIVE
Influenza B by PCR: NEGATIVE
Resp Syncytial Virus by PCR: NEGATIVE
SARS Coronavirus 2 by RT PCR: NEGATIVE

## 2024-01-02 LAB — CBC
HCT: 25.2 % — ABNORMAL LOW (ref 36.0–46.0)
Hemoglobin: 8.1 g/dL — ABNORMAL LOW (ref 12.0–15.0)
MCH: 28.6 pg (ref 26.0–34.0)
MCHC: 32.1 g/dL (ref 30.0–36.0)
MCV: 89 fL (ref 80.0–100.0)
Platelets: 431 K/uL — ABNORMAL HIGH (ref 150–400)
RBC: 2.83 MIL/uL — ABNORMAL LOW (ref 3.87–5.11)
RDW: 20 % — ABNORMAL HIGH (ref 11.5–15.5)
WBC: 10.7 K/uL — ABNORMAL HIGH (ref 4.0–10.5)
nRBC: 0 % (ref 0.0–0.2)

## 2024-01-02 LAB — TROPONIN I (HIGH SENSITIVITY)
Troponin I (High Sensitivity): 31 ng/L — ABNORMAL HIGH (ref <18)
Troponin I (High Sensitivity): 33 ng/L — ABNORMAL HIGH (ref <18)

## 2024-01-02 LAB — MAGNESIUM: Magnesium: 2.1 mg/dL (ref 1.7–2.4)

## 2024-01-02 LAB — BRAIN NATRIURETIC PEPTIDE: B Natriuretic Peptide: 783 pg/mL — ABNORMAL HIGH (ref 0.0–100.0)

## 2024-01-02 MED ORDER — HEPARIN SODIUM (PORCINE) 5000 UNIT/ML IJ SOLN
5000.0000 [IU] | Freq: Three times a day (TID) | INTRAMUSCULAR | Status: DC
  Administered 2024-01-03 – 2024-01-04 (×3): 5000 [IU] via SUBCUTANEOUS
  Filled 2024-01-02 (×3): qty 1

## 2024-01-02 MED ORDER — ACETAMINOPHEN 650 MG RE SUPP: 650.0000 mg | Freq: Four times a day (QID) | RECTAL | Status: DC | PRN

## 2024-01-02 MED ORDER — AMLODIPINE BESYLATE 5 MG PO TABS
10.0000 mg | ORAL_TABLET | Freq: Every day | ORAL | Status: DC
  Administered 2024-01-03 – 2024-01-04 (×2): 10 mg via ORAL
  Filled 2024-01-02 (×2): qty 2

## 2024-01-02 MED ORDER — SENNA 8.6 MG PO TABS: 1.0000 | ORAL_TABLET | Freq: Every day | ORAL | Status: DC | PRN

## 2024-01-02 MED ORDER — PROCHLORPERAZINE EDISYLATE 10 MG/2ML IJ SOLN
5.0000 mg | Freq: Four times a day (QID) | INTRAMUSCULAR | Status: DC | PRN
  Administered 2024-01-03: 5 mg via INTRAVENOUS
  Filled 2024-01-02: qty 2

## 2024-01-02 MED ORDER — DIPHENHYDRAMINE HCL 25 MG PO CAPS: 25.0000 mg | ORAL_CAPSULE | Freq: Two times a day (BID) | ORAL | Status: DC | PRN

## 2024-01-02 MED ORDER — SODIUM CHLORIDE 0.9% FLUSH
3.0000 mL | Freq: Two times a day (BID) | INTRAVENOUS | Status: DC
  Administered 2024-01-03 – 2024-01-04 (×4): 3 mL via INTRAVENOUS

## 2024-01-02 MED ORDER — ACETAMINOPHEN 325 MG PO TABS: 650.0000 mg | ORAL_TABLET | Freq: Four times a day (QID) | ORAL | Status: DC | PRN

## 2024-01-02 NOTE — ED Triage Notes (Signed)
 Pt with shortness of breath, Pt has not  missed any dialysis but reports she had the same symptoms last week and they had to draw fluid off the back of her lungs.

## 2024-01-02 NOTE — H&P (Signed)
 History and Physical    Sheri Cooper FMW:[id number] DOB: Aug 04, 1950 DOA: 01/02/2024  PCP: Toribio Jerel MATSU, MD   Patient coming from: Home   Chief Complaint: SOB   HPI: Sheri Cooper is a 73 y.o. female with medical history significant for hypertension, COPD, anemia, and ESRD on hemodialysis who presents with progressive shortness of breath.  Patient underwent left thoracentesis on 12/14/2023 and now reports insidiously worsening dyspnea over the past 1 to 2 weeks.  She has a nonproductive cough associated with this but denies fevers or chest pain.  She denies any lower extremity swelling.  She denies missing any dialysis appointments.  ED Course: Upon arrival to the ED, patient is found to be afebrile and saturating in the 80s on room air with tachypnea, mild tachycardia, and stable blood pressure.  Labs are most notable for bicarbonate 21, BUN 25, WBC 10,700, hemoglobin 8.1, and negative COVID, influenza, and RSV PCR.  Nephrology (Dr. Marlee) was consulted by the ED physician.  Review of Systems:  All other systems reviewed and apart from HPI, are negative.  Past Medical History:  Diagnosis Date   Anemia    Chronic kidney disease (CKD), stage IV (severe) (HCC)    COPD (chronic obstructive pulmonary disease) (HCC)    ESRD (end stage renal disease) (HCC)    Hemodialysis T, TH, SAT, [address] [facility] [place]   GERD (gastroesophageal reflux disease)    History of blood transfusion    History of blood transfusion    Hypertension    Pneumonia    3 times in 2024-   PUD (peptic ulcer disease) 05/2022   Rheumatoid arthritis (HCC)    Rheumatoid arthritis (HCC)     Past Surgical History:  Procedure Laterality Date   A/V SHUNTOGRAM N/A 05/02/2023   Procedure: A/V SHUNTOGRAM;  Surgeon: Pearline Norman RAMAN, MD;  Location: [facility] INVASIVE CV LAB;  Service: Cardiovascular;  Laterality: N/A;   AV FISTULA PLACEMENT Left 11/29/2022   Procedure: INSERTION OF LEFT UPPER  ARTERIOVENOUS (AV)  GORE-TEX GRAFT ARM;  Surgeon: Lanis Fonda BRAVO, MD;  Location[facility] OR;  Service: Vascular;  Laterality: Left;   CENTRAL VENOUS CATHETER INSERTION Left 05/27/2022   Procedure: INSERTION CENTRAL LINE ADULT;  Surgeon: Kallie Manuelita BROCKS, MD;  Location: [facility] ORS;  Service: General;  Laterality: Left;   COLONOSCOPY WITH PROPOFOL  N/A 05/22/2022   Surgeon: Eartha Angelia Toribio, MD; blood in the terminal ileum and colon.   ESOPHAGOGASTRODUODENOSCOPY (EGD) WITH PROPOFOL  N/A 05/22/2022   Surgeon: Eartha Angelia, Toribio, MD; peptic ulcer disease with 2 nonbleeding cratered gastric ulcers, multiple duodenal ulcers, 2 of which had adherent clots that were removed and revealed 2 vessels.  1 vessel was clipped, cauterized, and treated with epinephrine  injection.  Second vessel was clipped and treated with cautery, clip fell off.   IR FLUORO GUIDE CV LINE RIGHT  05/24/2022   IR US  GUIDE VASC ACCESS RIGHT  05/24/2022   LAPAROTOMY N/A 05/27/2022   Procedure: EXPLORATORY LAPAROTOMY, OMENTAL PATH, DRAIN PLACEMENT;  Surgeon: Kallie Manuelita BROCKS, MD;  Location: [facility] ORS;  Service: General;  Laterality: N/A;   PERIPHERAL VASCULAR BALLOON ANGIOPLASTY  05/02/2023   Procedure: PERIPHERAL VASCULAR BALLOON ANGIOPLASTY;  Surgeon: Pearline Norman RAMAN, MD;  Location: [facility] INVASIVE CV LAB;  Service: Cardiovascular;;   PERIPHERAL VASCULAR THROMBECTOMY Left 08/18/2023   Procedure: PERIPHERAL VASCULAR THROMBECTOMY;  Surgeon: Melia Lynwood ORN, MD;  Location: [facility] INVASIVE CV LAB;  Service: Cardiovascular;  Laterality: Left;   VENOUS ANGIOPLASTY Left 08/18/2023  Procedure: VENOUS ANGIOPLASTY;  Surgeon: Melia Lynwood ORN, MD;  Location: [facility] INVASIVE CV LAB;  Service: Cardiovascular;  Laterality: Left;  80% Intragraft; 50% Venous Anastomosis   VOLVULUS REDUCTION     colon resection    Social History:   reports that she has quit smoking. Her smoking use included cigarettes. She has been exposed to tobacco smoke. She has never used smokeless tobacco.  She reports that she does not currently use alcohol. She reports that she does not use drugs.  No Known Allergies  History reviewed. No pertinent family history.   Prior to Admission medications   Medication Sig Start Date End Date Taking? Authorizing Provider  acetaminophen  (TYLENOL ) 325 MG tablet Take 2 tablets (650 mg total) by mouth every 6 (six) hours as needed for mild pain (pain score 1-3) or fever (or Fever >/= 101). 12/15/23   Pearlean Manus, MD  albuterol  (PROVENTIL ) (2.5 MG/3ML) 0.083% nebulizer solution Take 3 mLs (2.5 mg total) by nebulization every 6 (six) hours as needed for wheezing or shortness of breath. 12/15/23   Pearlean Manus, MD  albuterol  (VENTOLIN  HFA) 108 (90 Base) MCG/ACT inhaler Inhale 2 puffs into the lungs every 6 (six) hours as needed for wheezing or shortness of breath. 12/15/23   Pearlean Manus, MD  amLODipine  (NORVASC ) 10 MG tablet Take 1 tablet (10 mg total) by mouth daily. 12/15/23   Pearlean Manus, MD  ferrous sulfate  325 (65 FE) MG EC tablet Take 325 mg by mouth 3 (three) times a week. Tuesday, Thursday, and Saturday at dialysis    [provider]  hydrALAZINE  (APRESOLINE ) 50 MG tablet Take 1 tablet (50 mg total) by mouth 2 (two) times daily. 09/15/23   Evonnie Lenis, MD  losartan  (COZAAR ) 50 MG tablet Take 1 tablet (50 mg total) by mouth daily. Patient not taking: Reported on 12/14/2023 09/16/23   Evonnie Lenis, MD  metoprolol  succinate (TOPROL -XL) 50 MG 24 hr tablet Take 50 mg by mouth daily. 10/15/22   [provider]  sevelamer  carbonate (RENVELA ) 800 MG tablet Take 2,400 mg by mouth 3 (three) times daily with meals.    [provider]    Physical Exam: Vitals:   01/02/24 2207 01/02/24 2209 01/02/24 2210 01/02/24 2211  BP:      Pulse: (!) 110 (!) 111 (!) 107 (!) 101  Resp: (!) 21 (!) 26 17 18   Temp:      TempSrc:      SpO2: 97% 93% 94% 94%  Weight:        Constitutional: NAD, calm  Eyes: PERTLA, lids and conjunctivae  normal ENMT: Mucous membranes are moist. Posterior pharynx clear of any exudate or lesions.   Neck: supple, no masses  Respiratory: Diminished bilaterally. Dyspneic with speech.  Cardiovascular: S1 & S2 heard, regular rate and rhythm. No extremity edema.   Abdomen: No tenderness, soft. Bowel sounds active.  Musculoskeletal: no clubbing / cyanosis. No joint deformity upper and lower extremities.   Skin: no significant rashes, lesions, ulcers. Warm, dry, well-perfused. Neurologic: CN 2-12 grossly intact. Moving all extremities. Alert and oriented.  Psychiatric: Pleasant. Cooperative.    Labs and Imaging on Admission: I have personally reviewed following labs and imaging studies  CBC: Recent Labs  Lab 01/02/24 2053  WBC 10.7*  HGB 8.1*  HCT 25.2*  MCV 89.0  PLT 431*   Basic Metabolic Panel: Recent Labs  Lab 01/02/24 2053  NA 142  K 4.4  CL 107  CO2 21*  GLUCOSE 134*  BUN  25*  CREATININE 7.01*  CALCIUM  8.2*  MG 2.1   GFR: Estimated Creatinine Clearance: 4.5 mL/min (A) (by C-G formula based on SCr of 7.01 mg/dL (H)). Liver Function Tests: Recent Labs  Lab 01/02/24 2053  AST 15  ALT 8  ALKPHOS 89  BILITOT 0.6  PROT 6.5  ALBUMIN  3.0*   No results for input(s): LIPASE, AMYLASE in the last 168 hours. No results for input(s): AMMONIA in the last 168 hours. Coagulation Profile: No results for input(s): INR, PROTIME in the last 168 hours. Cardiac Enzymes: No results for input(s): CKTOTAL, CKMB, CKMBINDEX, TROPONINI in the last 168 hours. BNP (last 3 results) No results for input(s): PROBNP in the last 8760 hours. HbA1C: No results for input(s): HGBA1C in the last 72 hours. CBG: No results for input(s): GLUCAP in the last 168 hours. Lipid Profile: No results for input(s): CHOL, HDL, LDLCALC, TRIG, CHOLHDL, LDLDIRECT in the last 72 hours. Thyroid  Function Tests: No results for input(s): TSH, T4TOTAL, FREET4, T3FREE,  THYROIDAB in the last 72 hours. Anemia Panel: No results for input(s): VITAMINB12, FOLATE, FERRITIN, TIBC, IRON , RETICCTPCT in the last 72 hours. Urine analysis:    Component Value Date/Time   COLORURINE YELLOW 05/28/2022 1005   APPEARANCEUR CLOUDY (A) 05/28/2022 1005   LABSPEC 1.018 05/28/2022 1005   PHURINE 7.0 05/28/2022 1005   GLUCOSEU NEGATIVE 05/28/2022 1005   HGBUR SMALL (A) 05/28/2022 1005   BILIRUBINUR NEGATIVE 05/28/2022 1005   KETONESUR NEGATIVE 05/28/2022 1005   PROTEINUR >=300 (A) 05/28/2022 1005   NITRITE NEGATIVE 05/28/2022 1005   LEUKOCYTESUR MODERATE (A) 05/28/2022 1005   Sepsis Labs: @LABRCNTIP (procalcitonin:4,lacticidven:4) ) Recent Results (from the past 240 hours)  Resp panel by RT-PCR (RSV, Flu A&B, Covid) Anterior Nasal Swab     Status: None   Collection Time: 01/02/24  8:56 PM   Specimen: Anterior Nasal Swab  Result Value Ref Range Status   SARS Coronavirus 2 by RT PCR NEGATIVE NEGATIVE Final    Comment: (NOTE) SARS-CoV-2 target nucleic acids are NOT DETECTED.  The SARS-CoV-2 RNA is generally detectable in upper respiratory specimens during the acute phase of infection. The lowest concentration of SARS-CoV-2 viral copies this assay can detect is 138 copies/mL. A negative result does not preclude SARS-Cov-2 infection and should not be used as the sole basis for treatment or other patient management decisions. A negative result may occur with  improper specimen collection/handling, submission of specimen other than nasopharyngeal swab, presence of viral mutation(s) within the areas targeted by this assay, and inadequate number of viral copies(<138 copies/mL). A negative result must be combined with clinical observations, patient history, and epidemiological information. The expected result is Negative.  Fact Sheet for Patients:  BloggerCourse.com  Fact Sheet for Healthcare Providers:   SeriousBroker.it  This test is no t yet approved or cleared by the [place]  FDA and  has been authorized for detection and/or diagnosis of SARS-CoV-2 by FDA under an Emergency Use Authorization (EUA). This EUA will remain  in effect (meaning this test can be used) for the duration of the COVID-19 declaration under Section 564(b)(1) of the Act, 21 U.S.C.section 360bbb-3(b)(1), unless the authorization is terminated  or revoked sooner.       Influenza A by PCR NEGATIVE NEGATIVE Final   Influenza B by PCR NEGATIVE NEGATIVE Final    Comment: (NOTE) The Xpert Xpress SARS-CoV-2/FLU/RSV plus assay is intended as an aid in the diagnosis of influenza from Nasopharyngeal swab specimens and should not be used as a sole basis  for treatment. Nasal washings and aspirates are unacceptable for Xpert Xpress SARS-CoV-2/FLU/RSV testing.  Fact Sheet for Patients: BloggerCourse.com  Fact Sheet for Healthcare Providers: SeriousBroker.it  This test is not yet approved or cleared by the [place]  FDA and has been authorized for detection and/or diagnosis of SARS-CoV-2 by FDA under an Emergency Use Authorization (EUA). This EUA will remain in effect (meaning this test can be used) for the duration of the COVID-19 declaration under Section 564(b)(1) of the Act, 21 U.S.C. section 360bbb-3(b)(1), unless the authorization is terminated or revoked.     Resp Syncytial Virus by PCR NEGATIVE NEGATIVE Final    Comment: (NOTE) Fact Sheet for Patients: BloggerCourse.com  Fact Sheet for Healthcare Providers: SeriousBroker.it  This test is not yet approved or cleared by the [place]  FDA and has been authorized for detection and/or diagnosis of SARS-CoV-2 by FDA under an Emergency Use Authorization (EUA). This EUA will remain in effect (meaning this test can be used) for  the duration of the COVID-19 declaration under Section 564(b)(1) of the Act, 21 U.S.C. section 360bbb-3(b)(1), unless the authorization is terminated or revoked.  Performed at [facility], [address], [place], [place] [address]      Radiological Exams on Admission: CT Chest Wo Contrast Result Date: 01/02/2024 CLINICAL DATA:  Shortness of breath EXAM: CT CHEST WITHOUT CONTRAST TECHNIQUE: Multidetector CT imaging of the chest was performed following the standard protocol without IV contrast. RADIATION DOSE REDUCTION: This exam was performed according to the departmental dose-optimization program which includes automated exposure control, adjustment of the mA and/or kV according to patient size and/or use of iterative reconstruction technique. COMPARISON:  Chest x-ray from earlier in the same day. FINDINGS: Cardiovascular: Atherosclerotic calcifications of the aorta are noted. No aneurysmal dilatation is seen. Heart is mildly enlarged. Mild coronary calcifications are noted. No enlargement of the pulmonary artery is seen. Mediastinum/Nodes: Thoracic inlet is within normal limits. No hilar or mediastinal adenopathy is noted. The esophagus is within normal limits as visualized. Lungs/Pleura: There is a right-sided pleural effusion with lower lobe consolidation present. The size of the effusion has improved from a prior CT from May of 2024 although stable appearing when compared with the recent plain film. Similar findings are noted on the left with an effusion which is slightly larger than that on the right and relatively stable from the prior exam. The degree lower lobe consolidation however has increased. No parenchymal nodules are noted. Upper Abdomen: Visualized upper abdomen is unremarkable. Musculoskeletal: No chest wall mass or suspicious bone lesions identified. IMPRESSION: Bilateral pleural effusions with associated lower lobe consolidation left greater than right. No other focal abnormality is  noted. Aortic Atherosclerosis (ICD10-I70.0). Electronically Signed   By: Oneil Devonshire M.D.   On: 01/02/2024 22:53   DG Chest Port 1 View Result Date: 01/02/2024 CLINICAL DATA:  Shortness of breath EXAM: PORTABLE CHEST 1 VIEW COMPARISON:  Chest x-ray 12/14/2023 FINDINGS: The heart is enlarged. There are small bilateral pleural effusions similar to the prior study. There some increasing patchy opacities in the left lung base and left mid lung. There is no pneumothorax. There are atherosclerotic calcifications of the aorta. No acute fractures are seen. IMPRESSION: 1. Increasing patchy opacities in the left lung base and left mid lung, which may represent atelectasis or pneumonia. 2. Small bilateral pleural effusions. Electronically Signed   By: Greig Pique M.D.   On: 01/02/2024 21:49     Assessment/Plan   1. Pleural effusion; acute hypoxic respiratory failure  -  No infectious symptoms; left pleural fluid was transudate last month  - Consult IR for thoracentesis, restrict fluids, continue supplemental O2 as-needed  2. ESRD  - Nephrology consulted by ED physician  - Restrict fluids, renally-dose medications    3. Anemia  - No overt bleeding  - Monitor    4. COPD  - Not in exacerbation  - Continue short-acting bronchodilators as-needed   5. Hypertension  - Amlodipine     DVT prophylaxis: sq heparin   Code Status: Full  Level of Care: Level of care: Telemetry Family Communication: None present  Disposition Plan:  Patient is from: Home  Anticipated d/c is to: home  Anticipated d/c date is: 01/04/24  Patient currently: Pending thoracentesis, improved respiratory status  Consults called: Nephrology  Admission status: Inpatient     Evalene GORMAN Sprinkles, MD [facility]  01/02/2024, 11:54 PM

## 2024-01-02 NOTE — ED Provider Notes (Signed)
 [place] EMERGENCY DEPARTMENT AT [facility] Provider Note   CSN: [id number] Arrival date & time: 01/02/24  2032     Patient presents with: No chief complaint on file.   Sheri Cooper is a 73 y.o. female.  She has a history of COPD, pleural effusion, end-stage renal disease on dialysis Tuesday Thursday Saturday.  She has had frequent admissions for shortness of breath.  Complaining of increased shortness of breath that started this morning.  Nonproductive cough.  Tried a breathing treatment without improvement.  She feels like fluid may have reaccumulated on her lungs again.  No fevers or chills.  No nausea or vomiting.  She did have 1 episode of loose stools.  No sick contacts or recent travel.  Former smoker.  {Add pertinent medical, surgical, social history, OB history to YEP:67052} The history is provided by the patient.  Shortness of Breath Severity:  Moderate Onset quality:  Gradual Duration:  1 day Timing:  Constant Progression:  Worsening Chronicity:  Recurrent Relieved by:  Nothing Worsened by:  Activity and coughing Ineffective treatments: neb. Associated symptoms: cough and wheezing   Associated symptoms: no abdominal pain, no chest pain, no fever, no hemoptysis and no sputum production   Risk factors: no tobacco use        Prior to Admission medications   Medication Sig Start Date End Date Taking? Authorizing Provider  acetaminophen  (TYLENOL ) 325 MG tablet Take 2 tablets (650 mg total) by mouth every 6 (six) hours as needed for mild pain (pain score 1-3) or fever (or Fever >/= 101). 12/15/23   Pearlean Manus, MD  albuterol  (PROVENTIL ) (2.5 MG/3ML) 0.083% nebulizer solution Take 3 mLs (2.5 mg total) by nebulization every 6 (six) hours as needed for wheezing or shortness of breath. 12/15/23   Pearlean Manus, MD  albuterol  (VENTOLIN  HFA) 108 (90 Base) MCG/ACT inhaler Inhale 2 puffs into the lungs every 6 (six) hours as needed for wheezing or shortness  of breath. 12/15/23   Pearlean Manus, MD  amLODipine  (NORVASC ) 10 MG tablet Take 1 tablet (10 mg total) by mouth daily. 12/15/23   Pearlean Manus, MD  ferrous sulfate  325 (65 FE) MG EC tablet Take 325 mg by mouth 3 (three) times a week. Tuesday, Thursday, and Saturday at dialysis    [provider]  hydrALAZINE  (APRESOLINE ) 50 MG tablet Take 1 tablet (50 mg total) by mouth 2 (two) times daily. 09/15/23   Evonnie Lenis, MD  losartan  (COZAAR ) 50 MG tablet Take 1 tablet (50 mg total) by mouth daily. Patient not taking: Reported on 12/14/2023 09/16/23   Evonnie Lenis, MD  metoprolol  succinate (TOPROL -XL) 50 MG 24 hr tablet Take 50 mg by mouth daily. 10/15/22   [provider]  sevelamer  carbonate (RENVELA ) 800 MG tablet Take 2,400 mg by mouth 3 (three) times daily with meals.    [provider]    Allergies: Patient has no allergy information on record.    Review of Systems  Constitutional:  Negative for fever.  Respiratory:  Positive for cough, shortness of breath and wheezing. Negative for hemoptysis and sputum production.   Cardiovascular:  Negative for chest pain.  Gastrointestinal:  Negative for abdominal pain.    Updated Vital Signs BP (!) 197/59   Pulse (!) 103   Temp 98.1 F (36.7 C) (Oral)   SpO2 96%   Physical Exam Vitals and nursing note reviewed.  Constitutional:      General: She is not in acute distress.  Appearance: Normal appearance. She is well-developed.  HENT:     Head: Normocephalic and atraumatic.  Eyes:     Conjunctiva/sclera: Conjunctivae normal.  Cardiovascular:     Rate and Rhythm: Regular rhythm. Tachycardia present.     Heart sounds: No murmur heard. Pulmonary:     Effort: Pulmonary effort is normal. No respiratory distress.     Breath sounds: No stridor. Examination of the left-middle field reveals rhonchi. Examination of the right-lower field reveals rhonchi. Examination of the left-lower field reveals rhonchi. Rhonchi present. No  wheezing.  Abdominal:     Palpations: Abdomen is soft.     Tenderness: There is no abdominal tenderness. There is no guarding or rebound.  Musculoskeletal:     Cervical back: Neck supple.     Right lower leg: No edema.     Left lower leg: No edema.     Comments: Fistula left upper arm with positive thrill  Skin:    General: Skin is warm and dry.  Neurological:     General: No focal deficit present.     Mental Status: She is alert.     GCS: GCS eye subscore is 4. GCS verbal subscore is 5. GCS motor subscore is 6.     Motor: No weakness.     (all labs ordered are listed, but only abnormal results are displayed) Labs Reviewed  RESP PANEL BY RT-PCR (RSV, FLU A&B, COVID)  [id number]  COMPREHENSIVE METABOLIC PANEL WITH GFR  CBC  BRAIN NATRIURETIC PEPTIDE  BLOOD GAS, VENOUS  MAGNESIUM   TROPONIN I (HIGH SENSITIVITY)    EKG: None  Radiology: No results found.  {Document cardiac monitor, telemetry assessment procedure when appropriate:32947} Procedures   Medications Ordered in the ED - No data to display  Clinical Course as of 01/02/24 2307  [facility] Jan 02, 2024  2047 EKG not crossing in epic.  Sinus tachycardia prolonged QT no acute ST-T changes.  [MB]  2123 Chest x-ray interpreted by me as continued right and left pleural effusions.  Awaiting radiology reading. [MB]  2128 8/13 she had 500 cc taken off by thoracentesis of her left side. [MB]  2242 CT shows moderate left and small right pleural effusions.  Awaiting radiology reading. [MB]  2246 I did secure message Dr. Marlee who is on for nephrology and let him know that she will probably need dialysis tomorrow. [MB]    Clinical Course User Index [MB] Towana Ozell BROCKS, MD   {Click here for ABCD2, HEART and other calculators REFRESH Note before signing:1}                              Medical Decision Making Amount and/or Complexity of Data Reviewed Labs: ordered. Radiology: ordered.   This patient complains of ***; this  involves an extensive number of treatment Options and is a complaint that carries with it a high risk of complications and morbidity. The differential includes ***  I ordered, reviewed and interpreted labs, which included *** I ordered medication *** and reviewed PMP when indicated. I ordered imaging studies which included *** and I independently    visualized and interpreted imaging which showed *** Additional history obtained from *** Previous records obtained and reviewed *** I consulted *** and discussed lab and imaging findings and discussed disposition.  Cardiac monitoring reviewed, *** Social determinants considered, *** Critical Interventions: ***  After the interventions stated above, I reevaluated the patient and found *** Admission and further  testing considered, ***   {Document critical care time when appropriate  Document review of labs and clinical decision tools ie CHADS2VASC2, etc  Document your independent review of radiology images and any outside records  Document your discussion with family members, caretakers and with consultants  Document social determinants of health affecting pt's care  Document your decision making why or why not admission, treatments were needed:32947:::1}   Final diagnoses:  None    ED Discharge Orders     None

## 2024-01-03 ENCOUNTER — Encounter (HOSPITAL_COMMUNITY): Payer: Self-pay | Admitting: Family Medicine

## 2024-01-03 DIAGNOSIS — J9601 Acute respiratory failure with hypoxia: Secondary | ICD-10-CM | POA: Diagnosis not present

## 2024-01-03 LAB — BASIC METABOLIC PANEL WITH GFR
Anion gap: 10 (ref 5–15)
BUN: 26 mg/dL — ABNORMAL HIGH (ref 8–23)
CO2: 22 mmol/L (ref 22–32)
Calcium: 8.2 mg/dL — ABNORMAL LOW (ref 8.9–10.3)
Chloride: 110 mmol/L (ref 98–111)
Creatinine, Ser: 7.6 mg/dL — ABNORMAL HIGH (ref 0.44–1.00)
GFR, Estimated: 5 mL/min — ABNORMAL LOW (ref >60)
Glucose, Bld: 80 mg/dL (ref 70–99)
Potassium: 4.7 mmol/L (ref 3.5–5.1)
Sodium: 142 mmol/L (ref 135–145)

## 2024-01-03 LAB — CBC
HCT: 22.3 % — ABNORMAL LOW (ref 36.0–46.0)
Hemoglobin: 7.1 g/dL — ABNORMAL LOW (ref 12.0–15.0)
MCH: 28.4 pg (ref 26.0–34.0)
MCHC: 31.8 g/dL (ref 30.0–36.0)
MCV: 89.2 fL (ref 80.0–100.0)
Platelets: 393 K/uL (ref 150–400)
RBC: 2.5 MIL/uL — ABNORMAL LOW (ref 3.87–5.11)
RDW: 20.1 % — ABNORMAL HIGH (ref 11.5–15.5)
WBC: 9.4 K/uL (ref 4.0–10.5)
nRBC: 0 % (ref 0.0–0.2)

## 2024-01-03 LAB — HEPATIC FUNCTION PANEL
ALT: 6 U/L (ref 0–44)
AST: 13 U/L — ABNORMAL LOW (ref 15–41)
Albumin: 2.6 g/dL — ABNORMAL LOW (ref 3.5–5.0)
Alkaline Phosphatase: 71 U/L (ref 38–126)
Bilirubin, Direct: <0.1 mg/dL (ref 0.0–0.2)
Total Bilirubin: 0.7 mg/dL (ref 0.0–1.2)
Total Protein: 5.8 g/dL — ABNORMAL LOW (ref 6.5–8.1)

## 2024-01-03 LAB — LACTATE DEHYDROGENASE: LDH: 127 U/L (ref 98–192)

## 2024-01-03 LAB — MRSA NEXT GEN BY PCR, NASAL: MRSA by PCR Next Gen: NOT DETECTED

## 2024-01-03 MED ORDER — CHLORHEXIDINE GLUCONATE CLOTH 2 % EX PADS
6.0000 | MEDICATED_PAD | Freq: Every day | CUTANEOUS | Status: DC
  Administered 2024-01-03 – 2024-01-04 (×2): 6 via TOPICAL

## 2024-01-03 MED ORDER — HEPARIN SODIUM (PORCINE) 1000 UNIT/ML DIALYSIS: 40.0000 [IU]/kg | INTRAMUSCULAR | Status: DC | PRN

## 2024-01-03 MED ORDER — PENTAFLUOROPROP-TETRAFLUOROETH EX AERO
1.0000 | INHALATION_SPRAY | CUTANEOUS | Status: DC | PRN
  Administered 2024-01-03: 1 via TOPICAL
  Filled 2024-01-03: qty 30

## 2024-01-03 MED ORDER — SEVELAMER CARBONATE 800 MG PO TABS
2400.0000 mg | ORAL_TABLET | Freq: Three times a day (TID) | ORAL | Status: DC
  Administered 2024-01-03 – 2024-01-04 (×4): 2400 mg via ORAL
  Filled 2024-01-03 (×4): qty 3

## 2024-01-03 NOTE — Progress Notes (Signed)
 Pt receives out-pt HD at [facility], TTS, 1100am chair time.   Contacted clinic and informed of pt arrival at this time. Faxed H&P at this time, as requested by clinic. Will continue to assist as needed.   [facility] Kiet Geer Dialysis navigator [phone] [name] #[phone]

## 2024-01-03 NOTE — Plan of Care (Signed)

## 2024-01-03 NOTE — ED Notes (Signed)
 Patient is resting comfortably.

## 2024-01-03 NOTE — ED Notes (Signed)
 Report given to [place].

## 2024-01-03 NOTE — TOC Initial Note (Signed)
 Transition of Care [facility]) - Initial/Assessment Note    Patient Details  Name: Sheri Cooper MRN: [id number] Date of Birth: 04-Aug-1950  [facility] CM/SW Contact:    Lucie Lunger, LCSWA Phone Number: 01/03/2024, 8:47 AM  Clinical Narrative:                 Pt is high risk for readmission. Pt is known to [facility] from past hospital admissions. Pt is from home alone and is mostly independent in completing her ADLs. Pt has a cane, walker and wheelchair in the home to use when needed. Pt's dialysis is TTS at [facility]. She uses [facility] for transportation to appointments. TOC to follow.   Expected Discharge Plan: Home/Self Care Barriers to Discharge: Continued Medical Work up   Patient Goals and CMS Choice Patient states their goals for this hospitalization and ongoing recovery are:: return home CMS Medicare.gov Compare Post Acute Care list provided to:: Patient Choice offered to / list presented to : Patient      Expected Discharge Plan and Services In-house Referral: Clinical Social Work Discharge Planning Services: CM Consult   Living arrangements for the past 2 months: Apartment Expected Discharge Date: 01/06/24                                    Prior Living Arrangements/Services Living arrangements for the past 2 months: Apartment Lives with:: Self Patient language and need for interpreter reviewed:: Yes Do you feel safe going back to the place where you live?: Yes      Need for Family Participation in Patient Care: Yes (Comment) Care giver support system in place?: Yes (comment) Current home services: DME (cane, walker, wheelchair, neb machine) Criminal Activity/Legal Involvement Pertinent to Current Situation/Hospitalization: No - Comment as needed  Activities of Daily Living   ADL Screening (condition at time of admission) Independently performs ADLs?: Yes (appropriate for developmental age) Does the patient have a NEW difficulty with  bathing/dressing/toileting/self-feeding that is expected to last >3 days?: No Does the patient have a NEW difficulty with getting in/out of bed, walking, or climbing stairs that is expected to last >3 days?: No Does the patient have a NEW difficulty with communication that is expected to last >3 days?: No Is the patient deaf or have difficulty hearing?: No Does the patient have difficulty seeing, even when wearing glasses/contacts?: No Does the patient have difficulty concentrating, remembering, or making decisions?: No  Permission Sought/Granted                  Emotional Assessment Appearance:: Appears stated age Attitude/Demeanor/Rapport: Engaged Affect (typically observed): Accepting Orientation: : Oriented to Self, Oriented to Place, Oriented to  Time, Oriented to Situation Alcohol / Substance Use: Not Applicable Psych Involvement: No (comment)  Admission diagnosis:  Acute respiratory failure with hypoxia (HCC) [J96.01] Patient Active Problem List   Diagnosis Date Noted   Recurrent pleural effusion 01/02/2024   Acute on chronic heart failure with preserved ejection fraction (HCC) 12/13/2023   Volume overload 12/13/2023   Essential hypertension 12/03/2023   COPD (chronic obstructive pulmonary disease) (HCC) 12/03/2023   Peptic ulcer disease 12/03/2023   Hyperkalemia 09/27/2023   ESRD on hemodialysis (HCC) 09/26/2023   Acute on chronic heart failure with preserved ejection fraction (HFpEF) (HCC) 09/14/2023   Pneumonia 09/08/2023   Fluid overload 01/25/2023   COPD with acute exacerbation (HCC) 01/25/2023   Primary localized osteoarthrosis  of multiple sites 09/14/2022   Weight loss 09/14/2022   Acute respiratory failure with hypoxia (HCC) 09/07/2022   Lobar pneumonia (HCC) 09/07/2022   ESRD (end stage renal disease) (HCC) 09/07/2022   Sepsis due to undetermined organism (HCC) 05/30/2022   Pneumoperitoneum 05/28/2022   Duodenal perforation (HCC) 05/28/2022   Peritonitis  (HCC) 05/28/2022   Acute upper GI bleed 05/24/2022   Multiple duodenal ulcers 05/24/2022   ABLA (acute blood loss anemia) 05/24/2022   Melena 05/22/2022   HCAP (healthcare-associated pneumonia) 05/16/2022   Hypothermia 05/16/2022   Acute renal failure superimposed on stage 3b chronic kidney disease (HCC) 05/16/2022   Elevated brain natriuretic peptide (BNP) level 05/16/2022   Elevated troponin 05/16/2022   Increased anion gap metabolic acidosis 05/16/2022   Prolonged QT interval 05/16/2022   Hypoalbuminemia due to protein-calorie malnutrition (HCC) 05/16/2022   Iron  deficiency anemia 05/16/2022   CKD (chronic kidney disease), stage V (HCC) 05/16/2022   Renal mass 05/14/2022   Simple renal cyst 05/14/2022   Vitamin D deficiency 05/14/2022   Chronic obstructive pulmonary disease with (acute) exacerbation (HCC) 05/07/2022   Pneumonia due to infectious organism 05/07/2022   Acute nontraumatic kidney injury (HCC) 05/07/2022   Mixed hyperlipidemia 05/07/2022   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   Primary hypertension 05/07/2022   Rheumatoid arthritis involving both hands with negative rheumatoid factor (HCC) 05/07/2022   Medication therapy management recommendation declined by patient 07/28/2020   Encounter for monitoring aromatase inhibitor therapy 06/12/2020   History of radiation therapy 06/12/2020   Involvement of right lateral surgical margin by neoplasm 04/16/2020   Status post breast lumpectomy 04/16/2020   Coordination of complex care 01/23/2020   Osteopenia of multiple sites 01/23/2020   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 - [place], [place] - [address] [place] [address] Phone: [phone] Fax: [phone]     Social Drivers of Health (SDOH) Social History: SDOH Screenings   Food Insecurity: No Food Insecurity (01/03/2024)  Housing: Low Risk  (01/03/2024)  Transportation Needs: No Transportation Needs (01/03/2024)  Utilities: Not At Risk (01/03/2024)  Alcohol Screen: Low Risk  (03/07/2023)  Depression (PHQ2-9): Low Risk  (12/09/2023)  Financial Resource Strain: Low Risk  (03/07/2023)  Physical Activity: Inactive (03/07/2023)  Social Connections: Unknown (01/03/2024)  Stress: No Stress Concern Present (03/07/2023)  Tobacco Use: Medium Risk (01/03/2024)  Health Literacy: Adequate Health Literacy (03/07/2023)   SDOH Interventions:     Readmission Risk Interventions    01/03/2024    8:46 AM 12/13/2023   11:38 AM 12/08/2023    9:55 AM  Readmission Risk Prevention Plan  Transportation Screening Complete Complete Complete  Medication Review (RN Care Manager) Complete Complete Complete  HRI or Home Care Consult Complete Complete Complete  SW Recovery Care/Counseling Consult Complete Complete Complete  Palliative Care Screening Not Applicable Not Applicable Not Applicable  Skilled Nursing Facility Not Applicable Not Applicable Not Applicable

## 2024-01-03 NOTE — Consult Note (Signed)
 Reason for Consult: Acute hypoxic respiratory failure, continuity of ESRD care Referring Physician: Carmon Fairly, DO [facility])  HPI:  73 year old woman with past medical history significant for hypertension, COPD, rheumatoid arthritis and end-stage renal disease on hemodialysis on a TTS schedule [facility]).  Presents with worsening shortness of breath over the last 2 days following completion of her entire hemodialysis treatment on Saturday 8/30.  She reports there have been some fluctuations in her EDW in the recent past owing to hospitalization and thoracentesis for pleural effusions.  She was just discharged from the hospital 2 weeks ago on 8/14 after admission for volume overload treated with hemodialysis/UF and thoracentesis.  She reports a recently exacerbated nonproductive cough without any chest pain, fever or chills.  She denies any hemoptysis or pedal edema.  She denies any nausea, vomiting or diarrhea.  Chest x-ray done yesterday showed increasing patchy opacities in the left lung base/left midlung with small bilateral pleural effusions.  Past Medical History:  Diagnosis Date   Anemia    Chronic kidney disease (CKD), stage IV (severe) (HCC)    COPD (chronic obstructive pulmonary disease) (HCC)    ESRD (end stage renal disease) (HCC)    Hemodialysis T, TH, SAT, [address] , [place]   GERD (gastroesophageal reflux disease)    History of blood transfusion    History of blood transfusion    Hypertension    Pneumonia    3 times in 2024-   PUD (peptic ulcer disease) 05/2022   Rheumatoid arthritis (HCC)    Rheumatoid arthritis (HCC)     Past Surgical History:  Procedure Laterality Date   A/V SHUNTOGRAM N/A 05/02/2023   Procedure: A/V SHUNTOGRAM;  Surgeon: Pearline Norman RAMAN, MD;  Location: [facility] INVASIVE CV LAB;  Service: Cardiovascular;  Laterality: N/A;   AV FISTULA PLACEMENT Left 11/29/2022   Procedure: INSERTION OF LEFT UPPER  ARTERIOVENOUS (AV) GORE-TEX GRAFT ARM;  Surgeon: Lanis Fonda BRAVO, MD;  Location: [facility] OR;  Service: Vascular;  Laterality: Left;   CENTRAL VENOUS CATHETER INSERTION Left 05/27/2022   Procedure: INSERTION CENTRAL LINE ADULT;  Surgeon: Kallie Manuelita BROCKS, MD;  Location: [facility] ORS;  Service: General;  Laterality: Left;   COLONOSCOPY WITH PROPOFOL  N/A 05/22/2022   Surgeon: Eartha Angelia Sieving, MD; blood in the terminal ileum and colon.   ESOPHAGOGASTRODUODENOSCOPY (EGD) WITH PROPOFOL  N/A 05/22/2022   Surgeon: Eartha Angelia, Sieving, MD; peptic ulcer disease with 2 nonbleeding cratered gastric ulcers, multiple duodenal ulcers, 2 of which had adherent clots that were removed and revealed 2 vessels.  1 vessel was clipped, cauterized, and treated with epinephrine  injection.  Second vessel was clipped and treated with cautery, clip fell off.   IR FLUORO GUIDE CV LINE RIGHT  05/24/2022   IR US  GUIDE VASC ACCESS RIGHT  05/24/2022   LAPAROTOMY N/A 05/27/2022   Procedure: EXPLORATORY LAPAROTOMY, OMENTAL PATH, DRAIN PLACEMENT;  Surgeon: Kallie Manuelita BROCKS, MD;  Location: [facility] ORS;  Service: General;  Laterality: N/A;   PERIPHERAL VASCULAR BALLOON ANGIOPLASTY  05/02/2023   Procedure: PERIPHERAL VASCULAR BALLOON ANGIOPLASTY;  Surgeon: Pearline Norman RAMAN, MD;  Location: [facility] INVASIVE CV LAB;  Service: Cardiovascular;;   PERIPHERAL VASCULAR THROMBECTOMY Left 08/18/2023   Procedure: PERIPHERAL VASCULAR THROMBECTOMY;  Surgeon: Melia Lynwood ORN, MD;  Location: [facility] INVASIVE CV LAB;  Service: Cardiovascular;  Laterality: Left;   VENOUS ANGIOPLASTY Left 08/18/2023   Procedure: VENOUS ANGIOPLASTY;  Surgeon: Melia Lynwood ORN, MD;  Location: [facility] INVASIVE CV LAB;  Service: Cardiovascular;  Laterality: Left;  80%  Intragraft; 50% Venous Anastomosis   VOLVULUS REDUCTION     colon resection    History reviewed. No pertinent family history.  Social History:  reports that she has quit smoking. Her smoking use included cigarettes. She has been exposed to tobacco smoke. She has never used  smokeless tobacco. She reports that she does not currently use alcohol. She reports that she does not use drugs.  Allergies: No Known Allergies  Medications: I have reviewed the patient's current medications. Scheduled:  amLODipine   10 mg Oral Daily   Chlorhexidine  Gluconate Cloth  6 each Topical Daily   heparin   5,000 Units Subcutaneous Q8H   sodium chloride  flush  3 mL Intravenous Q12H      Latest Ref Rng & Units 01/03/2024    4:40 AM 01/02/2024    8:53 PM 12/15/2023    4:40 AM  BMP  Glucose 70 - 99 mg/dL 80  865  82   BUN 8 - 23 mg/dL 26  25  76   Creatinine 0.44 - 1.00 mg/dL 2.39  2.98  3.57   Sodium 135 - 145 mmol/L 142  142  136   Potassium 3.5 - 5.1 mmol/L 4.7  4.4  4.2   Chloride 98 - 111 mmol/L 110  107  92   CO2 22 - 32 mmol/L 22  21  26    Calcium  8.9 - 10.3 mg/dL 8.2  8.2  7.6       Latest Ref Rng & Units 01/03/2024    4:40 AM 01/02/2024    8:53 PM 12/15/2023    4:40 AM  CBC  WBC 4.0 - 10.5 K/uL 9.4  10.7  15.8   Hemoglobin 12.0 - 15.0 g/dL 7.1  8.1  9.0   Hematocrit 36.0 - 46.0 % 22.3  25.2  27.4   Platelets 150 - 400 K/uL 393  431  341    CT Chest Wo Contrast Result Date: 01/02/2024 CLINICAL DATA:  Shortness of breath EXAM: CT CHEST WITHOUT CONTRAST TECHNIQUE: Multidetector CT imaging of the chest was performed following the standard protocol without IV contrast. RADIATION DOSE REDUCTION: This exam was performed according to the departmental dose-optimization program which includes automated exposure control, adjustment of the mA and/or kV according to patient size and/or use of iterative reconstruction technique. COMPARISON:  Chest x-ray from earlier in the same day. FINDINGS: Cardiovascular: Atherosclerotic calcifications of the aorta are noted. No aneurysmal dilatation is seen. Heart is mildly enlarged. Mild coronary calcifications are noted. No enlargement of the pulmonary artery is seen. Mediastinum/Nodes: Thoracic inlet is within normal limits. No hilar or mediastinal  adenopathy is noted. The esophagus is within normal limits as visualized. Lungs/Pleura: There is a right-sided pleural effusion with lower lobe consolidation present. The size of the effusion has improved from a prior CT from May of 2024 although stable appearing when compared with the recent plain film. Similar findings are noted on the left with an effusion which is slightly larger than that on the right and relatively stable from the prior exam. The degree lower lobe consolidation however has increased. No parenchymal nodules are noted. Upper Abdomen: Visualized upper abdomen is unremarkable. Musculoskeletal: No chest wall mass or suspicious bone lesions identified. IMPRESSION: Bilateral pleural effusions with associated lower lobe consolidation left greater than right. No other focal abnormality is noted. Aortic Atherosclerosis (ICD10-I70.0). Electronically Signed   By: Oneil Devonshire M.D.   On: 01/02/2024 22:53   DG Chest Port 1 View Result Date: 01/02/2024 CLINICAL DATA:  Shortness  of breath EXAM: PORTABLE CHEST 1 VIEW COMPARISON:  Chest x-ray 12/14/2023 FINDINGS: The heart is enlarged. There are small bilateral pleural effusions similar to the prior study. There some increasing patchy opacities in the left lung base and left mid lung. There is no pneumothorax. There are atherosclerotic calcifications of the aorta. No acute fractures are seen. IMPRESSION: 1. Increasing patchy opacities in the left lung base and left mid lung, which may represent atelectasis or pneumonia. 2. Small bilateral pleural effusions. Electronically Signed   By: Greig Pique M.D.   On: 01/02/2024 21:49    Review of Systems  Constitutional:  Positive for fatigue. Negative for chills and fever.  HENT:  Negative for nosebleeds, sore throat and trouble swallowing.   Eyes:  Negative for redness and visual disturbance.  Respiratory:  Positive for cough and shortness of breath. Negative for chest tightness and wheezing.    Cardiovascular:  Negative for chest pain and leg swelling.  Gastrointestinal:  Negative for blood in stool, diarrhea, nausea and vomiting.  Genitourinary:  Negative for dysuria and hematuria.  Musculoskeletal:  Negative for back pain, joint swelling and myalgias.  Skin:  Negative for rash and wound.  Neurological:  Negative for weakness and light-headedness.   Blood pressure (!) 175/72, pulse 89, temperature 98 F (36.7 C), temperature source Oral, resp. rate (!) 21, height 4' 11 (1.499 m), weight 41.2 kg, SpO2 94%. Physical Exam Vitals and nursing note reviewed.  Constitutional:      Appearance: She is well-developed and normal weight. She is ill-appearing.     Comments: Appears comfortable laying flat in bed (oxygen saturation 97%)  HENT:     Head: Normocephalic and atraumatic.     Mouth/Throat:     Pharynx: Oropharynx is clear.  Eyes:     Extraocular Movements: Extraocular movements intact.  Neck:     Vascular: No JVD.  Cardiovascular:     Rate and Rhythm: Normal rate and regular rhythm.  Pulmonary:     Effort: Tachypnea present.     Breath sounds: Examination of the left-middle field reveals rales. Examination of the right-lower field reveals decreased breath sounds. Examination of the left-lower field reveals decreased breath sounds and rales. Decreased breath sounds and rales present.  Abdominal:     General: Bowel sounds are normal.     Palpations: Abdomen is soft.  Musculoskeletal:     Cervical back: Neck supple.     Right lower leg: No edema.     Left lower leg: No edema.     Comments: Left upper arm AV graft with palpable thrill   Skin:    General: Skin is warm and dry.  Neurological:     Mental Status: She is alert and oriented to person, place, and time.     Assessment/Plan: 1.  Acute hypoxic respiratory failure: Imaging shows small/trivial bilateral pleural effusions which is less likely to be the etiology for her shortness of breath.  She does have a new  infiltrate/patchy opacity in the left lung that may be indicative of edema versus pneumonia.  She does not have any indications of infection (no leukocytosis or fever) and is appropriately not on any antibiotics at this time.  Will monitor with hemodialysis and await on radiology opinion for thoracentesis as earlier ordered by the admitting service. 2.  End-stage renal disease: Will undertake hemodialysis today and continue this on a TTS schedule.  She does not appear to be floridly volume overloaded based on physical exam and her ability to  lay flat in bed with fairly decent oxygen saturation. 3.  Hypertension: Blood pressure noted to be elevated overnight, monitor with resumption of oral antihypertensive therapy and with hemodialysis/ultrafiltration today. 4.  Anemia: Likely secondary to chronic illness and exacerbated by acute hospitalization +/- volume excess.  Monitor trend and resume ESA at outpatient dialysis.  No indication for PRBC transfusion. 5.  Secondary hyperparathyroidism: Corrected calcium  level within acceptable range, monitor phosphorus trend and resume renal diet/phosphorus binder.  Gordy MARLA Blanch 01/03/2024, 8:01 AM

## 2024-01-03 NOTE — Progress Notes (Signed)
 PROGRESS NOTE    Sheri Cooper  FMW:[id number] DOB: 10-Dec-1950 DOA: 01/02/2024 PCP: Toribio Jerel MATSU, MD   Brief Narrative:    Sheri Cooper is a 73 y.o. female with medical history significant for hypertension, COPD, anemia, and ESRD on hemodialysis who presents with progressive shortness of breath.   Patient underwent left thoracentesis on 12/14/2023 and now reports insidiously worsening dyspnea over the past 1 to 2 weeks.  She was admitted for evaluation of progressive shortness of breath and there are plans for potential thoracentesis 9/3.  Hemodialysis to be performed 9/2.  Assessment & Plan:   Principal Problem:   Acute respiratory failure with hypoxia (HCC) Active Problems:   ESRD on hemodialysis (HCC)   Essential hypertension   COPD (chronic obstructive pulmonary disease) (HCC)   Anemia   Recurrent pleural effusion  Assessment and Plan:   1.  Progressive shortness of breath with questionable relation to pleural effusion - No infectious symptoms; left pleural fluid was transudate last month  - Consult IR for thoracentesis, restrict fluids, continue supplemental O2 as-needed, IR will perform thoracentesis 9/3   2. ESRD TTS - Nephrology consulted by ED physician with plans for dialysis today - Restrict fluids, renally-dose medications     3. Anemia  - No overt bleeding  - Monitor   -Transfuse for hemoglobin less than 7 and resume ESA with outpatient dialysis   4. COPD  - Not in exacerbation  - Continue short-acting bronchodilators as-needed    5. Hypertension  - Amlodipine      DVT prophylaxis:Heparin  Code Status: Full Family Communication: None at bedside Disposition Plan:  Status is: Inpatient Remains inpatient appropriate because: Need for inpatient procedure  Consultants:  Nephrology IR  Procedures:  None  Antimicrobials:  None   Subjective: Patient seen and evaluated today with no new acute complaints or concerns. No acute concerns  or events noted overnight.  She denies any current significant shortness of breath.  Objective: Vitals:   01/03/24 0400 01/03/24 0500 01/03/24 0700 01/03/24 0722  BP: (!) 171/42 (!) 183/39 (!) 175/72   Pulse: 94  89   Resp: (!) 23 (!) 21    Temp: 98.3 F (36.8 C)   98 F (36.7 C)  TempSrc: Oral   Oral  SpO2: 94%  94%   Weight:      Height:       No intake or output data in the 24 hours ending 01/03/24 0947 Filed Weights   01/02/24 2046 01/03/24 0222  Weight: 40.9 kg 41.2 kg    Examination:  General exam: Appears calm and comfortable  Respiratory system: Clear to auscultation. Respiratory effort normal.  Currently on room air. Cardiovascular system: S1 & S2 heard, RRR.  Gastrointestinal system: Abdomen is soft Central nervous system: Alert and awake Extremities: No edema Skin: No significant lesions noted Psychiatry: Flat affect.    Data Reviewed: I have personally reviewed following labs and imaging studies  CBC: Recent Labs  Lab 01/02/24 2053 01/03/24 0440  WBC 10.7* 9.4  HGB 8.1* 7.1*  HCT 25.2* 22.3*  MCV 89.0 89.2  PLT 431* 393   Basic Metabolic Panel: Recent Labs  Lab 01/02/24 2053 01/03/24 0440  NA 142 142  K 4.4 4.7  CL 107 110  CO2 21* 22  GLUCOSE 134* 80  BUN 25* 26*  CREATININE 7.01* 7.60*  CALCIUM  8.2* 8.2*  MG 2.1  --    GFR: Estimated Creatinine Clearance: 4.2 mL/min (A) (by C-G formula based on SCr of  7.6 mg/dL (H)). Liver Function Tests: Recent Labs  Lab 01/02/24 2053 01/03/24 0440  AST 15 13*  ALT 8 6  ALKPHOS 89 71  BILITOT 0.6 0.7  PROT 6.5 5.8*  ALBUMIN  3.0* 2.6*   No results for input(s): LIPASE, AMYLASE in the last 168 hours. No results for input(s): AMMONIA in the last 168 hours. Coagulation Profile: No results for input(s): INR, PROTIME in the last 168 hours. Cardiac Enzymes: No results for input(s): CKTOTAL, CKMB, CKMBINDEX, TROPONINI in the last 168 hours. BNP (last 3 results) No results for  input(s): PROBNP in the last 8760 hours. HbA1C: No results for input(s): HGBA1C in the last 72 hours. CBG: No results for input(s): GLUCAP in the last 168 hours. Lipid Profile: No results for input(s): CHOL, HDL, LDLCALC, TRIG, CHOLHDL, LDLDIRECT in the last 72 hours. Thyroid  Function Tests: No results for input(s): TSH, T4TOTAL, FREET4, T3FREE, THYROIDAB in the last 72 hours. Anemia Panel: No results for input(s): VITAMINB12, FOLATE, FERRITIN, TIBC, IRON , RETICCTPCT in the last 72 hours. Sepsis Labs: No results for input(s): PROCALCITON, LATICACIDVEN in the last 168 hours.  Recent Results (from the past 240 hours)  Resp panel by RT-PCR (RSV, Flu A&B, Covid) Anterior Nasal Swab     Status: None   Collection Time: 01/02/24  8:56 PM   Specimen: Anterior Nasal Swab  Result Value Ref Range Status   SARS Coronavirus 2 by RT PCR NEGATIVE NEGATIVE Final    Comment: (NOTE) SARS-CoV-2 target nucleic acids are NOT DETECTED.  The SARS-CoV-2 RNA is generally detectable in upper respiratory specimens during the acute phase of infection. The lowest concentration of SARS-CoV-2 viral copies this assay can detect is 138 copies/mL. A negative result does not preclude SARS-Cov-2 infection and should not be used as the sole basis for treatment or other patient management decisions. A negative result may occur with  improper specimen collection/handling, submission of specimen other than nasopharyngeal swab, presence of viral mutation(s) within the areas targeted by this assay, and inadequate number of viral copies(<138 copies/mL). A negative result must be combined with clinical observations, patient history, and epidemiological information. The expected result is Negative.  Fact Sheet for Patients:  BloggerCourse.com  Fact Sheet for Healthcare Providers:  SeriousBroker.it  This test is no t yet  approved or cleared by the [place]  FDA and  has been authorized for detection and/or diagnosis of SARS-CoV-2 by FDA under an Emergency Use Authorization (EUA). This EUA will remain  in effect (meaning this test can be used) for the duration of the COVID-19 declaration under Section 564(b)(1) of the Act, 21 U.S.C.section 360bbb-3(b)(1), unless the authorization is terminated  or revoked sooner.       Influenza A by PCR NEGATIVE NEGATIVE Final   Influenza B by PCR NEGATIVE NEGATIVE Final    Comment: (NOTE) The Xpert Xpress SARS-CoV-2/FLU/RSV plus assay is intended as an aid in the diagnosis of influenza from Nasopharyngeal swab specimens and should not be used as a sole basis for treatment. Nasal washings and aspirates are unacceptable for Xpert Xpress SARS-CoV-2/FLU/RSV testing.  Fact Sheet for Patients: BloggerCourse.com  Fact Sheet for Healthcare Providers: SeriousBroker.it  This test is not yet approved or cleared by the [place]  FDA and has been authorized for detection and/or diagnosis of SARS-CoV-2 by FDA under an Emergency Use Authorization (EUA). This EUA will remain in effect (meaning this test can be used) for the duration of the COVID-19 declaration under Section 564(b)(1) of the Act, 21 U.S.C. section 360bbb-3(b)(1), unless  the authorization is terminated or revoked.     Resp Syncytial Virus by PCR NEGATIVE NEGATIVE Final    Comment: (NOTE) Fact Sheet for Patients: BloggerCourse.com  Fact Sheet for Healthcare Providers: SeriousBroker.it  This test is not yet approved or cleared by the [place]  FDA and has been authorized for detection and/or diagnosis of SARS-CoV-2 by FDA under an Emergency Use Authorization (EUA). This EUA will remain in effect (meaning this test can be used) for the duration of the COVID-19 declaration under Section 564(b)(1) of  the Act, 21 U.S.C. section 360bbb-3(b)(1), unless the authorization is terminated or revoked.  Performed at [facility], [address] [place], [place] [address]   MRSA Next Gen by PCR, Nasal     Status: None   Collection Time: 01/03/24  1:34 AM   Specimen: Nasal Mucosa; Nasal Swab  Result Value Ref Range Status   MRSA by PCR Next Gen NOT DETECTED NOT DETECTED Final    Comment: (NOTE) The GeneXpert MRSA Assay (FDA approved for NASAL specimens only), is one component of a comprehensive MRSA colonization surveillance program. It is not intended to diagnose MRSA infection nor to guide or monitor treatment for MRSA infections. Test performance is not FDA approved in patients less than 48 years old. Performed at [facility], [address], [place], [place] [address]       Radiology Studies: CT Chest Wo Contrast Result Date: 01/02/2024 CLINICAL DATA:  Shortness of breath EXAM: CT CHEST WITHOUT CONTRAST TECHNIQUE: Multidetector CT imaging of the chest was performed following the standard protocol without IV contrast. RADIATION DOSE REDUCTION: This exam was performed according to the departmental dose-optimization program which includes automated exposure control, adjustment of the mA and/or kV according to patient size and/or use of iterative reconstruction technique. COMPARISON:  Chest x-ray from earlier in the same day. FINDINGS: Cardiovascular: Atherosclerotic calcifications of the aorta are noted. No aneurysmal dilatation is seen. Heart is mildly enlarged. Mild coronary calcifications are noted. No enlargement of the pulmonary artery is seen. Mediastinum/Nodes: Thoracic inlet is within normal limits. No hilar or mediastinal adenopathy is noted. The esophagus is within normal limits as visualized. Lungs/Pleura: There is a right-sided pleural effusion with lower lobe consolidation present. The size of the effusion has improved from a prior CT from May of 2024 although stable appearing when  compared with the recent plain film. Similar findings are noted on the left with an effusion which is slightly larger than that on the right and relatively stable from the prior exam. The degree lower lobe consolidation however has increased. No parenchymal nodules are noted. Upper Abdomen: Visualized upper abdomen is unremarkable. Musculoskeletal: No chest wall mass or suspicious bone lesions identified. IMPRESSION: Bilateral pleural effusions with associated lower lobe consolidation left greater than right. No other focal abnormality is noted. Aortic Atherosclerosis (ICD10-I70.0). Electronically Signed   By: Oneil Devonshire M.D.   On: 01/02/2024 22:53   DG Chest Port 1 View Result Date: 01/02/2024 CLINICAL DATA:  Shortness of breath EXAM: PORTABLE CHEST 1 VIEW COMPARISON:  Chest x-ray 12/14/2023 FINDINGS: The heart is enlarged. There are small bilateral pleural effusions similar to the prior study. There some increasing patchy opacities in the left lung base and left mid lung. There is no pneumothorax. There are atherosclerotic calcifications of the aorta. No acute fractures are seen. IMPRESSION: 1. Increasing patchy opacities in the left lung base and left mid lung, which may represent atelectasis or pneumonia. 2. Small bilateral pleural effusions. Electronically Signed   By:  Greig Pique M.D.   On: 01/02/2024 21:49    Scheduled Meds:  amLODipine   10 mg Oral Daily   Chlorhexidine  Gluconate Cloth  6 each Topical Daily   heparin   5,000 Units Subcutaneous Q8H   sevelamer  carbonate  2,400 mg Oral TID WC   sodium chloride  flush  3 mL Intravenous Q12H     LOS: 1 day    Time spent: 55 minutes    Aleksandr Pellow JONETTA Fairly, DO [facility]  If 7PM-7AM, please contact night-coverage [email or web address] 01/03/2024, 9:47 AM

## 2024-01-03 NOTE — Procedures (Signed)
 Received patient in bed to unit.  Alert and oriented.  Informed consent signed and in chart.  LUA AVG cannulated x 2 with 15g needles per policy, without difficulty. Secured well with tape. Pt tolerated well. Tx initiated per MD order. No bleeding or distress noted. Pt c/o cramping in legs. UF off, NS bolus,100 ml, given x 1 without relief, NS bolus, 100 ml, repeated with good results. Tx complete. Blood returned. Needles removed, sites held x 2 until hemostasis achieved, gauze changed prior to taping.  TX duration:3.75   Transported back to the room  Alert, without acute distress.  Hand-off given to patient's nurse.   Access used: LUA AVG Access issues: none  Total UF removed: 1700 Medication(s) given: See MAR    [facility]

## 2024-01-04 ENCOUNTER — Other Ambulatory Visit (HOSPITAL_COMMUNITY)

## 2024-01-04 ENCOUNTER — Inpatient Hospital Stay (HOSPITAL_COMMUNITY)

## 2024-01-04 ENCOUNTER — Encounter (HOSPITAL_COMMUNITY): Payer: Self-pay | Admitting: Family Medicine

## 2024-01-04 DIAGNOSIS — J9601 Acute respiratory failure with hypoxia: Secondary | ICD-10-CM | POA: Diagnosis not present

## 2024-01-04 LAB — HEPATITIS B SURFACE ANTIGEN: Hepatitis B Surface Ag: NONREACTIVE

## 2024-01-04 LAB — RENAL FUNCTION PANEL
Albumin: 3.3 g/dL — ABNORMAL LOW (ref 3.5–5.0)
Anion gap: 21 — ABNORMAL HIGH (ref 5–15)
BUN: 17 mg/dL (ref 8–23)
CO2: 24 mmol/L (ref 22–32)
Calcium: 8.8 mg/dL — ABNORMAL LOW (ref 8.9–10.3)
Chloride: 90 mmol/L — ABNORMAL LOW (ref 98–111)
Creatinine, Ser: 5.08 mg/dL — ABNORMAL HIGH (ref 0.44–1.00)
GFR, Estimated: 8 mL/min — ABNORMAL LOW (ref >60)
Glucose, Bld: 69 mg/dL — ABNORMAL LOW (ref 70–99)
Phosphorus: 5.5 mg/dL — ABNORMAL HIGH (ref 2.5–4.6)
Potassium: 3.7 mmol/L (ref 3.5–5.1)
Sodium: 135 mmol/L (ref 135–145)

## 2024-01-04 LAB — BASIC METABOLIC PANEL WITH GFR
Anion gap: 14 (ref 5–15)
BUN: 14 mg/dL (ref 8–23)
CO2: 28 mmol/L (ref 22–32)
Calcium: 8.5 mg/dL — ABNORMAL LOW (ref 8.9–10.3)
Chloride: 95 mmol/L — ABNORMAL LOW (ref 98–111)
Creatinine, Ser: 4.12 mg/dL — ABNORMAL HIGH (ref 0.44–1.00)
GFR, Estimated: 11 mL/min — ABNORMAL LOW (ref >60)
Glucose, Bld: 68 mg/dL — ABNORMAL LOW (ref 70–99)
Potassium: 3.8 mmol/L (ref 3.5–5.1)
Sodium: 137 mmol/L (ref 135–145)

## 2024-01-04 LAB — CBC
HCT: 27.5 % — ABNORMAL LOW (ref 36.0–46.0)
HCT: 30.5 % — ABNORMAL LOW (ref 36.0–46.0)
Hemoglobin: 8.7 g/dL — ABNORMAL LOW (ref 12.0–15.0)
Hemoglobin: 9.7 g/dL — ABNORMAL LOW (ref 12.0–15.0)
MCH: 27.8 pg (ref 26.0–34.0)
MCH: 28 pg (ref 26.0–34.0)
MCHC: 31.6 g/dL (ref 30.0–36.0)
MCHC: 31.8 g/dL (ref 30.0–36.0)
MCV: 87.9 fL (ref 80.0–100.0)
MCV: 87.9 fL (ref 80.0–100.0)
Platelets: 472 K/uL — ABNORMAL HIGH (ref 150–400)
Platelets: 484 K/uL — ABNORMAL HIGH (ref 150–400)
RBC: 3.13 MIL/uL — ABNORMAL LOW (ref 3.87–5.11)
RBC: 3.47 MIL/uL — ABNORMAL LOW (ref 3.87–5.11)
RDW: 20 % — ABNORMAL HIGH (ref 11.5–15.5)
RDW: 19.9 % — ABNORMAL HIGH (ref 11.5–15.5)
WBC: 9.2 K/uL (ref 4.0–10.5)
WBC: 9.7 K/uL (ref 4.0–10.5)
nRBC: 0 % (ref 0.0–0.2)
nRBC: 0 % (ref 0.0–0.2)

## 2024-01-04 LAB — BODY FLUID CELL COUNT WITH DIFFERENTIAL
Lymphs, Fluid: 78 %
Monocyte-Macrophage-Serous Fluid: 21 % — ABNORMAL LOW (ref 50–90)
Neutrophil Count, Fluid: 1 % (ref 0–25)
Total Nucleated Cell Count, Fluid: 1223 uL — ABNORMAL HIGH (ref 0–1000)

## 2024-01-04 LAB — GRAM STAIN

## 2024-01-04 LAB — PROTEIN, PLEURAL OR PERITONEAL FLUID: Total protein, fluid: <3 g/dL

## 2024-01-04 LAB — GLUCOSE, PLEURAL OR PERITONEAL FLUID: Glucose, Fluid: 81 mg/dL

## 2024-01-04 LAB — MAGNESIUM: Magnesium: 2.1 mg/dL (ref 1.7–2.4)

## 2024-01-04 MED ORDER — AMLODIPINE BESYLATE 10 MG PO TABS: 10.0000 mg | ORAL_TABLET | Freq: Every day | ORAL | 5 refills | Status: AC

## 2024-01-04 MED ORDER — METOPROLOL SUCCINATE ER 50 MG PO TB24
50.0000 mg | ORAL_TABLET | Freq: Once | ORAL | Status: AC
  Administered 2024-01-04: 50 mg via ORAL
  Filled 2024-01-04: qty 1

## 2024-01-04 MED ORDER — HYDRALAZINE HCL 50 MG PO TABS
50.0000 mg | ORAL_TABLET | Freq: Once | ORAL | Status: AC
  Administered 2024-01-04: 50 mg via ORAL
  Filled 2024-01-04: qty 1

## 2024-01-04 MED ORDER — LIDOCAINE HCL (PF) 2 % IJ SOLN
10.0000 mL | Freq: Once | INTRAMUSCULAR | Status: AC
  Administered 2024-01-04: 10 mL

## 2024-01-04 MED ORDER — HYDRALAZINE HCL 50 MG PO TABS: 50.0000 mg | ORAL_TABLET | Freq: Two times a day (BID) | ORAL | 3 refills | Status: AC

## 2024-01-04 MED ORDER — FERROUS SULFATE 325 (65 FE) MG PO TBEC: 325.0000 mg | DELAYED_RELEASE_TABLET | ORAL | 3 refills | Status: AC

## 2024-01-04 MED ORDER — METOPROLOL SUCCINATE ER 50 MG PO TB24: 50.0000 mg | ORAL_TABLET | Freq: Every day | ORAL | 5 refills | Status: AC

## 2024-01-04 MED ORDER — ALBUTEROL SULFATE (2.5 MG/3ML) 0.083% IN NEBU: 2.5000 mg | INHALATION_SOLUTION | Freq: Four times a day (QID) | RESPIRATORY_TRACT | 11 refills | Status: AC | PRN

## 2024-01-04 MED ORDER — LIDOCAINE HCL (PF) 2 % IJ SOLN
INTRAMUSCULAR | Status: AC
  Filled 2024-01-04: qty 10

## 2024-01-04 MED ORDER — ALBUTEROL SULFATE HFA 108 (90 BASE) MCG/ACT IN AERS: 2.0000 | INHALATION_SPRAY | Freq: Four times a day (QID) | RESPIRATORY_TRACT | 2 refills | Status: AC | PRN

## 2024-01-04 NOTE — Procedures (Signed)
 Ultrasound-guided diagnostic and therapeutic left sided thoracentesis performed yielding 350 milliliters of straw colored fluid. No immediate complications.   Diagnostic fluid was sent to the lab for further analysis. Follow-up chest x-ray pending. EBL is < 2 ml.

## 2024-01-04 NOTE — Progress Notes (Signed)
 Mobility Specialist Progress Note:    01/04/24 1100  Mobility  Activity Ambulated with assistance  Level of Assistance Modified independent, requires aide device or extra time  Assistive Device None  Distance Ambulated (ft) 270 ft  Range of Motion/Exercises Active;All extremities  Activity Response Tolerated well  Mobility Referral Yes  Mobility visit 1 Mobility  Mobility Specialist Start Time (ACUTE ONLY) 1100  Mobility Specialist Stop Time (ACUTE ONLY) 1120  Mobility Specialist Time Calculation (min) (ACUTE ONLY) 20 min   Pt received in bed, agreeable to mobility. ModI to stand and ambulate with no AD. Tolerated well, SpO2 97% on RA at rest. During ambulation SpO2 95-98% on RA and HR 134. Left sitting EOB, all needs met.  Alin Chavira Mobility Specialist Please contact via Special educational needs teacher or  Rehab office at [phone]

## 2024-01-04 NOTE — Discharge Instructions (Signed)
 1)Very Low-salt diet advised---Less than 2 gm of Sodium per day advised----ok to use Mrs DASH salt substitute instead of Salt 2)Weigh yourself daily, call if you gain more than 3 pounds in 1 day or more than 5 pounds in 1 week as your hemodialysis schedule and duration may need to be adjusted 3)Limit your Fluid  intake to no more than 40 ounces (1.2 Liters) per day 4)Avoid ibuprofen/Advil/Aleve/Motrin/Goody Powders/Naproxen/BC powders/Meloxicam/Diclofenac/Indomethacin and other Nonsteroidal anti-inflammatory medications as these will make you more likely to bleed and can cause stomach ulcers, can also cause Kidney problems- 5)Please remind your outpatient hemodialysis team including your nephrologist that your New estimated dry weight (EDW) is 40 kg (your New EDW 40kg and use profile 2 for your outpatient hemodialysis sessions) 6)Please follow-up with cardiothoracic surgeon Dr. Linnie Rayas For possible--Pleurodesis Procedure due to recurrent pleural effusion

## 2024-01-04 NOTE — Progress Notes (Signed)
 Patient ID: Sheri Cooper, female   DOB: 27-Apr-1951, 73 y.o.   MRN: [id number] S: Feels better since having HD yesterday. O:BP (!) 163/59 (BP Location: Right Arm)   Pulse 95   Temp 99.1 F (37.3 C) (Oral)   Resp 16   Ht 4' 11 (1.499 m)   Wt 41.2 kg   SpO2 95%   BMI 18.35 kg/m   Intake/Output Summary (Last 24 hours) at 01/04/2024 1028 Last data filed at 01/04/2024 0837 Gross per 24 hour  Intake 565 ml  Output 1800 ml  Net -1235 ml   Intake/Output: I/O last 3 completed shifts: In: 345 [P.O.:345] Out: 1800 [Other:1800]  Intake/Output this shift:  Total I/O In: 240 [P.O.:240] Out: -  Weight change:  Gen: NAD CVS: RRR Resp:friction rub of RLL, decreased BS at bases bilaterally Abd: +BS, soft, NT/nD Ext: no edema, LUE AVG +T/B  Recent Labs  Lab 01/02/24 2053 01/03/24 0440 01/04/24 0351  NA 142 142 137  K 4.4 4.7 3.8  CL 107 110 95*  CO2 21* 22 28  GLUCOSE 134* 80 68*  BUN 25* 26* 14  CREATININE 7.01* 7.60* 4.12*  ALBUMIN  3.0* 2.6*  --   CALCIUM  8.2* 8.2* 8.5*  AST 15 13*  --   ALT 8 6  --    Liver Function Tests: Recent Labs  Lab 01/02/24 2053 01/03/24 0440  AST 15 13*  ALT 8 6  ALKPHOS 89 71  BILITOT 0.6 0.7  PROT 6.5 5.8*  ALBUMIN  3.0* 2.6*   No results for input(s): LIPASE, AMYLASE in the last 168 hours. No results for input(s): AMMONIA in the last 168 hours. CBC: Recent Labs  Lab 01/02/24 2053 01/03/24 0440 01/04/24 0351  WBC 10.7* 9.4 9.2  HGB 8.1* 7.1* 8.7*  HCT 25.2* 22.3* 27.5*  MCV 89.0 89.2 87.9  PLT 431* 393 472*   Cardiac Enzymes: No results for input(s): CKTOTAL, CKMB, CKMBINDEX, TROPONINI in the last 168 hours. CBG: No results for input(s): GLUCAP in the last 168 hours.  Iron  Studies: No results for input(s): IRON , TIBC, TRANSFERRIN, FERRITIN in the last 72 hours. Studies/Results: CT Chest Wo Contrast Result Date: 01/02/2024 CLINICAL DATA:  Shortness of breath EXAM: CT CHEST WITHOUT CONTRAST  TECHNIQUE: Multidetector CT imaging of the chest was performed following the standard protocol without IV contrast. RADIATION DOSE REDUCTION: This exam was performed according to the departmental dose-optimization program which includes automated exposure control, adjustment of the mA and/or kV according to patient size and/or use of iterative reconstruction technique. COMPARISON:  Chest x-ray from earlier in the same day. FINDINGS: Cardiovascular: Atherosclerotic calcifications of the aorta are noted. No aneurysmal dilatation is seen. Heart is mildly enlarged. Mild coronary calcifications are noted. No enlargement of the pulmonary artery is seen. Mediastinum/Nodes: Thoracic inlet is within normal limits. No hilar or mediastinal adenopathy is noted. The esophagus is within normal limits as visualized. Lungs/Pleura: There is a right-sided pleural effusion with lower lobe consolidation present. The size of the effusion has improved from a prior CT from May of 2024 although stable appearing when compared with the recent plain film. Similar findings are noted on the left with an effusion which is slightly larger than that on the right and relatively stable from the prior exam. The degree lower lobe consolidation however has increased. No parenchymal nodules are noted. Upper Abdomen: Visualized upper abdomen is unremarkable. Musculoskeletal: No chest wall mass or suspicious bone lesions identified. IMPRESSION: Bilateral pleural effusions with associated lower lobe consolidation left  greater than right. No other focal abnormality is noted. Aortic Atherosclerosis (ICD10-I70.0). Electronically Signed   By: Oneil Devonshire M.D.   On: 01/02/2024 22:53   DG Chest Port 1 View Result Date: 01/02/2024 CLINICAL DATA:  Shortness of breath EXAM: PORTABLE CHEST 1 VIEW COMPARISON:  Chest x-ray 12/14/2023 FINDINGS: The heart is enlarged. There are small bilateral pleural effusions similar to the prior study. There some increasing patchy  opacities in the left lung base and left mid lung. There is no pneumothorax. There are atherosclerotic calcifications of the aorta. No acute fractures are seen. IMPRESSION: 1. Increasing patchy opacities in the left lung base and left mid lung, which may represent atelectasis or pneumonia. 2. Small bilateral pleural effusions. Electronically Signed   By: Greig Pique M.D.   On: 01/02/2024 21:49    amLODipine   10 mg Oral Daily   Chlorhexidine  Gluconate Cloth  6 each Topical Daily   heparin   5,000 Units Subcutaneous Q8H   sevelamer  carbonate  2,400 mg Oral TID WC   sodium chloride  flush  3 mL Intravenous Q12H    BMET    Component Value Date/Time   NA 137 01/04/2024 0351   K 3.8 01/04/2024 0351   CL 95 (L) 01/04/2024 0351   CO2 28 01/04/2024 0351   GLUCOSE 68 (L) 01/04/2024 0351   BUN 14 01/04/2024 0351   CREATININE 4.12 (H) 01/04/2024 0351   CALCIUM  8.5 (L) 01/04/2024 0351   GFRNONAA 11 (L) 01/04/2024 0351   CBC    Component Value Date/Time   WBC 9.2 01/04/2024 0351   RBC 3.13 (L) 01/04/2024 0351   HGB 8.7 (L) 01/04/2024 0351   HCT 27.5 (L) 01/04/2024 0351   PLT 472 (H) 01/04/2024 0351   MCV 87.9 01/04/2024 0351   MCH 27.8 01/04/2024 0351   MCHC 31.6 01/04/2024 0351   RDW 20.0 (H) 01/04/2024 0351   LYMPHSABS 1.6 12/08/2023 0448   MONOABS 0.8 12/08/2023 0448   EOSABS 0.0 12/08/2023 0448   BASOSABS 0.0 12/08/2023 0448   Outpatient HD orders:  [facility], TTS Edw 42kg, 3:00, 3K/2.5Ca, LUE AVG, BFR 350 (16 gauge)/DFR 500 Heparin  none Venofer  100 mg IV qtx Micera 60 mcg IV every 2 weeks (last given 12/27/23)  Assessment/Plan: 1.  Acute hypoxic respiratory failure: Imaging shows bilateral pleural effusions L>R per CT scan, also with consolidation of the bases.  Consolidations may be due to of edema versus pneumonia.  She does not have any indications of infection (no leukocytosis or fever) and is appropriately not on any antibiotics at this time.  She did improve with HD and UF  of 1.8L yesterday.  Will plan for HD again tomorrow and await on radiology opinion for thoracentesis as she responded well to this at her last admission.  If she has recurrent pleural effusions, she may benefit from pleurodesis.  She does have a friction rub in the RLL so likely chronic issue. 2.  End-stage renal disease: Will continue with her outpatient TTS schedule.  She did improve after HD. 3.  Hypertension: Blood pressure noted to be elevated overnight, monitor with resumption of oral antihypertensive therapy and with hemodialysis/ultrafiltration today. 4.  Anemia: Likely secondary to chronic illness and exacerbated by acute hospitalization +/- volume excess.  Monitor trend and resume ESA at outpatient dialysis.  No indication for PRBC transfusion. 5.  Secondary hyperparathyroidism: Corrected calcium  level within acceptable range, monitor phosphorus trend and resume renal diet/phosphorus binder.  Fairy RONAL Sellar, MD [facility], [place]

## 2024-01-04 NOTE — Progress Notes (Addendum)
 D/c orders noted. Have contacted out-pt HD clinic to inofrm of pt d/c and anticipated arrival tomorrow. d/c summary and last note faxed to [facility].  Nadine Ryle D[facility] Nav [phone]

## 2024-01-04 NOTE — Care Management Important Message (Signed)
 Important Message  Patient Details  Name: Sheri Cooper MRN: [id number] Date of Birth: [birth year]   Important Message Given:  N/A - LOS <3 / Initial given by admissions     [name] 01/04/2024, 1:29 PM

## 2024-01-04 NOTE — Progress Notes (Signed)
 Noted dressing to L mid back region due to thoracentesis, no drainage noted, dressing intact.

## 2024-01-04 NOTE — Discharge Summary (Signed)
 Sheri Cooper, is a 73 y.o. female  DOB Sep 22, 1950  MRN [id number].  Admission date:  01/02/2024  Admitting Physician  Evalene RAMAN Sprinkles, MD  Discharge Date:  01/04/2024   Primary MD  Toribio Jerel MATSU, MD  Recommendations for primary care physician for things to follow:  1)Very Low-salt diet advised---Less than 2 gm of Sodium per day advised----ok to use Mrs DASH salt substitute instead of Salt 2)Weigh yourself daily, call if you gain more than 3 pounds in 1 day or more than 5 pounds in 1 week as your hemodialysis schedule and duration may need to be adjusted 3)Limit your Fluid  intake to no more than 40 ounces (1.2 Liters) per day 4)Avoid ibuprofen/Advil/Aleve/Motrin/Goody Powders/Naproxen/BC powders/Meloxicam/Diclofenac/Indomethacin and other Nonsteroidal anti-inflammatory medications as these will make you more likely to bleed and can cause stomach ulcers, can also cause Kidney problems- 5)Please remind your outpatient hemodialysis team including your nephrologist that your New estimated dry weight (EDW) is 40 kg (your New EDW 40kg and use profile 2 for your outpatient hemodialysis sessions) 6)Please follow-up with cardiothoracic surgeon Dr. Linnie Rayas For possible--Pleurodesis Procedure due to recurrent pleural effusion  Admission Diagnosis  Acute respiratory failure with hypoxia (HCC) [J96.01]  Discharge Diagnosis  Acute respiratory failure with hypoxia (HCC) [J96.01]    Principal Problem:   Acute respiratory failure with hypoxia (HCC) Active Problems:   ESRD on hemodialysis (HCC)   Essential hypertension   COPD (chronic obstructive pulmonary disease) (HCC)   Anemia   Recurrent pleural effusion      Past Medical History:  Diagnosis Date   Anemia    Chronic kidney disease (CKD), stage IV (severe) (HCC)    COPD (chronic obstructive pulmonary disease) (HCC)    ESRD (end stage renal disease)  (HCC)    Hemodialysis T, TH, SAT, [address] , [place]   GERD (gastroesophageal reflux disease)    History of blood transfusion    History of blood transfusion    Hypertension    Pneumonia    3 times in 2024-   PUD (peptic ulcer disease) 05/2022   Rheumatoid arthritis (HCC)    Rheumatoid arthritis (HCC)     Past Surgical History:  Procedure Laterality Date   A/V SHUNTOGRAM N/A 05/02/2023   Procedure: A/V SHUNTOGRAM;  Surgeon: Pearline Norman RAMAN, MD;  Location: [facility] INVASIVE CV LAB;  Service: Cardiovascular;  Laterality: N/A;   AV FISTULA PLACEMENT Left 11/29/2022   Procedure: INSERTION OF LEFT UPPER  ARTERIOVENOUS (AV) GORE-TEX GRAFT ARM;  Surgeon: Lanis Fonda BRAVO, MD;  Location: [facility] OR;  Service: Vascular;  Laterality: Left;   CENTRAL VENOUS CATHETER INSERTION Left 05/27/2022   Procedure: INSERTION CENTRAL LINE ADULT;  Surgeon: Kallie Manuelita BROCKS, MD;  Location: [facility] ORS;  Service: General;  Laterality: Left;   COLONOSCOPY WITH PROPOFOL  N/A 05/22/2022   Surgeon: Eartha Angelia Toribio, MD; blood in the terminal ileum and colon.   ESOPHAGOGASTRODUODENOSCOPY (EGD) WITH PROPOFOL  N/A 05/22/2022   Surgeon: Eartha Angelia, Toribio, MD; peptic ulcer disease with 2 nonbleeding cratered gastric ulcers,  multiple duodenal ulcers, 2 of which had adherent clots that were removed and revealed 2 vessels.  1 vessel was clipped, cauterized, and treated with epinephrine  injection.  Second vessel was clipped and treated with cautery, clip fell off.   IR FLUORO GUIDE CV LINE RIGHT  05/24/2022   IR US  GUIDE VASC ACCESS RIGHT  05/24/2022   LAPAROTOMY N/A 05/27/2022   Procedure: EXPLORATORY LAPAROTOMY, OMENTAL PATH, DRAIN PLACEMENT;  Surgeon: Kallie Manuelita BROCKS, MD;  Location: [facility] ORS;  Service: General;  Laterality: N/A;   PERIPHERAL VASCULAR BALLOON ANGIOPLASTY  05/02/2023   Procedure: PERIPHERAL VASCULAR BALLOON ANGIOPLASTY;  Surgeon: Pearline Norman RAMAN, MD;  Location: [facility] INVASIVE CV LAB;  Service:  Cardiovascular;;   PERIPHERAL VASCULAR THROMBECTOMY Left 08/18/2023   Procedure: PERIPHERAL VASCULAR THROMBECTOMY;  Surgeon: Melia Lynwood ORN, MD;  Location: [facility] INVASIVE CV LAB;  Service: Cardiovascular;  Laterality: Left;   VENOUS ANGIOPLASTY Left 08/18/2023   Procedure: VENOUS ANGIOPLASTY;  Surgeon: Melia Lynwood ORN, MD;  Location: [facility] INVASIVE CV LAB;  Service: Cardiovascular;  Laterality: Left;  80% Intragraft; 50% Venous Anastomosis   VOLVULUS REDUCTION     colon resection     HPI  from the history and physical done on the day of admission:  Chief Complaint: SOB    HPI: Sheri Cooper is a 73 y.o. female with medical history significant for hypertension, COPD, anemia, and ESRD on hemodialysis who presents with progressive shortness of breath.   Patient underwent left thoracentesis on 12/14/2023 and now reports insidiously worsening dyspnea over the past 1 to 2 weeks.  She has a nonproductive cough associated with this but denies fevers or chest pain.  She denies any lower extremity swelling.  She denies missing any dialysis appointments.   ED Course: Upon arrival to the ED, patient is found to be afebrile and saturating in the 80s on room air with tachypnea, mild tachycardia, and stable blood pressure.  Labs are most notable for bicarbonate 21, BUN 25, WBC 10,700, hemoglobin 8.1, and negative COVID, influenza, and RSV PCR.   Nephrology (Dr. Marlee) was consulted by the ED physician.   Review of Systems:  All other systems reviewed and apart from HPI, are negative.   Hospital Course:     Brief Narrative:  73 y.o. African-American female with medical history significant for COPD, end-stage renal disease on hemodialysis, GERD, hypertension, rheumatoid arthritis, PUD, and chronic anemia of ESRD admitted on 01/02/2024 with concerns for diastolic dysfunction CHF exacerbation concerns for volume overload leading to acute hypoxic respiratory failure in the setting of recurrent pleural effusion      -Assessment and Plan: 1)Acute on chronic heart failure with preserved ejection fraction -- -Echo from 09/27/2023 with EF of 60 to 65% -Overall much improved after hemodialysis on 01/03/2024 and left-sided thoracentesis on 01/04/2023 - Nephrology consult appreciated - Continue to use hemodialysis sessions to address volume status - Patient had left-sided Thora on 12/14/2023 and again on 01/04/2024 - Consider follow-up with CT surgery for possible pleurodesis -  2)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-Tuesday Thursday Saturday,  -Volume status and hypoxia improved after hemodialysis session and left-sided thoracentesis - - Nephrology consult appreciated - Low-salt diet and fluid restriction advised   3) chronic anemia of ESRD--Hgb stable >> 9  --defer to nephrology service timing of ESA/Procrit agents   4)HTN-continue losartan , hydralazine  and  amlodipine   5)COPD -no acute exacerbation at this time - Continue as needed albuterol    6)Bilateral pleural effusion left more than right--see #2 above - Status  post IR left-sided thoracentesis on 12/14/2023--cell count low - Fluid analysis by lights criteria consistent with transudate - With pleural fluid LDH of 102 and  serum LDH of 183 = 0.56 -Repeat left-sided thoracentesis on 01/04/2024----with removal of 250 mL of fluid---fluid studies pending -Consider outpatient follow-up with CT surgery for possible pleurodesis  - Continue to use hemodialysis to address pulm status  Discharge Condition: Stable without hypoxia  Follow UP   Follow-up Information     Shyrl Linnie KIDD, MD. Schedule an appointment as soon as possible for a visit.   Specialty: Cardiothoracic Surgery Why: For possible--Pleurodesis Procedure Contact information: [address] [place] [address] [phone]                 Consults obtained -nephrology and IR  Diet and Activity recommendation:  As advised  Discharge Instructions    Discharge  Instructions     Call MD for:  difficulty breathing, headache or visual disturbances   Complete by: As directed    Call MD for:  persistant dizziness or light-headedness   Complete by: As directed    Call MD for:  persistant nausea and vomiting   Complete by: As directed    Call MD for:  temperature >100.4   Complete by: As directed    Diet - low sodium heart healthy   Complete by: As directed    Discharge instructions   Complete by: As directed    1)Very Low-salt diet advised---Less than 2 gm of Sodium per day advised----ok to use Mrs DASH salt substitute instead of Salt 2)Weigh yourself daily, call if you gain more than 3 pounds in 1 day or more than 5 pounds in 1 week as your hemodialysis schedule and duration may need to be adjusted 3)Limit your Fluid  intake to no more than 40 ounces (1.2 Liters) per day 4)Avoid ibuprofen/Advil/Aleve/Motrin/Goody Powders/Naproxen/BC powders/Meloxicam/Diclofenac/Indomethacin and other Nonsteroidal anti-inflammatory medications as these will make you more likely to bleed and can cause stomach ulcers, can also cause Kidney problems- 5)Please remind your outpatient hemodialysis team including your nephrologist that your New estimated dry weight (EDW) is 40 kg (your New EDW 40kg and use profile 2 for your outpatient hemodialysis sessions) 6)Please follow-up with cardiothoracic surgeon Dr. Linnie Shyrl For possible--Pleurodesis Procedure due to recurrent pleural effusion   Increase activity slowly   Complete by: As directed          Discharge Medications     Allergies as of 01/04/2024   No Known Allergies      Medication List     TAKE these medications    acetaminophen  325 MG tablet Commonly known as: TYLENOL  Take 2 tablets (650 mg total) by mouth every 6 (six) hours as needed for mild pain (pain score 1-3) or fever (or Fever >/= 101).   albuterol  (2.5 MG/3ML) 0.083% nebulizer solution Commonly known as: PROVENTIL  Take 3 mLs (2.5 mg  total) by nebulization every 6 (six) hours as needed for wheezing or shortness of breath.   albuterol  108 (90 Base) MCG/ACT inhaler Commonly known as: VENTOLIN  HFA Inhale 2 puffs into the lungs every 6 (six) hours as needed for wheezing or shortness of breath.   amLODipine  10 MG tablet Commonly known as: NORVASC  Take 1 tablet (10 mg total) by mouth daily.   ferrous sulfate  325 (65 FE) MG EC tablet Take 1 tablet (325 mg total) by mouth every Monday, Wednesday, and Friday. Tuesday, Thursday, and Saturday at dialysis What changed: when to take  this   hydrALAZINE  50 MG tablet Commonly known as: APRESOLINE  Take 1 tablet (50 mg total) by mouth 2 (two) times daily.   metoprolol  succinate 50 MG 24 hr tablet Commonly known as: TOPROL -XL Take 1 tablet (50 mg total) by mouth daily.   sevelamer  carbonate 800 MG tablet Commonly known as: RENVELA  Take 2,400 mg by mouth 3 (three) times daily with meals.        Major procedures and Radiology Reports - PLEASE review detailed and final reports for all details, in brief -   DG Chest 1 View Result Date: 01/04/2024 CLINICAL DATA:  Post left side thoracentesis EXAM: CHEST  1 VIEW COMPARISON:  01/02/2024 FINDINGS: Small bilateral pleural effusions, decreasing on the left following thoracentesis. No pneumothorax. Cardiomegaly, bibasilar atelectasis. No overt edema. IMPRESSION: Decreasing left pleural effusion following thoracentesis. No pneumothorax. Small residual bilateral pleural effusions with bibasilar atelectasis. Electronically Signed   By: Franky Crease M.D.   On: 01/04/2024 14:30   US  THORACENTESIS ASP PLEURAL SPACE W/IMG GUIDE Result Date: 01/04/2024 INDICATION: 73 year old female. History of end-stage renal disease and heart failure. Found to have a left-sided pleural effusion. Team is requesting therapeutic and diagnostic thoracentesis. EXAM: ULTRASOUND GUIDED THERAPEUTIC AND DIAGNOSTIC LEFT-SIDED THORACENTESIS MEDICATIONS: Lidocaine  1% 10 mL  COMPLICATIONS: None immediate. PROCEDURE: An ultrasound guided thoracentesis was thoroughly discussed with the patient and questions answered. The benefits, risks, alternatives and complications were also discussed. The patient understands and wishes to proceed with the procedure. Written consent was obtained. Ultrasound was performed to localize and mark an adequate pocket of fluid in the left chest. The area was then prepped and draped in the normal sterile fashion. 1% Lidocaine  was used for local anesthesia. Under ultrasound guidance a 6 Fr Safe-T-Centesis catheter was introduced. Thoracentesis was performed. The catheter was removed and a dressing applied. FINDINGS: A total of approximately 350 mL of straw-colored fluid was removed. Samples were sent to the laboratory as requested by the clinical team. IMPRESSION: Successful ultrasound guided therapeutic and diagnostic left-sided thoracentesis yielding 350 mL of straw-colored pleural fluid. Performed by Delon Beagle NP Electronically Signed   By: Marcey Moan M.D.   On: 01/04/2024 14:16   CT Chest Wo Contrast Result Date: 01/02/2024 CLINICAL DATA:  Shortness of breath EXAM: CT CHEST WITHOUT CONTRAST TECHNIQUE: Multidetector CT imaging of the chest was performed following the standard protocol without IV contrast. RADIATION DOSE REDUCTION: This exam was performed according to the departmental dose-optimization program which includes automated exposure control, adjustment of the mA and/or kV according to patient size and/or use of iterative reconstruction technique. COMPARISON:  Chest x-ray from earlier in the same day. FINDINGS: Cardiovascular: Atherosclerotic calcifications of the aorta are noted. No aneurysmal dilatation is seen. Heart is mildly enlarged. Mild coronary calcifications are noted. No enlargement of the pulmonary artery is seen. Mediastinum/Nodes: Thoracic inlet is within normal limits. No hilar or mediastinal adenopathy is noted. The  esophagus is within normal limits as visualized. Lungs/Pleura: There is a right-sided pleural effusion with lower lobe consolidation present. The size of the effusion has improved from a prior CT from May of 2024 although stable appearing when compared with the recent plain film. Similar findings are noted on the left with an effusion which is slightly larger than that on the right and relatively stable from the prior exam. The degree lower lobe consolidation however has increased. No parenchymal nodules are noted. Upper Abdomen: Visualized upper abdomen is unremarkable. Musculoskeletal: No chest wall mass or suspicious bone lesions identified. IMPRESSION:  Bilateral pleural effusions with associated lower lobe consolidation left greater than right. No other focal abnormality is noted. Aortic Atherosclerosis (ICD10-I70.0). Electronically Signed   By: Oneil Devonshire M.D.   On: 01/02/2024 22:53   DG Chest Port 1 View Result Date: 01/02/2024 CLINICAL DATA:  Shortness of breath EXAM: PORTABLE CHEST 1 VIEW COMPARISON:  Chest x-ray 12/14/2023 FINDINGS: The heart is enlarged. There are small bilateral pleural effusions similar to the prior study. There some increasing patchy opacities in the left lung base and left mid lung. There is no pneumothorax. There are atherosclerotic calcifications of the aorta. No acute fractures are seen. IMPRESSION: 1. Increasing patchy opacities in the left lung base and left mid lung, which may represent atelectasis or pneumonia. 2. Small bilateral pleural effusions. Electronically Signed   By: Greig Pique M.D.   On: 01/02/2024 21:49   US  THORACENTESIS ASP PLEURAL SPACE W/IMG GUIDE Result Date: 12/15/2023 INDICATION: Patient with a history of end-stage renal disease and heart failure presents today with bilateral pleural effusions. Interventional Radiology asked to perform a diagnostic and therapeutic thoracentesis. EXAM: ULTRASOUND GUIDED THORACENTESIS MEDICATIONS: % lidocaine  10 mL  COMPLICATIONS: None immediate. PROCEDURE: An ultrasound guided thoracentesis was thoroughly discussed with the patient and questions answered. The benefits, risks, alternatives and complications were also discussed. The patient understands and wishes to proceed with the procedure. Written consent was obtained. Ultrasound was performed to localize and mark an adequate pocket of fluid in the left chest. The area was then prepped and draped in the normal sterile fashion. 1% Lidocaine  was used for local anesthesia. Under ultrasound guidance a 6 Fr Safe-T-Centesis catheter was introduced. Thoracentesis was performed. The catheter was removed and a dressing applied. FINDINGS: A total of approximately 600 mL of clear yellow fluid was removed. Samples were sent to the laboratory as requested by the clinical team. IMPRESSION: Successful ultrasound guided right thoracentesis yielding 600 mL of pleural fluid. Procedure performed by Warren Dais, NP Electronically Signed   By: Ester Sides M.D.   On: 12/15/2023 06:00   DG Chest 1 View Result Date: 12/14/2023 CLINICAL DATA:  [id number] Status post thoracentesis [id number] EXAM: CHEST  1 VIEW COMPARISON:  December 12, 2023 FINDINGS: Unchanged small right pleural effusion. Interval decrease in size of the left pleural effusion, now small in volume. Patchy opacities in both lung bases, likely atelectasis. No pneumothorax. Moderate cardiomegaly. Tortuous aorta with aortic atherosclerosis. No acute fracture or destructive lesions. Multilevel thoracic osteophytosis. Surgical clips in the left breast. IMPRESSION: 1. Interval decrease in size of the left pleural effusion, now small in volume. No pneumothorax. 2. Unchanged small right pleural effusion. Electronically Signed   By: Rogelia Myers M.D.   On: 12/14/2023 16:18   DG Chest Portable 1 View Result Date: 12/12/2023 CLINICAL DATA:  Shortness of breath.  Bilateral feet numbness. EXAM: PORTABLE CHEST 1 VIEW COMPARISON:  Radiograph  earlier today FINDINGS: Stable cardiomegaly. Unchanged mediastinal contours. Unchanged bilateral pleural effusions, left greater than right with associated bibasilar opacities. Interstitial thickening suspicious for pulmonary edema, also unchanged. No pneumothorax. Left breast/axillary surgical clips. IMPRESSION: 1. Unchanged bilateral pleural effusions, left greater than right with associated bibasilar opacities. 2. Stable cardiomegaly. Unchanged interstitial thickening suspicious for pulmonary edema. Electronically Signed   By: Andrea Gasman M.D.   On: 12/12/2023 22:57   DG Chest Portable 1 View Result Date: 12/12/2023 CLINICAL DATA:  Shortness of breath and decreased breath sounds. EXAM: PORTABLE CHEST 1 VIEW COMPARISON:  Portable chest 12/05/2023 FINDINGS: Moderate pleural effusions on  the left greater than right continue to be seen, not significantly changed. Stable mild cardiomegaly, mild lower zonal interstitial edema and central vascular prominence. There are bibasilar opacities overlying the pleural effusions which could be atelectasis or consolidation and are also unchanged. The remaining lungs appear clear. The mediastinum is normally outlined. Aortic atherosclerosis. No new osseous finding.  Left axillary surgical clips. IMPRESSION: 1. No significant change in the appearance of the chest from 1 week ago. 2. Moderate pleural effusions on the left greater than right, with overlying atelectasis or consolidation. 3. Stable cardiomegaly and mild interstitial edema. Electronically Signed   By: Francis Quam M.D.   On: 12/12/2023 00:59   DG Chest Port 1 View Result Date: 12/05/2023 CLINICAL DATA:  Shortness of breath. EXAM: PORTABLE CHEST 1 VIEW COMPARISON:  Radiograph 2 days ago 12/03/2023 FINDINGS: Again seen cardiomegaly. Moderate pleural effusions are unchanged from recent exam. Again seen diffuse interstitial thickening suspicious for pulmonary edema. Bibasilar opacities are unchanged. No  pneumothorax. Left chest wall/axillary surgical clips. IMPRESSION: 1. Findings again suggestive of CHF. Unchanged cardiomegaly, moderate pleural effusions, and diffuse interstitial thickening suspicious for pulmonary edema. 2. Bibasilar opacities are unchanged. Electronically Signed   By: Andrea Gasman M.D.   On: 12/05/2023 21:18    Micro Results   Recent Results (from the past 240 hours)  Resp panel by RT-PCR (RSV, Flu A&B, Covid) Anterior Nasal Swab     Status: None   Collection Time: 01/02/24  8:56 PM   Specimen: Anterior Nasal Swab  Result Value Ref Range Status   SARS Coronavirus 2 by RT PCR NEGATIVE NEGATIVE Final    Comment: (NOTE) SARS-CoV-2 target nucleic acids are NOT DETECTED.  The SARS-CoV-2 RNA is generally detectable in upper respiratory specimens during the acute phase of infection. The lowest concentration of SARS-CoV-2 viral copies this assay can detect is 138 copies/mL. A negative result does not preclude SARS-Cov-2 infection and should not be used as the sole basis for treatment or other patient management decisions. A negative result may occur with  improper specimen collection/handling, submission of specimen other than nasopharyngeal swab, presence of viral mutation(s) within the areas targeted by this assay, and inadequate number of viral copies(<138 copies/mL). A negative result must be combined with clinical observations, patient history, and epidemiological information. The expected result is Negative.  Fact Sheet for Patients:  BloggerCourse.com  Fact Sheet for Healthcare Providers:  SeriousBroker.it  This test is no t yet approved or cleared by the [place]  FDA and  has been authorized for detection and/or diagnosis of SARS-CoV-2 by FDA under an Emergency Use Authorization (EUA). This EUA will remain  in effect (meaning this test can be used) for the duration of the COVID-19 declaration under  Section 564(b)(1) of the Act, 21 U.S.C.section 360bbb-3(b)(1), unless the authorization is terminated  or revoked sooner.       Influenza A by PCR NEGATIVE NEGATIVE Final   Influenza B by PCR NEGATIVE NEGATIVE Final    Comment: (NOTE) The Xpert Xpress SARS-CoV-2/FLU/RSV plus assay is intended as an aid in the diagnosis of influenza from Nasopharyngeal swab specimens and should not be used as a sole basis for treatment. Nasal washings and aspirates are unacceptable for Xpert Xpress SARS-CoV-2/FLU/RSV testing.  Fact Sheet for Patients: BloggerCourse.com  Fact Sheet for Healthcare Providers: SeriousBroker.it  This test is not yet approved or cleared by the [place]  FDA and has been authorized for detection and/or diagnosis of SARS-CoV-2 by FDA under an Emergency Use Authorization (EUA).  This EUA will remain in effect (meaning this test can be used) for the duration of the COVID-19 declaration under Section 564(b)(1) of the Act, 21 U.S.C. section 360bbb-3(b)(1), unless the authorization is terminated or revoked.     Resp Syncytial Virus by PCR NEGATIVE NEGATIVE Final    Comment: (NOTE) Fact Sheet for Patients: BloggerCourse.com  Fact Sheet for Healthcare Providers: SeriousBroker.it  This test is not yet approved or cleared by the [place]  FDA and has been authorized for detection and/or diagnosis of SARS-CoV-2 by FDA under an Emergency Use Authorization (EUA). This EUA will remain in effect (meaning this test can be used) for the duration of the COVID-19 declaration under Section 564(b)(1) of the Act, 21 U.S.C. section 360bbb-3(b)(1), unless the authorization is terminated or revoked.  Performed at [facility], [address], [place], [place] [address]   MRSA Next Gen by PCR, Nasal     Status: None   Collection Time: 01/03/24  1:34 AM   Specimen: Nasal Mucosa;  Nasal Swab  Result Value Ref Range Status   MRSA by PCR Next Gen NOT DETECTED NOT DETECTED Final    Comment: (NOTE) The GeneXpert MRSA Assay (FDA approved for NASAL specimens only), is one component of a comprehensive MRSA colonization surveillance program. It is not intended to diagnose MRSA infection nor to guide or monitor treatment for MRSA infections. Test performance is not FDA approved in patients less than 36 years old. Performed at [facility], [address], [place], [place] [address]   Gram stain     Status: None   Collection Time: 01/04/24  1:45 PM   Specimen: Peritoneal Washings  Result Value Ref Range Status   Specimen Description PERITONEAL  Final   Special Requests PERITONEAL  Final   Gram Stain   Final    ASCITIC NO ORGANISMS SEEN WBC PRESENT, PREDOMINANTLY MONONUCLEAR CYTOSPIN SMEAR Performed at [facility], [address], [place], [place] [address]    Report Status 01/04/2024 FINAL  Final   Today   Subjective    Sheri Cooper today has no new complaints  No fever  Or chills   No Nausea, Vomiting or Diarrhea          Patient has been seen and examined prior to discharge   Objective   Blood pressure (!) 170/51, pulse 97, temperature 99.1 F (37.3 C), temperature source Oral, resp. rate 20, height 4' 11 (1.499 m), weight 41.2 kg, SpO2 98%.   Intake/Output Summary (Last 24 hours) at 01/04/2024 1541 Last data filed at 01/04/2024 0837 Gross per 24 hour  Intake 465 ml  Output --  Net 465 ml   Exam Gen:- Awake Alert, no acute distress  HEENT:- [place].AT, No sclera icterus Neck-Supple Neck,No JVD,.  Lungs-improved air movement  on the left, pleural rub on the right  CV- S1, S2 normal, regular Abd-  +ve B.Sounds, Abd Soft, No tenderness,    Extremity/Skin:- No  edema,   good pulses Psych-affect is appropriate, oriented x3 Neuro-no new focal deficits, no tremors  MSK-LUE AVG +t/b    Data Review   CBC w Diff:  Lab Results  Component Value Date    WBC 9.7 01/04/2024   HGB 9.7 (L) 01/04/2024   HCT 30.5 (L) 01/04/2024   PLT 484 (H) 01/04/2024   LYMPHOPCT 13 12/08/2023   MONOPCT 7 12/08/2023   EOSPCT 0 12/08/2023   BASOPCT 0 12/08/2023   CMP:  Lab Results  Component Value Date   NA 135 01/04/2024  K 3.7 01/04/2024   CL 90 (L) 01/04/2024   CO2 24 01/04/2024   BUN 17 01/04/2024   CREATININE 5.08 (H) 01/04/2024   PROT 5.8 (L) 01/03/2024   ALBUMIN  3.3 (L) 01/04/2024   BILITOT 0.7 01/03/2024   ALKPHOS 71 01/03/2024   AST 13 (L) 01/03/2024   ALT 6 01/03/2024   Total Discharge time is about 33 minutes  Rendall Carwin M.D on 01/04/2024 at 3:41 PM  Go to [email or web address] -  for contact info  [facility] - Office  [phone]

## 2024-01-04 NOTE — Progress Notes (Signed)
 Patient has discharge orders, discharging teaching given and no further questions at this time. Awaiting arrival of family member at which at this time is 1700.

## 2024-01-04 NOTE — Progress Notes (Signed)
 Patient tolerated left Thoracentesis procedure well today and 350 mL of pleural fluid removed and sent to lab for processing. Patient verbalized understanding of discharge instructions and transported to xray at this time for post chest xray with no acute distress noted.

## 2024-01-04 NOTE — Plan of Care (Signed)

## 2024-01-04 NOTE — Progress Notes (Signed)
 Patient has discharge orders, discharge instructions given to patient and no further questions at this time. Patient wheeled down to main entrance via staff.

## 2024-01-05 ENCOUNTER — Encounter (HOSPITAL_COMMUNITY): Payer: Self-pay | Admitting: Emergency Medicine

## 2024-01-05 ENCOUNTER — Telehealth: Payer: Self-pay

## 2024-01-05 ENCOUNTER — Other Ambulatory Visit: Payer: Self-pay

## 2024-01-05 ENCOUNTER — Emergency Department (HOSPITAL_COMMUNITY)
Admission: EM | Admit: 2024-01-05 | Discharge: 2024-01-05 | Disposition: A | Discharge status: Home / Self Care | Source: Ambulatory Visit | Attending: Emergency Medicine | Admitting: Emergency Medicine

## 2024-01-05 DIAGNOSIS — Z992 Dependence on renal dialysis: Secondary | ICD-10-CM | POA: Insufficient documentation

## 2024-01-05 DIAGNOSIS — N186 End stage renal disease: Secondary | ICD-10-CM | POA: Diagnosis not present

## 2024-01-05 DIAGNOSIS — J449 Chronic obstructive pulmonary disease, unspecified: Secondary | ICD-10-CM | POA: Diagnosis not present

## 2024-01-05 DIAGNOSIS — I959 Hypotension, unspecified: Secondary | ICD-10-CM | POA: Diagnosis not present

## 2024-01-05 DIAGNOSIS — Z5329 Procedure and treatment not carried out because of patient's decision for other reasons: Secondary | ICD-10-CM | POA: Diagnosis not present

## 2024-01-05 DIAGNOSIS — Z743 Need for continuous supervision: Secondary | ICD-10-CM | POA: Diagnosis not present

## 2024-01-05 DIAGNOSIS — I951 Orthostatic hypotension: Secondary | ICD-10-CM | POA: Diagnosis not present

## 2024-01-05 LAB — HEPATITIS B SURFACE ANTIBODY, QUANTITATIVE: Hep B S AB Quant (Post): <3.5 m[IU]/mL — ABNORMAL LOW

## 2024-01-05 NOTE — Transitions of Care (Post Inpatient/ED Visit) (Signed)
   01/05/2024  Name: Sheri Cooper MRN: [id number] DOB: 31-May-1950  Today's TOC FU Call Status: Today's TOC FU Call Status:: Unsuccessful Call (1st Attempt) Unsuccessful Call (1st Attempt) Date: 01/05/24  Attempted to reach the patient regarding the most recent Inpatient/ED visit. HD T, TH, Sat at [name] HD Navigator [phone] [name] [phone]  Follow Up Plan: Additional outreach attempts will be made to reach the patient to complete the Transitions of Care (Post Inpatient/ED visit) call.    [name] MSN, RN RN Case Manager [place]  [facility] Office Hours M-F [phone] Direct Dial : [phone] Main Phone [phone]  Fax: [phone] [email or web address]

## 2024-01-05 NOTE — ED Provider Notes (Signed)
 [place] EMERGENCY DEPARTMENT AT [facility] Provider Note   CSN: [id number] Arrival date & time: 01/05/24  1723     Patient presents with: Hypotension   Sheri Cooper is a 73 y.o. female.  She has history of ESRD on HD Tuesday Thursday Saturday.  She has history of high cholesterol, iron  deficiency anemia, COPD, failure, peptic ulcer disease.  Was brought to the ER via EMS for evaluation of orthostatic hypotension at dialysis.  She states while she was there she did not want to be transferred there, the ED for fluids and some medication and something to eat and tried giving her some supplemental oxygen and her blood pressure was still low so they called the doctor and he insisted she be transferred to the ED for further evaluation.  At the time of my evaluation she stated she is not going to stay, she has already called her brother to come pick her up.  She is refusing any evaluation including medications, IV fluids, labs or even p.o. fluids.  States she only feels dizzy when she stands up.  No chest pain or shortness of breath.   HPI     Prior to Admission medications   Medication Sig Start Date End Date Taking? Authorizing Provider  acetaminophen  (TYLENOL ) 325 MG tablet Take 2 tablets (650 mg total) by mouth every 6 (six) hours as needed for mild pain (pain score 1-3) or fever (or Fever >/= 101). Patient not taking: Reported on 01/03/2024 12/15/23   Pearlean Manus, MD  albuterol  (PROVENTIL ) (2.5 MG/3ML) 0.083% nebulizer solution Take 3 mLs (2.5 mg total) by nebulization every 6 (six) hours as needed for wheezing or shortness of breath. 01/04/24   Pearlean Manus, MD  albuterol  (VENTOLIN  HFA) 108 (90 Base) MCG/ACT inhaler Inhale 2 puffs into the lungs every 6 (six) hours as needed for wheezing or shortness of breath. 01/04/24   Pearlean Manus, MD  amLODipine  (NORVASC ) 10 MG tablet Take 1 tablet (10 mg total) by mouth daily. 01/04/24   Pearlean Manus, MD  ferrous sulfate   325 (65 FE) MG EC tablet Take 1 tablet (325 mg total) by mouth every Monday, Wednesday, and Friday. Tuesday, Thursday, and Saturday at dialysis 01/04/24   Pearlean Manus, MD  hydrALAZINE  (APRESOLINE ) 50 MG tablet Take 1 tablet (50 mg total) by mouth 2 (two) times daily. 01/04/24   Pearlean Manus, MD  metoprolol  succinate (TOPROL -XL) 50 MG 24 hr tablet Take 1 tablet (50 mg total) by mouth daily. 01/04/24   Pearlean Manus, MD  sevelamer  carbonate (RENVELA ) 800 MG tablet Take 2,400 mg by mouth 3 (three) times daily with meals.    [provider]    Allergies: Patient has no known allergies.    Review of Systems  Updated Vital Signs BP (!) 89/41 Comment: standing  Pulse 98   Temp 98 F (36.7 C) (Oral)   Resp 19   SpO2 99%   Physical Exam Vitals and nursing note reviewed.  Constitutional:      General: She is not in acute distress.    Appearance: She is well-developed.  HENT:     Head: Normocephalic and atraumatic.     Mouth/Throat:     Mouth: Mucous membranes are moist.  Eyes:     Extraocular Movements: Extraocular movements intact.     Conjunctiva/sclera: Conjunctivae normal.  Cardiovascular:     Rate and Rhythm: Normal rate and regular rhythm.     Heart sounds: No murmur heard. Pulmonary:  Effort: Pulmonary effort is normal. No respiratory distress.     Breath sounds: Normal breath sounds.  Abdominal:     Palpations: Abdomen is soft.     Tenderness: There is no abdominal tenderness.  Musculoskeletal:        General: No swelling.     Cervical back: Neck supple.  Skin:    General: Skin is warm and dry.     Capillary Refill: Capillary refill takes less than 2 seconds.  Neurological:     General: No focal deficit present.     Mental Status: She is alert and oriented to person, place, and time.  Psychiatric:        Mood and Affect: Mood normal.     (all labs ordered are listed, but only abnormal results are displayed) Labs Reviewed - No data to  display  EKG: None  Radiology: DG Chest 1 View Result Date: 01/04/2024 CLINICAL DATA:  Post left side thoracentesis EXAM: CHEST  1 VIEW COMPARISON:  01/02/2024 FINDINGS: Small bilateral pleural effusions, decreasing on the left following thoracentesis. No pneumothorax. Cardiomegaly, bibasilar atelectasis. No overt edema. IMPRESSION: Decreasing left pleural effusion following thoracentesis. No pneumothorax. Small residual bilateral pleural effusions with bibasilar atelectasis. Electronically Signed   By: Franky Crease M.D.   On: 01/04/2024 14:30   US  THORACENTESIS ASP PLEURAL SPACE W/IMG GUIDE Result Date: 01/04/2024 INDICATION: 73 year old female. History of end-stage renal disease and heart failure. Found to have a left-sided pleural effusion. Team is requesting therapeutic and diagnostic thoracentesis. EXAM: ULTRASOUND GUIDED THERAPEUTIC AND DIAGNOSTIC LEFT-SIDED THORACENTESIS MEDICATIONS: Lidocaine  1% 10 mL COMPLICATIONS: None immediate. PROCEDURE: An ultrasound guided thoracentesis was thoroughly discussed with the patient and questions answered. The benefits, risks, alternatives and complications were also discussed. The patient understands and wishes to proceed with the procedure. Written consent was obtained. Ultrasound was performed to localize and mark an adequate pocket of fluid in the left chest. The area was then prepped and draped in the normal sterile fashion. 1% Lidocaine  was used for local anesthesia. Under ultrasound guidance a 6 Fr Safe-T-Centesis catheter was introduced. Thoracentesis was performed. The catheter was removed and a dressing applied. FINDINGS: A total of approximately 350 mL of straw-colored fluid was removed. Samples were sent to the laboratory as requested by the clinical team. IMPRESSION: Successful ultrasound guided therapeutic and diagnostic left-sided thoracentesis yielding 350 mL of straw-colored pleural fluid. Performed by Delon Beagle NP Electronically Signed    By: Marcey Moan M.D.   On: 01/04/2024 14:16     Procedures   Medications Ordered in the ED - No data to display                                  Medical Decision Making Differential diagnosis includes but is not limited to dehydration, anemia, sepsis, orthostatic hypotension, other  ED course: Patient came in for orthostatic hypotension at dialysis, she is feeling dizzy when she stands up but adamantly refusing any workup in the ER including trial of medications, IV fluids, p.o. fluids, lab work, EKG or any testing at all.  She has already called her brother and states she does not want to be here.  She states she is tired of being in the hospital and wants to go home.  We discussed the risk of organ failure and death or permanent disability if she goes home with blood pressure is too low and is not treated.  She verbalized  understanding and is willing to accept this risk.  She is alert and oriented and understands risk of going home and still is adamantly refusing any treatment.  She is going to sign out AGAINST MEDICAL ADVICE and was invited to come back if she changes her mind or feels worse.         Final diagnoses:  None    ED Discharge Orders     None          Suellen Sherran LABOR, PA-C 01/05/24 1845    Franklyn Sid SAILOR, MD 01/05/24 2046

## 2024-01-05 NOTE — Discharge Instructions (Signed)
 You are seen today for low blood pressure.  As discussed this can be dangerous and lead to organ failure or death.  We offered to get blood work, give you fluids and medications to try to get your blood pressure up.  You have refused any evaluation or treatment at this time and are leaving AGAINST MEDICAL ADVICE.  Since your blood pressure is low today please do not take your blood pressure medications tonight including your hydralazine  and amlodipine .  Please check your blood pressure at home.  If you change your mind please come back to the ER especially if you are feeling worse.  Call your kidney specialist tomorrow and your PCP.

## 2024-01-05 NOTE — ED Triage Notes (Signed)
 Pt picked up by ems from [facility] for orthostatic hypotension. Pt arrived a/o. Did finish her dialysis.

## 2024-01-05 NOTE — ED Notes (Signed)
 Orthostatics positive. Pt states she is only dizzy when she is standing. Pt became  a little dizzy with sitting from lying and but worse with standing from sitting.  Pt slightly unsteady when stood but then became steady after standing a few seconds.

## 2024-01-06 DIAGNOSIS — I10 Essential (primary) hypertension: Secondary | ICD-10-CM | POA: Diagnosis not present

## 2024-01-06 DIAGNOSIS — K219 Gastro-esophageal reflux disease without esophagitis: Secondary | ICD-10-CM | POA: Diagnosis not present

## 2024-01-06 DIAGNOSIS — J449 Chronic obstructive pulmonary disease, unspecified: Secondary | ICD-10-CM | POA: Diagnosis not present

## 2024-01-06 DIAGNOSIS — E782 Mixed hyperlipidemia: Secondary | ICD-10-CM | POA: Diagnosis not present

## 2024-01-06 DIAGNOSIS — J90 Pleural effusion, not elsewhere classified: Secondary | ICD-10-CM | POA: Diagnosis not present

## 2024-01-06 DIAGNOSIS — N186 End stage renal disease: Secondary | ICD-10-CM | POA: Diagnosis not present

## 2024-01-06 DIAGNOSIS — D649 Anemia, unspecified: Secondary | ICD-10-CM | POA: Diagnosis not present

## 2024-01-06 LAB — CYTOLOGY - NON PAP

## 2024-01-07 DIAGNOSIS — N186 End stage renal disease: Secondary | ICD-10-CM | POA: Diagnosis not present

## 2024-01-07 DIAGNOSIS — Z992 Dependence on renal dialysis: Secondary | ICD-10-CM | POA: Diagnosis not present

## 2024-01-09 LAB — CULTURE, BODY FLUID W GRAM STAIN -BOTTLE
Culture: NO GROWTH
Special Requests: ADEQUATE

## 2024-01-10 DIAGNOSIS — Z992 Dependence on renal dialysis: Secondary | ICD-10-CM | POA: Diagnosis not present

## 2024-01-10 DIAGNOSIS — N186 End stage renal disease: Secondary | ICD-10-CM | POA: Diagnosis not present

## 2024-01-11 LAB — CHOLESTEROL, BODY FLUID: Cholesterol, Fluid: 55 mg/dL

## 2024-01-12 DIAGNOSIS — N186 End stage renal disease: Secondary | ICD-10-CM | POA: Diagnosis not present

## 2024-01-12 DIAGNOSIS — Z992 Dependence on renal dialysis: Secondary | ICD-10-CM | POA: Diagnosis not present

## 2024-01-12 NOTE — Progress Notes (Unsigned)
 [address]       [place] [address]             [phone]                    Sheri Cooper [place] Medical Record #[id number] Date of Birth: 1950/06/18  Referring: Toribio Jerel MATSU, MD Primary Care: Toribio Jerel MATSU, MD Primary Cardiologist: None  Chief Complaint:   No chief complaint on file.   History of Present Illness:    Sheri Cooper 73 y.o. female ***    Past Medical History:  Diagnosis Date   Anemia    Chronic kidney disease (CKD), stage IV (severe) (HCC)    COPD (chronic obstructive pulmonary disease) (HCC)    ESRD (end stage renal disease) (HCC)    Hemodialysis T, TH, SAT, [address] [place]   GERD (gastroesophageal reflux disease)    History of blood transfusion    History of blood transfusion    Hypertension    Pneumonia    3 times in 2024-   PUD (peptic ulcer disease) 05/2022   Rheumatoid arthritis (HCC)    Rheumatoid arthritis (HCC)     Past Surgical History:  Procedure Laterality Date   A/V SHUNTOGRAM N/A 05/02/2023   Procedure: A/V SHUNTOGRAM;  Surgeon: Pearline Norman GORMAN, MD;  Location: [facility] INVASIVE CV LAB;  Service: Cardiovascular;  Laterality: N/A;   AV FISTULA PLACEMENT Left 11/29/2022   Procedure: INSERTION OF LEFT UPPER  ARTERIOVENOUS (AV) GORE-TEX GRAFT ARM;  Surgeon: Lanis Fonda BRAVO, MD;  Location: [facility] OR;  Service: Vascular;  Laterality: Left;   CENTRAL VENOUS CATHETER INSERTION Left 05/27/2022   Procedure: INSERTION CENTRAL LINE ADULT;  Surgeon: Kallie Manuelita BROCKS, MD;  Location: [facility] ORS;  Service: General;  Laterality: Left;   COLONOSCOPY WITH PROPOFOL  N/A 05/22/2022   Surgeon: Eartha Angelia Toribio, MD; blood in the terminal ileum and colon.   ESOPHAGOGASTRODUODENOSCOPY (EGD) WITH PROPOFOL  N/A 05/22/2022   Surgeon: Eartha Angelia, Toribio, MD; peptic ulcer disease with 2 nonbleeding cratered gastric ulcers, multiple duodenal ulcers, 2 of which had adherent clots that were removed and revealed 2  vessels.  1 vessel was clipped, cauterized, and treated with epinephrine  injection.  Second vessel was clipped and treated with cautery, clip fell off.   IR FLUORO GUIDE CV LINE RIGHT  05/24/2022   IR US  GUIDE VASC ACCESS RIGHT  05/24/2022   LAPAROTOMY N/A 05/27/2022   Procedure: EXPLORATORY LAPAROTOMY, OMENTAL PATH, DRAIN PLACEMENT;  Surgeon: Kallie Manuelita BROCKS, MD;  Location: [facility] ORS;  Service: General;  Laterality: N/A;   PERIPHERAL VASCULAR BALLOON ANGIOPLASTY  05/02/2023   Procedure: PERIPHERAL VASCULAR BALLOON ANGIOPLASTY;  Surgeon: Pearline Norman GORMAN, MD;  Location: [facility] INVASIVE CV LAB;  Service: Cardiovascular;;   PERIPHERAL VASCULAR THROMBECTOMY Left 08/18/2023   Procedure: PERIPHERAL VASCULAR THROMBECTOMY;  Surgeon: Melia Lynwood ORN, MD;  Location[facility] INVASIVE CV LAB;  Service: Cardiovascular;  Laterality: Left;   VENOUS ANGIOPLASTY Left 08/18/2023   Procedure: VENOUS ANGIOPLASTY;  Surgeon: Melia Lynwood ORN, MD;  Location: [facility] INVASIVE CV LAB;  Service: Cardiovascular;  Laterality: Left;  80% Intragraft; 50% Venous Anastomosis   VOLVULUS REDUCTION     colon resection    No family history on file.   Social History   Tobacco Use  Smoking Status Former   Types: Cigarettes   Passive exposure: Past  Smokeless Tobacco Never    Social History   Substance and Sexual Activity  Alcohol Use Not Currently     No Known Allergies  Current Outpatient Medications  Medication Sig Dispense Refill   acetaminophen  (TYLENOL ) 325 MG tablet Take 2 tablets (650 mg total) by mouth every 6 (six) hours as needed for mild pain (pain score 1-3) or fever (or Fever >/= 101). (Patient not taking: Reported on 01/03/2024)     albuterol  (PROVENTIL ) (2.5 MG/3ML) 0.083% nebulizer solution Take 3 mLs (2.5 mg total) by nebulization every 6 (six) hours as needed for wheezing or shortness of breath. 75 mL 11   albuterol  (VENTOLIN  HFA) 108 (90 Base) MCG/ACT inhaler Inhale 2 puffs into the lungs every 6 (six) hours as needed  for wheezing or shortness of breath. 25.5 g 2   amLODipine  (NORVASC ) 10 MG tablet Take 1 tablet (10 mg total) by mouth daily. 30 tablet 5   ferrous sulfate  325 (65 FE) MG EC tablet Take 1 tablet (325 mg total) by mouth every Monday, Wednesday, and Friday. Tuesday, Thursday, and Saturday at dialysis 45 tablet 3   hydrALAZINE  (APRESOLINE ) 50 MG tablet Take 1 tablet (50 mg total) by mouth 2 (two) times daily. 60 tablet 3   metoprolol  succinate (TOPROL -XL) 50 MG 24 hr tablet Take 1 tablet (50 mg total) by mouth daily. 30 tablet 5   sevelamer  carbonate (RENVELA ) 800 MG tablet Take 2,400 mg by mouth 3 (three) times daily with meals.     No current facility-administered medications for this visit.    ROS  PHYSICAL EXAMINATION: There were no vitals taken for this visit.  Physical Exam   Diagnostic Studies & Laboratory data:     Recent Radiology Findings:   DG Chest 1 View Result Date: 01/04/2024 CLINICAL DATA:  Post left side thoracentesis EXAM: CHEST  1 VIEW COMPARISON:  01/02/2024 FINDINGS: Small bilateral pleural effusions, decreasing on the left following thoracentesis. No pneumothorax. Cardiomegaly, bibasilar atelectasis. No overt edema. IMPRESSION: Decreasing left pleural effusion following thoracentesis. No pneumothorax. Small residual bilateral pleural effusions with bibasilar atelectasis. Electronically Signed   By: Franky Crease M.D.   On: 01/04/2024 14:30   US  THORACENTESIS ASP PLEURAL SPACE W/IMG GUIDE Result Date: 01/04/2024 INDICATION: 73 year old female. History of end-stage renal disease and heart failure. Found to have a left-sided pleural effusion. Team is requesting therapeutic and diagnostic thoracentesis. EXAM: ULTRASOUND GUIDED THERAPEUTIC AND DIAGNOSTIC LEFT-SIDED THORACENTESIS MEDICATIONS: Lidocaine  1% 10 mL COMPLICATIONS: None immediate. PROCEDURE: An ultrasound guided thoracentesis was thoroughly discussed with the patient and questions answered. The benefits, risks,  alternatives and complications were also discussed. The patient understands and wishes to proceed with the procedure. Written consent was obtained. Ultrasound was performed to localize and mark an adequate pocket of fluid in the left chest. The area was then prepped and draped in the normal sterile fashion. 1% Lidocaine  was used for local anesthesia. Under ultrasound guidance a 6 Fr Safe-T-Centesis catheter was introduced. Thoracentesis was performed. The catheter was removed and a dressing applied. FINDINGS: A total of approximately 350 mL of straw-colored fluid was removed. Samples were sent to the laboratory as requested by the clinical team. IMPRESSION: Successful ultrasound guided therapeutic and diagnostic left-sided thoracentesis yielding 350 mL of straw-colored pleural fluid. Performed by Delon Beagle NP Electronically Signed   By: Marcey Moan M.D.   On: 01/04/2024 14:16   CT Chest Wo Contrast Result Date: 01/02/2024 CLINICAL DATA:  Shortness of breath EXAM: CT CHEST WITHOUT CONTRAST TECHNIQUE: Multidetector CT imaging of the chest was performed following the standard protocol without IV contrast. RADIATION DOSE  REDUCTION: This exam was performed according to the departmental dose-optimization program which includes automated exposure control, adjustment of the mA and/or kV according to patient size and/or use of iterative reconstruction technique. COMPARISON:  Chest x-ray from earlier in the same day. FINDINGS: Cardiovascular: Atherosclerotic calcifications of the aorta are noted. No aneurysmal dilatation is seen. Heart is mildly enlarged. Mild coronary calcifications are noted. No enlargement of the pulmonary artery is seen. Mediastinum/Nodes: Thoracic inlet is within normal limits. No hilar or mediastinal adenopathy is noted. The esophagus is within normal limits as visualized. Lungs/Pleura: There is a right-sided pleural effusion with lower lobe consolidation present. The size of the effusion  has improved from a prior CT from May of 2024 although stable appearing when compared with the recent plain film. Similar findings are noted on the left with an effusion which is slightly larger than that on the right and relatively stable from the prior exam. The degree lower lobe consolidation however has increased. No parenchymal nodules are noted. Upper Abdomen: Visualized upper abdomen is unremarkable. Musculoskeletal: No chest wall mass or suspicious bone lesions identified. IMPRESSION: Bilateral pleural effusions with associated lower lobe consolidation left greater than right. No other focal abnormality is noted. Aortic Atherosclerosis (ICD10-I70.0). Electronically Signed   By: Oneil Devonshire M.D.   On: 01/02/2024 22:53   DG Chest Port 1 View Result Date: 01/02/2024 CLINICAL DATA:  Shortness of breath EXAM: PORTABLE CHEST 1 VIEW COMPARISON:  Chest x-ray 12/14/2023 FINDINGS: The heart is enlarged. There are small bilateral pleural effusions similar to the prior study. There some increasing patchy opacities in the left lung base and left mid lung. There is no pneumothorax. There are atherosclerotic calcifications of the aorta. No acute fractures are seen. IMPRESSION: 1. Increasing patchy opacities in the left lung base and left mid lung, which may represent atelectasis or pneumonia. 2. Small bilateral pleural effusions. Electronically Signed   By: Greig Pique M.D.   On: 01/02/2024 21:49   US  THORACENTESIS ASP PLEURAL SPACE W/IMG GUIDE Result Date: 12/15/2023 INDICATION: Patient with a history of end-stage renal disease and heart failure presents today with bilateral pleural effusions. Interventional Radiology asked to perform a diagnostic and therapeutic thoracentesis. EXAM: ULTRASOUND GUIDED THORACENTESIS MEDICATIONS: % lidocaine  10 mL COMPLICATIONS: None immediate. PROCEDURE: An ultrasound guided thoracentesis was thoroughly discussed with the patient and questions answered. The benefits, risks,  alternatives and complications were also discussed. The patient understands and wishes to proceed with the procedure. Written consent was obtained. Ultrasound was performed to localize and mark an adequate pocket of fluid in the left chest. The area was then prepped and draped in the normal sterile fashion. 1% Lidocaine  was used for local anesthesia. Under ultrasound guidance a 6 Fr Safe-T-Centesis catheter was introduced. Thoracentesis was performed. The catheter was removed and a dressing applied. FINDINGS: A total of approximately 600 mL of clear yellow fluid was removed. Samples were sent to the laboratory as requested by the clinical team. IMPRESSION: Successful ultrasound guided right thoracentesis yielding 600 mL of pleural fluid. Procedure performed by Warren Dais, NP Electronically Signed   By: Ester Sides M.D.   On: 12/15/2023 06:00   DG Chest 1 View Result Date: 12/14/2023 CLINICAL DATA:  [id number] Status post thoracentesis [id number] EXAM: CHEST  1 VIEW COMPARISON:  December 12, 2023 FINDINGS: Unchanged small right pleural effusion. Interval decrease in size of the left pleural effusion, now small in volume. Patchy opacities in both lung bases, likely atelectasis. No pneumothorax. Moderate cardiomegaly. Tortuous aorta with  aortic atherosclerosis. No acute fracture or destructive lesions. Multilevel thoracic osteophytosis. Surgical clips in the left breast. IMPRESSION: 1. Interval decrease in size of the left pleural effusion, now small in volume. No pneumothorax. 2. Unchanged small right pleural effusion. Electronically Signed   By: Rogelia Myers M.D.   On: 12/14/2023 16:18       I have independently reviewed the above radiology studies  and reviewed the findings with the patient.   Recent Lab Findings: Lab Results  Component Value Date   WBC 9.7 01/04/2024   HGB 9.7 (L) 01/04/2024   HCT 30.5 (L) 01/04/2024   PLT 484 (H) 01/04/2024   GLUCOSE 69 (L) 01/04/2024   TRIG 146 06/03/2022   ALT  6 01/03/2024   AST 13 (L) 01/03/2024   NA 135 01/04/2024   K 3.7 01/04/2024   CL 90 (L) 01/04/2024   CREATININE 5.08 (H) 01/04/2024   BUN 17 01/04/2024   CO2 24 01/04/2024   INR 1.2 05/19/2022       Problem List: ***  Assessment / Plan:   ***      Sheri Cooper 01/12/2024 2:12 PM

## 2024-01-13 ENCOUNTER — Ambulatory Visit
Attending: Thoracic Surgery (Cardiothoracic Vascular Surgery) | Admitting: Thoracic Surgery (Cardiothoracic Vascular Surgery)

## 2024-01-13 ENCOUNTER — Encounter: Payer: Self-pay | Admitting: Thoracic Surgery (Cardiothoracic Vascular Surgery)

## 2024-01-13 ENCOUNTER — Ambulatory Visit (HOSPITAL_COMMUNITY)
Admission: RE | Admit: 2024-01-13 | Discharge: 2024-01-13 | Disposition: A | Discharge status: Home / Self Care | Source: Ambulatory Visit | Attending: Internal Medicine | Admitting: Internal Medicine

## 2024-01-13 ENCOUNTER — Other Ambulatory Visit: Payer: Self-pay

## 2024-01-13 VITALS — BP 155/52 | HR 67 | Resp 18 | Ht 59.0 in | Wt 91.0 lb

## 2024-01-13 DIAGNOSIS — J90 Pleural effusion, not elsewhere classified: Secondary | ICD-10-CM | POA: Insufficient documentation

## 2024-01-13 DIAGNOSIS — R918 Other nonspecific abnormal finding of lung field: Secondary | ICD-10-CM | POA: Diagnosis not present

## 2024-01-13 DIAGNOSIS — Z48813 Encounter for surgical aftercare following surgery on the respiratory system: Secondary | ICD-10-CM | POA: Diagnosis not present

## 2024-01-13 DIAGNOSIS — J9811 Atelectasis: Secondary | ICD-10-CM | POA: Diagnosis not present

## 2024-01-14 DIAGNOSIS — Z992 Dependence on renal dialysis: Secondary | ICD-10-CM | POA: Diagnosis not present

## 2024-01-14 DIAGNOSIS — N186 End stage renal disease: Secondary | ICD-10-CM | POA: Diagnosis not present

## 2024-01-17 DIAGNOSIS — Z992 Dependence on renal dialysis: Secondary | ICD-10-CM | POA: Diagnosis not present

## 2024-01-17 DIAGNOSIS — N186 End stage renal disease: Secondary | ICD-10-CM | POA: Diagnosis not present

## 2024-01-19 DIAGNOSIS — N186 End stage renal disease: Secondary | ICD-10-CM | POA: Diagnosis not present

## 2024-01-19 DIAGNOSIS — Z992 Dependence on renal dialysis: Secondary | ICD-10-CM | POA: Diagnosis not present

## 2024-01-21 DIAGNOSIS — Z992 Dependence on renal dialysis: Secondary | ICD-10-CM | POA: Diagnosis not present

## 2024-01-21 DIAGNOSIS — N186 End stage renal disease: Secondary | ICD-10-CM | POA: Diagnosis not present

## 2024-01-24 DIAGNOSIS — Z992 Dependence on renal dialysis: Secondary | ICD-10-CM | POA: Diagnosis not present

## 2024-01-24 DIAGNOSIS — N186 End stage renal disease: Secondary | ICD-10-CM | POA: Diagnosis not present

## 2024-01-25 DIAGNOSIS — E7849 Other hyperlipidemia: Secondary | ICD-10-CM | POA: Diagnosis not present

## 2024-01-25 DIAGNOSIS — E782 Mixed hyperlipidemia: Secondary | ICD-10-CM | POA: Diagnosis not present

## 2024-01-25 DIAGNOSIS — Z23 Encounter for immunization: Secondary | ICD-10-CM | POA: Diagnosis not present

## 2024-01-25 DIAGNOSIS — Z992 Dependence on renal dialysis: Secondary | ICD-10-CM | POA: Diagnosis not present

## 2024-01-25 DIAGNOSIS — Z0001 Encounter for general adult medical examination with abnormal findings: Secondary | ICD-10-CM | POA: Diagnosis not present

## 2024-01-25 DIAGNOSIS — Z681 Body mass index (BMI) 19 or less, adult: Secondary | ICD-10-CM | POA: Diagnosis not present

## 2024-01-25 DIAGNOSIS — M069 Rheumatoid arthritis, unspecified: Secondary | ICD-10-CM | POA: Diagnosis not present

## 2024-01-25 DIAGNOSIS — I10 Essential (primary) hypertension: Secondary | ICD-10-CM | POA: Diagnosis not present

## 2024-01-26 DIAGNOSIS — N186 End stage renal disease: Secondary | ICD-10-CM | POA: Diagnosis not present

## 2024-01-26 DIAGNOSIS — Z992 Dependence on renal dialysis: Secondary | ICD-10-CM | POA: Diagnosis not present

## 2024-01-28 DIAGNOSIS — N186 End stage renal disease: Secondary | ICD-10-CM | POA: Diagnosis not present

## 2024-01-28 DIAGNOSIS — Z992 Dependence on renal dialysis: Secondary | ICD-10-CM | POA: Diagnosis not present

## 2024-01-31 DIAGNOSIS — Z992 Dependence on renal dialysis: Secondary | ICD-10-CM | POA: Diagnosis not present

## 2024-01-31 DIAGNOSIS — N186 End stage renal disease: Secondary | ICD-10-CM | POA: Diagnosis not present

## 2024-02-03 DIAGNOSIS — J90 Pleural effusion, not elsewhere classified: Secondary | ICD-10-CM | POA: Diagnosis not present

## 2024-02-03 DIAGNOSIS — K219 Gastro-esophageal reflux disease without esophagitis: Secondary | ICD-10-CM | POA: Diagnosis not present

## 2024-02-03 DIAGNOSIS — D649 Anemia, unspecified: Secondary | ICD-10-CM | POA: Diagnosis not present

## 2024-02-03 DIAGNOSIS — N186 End stage renal disease: Secondary | ICD-10-CM | POA: Diagnosis not present

## 2024-02-03 DIAGNOSIS — J449 Chronic obstructive pulmonary disease, unspecified: Secondary | ICD-10-CM | POA: Diagnosis not present

## 2024-02-03 DIAGNOSIS — I10 Essential (primary) hypertension: Secondary | ICD-10-CM | POA: Diagnosis not present

## 2024-02-03 DIAGNOSIS — E782 Mixed hyperlipidemia: Secondary | ICD-10-CM | POA: Diagnosis not present

## 2024-02-03 DIAGNOSIS — Z1231 Encounter for screening mammogram for malignant neoplasm of breast: Secondary | ICD-10-CM | POA: Diagnosis not present

## 2024-02-06 ENCOUNTER — Ambulatory Visit: Admitting: Internal Medicine

## 2024-02-15 ENCOUNTER — Encounter (INDEPENDENT_AMBULATORY_CARE_PROVIDER_SITE_OTHER): Payer: Self-pay | Admitting: Gastroenterology

## 2024-02-15 DIAGNOSIS — Z1231 Encounter for screening mammogram for malignant neoplasm of breast: Secondary | ICD-10-CM | POA: Diagnosis not present
